# Patient Record
Sex: Female | Born: 1975 | Race: Black or African American | Hispanic: No | Marital: Married | Smoking: Never smoker
Health system: Southern US, Community
[De-identification: ages and names within clinical notes are randomized; demographics above are authoritative.]

## PROBLEM LIST (undated history)

## (undated) ENCOUNTER — Inpatient Hospital Stay (HOSPITAL_COMMUNITY): Payer: Self-pay

## (undated) DIAGNOSIS — E119 Type 2 diabetes mellitus without complications: Secondary | ICD-10-CM

## (undated) DIAGNOSIS — Z8709 Personal history of other diseases of the respiratory system: Secondary | ICD-10-CM

## (undated) DIAGNOSIS — E049 Nontoxic goiter, unspecified: Secondary | ICD-10-CM

## (undated) DIAGNOSIS — D649 Anemia, unspecified: Secondary | ICD-10-CM

## (undated) DIAGNOSIS — A6000 Herpesviral infection of urogenital system, unspecified: Secondary | ICD-10-CM

## (undated) DIAGNOSIS — K3184 Gastroparesis: Secondary | ICD-10-CM

## (undated) DIAGNOSIS — F32A Depression, unspecified: Secondary | ICD-10-CM

## (undated) DIAGNOSIS — K219 Gastro-esophageal reflux disease without esophagitis: Secondary | ICD-10-CM

## (undated) DIAGNOSIS — I10 Essential (primary) hypertension: Secondary | ICD-10-CM

## (undated) DIAGNOSIS — F319 Bipolar disorder, unspecified: Secondary | ICD-10-CM

## (undated) DIAGNOSIS — F329 Major depressive disorder, single episode, unspecified: Secondary | ICD-10-CM

## (undated) DIAGNOSIS — Z973 Presence of spectacles and contact lenses: Secondary | ICD-10-CM

## (undated) HISTORY — DX: Type 2 diabetes mellitus without complications: E11.9

## (undated) HISTORY — PX: BREAST SURGERY: SHX581

## (undated) HISTORY — DX: Bipolar disorder, unspecified: F31.9

## (undated) HISTORY — DX: Essential (primary) hypertension: I10

## (undated) HISTORY — DX: Gastro-esophageal reflux disease without esophagitis: K21.9

## (undated) HISTORY — PX: REDUCTION MAMMAPLASTY: SUR839

---
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
## 1998-01-29 ENCOUNTER — Other Ambulatory Visit: Admission: RE | Admit: 1998-01-29 | Discharge: 1998-01-29 | Payer: Self-pay | Admitting: Obstetrics and Gynecology

## 1998-08-03 HISTORY — PX: OTHER SURGICAL HISTORY: SHX169

## 1998-11-13 ENCOUNTER — Inpatient Hospital Stay (HOSPITAL_COMMUNITY): Admission: AD | Admit: 1998-11-13 | Discharge: 1998-11-13 | Payer: Self-pay | Admitting: Obstetrics and Gynecology

## 1998-11-18 ENCOUNTER — Inpatient Hospital Stay (HOSPITAL_COMMUNITY): Admission: RE | Admit: 1998-11-18 | Discharge: 1998-11-20 | Payer: Self-pay | Admitting: Obstetrics & Gynecology

## 2003-10-30 ENCOUNTER — Ambulatory Visit (HOSPITAL_COMMUNITY): Admission: RE | Admit: 2003-10-30 | Discharge: 2003-10-30 | Payer: Self-pay | Admitting: Family Medicine

## 2004-05-05 ENCOUNTER — Emergency Department (HOSPITAL_COMMUNITY): Admission: EM | Admit: 2004-05-05 | Discharge: 2004-05-05 | Payer: Self-pay | Admitting: Emergency Medicine

## 2004-08-01 ENCOUNTER — Ambulatory Visit: Payer: Self-pay | Admitting: Family Medicine

## 2004-08-23 ENCOUNTER — Emergency Department (HOSPITAL_COMMUNITY): Admission: EM | Admit: 2004-08-23 | Discharge: 2004-08-23 | Payer: Self-pay | Admitting: Emergency Medicine

## 2004-09-01 ENCOUNTER — Ambulatory Visit (HOSPITAL_COMMUNITY): Payer: Self-pay | Admitting: Psychiatry

## 2004-09-11 ENCOUNTER — Ambulatory Visit: Payer: Self-pay | Admitting: Family Medicine

## 2004-09-23 ENCOUNTER — Ambulatory Visit: Payer: Self-pay | Admitting: Family Medicine

## 2004-11-03 ENCOUNTER — Ambulatory Visit: Payer: Self-pay | Admitting: Family Medicine

## 2004-11-24 ENCOUNTER — Ambulatory Visit: Payer: Self-pay | Admitting: Family Medicine

## 2005-01-09 ENCOUNTER — Ambulatory Visit (HOSPITAL_COMMUNITY): Payer: Self-pay | Admitting: Psychiatry

## 2005-01-09 ENCOUNTER — Ambulatory Visit: Payer: Self-pay | Admitting: Psychiatry

## 2005-02-02 ENCOUNTER — Ambulatory Visit: Payer: Self-pay | Admitting: Family Medicine

## 2005-03-02 ENCOUNTER — Ambulatory Visit (HOSPITAL_COMMUNITY): Payer: Self-pay | Admitting: Psychiatry

## 2005-05-05 ENCOUNTER — Ambulatory Visit: Payer: Self-pay | Admitting: Family Medicine

## 2005-05-18 ENCOUNTER — Ambulatory Visit: Payer: Self-pay | Admitting: Family Medicine

## 2005-05-20 ENCOUNTER — Ambulatory Visit (HOSPITAL_COMMUNITY): Payer: Self-pay | Admitting: Psychiatry

## 2005-06-16 ENCOUNTER — Ambulatory Visit: Payer: Self-pay | Admitting: Family Medicine

## 2005-08-10 ENCOUNTER — Ambulatory Visit (HOSPITAL_COMMUNITY): Payer: Self-pay | Admitting: Psychiatry

## 2005-08-25 ENCOUNTER — Ambulatory Visit: Payer: Self-pay | Admitting: Family Medicine

## 2005-09-04 ENCOUNTER — Ambulatory Visit: Payer: Self-pay | Admitting: Family Medicine

## 2005-09-07 ENCOUNTER — Ambulatory Visit: Payer: Self-pay | Admitting: Family Medicine

## 2005-11-18 ENCOUNTER — Ambulatory Visit (HOSPITAL_COMMUNITY): Payer: Self-pay | Admitting: Psychiatry

## 2005-12-01 ENCOUNTER — Ambulatory Visit: Payer: Self-pay | Admitting: Family Medicine

## 2006-02-24 ENCOUNTER — Ambulatory Visit (HOSPITAL_COMMUNITY): Payer: Self-pay | Admitting: Psychiatry

## 2006-06-25 ENCOUNTER — Inpatient Hospital Stay (HOSPITAL_COMMUNITY): Admission: AD | Admit: 2006-06-25 | Discharge: 2006-06-25 | Payer: Self-pay | Admitting: Obstetrics

## 2006-07-02 ENCOUNTER — Inpatient Hospital Stay (HOSPITAL_COMMUNITY): Admission: AD | Admit: 2006-07-02 | Discharge: 2006-07-11 | Payer: Self-pay | Admitting: Obstetrics & Gynecology

## 2006-07-07 ENCOUNTER — Encounter (INDEPENDENT_AMBULATORY_CARE_PROVIDER_SITE_OTHER): Payer: Self-pay | Admitting: Specialist

## 2006-07-19 ENCOUNTER — Ambulatory Visit: Payer: Self-pay | Admitting: Family Medicine

## 2006-08-05 ENCOUNTER — Ambulatory Visit: Payer: Self-pay | Admitting: Family Medicine

## 2006-08-11 ENCOUNTER — Ambulatory Visit (HOSPITAL_COMMUNITY): Payer: Self-pay | Admitting: Psychiatry

## 2006-09-06 ENCOUNTER — Ambulatory Visit: Payer: Self-pay | Admitting: Family Medicine

## 2006-09-09 ENCOUNTER — Ambulatory Visit (HOSPITAL_COMMUNITY): Admission: RE | Admit: 2006-09-09 | Discharge: 2006-09-09 | Payer: Self-pay | Admitting: Family Medicine

## 2006-09-09 ENCOUNTER — Ambulatory Visit: Payer: Self-pay | Admitting: Family Medicine

## 2006-09-15 ENCOUNTER — Ambulatory Visit (HOSPITAL_COMMUNITY): Payer: Self-pay | Admitting: Psychiatry

## 2006-10-06 ENCOUNTER — Ambulatory Visit (HOSPITAL_COMMUNITY): Payer: Self-pay | Admitting: Psychiatry

## 2006-10-18 ENCOUNTER — Ambulatory Visit: Payer: Self-pay | Admitting: Family Medicine

## 2006-10-18 LAB — CONVERTED CEMR LAB
BUN: 12 mg/dL (ref 6–23)
Basophils Absolute: 0 10*3/uL (ref 0.0–0.1)
Basophils Relative: 0 % (ref 0–1)
CO2: 25 meq/L (ref 19–32)
Calcium: 10 mg/dL (ref 8.4–10.5)
Chloride: 99 meq/L (ref 96–112)
Creatinine, Ser: 0.81 mg/dL (ref 0.40–1.20)
Eosinophils Absolute: 0.1 10*3/uL (ref 0.0–0.7)
Eosinophils Relative: 1 % (ref 0–5)
Ferritin: 8 ng/mL — ABNORMAL LOW (ref 10–291)
Folate: >20 ng/mL
Glucose, Bld: 134 mg/dL — ABNORMAL HIGH (ref 70–99)
HCT: 37.9 % (ref 36.0–46.0)
Hemoglobin: 12.6 g/dL (ref 12.0–15.0)
Hgb A1c MFr Bld: 7.7 % — ABNORMAL HIGH (ref 4.6–6.1)
Iron: 207 ug/dL — ABNORMAL HIGH (ref 42–145)
Lymphocytes Relative: 48 % — ABNORMAL HIGH (ref 12–46)
Lymphs Abs: 2.5 10*3/uL (ref 0.7–3.3)
MCHC: 33.2 g/dL (ref 30.0–36.0)
MCV: 89.6 fL (ref 78.0–100.0)
Monocytes Absolute: 0.3 10*3/uL (ref 0.2–0.7)
Monocytes Relative: 6 % (ref 3–11)
Neutro Abs: 2.4 10*3/uL (ref 1.7–7.7)
Neutrophils Relative %: 45 % (ref 43–77)
Pap Smear: NORMAL
Platelets: 443 10*3/uL — ABNORMAL HIGH (ref 150–400)
Potassium: 4.1 meq/L (ref 3.5–5.3)
RBC: 4.23 M/uL (ref 3.87–5.11)
RDW: 15.3 % — ABNORMAL HIGH (ref 11.5–14.0)
Retic Count, Absolute: 42.3 (ref 19.0–186.0)
Retic Ct Pct: 1 % (ref 0.4–3.1)
Saturation Ratios: 34 % (ref 20–55)
Sodium: 138 meq/L (ref 135–145)
TIBC: 616 ug/dL — ABNORMAL HIGH (ref 250–470)
UIBC: 409 ug/dL
Vitamin B-12: 365 pg/mL (ref 211–911)
WBC: 5.3 10*3/uL (ref 4.0–10.5)

## 2006-11-17 ENCOUNTER — Ambulatory Visit (HOSPITAL_COMMUNITY): Payer: Self-pay | Admitting: Psychiatry

## 2006-11-24 ENCOUNTER — Ambulatory Visit: Payer: Self-pay | Admitting: Family Medicine

## 2006-11-25 ENCOUNTER — Encounter: Payer: Self-pay | Admitting: Family Medicine

## 2006-12-13 ENCOUNTER — Ambulatory Visit: Payer: Self-pay | Admitting: Family Medicine

## 2006-12-20 ENCOUNTER — Ambulatory Visit: Payer: Self-pay | Admitting: Family Medicine

## 2006-12-28 ENCOUNTER — Ambulatory Visit: Payer: Self-pay | Admitting: Family Medicine

## 2007-01-03 ENCOUNTER — Ambulatory Visit: Payer: Self-pay | Admitting: Family Medicine

## 2007-01-17 ENCOUNTER — Ambulatory Visit: Payer: Self-pay | Admitting: Family Medicine

## 2007-01-18 ENCOUNTER — Encounter: Payer: Self-pay | Admitting: Family Medicine

## 2007-01-18 LAB — CONVERTED CEMR LAB
BUN: 11 mg/dL (ref 6–23)
Basophils Absolute: 0 10*3/uL (ref 0.0–0.1)
Basophils Relative: 0 % (ref 0–1)
CO2: 25 meq/L (ref 19–32)
Calcium: 9.9 mg/dL (ref 8.4–10.5)
Chloride: 100 meq/L (ref 96–112)
Creatinine, Ser: 0.92 mg/dL (ref 0.40–1.20)
Eosinophils Absolute: 0.1 10*3/uL (ref 0.0–0.7)
Eosinophils Relative: 1 % (ref 0–5)
Ferritin: 5 ng/mL — ABNORMAL LOW (ref 10–291)
Folate: >20 ng/mL
Glucose, Bld: 91 mg/dL (ref 70–99)
HCT: 39.3 % (ref 36.0–46.0)
Hemoglobin: 12.4 g/dL (ref 12.0–15.0)
Hgb A1c MFr Bld: 6.3 % — ABNORMAL HIGH (ref 4.6–6.1)
Iron: 106 ug/dL (ref 42–145)
Lymphocytes Relative: 46 % (ref 12–46)
Lymphs Abs: 2.3 10*3/uL (ref 0.7–3.3)
MCHC: 31.6 g/dL (ref 30.0–36.0)
MCV: 96.3 fL (ref 78.0–100.0)
Monocytes Absolute: 0.3 10*3/uL (ref 0.2–0.7)
Monocytes Relative: 5 % (ref 3–11)
Neutro Abs: 2.4 10*3/uL (ref 1.7–7.7)
Neutrophils Relative %: 47 % (ref 43–77)
Platelets: 362 10*3/uL (ref 150–400)
Potassium: 4 meq/L (ref 3.5–5.3)
RBC: 4.08 M/uL (ref 3.87–5.11)
RDW: 14.3 % — ABNORMAL HIGH (ref 11.5–14.0)
Retic Count, Absolute: 44.9 (ref 19.0–186.0)
Retic Ct Pct: 1.1 % (ref 0.4–3.1)
Saturation Ratios: 20 % (ref 20–55)
Sodium: 138 meq/L (ref 135–145)
TIBC: 525 ug/dL — ABNORMAL HIGH (ref 250–470)
UIBC: 419 ug/dL
Vitamin B-12: 289 pg/mL (ref 211–911)
WBC: 5 10*3/uL (ref 4.0–10.5)

## 2007-01-19 ENCOUNTER — Ambulatory Visit (HOSPITAL_COMMUNITY): Payer: Self-pay | Admitting: Psychiatry

## 2007-02-03 ENCOUNTER — Ambulatory Visit: Payer: Self-pay | Admitting: Family Medicine

## 2007-02-14 ENCOUNTER — Ambulatory Visit: Payer: Self-pay | Admitting: Family Medicine

## 2007-02-21 ENCOUNTER — Ambulatory Visit: Payer: Self-pay | Admitting: Family Medicine

## 2007-04-13 ENCOUNTER — Ambulatory Visit (HOSPITAL_COMMUNITY): Payer: Self-pay | Admitting: Psychiatry

## 2007-04-29 ENCOUNTER — Ambulatory Visit: Payer: Self-pay | Admitting: Family Medicine

## 2007-05-11 ENCOUNTER — Ambulatory Visit: Payer: Self-pay | Admitting: Family Medicine

## 2007-07-12 ENCOUNTER — Ambulatory Visit: Payer: Self-pay | Admitting: Family Medicine

## 2007-08-22 ENCOUNTER — Ambulatory Visit: Payer: Self-pay | Admitting: Family Medicine

## 2007-09-16 ENCOUNTER — Ambulatory Visit (HOSPITAL_COMMUNITY): Payer: Self-pay | Admitting: Psychiatry

## 2007-10-06 ENCOUNTER — Ambulatory Visit: Payer: Self-pay | Admitting: Family Medicine

## 2007-10-08 ENCOUNTER — Encounter: Payer: Self-pay | Admitting: Family Medicine

## 2007-10-08 LAB — CONVERTED CEMR LAB
BUN: 9 mg/dL (ref 6–23)
CO2: 23 meq/L (ref 19–32)
Calcium: 9.1 mg/dL (ref 8.4–10.5)
Chloride: 105 meq/L (ref 96–112)
Cholesterol: 110 mg/dL (ref 0–200)
Creatinine, Ser: 1.14 mg/dL (ref 0.40–1.20)
Glucose, Bld: 96 mg/dL (ref 70–99)
HDL: 51 mg/dL (ref >39)
LDL Cholesterol: 40 mg/dL (ref 0–99)
Potassium: 4.2 meq/L (ref 3.5–5.3)
Sodium: 140 meq/L (ref 135–145)
Total CHOL/HDL Ratio: 2.2
Triglycerides: 95 mg/dL (ref <150)
VLDL: 19 mg/dL (ref 0–40)

## 2007-12-12 ENCOUNTER — Ambulatory Visit (HOSPITAL_COMMUNITY): Payer: Self-pay | Admitting: Psychiatry

## 2008-01-05 ENCOUNTER — Encounter: Payer: Self-pay | Admitting: Family Medicine

## 2008-01-05 DIAGNOSIS — F319 Bipolar disorder, unspecified: Secondary | ICD-10-CM | POA: Insufficient documentation

## 2008-01-05 DIAGNOSIS — I10 Essential (primary) hypertension: Secondary | ICD-10-CM | POA: Insufficient documentation

## 2008-01-05 DIAGNOSIS — O99210 Obesity complicating pregnancy, unspecified trimester: Secondary | ICD-10-CM | POA: Insufficient documentation

## 2008-01-09 ENCOUNTER — Ambulatory Visit: Payer: Self-pay | Admitting: Family Medicine

## 2008-01-10 ENCOUNTER — Encounter: Payer: Self-pay | Admitting: Family Medicine

## 2008-01-10 LAB — CONVERTED CEMR LAB: Microalb, Ur: 17.7 mg/dL — ABNORMAL HIGH (ref 0.00–1.89)

## 2008-01-19 ENCOUNTER — Ambulatory Visit (HOSPITAL_COMMUNITY): Admission: RE | Admit: 2008-01-19 | Discharge: 2008-01-19 | Payer: Self-pay | Admitting: Family Medicine

## 2008-04-12 ENCOUNTER — Ambulatory Visit: Payer: Self-pay | Admitting: Family Medicine

## 2008-04-23 ENCOUNTER — Ambulatory Visit (HOSPITAL_COMMUNITY): Payer: Self-pay | Admitting: Psychiatry

## 2008-06-18 ENCOUNTER — Ambulatory Visit (HOSPITAL_COMMUNITY): Payer: Self-pay | Admitting: Psychiatry

## 2008-07-04 ENCOUNTER — Ambulatory Visit: Payer: Self-pay | Admitting: Family Medicine

## 2008-07-04 LAB — CONVERTED CEMR LAB
Glucose, Bld: 102 mg/dL
Hgb A1c MFr Bld: 6.2 %

## 2008-08-20 ENCOUNTER — Encounter: Payer: Self-pay | Admitting: Family Medicine

## 2008-08-27 ENCOUNTER — Ambulatory Visit (HOSPITAL_COMMUNITY): Payer: Self-pay | Admitting: Psychiatry

## 2008-08-30 ENCOUNTER — Encounter: Payer: Self-pay | Admitting: Family Medicine

## 2008-08-30 ENCOUNTER — Telehealth: Payer: Self-pay | Admitting: Family Medicine

## 2008-09-13 ENCOUNTER — Ambulatory Visit: Payer: Self-pay | Admitting: Family Medicine

## 2008-09-13 LAB — CONVERTED CEMR LAB: Glucose, Bld: 126 mg/dL

## 2008-09-18 ENCOUNTER — Telehealth: Payer: Self-pay | Admitting: Family Medicine

## 2008-09-19 ENCOUNTER — Ambulatory Visit: Payer: Self-pay | Admitting: Family Medicine

## 2008-09-21 ENCOUNTER — Ambulatory Visit (HOSPITAL_COMMUNITY): Admission: RE | Admit: 2008-09-21 | Discharge: 2008-09-21 | Payer: Self-pay | Admitting: Family Medicine

## 2008-09-27 ENCOUNTER — Encounter (HOSPITAL_COMMUNITY): Admission: RE | Admit: 2008-09-27 | Discharge: 2008-10-27 | Payer: Self-pay | Admitting: Family Medicine

## 2008-10-13 ENCOUNTER — Encounter: Payer: Self-pay | Admitting: Family Medicine

## 2008-10-13 LAB — CONVERTED CEMR LAB
ALT: 11 units/L (ref 0–35)
AST: 11 units/L (ref 0–37)
Albumin: 4.6 g/dL (ref 3.5–5.2)
Alkaline Phosphatase: 51 units/L (ref 39–117)
BUN: 11 mg/dL (ref 6–23)
Band Neutrophils: 0 % (ref 0–10)
Basophils Absolute: 0 10*3/uL (ref 0.0–0.1)
Basophils Relative: 0 % (ref 0–1)
Bilirubin, Direct: 0.1 mg/dL (ref 0.0–0.3)
CO2: 25 meq/L (ref 19–32)
Calcium: 9.4 mg/dL (ref 8.4–10.5)
Chloride: 103 meq/L (ref 96–112)
Cholesterol: 124 mg/dL (ref 0–200)
Creatinine, Ser: 1.05 mg/dL (ref 0.40–1.20)
Eosinophils Absolute: 0.1 10*3/uL (ref 0.0–0.7)
Eosinophils Relative: 3 % (ref 0–5)
Glucose, Bld: 121 mg/dL — ABNORMAL HIGH (ref 70–99)
HCT: 35.6 % — ABNORMAL LOW (ref 36.0–46.0)
HDL: 56 mg/dL (ref >39)
Hemoglobin: 12.1 g/dL (ref 12.0–15.0)
Indirect Bilirubin: 0.3 mg/dL (ref 0.0–0.9)
LDL Cholesterol: 45 mg/dL (ref 0–99)
Lymphocytes Relative: 43 % (ref 12–46)
Lymphs Abs: 1.9 10*3/uL (ref 0.7–4.0)
MCHC: 34 g/dL (ref 30.0–36.0)
MCV: 93.9 fL (ref 78.0–100.0)
Monocytes Absolute: 0.3 10*3/uL (ref 0.1–1.0)
Monocytes Relative: 8 % (ref 3–12)
Neutro Abs: 2 10*3/uL (ref 1.7–7.7)
Neutrophils Relative %: 47 % (ref 43–77)
Platelets: 360 10*3/uL (ref 150–400)
Potassium: 3.9 meq/L (ref 3.5–5.3)
RBC: 3.79 M/uL — ABNORMAL LOW (ref 3.87–5.11)
RDW: 13.4 % (ref 11.5–15.5)
Sodium: 138 meq/L (ref 135–145)
TSH: 1.44 microintl units/mL (ref 0.350–4.500)
Total Bilirubin: 0.4 mg/dL (ref 0.3–1.2)
Total CHOL/HDL Ratio: 2.2
Total Protein: 7.2 g/dL (ref 6.0–8.3)
Triglycerides: 116 mg/dL (ref <150)
VLDL: 23 mg/dL (ref 0–40)
WBC: 4.3 10*3/uL (ref 4.0–10.5)

## 2008-10-18 ENCOUNTER — Ambulatory Visit: Payer: Self-pay | Admitting: Family Medicine

## 2008-10-18 LAB — CONVERTED CEMR LAB
Glucose, Bld: 148 mg/dL
Hgb A1c MFr Bld: 7.1 %

## 2008-11-19 ENCOUNTER — Ambulatory Visit (HOSPITAL_COMMUNITY): Payer: Self-pay | Admitting: Psychiatry

## 2008-11-29 ENCOUNTER — Ambulatory Visit: Payer: Self-pay | Admitting: Family Medicine

## 2008-11-29 LAB — CONVERTED CEMR LAB: Glucose, Bld: 156 mg/dL

## 2008-12-04 LAB — CONVERTED CEMR LAB: Helicobacter Pylori Antibody-IgG: <0.4

## 2009-01-23 ENCOUNTER — Ambulatory Visit: Payer: Self-pay | Admitting: Family Medicine

## 2009-01-23 LAB — CONVERTED CEMR LAB
Glucose, Bld: 101 mg/dL
Hgb A1c MFr Bld: 6.4 %

## 2009-01-25 ENCOUNTER — Encounter: Payer: Self-pay | Admitting: Family Medicine

## 2009-01-25 LAB — CONVERTED CEMR LAB
Creatinine, Urine: 233.8 mg/dL
Microalb Creat Ratio: 18.9 mg/g (ref 0.0–30.0)
Microalb, Ur: 4.42 mg/dL — ABNORMAL HIGH (ref 0.00–1.89)

## 2009-01-27 DIAGNOSIS — K219 Gastro-esophageal reflux disease without esophagitis: Secondary | ICD-10-CM | POA: Insufficient documentation

## 2009-02-06 ENCOUNTER — Ambulatory Visit: Payer: Self-pay | Admitting: Family Medicine

## 2009-02-25 ENCOUNTER — Ambulatory Visit: Payer: Self-pay | Admitting: Family Medicine

## 2009-02-25 LAB — CONVERTED CEMR LAB
Basophils Absolute: 0 10*3/uL (ref 0.0–0.1)
Basophils Relative: 0 % (ref 0–1)
Eosinophils Absolute: 0.1 10*3/uL (ref 0.0–0.7)
Eosinophils Relative: 1 % (ref 0–5)
Glucose, Bld: 134 mg/dL
HCT: 38.3 % (ref 36.0–46.0)
Hemoglobin: 13.2 g/dL (ref 12.0–15.0)
Lymphocytes Relative: 17 % (ref 12–46)
Lymphs Abs: 1.5 10*3/uL (ref 0.7–4.0)
MCHC: 34.6 g/dL (ref 30.0–36.0)
MCV: 95.5 fL (ref 78.0–100.0)
Monocytes Absolute: 0.7 10*3/uL (ref 0.1–1.0)
Monocytes Relative: 8 % (ref 3–12)
Neutro Abs: 6.4 10*3/uL (ref 1.7–7.7)
Neutrophils Relative %: 74 % (ref 43–77)
Platelets: 370 10*3/uL (ref 150–400)
RBC: 4.01 M/uL (ref 3.87–5.11)
RDW: 13.6 % (ref 11.5–15.5)
WBC: 8.7 10*3/uL (ref 4.0–10.5)

## 2009-02-26 ENCOUNTER — Telehealth: Payer: Self-pay | Admitting: Family Medicine

## 2009-02-28 ENCOUNTER — Telehealth: Payer: Self-pay | Admitting: Family Medicine

## 2009-03-01 ENCOUNTER — Ambulatory Visit (HOSPITAL_COMMUNITY): Admission: RE | Admit: 2009-03-01 | Discharge: 2009-03-01 | Payer: Self-pay | Admitting: Family Medicine

## 2009-04-19 ENCOUNTER — Ambulatory Visit (HOSPITAL_COMMUNITY): Payer: Self-pay | Admitting: Psychiatry

## 2009-04-30 ENCOUNTER — Ambulatory Visit: Payer: Self-pay | Admitting: Family Medicine

## 2009-04-30 LAB — CONVERTED CEMR LAB
Glucose, Bld: 128 mg/dL
Hgb A1c MFr Bld: 6.6 %

## 2009-05-08 ENCOUNTER — Encounter (INDEPENDENT_AMBULATORY_CARE_PROVIDER_SITE_OTHER): Payer: Self-pay | Admitting: *Deleted

## 2009-05-20 ENCOUNTER — Encounter: Payer: Self-pay | Admitting: Family Medicine

## 2009-06-03 ENCOUNTER — Ambulatory Visit: Payer: Self-pay | Admitting: Gastroenterology

## 2009-06-03 DIAGNOSIS — R1319 Other dysphagia: Secondary | ICD-10-CM | POA: Insufficient documentation

## 2009-06-03 DIAGNOSIS — N912 Amenorrhea, unspecified: Secondary | ICD-10-CM | POA: Insufficient documentation

## 2009-06-10 ENCOUNTER — Ambulatory Visit: Payer: Self-pay | Admitting: Internal Medicine

## 2009-07-01 ENCOUNTER — Ambulatory Visit: Payer: Self-pay | Admitting: Gastroenterology

## 2009-07-01 ENCOUNTER — Ambulatory Visit (HOSPITAL_COMMUNITY): Admission: RE | Admit: 2009-07-01 | Discharge: 2009-07-01 | Payer: Self-pay | Admitting: Gastroenterology

## 2009-07-01 HISTORY — PX: ESOPHAGOGASTRODUODENOSCOPY: SHX1529

## 2009-07-10 ENCOUNTER — Encounter: Payer: Self-pay | Admitting: Gastroenterology

## 2009-07-10 ENCOUNTER — Encounter: Payer: Self-pay | Admitting: Internal Medicine

## 2009-07-16 ENCOUNTER — Encounter (HOSPITAL_COMMUNITY): Admission: RE | Admit: 2009-07-16 | Discharge: 2009-07-31 | Payer: Self-pay | Admitting: Gastroenterology

## 2009-07-24 ENCOUNTER — Telehealth: Payer: Self-pay | Admitting: Gastroenterology

## 2009-08-09 ENCOUNTER — Telehealth (INDEPENDENT_AMBULATORY_CARE_PROVIDER_SITE_OTHER): Payer: Self-pay

## 2009-08-15 ENCOUNTER — Encounter: Payer: Self-pay | Admitting: Internal Medicine

## 2009-09-02 ENCOUNTER — Ambulatory Visit: Payer: Self-pay | Admitting: Family Medicine

## 2009-09-02 ENCOUNTER — Ambulatory Visit: Payer: Self-pay | Admitting: Gastroenterology

## 2009-09-02 DIAGNOSIS — K3184 Gastroparesis: Secondary | ICD-10-CM | POA: Insufficient documentation

## 2009-09-02 LAB — CONVERTED CEMR LAB
Glucose, Bld: 100 mg/dL
Hgb A1c MFr Bld: 6.4 %

## 2009-09-03 ENCOUNTER — Telehealth: Payer: Self-pay | Admitting: Family Medicine

## 2009-09-04 ENCOUNTER — Telehealth: Payer: Self-pay | Admitting: Family Medicine

## 2009-09-11 ENCOUNTER — Encounter: Payer: Self-pay | Admitting: Family Medicine

## 2009-09-13 ENCOUNTER — Telehealth: Payer: Self-pay | Admitting: Family Medicine

## 2009-09-13 ENCOUNTER — Emergency Department (HOSPITAL_COMMUNITY): Admission: EM | Admit: 2009-09-13 | Discharge: 2009-09-13 | Payer: Self-pay | Admitting: Emergency Medicine

## 2009-09-16 ENCOUNTER — Ambulatory Visit: Payer: Self-pay | Admitting: Family Medicine

## 2009-09-16 DIAGNOSIS — M5380 Other specified dorsopathies, site unspecified: Secondary | ICD-10-CM | POA: Insufficient documentation

## 2009-09-16 DIAGNOSIS — S139XXA Sprain of joints and ligaments of unspecified parts of neck, initial encounter: Secondary | ICD-10-CM | POA: Insufficient documentation

## 2009-09-17 ENCOUNTER — Telehealth: Payer: Self-pay | Admitting: Family Medicine

## 2009-09-19 ENCOUNTER — Encounter: Payer: Self-pay | Admitting: Family Medicine

## 2009-09-24 ENCOUNTER — Telehealth: Payer: Self-pay | Admitting: Physician Assistant

## 2009-09-24 ENCOUNTER — Encounter: Payer: Self-pay | Admitting: Family Medicine

## 2009-09-25 ENCOUNTER — Encounter (HOSPITAL_COMMUNITY): Admission: RE | Admit: 2009-09-25 | Discharge: 2009-10-25 | Payer: Self-pay | Admitting: Sports Medicine

## 2009-09-27 ENCOUNTER — Encounter: Payer: Self-pay | Admitting: Family Medicine

## 2009-10-01 ENCOUNTER — Ambulatory Visit: Payer: Self-pay | Admitting: Family Medicine

## 2009-10-01 DIAGNOSIS — T7840XA Allergy, unspecified, initial encounter: Secondary | ICD-10-CM | POA: Insufficient documentation

## 2009-10-01 DIAGNOSIS — J329 Chronic sinusitis, unspecified: Secondary | ICD-10-CM | POA: Insufficient documentation

## 2009-10-02 ENCOUNTER — Ambulatory Visit (HOSPITAL_COMMUNITY): Payer: Self-pay | Admitting: Psychiatry

## 2009-10-28 ENCOUNTER — Encounter (HOSPITAL_COMMUNITY): Admission: RE | Admit: 2009-10-28 | Discharge: 2009-11-27 | Payer: Self-pay | Admitting: Sports Medicine

## 2009-10-31 ENCOUNTER — Encounter: Payer: Self-pay | Admitting: Gastroenterology

## 2009-12-02 ENCOUNTER — Telehealth: Payer: Self-pay | Admitting: Family Medicine

## 2009-12-04 LAB — CONVERTED CEMR LAB
BUN: 10 mg/dL (ref 6–23)
CO2: 26 meq/L (ref 19–32)
Calcium: 9.5 mg/dL (ref 8.4–10.5)
Chloride: 103 meq/L (ref 96–112)
Cholesterol: 152 mg/dL (ref 0–200)
Creatinine, Ser: 1.01 mg/dL (ref 0.40–1.20)
Glucose, Bld: 118 mg/dL — ABNORMAL HIGH (ref 70–99)
HDL: 66 mg/dL (ref >39)
LDL Cholesterol: 74 mg/dL (ref 0–99)
Potassium: 4.1 meq/L (ref 3.5–5.3)
Sodium: 140 meq/L (ref 135–145)
TSH: 1.15 microintl units/mL (ref 0.350–4.500)
Total CHOL/HDL Ratio: 2.3
Triglycerides: 60 mg/dL (ref <150)
VLDL: 12 mg/dL (ref 0–40)

## 2009-12-23 ENCOUNTER — Ambulatory Visit: Payer: Self-pay | Admitting: Internal Medicine

## 2010-01-08 ENCOUNTER — Telehealth: Payer: Self-pay | Admitting: Family Medicine

## 2010-01-28 ENCOUNTER — Ambulatory Visit: Payer: Self-pay | Admitting: Family Medicine

## 2010-01-31 ENCOUNTER — Ambulatory Visit (HOSPITAL_COMMUNITY): Admission: RE | Admit: 2010-01-31 | Discharge: 2010-01-31 | Payer: Self-pay | Admitting: Family Medicine

## 2010-02-05 ENCOUNTER — Ambulatory Visit (HOSPITAL_COMMUNITY): Payer: Self-pay | Admitting: Psychiatry

## 2010-02-18 ENCOUNTER — Ambulatory Visit: Payer: Self-pay | Admitting: Family Medicine

## 2010-02-18 DIAGNOSIS — R5381 Other malaise: Secondary | ICD-10-CM | POA: Insufficient documentation

## 2010-02-18 DIAGNOSIS — R5383 Other fatigue: Secondary | ICD-10-CM

## 2010-02-19 LAB — CONVERTED CEMR LAB: Hgb A1c MFr Bld: 6.8 % — ABNORMAL HIGH (ref <5.7)

## 2010-03-03 ENCOUNTER — Encounter: Payer: Self-pay | Admitting: Family Medicine

## 2010-04-02 ENCOUNTER — Encounter: Payer: Self-pay | Admitting: Family Medicine

## 2010-05-09 ENCOUNTER — Ambulatory Visit (HOSPITAL_COMMUNITY): Payer: Self-pay | Admitting: Psychiatry

## 2010-05-22 ENCOUNTER — Encounter (INDEPENDENT_AMBULATORY_CARE_PROVIDER_SITE_OTHER): Payer: Self-pay | Admitting: *Deleted

## 2010-05-26 ENCOUNTER — Encounter: Payer: Self-pay | Admitting: Family Medicine

## 2010-05-29 ENCOUNTER — Encounter: Payer: Self-pay | Admitting: Family Medicine

## 2010-05-30 ENCOUNTER — Encounter: Payer: Self-pay | Admitting: Family Medicine

## 2010-06-17 ENCOUNTER — Ambulatory Visit: Payer: Self-pay | Admitting: Gastroenterology

## 2010-06-17 LAB — CONVERTED CEMR LAB
BUN: 11 mg/dL (ref 6–23)
Basophils Absolute: 0 10*3/uL (ref 0.0–0.1)
Basophils Relative: 1 % (ref 0–1)
CO2: 28 meq/L (ref 19–32)
Calcium: 10.4 mg/dL (ref 8.4–10.5)
Chloride: 101 meq/L (ref 96–112)
Creatinine, Ser: 1.02 mg/dL (ref 0.40–1.20)
Eosinophils Absolute: 0.1 10*3/uL (ref 0.0–0.7)
Eosinophils Relative: 1 % (ref 0–5)
Glucose, Bld: 105 mg/dL — ABNORMAL HIGH (ref 70–99)
HCT: 36 % (ref 36.0–46.0)
Hemoglobin: 11.6 g/dL — ABNORMAL LOW (ref 12.0–15.0)
Hgb A1c MFr Bld: 6.4 % — ABNORMAL HIGH (ref <5.7)
Lymphocytes Relative: 52 % — ABNORMAL HIGH (ref 12–46)
Lymphs Abs: 2.3 10*3/uL (ref 0.7–4.0)
MCHC: 32.2 g/dL (ref 30.0–36.0)
MCV: 94 fL (ref 78.0–100.0)
Monocytes Absolute: 0.3 10*3/uL (ref 0.1–1.0)
Monocytes Relative: 6 % (ref 3–12)
Neutro Abs: 1.7 10*3/uL (ref 1.7–7.7)
Neutrophils Relative %: 40 % — ABNORMAL LOW (ref 43–77)
Platelets: 389 10*3/uL (ref 150–400)
Potassium: 4.2 meq/L (ref 3.5–5.3)
RBC: 3.83 M/uL — ABNORMAL LOW (ref 3.87–5.11)
RDW: 14.5 % (ref 11.5–15.5)
Sodium: 139 meq/L (ref 135–145)
WBC: 4.4 10*3/uL (ref 4.0–10.5)

## 2010-06-20 ENCOUNTER — Ambulatory Visit: Payer: Self-pay | Admitting: Family Medicine

## 2010-06-20 DIAGNOSIS — E049 Nontoxic goiter, unspecified: Secondary | ICD-10-CM | POA: Insufficient documentation

## 2010-06-24 ENCOUNTER — Telehealth: Payer: Self-pay | Admitting: Family Medicine

## 2010-06-24 ENCOUNTER — Ambulatory Visit (HOSPITAL_COMMUNITY)
Admission: RE | Admit: 2010-06-24 | Discharge: 2010-06-24 | Payer: Self-pay | Source: Home / Self Care | Admitting: Family Medicine

## 2010-06-25 ENCOUNTER — Encounter: Payer: Self-pay | Admitting: Family Medicine

## 2010-07-07 ENCOUNTER — Ambulatory Visit (HOSPITAL_COMMUNITY): Payer: Self-pay | Admitting: Psychiatry

## 2010-07-10 ENCOUNTER — Telehealth: Payer: Self-pay | Admitting: Family Medicine

## 2010-09-03 ENCOUNTER — Ambulatory Visit (HOSPITAL_COMMUNITY): Admit: 2010-09-03 | Payer: Self-pay | Admitting: Psychiatry

## 2010-09-03 ENCOUNTER — Encounter (HOSPITAL_COMMUNITY): Payer: Self-pay | Admitting: Psychiatry

## 2010-09-04 NOTE — Assessment & Plan Note (Signed)
Summary: GASTROPARESIS, GERD   Visit Type:  Follow-up Visit Primary Care Provider:  Lodema Hong, M.D.  Chief Complaint:  follow up- still vomiting.  History of Present Illness: Vomiting more, but not following her diet. No heartburn or indigestion. OMP takes care of her Sx. Taking Reglan 1 or 2 times a day. Lost 3-4 lbs. Nausea: 5 out of 7 days, hours, can keep her awake.   Current Medications (verified): 1)  Metformin Hcl 1000 Mg  Tabs (Metformin Hcl) .... One Tab By Mouth Two Times A Day 2)  Abilify 10 Mg Tabs (Aripiprazole) .... One Tab By Mouth Qhs 3)  Wellbutrin Xl 300 Mg Xr24h-Tab (Bupropion Hcl) .... One Tab By Mouth Every Morning 4)  Labetalol Hcl 300 Mg Tabs (Labetalol Hcl) .... Take 2 Tabs By Mouth Two Times A Day 5)  Glipizide 5 Mg Xr24h-Tab (Glipizide) .... One Tab By Mouth Qd 6)  Omeprazole 40 Mg Cpdr (Omeprazole) .... Take 1 Capsule By Mouth Once A Day 7)  Reglan 5 Mg Tabs (Metoclopramide Hcl) .Marland Kitchen.. 1 By Mouth 30 Minute Prior To Breakfast and Lunch 8)  Inh .... Once Daily 9)  Hydrochlorothiazide 25 Mg Tabs (Hydrochlorothiazide) .... Once Daily  Allergies (verified): 1)  ! Augmentin  Past History:  Past Medical History: Last updated: 02/18/2010 Current Problems:  BIPOLAR DISORDER UNSPECIFIED (ICD-296.80) OBESITY (ICD-278.00) HYPERTENSION (ICD-401.9) DIABETES MELLITUS, TYPE I (ICD-250.01) GERD POSITIVE PPD , will start treatment in July 2011  Past Surgical History: Last updated: 01/05/2008 Removal of dermoid tumor (2000) Breast reduction (1994)  Family History: Last updated: 06/03/2009 Mom fibromyalgie HTN, Dm Dad HTN,DM siblings 0 Second cousin, colon cancer, over age 53 No FH of liver dz, IBD, chronic GI illnesses.  Social History: got laid off Married 1 child, 3 yo Never Smoked Alcohol use-no Drug use-no  Vital Signs:  Patient profile:   35 year old female Menstrual status:  regular Height:      65.5 inches Weight:      219 pounds BMI:      36.02 Temp:     98.3 degrees F oral Pulse rate:   76 / minute BP sitting:   118 / 84  (left arm) Cuff size:   regular  Vitals Entered By: Hendricks Limes LPN (June 17, 2010 8:42 AM)  Physical Exam  General:  Well developed, well nourished, no acute distress. Head:  Normocephalic and atraumatic. Lungs:  Clear throughout to auscultation. Heart:  Regular rate and rhythm; no murmurs. Abdomen:  Soft, nontender and nondistended.  Normal bowel sounds. obese.    Impression & Recommendations:  Problem # 1:  GASTROPARESIS (ICD-536.3) Assessment Improved Eat right. Lose weight. TAke your meds. Reglan refilled x1 year.  Problem # 2:  GERD (ICD-530.81) Assessment: Improved Continue OMP. Eat right. Lose weight. Take meds. OMP, rfx12. OPV in 6 mos.  CC: PCP Prescriptions: REGLAN 5 MG TABS (METOCLOPRAMIDE HCL) 1 by mouth 30 minute prior to breakfast and lunch  #60 x 11   Entered and Authorized by:   West Bali MD   Signed by:   West Bali MD on 06/17/2010   Method used:   Electronically to        [facility] [address] [phone]* (retail)       [address] [place], [place]  [address]       Ph: [phone]       Fax: [phone]   RxID:   [id number] OMEPRAZOLE 40 MG CPDR (OMEPRAZOLE) Take  1 capsule by mouth once a day  #30 x 11   Entered and Authorized by:   West Bali MD   Signed by:   West Bali MD on 06/17/2010   Method used:   Electronically to        [facility] [phone]* (retail)       [address] [place], [place]  [address]       Ph: [phone]       Fax: [phone]   RxID:   [id number]   Appended Document: GASTROPARESIS, GERD 6 MONTH REMINDER IS IN THE COMPUTER  Appended Document: Orders Update    Clinical Lists Changes  Orders: Added new Service order of Est. Patient Level III (44010) - Signed

## 2010-09-04 NOTE — Assessment & Plan Note (Signed)
Summary: nausea,change in bowel habits/ams   Visit Type:  Consult Primary Care Provider:  Syliva Overman, MD  Chief Complaint:  nausea and change in bm- diarrhea.  History of Present Illness: [name] is a pleasant 35 year old lady, referred by Dr. Syliva Overman, for further evaluation of chronic nausea and vomiting and change in bowel movements.  In February of this year she started having nausea and vomiting. At first she thought she had a stomach virus. The symptoms continued however. Every day she vomits at least 2-3 times. She denies any medication changes around that time. Denies any abdominal pain, just states it is tight. She has chronic heartburn ever since she was a child.  She was started on omeprazole when the vomiting began. The omeprazole helps the heartburn but not the vomiting. She states she's lost 15 pounds over the course of last 8 months. She states food just keeps coming back up. Her bowel movements have changed as well. She used to have one stool every 2-3 days, now she has 2-3 loose stools daily. She denies any blood in the stools or black stools. She has difficulty swallowing certain pills, labetalol makes her gag. She has some vague difficulty swallowing certain foods at certain times.  Abdominal ultrasound and HIDA scan were unremarkable. LFTs in March were normal. CBC in July was normal. H. pylori serologies were negative.    Current Medications (verified): 1)  Metformin Hcl 1000 Mg  Tabs (Metformin Hcl) .... One Tab By Mouth Two Times A Day 2)  Abilify 10 Mg Tabs (Aripiprazole) .... One Tab By Mouth Qhs 3)  Wellbutrin Xl 300 Mg Xr24h-Tab (Bupropion Hcl) .... One Tab By Mouth Every Morning 4)  Labetalol Hcl 300 Mg Tabs (Labetalol Hcl) .... Take 2 Tabs By Mouth Two Times A Day 5)  Glipizide 5 Mg Xr24h-Tab (Glipizide) .... One Tab By Mouth Qd 6)  Portia-28 0.15-0.03 Mg Tabs (Levonorgestrel-Ethinyl Estrad) .... Take 1 Tablet By Mouth Once A Day 7)   Hydrochlorothiazide 25 Mg Tabs (Hydrochlorothiazide) .... One Tab By Mouth Qd 8)  Omeprazole 40 Mg Cpdr (Omeprazole) .... Take 1 Capsule By Mouth Once A Day 9)  Cardura 1 Mg Tabs (Doxazosin Mesylate) .... 2 At Bedtime  Allergies (verified): 1)  ! Augmentin  Past History:  Past Medical History: Current Problems:  BIPOLAR DISORDER UNSPECIFIED (ICD-296.80) OBESITY (ICD-278.00) HYPERTENSION (ICD-401.9) DIABETES MELLITUS, TYPE I (ICD-250.01) GERD  Family History: Mom fibromyalgie HTN, Dm Dad HTN,DM siblings 0 Second cousin, colon cancer, over age 77 No FH of liver dz, IBD, chronic GI illnesses.  Social History: employed, Quarry manager for [employer] Married 1 child, 3 yo Never Smoked Alcohol use-no Drug use-no  Review of Systems General:  Complains of weight loss; denies fever, chills, sweats, anorexia, fatigue, and weakness. Eyes:  Denies vision loss. ENT:  Complains of difficulty swallowing; denies nasal congestion, sore throat, and hoarseness. CV:  Denies chest pains, angina, palpitations, dyspnea on exertion, and peripheral edema. Resp:  Denies dyspnea at rest, dyspnea with exercise, and cough. GI:  See HPI. GU:  Denies urinary burning, blood in urine, and amenorrhea; no menstrual bleeding since on the pill. MS:  Denies joint pain / LOM. Derm:  Denies rash and itching. Neuro:  Denies weakness, paralysis, frequent headaches, memory loss, and confusion. Psych:  Denies depression, anxiety, and hallucinations. Endo:  Complains of unusual weight change; denies cold intolerance and heat intolerance. Heme:  Denies bruising and bleeding. Allergy:  Denies hives and rash.  Vital Signs:  Patient profile:   35 year old female Menstrual status:  regular Height:      65.5 inches Weight:      219 pounds BMI:     36.02 Temp:     98.3 degrees F oral Pulse rate:   76 / minute BP sitting:   118 / 80  (left arm) Cuff size:   large  Vitals Entered By: Hendricks Limes LPN  (June 03, 2009 8:52 AM)  Physical Exam  General:  Well developed, well nourished, no acute distress. Head:  Normocephalic and atraumatic. Eyes:  Conjunctivae pink, no scleral icterus.  Mouth:  Oropharyngeal mucosa moist, pink.  No lesions, erythema or exudate.    Neck:  Supple; no masses or thyromegaly. Lungs:  Clear throughout to auscultation. Heart:  Regular rate and rhythm; no murmurs, rubs,  or bruits. Abdomen:  Obese.  Soft.  ND, No HSM or masses.  No abd bruit or hernia.  Mild tenderness below the epigastrium to deep palpation.  No rebound or guarding. Extremities:  No clubbing, cyanosis, edema or deformities noted. Neurologic:  Alert and  oriented x4;  grossly normal neurologically. Skin:  Intact without significant lesions or rashes. Cervical Nodes:  No significant cervical adenopathy. Psych:  Alert and cooperative. Normal mood and affect.  Impression & Recommendations:  Problem # 1:  VOMITING (ICD-787.03)  chronic daily nausea and vomiting without significant abdominal pain. She was diagnosed with diabetes about 5 years ago. Abdominal ultrasound and HIDA scan were unremarkable. She may have diabetic gastroparesis but other etiologies need to be excluded. Recommend upper endoscopy for further evaluation of symptoms. Notably her typical reflux symptoms are now controlled on omeprazole. EGD to be performed in near future.  Risks, alternatives, benefits including but not limited to risk of reaction to medications, bleeding, infection, and perforation addressed.  Patient voiced understanding and verbal consent obtained.   Problem # 2:  CHANGE IN BOWELS (WJX-914.78)  She has had a change in bowel movements during this period of time as well. She went from going several days without a bowel movement to having 2-3 loose stools daily. We'll check  iFOBT. Upper endoscopy as planned. She may need to have a colonoscopy for further evaluation and change in stools, but we need to get her  vomiting under control first.  Patient in agreement of plan.  Problem # 3:  OTHER DYSPHAGIA (ICD-787.29)  Vague dysphagia primarily to certain pills but occasionally with food. Will evaluate at time of upper endoscopy.  Problem # 4:  AMENORRHEA (ICD-626.0) Likely secondary to birth control pill.  She denies menstrual bleeding in three months.  Advised to take pregnancy test prior to EGD.  Patient agreed to take home pregnancy test as opposed to serum HCG.  She will let us know results.  Other Orders: Consultation Level III [phone])

## 2010-09-04 NOTE — Assessment & Plan Note (Signed)
Summary: office viist  Nurse Visit   Allergies: 1)  ! Augmentin  Immunizations Administered:  PPD Skin Test:    Vaccine Type: PPD    Site: left forearm    Mfr: Sanofi Pasteur    Dose: 0.1 ml    Route: ID    Given by: Adella Hare LPN    Exp. Date: 05/16/2011    Lot #: [id number]  Orders Added: 1)  TB Skin Test [86580] 2)  Admin 1st Vaccine [90471]  Appended Document: office viist skin test is positive, pt needs a cXR and to be referred to the health dept, pls [place], and also we will contact her in the morning with info as to where and when to meet the publichealth nurse billie at the hd, contact info is [phone]  Appended Document: office viist   Appended Document: office viist   PPD Results    Date of reading: 01/30/2010    Results: > 15mm    Interpretation: positive

## 2010-09-04 NOTE — Progress Notes (Signed)
Summary: PAPER  Phone Note Call from Patient   Summary of Call: LOST HER NOTE FOR BEING OUT OF WORK FOR THE 14 & 15  WANTS TO KNOW CAN YOU PRINT IT OFF WILL COME GET IT TOMORROW Initial call taken by: Lind Guest,  September 24, 2009 3:24 PM  Follow-up for Phone Call        ok to do Follow-up by: Esperanza Sheets PA,  September 24, 2009 4:54 PM

## 2010-09-04 NOTE — Progress Notes (Signed)
Summary: referral question  Phone Note Call from Patient Call back at Work Phone [phone]   Summary of Call: pt called- would like a referral to the [facility] nutritionist for her gastroporesis. please advise.  Initial call taken by: Hendricks Limes LPN,  August 09, 2009 10:53 AM     Appended Document: referral question Not sure if aph nutritionist teaches gastroparesis diet.  Check with them first.  Appended Document: referral question LM for Burnard Hawthorne, nutritional care for [facility].Marland KitchenMarland KitchenMarland KitchenFaxed referral.

## 2010-09-04 NOTE — Progress Notes (Signed)
Summary: [facility]  [facility]   Imported By: Lind Guest 08/28/2008 08:33:19  _____________________________________________________________________  External Attachment:    Type:   Image     Comment:   External Document

## 2010-09-04 NOTE — Letter (Signed)
Summary: MEDICAL RELEASE  MEDICAL RELEASE   Imported By: Lind Guest 03/11/2010 14:14:57  _____________________________________________________________________  External Attachment:    Type:   Image     Comment:   External Document

## 2010-09-04 NOTE — Letter (Signed)
Summary: [facility]ES ORDER  GES ORDER   Imported By: Ave Filter 07/10/2009 12:48:10  _____________________________________________________________________  External Attachment:    Type:   Image     Comment:   External Document
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
## 2010-09-04 NOTE — Progress Notes (Signed)
  Phone Note From Pharmacy   Caller: [facility]ist Summary of Call: [facility] pharmacist called and states patient last filled glipizide 2.5mg  once daily from [place] in sept. but been filling glyburide 5mg  two once daily from another [facility] and filling at [facility]. what should patient be taking? [phone] ZDG#[id number] Initial call taken by: Worthy Keeler LPN,  August 30, 2008 10:54 AM  Follow-up for Phone Call        i spoke with the pt and this is clarified Follow-up by: Syliva Overman MD,  August 30, 2008 12:17 PM

## 2010-09-04 NOTE — Assessment & Plan Note (Signed)
Summary: office visit   Vital Signs:  Patient Profile:   35 Years Old Female Height:     64.5 inches Weight:      212 pounds BMI:     35.96 Pulse rate:   84 / minute Pulse rhythm:   regular Resp:     16 per minute BP sitting:   120 / 84  (left arm)  Pt. in pain?   no  Vitals Entered By: Worthy Keeler LPN (April 12, 2008 9:23 AM)                  Chief Complaint:  follow up chronic problems.  History of Present Illness: Pt.  has no concerns. the nephrologist told the pt. she did not need to be seen, because of her proteinuria and she is reassured by this.I explained the concern was the marked rise in proteinuria which was likely secondary to her being severely pre-eclamptic however the hope is that with good control in dm and HTN the no. will improve. She has not been exercising regularly but plans to change this.    Current Allergies: ! AUGMENTIN     Review of Systems  ENT      Denies earache, hoarseness, nasal congestion, sinus pressure, and sore throat.  CV      Denies chest pain or discomfort, palpitations, shortness of breath with exertion, and swelling of feet.  Resp      Denies cough, shortness of breath, sputum productive, and wheezing.  GI      Denies bloody stools, constipation, diarrhea, nausea, and vomiting.  GU      Denies dysuria and urinary frequency.  MS      Denies joint pain, joint swelling, muscle weakness, and stiffness.  Endo      Denies excessive thirst and polyuria.  Allergy      Denies itching eyes, seasonal allergies, and sneezing.   Physical Exam  General:     overweight-appearing.   Head:     Normocephalic and atraumatic without obvious abnormalities. No apparent alopecia or balding. Eyes:     vision grossly intact.   Ears:     External ear exam shows no significant lesions or deformities.  Otoscopic examination reveals clear canals, tympanic membranes are intact bilaterally without bulging, retraction, inflammation  or discharge. Hearing is grossly normal bilaterally. Nose:     no external erythema and no nasal discharge.   Mouth:     Oral mucosa and oropharynx without lesions or exudates.  Teeth in good repair. Neck:     No deformities, masses, or tenderness noted. Lungs:     Normal respiratory effort, chest expands symmetrically. Lungs are clear to auscultation, no crackles or wheezes. Heart:     Normal rate and regular rhythm. S1 and S2 normal without gallop, murmur, click, rub or other extra sounds. Abdomen:     soft and non-tender.   Extremities:     No clubbing, cyanosis, edema, or deformity noted with normal full range of motion of all joints.   Skin:     Intact without suspicious lesions or rashes Cervical Nodes:     No lymphadenopathy noted Psych:     Cognition and judgment appear intact. Alert and cooperative with normal attention span and concentration. No apparent delusions, illusions, hallucinations  Diabetes Management Exam:    Foot Exam (with socks and/or shoes not present):       Sensory-Monofilament:          Left foot: normal  Right foot: normal       Inspection:          Left foot: normal          Right foot: normal       Nails:          Left foot: normal          Right foot: normal    Impression & Recommendations:  Problem # 1:  BIPOLAR DISORDER UNSPECIFIED (ICD-296.80) Assessment: Unchanged  Problem # 2:  OBESITY (ICD-278.00) Assessment: Unchanged lifestyle change encouraged and discussed  Problem # 3:  HYPERTENSION (ICD-401.9) Assessment: Unchanged  Her updated medication list for this problem includes:    Methyldopa 500 Mg Tabs (Methyldopa) ..... One tab by mouth two times a day    Hydrochlorothiazide 25 Mg Tabs (Hydrochlorothiazide) ..... One tab by mouth once daily   Problem # 4:  DIABETES MELLITUS, TYPE I (ICD-250.01) Assessment: Unchanged  Her updated medication list for this problem includes:    Metformin Hcl 1000 Mg Tabs (Metformin  hcl) ..... One tab by mouth two times a day    Glipizide 2.5 Mg Xr24h-tab (Glipizide) ..... One tab by mouth once daily HBA1C at the visit is within norm and her blood glucose is 210, imediately post prandial  Her updated medication list for this problem includes:    Metformin Hcl 1000 Mg Tabs (Metformin hcl) ..... One tab by mouth two times a day    Glipizide 2.5 Mg Xr24h-tab (Glipizide) ..... One tab by mouth qd   Complete Medication List: 1)  Metformin Hcl 1000 Mg Tabs (Metformin hcl) .... One tab by mouth two times a day 2)  Methyldopa 500 Mg Tabs (Methyldopa) .... One tab by mouth two times a day 3)  Hydrochlorothiazide 25 Mg Tabs (Hydrochlorothiazide) .... One tab by mouth once daily 4)  Klor-con M20 20 Meq Tbcr (Potassium chloride crys cr) .... One tab by mouth once daily 5)  Glipizide 2.5 Mg Xr24h-tab (Glipizide) .... One tab by mouth qd 6)  Abilify 10 Mg Tabs (Aripiprazole) .... One tab by mouth qhs 7)  Wellbutrin Xl 300 Mg Xr24h-tab (Bupropion hcl) .... One tab by mouth every morning   Patient Instructions: 1)  No med changes at this time. 2)  Check your blood sugars regularly. If your readings are usually above250: or below 70 you should contact our office. 3)  Check your Blood Pressure regularly. If it is above150/95  you should make an appointment. 4)  It is important that you exercise regularly at least 20 minutes 5 times a week. If you develop chest pain, have severe difficulty breathing, or feel very tired , stop exercising immediately and seek medical attention. 5)  You need to lose weight. Consider a lower calorie diet and regular exercise.    Prescriptions: GLIPIZIDE 2.5 MG XR24H-TAB (GLIPIZIDE) one tab by mouth qd  #90 x 3   Entered by:   Worthy Keeler LPN   Authorized by:   Syliva Overman MD   Signed by:   Worthy Keeler LPN on 04/54/0981   Method used:   Handwritten   RxID:   [id number]  ] Laboratory Results   Blood Tests   Date/Time Received:  04/12/08 9:24am Date/Time Reported: 04/12/08 9:24am       Appended Document: office visit    Clinical Lists Changes  Orders: Added new Service order of Glucose, (CBG) (57846) - Signed Added new Service order of Hemoglobin A1C (96295) - Signed

## 2010-09-04 NOTE — Miscellaneous (Signed)
Summary: Immunization Entry   Immunization History:  Influenza Immunization History:    Influenza:  historical (04/01/2010)

## 2010-09-04 NOTE — Assessment & Plan Note (Signed)
Summary: office visit   Vital Signs:  Patient profile:   35 year old female Menstrual status:  regular Height:      65.5 inches Weight:      224 pounds BMI:     36.84 O2 Sat:      99 % on Room air Pulse rate:   92 / minute Pulse rhythm:   regular Resp:     16 per minute BP sitting:   110 / 70  (left arm)  Vitals Entered By: Adella Hare LPN (February 18, 2010 3:43 PM)  Nutrition Counseling: Patient's BMI is greater than 25 and therefore counseled on weight management options.  O2 Flow:  Room air CC: follow-up visit Is Patient Diabetic? Yes Did you bring your meter with you today? No Pain Assessment Patient in pain? no      Comments did not bring meds to ov   Primary Care Provider:  Syliva Overman  CC:  follow-up visit.  History of Present Illness: Reports  that she has not been doing very well. She was recently told that she will be laid off, and ius obviously upset about this. 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 She is to satrt a 6 month course of treatment through the health dept tomorrow. Denies recent fever or chills. Denies sinus pressure, nasal congestion , ear pain or sore throat. Denies chest congestion, or cough productive of sputum. Denies chest pain, palpitations, PND, orthopnea or leg swelling. Denies abdominal pain, nausea, vomitting, diarrhea or constipation. Denies change in bowel movements or bloody stool. Denies dysuria , frequency, incontinence or hesitancy. Denies  joint pain, swelling, or reduced mobility. Denies headaches, vertigo, seizures. She does have increase deprsion and anxiety, now she will soon be jobless, she has anger about the situation, currently off of wellbutrin , I advuised that she resume. She denies suivcidal , or homicidal ieation, and has no hallucinations. She was evaluated recently by her psyh, bu at that time did not have the stress of  facing joblessness Denies  rash, lesions, or itch.     Allergies (verified): 1)  ! Augmentin  Past History:  Past medical, surgical, family and social histories (including risk factors) reviewed, and no changes noted (except as noted below).  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 Removal of dermoid tumor (2000) Breast reduction (1994)  Family History: Reviewed history from 06/03/2009 and no changes required. Mom fibromyalgie HTN, Dm Dad HTN,DM siblings 0 Second cousin, colon cancer, over age 5 No FH of liver dz, IBD, chronic GI illnesses.  Social History: Reviewed history from 06/03/2009 and no changes required. employed, Quarry manager for [employer] Married 1 child, 3 yo Never Smoked Alcohol use-no Drug use-no  Review of Systems      See HPI General:  Complains of fatigue; denies chills, fever, sweats, and weakness. Eyes:  Denies blurring and discharge. Resp:  Denies cough, coughing up blood, shortness of breath, and sputum productive. Psych:  Complains of anxiety, depression, and mental problems; denies suicidal thoughts/plans, thoughts of violence, and unusual visions or sounds. Endo:  fasting blood sugars 100 to 120, but out of range for 1 month when she was in a shelter. Heme:  Denies abnormal bruising and bleeding. Allergy:  Denies hives or rash and itching eyes.  Physical Exam  General:  Well-developed,obese,in no acute distress; alert,appropriate and cooperative throughout examination HEENT: No facial asymmetry,  EOMI, No sinus tenderness, TM's Clear, oropharynx  pink and moist.   Chest: Clear to auscultation bilaterally.  CVS: S1, S2, No murmurs, No S3.   Abd: Soft, Nontender.    MS: Adequate ROM spine, hips, shoulders and knees.  Ext: No edema.   CNS: CN 2-12 intact, power tone and sensation normal throughout.   Skin: Intact, no visible lesions or rashes.  Psych: Good eye contact, normal affect.  Memory intact, slightly anxious and depressed appearing.   Diabetes Management Exam:    Foot Exam (with socks and/or shoes not present):       Sensory-Monofilament:          Left foot: normal          Right foot: normal       Inspection:          Left foot: normal          Right foot: normal       Nails:          Left foot: normal          Right foot: normal   Impression & Recommendations:  Problem # 1:  FATIGUE (ICD-780.79) Assessment Deteriorated  Orders: T-CBC w/Diff ([id number])  Problem # 2:  HYPERTENSION (ICD-401.9) Assessment: Unchanged  Her updated medication list for this problem includes:    Labetalol Hcl 300 Mg Tabs (Labetalol hcl) .Marland Kitchen... Take 2 tabs by mouth two times a day  Orders: T-Basic Metabolic Panel [id number])  BP today: 110/70 Prior BP: 100/80 (12/23/2009)  Labs Reviewed: K+: 4.1 (12/03/2009) Creat: : 1.01 (12/03/2009)   Chol: 152 (12/03/2009)   HDL: 66 (12/03/2009)   LDL: 74 (12/03/2009)   TG: 60 (12/03/2009)  Problem # 3:  BIPOLAR DISORDER UNSPECIFIED (ICD-296.80) Assessment: Comment Only increased risk of depression with change in employment status upcoming as well as positive PPd  Problem # 4:  OBESITY (ICD-278.00) Assessment: Deteriorated  Ht: 65.5 (02/18/2010)   Wt: 224 (02/18/2010)   BMI: 36.84 (02/18/2010)  Problem # 5:  DIABETES MELLITUS, TYPE II (ICD-250.00) Assessment: Comment Only  Her updated medication list for this problem includes:    Metformin Hcl 1000 Mg Tabs (Metformin hcl) ..... One tab by mouth two times a day    Glipizide 5 Mg Xr24h-tab (Glipizide) ..... One tab by mouth qd  Orders: T- Hemoglobin A1C (78469-62952) T- Hemoglobin A1C (84132-44010)  Labs Reviewed: Creat: 1.01 (12/03/2009)     Reviewed HgBA1c results: 6.4 (09/02/2009)  6.6 (04/30/2009)  Complete Medication List: 1)  Metformin Hcl 1000 Mg Tabs (Metformin hcl) .... One tab by mouth two times a day 2)  Abilify 10 Mg Tabs (Aripiprazole) .... One tab by mouth qhs 3)  Wellbutrin Xl 300 Mg Xr24h-tab (Bupropion hcl) .... One tab by mouth every morning 4)  Labetalol Hcl 300 Mg Tabs (Labetalol hcl) .... Take 2 tabs by mouth two times a day 5)  Glipizide 5 Mg Xr24h-tab (Glipizide) .... One tab by mouth qd 6)  Omeprazole 40 Mg Cpdr (Omeprazole) .... Take 1 capsule by mouth once a day 7)  Reglan 5 Mg Tabs (Metoclopramide hcl) .Marland Kitchen.. 1 by mouth 30 minute prior to breakfast and lunch 8)  Ibuprofen 800 Mg Tabs (Ibuprofen) .Marland Kitchen.. 1 three times a day as needed  Patient Instructions: 1)  Please schedule a  follow-up appointment in 3 months. 2)  It is important that you exercise regularly at least 20 minutes 5 times a week. If you develop chest pain, have severe difficulty breathing, or feel very tired , stop exercising immediately and seek medical attention. 3)  You need to lose weight. Consider a lower calorie diet and regular exercise.  4)  HbgA1C prior to visit, ICD-9:  today 5)  fasting chem 7,HBA1C and CBC in 4 months 6)  Pls refill the wellbutrin, and all the best in a job search

## 2010-09-04 NOTE — Assessment & Plan Note (Signed)
Summary: office visit   Vital Signs:  Patient Profile:   35 Years Old Female Height:     64.5 inches Weight:      228 pounds BMI:     38.67 O2 Sat:      94 % Pulse rate:   80 / minute Resp:     14 per minute BP sitting:   140 / 100  Vitals Entered By: Everitt Amber (July 04, 2008 1:33 PM)                 Chief Complaint:  Follow up and has sinus infection that started yesterday morning.  History of Present Illness: 2 day h/o sinus pressure , green drainage, head pressur, sore throat. Cough today, non productive.Pt is experiencing chills, body aches and has had no documented fever. she denies symptoms of uncontrolled blood sugar.  She is currently seeing high risk OB and has ahd a change in her BP meds recently.    Current Allergies: ! AUGMENTIN     Review of Systems  General      Complains of chills, fatigue, and malaise.  ENT      Complains of hoarseness, postnasal drainage, sinus pressure, and sore throat.  CV      Denies chest pain or discomfort, shortness of breath with exertion, and swelling of feet.  Resp      Complains of cough.      Denies shortness of breath, sputum productive, and wheezing.  GI      Denies abdominal pain, constipation, diarrhea, nausea, and vomiting.  GU      Denies dysuria and urinary frequency.  MS      Denies joint pain and stiffness.  Psych      Complains of anxiety.      Denies suicidal thoughts/plans, thoughts of violence, unusual visions or sounds, and thoughts /plans of harming others.  Endo      Denies cold intolerance, excessive hunger, excessive thirst, excessive urination, heat intolerance, polyuria, and weight change.   Physical Exam  General:     overweight-appearing.   Head:     positive frontal tenderness Eyes:     vision grossly intact.   Ears:     External ear exam shows no significant lesions or deformities.  Otoscopic examination reveals clear canals, tympanic membranes are intact  bilaterally without bulging, retraction, inflammation or discharge. Hearing is grossly normal bilaterally. Nose:     no external deformity and no nasal discharge.   Mouth:     Oral mucosa and oropharynx without lesions or exudates.  Teeth in good repair. Neck:     No deformities, masses, or tenderness noted. Lungs:     adequate air entry few scattered cracklesd Heart:     Normal rate and regular rhythm. S1 and S2 normal without gallop, murmur, click, rub or other extra sounds. Abdomen:     soft and non-tender.   Extremities:     No clubbing, cyanosis, edema, or deformity noted with normal full range of motion of all joints.   Skin:     Intact without suspicious lesions or rashes Cervical Nodes:     No lymphadenopathy noted Psych:     Cognition and judgment appear intact. Alert and cooperative with normal attention span and concentration. No apparent delusions, illusions, hallucinations    Impression & Recommendations:  Problem # 1:  OTHER ACUTE SINUSITIS (ICD-461.8) Assessment: Comment Only  Her updated medication list for this problem includes:    Lawyer  100 Mg Caps (Benzonatate) .Marland Kitchen... Take 1 capsule by mouth three times a day    Ery-tab 500 Mg Tbec (Erythromycin base) .Marland Kitchen... Take 1 tablet by mouth three times a day   Problem # 2:  BIPOLAR DISORDER UNSPECIFIED (ICD-296.80) Assessment: Improved pt to continue to follow with psych  Problem # 3:  HYPERTENSION (ICD-401.9) Assessment: Deteriorated  Her updated medication list for this problem includes:    Methyldopa 500 Mg Tabs (Methyldopa) ..... One tab by mouth two times a day    Hydrochlorothiazide 25 Mg Tabs (Hydrochlorothiazide) ..... One tab by mouth once daily  BP today: 140/100 Prior BP: 120/84 (04/12/2008)  se previous comment  Labs Reviewed: Creat: 1.14 (10/08/2007) Chol: 110 (10/08/2007)   HDL: 51 (10/08/2007)   LDL: 40 (10/08/2007)   TG: 95 (10/08/2007)   Problem # 4:  DIABETES MELLITUS, TYPE I  (ICD-250.01) Assessment: Improved  Her updated medication list for this problem includes:    Metformin Hcl 1000 Mg Tabs (Metformin hcl) ..... One tab by mouth two times a day    Glipizide 2.5 Mg Xr24h-tab (Glipizide) ..... One tab by mouth qd  Orders: Glucose, (CBG) (82962) Hemoglobin A1C (83036)  Labs Reviewed: HgBA1c: 6.2 (07/04/2008)   Creat: 1.14 (10/08/2007)   Microalbumin: 17.70 (01/10/2008)   Complete Medication List: 1)  Metformin Hcl 1000 Mg Tabs (Metformin hcl) .... One tab by mouth two times a day 2)  Methyldopa 500 Mg Tabs (Methyldopa) .... One tab by mouth two times a day 3)  Hydrochlorothiazide 25 Mg Tabs (Hydrochlorothiazide) .... One tab by mouth once daily 4)  Klor-con M20 20 Meq Tbcr (Potassium chloride crys cr) .... One tab by mouth once daily 5)  Glipizide 2.5 Mg Xr24h-tab (Glipizide) .... One tab by mouth qd 6)  Abilify 10 Mg Tabs (Aripiprazole) .... One tab by mouth qhs 7)  Wellbutrin Xl 300 Mg Xr24h-tab (Bupropion hcl) .... One tab by mouth every morning 8)  Tessalon Perles 100 Mg Caps (Benzonatate) .... Take 1 capsule by mouth three times a day 9)  Ery-tab 500 Mg Tbec (Erythromycin base) .... Take 1 tablet by mouth three times a day 10)  Fluconazole 150 Mg Tabs (Fluconazole) .... Take 1 tablet by mouth once a day   Patient Instructions: 1)  You are being treated for sinus infection and bronchitis, pls ensur you take all the meds prescribed. You will also be given a work excuse. 2)  Your blood sugar is doing well, no med change  3)  Your bP is elevated, continue to work with your obstetrician.   Prescriptions: FLUCONAZOLE 150 MG TABS (FLUCONAZOLE) Take 1 tablet by mouth once a day  #1 x 0   Entered and Authorized by:   Syliva Overman MD   Signed by:   Syliva Overman MD on 07/04/2008   Method used:   Electronically to        [facility]* (retail)       [address]       [place], [place]  [address]       Ph:  [phone]       Fax: [phone]   RxID:   [id number] ERY-TAB 500 MG TBEC (ERYTHROMYCIN BASE) Take 1 tablet by mouth three times a day  #30 x 0   Entered and Authorized by:   Syliva Overman MD   Signed by:   Syliva Overman MD on 07/04/2008   Method used:   Electronically to  [facility]* (retail)       [address]       [place], [place]  [address]       Ph: [phone]       Fax: [phone]   RxID:   [id number] TESSALON PERLES 100 MG CAPS (BENZONATATE) Take 1 capsule by mouth three times a day  #30 x 0   Entered and Authorized by:   Syliva Overman MD   Signed by:   Syliva Overman MD on 07/04/2008   Method used:   Electronically to        [facility]* (retail)       [address] [place], [place]  [address]       Ph: [phone]       Fax: [phone]   RxID:   [id number]  ]  Orders Added: 1)  Glucose, (CBG) [82962] 2)  Hemoglobin A1C [83036] 3)  Est. Patient Level IV [01093]   Laboratory Results   Blood Tests   Date/Time Received: July 04, 2008 2:00 Date/Time Reported: July 04, 2008 2:00  Glucose (random): 102 mg/dL   (Normal Range: 23-557) HGBA1C: 6.2%   (Normal Range: Non-Diabetic - 3-6%   Control Diabetic - 6-8%)

## 2010-09-04 NOTE — Progress Notes (Signed)
  Phone Note Call from Patient   Summary of Call: hoarse, coughing up some dark yellow and red phlegm, and she has muscle aches, chills, but no fever. Started Tuesday [facility] Initial call taken by: Everitt Amber,  September 13, 2009 10:34 AM  Follow-up for Phone Call        advise and erx z pack x 1 and tessalon perles 100mg  one three tiomes daily #30 only, let her know I am sending a note to dr. Ivar Bury re the bP med and should get a response next week.  Let her know I plan to add an ACE unless she plans to conceive inthe next 12to 18 months, if she does i need to know Follow-up by: Syliva Overman MD,  September 13, 2009 1:37 PM  Additional Follow-up for Phone Call Additional follow up Details #1::        Meds sent, Patient aware SHE DOES PLAN ON CONCIEVING IN THE NEXT 1-2 YEARS Additional Follow-up by: Everitt Amber,  September 13, 2009 2:28 PM    New/Updated Medications: ZITHROMAX 250 MG TABS (AZITHROMYCIN) UAD TESSALON PERLES 100 MG CAPS (BENZONATATE) Take 1 tablet by mouth three times a day Prescriptions: TESSALON PERLES 100 MG CAPS (BENZONATATE) Take 1 tablet by mouth three times a day  #30 x 0   Entered by:   Everitt Amber   Authorized by:   Syliva Overman MD   Signed by:   Everitt Amber on 09/13/2009   Method used:   Electronically to        [facility] [address] [phone]* (retail)       [address] [place], [place]  [address]       Ph: [phone]       Fax: [phone]   RxID:   [id number] ZITHROMAX 250 MG TABS (AZITHROMYCIN) UAD  #1 x 0   Entered by:   Everitt Amber   Authorized by:   Syliva Overman MD   Signed by:   Everitt Amber on 09/13/2009   Method used:   Electronically to        [facility] [address] [phone]* (retail)       [address] [place]  [address]       Ph: [phone]       Fax: [phone]   RxID:   [id number]

## 2010-09-04 NOTE — Letter (Signed)
Summary: medical release  medical release   Imported By: Lind Guest 06/20/2010 14:39:38  _____________________________________________________________________  External Attachment:    Type:   Image     Comment:   External Document

## 2010-09-04 NOTE — Progress Notes (Signed)
Summary: call  Phone Note Call from Patient   Summary of Call: does she need to keep taking the tamiflu call back at [phone] Initial call taken by: Lind Guest,  February 28, 2009 3:29 PM  Follow-up for Phone Call        patient advised to keep taking the tamiflu per dr. Lodema Hong and aware of her appointment tomorrow for CT at 9:30am Follow-up by: Everitt Amber,  February 28, 2009 4:17 PM

## 2010-09-04 NOTE — Progress Notes (Signed)
Summary: INJECTION  Phone Note Call from Patient   Summary of Call: WAS IN LAST WEEK GOT A SHOT FOR NAUS. AND WANTS ANOTHER ONE IF U WANT HER TO BRING HOME THAT TEST (STOOL) SHE WILL DO IT CALL BACK [phone] Initial call taken by: Lind Guest,  September 18, 2008 11:18 AM  Follow-up for Phone Call        ptstill has nausea, no diarreah, reports bloating and belching, needs gall bladder uc.   pLS sched Gall bladder US eval abd pain and bloating, also if she calls tomorrow for an injection if we have 2 nurses, pls work her in Follow-up by: Syliva Overman MD,  September 18, 2008 12:36 PM  Additional Follow-up for Phone Call Additional follow up Details #1::        pt has appt at [facility] for 09/21/2008 8:00. pt aware Additional Follow-up by: Rudene Anda,  September 19, 2008 9:39 AM

## 2010-09-04 NOTE — Letter (Signed)
Summary: [facility]   Imported By: Lind Guest 06/10/2010 15:10:39  _____________________________________________________________________  External Attachment:    Type:   Image     Comment:   External Document  Appended Document: GASTROPARESIS, GERD 6 MONTH REMINDER IS IN THE COMPUTER  Appended Document: Orders Update    Clinical Lists Changes  Orders: Added new Service order of Est. Patient Level III ([address]109) - Signed

## 2010-09-04 NOTE — Progress Notes (Signed)
Summary: [facility]  [facility]   Imported By: Lind Guest 09/06/2009 08:20:43  _____________________________________________________________________  External Attachment:    Type:   Image     Comment:   External Document

## 2010-09-04 NOTE — Medication Information (Signed)
Summary: [employer]   Imported By: Lind Guest 08/31/2008 07:59:40  _____________________________________________________________________  External Attachment:    Type:   Image     Comment:   External Document

## 2010-09-04 NOTE — Progress Notes (Signed)
Summary: fever  Phone Note Call from Patient   Summary of Call: had a fever since monday and calls wanting to know can she be worked in today or tomorrow we have no where to put her and asked jaime she said urgent care or ed then asked Yamilka how high was her fever and said 99.5 and 99.7 Initial call taken by: Lind Guest,  July 10, 2010 1:08 PM  Follow-up for Phone Call        patient calls for appt, luann states no available openings or work in slots, patient was advised urgent care Follow-up by: Adella Hare LPN,  July 10, 2010 1:16 PM

## 2010-09-04 NOTE — Assessment & Plan Note (Signed)
Summary: stool card- cdg  pt returned ifobt and it was negative  Allergies: 1)  ! Augmentin  Other Orders: Immuno-chemical Fecal Occult (98119)  Appended Document: stool card- cdg egd as planned  Appended Document: stool card- cdg tried to call pt-LMOM  Appended Document: stool card- cdg tried to call pt-NA  Appended Document: stool card- cdg [facility] for pt to call. Letter mailed to call. ( referral letter inadvertently printed also)  Appended Document: stool card- cdg Pt informed!

## 2010-09-04 NOTE — Assessment & Plan Note (Signed)
Summary: OFFICE VISIT   Vital Signs:  Patient profile:   35 year old female Menstrual status:  regular Height:      65.5 inches Weight:      217 pounds BMI:     35.69 O2 Sat:      100 % Pulse rate:   74 / minute Pulse rhythm:   regular Resp:     16 per minute BP sitting:   132 / 90 Cuff size:   large  Vitals Entered By: Everitt Amber (September 02, 2009 4:35 PM)  Nutrition Counseling: Patient's BMI is greater than 25 and therefore counseled on weight management options.  CC: Follow up chronic problems Is Patient Diabetic? Yes   Primary Care Provider:  Lodema Hong  CC:  Follow up chronic problems.  History of Present Illness: pt has concerns about bP , sghe has  been on cardura as well as labetalol and HCTZ since october.nIn thepast 2 weeks she became lightheadeed,  systolics  wereas low asin the 90's, she called nephrologist was s told to stop the cardura, and in the past she stopped hctz`on her own, now only on labetalol and has been getting nl no's 114/87. Reports  that she has been optherwise been doing well. She has not been exercising as intended however, and her weight is unchanged, she intends to revers this. Denies recent fever or chills. Denies sinus pressure, nasal congestion , ear pain or sore throat. Denies chest congestion, or cough productive of sputum. Denies chest pain, palpitations, PND, orthopnea or leg swelling. Denies abdominal pain, nausea, vomitting, diarrhea or constipation. Denies change in bowel movements or bloody stool. Denies dysuria , frequency, incontinence or hesitancy. Denies  joint pain, swelling, or reduced mobility. Denies headaches, vertigo, seizures. Denies depression, anxiety or insomnia. Denies  rash, lesions, or itch.     Allergies: 1)  ! Augmentin  Review of Systems      See HPI Eyes:  Denies blurring and discharge. Endo:  Denies cold intolerance, excessive hunger, excessive thirst, excessive urination, heat intolerance, polyuria,  and weight change; tests regulrly, and fasting sugars are seldom over 110 . Heme:  Denies abnormal bruising and bleeding. Allergy:  Denies hives or rash.  Physical Exam  General:  Well-developedobese,in no acute distress; alert,appropriate and cooperative throughout examination HEENT: No facial asymmetry,  EOMI, No sinus tenderness, TM's Clear, oropharynx  pink and moist.   Chest: Clear to auscultation bilaterally.  CVS: S1, S2, No murmurs, No S3.   Abd: Soft, Nontender.  MS: Adequate ROM spine, hips, shoulders and knees.  Ext: No edema.   CNS: CN 2-12 intact, power tone and sensation normal throughout.   Skin: Intact, no visible lesions or rashes.  Psych: Good eye contact, normal affect.  Memory intact, not anxious or depressed appearing.   Diabetes Management Exam:    Foot Exam (with socks and/or shoes not present):       Sensory-Monofilament:          Left foot: normal          Right foot: normal       Inspection:          Left foot: normal          Right foot: normal       Nails:          Left foot: normal          Right foot: normal   Impression & Recommendations:  Problem # 1:  HYPERTENSION (ICD-401.9) Assessment Deteriorated  The following medications were removed from the medication list:    Methyldopa 250 Mg Tabs (Methyldopa) .Marland Kitchen... Take 1 tab by mouth at bedtime Her updated medication list for this problem includes:    Labetalol Hcl 300 Mg Tabs (Labetalol hcl) .Marland Kitchen... Take 2 tabs by mouth two times a day, will check on ace option with nephrology. I believe in the recent past , pt has expressed a desire to have another child which would be the reason to stay off ace, I will prescribe lotensin 10mg  if this is not so when i spek to the pt and if ok with card Orders: T-Basic Metabolic Panel [id number])  BP today: 132/90 Prior BP: 118/80 (06/03/2009)  Labs Reviewed: K+: 3.9 (10/13/2008) Creat: : 1.05 (10/13/2008)   Chol: 124 (10/13/2008)   HDL: 56 (10/13/2008)    LDL: 45 (10/13/2008)   TG: 116 (10/13/2008)  Problem # 2:  DIABETES MELLITUS, TYPE II (ICD-250.00) Assessment: Improved  Her updated medication list for this problem includes:    Metformin Hcl 1000 Mg Tabs (Metformin hcl) ..... One tab by mouth two times a day    Glipizide 5 Mg Xr24h-tab (Glipizide) ..... One tab by mouth qd  Orders: Glucose, (CBG) (82962) Hemoglobin A1C (83036)  Labs Reviewed: Creat: 1.05 (10/13/2008)    Reviewed HgBA1c results: 6.4 (09/02/2009)  6.6 (04/30/2009)  Problem # 3:  GERD (ICD-530.81) Assessment: Improved  Her updated medication list for this problem includes:    Omeprazole 40 Mg Cpdr (Omeprazole) .Marland Kitchen... Take 1 capsule by mouth once a day  Problem # 4:  GASTROPARESIS (ICD-536.3) Assessment: Improved pt on reglan and notes improvement  Problem # 5:  OBESITY (ICD-278.00) Assessment: Unchanged  Orders: T-Lipid Profile ([id number])  Ht: 65.5 (09/02/2009)   Wt: 217 (09/02/2009)   BMI: 35.69 (09/02/2009)  Problem # 6:  BIPOLAR DISORDER UNSPECIFIED (ICD-296.80) Assessment: Improved treated by psychiatry  Complete Medication List: 1)  Metformin Hcl 1000 Mg Tabs (Metformin hcl) .... One tab by mouth two times a day 2)  Abilify 10 Mg Tabs (Aripiprazole) .... One tab by mouth qhs 3)  Wellbutrin Xl 300 Mg Xr24h-tab (Bupropion hcl) .... One tab by mouth every morning 4)  Labetalol Hcl 300 Mg Tabs (Labetalol hcl) .... Take 2 tabs by mouth two times a day 5)  Glipizide 5 Mg Xr24h-tab (Glipizide) .... One tab by mouth qd 6)  Portia-28 0.15-0.03 Mg Tabs (Levonorgestrel-ethinyl estrad) .... Take 1 tablet by mouth once a day 7)  Omeprazole 40 Mg Cpdr (Omeprazole) .... Take 1 capsule by mouth once a day 8)  Reglan 5 Mg Tabs (Metoclopramide hcl) .Marland Kitchen.. 1 by mouth 30 minute prior to breakfast and lunch  Other Orders: T-TSH ([id number])  Patient Instructions: 1)  Please schedule a follow-up appointment in 3.41months. 2)  It is important that you exercise  regularly at least 20 minutes 5 times a week. If you develop chest pain, have severe difficulty breathing, or feel very tired , stop exercising immediately and seek medical attention. 3)  You need to lose weight. Consider a lower calorie diet and regular exercise.  4)  i will send a msg to your nephrologist and get back to you about the bP meds 5)  BMP prior to visit, ICD-9: 6)  Lipid Panel prior to visit, ICD-9:  fasting in 3 months 7)  TSH prior to visit, ICD-9: Prescriptions: METHYLDOPA 250 MG TABS (METHYLDOPA) Take 1 tab by mouth at bedtime  #30 x 3   Entered and Authorized by:   Claris Che  Lodema Hong MD   Signed by:   Syliva Overman MD on 09/13/2009   Method used:   Electronically to        [facility]* (retail)       [address]       [place], [place]  [address]       Ph: [phone]       Fax: [phone]   RxID:   [id number]   Laboratory Results   Blood Tests   Date/Time Received: September 02, 2009  Date/Time Reported: September 02, 2009   Glucose (random): 100 mg/dL   (Normal Range: 84-132) HGBA1C: 6.4%   (Normal Range: Non-Diabetic - 3-6%   Control Diabetic - 6-8%)

## 2010-09-04 NOTE — Letter (Signed)
Summary: MED LIST  MED LIST   Imported By: Lind Guest 06/02/2010 10:06:07  _____________________________________________________________________  External Attachment:    Type:   Image     Comment:   External Document

## 2010-09-04 NOTE — Letter (Signed)
Summary: OPTHALMOLOGIST  OPTHALMOLOGIST   Imported By: Lind Guest 07/04/2010 08:37:04  _____________________________________________________________________  External Attachment:    Type:   Image     Comment:   External Document

## 2010-09-04 NOTE — Letter (Signed)
Summary: Scheduled Appointment  [facility]  [address]   [place], [place] [address]   Phone: [phone]  Fax: [phone]    May 08, 2009   Dear: Christina [name]            DOB: 06/07/1976    I have been instructed to schedule you an appointment in our office.  Your appointment is as follows:   Date: __11/1/2010_________________   Time: __8:30_________________     Please be here 15 minutes early.   Provider: Tana Coast, PA_________________________    Please contact the office if you need to reschedule this appointment for a more convenient time.   Thank you,    Elinor Parkinson       [facility] Ph: [phone]   Fax: [phone]

## 2010-09-04 NOTE — Letter (Signed)
Summary: EGD ORDER  EGD ORDER   Imported By: Ave Filter 06/03/2009 15:04:35  _____________________________________________________________________  External Attachment:    Type:   Image     Comment:   External Document

## 2010-09-04 NOTE — Assessment & Plan Note (Signed)
Summary: F UP   Vital Signs:  Patient profile:   35 year old female Menstrual status:  regular Height:      65.5 inches Weight:      216.50 pounds O2 Sat:      98 % on Room air Pulse rate:   87 / minute Resp:     16 per minute BP sitting:   120 / 70  (left arm)  Vitals Entered By: Adella Hare LPN (June 20, 2010 8:40 AM)  O2 Flow:  Room air  Primary Care Provider:  Lodema Hong, M.D.   History of Present Illness: Reports  that she has been doing well , except that she is frustrated being unemployd, but is actively seeking. she has seen all specialists this year, eyes, kidnes, is in therapy, gynae and psych. Recently std a gastroparesis diet and is losing weight. Denies recent fever or chills. Denies sinus pressure, nasal congestion , ear pain or sore throat. Denies chest congestion, or cough productive of sputum. Denies chest pain, palpitations, PND, orthopnea or leg swelling. Denies abdominal pain, nausea, vomitting, diarrhea or constipation. Denies change in bowel movements or bloody stool. Denies dysuria , frequency, incontinence or hesitancy. Denies  joint pain, swelling, or reduced mobility. Denies headaches, vertigo, seizures. Denies depression, anxiety or insomnia. Denies  rash, lesions, or itch.     Current Medications (verified): 1)  Metformin Hcl 1000 Mg  Tabs (Metformin Hcl) .... One Tab By Mouth Two Times A Day 2)  Abilify 10 Mg Tabs (Aripiprazole) .... One Tab By Mouth Qhs 3)  Wellbutrin Xl 300 Mg Xr24h-Tab (Bupropion Hcl) .... One Tab By Mouth Every Morning 4)  Labetalol Hcl 300 Mg Tabs (Labetalol Hcl) .... Take 2 Tabs By Mouth Two Times A Day 5)  Glipizide 5 Mg Xr24h-Tab (Glipizide) .... One Tab By Mouth Qd 6)  Omeprazole 40 Mg Cpdr (Omeprazole) .... Take 1 Capsule By Mouth Once A Day 7)  Reglan 5 Mg Tabs (Metoclopramide Hcl) .Marland Kitchen.. 1 By Mouth 30 Minute Prior To Breakfast and Lunch 8)  Inh .... Once Daily 9)  Hydrochlorothiazide 25 Mg Tabs  (Hydrochlorothiazide) .... Once Daily 10)  Pyridoxine Hcl 25 Mg Tabs (Pyridoxine Hcl) .... One Tab By Mouth Once Daily 11)  Isoniazid 300 Mg Tabs (Isoniazid) .... One Tab By Mouth Once Daily 12)  Trazodone Hcl 50 Mg Tabs (Trazodone Hcl) .... One Tab By Mouth At Bedtime As Needed 13)  Loratadine 10 Mg Tabs (Loratadine) .... One Tab By Mouth Once Daily  Allergies (verified): 1)  ! Augmentin  Past History:  Past Medical History: Current Problems:  BIPOLAR DISORDER UNSPECIFIED (ICD-296.80) OBESITY (ICD-278.00) HYPERTENSION (ICD-401.9) DIABETES MELLITUS, TYPE I (ICD-250.01) GERD POSITIVE PPD , treatment std August 2011  Social History: got laid off  Sept 2011 from the [employer] cross Married 1 child, 4 yo  in 2011 Never Smoked Alcohol use-no Drug use-no  Review of Systems      See HPI General:  Complains of fatigue. Eyes:  Denies blurring, discharge, eye irritation, eye pain, and red eye. Psych:  Complains of mental problems; controlled on meds andwith therapy . Endo:  Denies cold intolerance, excessive hunger, excessive thirst, and excessive urination. Heme:  Denies abnormal bruising and bleeding. Allergy:  Denies hives or rash and itching eyes.  Physical Exam  General:  Well-developed,obese,in no acute distress; alert,appropriate and cooperative throughout examination HEENT: No facial asymmetry,  EOMI, No sinus tenderness, TM's Clear, oropharynx  pink and moist. Goiter  Chest: Clear to auscultation bilaterally.  CVS: S1, S2, No murmurs, No S3.   Abd: Soft, Nontender.  MS: Adequate ROM spine, hips, shoulders and knees.  Ext: No edema.   CNS: CN 2-12 intact, power tone and sensation normal throughout.   Skin: Intact, no visible lesions or rashes.  Psych: Good eye contact, normal affect.  Memory intact, not anxious or depressed appearing.   Diabetes Management Exam:    Foot Exam (with socks and/or shoes not present):       Sensory-Monofilament:          Left foot: normal           Right foot: normal       Inspection:          Left foot: normal          Right foot: normal       Nails:          Left foot: normal          Right foot: normal   Impression & Recommendations:  Problem # 1:  GOITER (ICD-240.9) Assessment Comment Only  Orders: Radiology Referral (Radiology)  Problem # 2:  GASTROPARESIS (ICD-536.3) Assessment: Comment Only being treated by gI on new diet which is working well  Problem # 3:  DIABETES MELLITUS, TYPE II (ICD-250.00) Assessment: Improved  Her updated medication list for this problem includes:    Metformin Hcl 1000 Mg Tabs (Metformin hcl) ..... One tab by mouth two times a day    Glipizide 5 Mg Xr24h-tab (Glipizide) ..... One tab by mouth qd  Orders: T- Hemoglobin A1C ([id number])  Labs Reviewed: Creat: 1.02 (06/17/2010)    Reviewed HgBA1c results: 6.4 (06/17/2010)  6.8 (02/18/2010)  Problem # 4:  HYPERTENSION (ICD-401.9) Assessment: Unchanged  Her updated medication list for this problem includes:    Labetalol Hcl 300 Mg Tabs (Labetalol hcl) .Marland Kitchen... Take 2 tabs by mouth two times a day    Hydrochlorothiazide 25 Mg Tabs (Hydrochlorothiazide) ..... Once daily  Orders: T-Basic Metabolic Panel ([id number])  BP today: 120/70 Prior BP: 118/84 (06/17/2010)  Labs Reviewed: K+: 4.2 (06/17/2010) Creat: : 1.02 (06/17/2010)   Chol: 152 (12/03/2009)   HDL: 66 (12/03/2009)   LDL: 74 (12/03/2009)   TG: 60 (12/03/2009)  Problem # 5:  BIPOLAR DISORDER UNSPECIFIED (ICD-296.80) Assessment: Improved  Problem # 6:  OBESITY (ICD-278.00) Assessment: Unchanged  Ht: 65.5 (06/20/2010)   Wt: 216.50 (06/20/2010)   BMI: 36.02 (06/17/2010) therapeutic lifestyle change discussed and encouraged  Complete Medication List: 1)  Metformin Hcl 1000 Mg Tabs (Metformin hcl) .... One tab by mouth two times a day 2)  Abilify 10 Mg Tabs (Aripiprazole) .... One tab by mouth qhs 3)  Wellbutrin Xl 300 Mg Xr24h-tab (Bupropion hcl) .... One tab by  mouth every morning 4)  Labetalol Hcl 300 Mg Tabs (Labetalol hcl) .... Take 2 tabs by mouth two times a day 5)  Glipizide 5 Mg Xr24h-tab (Glipizide) .... One tab by mouth qd 6)  Omeprazole 40 Mg Cpdr (Omeprazole) .... Take 1 capsule by mouth once a day 7)  Reglan 5 Mg Tabs (Metoclopramide hcl) .Marland Kitchen.. 1 by mouth 30 minute prior to breakfast and lunch 8)  Inh  .... Once daily 9)  Hydrochlorothiazide 25 Mg Tabs (Hydrochlorothiazide) .... Once daily 10)  Pyridoxine Hcl 25 Mg Tabs (Pyridoxine hcl) .... One tab by mouth once daily 11)  Isoniazid 300 Mg Tabs (Isoniazid) .... One tab by mouth once daily 12)  Trazodone Hcl 50 Mg Tabs (Trazodone hcl) .... One tab  by mouth at bedtime as needed 13)  Loratadine 10 Mg Tabs (Loratadine) .... One tab by mouth once daily  Other Orders: T-Anemia Panel 3  (2904)  Patient Instructions: 1)  Please schedule a follow-up appointment in 4 months. 2)  It is important that you exercise regularly at least 20 minutes 5 times a week. If you develop chest pain, have severe difficulty breathing, or feel very tired , stop exercising immediately and seek medical attention. 3)  You need to lose weight. Consider a lower calorie diet and regular exercise.  4)  CBC w/ Diff prior to visit, ICD-9:   in 4 months 5)  HbgA1C prior to visit, ICD-9: and anemia panel 6)  You wil be referred  for an [place] of your thyroid  Prescriptions: HYDROCHLOROTHIAZIDE 25 MG TABS (HYDROCHLOROTHIAZIDE) once daily  #90 x 1   Entered by:   Adella Hare LPN   Authorized by:   Syliva Overman MD   Signed by:   Adella Hare LPN on 16/05/9603   Method used:   Electronically to        [facility] [address] [phone]* (retail)       [address] [place], [place]  [address]       Ph: [phone]       Fax: [phone]   RxID:   [id number] GLIPIZIDE 5 MG XR24H-TAB (GLIPIZIDE) one tab by mouth qd  #90 x 1   Entered by:   Adella Hare LPN   Authorized by:   Syliva Overman MD   Signed by:   Adella Hare  LPN on 53/66/4403   Method used:   Electronically to        [facility] [phone]* (retail)       [address] [place], [place]  [address]       Ph: [phone]       Fax: [phone]   RxID:   [id number] METFORMIN HCL 1000 MG  TABS (METFORMIN HCL) one tab by mouth two times a day  #180 x 1   Entered by:   Adella Hare LPN   Authorized by:   Syliva Overman MD   Signed by:   Adella Hare LPN on 25/42/7062   Method used:   Electronically to        [facility] [address] [phone]* (retail)       [address] [place], [place]  [address]       Ph: [phone]       Fax: [phone]   RxID:   [id number]    Orders Added: 1)  Est. Patient Level IV [[address]] 2)  Radiology Referral [Radiology] 3)  T-Basic Metabolic Panel [[id number]] 4)  T- Hemoglobin A1C [[id number]] 5)  T-Anemia Panel 3  [2904]

## 2010-09-04 NOTE — Progress Notes (Signed)
Summary: begin Reglan  Spoek with pt RE: GES. explained she has delayed emptying. talked about management. Pt will pick up GP diet HO today. Will call me with fax/ph # for [facility]. Med side effects explained to  include tardive dyskinesia and the management. OPV after Aug 22, 2009. West Bali MD  July 24, 2009 10:59 AM  Please fax Rx to pharmacy. Pt called. Her pharmacy fax #: [phone]. West Bali MD  July 25, 2009 10:03 AM        New/Updated Medications: REGLAN 5 MG TABS (METOCLOPRAMIDE HCL) 1 by mouth 30 minute prior to breakfast and lunch Prescriptions: REGLAN 5 MG TABS (METOCLOPRAMIDE HCL) 1 by mouth 30 minute prior to breakfast and lunch  #180 x 1   Entered and Authorized by:   West Bali MD   Signed by:   West Bali MD on 07/25/2009   Method used:   Print then Give to Patient   RxID:   [id number]   Appended Document: begin Reglan Rx printed and faxed to [facility]

## 2010-09-04 NOTE — Letter (Signed)
Summary: Recall Office Visit  [facility]  [address]   [place], [place] [address]   Phone: [phone]  Fax: [phone]      May 22, 2010   [facility] [address] [place], [place]  [address] August 28, 1975   Dear [name],   According to our records, it is time for you to schedule a follow-up office visit with [place].   At your convenience, please call [phone] to schedule an office visit. If you have any questions, concerns, or feel that this letter is in error, we would appreciate your call.   Sincerely,    Rosine Beat  [facility] Ph: [phone]   Fax: [phone]

## 2010-09-04 NOTE — Letter (Signed)
Summary: MEDICAID PHARMACY PROGRAM  MEDICAID PHARMACY PROGRAM   Imported By: Lind Guest 05/30/2010 09:50:31  _____________________________________________________________________  External Attachment:    Type:   Image     Comment:   External Document
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
## 2010-09-04 NOTE — Progress Notes (Signed)
  Phone Note Outgoing Call   Call placed by: dr simpson Summary of Call: pt notified that WBC was nl. states she feels horrible, genralized aches. pain with breathing. I will er tamiflu to [facility], she is aware Initial call taken by: Syliva Overman MD,  February 26, 2009 8:22 AM    New/Updated Medications: TAMIFLU 75 MG CAPS (OSELTAMIVIR PHOSPHATE) Take 1 tablet by mouth two times a day TAMIFLU 75 MG CAPS (OSELTAMIVIR PHOSPHATE) Take 1 capsule by mouth two times a day Prescriptions: TAMIFLU 75 MG CAPS (OSELTAMIVIR PHOSPHATE) Take 1 capsule by mouth two times a day  #10 x 0   Entered and Authorized by:   Syliva Overman MD   Signed by:   Syliva Overman MD on 02/26/2009   Method used:   Electronically to        [facility] [address] [phone]* (retail)       [address] [place], [place]  [address]       Ph: [phone]       Fax: [phone]   RxID:   [id number] TAMIFLU 75 MG CAPS (OSELTAMIVIR PHOSPHATE) Take 1 tablet by mouth two times a day  #10 x 0   Entered and Authorized by:   Syliva Overman MD   Signed by:   Syliva Overman MD on 02/26/2009   Method used:   Electronically to        [facility]* (retail)       [address]       [place], [place]  [address]       Ph: [phone]       Fax: [phone]   RxID:   [id number]

## 2010-09-04 NOTE — Letter (Signed)
Summary: Out of Work  [facility]  [address]   [place], [place] [address]   Phone: [phone]  Fax: [phone]    September 24, 2009   Employee:  [employer]    To Whom It May Concern:   For Medical reasons, please excuse the above named employee from work for the following dates:  Start:   09/16/09  End:   09/18/09 to return with no restrictions  If you need additional information, please feel free to contact our office.         Sincerely,    Esperanza Sheets, [place]

## 2010-09-04 NOTE — Progress Notes (Signed)
Summary: [facility]   Imported By: Lind Guest 09/11/2009 13:22:42  _____________________________________________________________________  External Attachment:    Type:   Image     Comment:   External Document

## 2010-09-04 NOTE — Assessment & Plan Note (Signed)
Summary: F UP   Vital Signs:  Patient profile:   35 year old female Menstrual status:  regular Height:      65.5 inches (166.37 cm) Weight:      224.56 pounds (102.07 kg) BMI:     36.93 O2 Sat:      97 % Pulse rate:   87 / minute Pulse rhythm:   regular Resp:     16 per minute BP sitting:   120 / 82  Vitals Entered By: Everitt Amber (January 23, 2009 4:22 PM)  Nutrition Counseling: Patient's BMI is greater than 25 and therefore counseled on weight management options. CC: Follow up chronic problems Is Patient Diabetic? Yes   CC:  Follow up chronic problems.  History of Present Illness: Testing fastings on avg 4 times per week, they are under 130 most of the time. She denies polyuria, polydypsia, blurred vision or hypoglycemic episodes.SAhe has modified her diet and started regular exercise, up to recently when she hurt her foot. She has suceeded in weight loss, and plans to continue to lose weight . She denies any recent fevr or chills. She reports complete resolution of her respiratory symptoms, and good control of her bipolar disease.   Current Medications (verified): 1)  Metformin Hcl 1000 Mg  Tabs (Metformin Hcl) .... One Tab By Mouth Two Times A Day 2)  Hydrochlorothiazide 25 Mg  Tabs (Hydrochlorothiazide) .... One Tab By Mouth Once Daily 3)  Abilify 10 Mg Tabs (Aripiprazole) .... One Tab By Mouth Qhs 4)  Wellbutrin Xl 300 Mg Xr24h-Tab (Bupropion Hcl) .... One Tab By Mouth Every Morning 5)  Labetalol Hcl 300 Mg Tabs (Labetalol Hcl) .... Take 2 Tabs By Mouth Two Times A Day 6)  Glipizide 5 Mg Xr24h-Tab (Glipizide) .... One Tab By Mouth Qd 7)  Portia-28 0.15-0.03 Mg Tabs (Levonorgestrel-Ethinyl Estrad) .... Take 1 Tablet By Mouth Once A Day  Allergies (verified): 1)  ! Augmentin  Review of Systems      See HPI General:  Denies chills, fatigue, and fever. Eyes:  Denies blurring and discharge. ENT:  Denies hoarseness, nasal congestion, sinus pressure, and sore throat. CV:   Denies chest pain or discomfort, palpitations, and swelling of feet. Resp:  Denies cough and sputum productive. GI:  Complains of indigestion and nausea; pt was on reflux med as achild. GU:  Denies abnormal vaginal bleeding, dysuria, and urinary frequency. MS:  Denies joint pain and stiffness. Derm:  Denies itching and rash. Psych:  See HPI. Endo:  Denies cold intolerance, excessive hunger, excessive thirst, excessive urination, heat intolerance, polyuria, and weight change. Allergy:  Complains of seasonal allergies; controlled and mild.  Physical Exam  General:  alert, well-hydrated, and overweight-appearing.  HEENT: No facial asymmetry,  EOMI, No sinus tenderness, TM's Clear, oropharynx  pink and moist.   Chest: Clear to auscultation bilaterally.  CVS: S1, S2, No murmurs, No S3.   Abd: Soft, Nontender.  MS: Adequate ROM spine, hips, shoulders and knees.  Ext: No edema.   CNS: CN 2-12 intact, power tone and sensation normal throughout.   Skin: Intact, no visible lesions or rashes.  Psych: Good eye contact, normal affect.  Memory intact, not anxious or depressed appearing.    Diabetes Management Exam:    Foot Exam (with socks and/or shoes not present):       Sensory-Monofilament:          Left foot: normal          Right foot: normal  Inspection:          Left foot: normal          Right foot: normal       Nails:          Left foot: normal          Right foot: normal   Impression & Recommendations:  Problem # 1:  DIABETES MELLITUS, TYPE II (ICD-250.00) Assessment Improved  Her updated medication list for this problem includes:    Metformin Hcl 1000 Mg Tabs (Metformin hcl) ..... One tab by mouth two times a day    Glipizide 5 Mg Xr24h-tab (Glipizide) ..... One tab by mouth qd  Orders: T-Urine Microalbumin w/creat. ratio [id number] / [id number]) Glucose, (CBG) (82962) Hemoglobin A1C (83036)  Labs Reviewed: Creat: 1.05 (10/13/2008)    Reviewed HgBA1c results: 6.4  (01/23/2009)  7.1 (10/18/2008)  Problem # 2:  BIPOLAR DISORDER UNSPECIFIED (ICD-296.80) Assessment: Unchanged pt to continue on meds from mental health E[facility]oli, sh is controlled and doing well  Problem # 3:  HYPERTENSION (ICD-401.9) Assessment: Unchanged  Her updated medication list for this problem includes:    Hydrochlorothiazide 25 Mg Tabs (Hydrochlorothiazide) ..... One tab by mouth once daily    Labetalol Hcl 300 Mg Tabs (Labetalol hcl) .Marland Kitchen... Take 2 tabs by mouth two times a day    Hydrochlorothiazide 25 Mg Tabs (Hydrochlorothiazide) ..... One tab by mouth qd  BP today: 120/82 Prior BP: 110/80 (11/29/2008)  Labs Reviewed: K+: 3.9 (10/13/2008) Creat: : 1.05 (10/13/2008)   Chol: 124 (10/13/2008)   HDL: 56 (10/13/2008)   LDL: 45 (10/13/2008)   TG: 116 (10/13/2008)  Problem # 4:  OBESITY (ICD-278.00) Assessment: Improved  Ht: 65.5 (01/23/2009)   Wt: 224.56 (01/23/2009)   BMI: 36.93 (01/23/2009)  Problem # 5:  GERD (ICD-530.81) Assessment: Deteriorated  Her updated medication list for this problem includes:    Omeprazole 20 Mg Cpdr (Omeprazole) ..... One cap by mouth qd  Complete Medication List: 1)  Metformin Hcl 1000 Mg Tabs (Metformin hcl) .... One tab by mouth two times a day 2)  Hydrochlorothiazide 25 Mg Tabs (Hydrochlorothiazide) .... One tab by mouth once daily 3)  Abilify 10 Mg Tabs (Aripiprazole) .... One tab by mouth qhs 4)  Wellbutrin Xl 300 Mg Xr24h-tab (Bupropion hcl) .... One tab by mouth every morning 5)  Labetalol Hcl 300 Mg Tabs (Labetalol hcl) .... Take 2 tabs by mouth two times a day 6)  Glipizide 5 Mg Xr24h-tab (Glipizide) .... One tab by mouth qd 7)  Portia-28 0.15-0.03 Mg Tabs (Levonorgestrel-ethinyl estrad) .... Take 1 tablet by mouth once a day 8)  Omeprazole 20 Mg Cpdr (Omeprazole) .... One cap by mouth qd 9)  Hydrochlorothiazide 25 Mg Tabs (Hydrochlorothiazide) .... One tab by mouth qd  Patient Instructions: 1)  Please schedule a follow-up  appointment in 3 months. 2)  It is important that you exercise regularly at least 60 minutes 5 times a week. If you develop chest pain, have severe difficulty breathing, or feel very tired , stop exercising immediately and seek medical attention. 3)  You need to lose weight. Consider a lower calorie diet and regular exercise. Congrats on weight loss , pls keep it up. 4)  Your bP ios good, no med change. 5)  Check your feet each night for sore areas, calluses or signs of infection. 6)  See your eye doctor yearly to check for diabetic eye damage.Past due pls schedule. Prescriptions: HYDROCHLOROTHIAZIDE 25 MG TABS (HYDROCHLOROTHIAZIDE) one tab by mouth  qd  #90 x 3   Entered by:   Worthy Keeler LPN   Authorized by:   Syliva Overman MD   Signed by:   Syliva Overman MD on 01/27/2009   Method used:   Handwritten   RxID:   [id number] OMEPRAZOLE 20 MG CPDR (OMEPRAZOLE) one cap by mouth qd  #90 x 3   Entered by:   Worthy Keeler LPN   Authorized by:   Syliva Overman MD   Signed by:   Syliva Overman MD on 01/27/2009   Method used:   Handwritten   RxID:   [id number]   Laboratory Results   Blood Tests   Date/Time Received: 01/23/09 5p Date/Time Reported: 01/23/09 5p  Glucose (random): 101 mg/dL   (Normal Range: 84-696) HGBA1C: 6.4%   (Normal Range: Non-Diabetic - 3-6%   Control Diabetic - 6-8%)

## 2010-09-04 NOTE — Assessment & Plan Note (Signed)
Summary: OV   Vital Signs:  Patient profile:   35 year old female Menstrual status:  regular Height:      65.5 inches Weight:      221 pounds BMI:     36.35 O2 Sat:      97 % on Room air Temp:     98.9 degrees F oral Pulse rate:   96 / minute Pulse rhythm:   regular Resp:     16 per minute BP sitting:   120 / 80  (left arm) Cuff size:   large  Vitals Entered By: Everitt Amber (February 25, 2009 1:18 PM)  Nutrition Counseling: Patient's BMI is greater than 25 and therefore counseled on weight management options.  O2 Flow:  Room air CC: started yesterday morning and she had a fever of 102.7, sore throat and muscle aches, chills, no cough but nasal congestion dark yellow tinged with blood   CC:  started yesterday morning and she had a fever of 102.7, sore throat and muscle aches, chills, and no cough but nasal congestion dark yellow tinged with blood.  History of Present Illness: Pt reports a 2 day h/o generalized body aches aches , chills fatigue. Pt never truly had a fever at her illness, she was blowing yellow drainageup to last week, and still feels and sounds like her head is in a barrel, she also has bilateral ear pain. Temp was up to 102.7 degrees yeasterday. She denies productive cough., but does have chest pain with deep inspiration.  Current Medications (verified): 1)  Metformin Hcl 1000 Mg  Tabs (Metformin Hcl) .... One Tab By Mouth Two Times A Day 2)  Hydrochlorothiazide 25 Mg  Tabs (Hydrochlorothiazide) .... One Tab By Mouth Once Daily 3)  Abilify 10 Mg Tabs (Aripiprazole) .... One Tab By Mouth Qhs 4)  Wellbutrin Xl 300 Mg Xr24h-Tab (Bupropion Hcl) .... One Tab By Mouth Every Morning 5)  Labetalol Hcl 300 Mg Tabs (Labetalol Hcl) .... Take 2 Tabs By Mouth Two Times A Day 6)  Glipizide 5 Mg Xr24h-Tab (Glipizide) .... One Tab By Mouth Qd 7)  Portia-28 0.15-0.03 Mg Tabs (Levonorgestrel-Ethinyl Estrad) .... Take 1 Tablet By Mouth Once A Day 8)  Omeprazole 20 Mg Cpdr  (Omeprazole) .... One Cap By Mouth Qd 9)  Hydrochlorothiazide 25 Mg Tabs (Hydrochlorothiazide) .... One Tab By Mouth Qd 10)  Augmentin 875-125 Mg Tabs (Amoxicillin-Pot Clavulanate) .... Take 1 Tablet By Mouth Two Times A Day 11)  Fluconazole 150 Mg Tabs (Fluconazole) .... Take 1 Tablet By Mouth Once A Day As Needed  Allergies (verified): 1)  ! Augmentin  Review of Systems      See HPI General:  See HPI; Complains of chills, fatigue, and fever. ENT:  See HPI; Complains of nasal congestion, postnasal drainage, and sinus pressure; denies ringing in ears and sore throat. CV:  Denies chest pain or discomfort, palpitations, and swelling of feet. Resp:  Complains of cough; denies sputum productive and wheezing. GI:  Denies abdominal pain, constipation, diarrhea, nausea, and vomiting. GU:  Denies dysuria and urinary frequency. MS:  Denies joint pain and stiffness. Derm:  Denies itching and rash. Psych:  Complains of anxiety and depression; denies suicidal thoughts/plans, thoughts of violence, and unusual visions or sounds. Endo:  Denies cold intolerance, excessive hunger, excessive thirst, excessive urination, heat intolerance, polyuria, and weight change.  Physical Exam  General:  alert, well-hydrated, and overweight-appearing.  HEENT: No facial asymmetry,  EOMI,maxillary sinus tenderness, TM's Clear, oropharynx  pink and moist.  bilateral tender cervical adenitis  Chest: Clear to auscultation bilaterally.  CVS: S1, S2, No murmurs, No S3.   Abd: Soft, Nontender.  MS: Adequate ROM spine, hips, shoulders and knees.  Ext: No edema.   CNS: CN 2-12 intact, power tone and sensation normal throughout.   Skin: Intact, no visible lesions or rashes.  Psych: Good eye contact, normal affect.  Memory intact, not anxious or depressed appearing.     Impression & Recommendations:  Problem # 1:  ACUTE MAXILLARY SINUSITIS (ICD-461.0)  Her updated medication list for this problem includes:     Augmentin 875-125 Mg Tabs (Amoxicillin-pot clavulanate) .Marland Kitchen... Take 1 tablet by mouth two times a day  Orders: Radiology Referral (Radiology) T-Culture, Sputum & Gram Stain ([id number])  Problem # 2:  DIABETES MELLITUS, TYPE II (ICD-250.00) Assessment: Unchanged  Her updated medication list for this problem includes:    Metformin Hcl 1000 Mg Tabs (Metformin hcl) ..... One tab by mouth two times a day    Glipizide 5 Mg Xr24h-tab (Glipizide) ..... One tab by mouth qd  Orders: Glucose, (CBG) [id number])  Labs Reviewed: Creat: 1.05 (10/13/2008)    Reviewed HgBA1c results: 6.4 (01/23/2009)  7.1 (10/18/2008)  Problem # 3:  HYPERTENSION (ICD-401.9) Assessment: Unchanged  Her updated medication list for this problem includes:    Hydrochlorothiazide 25 Mg Tabs (Hydrochlorothiazide) ..... One tab by mouth once daily    Labetalol Hcl 300 Mg Tabs (Labetalol hcl) .Marland Kitchen... Take 2 tabs by mouth two times a day    Hydrochlorothiazide 25 Mg Tabs (Hydrochlorothiazide) ..... One tab by mouth qd  BP today: 120/80 Prior BP: 120/80 (02/06/2009)  Labs Reviewed: K+: 3.9 (10/13/2008) Creat: : 1.05 (10/13/2008)   Chol: 124 (10/13/2008)   HDL: 56 (10/13/2008)   LDL: 45 (10/13/2008)   TG: 116 (10/13/2008)  Problem # 4:  OBESITY (ICD-278.00) Assessment: Improved  Ht: 65.5 (02/25/2009)   Wt: 221 (02/25/2009)   BMI: 36.35 (02/25/2009)  Complete Medication List: 1)  Metformin Hcl 1000 Mg Tabs (Metformin hcl) .... One tab by mouth two times a day 2)  Hydrochlorothiazide 25 Mg Tabs (Hydrochlorothiazide) .... One tab by mouth once daily 3)  Abilify 10 Mg Tabs (Aripiprazole) .... One tab by mouth qhs 4)  Wellbutrin Xl 300 Mg Xr24h-tab (Bupropion hcl) .... One tab by mouth every morning 5)  Labetalol Hcl 300 Mg Tabs (Labetalol hcl) .... Take 2 tabs by mouth two times a day 6)  Glipizide 5 Mg Xr24h-tab (Glipizide) .... One tab by mouth qd 7)  Portia-28 0.15-0.03 Mg Tabs (Levonorgestrel-ethinyl estrad) ....  Take 1 tablet by mouth once a day 8)  Omeprazole 20 Mg Cpdr (Omeprazole) .... One cap by mouth qd 9)  Hydrochlorothiazide 25 Mg Tabs (Hydrochlorothiazide) .... One tab by mouth qd 10)  Augmentin 875-125 Mg Tabs (Amoxicillin-pot clavulanate) .... Take 1 tablet by mouth two times a day 11)  Fluconazole 150 Mg Tabs (Fluconazole) .... Take 1 tablet by mouth once a day as needed  Other Orders: T-CBC w/Diff ([id number])  Patient Instructions: 1)  F/U as before. 2)  CBC w/ Diff prior to visit, ICD-9: 3)  Sputum for c/s today 4)  Get plenty of rest, drink lots of clear liquids, and use Tylenol or Ibuprofen for fever and comfort. Return in 7-10 days if you're not better:sooner if you're feeling worse. 5)  Take 650-1000mg  of Tylenol every 4-6 hours as needed for relief of pain or comfort of fever AVOID taking more than 4000mg   in a 24 hour period (  can cause liver damage in higher doses). 6)  Wk excuse to return on 02/27/2009. 7)  You need a scan of your sinuses.  Laboratory Results   Blood Tests   Date/Time Received: February 25, 2009  Date/Time Reported: February 25, 2009   Glucose (random): 134 mg/dL   (Normal Range: 16-109)

## 2010-09-04 NOTE — Progress Notes (Signed)
Summary: PINWORMS  Phone Note Call from Patient   Summary of Call: HER SON WAS DIAG. WITH PINWORM AND THE PED. SAID FOR THE FAMILY TO TAKE PREVENTIVE CARE AND CALL PCP AND GET HER TO RX MEBENBAZOLE 100 MG NEEDS SENT TO [facility] IN [place] CALL BACK TO LET HER KNOW AT [phone] Initial call taken by: Lind Guest,  June 24, 2010 8:56 AM  Follow-up for Phone Call        script printed, pls let her know and fax to the pharmacy of her choice Follow-up by: Syliva Overman MD,  June 24, 2010 12:22 PM  Additional Follow-up for Phone Call Additional follow up Details #1::        Rx Called In, left message Additional Follow-up by: Adella Hare LPN,  June 24, 2010 6:13 PM    New/Updated Medications: MEBENDAZOLE 100 MG CHEW (MEBENDAZOLE) one tablet today, then repeat in 2 weeks Prescriptions: MEBENDAZOLE 100 MG CHEW (MEBENDAZOLE) one tablet today, then repeat in 2 weeks  #2 x 0   Entered and Authorized by:   Syliva Overman MD   Signed by:   Syliva Overman MD on 06/24/2010   Method used:   Printed then faxed to ...       [facility] [address] [phone]* (retail)       [address] [place]  [address]       Ph: [phone]       Fax: [phone]   RxID:   [id number]
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
## 2010-09-04 NOTE — Progress Notes (Signed)
Summary: lab order  Phone Note Call from Patient   Summary of Call: gave her a lab order form and one diag. was obesity and ins. want cover that. needs to be htn instead. [phone] Initial call taken by: Rudene Anda,  September 03, 2009 9:54 AM  Follow-up for Phone Call        returned call, left message Follow-up by: Worthy Keeler LPN,  September 03, 2009 1:59 PM  Additional Follow-up for Phone Call Additional follow up Details #1::        returned call, left message Additional Follow-up by: Worthy Keeler LPN,  September 04, 2009 9:10 AM

## 2010-09-04 NOTE — Letter (Signed)
Summary: ABD U/S ORDER  ABD U/S ORDER   Imported By: Ave Filter 07/10/2009 12:48:48  _____________________________________________________________________  External Attachment:    Type:   Image     Comment:   External Document

## 2010-09-04 NOTE — Assessment & Plan Note (Signed)
Summary: INJ  Nurse Visit   Vital Signs:  Patient Profile:   35 Years Old Female CC:      Nurse visit for injection of zofran Height:     64.5 inches O2 Sat:      98 % Pulse rate:   90 / minute Resp:     16 per minute  Vitals Entered By: Everitt Amber (September 19, 2008 11:09 AM)             Comments Patient recieved Zofran 4 mg for nausea     Prior Medications: METFORMIN HCL 1000 MG  TABS (METFORMIN HCL) one tab by mouth two times a day METHYLDOPA 500 MG  TABS (METHYLDOPA) one tab by mouth two times a day HYDROCHLOROTHIAZIDE 25 MG  TABS (HYDROCHLOROTHIAZIDE) one tab by mouth once daily KLOR-CON M20 20 MEQ  TBCR (POTASSIUM CHLORIDE CRYS CR) one tab by mouth once daily GLIPIZIDE 2.5 MG XR24H-TAB (GLIPIZIDE) one tab by mouth qd ABILIFY 10 MG TABS (ARIPIPRAZOLE) one tab by mouth qhs WELLBUTRIN XL 300 MG XR24H-TAB (BUPROPION HCL) one tab by mouth every morning TESSALON PERLES 100 MG CAPS (BENZONATATE) Take 1 capsule by mouth three times a day ERY-TAB 500 MG TBEC (ERYTHROMYCIN BASE) Take 1 tablet by mouth three times a day FLUCONAZOLE 150 MG TABS (FLUCONAZOLE) Take 1 tablet by mouth once a day CIPROFLOXACIN HCL 500 MG TABS (CIPROFLOXACIN HCL) one tab by mouth bid DIFLUCAN 150 MG TABS (FLUCONAZOLE) one tab by mouth qd Current Allergies: ! AUGMENTIN    Medication Administration  Injection # 1:    Medication: Zofran 1mg . injection    Diagnosis: NAUSEA (ICD-787.02)    Route: IM    Site: RUOQ gluteus    Exp Date: 06/2010    Lot #: [id number]    Mfr: Baxter    Comments: 4 mg given    Patient tolerated injection without complications    Given by: Everitt Amber (September 19, 2008 11:10 AM)  Orders Added: 1)  Zofran 1mg . injection [J2405] 2)  Admin of Therapeutic Inj  intramuscular or subcutaneous Lepidus.Putnam    ]

## 2010-09-04 NOTE — Progress Notes (Signed)
Summary: medication  Phone Note Call from Patient   Summary of Call: pt needs to speak with nurse about medication [phone] call in rxs to [facility] Initial call taken by: Rudene Anda,  January 08, 2010 3:34 PM  Follow-up for Phone Call        SENT RX AS REQUESTED BY PATIENT  Follow-up by: Adella Hare LPN,  January 08, 6294 4:05 PM    Prescriptions: GLIPIZIDE 5 MG XR24H-TAB (GLIPIZIDE) one tab by mouth qd  #30 x 0   Entered by:   Adella Hare LPN   Authorized by:   Syliva Overman MD   Signed by:   Adella Hare LPN on 28/41/3244   Method used:   Electronically to        [facility] [address] [phone]* (retail)       [address] [place], [place]  [address]       Ph: [phone]       Fax: [phone]   RxID:   [id number] METFORMIN HCL 1000 MG  TABS (METFORMIN HCL) one tab by mouth two times a day  #60 x 0   Entered by:   Adella Hare LPN   Authorized by:   Syliva Overman MD   Signed by:   Adella Hare LPN on 08/11/3233   Method used:   Electronically to        [facility] [address] [phone]* (retail)       [address], [place]  [address]       Ph: [phone]       Fax: [phone]   RxID:   [id number]

## 2010-09-04 NOTE — Assessment & Plan Note (Signed)
Summary: SICK- room 3   Vital Signs:  Patient profile:   35 year old female Menstrual status:  regular Height:      65.5 inches Weight:      219.50 pounds BMI:     36.10 O2 Sat:      98 % on Room air Pulse rate:   99 / minute Resp:     16 per minute BP sitting:   118 / 70  (left arm)  Vitals Entered By: Adella Hare LPN (October 01, 1476 3:37 PM) CC: sinus pressure, fever, chills, body aches, yellow drainage- x 3 days Is Patient Diabetic? Yes Did you bring your meter with you today? No Pain Assessment Patient in pain? no      Comments states ortho put her on prednisone today but doesnt know dose   Primary Provider:  Lodema Hong  CC:  sinus pressure, fever, chills, body aches, and yellow drainage- x 3 days.  History of Present Illness: Pt is here today with c/o sinus infection.  She has a hx of sinus infections & states that she hasn't gotten allergy injections in 3 wks.  When she doesn't get her allergy injections she gets sinus infections.  c/o sinus pressure, congestion & drainage. Mucus is thick is discolored. some cough, but nonproductive.  Has tried otc cold meds but seem to make her more plugged up.  Allergies (verified): 1)  ! Augmentin  Past History:  Past medical history reviewed for relevance to current acute and chronic problems.  Past Medical History: Reviewed history from 06/03/2009 and no changes required. Current Problems:  BIPOLAR DISORDER UNSPECIFIED (ICD-296.80) OBESITY (ICD-278.00) HYPERTENSION (ICD-401.9) DIABETES MELLITUS, TYPE I (ICD-250.01) GERD  Review of Systems General:  Denies chills and fever. ENT:  Complains of nasal congestion, postnasal drainage, and sinus pressure; denies earache and sore throat. CV:  Denies chest pain or discomfort. Resp:  Complains of cough; denies shortness of breath, sputum productive, and wheezing. Heme:  Denies enlarge lymph nodes.  Physical Exam  General:  Well-developed,well-nourished,in no acute distress;  alert,appropriate and cooperative throughout examination Head:  Normocephalic and atraumatic without obvious abnormalities. No apparent alopecia or balding. Ears:  External ear exam shows no significant lesions or deformities.  Otoscopic examination reveals clear canals, tympanic membranes are intact bilaterally without bulging, retraction, inflammation or discharge. Hearing is grossly normal bilaterally. Nose:  no external deformity, external erythema, mucosal erythema, mucosal edema, L frontal sinus tenderness, L maxillary sinus tenderness, R frontal sinus tenderness, and R maxillary sinus tenderness.   Mouth:  Oral mucosa and oropharynx without lesions or exudates.  Teeth in good repair. Neck:  No deformities, masses, or tenderness noted. Lungs:  Normal respiratory effort, chest expands symmetrically. Lungs are clear to auscultation, no crackles or wheezes. Heart:  Normal rate and regular rhythm. S1 and S2 normal without gallop, murmur, click, rub or other extra sounds. Cervical Nodes:  No lymphadenopathy noted Psych:  Cognition and judgment appear intact. Alert and cooperative with normal attention span and concentration. No apparent delusions, illusions, hallucinations   Impression & Recommendations:  Problem # 1:  SINUSITIS (ICD-473.9) Assessment New  Her updated medication list for this problem includes:    Tessalon Perles 100 Mg Caps (Benzonatate) .Marland Kitchen... Take 1 tablet by mouth three times a day    Cefdinir 300 Mg Caps (Cefdinir) .Marland Kitchen... 2 caps once daily x 10 days  Problem # 2:  HYPERTENSION (ICD-401.9) Assessment: Unchanged  Her updated medication list for this problem includes:    Labetalol Hcl  300 Mg Tabs (Labetalol hcl) .Marland Kitchen... Take 2 tabs by mouth two times a day  BP today: 118/70 Prior BP: 118/80 (09/19/2009)  Labs Reviewed: K+: 3.9 (10/13/2008) Creat: : 1.05 (10/13/2008)   Chol: 124 (10/13/2008)   HDL: 56 (10/13/2008)   LDL: 45 (10/13/2008)   TG: 116 (10/13/2008)  Problem  # 3:  ALLERGY (ICD-995.3) Resume allergy injections when infection improves.   Complete Medication List: 1)  Metformin Hcl 1000 Mg Tabs (Metformin hcl) .... One tab by mouth two times a day 2)  Abilify 10 Mg Tabs (Aripiprazole) .... One tab by mouth qhs 3)  Wellbutrin Xl 300 Mg Xr24h-tab (Bupropion hcl) .... One tab by mouth every morning 4)  Labetalol Hcl 300 Mg Tabs (Labetalol hcl) .... Take 2 tabs by mouth two times a day 5)  Glipizide 5 Mg Xr24h-tab (Glipizide) .... One tab by mouth qd 6)  Portia-28 0.15-0.03 Mg Tabs (Levonorgestrel-ethinyl estrad) .... Take 1 tablet by mouth once a day 7)  Omeprazole 40 Mg Cpdr (Omeprazole) .... Take 1 capsule by mouth once a day 8)  Reglan 5 Mg Tabs (Metoclopramide hcl) .Marland Kitchen.. 1 by mouth 30 minute prior to breakfast and lunch 9)  Tessalon Perles 100 Mg Caps (Benzonatate) .... Take 1 tablet by mouth three times a day 10)  Robaxin 500 Mg Tabs (Methocarbamol) .... 2 tabs q 6 hrs as needed muscle spasm 11)  Ibuprofen 800 Mg Tabs (Ibuprofen) .Marland Kitchen.. 1 three times a day as needed 12)  Cefdinir 300 Mg Caps (Cefdinir) .... 2 caps once daily x 10 days 13)  Diflucan 150 Mg Tabs (Fluconazole) .Marland Kitchen.. 1 by mouth as needed yeast infection  Patient Instructions: 1)  Please schedule a follow-up appointment as needed. 2)  Get plenty of rest, drink lots of clear liquids, and use Tylenol or Ibuprofen for fever and comfort. Return in 7-10 days if you're not better:sooner if you're feeling worse. 3)  Warm compresses to the sinuses as needed to help with pressure & congestion. 4)  I prescribed an antibiotic for you to the pharmacy. Prescriptions: DIFLUCAN 150 MG TABS (FLUCONAZOLE) 1 by mouth as needed yeast infection  #1 x 0   Entered and Authorized by:   Esperanza Sheets PA   Signed by:   Esperanza Sheets PA on 10/01/2009   Method used:   Electronically to        [facility] [address] [phone]* (retail)       [address] [place]  [address]       Ph: [phone]        Fax: [phone]   RxID:   [id number] CEFDINIR 300 MG CAPS (CEFDINIR) 2 caps once daily x 10 days  #20 x 0   Entered and Authorized by:   Esperanza Sheets PA   Signed by:   Esperanza Sheets PA on 10/01/2009   Method used:   Electronically to        [facility] [address] [phone]* (retail)       [address] [place]  [address]       Ph: [phone]       Fax: [phone]   RxID:   [id number]

## 2010-09-04 NOTE — Assessment & Plan Note (Signed)
Summary: Vitals from OV 09/02/2009  Nurse Visit   Vital Signs:  Patient profile:   35 year old female Menstrual status:  regular Height:      65.5 inches Weight:      218 pounds BMI:     35.85 Temp:     97.6 degrees F oral Pulse rate:   72 / minute BP sitting:   118 / 82  (left arm) Cuff size:   large  Vitals Entered By: Cloria Spring LPN (September 03, 2009 2:55 PM) Vital Allergies: 1)  ! Augmentin

## 2010-09-04 NOTE — Letter (Signed)
Summary: Letter  Letter   Imported By: Rudene Anda 09/27/2009 14:22:07  _____________________________________________________________________  External Attachment:    Type:   Image     Comment:   External Document

## 2010-09-04 NOTE — Letter (Signed)
Summary: OUT PT NUTRITION COUNSELING  OUT PT NUTRITION COUNSELING   Imported By: Diana Eves 10/31/2009 10:56:48  _____________________________________________________________________  External Attachment:    Type:   Image     Comment:   External Document

## 2010-09-04 NOTE — Progress Notes (Signed)
Summary: lab  Phone Note Call from Patient   Summary of Call: needs lab order faxed back over to lab. please don't use obesity diag. [phone] Initial call taken by: Rudene Anda,  Dec 02, 2009 3:47 PM  Follow-up for Phone Call        Phone Call Completed, lab ordered Follow-up by: Adella Hare LPN,  Dec 02, 1189 3:58 PM

## 2010-09-04 NOTE — Assessment & Plan Note (Signed)
Summary: FU 0V2 MOS,NAUSEA/VOMITING,DIARRHEA/GU   Visit Type:  Follow-up Visit Primary Care Provider:  Lodema Hong  Chief Complaint:  F/U nausea/vomiting/diarrhea.  History of Present Illness: 35 y/o black female w/ gastroparesis.  Started reglan 5mg  before breakfast & lunch daily.  Feels much better.  Saw dietician and feels pleased.   Denies any side effects from reglan.  The patient denies abdominal pain, abdominal cramps, constipation, diarrhea, bloating, nausea, vomiting, heartburn, reflux, and early satiety.  Wt stable.     Current Problems (verified): 1)  Gastroparesis  (ICD-536.3) 2)  Amenorrhea  (ICD-626.0) 3)  Other Dysphagia  (ICD-787.29) 4)  Change in Bowels  (ICD-787.99) 5)  Vomiting  (ICD-787.03) 6)  Acute Maxillary Sinusitis  (ICD-461.0) 7)  Gerd  (ICD-530.81) 8)  Diabetes Mellitus, Type II  (ICD-250.00) 9)  Bipolar Disorder Unspecified  (ICD-296.80) 10)  Obesity  (ICD-278.00) 11)  Hypertension  (ICD-401.9)  Current Medications (verified): 1)  Metformin Hcl 1000 Mg  Tabs (Metformin Hcl) .... One Tab By Mouth Two Times A Day 2)  Abilify 10 Mg Tabs (Aripiprazole) .... One Tab By Mouth Qhs 3)  Wellbutrin Xl 300 Mg Xr24h-Tab (Bupropion Hcl) .... One Tab By Mouth Every Morning 4)  Labetalol Hcl 300 Mg Tabs (Labetalol Hcl) .... Take 2 Tabs By Mouth Two Times A Day 5)  Glipizide 5 Mg Xr24h-Tab (Glipizide) .... One Tab By Mouth Qd 6)  Portia-28 0.15-0.03 Mg Tabs (Levonorgestrel-Ethinyl Estrad) .... Take 1 Tablet By Mouth Once A Day 7)  Omeprazole 40 Mg Cpdr (Omeprazole) .... Take 1 Capsule By Mouth Once A Day 8)  Reglan 5 Mg Tabs (Metoclopramide Hcl) .Marland Kitchen.. 1 By Mouth 30 Minute Prior To Breakfast and Lunch  Allergies (verified): 1)  ! Augmentin  Review of Systems      See HPI General:  Denies fever, chills, sweats, anorexia, fatigue, weakness, malaise, weight loss, and sleep disorder. CV:  Denies chest pains, angina, palpitations, syncope, dyspnea on exertion, orthopnea,  PND, peripheral edema, and claudication. Resp:  Denies dyspnea at rest, dyspnea with exercise, cough, sputum, wheezing, coughing up blood, and pleurisy. GI:  Denies difficulty swallowing, pain on swallowing, nausea, indigestion/heartburn, vomiting, vomiting blood, abdominal pain, jaundice, gas/bloating, diarrhea, constipation, change in bowel habits, bloody BM's, black BMs, and fecal incontinence. Derm:  Denies rash, itching, dry skin, hives, moles, warts, and unhealing ulcers.  Physical Exam  General:  Well developed, well nourished, no acute distress. Head:  Normocephalic and atraumatic. Eyes:  Sclera clear, no icterus. Ears:  Normal auditory acuity. Nose:  No deformity, discharge,  or lesions. Mouth:  No deformity or lesions, dentition normal. Neck:  Supple; no masses or thyromegaly. Heart:  Regular rate and rhythm; no murmurs, rubs,  or bruits. Abdomen:  Soft, nontender and nondistended. No masses, hepatosplenomegaly or hernias noted. Normal bowel sounds.without guarding and without rebound.   Msk:  Symmetrical with no gross deformities. Normal posture. Pulses:  Normal pulses noted. Extremities:  No clubbing, cyanosis, edema or deformities noted. Neurologic:  Alert and  oriented x4;  grossly normal neurologically. Skin:  Intact without significant lesions or rashes. Cervical Nodes:  No significant cervical adenopathy. Psych:  Alert and cooperative. Normal mood and affect.  Impression & Recommendations:  Problem # 1:  GASTROPARESIS (ICD-536.3) 35 y/o black female w gastroparesis.  Doing well on low-dose reglan without side effects. Orders: Est. Patient Level III ([address])  Problem # 2:  GERD (ICD-530.81) Well controlled on omeprazole  Patient Instructions: 1)  Continue low dose reglan 5mg  two times a day  2)  Please schedule a follow-up appointment in 4 months w/ Dr Darrick Penna.  3)  Call if any side effects or other problems 4)  The medication list was reviewed and reconciled.  All  changed / newly prescribed medications were explained.  A complete medication list was provided to the patient / caregiver.  Appended Document: FU 0V2 MOS,NAUSEA/VOMITING,DIARRHEA/GU SEP 2010: 222 LBS

## 2010-09-04 NOTE — Letter (Signed)
Summary: NUTRITIONAL REFERRAL  NUTRITIONAL REFERRAL   Imported By: Ave Filter 08/15/2009 17:08:38  _____________________________________________________________________  External Attachment:    Type:   Image     Comment:   External Document

## 2010-09-04 NOTE — Assessment & Plan Note (Signed)
Summary: OV   Vital Signs:  Patient Profile:   35 Years Old Female Height:     64.5 inches Weight:      240 pounds BMI:     40.71 O2 Sat:      97 % Pulse rate:   89 / minute Resp:     16 per minute BP sitting:   122 / 80  (left arm)  Vitals Entered By: Everitt Amber (September 13, 2008 3:53 PM)                 Chief Complaint:  2 weeks off and on, nausea, vomiting, diarrhea, and indigestion. Marland Kitchen  History of Present Illness: gI symptoms for 2 weeks, which never really cleared. yesterday stool was still watery, none today. Pt is still nauseated, she last vomitted yesterday.she has been using phenergan on 3 occasions and has used immodium only twice. She denies any fever but has had chills. SDhe denies head or chest congestion. Sh e denies mood instability. she denies uncontrolled blood sugars and tests fairly regularly.    Current Allergies: ! AUGMENTIN    Risk Factors:  Tobacco use:  never Drug use:  no Alcohol use:  no Seatbelt use:  100 %  PAP Smear History:    Date of Last PAP Smear:  10/18/2006   Review of Systems  ENT      Denies earache, nasal congestion, postnasal drainage, sinus pressure, and sore throat.  CV      Denies chest pain or discomfort, palpitations, and swelling of feet.  Resp      See HPI      Denies cough, shortness of breath, sputum productive, and wheezing.  GI      See HPI  MS      Denies joint pain and stiffness.  Derm      Denies itching, lesion(s), and rash.  Neuro      Denies headaches and sensation of room spinning.  Psych      Complains of anxiety and depression.      Denies suicidal thoughts/plans, thoughts of violence, and unusual visions or sounds.  Endo      Denies excessive thirst and polyuria.   Physical Exam  General:     HEENT: No facial asymmetry,  EOMI, No sinus tenderness, TM's Clear, oropharynx  pink and moist.   Chest: Clear to auscultation bilaterally.  CVS: S1, S2, No murmurs, No S3.   Abd:  Soft,Diffuse superficial tenderness. Hyperactive bowel sounds MS: Adequate ROM spine, hips, shoulders and knees.  Ext: No edema.   CNS: CN 2-12 intact, power tone and sensation normal throughout.   Skin: Intact, no visible lesions or rashes.  Psych: Good eye contact, normal effect.  Memory intact, not anxious or depressed appearing.     Impression & Recommendations:  Problem # 1:  NAUSEA (ICD-787.02) Assessment: Comment Only  Orders: Zofran 1mg . injection (Z6109) Admin of Therapeutic Inj  intramuscular or subcutaneous (60454)   Problem # 2:  BIPOLAR DISORDER UNSPECIFIED (ICD-296.80) Assessment: Unchanged  Problem # 3:  OBESITY (ICD-278.00) Assessment: Deteriorated  Orders: T-Hepatic Function (09811-91478) T-Lipid Profile (29562-13086)   Problem # 4:  HYPERTENSION (ICD-401.9) Assessment: Improved  Her updated medication list for this problem includes:    Methyldopa 500 Mg Tabs (Methyldopa) ..... One tab by mouth two times a day    Hydrochlorothiazide 25 Mg Tabs (Hydrochlorothiazide) ..... One tab by mouth once daily  Orders: T-Basic Metabolic Panel (57846-96295)  BP today: 122/80 Prior BP: 140/100 (07/04/2008)  Labs Reviewed: Creat: 1.14 (10/08/2007) Chol: 110 (10/08/2007)   HDL: 51 (10/08/2007)   LDL: 40 (10/08/2007)   TG: 95 (10/08/2007)   Problem # 5:  DIABETES MELLITUS, TYPE II (ICD-250.00) Assessment: Unchanged  Her updated medication list for this problem includes:    Metformin Hcl 1000 Mg Tabs (Metformin hcl) ..... One tab by mouth two times a day    Glipizide 2.5 Mg Xr24h-tab (Glipizide) ..... One tab by mouth qd  Labs Reviewed: HgBA1c: 6.2 (07/04/2008)   Creat: 1.14 (10/08/2007)   Microalbumin: 17.70 (01/10/2008)   Problem # 6:  GASTROENTERITIS, ACUTE (ICD-558.9) Assessment: Comment Only  Complete Medication List: 1)  Metformin Hcl 1000 Mg Tabs (Metformin hcl) .... One tab by mouth two times a day 2)  Methyldopa 500 Mg Tabs (Methyldopa) ....  One tab by mouth two times a day 3)  Hydrochlorothiazide 25 Mg Tabs (Hydrochlorothiazide) .... One tab by mouth once daily 4)  Klor-con M20 20 Meq Tbcr (Potassium chloride crys cr) .... One tab by mouth once daily 5)  Glipizide 2.5 Mg Xr24h-tab (Glipizide) .... One tab by mouth qd 6)  Abilify 10 Mg Tabs (Aripiprazole) .... One tab by mouth qhs 7)  Wellbutrin Xl 300 Mg Xr24h-tab (Bupropion hcl) .... One tab by mouth every morning 8)  Tessalon Perles 100 Mg Caps (Benzonatate) .... Take 1 capsule by mouth three times a day 9)  Ery-tab 500 Mg Tbec (Erythromycin base) .... Take 1 tablet by mouth three times a day 10)  Fluconazole 150 Mg Tabs (Fluconazole) .... Take 1 tablet by mouth once a day 11)  Ciprofloxacin Hcl 500 Mg Tabs (Ciprofloxacin hcl) .... One tab by mouth bid 12)  Diflucan 150 Mg Tabs (Fluconazole) .... One tab by mouth qd  Other Orders: T-CBC w/Diff ([id number]) T-TSH [id number])   Patient Instructions: 1)  f/U as before. 2)  you are being treated for acute gastroenteritis.pls continue to practice hand washing after each bM. 3)  pls ensure you get adequate fluid intake. 4)  BMP prior to visit, ICD-9: 5)  Hepatic Panel prior to visit, ICD-9: 6)  Lipid Panel prior to visit, ICD-9:       fasting in march 7)  TSH prior to visit, ICD-9: 8)  CBC w/ Diff prior to visit, ICD-9:   Prescriptions: DIFLUCAN 150 MG TABS (FLUCONAZOLE) one tab by mouth qd  #1 x 0   Entered by:   Worthy Keeler LPN   Authorized by:   Syliva Overman MD   Signed by:   Syliva Overman MD on 09/16/2008   Method used:   Handwritten   RxID:   [id number] CIPROFLOXACIN HCL 500 MG TABS (CIPROFLOXACIN HCL) one tab by mouth bid  #10 x 0   Entered by:   Worthy Keeler LPN   Authorized by:   Syliva Overman MD   Signed by:   Syliva Overman MD on 09/16/2008   Method used:   Handwritten   RxID:   [id number]   Laboratory Results   Blood Tests   Date/Time Received: September 13, 2008    Date/Time Reported: September 13, 2008   Glucose (random): 126 mg/dL   (Normal Range: 72-536)      Medication Administration  Injection # 1:    Medication: Zofran 1mg . injection    Diagnosis: NAUSEA (ICD-787.02)    Route: IM    Site: RUOQ gluteus    Exp Date: 11/11    Lot #: [id number]    Mfr: [email or web address]    Patient tolerated injection without  complications    Given by: Worthy Keeler LPN (September 13, 2008 5:18 PM)  Orders Added: 1)  Glucose, (CBG) [82962] 2)  Est. Patient Level IV [04540] 3)  T-Basic Metabolic Panel [80048-22910] 4)  T-Hepatic Function [80076-22960] 5)  T-Lipid Profile [80061-22930] 6)  T-CBC w/Diff [98119-14782] 7)  T-TSH [95621-30865] 8)  Zofran 1mg . injection [J2405] 9)  Admin of Therapeutic Inj  intramuscular or subcutaneous [78469]

## 2010-09-04 NOTE — Progress Notes (Signed)
Summary: returning phone call  Phone Note Call from Patient   Summary of Call: returning phone call [phone] Initial call taken by: Rudene Anda,  September 04, 2009 11:25 AM  Follow-up for Phone Call        patient had obsesiy listed as a diag code and insurance won't cover that. Re ordered labs with correct code and sent to lab for her. Patient aware Follow-up by: Everitt Amber,  September 04, 2009 1:22 PM  New Problems: SCREENING FOR LIPOID DISORDERS (ICD-V77.91) FATIGUE (ICD-780.79)   New Problems: SCREENING FOR LIPOID DISORDERS (ICD-V77.91) FATIGUE (ICD-780.79)

## 2010-09-04 NOTE — Progress Notes (Signed)
Summary: rx  Phone Note Call from Patient   Summary of Call: pts states that [facility] don't have rx. [phone]  cell [phone] Initial call taken by: Rudene Anda,  September 17, 2009 9:01 AM  Follow-up for Phone Call        left detailed message for patient Follow-up by: Lilyan Gilford LPN,  September 17, 2009 9:10 AM

## 2010-09-04 NOTE — Assessment & Plan Note (Signed)
Summary: office visit   Vital Signs:  Patient profile:   35 year old female Menstrual status:  regular Height:      65.5 inches Weight:      222.06 pounds BMI:     36.52 Pulse rate:   91 / minute Pulse rhythm:   regular Resp:     16 per minute BP sitting:   130 / 80  (left arm)  Vitals Entered By: Worthy Keeler LPN (April 30, 2009 4:29 PM)  Nutrition Counseling: Patient's BMI is greater than 25 and therefore counseled on weight management options. CC: follow-up visit Is Patient Diabetic? Yes  Pain Assessment Patient in pain? no        CC:  follow-up visit.  History of Present Illness: Pt reports uncontrolled vomittingfo approx 6 months. No diarreah, sometines craming abdominal pain as if her stomach is in spasms, wants to and needs to see Gi. Shehas had no recent fever or chills. She denies head or chest congestion. Shedenies dysuria or frequency. Shedenies joint pain or stiffness. She denies symptoms of uncontrolled blood sugars and has not been testing regularly.sAhe enjoys her job, and denes depression,anxiety orhllucintion.   Current Medications (verified): 1)  Metformin Hcl 1000 Mg  Tabs (Metformin Hcl) .... One Tab By Mouth Two Times A Day 2)  Abilify 10 Mg Tabs (Aripiprazole) .... One Tab By Mouth Qhs 3)  Wellbutrin Xl 300 Mg Xr24h-Tab (Bupropion Hcl) .... One Tab By Mouth Every Morning 4)  Labetalol Hcl 300 Mg Tabs (Labetalol Hcl) .... Take 2 Tabs By Mouth Two Times A Day 5)  Glipizide 5 Mg Xr24h-Tab (Glipizide) .... One Tab By Mouth Qd 6)  Portia-28 0.15-0.03 Mg Tabs (Levonorgestrel-Ethinyl Estrad) .... Take 1 Tablet By Mouth Once A Day 7)  Omeprazole 20 Mg Cpdr (Omeprazole) .... One Cap By Mouth Qd 8)  Hydrochlorothiazide 25 Mg Tabs (Hydrochlorothiazide) .... One Tab By Mouth Qd  Allergies (verified): 1)  ! Augmentin  Review of Systems      See HPI General:  See HPI; Complains of fatigue and loss of appetite. ENT:  Denies hoarseness, nasal congestion,  sinus pressure, and sore throat. CV:  Denies chest pain or discomfort, palpitations, and swelling of feet. Resp:  Denies cough and sputum productive. GI:  See HPI; Complains of change in bowel habits, nausea, and vomiting; 7 month. GU:  Denies dysuria and urinary frequency. MS:  Denies joint pain and stiffness. Psych:  See HPI. Endo:  See HPI. Allergy:  Complains of seasonal allergies.  Physical Exam  General:  alert, well-hydrated, and overweight-appearing.  HEENT: No facial asymmetry,  EOMI, No sinus tenderness, TM's Clear, oropharynx  pink and moist.   Chest: Clear to auscultation bilaterally.  CVS: S1, S2, No murmurs, No S3.   Abd: Soft, Nontender.  MS: Adequate ROM spine, hips, shoulders and knees.  Ext: No edema.   CNS: CN 2-12 intact, power tone and sensation normal throughout.   Skin: Intact, no visible lesions or rashes.  Psych: Good eye contact, normal affect.  Memory intact, not anxious or depressed appearing.     Impression & Recommendations:  Problem # 1:  VOMITING (ICD-787.03) Assessment Unchanged  Orders: Gastroenterology Referral (GI)  Problem # 2:  DIABETES MELLITUS, TYPE II (ICD-250.00) Assessment: Deteriorated  Her updated medication list for this problem includes:    Metformin Hcl 1000 Mg Tabs (Metformin hcl) ..... One tab by mouth two times a day    Glipizide 5 Mg Xr24h-tab (Glipizide) ..... One tab by mouth  qd  Labs Reviewed: Creat: 1.05 (10/13/2008)    Reviewed HgBA1c results: 6.6 (04/30/2009)  6.4 (01/23/2009)  Orders: Glucose, (CBG) (82962) Hemoglobin A1C (83036)  Problem # 3:  BIPOLAR DISORDER UNSPECIFIED (ICD-296.80) Assessment: Improved pt to continue with psych  Problem # 4:  OBESITY (ICD-278.00) Assessment: Unchanged  Ht: 65.5 (04/30/2009)   Wt: 222.06 (04/30/2009)   BMI: 36.52 (04/30/2009)  Problem # 5:  HYPERTENSION (ICD-401.9) Assessment: Unchanged  The following medications were removed from the medication list:     Hydrochlorothiazide 25 Mg Tabs (Hydrochlorothiazide) ..... One tab by mouth once daily Her updated medication list for this problem includes:    Labetalol Hcl 300 Mg Tabs (Labetalol hcl) .Marland Kitchen... Take 2 tabs by mouth two times a day    Hydrochlorothiazide 25 Mg Tabs (Hydrochlorothiazide) ..... One tab by mouth qd  BP today: 130/80 Prior BP: 120/80 (02/25/2009)  Labs Reviewed: K+: 3.9 (10/13/2008) Creat: : 1.05 (10/13/2008)   Chol: 124 (10/13/2008)   HDL: 56 (10/13/2008)   LDL: 45 (10/13/2008)   TG: 116 (10/13/2008)  Problem # 6:  GERD (ICD-530.81) Assessment: Unchanged  The following medications were removed from the medication list:    Omeprazole 20 Mg Cpdr (Omeprazole) ..... One cap by mouth qd Her updated medication list for this problem includes:    Omeprazole 40 Mg Cpdr (Omeprazole) .Marland Kitchen... Take 1 capsule by mouth once a day  Complete Medication List: 1)  Metformin Hcl 1000 Mg Tabs (Metformin hcl) .... One tab by mouth two times a day 2)  Abilify 10 Mg Tabs (Aripiprazole) .... One tab by mouth qhs 3)  Wellbutrin Xl 300 Mg Xr24h-tab (Bupropion hcl) .... One tab by mouth every morning 4)  Labetalol Hcl 300 Mg Tabs (Labetalol hcl) .... Take 2 tabs by mouth two times a day 5)  Glipizide 5 Mg Xr24h-tab (Glipizide) .... One tab by mouth qd 6)  Portia-28 0.15-0.03 Mg Tabs (Levonorgestrel-ethinyl estrad) .... Take 1 tablet by mouth once a day 7)  Hydrochlorothiazide 25 Mg Tabs (Hydrochlorothiazide) .... One tab by mouth qd 8)  Omeprazole 40 Mg Cpdr (Omeprazole) .... Take 1 capsule by mouth once a day  Other Orders: Influenza Vaccine NON MCR (60454)  Patient Instructions: 1)  Please schedule a follow-up appointment in 4 months. 2)  It is important that you exercise regularly at least 20 minutes 5 times a week. If you develop chest pain, have severe difficulty breathing, or feel very tired , stop exercising immediately and seek medical attention. 3)  You need to lose weight. Consider a  lower calorie diet and regular exercise. You are being referred to Gi about chronic vomitting. 4)  Your omeprazole is inc to 40mg  dily, take two 20mg  tabs daily till you get these. 5)  Your kidney test is MUCH better, we will fax the report Prescriptions: OMEPRAZOLE 40 MG CPDR (OMEPRAZOLE) Take 1 capsule by mouth once a day  #90 x 3   Entered and Authorized by:   Syliva Overman MD   Signed by:   Syliva Overman MD on 04/30/2009   Method used:   Printed then faxed to ...       [facility]* (retail)       [address]       [place], [place]  [address]       Ph: [phone]       Fax: [phone]   RxID:   [id number]   Laboratory Results   Blood  Tests   Date/Time Received: 04/30/09 4:32p Date/Time Reported: 04/30/09 4:32p  Glucose (random): 128 mg/dL   (Normal Range: 16-109) HGBA1C: 6.6%   (Normal Range: Non-Diabetic - 3-6%   Control Diabetic - 6-8%)      Influenza Vaccine    Vaccine Type: Fluvax Non-MCR    Site: left deltoid    Mfr: [email or web address]    Dose: 0.5 ml    Route: IM    Given by: Worthy Keeler LPN    Exp. Date: 04/11    Lot #: [id number]    VIS given: 02/24/07 version given April 30, 2009.

## 2010-09-04 NOTE — Assessment & Plan Note (Signed)
Summary: OV   Vital Signs:  Patient profile:   35 year old female Menstrual status:  regular Height:      65.5 inches (166.37 cm) Weight:      233.56 pounds (106.16 kg) BMI:     38.41 Pulse rate:   94 / minute Pulse rhythm:   regular Resp:     16 per minute BP sitting:   110 / 80  (left arm)  Vitals Entered By: Worthy Keeler LPN (November 29, 2008 2:08 PM)  Nutrition Counseling: Patient's BMI is greater than 25 and therefore counseled on weight management options. CC: headache, sore throat, nasal drainage dark yellow and bloody, some fever, some chills, for 1 week Is Patient Diabetic? Yes  Pain Assessment Patient in pain? no        CC:  headache, sore throat, nasal drainage dark yellow and bloody, some fever, some chills, and for 1 week.  History of Present Illness: 5 day h/o progressive head and chest congestion, with fevr , chills, body aches and sore throat. Pt was treated at Urgent Care, however her symptoms persist. She denies polyuria, polydypsia or blurred vision or hypoglycemic episodes. fasting sugars are seldom over 120. She denies depression, anxiety or emotional instability and continues to be followed by psych. shehas modified her diet with successful weight loss, she will strt an exercise program in the near future.  Allergies: 1)  ! Augmentin  Review of Systems General:  See HPI; Complains of chills, fatigue, and fever. Eyes:  Denies vision loss-both eyes. ENT:  See HPI; Complains of nasal congestion, postnasal drainage, sinus pressure, and sore throat. CV:  Denies difficulty breathing while lying down, palpitations, and swelling of feet. Resp:  See HPI; Complains of cough, shortness of breath, and sputum productive; denies wheezing. GI:  Complains of abdominal pain, gas, and indigestion; denies constipation, diarrhea, nausea, and vomiting; c/o ydspepsia and bloating, her gall bladder studies are negative, will check H pylori. GU:  Denies dysuria and urinary  frequency. MS:  Denies joint pain and stiffness. Derm:  Denies itching, lesion(s), and rash. Psych:  See HPI. Endo:  Denies cold intolerance, excessive hunger, excessive thirst, excessive urination, heat intolerance, polyuria, and weight change.  Physical Exam  General:  alert, well-hydrated, and overweight-appearing.  HEENT: No facial asymmetry,  EOMI,frontal and maxillary sinus tenderness, TM's Clear, oropharynx  pink and moist.   Chest: decreased air entry, bilateral crackles CVS: S1, S2, No murmurs, No S3.   Abd: Soft, Nontender.  MS: Adequate ROM spine, hips, shoulders and knees.  Ext: No edema.   CNS: CN 2-12 intact, power tone and sensation normal throughout.   Skin: Intact, no visible lesions or rashes.  Psych: Good eye contact, normal affect.  Memory intact, not anxious or depressed appearing.     Impression & Recommendations:  Problem # 1:  DIABETES MELLITUS, TYPE II (ICD-250.00) Assessment Comment Only  Her updated medication list for this problem includes:    Metformin Hcl 1000 Mg Tabs (Metformin hcl) ..... One tab by mouth two times a day    Glipizide 5 Mg Xr24h-tab (Glipizide) ..... One tab by mouth qd  Orders: Glucose, (CBG) [phone])  Labs Reviewed: Creat: 1.05 (10/13/2008)    Reviewed HgBA1c results: 7.1 (10/18/2008)  6.2 (07/04/2008)  Problem # 2:  SINUSITIS, ACUTE (ICD-461.9) Assessment: Comment Only  Orders: Rocephin  250mg  (X9147) Admin of Therapeutic Inj  intramuscular or subcutaneous (82956), levaquin prescribed also  Problem # 3:  BIPOLAR DISORDER UNSPECIFIED (ICD-296.80) Assessment: Improved continue  meds per psych  Problem # 4:  OBESITY (ICD-278.00) Assessment: Improved  Ht: 65.5 (11/29/2008)   Wt: 233.56 (11/29/2008)   BMI: 38.41 (11/29/2008)  Problem # 5:  HYPERTENSION (ICD-401.9) Assessment: Unchanged  Her updated medication list for this problem includes:    Hydrochlorothiazide 25 Mg Tabs (Hydrochlorothiazide) ..... One tab by  mouth once daily    Labetalol Hcl 300 Mg Tabs (Labetalol hcl) .Marland Kitchen... Take 2 tabs by mouth two times a day  BP today: 110/80 Prior BP: 110/82 (10/18/2008)  Labs Reviewed: K+: 3.9 (10/13/2008) Creat: : 1.05 (10/13/2008)   Chol: 124 (10/13/2008)   HDL: 56 (10/13/2008)   LDL: 45 (10/13/2008)   TG: 116 (10/13/2008)  Problem # 6:  ACUTE BRONCHITIS (ICD-466.0) Assessment: Comment Only levaquin prescribed and Rocephin administered  Complete Medication List: 1)  Metformin Hcl 1000 Mg Tabs (Metformin hcl) .... One tab by mouth two times a day 2)  Hydrochlorothiazide 25 Mg Tabs (Hydrochlorothiazide) .... One tab by mouth once daily 3)  Abilify 10 Mg Tabs (Aripiprazole) .... One tab by mouth qhs 4)  Wellbutrin Xl 300 Mg Xr24h-tab (Bupropion hcl) .... One tab by mouth every morning 5)  Labetalol Hcl 300 Mg Tabs (Labetalol hcl) .... Take 2 tabs by mouth two times a day 6)  Glipizide 5 Mg Xr24h-tab (Glipizide) .... One tab by mouth qd  Other Orders: TLB-H. Pylori Abs(Helicobacter Pylori) (86677-HELICO) T-Culture, Sputum & Gram Stain (87070/[id number])  Patient Instructions: 1)  F/U as before. 2)  you are being treated for sinusitis,and bronchitis plls submit a sputum for culture, and you will receive  Rocepin 500mg  IM. 3)  Congrats on weight loss pls keep it up. Prescriptions: LEVAQUIN 750 MG TABS (LEVOFLOXACIN) Take 1 tablet by mouth once a day  #5 x 0   Entered and Authorized by:   Syliva Overman MD   Signed by:   Syliva Overman MD on 11/29/2008   Method used:   Electronically to        [facility] (retail)       [address] [place], [place]  [address]       Ph: [phone]       Fax: [phone]   RxID:   [id number] FLUCONAZOLE 150 MG TABS (FLUCONAZOLE) Take 1 tablet by mouth once a day as needed  #2 x 0   Entered and Authorized by:   Syliva Overman MD   Signed by:   Syliva Overman MD on 11/29/2008   Method used:    Electronically to        [facility] (retail)       [address]       [place], [place]  [address]       Ph: [phone]       Fax: [phone]   RxID:   [id number]   Laboratory Results   Blood Tests   Date/Time Received: 11/29/08 2:15p Date/Time Reported: 11/29/08 2:15p  Glucose (random): 156 mg/dL   (Normal Range: 88-416)       Medication Administration  Injection # 1:    Medication: Rocephin  250mg     Diagnosis: SINUSITIS, ACUTE (ICD-461.9)    Route: IM    Site: LUOQ gluteus    Exp Date: 7/12    Lot #: [id number]    Mfr: [email or web address]    Comments: rocephin 500mg  given    Patient tolerated  injection without complications    Given by: Worthy Keeler LPN (November 29, 2008 3:09 PM)  Orders Added: 1)  Glucose, (CBG) [82962] 2)  Est. Patient Level IV [[address]] 3)  TLB-H. Pylori Abs(Helicobacter Pylori) [86677-HELICO] 4)  T-Culture, Sputum & Gram Stain [87070/[id number]] 5)  Rocephin  250mg  [J0696] 6)  Admin of Therapeutic Inj  intramuscular or subcutaneous [60454]

## 2010-09-04 NOTE — Letter (Signed)
Summary: Out of Work  All     ,     Phone:   Fax:     November 29, 2008   Employee:  DAIELLE MELCHER [employer]    To Whom It May Concern:   For Medical reasons, please excuse the above named employee from work for the following dates:  Start:   11/29/08    End:   12/03/08  If you need additional information, please feel free to contact our office.         Sincerely,    Worthy Keeler LPN

## 2010-09-05 ENCOUNTER — Ambulatory Visit (INDEPENDENT_AMBULATORY_CARE_PROVIDER_SITE_OTHER): Payer: Self-pay | Admitting: Family Medicine

## 2010-09-05 ENCOUNTER — Encounter: Payer: Self-pay | Admitting: Family Medicine

## 2010-09-05 DIAGNOSIS — J42 Unspecified chronic bronchitis: Secondary | ICD-10-CM

## 2010-09-05 DIAGNOSIS — J329 Chronic sinusitis, unspecified: Secondary | ICD-10-CM

## 2010-09-05 DIAGNOSIS — E119 Type 2 diabetes mellitus without complications: Secondary | ICD-10-CM

## 2010-09-06 DIAGNOSIS — J209 Acute bronchitis, unspecified: Secondary | ICD-10-CM | POA: Insufficient documentation

## 2010-09-10 NOTE — Assessment & Plan Note (Signed)
Summary: off   Vital Signs:  Patient profile:   35 year old female Menstrual status:  regular Height:      65.5 inches Weight:      225 pounds BMI:     37.01 O2 Sat:      98 % Temp:     99.3 degrees F oral Pulse rate:   93 / minute Pulse rhythm:   regular Resp:     16 per minute BP sitting:   120 / 84  (left arm)  Vitals Entered By: Everitt Amber LPN (September 05, 2010 8:31 AM)  Nutrition Counseling: Patient's BMI is greater than 25 and therefore counseled on weight management options. CC: Has had a fever since saturday, congestion with drainage down throat making it sore, headache   Primary Care Provider:  Lodema Hong, M.D.  CC:  Has had a fever since saturday, congestion with drainage down throat making it sore, and headache.  History of Present Illness: 1 week h/o fever to 99.9, generalised body aches, sinus pressure chest congestion, tender swollen glands, sputum is red. Prior to this she had been well.   Current Medications (verified): 1)  Metformin Hcl 1000 Mg  Tabs (Metformin Hcl) .... One Tab By Mouth Two Times A Day 2)  Wellbutrin Xl 300 Mg Xr24h-Tab (Bupropion Hcl) .... One Tab By Mouth Every Morning 3)  Labetalol Hcl 300 Mg Tabs (Labetalol Hcl) .... Take 2 Tabs By Mouth Two Times A Day 4)  Glipizide 5 Mg Xr24h-Tab (Glipizide) .... One Tab By Mouth Qd 5)  Omeprazole 40 Mg Cpdr (Omeprazole) .... Take 1 Capsule By Mouth Once A Day 6)  Reglan 5 Mg Tabs (Metoclopramide Hcl) .Marland Kitchen.. 1 By Mouth 30 Minute Prior To Breakfast and Lunch 7)  Inh .... Once Daily 8)  Hydrochlorothiazide 25 Mg Tabs (Hydrochlorothiazide) .... Once Daily 9)  Pyridoxine Hcl 25 Mg Tabs (Pyridoxine Hcl) .... One Tab By Mouth Once Daily 10)  Isoniazid 300 Mg Tabs (Isoniazid) .... One Tab By Mouth Once Daily 11)  Trazodone Hcl 50 Mg Tabs (Trazodone Hcl) .... One Tab By Mouth At Bedtime As Needed 12)  Loratadine 10 Mg Tabs (Loratadine) .... One Tab By Mouth Once Daily  Allergies (verified): 1)  !  Augmentin  Review of Systems      See HPI General:  Complains of chills, fatigue, fever, and malaise. Eyes:  Denies discharge and red eye. CV:  Denies chest pain or discomfort and palpitations. GI:  Denies abdominal pain, constipation, and diarrhea. GU:  Denies dysuria and urinary frequency. Endo:  Denies excessive hunger, excessive thirst, and excessive urination.  Physical Exam  General:  Well-developed,well-nourished,in no acute distress; alert,appropriate and cooperative throughout examination. ILll appearing HEENT: No facial asymmetry,  EOMI, maxillary sinus tenderness, TM's Clear, oropharynx  pink and moist.   Chest: dcreased air entry,bilateral crackles CVS: S1, S2, No murmurs, No S3.   Abd: Soft, Nontender.  MS: Adequate ROM spine, hips, shoulders and knees.  Ext: No edema.   CNS: CN 2-12 intact, power tone and sensation normal throughout.   Skin: Intact, no visible lesions or rashes.  Psych: Good eye contact, normal affect.  Memory intact, not anxious or depressed appearing.    Impression & Recommendations:  Problem # 1:  SINUSITIS (ICD-473.9) Assessment Comment Only  Her updated medication list for this problem includes:    Isoniazid 300 Mg Tabs (Isoniazid) ..... One tab by mouth once daily    Septra Ds 800-160 Mg Tabs (Sulfamethoxazole-trimethoprim) .Marland Kitchen... Take 1 tablet  by mouth two times a day    Tessalon Perles 100 Mg Caps (Benzonatate) .Marland Kitchen... Take 1 capsule by mouth three times a day  Problem # 2:  ACUTE BRONCHITIS (ICD-466.0) Assessment: Comment Only  Her updated medication list for this problem includes:    Isoniazid 300 Mg Tabs (Isoniazid) ..... One tab by mouth once daily    Septra Ds 800-160 Mg Tabs (Sulfamethoxazole-trimethoprim) .Marland Kitchen... Take 1 tablet by mouth two times a day    Tessalon Perles 100 Mg Caps (Benzonatate) .Marland Kitchen... Take 1 capsule by mouth three times a day  Problem # 3:  BIPOLAR DISORDER UNSPECIFIED (ICD-296.80) Assessment: Unchanged followed  by psychiatry  Problem # 4:  HYPERTENSION (ICD-401.9) Assessment: Unchanged  Her updated medication list for this problem includes:    Labetalol Hcl 300 Mg Tabs (Labetalol hcl) .Marland Kitchen... Take 2 tabs by mouth two times a day    Hydrochlorothiazide 25 Mg Tabs (Hydrochlorothiazide) ..... Once daily  BP today: 120/84 Prior BP: 120/70 (06/20/2010)  Labs Reviewed: K+: 4.2 (06/17/2010) Creat: : 1.02 (06/17/2010)   Chol: 152 (12/03/2009)   HDL: 66 (12/03/2009)   LDL: 74 (12/03/2009)   TG: 60 (12/03/2009)  Complete Medication List: 1)  Metformin Hcl 1000 Mg Tabs (Metformin hcl) .... One tab by mouth two times a day 2)  Wellbutrin Xl 300 Mg Xr24h-tab (Bupropion hcl) .... One tab by mouth every morning 3)  Labetalol Hcl 300 Mg Tabs (Labetalol hcl) .... Take 2 tabs by mouth two times a day 4)  Glipizide 5 Mg Xr24h-tab (Glipizide) .... One tab by mouth qd 5)  Omeprazole 40 Mg Cpdr (Omeprazole) .... Take 1 capsule by mouth once a day 6)  Reglan 5 Mg Tabs (Metoclopramide hcl) .Marland Kitchen.. 1 by mouth 30 minute prior to breakfast and lunch 7)  Inh  .... Once daily 8)  Hydrochlorothiazide 25 Mg Tabs (Hydrochlorothiazide) .... Once daily 9)  Pyridoxine Hcl 25 Mg Tabs (Pyridoxine hcl) .... One tab by mouth once daily 10)  Isoniazid 300 Mg Tabs (Isoniazid) .... One tab by mouth once daily 11)  Trazodone Hcl 50 Mg Tabs (Trazodone hcl) .... One tab by mouth at bedtime as needed 12)  Loratadine 10 Mg Tabs (Loratadine) .... One tab by mouth once daily 13)  Septra Ds 800-160 Mg Tabs (Sulfamethoxazole-trimethoprim) .... Take 1 tablet by mouth two times a day 14)  Tessalon Perles 100 Mg Caps (Benzonatate) .... Take 1 capsule by mouth three times a day 15)  Fluconazole 150 Mg Tabs (Fluconazole) .... Take 1 tablet by mouth once a day as needed for vaginal itch  Patient Instructions: 1)  f/u as before. 2)  You are being treated  for sinusitis and bronchitis. 3)  Take 650-1000mg  of Tylenol every 4-6 hours as needed for  relief of pain or comfort of fever AVOID taking more than 4000mg   in a 24 hour period (can cause liver damage in higher doses). 4)  Take 400-600mg  of Ibuprofen (Advil, Motrin) with food every 4-6 hours as needed for relief of pain or comfort of fever.Pls ensure you drink alot of fluids. Prescriptions: GLIPIZIDE 5 MG XR24H-TAB (GLIPIZIDE) one tab by mouth qd  #90 x 2   Entered by:   Everitt Amber LPN   Authorized by:   Syliva Overman MD   Signed by:   Everitt Amber LPN on 16/05/9603   Method used:   Electronically to        [facility] [address] [phone]* (retail)       [phone] [place]  [address] [place], [place]  [address]       Ph: [phone]       Fax: [phone]   RxID:   [id number] METFORMIN HCL 1000 MG  TABS (METFORMIN HCL) one tab by mouth two times a day  #180 x 2   Entered by:   Everitt Amber LPN   Authorized by:   Syliva Overman MD   Signed by:   Everitt Amber LPN on 51/88/4166   Method used:   Electronically to        [facility] [address] [phone]* (retail)       [address] [place], [place]  [address]       Ph: [phone]       Fax: [phone]   RxID:   [id number] FLUCONAZOLE 150 MG TABS (FLUCONAZOLE) Take 1 tablet by mouth once a day as needed for vaginal itch  #1 x 0   Entered by:   Everitt Amber LPN   Authorized by:   Syliva Overman MD   Signed by:   Everitt Amber LPN on 01/27/9484   Method used:   Electronically to        [facility] [address] [phone]* (retail)       [address] [place], [place]  [address]       Ph: [phone]       Fax: [phone]   RxID:   [id number] TESSALON PERLES 100 MG CAPS (BENZONATATE) Take 1 capsule by mouth three times a day  #30 x 0   Entered by:   Everitt Amber LPN   Authorized by:   Syliva Overman MD   Signed by:   Everitt Amber LPN on 35/36/1443   Method used:   Electronically to        [facility] [address] [phone]* (retail)       [address] [place], [place]  [address]       Ph: [phone]       Fax: [phone]   RxID:    [id number] SEPTRA DS 800-160 MG TABS (SULFAMETHOXAZOLE-TRIMETHOPRIM) Take 1 tablet by mouth two times a day  #20 x 0   Entered by:   Everitt Amber LPN   Authorized by:   Syliva Overman MD   Signed by:   Everitt Amber LPN on 02/40/9735   Method used:   Electronically to        [facility] [address] [phone]* (retail)       [address] [place], [place]  [address]       Ph: [phone]       Fax: [phone]   RxID:   [id number] FLUCONAZOLE 150 MG TABS (FLUCONAZOLE) Take 1 tablet by mouth once a day as needed for vaginal itch  #1 x 0   Entered and Authorized by:   Syliva Overman MD   Signed by:   Syliva Overman MD on 09/05/2010   Method used:   Electronically to        [facility] [address] [phone]* (retail)       [address] [place], [place]  [address]       Ph: [phone]       Fax: [phone]   RxID:   [id number] TESSALON PERLES 100 MG CAPS (BENZONATATE) Take 1 capsule by  mouth three times a day  #30 x 0   Entered and Authorized by:   Syliva Overman MD   Signed by:   Syliva Overman MD on 09/05/2010   Method used:   Electronically to        [facility] [address] [phone]* (retail)       [address] [place], [place]  [address]       Ph: [phone]       Fax: [phone]   RxID:   [id number] SEPTRA DS 800-160 MG TABS (SULFAMETHOXAZOLE-TRIMETHOPRIM) Take 1 tablet by mouth two times a day  #20 x 0   Entered and Authorized by:   Syliva Overman MD   Signed by:   Syliva Overman MD on 09/05/2010   Method used:   Electronically to        [facility] [address] [phone]* (retail)       [address] [place], [place]  [address]       Ph: [phone]       Fax: [phone]   RxID:   [id number]    Orders Added: 1)  Est. Patient Level III [[address]]

## 2010-09-29 ENCOUNTER — Encounter (HOSPITAL_COMMUNITY): Payer: Self-pay | Admitting: Psychiatry

## 2010-10-10 ENCOUNTER — Ambulatory Visit (INDEPENDENT_AMBULATORY_CARE_PROVIDER_SITE_OTHER): Payer: Self-pay | Admitting: Family Medicine

## 2010-10-10 ENCOUNTER — Encounter: Payer: Self-pay | Admitting: Family Medicine

## 2010-10-10 DIAGNOSIS — J329 Chronic sinusitis, unspecified: Secondary | ICD-10-CM

## 2010-10-10 DIAGNOSIS — F319 Bipolar disorder, unspecified: Secondary | ICD-10-CM

## 2010-10-10 DIAGNOSIS — E119 Type 2 diabetes mellitus without complications: Secondary | ICD-10-CM

## 2010-10-20 ENCOUNTER — Ambulatory Visit: Payer: Self-pay | Admitting: Family Medicine

## 2010-10-21 NOTE — Assessment & Plan Note (Signed)
Summary: office visit   Vital Signs:  Patient profile:   35 year old female Menstrual status:  regular Height:      65.5 inches Weight:      217 pounds BMI:     35.69 O2 Sat:      98 % Temp:     98.9 degrees F oral Pulse rate:   91 / minute Pulse rhythm:   regular Resp:     16 per minute BP sitting:   120 / 82  (left arm) Cuff size:   large  Vitals Entered By: Everitt Amber LPN (October 09, 1608 8:50 AM)  Nutrition Counseling: Patient's BMI is greater than 25 and therefore counseled on weight management options. CC: c/o nasal congestion, some in chest, sore throat, headache, denies chills or body aches   Primary Care Provider:  Lodema Hong, M.D.  CC:  c/o nasal congestion, some in chest, sore throat, headache, and denies chills or body aches.  History of Present Illness: 3 day h/o sinus congestion yellow nasal drainage and cough productive green sputum.she has also been experiencing intermittent chills and fever. she has cut back on the nmetformin to "save money", notes herfasting sugars are in the 140's and has lost weightt, most likely due to uncontrolled blood sugars. Her mental health is stable   Current Medications (verified): 1)  Metformin Hcl 1000 Mg  Tabs (Metformin Hcl) .... Take 1 Tablet By Mouth Once A Day 2)  Wellbutrin Xl 300 Mg Xr24h-Tab (Bupropion Hcl) .... One Tab By Mouth Every Morning 3)  Labetalol Hcl 300 Mg Tabs (Labetalol Hcl) .... Take 2 Tabs By Mouth Two Times A Day 4)  Glipizide 5 Mg Xr24h-Tab (Glipizide) .... One Tab By Mouth Qd 5)  Prilosec Otc 20 Mg Tbec (Omeprazole Magnesium) .... Take 1 Tablet By Mouth Once A Day 6)  Reglan 5 Mg Tabs (Metoclopramide Hcl) .Marland Kitchen.. 1 By Mouth 30 Minute Prior To Breakfast and Lunch 7)  Hydrochlorothiazide 25 Mg Tabs (Hydrochlorothiazide) .... Once Daily 8)  Pyridoxine Hcl 25 Mg Tabs (Pyridoxine Hcl) .... One Tab By Mouth Once Daily 9)  Isoniazid 300 Mg Tabs (Isoniazid) .... One Tab By Mouth Once Daily 10)  Trazodone Hcl 50 Mg  Tabs (Trazodone Hcl) .... One Tab By Mouth At Bedtime As Needed 11)  Loratadine 10 Mg Tabs (Loratadine) .... One Tab By Mouth Once Daily  Allergies (verified): 1)  ! Augmentin  Review of Systems      See HPI General:  Complains of chills, fatigue, fever, loss of appetite, and malaise. CV:  Denies chest pain or discomfort, difficulty breathing while lying down, palpitations, and swelling of hands. GI:  Denies abdominal pain, constipation, diarrhea, nausea, and vomiting. GU:  Denies dysuria and urinary frequency. Endo:  Denies excessive thirst and excessive urination; fasting sugars in the 140's. Heme:  Denies abnormal bruising and bleeding. Allergy:  Complains of seasonal allergies; denies hives or rash and itching eyes.  Physical Exam  General:  Well-developed,well-nourished,in no acute distress; alert,appropriate and cooperative throughout examination.  HEENT: No facial asymmetry,  EOMI, maxillary sinus tenderness, TM's Clear, oropharynx  pink and moist.   Chest: dcreased air entry,bilateral crackles CVS: S1, S2, No murmurs, No S3.   Abd: Soft, Nontender.  MS: Adequate ROM spine, hips, shoulders and knees.  Ext: No edema.   CNS: CN 2-12 intact, power tone and sensation normal throughout.   Skin: Intact, no visible lesions or rashes.  Psych: Good eye contact, normal affect.  Memory intact, not anxious  or depressed appearing.    Impression & Recommendations:  Problem # 1:  ACUTE BRONCHITIS (ICD-466.0) Assessment Comment Only  The following medications were removed from the medication list:    Septra Ds 800-160 Mg Tabs (Sulfamethoxazole-trimethoprim) .Marland Kitchen... Take 1 tablet by mouth two times a day    Tessalon Perles 100 Mg Caps (Benzonatate) .Marland Kitchen... Take 1 capsule by mouth three times a day Her updated medication list for this problem includes:    Isoniazid 300 Mg Tabs (Isoniazid) ..... One tab by mouth once daily    Septra Ds 800-160 Mg Tabs (Sulfamethoxazole-trimethoprim) .Marland Kitchen...  Take 1 tablet by mouth two times a day    Tessalon Perles 100 Mg Caps (Benzonatate) .Marland Kitchen... Take 1 capsule by mouth three times a day  Problem # 2:  SINUSITIS (ICD-473.9) Assessment: Comment Only  The following medications were removed from the medication list:    Septra Ds 800-160 Mg Tabs (Sulfamethoxazole-trimethoprim) .Marland Kitchen... Take 1 tablet by mouth two times a day    Tessalon Perles 100 Mg Caps (Benzonatate) .Marland Kitchen... Take 1 capsule by mouth three times a day Her updated medication list for this problem includes:    Isoniazid 300 Mg Tabs (Isoniazid) ..... One tab by mouth once daily    Septra Ds 800-160 Mg Tabs (Sulfamethoxazole-trimethoprim) .Marland Kitchen... Take 1 tablet by mouth two times a day    Tessalon Perles 100 Mg Caps (Benzonatate) .Marland Kitchen... Take 1 capsule by mouth three times a day  Problem # 3:  DIABETES MELLITUS, TYPE II (ICD-250.00) Assessment: Deteriorated  Her updated medication list for this problem includes:    Metformin Hcl 1000 Mg Tabs (Metformin hcl) .Marland Kitchen... Take 1 tablet by mouth once a day    Glipizide 5 Mg Xr24h-tab (Glipizide) ..... One tab by mouth qd  Problem # 4:  BIPOLAR DISORDER UNSPECIFIED (ICD-296.80) Assessment: Unchanged  Complete Medication List: 1)  Metformin Hcl 1000 Mg Tabs (Metformin hcl) .... Take 1 tablet by mouth once a day 2)  Wellbutrin Xl 300 Mg Xr24h-tab (Bupropion hcl) .... One tab by mouth every morning 3)  Labetalol Hcl 300 Mg Tabs (Labetalol hcl) .... Take 2 tabs by mouth two times a day 4)  Glipizide 5 Mg Xr24h-tab (Glipizide) .... One tab by mouth qd 5)  Prilosec Otc 20 Mg Tbec (Omeprazole magnesium) .... Take 1 tablet by mouth once a day 6)  Reglan 5 Mg Tabs (Metoclopramide hcl) .Marland Kitchen.. 1 by mouth 30 minute prior to breakfast and lunch 7)  Hydrochlorothiazide 25 Mg Tabs (Hydrochlorothiazide) .... Once daily 8)  Pyridoxine Hcl 25 Mg Tabs (Pyridoxine hcl) .... One tab by mouth once daily 9)  Isoniazid 300 Mg Tabs (Isoniazid) .... One tab by mouth once  daily 10)  Trazodone Hcl 50 Mg Tabs (Trazodone hcl) .... One tab by mouth at bedtime as needed 11)  Loratadine 10 Mg Tabs (Loratadine) .... One tab by mouth once daily 12)  Septra Ds 800-160 Mg Tabs (Sulfamethoxazole-trimethoprim) .... Take 1 tablet by mouth two times a day 13)  Tessalon Perles 100 Mg Caps (Benzonatate) .... Take 1 capsule by mouth three times a day  Patient Instructions: 1)  Cancel March 19 appt pls. 2)  Resched for 4 months. 3)  You are being treated for sinusitis and bronchitis, meds are sent to your pharmacy 4)  Pls take the metformin twice daily as prescribed Prescriptions: TESSALON PERLES 100 MG CAPS (BENZONATATE) Take 1 capsule by mouth three times a day  #30 x 0   Entered and Authorized by:  Syliva Overman MD   Signed by:   Syliva Overman MD on 10/10/2010   Method used:   Electronically to        [facility] [address] [phone]* (retail)       [address] [place], [place]  [address]       Ph: [phone]       Fax: [phone]   RxID:   [id number] SEPTRA DS 800-160 MG TABS (SULFAMETHOXAZOLE-TRIMETHOPRIM) Take 1 tablet by mouth two times a day  #20 x 0   Entered and Authorized by:   Syliva Overman MD   Signed by:   Syliva Overman MD on 10/10/2010   Method used:   Electronically to        [facility] [address] [phone]* (retail)       [address] [place], [place]  [address]       Ph: [phone]       Fax: [phone]   RxID:   [id number]    Orders Added: 1)  Est. Patient Level III [[address]]

## 2010-11-04 ENCOUNTER — Telehealth: Payer: Self-pay | Admitting: Family Medicine

## 2010-11-04 DIAGNOSIS — J329 Chronic sinusitis, unspecified: Secondary | ICD-10-CM

## 2010-11-04 MED ORDER — AZITHROMYCIN 250 MG PO TABS: ORAL_TABLET | ORAL | Status: DC

## 2010-11-04 NOTE — Telephone Encounter (Signed)
pls let her know she needs eval , since she has been recently been on so many abiotics I will not be prescribing any without this, she will need to go to urgent care, no appts available here

## 2010-11-05 LAB — GLUCOSE, CAPILLARY: Glucose-Capillary: 129 mg/dL — ABNORMAL HIGH (ref 70–99)

## 2010-11-17 NOTE — Progress Notes (Signed)
  Subjective:    Patient ID: Christina [name], female    DOB: 1976-07-28, 35 y.o.   MRN: [id number]  HPI    Review of Systems     Objective:   Physical Exam        Assessment & Plan:  No visit on this date in this system

## 2010-11-25 ENCOUNTER — Encounter: Payer: Self-pay | Admitting: Gastroenterology

## 2010-12-17 ENCOUNTER — Ambulatory Visit: Payer: Self-pay | Admitting: Gastroenterology

## 2010-12-19 NOTE — Discharge Summary (Signed)
NAMEMARITSSA, Christina [name] NO.:  [id number]   MEDICAL RECORD NO.:  [id number]          PATIENT TYPE:  INP   LOCATION:  9303                          FACILITY:  [facility]   PHYSICIAN:  Roseanna Rainbow, M.D.DATE OF BIRTH:  22-Jul-1976   DATE OF ADMISSION:  07/02/2006  DATE OF DISCHARGE:  07/11/2006                               DISCHARGE SUMMARY   CHIEF COMPLAINT:  The patient is a 35 year old gravida 1 with an  estimated date of confinement of August 13, 2006, who presented to the  office today and was found to have elevated blood pressures and  proteinuria on a urine DIP.   HISTORY OF PRESENT ILLNESS:  The patient has a history of chronic  hypertension and type 2 adult-onset diabetes mellitus.  She had been  managed on labetalol for the hypertension and metformin for glycemic  control.  Her blood pressures were well controlled until recently.  Her  blood pressures were significantly elevated on presentation to the  office today and there was proteinuria noted.   PAST SURGICAL HISTORY:  1. Breast reduction.  2. 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   PAST MEDICAL HISTORY:  Please see the above.   MEDICATIONS:  See the medication reconciliation list.   ALLERGIES:  PENICILLIN.   SOCIAL HISTORY:  She is married.  She denies any tobacco, ethanol or  drug use.   FAMILY HISTORY:  Diabetes, hypertension and asthma.   PHYSICAL EXAMINATION:  VITAL SIGNS:  Afebrile, blood pressure 160s over  70s to 90s.  LUNGS:  Clear to auscultation bilaterally.  HEART:  Regular rate and rhythm.  ABDOMEN:  Gravid.  PELVIC:  Cervical exam deferred.   LABORATORY:  Hemoglobin 10, platelets 269,000, SGOT and PT normal.  Creatinine 1.2.  Uric acid 4.9.  Urinalysis with greater than 3+ on  urine dip.   ASSESSMENT:  1. A 34-week intrauterine pregnancy with a history of chronic      hypertension now with superimposed pre-eclampsia  2. Type 2 diabetes mellitus.   PLAN:   Admission, monitor blood pressures closely, magnesium sulfate  seizure prophylaxis, betamethasone.   HOSPITAL COURSE:  The patient was admitted.  She received a course of  betamethasone.  Magnesium sulfate seizure prophylaxis was started.  She  was continued on the oral labetalol for blood pressure control.  On  December 1, the 24-hour urine results demonstrated 11 grams of protein.  Fetal testing was reassuring.  Her CBG became elevated secondary to the  steroid challenge.  Her insulin was adjusted.  On December 3rd, her  cervical exam was 1-cm dilated and thick.  The vertex was at a minus  three station and ripening was started with a prostaglandin vaginally.  Her blood pressures continued to remain labile and her antihypertensive  regimen was titrated.  Serial PIH labs were stable.  The membranes were  artificially ruptured early in labor and she was given with further  Pitocin.  During labor there was decreased long term variability  secondary to the labetalol.  However, there was a response to fetal  scalp stimulation.  On December 5 at 4:45, she was delivered of viable  female.  The Apgar's were 3 and 9 at one and five minutes, respectively.  Weight was 4 pounds 11 ounces.  She had a secondary perineal laceration  that was repaired.  She was then transferred to the ICU.  Again, her  blood pressures were labile and her antihypertensive regimen was again  adjusted.  Clonidine orally was started.  Her metformin was resumed.  The magnesium sulfate was discontinued on postpartum day #3, and she was  discharged to home on postpartum day #4.   DISCHARGE DIAGNOSIS:  Intrauterine pregnancy at 34+ weeks with a history  of chronic hypertension superimposed severe pre-eclampsia, adult-onset  diabetes mellitus.   PROCEDURE:  Induction of labor and vaginal delivery.   CONDITION:  Stable.   DIET:  A 2,000 kcal ADA diet, low-salt, low-fat.   MEDICATIONS:  Included Percocet, ibuprofen,  Micronor, Valtrex,  clonidine, labetalol, metformin, prenatal vitamins, and ferrous sulfate.   DISPOSITION:  The patient was to follow up in the office in 2 days.      Roseanna Rainbow, M.D.  Electronically Signed     LAJ/MEDQ  D:  07/31/2006  T:  07/31/2006  Job:  [id number]

## 2011-01-23 ENCOUNTER — Encounter (HOSPITAL_COMMUNITY): Payer: Self-pay | Admitting: Psychiatry

## 2011-01-23 DIAGNOSIS — F39 Unspecified mood [affective] disorder: Secondary | ICD-10-CM

## 2011-02-05 ENCOUNTER — Telehealth: Payer: Self-pay | Admitting: *Deleted

## 2011-02-05 DIAGNOSIS — E119 Type 2 diabetes mellitus without complications: Secondary | ICD-10-CM

## 2011-02-05 DIAGNOSIS — Z13220 Encounter for screening for lipoid disorders: Secondary | ICD-10-CM

## 2011-02-05 DIAGNOSIS — D649 Anemia, unspecified: Secondary | ICD-10-CM

## 2011-02-05 DIAGNOSIS — I10 Essential (primary) hypertension: Secondary | ICD-10-CM

## 2011-02-05 NOTE — Telephone Encounter (Signed)
ORDERS ONLY

## 2011-02-05 NOTE — Telephone Encounter (Signed)
Orders only

## 2011-02-06 LAB — HEMOGLOBIN A1C
Hgb A1c MFr Bld: 6.9 % — ABNORMAL HIGH (ref <5.7)
Mean Plasma Glucose: 151 mg/dL — ABNORMAL HIGH (ref <117)

## 2011-02-06 LAB — COMPLETE METABOLIC PANEL WITH GFR
ALT: 12 U/L (ref 0–35)
AST: 15 U/L (ref 0–37)
Albumin: 4.3 g/dL (ref 3.5–5.2)
Alkaline Phosphatase: 57 U/L (ref 39–117)
BUN: 9 mg/dL (ref 6–23)
CO2: 28 mEq/L (ref 19–32)
Calcium: 9.4 mg/dL (ref 8.4–10.5)
Chloride: 103 mEq/L (ref 96–112)
Creat: 0.81 mg/dL (ref 0.50–1.10)
GFR, Est African American: >60 mL/min (ref >60)
GFR, Est Non African American: >60 mL/min (ref >60)
Glucose, Bld: 98 mg/dL (ref 70–99)
Potassium: 4 mEq/L (ref 3.5–5.3)
Sodium: 139 mEq/L (ref 135–145)
Total Bilirubin: 0.2 mg/dL — ABNORMAL LOW (ref 0.3–1.2)
Total Protein: 7 g/dL (ref 6.0–8.3)

## 2011-02-06 LAB — CBC WITH DIFFERENTIAL/PLATELET
Basophils Absolute: 0 10*3/uL (ref 0.0–0.1)
Basophils Relative: 1 % (ref 0–1)
Eosinophils Absolute: 0.1 10*3/uL (ref 0.0–0.7)
Eosinophils Relative: 2 % (ref 0–5)
HCT: 35.7 % — ABNORMAL LOW (ref 36.0–46.0)
Hemoglobin: 11.6 g/dL — ABNORMAL LOW (ref 12.0–15.0)
Lymphocytes Relative: 53 % — ABNORMAL HIGH (ref 12–46)
Lymphs Abs: 2.3 10*3/uL (ref 0.7–4.0)
MCH: 30.2 pg (ref 26.0–34.0)
MCHC: 32.5 g/dL (ref 30.0–36.0)
MCV: 93 fL (ref 78.0–100.0)
Monocytes Absolute: 0.3 10*3/uL (ref 0.1–1.0)
Monocytes Relative: 8 % (ref 3–12)
Neutro Abs: 1.7 10*3/uL (ref 1.7–7.7)
Neutrophils Relative %: 37 % — ABNORMAL LOW (ref 43–77)
Platelets: 347 10*3/uL (ref 150–400)
RBC: 3.84 MIL/uL — ABNORMAL LOW (ref 3.87–5.11)
RDW: 13.5 % (ref 11.5–15.5)
WBC: 4.4 10*3/uL (ref 4.0–10.5)

## 2011-02-06 LAB — LIPID PANEL
Cholesterol: 157 mg/dL (ref 0–200)
HDL: 59 mg/dL (ref >39)
LDL Cholesterol: 80 mg/dL (ref 0–99)
Total CHOL/HDL Ratio: 2.7 Ratio
Triglycerides: 90 mg/dL (ref <150)
VLDL: 18 mg/dL (ref 0–40)

## 2011-02-16 ENCOUNTER — Encounter: Payer: Self-pay | Admitting: Family Medicine

## 2011-02-23 ENCOUNTER — Ambulatory Visit (INDEPENDENT_AMBULATORY_CARE_PROVIDER_SITE_OTHER): Payer: PRIVATE HEALTH INSURANCE | Admitting: Family Medicine

## 2011-02-23 ENCOUNTER — Encounter: Payer: Self-pay | Admitting: Family Medicine

## 2011-02-23 VITALS — BP 118/82 | HR 85 | Resp 16 | Ht 65.5 in | Wt 219.4 lb

## 2011-02-23 DIAGNOSIS — E669 Obesity, unspecified: Secondary | ICD-10-CM

## 2011-02-23 DIAGNOSIS — I10 Essential (primary) hypertension: Secondary | ICD-10-CM

## 2011-02-23 DIAGNOSIS — F319 Bipolar disorder, unspecified: Secondary | ICD-10-CM

## 2011-02-23 DIAGNOSIS — Z23 Encounter for immunization: Secondary | ICD-10-CM

## 2011-02-23 DIAGNOSIS — K219 Gastro-esophageal reflux disease without esophagitis: Secondary | ICD-10-CM

## 2011-02-23 DIAGNOSIS — E119 Type 2 diabetes mellitus without complications: Secondary | ICD-10-CM

## 2011-02-23 NOTE — Patient Instructions (Addendum)
F/U in 3 months,  TdaP today and pneumovac  Pls call and register for grp sessions on diabetes   HBA1C and chem 7 in 3 months before visit  A healthy diet is rich in fruit, vegetables and whole grains. Poultry fish, nuts and beans are a healthy choice for protein rather then red meat. A low sodium diet and drinking 64 ounces of water daily is generally recommended. Oils and sweet should be limited. Carbohydrates especially for those who are diabetic or overweight, should be limited to 34-45 gram per meal. It is important to eat on a regular schedule, at least 3 times daily. Snacks should be primarily fruits, vegetables or nuts.  It is important that you exercise regularly at least 30 minutes 5 times a week. If you develop chest pain, have severe difficulty breathing, or feel very tired, stop exercising immediately and seek medical attention  pls take med for diabetes as prescribed  pls sched eye exam this year  Wt loss goal of 10 pounds

## 2011-02-23 NOTE — Assessment & Plan Note (Signed)
Controlled and stable at this time. Pt to continue with mental health

## 2011-02-23 NOTE — Assessment & Plan Note (Addendum)
Less well controlled, lifestyle modification advised,  No medication change at this time

## 2011-02-25 ENCOUNTER — Encounter: Payer: Self-pay | Admitting: Gastroenterology

## 2011-02-25 ENCOUNTER — Ambulatory Visit (INDEPENDENT_AMBULATORY_CARE_PROVIDER_SITE_OTHER): Payer: PRIVATE HEALTH INSURANCE | Admitting: Gastroenterology

## 2011-02-25 DIAGNOSIS — K219 Gastro-esophageal reflux disease without esophagitis: Secondary | ICD-10-CM

## 2011-02-25 DIAGNOSIS — K3184 Gastroparesis: Secondary | ICD-10-CM

## 2011-02-25 MED ORDER — OMEPRAZOLE MAGNESIUM 20 MG PO TBEC: DELAYED_RELEASE_TABLET | ORAL | Status: DC

## 2011-02-25 MED ORDER — METOCLOPRAMIDE HCL 5 MG PO TABS: ORAL_TABLET | ORAL | Status: DC

## 2011-02-25 NOTE — Patient Instructions (Signed)
INCREASE REGLAN TO THREE TIMES A DAY. CONTINUE PRILOSEC 30 MINUTES PRIOR TO BREAKFAST, FOLLOW UP IN 3 MOS. FOLLOW GERD REC'S BELOW. LOSE 5 LBS BEFORE NEXT VISIT.   [email or web address]  RISK FACTORS FOR GERD   GASTROPARESIS   Obesity: your body mass index is 35 consistent with obesity.   CARBONATED BEVERAGES   CAFFEINE   HIATAL HERNIA    Lifestyle and home remedies You may eliminate or reduce the frequency of heartburn by making the following lifestyle changes:    Control your weight. Being overweight is a major risk factor for heartburn and GERD. Excess pounds put pressure on your abdomen, pushing up your stomach and causing acid to back up into your esophagus.    Eat 4-6 small meals daily. This reduces pressure on the lower esophageal sphincter, helping to prevent the valve from opening and acid from washing back into your esophagus.    Loosen your belt. Clothes that fit tightly around your waist put pressure on your abdomen and the lower esophageal sphincter.     Eliminate heartburn triggers. Everyone has specific triggers. Common triggers such as fatty or fried foods, spicy food, tomato sauce, carbonated beverages, alcohol, chocolate, mint, garlic, onion, caffeine and nicotine may make heartburn worse.    Avoid stooping or bending. Tying your shoes is OK. Bending over for longer periods to weed your garden isn't, especially soon after eating.    Don't lie down after a meal. Wait at least three to four hours after eating before going to bed, and don't lie down right after eating.    Raise the head of your bed. An elevation of about six to nine inches puts gravity to work for you. You can do this by placing wooden or cement blocks under the feet of your bed at the head end. If it's not possible to elevate your bed, you can insert a wedge between your mattress and box spring to elevate your body from the waist up. Wedges are available at drugstores and medical supply stores. Raising your  head only by using pillows is not a good alternative.   Alternative medicine   Several home remedies exist for treating GERD, but they provide only temporary relief. They include drinking baking soda (sodium bicarbonate) added to water or drinking other fluids such as baking soda mixed with cream of tartar and water.   Although these liquids create temporary relief by neutralizing, washing away or buffering acids, eventually they aggravate the situation by adding gas and fluid to your stomach, increasing pressure and causing more acid reflux. Further, adding more sodium to your diet may increase your blood pressure and add stress to your heart, and excessive bicarbonate ingestion can alter the acid-base balance in your body.

## 2011-02-25 NOTE — Progress Notes (Signed)
Reminder in epic to follow up in 3 months °

## 2011-02-25 NOTE — Progress Notes (Signed)
  Subjective:    Patient ID: Christina [name], female    DOB: [birth year], 35 y.o.   MRN: [id number]  PCP: [facility]  HPI Vomiting: rare, last time 1 week ago and a little today. Went to the dietitian yesterday. Nausea: DAILY. Taking Reglan once daily.  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 Bms: 1 every 0-1 day, & loose.   Past Medical History  Diagnosis Date  . Hypertension   . Diabetes mellitus   . GERD (gastroesophageal reflux disease)   . Bipolar disorder     Allergies  Allergen Reactions  . WUJ:[id number]+AOZHYQMVH+QIONGEXBMW Acid+Aspartame     REACTION: stomach cramps   Current Outpatient Prescriptions  Medication Sig Dispense Refill  . ARIPiprazole (ABILIFY) 10 MG tablet Take 10 mg by mouth daily.        Marland Kitchen buPROPion (WELLBUTRIN XL) 300 MG 24 hr tablet Take 300 mg by mouth every morning.        Marland Kitchen glipiZIDE (GLUCOTROL XL) 5 MG 24 hr tablet Take 5 mg by mouth daily.        . hydrochlorothiazide 25 MG tablet Take 25 mg by mouth daily.        Marland Kitchen labetalol (NORMODYNE) 300 MG tablet Take 600 mg by mouth 2 (two) times daily.        Marland Kitchen loratadine (CLARITIN) 10 MG tablet Take 10 mg by mouth daily.        . metFORMIN (GLUCOPHAGE) 1000 MG tablet Take 1,000 mg by mouth daily with breakfast.        . metoCLOPramide (REGLAN) 5 MG tablet One tab by mouth 30 mins prior to breakfast, AND lunch    . omeprazole (PRILOSEC OTC) 20 MG tablet 1 po 30 minutes prior to breakfast         Review of Systems     Objective:   Physical Exam  Constitutional: She is oriented to person, place, and time. She appears well-developed and well-nourished. No distress.  HENT:  Head: Normocephalic and atraumatic.  Neck: Normal range of motion. Neck supple.  Cardiovascular: Normal rate, regular rhythm and normal heart sounds.   Pulmonary/Chest: Effort normal and breath sounds normal.  Abdominal: Soft. Bowel sounds are normal. She exhibits no distension. There is no tenderness.  Musculoskeletal: She exhibits no  edema.  Neurological: She is alert and oriented to person, place, and time.  Psychiatric: Thought content normal.          Assessment & Plan:

## 2011-02-26 ENCOUNTER — Telehealth: Payer: Self-pay | Admitting: Family Medicine

## 2011-02-26 NOTE — Assessment & Plan Note (Signed)
Sx fairly well controlled.  CONTINUE PRILOSEC 30 MINUTES PRIOR BREAKFAST, FOLLOW UP IN 3 MOS. FOLLOW GERD REC'S. Ho given. LOSE 5 LBS BEFORE NEXT VISIT.

## 2011-02-26 NOTE — Assessment & Plan Note (Addendum)
Sx no ideally CONTROLLED. Pt has daily nausea. Pt likely not adhering to the gastroparesis diet. Her BMI remains > 35.  INCREASE REGLAN TO THREE TIMES A DAY. FOLLOW GASTROPARESIS DIET.

## 2011-02-26 NOTE — Telephone Encounter (Signed)
Called patient, left message.

## 2011-02-26 NOTE — Progress Notes (Signed)
Cc to PCP 

## 2011-02-27 NOTE — Telephone Encounter (Signed)
Patient aware.

## 2011-02-27 NOTE — Telephone Encounter (Signed)
Patient aware meters available here no charge

## 2011-03-01 NOTE — Progress Notes (Signed)
  Subjective:    Patient ID: Christina [name], female    DOB: 1975/12/04, 35 y.o.   MRN: [id number]  HPI The PT is here for follow up and re-evaluation of chronic medical conditions, medication management and review of any available recent lab and radiology data.  Preventive health is updated, specifically  Cancer screening and Immunization.   Questions or concerns regarding consultations or procedures which the PT has had in the interim are  addressed. The PT denies any adverse reactions to current medications since the last visit.  There are no new concerns. She is aware of little attention to healthy lifestyle, has been stressed, job hunting, she has gained weight. There are no specific complaints       Review of Systems Denies recent fever or chills. Denies sinus pressure, nasal congestion, ear pain or sore throat. Denies chest congestion, productive cough or wheezing. Denies chest pains, palpitations and leg swelling Denies abdominal pain, nausea, vomiting,diarrhea or constipation.   Denies dysuria, frequency, hesitancy or incontinence. Denies joint pain, swelling and limitation in mobility. Denies headaches, seizures, numbness, or tingling. Denies depression, anxiety or insomnia. Denies skin break down or rash.        Objective:   Physical Exam Patient alert and oriented and in no cardiopulmonary distress.  HEENT: No facial asymmetry, EOMI, no sinus tenderness,  oropharynx pink and moist.  Neck supple no adenopathy.  Chest: Clear to auscultation bilaterally.  CVS: S1, S2 no murmurs, no S3.  ABD: Soft non tender. Bowel sounds normal.  Ext: No edema  MS: Adequate ROM spine, shoulders, hips and knees.  Skin: Intact, no ulcerations or rash noted.  Psych: Good eye contact, normal affect. Memory intact not anxious or depressed appearing.  CNS: CN 2-12 intact, power, tone and sensation normal throughout. Diabetic Foot Check:  Appearance - no lesions, ulcers or  calluses Skin - no unusual pallor or redness Sensation - grossly intact to light touch Monofilament testing -  Right - Great toe, medial, central, lateral ball and posterior foot intact Left - Great toe, medial, central, lateral ball and posterior foot intact Pulses Left - Dorsalis Pedis and Posterior Tibia normal Right - Dorsalis Pedis and Posterior Tibia normal        Assessment & Plan:   BIPOLAR DISORDER UNSPECIFIED Controlled and stable at this time. Pt to continue with mental health  DIABETES MELLITUS, TYPE II Less well controlled, lifestyle modification advised  OBESITY Deteriorated. Patient re-educated about  the importance of commitment to a  minimum of 150 minutes of exercise per week. The importance of healthy food choices with portion control discussed. Encouraged to start a food diary, count calories and to consider  joining a support group. Sample diet sheets offered. Goals set by the patient for the next several months.     HYPERTENSION Controlled, no change in medication   GERD Improved, has seen GI also

## 2011-03-01 NOTE — Assessment & Plan Note (Signed)
Controlled, no change in medication  

## 2011-03-01 NOTE — Assessment & Plan Note (Signed)
Improved, has seen GI also

## 2011-03-01 NOTE — Assessment & Plan Note (Signed)
Deteriorated. Patient re-educated about  the importance of commitment to a  minimum of 150 minutes of exercise per week. The importance of healthy food choices with portion control discussed. Encouraged to start a food diary, count calories and to consider  joining a support group. Sample diet sheets offered. Goals set by the patient for the next several months.    

## 2011-03-02 ENCOUNTER — Other Ambulatory Visit: Payer: Self-pay | Admitting: *Deleted

## 2011-03-02 DIAGNOSIS — E119 Type 2 diabetes mellitus without complications: Secondary | ICD-10-CM

## 2011-03-02 MED ORDER — GLUCOSE BLOOD VI STRP: ORAL_STRIP | Status: AC

## 2011-03-02 MED ORDER — FREESTYLE LANCETS MISC: Status: AC

## 2011-03-20 ENCOUNTER — Encounter (HOSPITAL_COMMUNITY): Payer: Self-pay | Admitting: Psychiatry

## 2011-04-28 ENCOUNTER — Encounter: Payer: Self-pay | Admitting: Gastroenterology

## 2011-05-13 ENCOUNTER — Telehealth: Payer: Self-pay | Admitting: Family Medicine

## 2011-05-13 NOTE — Telephone Encounter (Signed)
Great news.

## 2011-05-26 ENCOUNTER — Ambulatory Visit: Payer: PRIVATE HEALTH INSURANCE | Admitting: Family Medicine

## 2011-07-06 ENCOUNTER — Ambulatory Visit (INDEPENDENT_AMBULATORY_CARE_PROVIDER_SITE_OTHER): Payer: PRIVATE HEALTH INSURANCE | Admitting: Psychiatry

## 2011-07-06 ENCOUNTER — Ambulatory Visit (HOSPITAL_COMMUNITY): Payer: PRIVATE HEALTH INSURANCE | Admitting: Psychiatry

## 2011-07-06 ENCOUNTER — Encounter (HOSPITAL_COMMUNITY): Payer: Self-pay | Admitting: Psychiatry

## 2011-07-06 VITALS — Wt 209.0 lb

## 2011-07-06 DIAGNOSIS — F3189 Other bipolar disorder: Secondary | ICD-10-CM

## 2011-07-06 MED ORDER — BUPROPION HCL ER (XL) 300 MG PO TB24: 300.0000 mg | ORAL_TABLET | ORAL | Status: DC

## 2011-07-06 NOTE — Progress Notes (Signed)
Patient came for her followup appointment. She has been moved to [place] due to her husband job. She is disappointed due to loss of her job which she had for 3 weeks. She admitted work was stressful and she could not able to provide a better job. 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 She is now actively looking for another job. She has a good Thanksgiving she came to [place] to visit her family. She is compliant with her medication and reported no side effects. She is sleeping better. She denies any crying spells agitation anger or mood swings. Her hemoglobin A1c is 6.1. She has lost 10 pounds from her last visit. Overall she has been stable on her current medication and does not want to change dosage. She is getting Abilify from [facility].  Mental status examination Patient is scheduled dressed and fairly groomed. Her speech is soft clear and coherent. She denies any active or passive suicidal thinking or homicidal thinking. There are no psychotic symptoms present. She denies any paranoid thinking. She described her mood is anxious and her affect is constricted. Her thought process is logical linear and goal-directed. She's alert and oriented x3. She denies any auditory or visual hallucination. Her insight judgment and impulse control is okay.  Assessment Bipolar disorder NOS  Plan I will continue Wellbutrin 300 mg daily and Abilify 10 mg daily. I have explained risks and benefits of medication in detail. I will see her again in 2 months. She liked to see this Clinical research associate and does not want to see psychiatrist in [place]. I will see her again in 2 months

## 2011-08-10 ENCOUNTER — Ambulatory Visit: Payer: PRIVATE HEALTH INSURANCE | Admitting: Family Medicine

## 2011-08-12 ENCOUNTER — Ambulatory Visit (HOSPITAL_COMMUNITY): Payer: PRIVATE HEALTH INSURANCE | Admitting: Psychiatry

## 2011-09-07 ENCOUNTER — Ambulatory Visit (HOSPITAL_COMMUNITY): Payer: PRIVATE HEALTH INSURANCE | Admitting: Psychiatry

## 2011-09-23 ENCOUNTER — Encounter (HOSPITAL_COMMUNITY): Payer: Self-pay | Admitting: Psychiatry

## 2011-09-23 ENCOUNTER — Ambulatory Visit (INDEPENDENT_AMBULATORY_CARE_PROVIDER_SITE_OTHER): Payer: PRIVATE HEALTH INSURANCE | Admitting: Psychiatry

## 2011-09-23 VITALS — BP 107/85 | HR 115 | Wt 209.5 lb

## 2011-09-23 DIAGNOSIS — F319 Bipolar disorder, unspecified: Secondary | ICD-10-CM

## 2011-09-23 MED ORDER — BUPROPION HCL ER (XL) 300 MG PO TB24: 300.0000 mg | ORAL_TABLET | ORAL | Status: DC

## 2011-09-23 MED ORDER — ARIPIPRAZOLE 10 MG PO TABS: 10.0000 mg | ORAL_TABLET | Freq: Every day | ORAL | Status: DC

## 2011-09-23 NOTE — Progress Notes (Signed)
Chief complaint I need my medication  History of present illness Patient is 36 year old [place] American female who came for her followup appointment. She has been moved to [place] due to her husband job. She is trying to get appointment a psychiatrist there however so far her appointment is not scheduled. She continues to struggle for a job however she has not give up and applying on a regular basis. She likes her current medication and reported no side effects. She denies any agitation, crying spells anger or mood swings. She admitted sometimes she has difficulty sleeping and takes trazodone which was given longtime ago. She denies any paranoid thinking or any hallucination. She denies any tremors or shakes with medication. Her weight remains unchanged from the past.   Mental status examination Patient is casually dressed and fairly groomed. Her speech is clear and coherent. She is calm cooperative and pleasant. She denies any active or passive suicidal thinking and homicidal thinking. There were no psychotic symptoms present. She denies any auditory or visual hallucination. She described her mood is anxious and her affect is mood congruent. She's alert and oriented x3. Her insight judgment and impulse control is okay.  Assessment Axis I Bipolar disorder NOS Axis II deferred Axis III hypertension, diabetes mellitus, GERD Axis IV mild to moderate  Plan I will continue Wellbutrin 300 mg daily and Abilify 10 mg daily. I have explained risks and benefits of medication in detail. I encourage her to do regular exercise and healthy diet. At this time patient has no concern or side effects of medication. She will continue to use trazodone only as needed for insomnia. I will see her again in 3 months.

## 2011-11-16 ENCOUNTER — Other Ambulatory Visit (HOSPITAL_COMMUNITY): Payer: Self-pay | Admitting: Psychiatry

## 2011-11-16 DIAGNOSIS — F319 Bipolar disorder, unspecified: Secondary | ICD-10-CM

## 2011-11-16 MED ORDER — ARIPIPRAZOLE 10 MG PO TABS: 10.0000 mg | ORAL_TABLET | Freq: Every day | ORAL | Status: DC

## 2011-11-16 NOTE — Progress Notes (Signed)
Patient called and requested  30 days of Abilify as she cannot afford ninety-day supply.  Pharmacy called 30 days prescription of Abilify with one more additional refill.

## 2011-12-21 ENCOUNTER — Ambulatory Visit (HOSPITAL_COMMUNITY): Payer: PRIVATE HEALTH INSURANCE | Admitting: Psychiatry

## 2012-01-15 ENCOUNTER — Other Ambulatory Visit (HOSPITAL_COMMUNITY): Payer: Self-pay | Admitting: Psychology

## 2012-01-15 DIAGNOSIS — F319 Bipolar disorder, unspecified: Secondary | ICD-10-CM

## 2012-01-15 MED ORDER — BUPROPION HCL ER (XL) 300 MG PO TB24: 300.0000 mg | ORAL_TABLET | ORAL | Status: DC

## 2012-01-25 ENCOUNTER — Ambulatory Visit (INDEPENDENT_AMBULATORY_CARE_PROVIDER_SITE_OTHER): Payer: PRIVATE HEALTH INSURANCE | Admitting: Psychiatry

## 2012-01-25 ENCOUNTER — Encounter (HOSPITAL_COMMUNITY): Payer: Self-pay | Admitting: Psychiatry

## 2012-01-25 VITALS — BP 113/90 | HR 109 | Wt 212.8 lb

## 2012-01-25 DIAGNOSIS — F319 Bipolar disorder, unspecified: Secondary | ICD-10-CM

## 2012-01-25 MED ORDER — ARIPIPRAZOLE 15 MG PO TABS: 15.0000 mg | ORAL_TABLET | Freq: Every day | ORAL | Status: DC

## 2012-01-25 NOTE — Progress Notes (Signed)
Chief complaint Medication followup .    History of present illness Patient is 36 year old [place] American female who came for her followup appointment.  She still unable to find a psychiatrist and he cream [place].  She admitted increased stress in her life due to insurance and not able to find a job.  She has 3 interview however she was not selected and feel disappointed.  She noticed increased depression and social isolation but denies any hallucination or paranoid thinking.  She is even thinking about applying for disability .  She admitted feeling of sadness and anhedonia.  However she denies any agitation anger or mood swing.  She also denies any active or passive suicidal thoughts.  Current psychiatric medication Abilify 10 mg daily Wellbutrin XL 300 mg daily   Past psychiatric history She has been seeing in this office in 2006.  She was referred from her primary care physician Dr. Lodema Hong.  She has along history of depression and started in 1997 at that time patient has suicidal gesture  And e to hit her leg with a piece of fruit however at that time she did not receive any treatment.  In the past she has treated with Prozac however her paranoia and hallucination did not improve.  Family history Patient endorse her father has some history of psychiatric illness however she did not provide much detail.  Psychosocial history Patient was born and raised in [place].  Recently she moved to [facility].  She has supportive husband.  She has no children.  Medical history Patient has history of diabetes , hypertension, gastroparesis, goiter and GERD.  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  Mental status examination Patient is casually dressed and fairly groomed. she appeared tense and anxious however she denies any active or passive suicidal thoughts or homicidal thoughts.  She endorse her mood  is depressed and sad her affect is constricted.  There were no tremors or shakes present .  Her attention and concentration is fair .  She's alert and oriented x3.  There were no delusion or psychotic symptoms present at this time.  Her insight judgment and pulse control is okay.  Assessment Axis I Bipolar disorder NOS Axis II deferred Axis III hypertension, diabetes mellitus, GERD Axis IV mild to moderate  Plan I  review her vitals, psychosocial stress, current psychiatric medication response to the medication.  I recommend to try Abilify 15 mg daily.  I will continue her Wellbutrin XL 300 mg daily.  I recommend to call us if she is any question or concern about the medication or if she feel worsening of the symptoms.  Time spent 30 minutes.  I will see her again in 2 months.  Portion of this note is generated with voice recognition software and may contain typographical error.

## 2012-03-30 ENCOUNTER — Ambulatory Visit (HOSPITAL_COMMUNITY): Payer: PRIVATE HEALTH INSURANCE | Admitting: Psychiatry

## 2012-04-18 ENCOUNTER — Other Ambulatory Visit (HOSPITAL_COMMUNITY): Payer: Self-pay | Admitting: *Deleted

## 2012-04-18 DIAGNOSIS — F319 Bipolar disorder, unspecified: Secondary | ICD-10-CM

## 2012-04-18 MED ORDER — BUPROPION HCL ER (XL) 300 MG PO TB24: 300.0000 mg | ORAL_TABLET | ORAL | Status: DC

## 2012-05-19 ENCOUNTER — Other Ambulatory Visit (HOSPITAL_COMMUNITY): Payer: Self-pay | Admitting: *Deleted

## 2012-05-19 DIAGNOSIS — F319 Bipolar disorder, unspecified: Secondary | ICD-10-CM

## 2012-05-19 NOTE — Telephone Encounter (Signed)
Received faxed refill request for Bupropion XL 300 mg daily Sent to MD for authorization

## 2012-08-03 NOTE — L&D Delivery Note (Signed)
Delivery Note At 10:47 AM a viable female was delivered via Vaginal, Spontaneous Delivery (Presentation: OA ).  APGAR: 8, 9 .   Placenta status: Intact, Spontaneous.  Cord: 3 vessels with the following complications: None.   Anesthesia: Epidural  Episiotomy: None Lacerations: None Suture Repair: n/a Est. Blood Loss (mL): 100 ml  Mom to postpartum.  Baby to Couplet care / Skin to Skin.  JACKSON-MOORE,Kerria Sapien A 06/15/2013, 11:25 AM

## 2012-10-25 LAB — OB RESULTS CONSOLE RUBELLA ANTIBODY, IGM: Rubella: NON-IMMUNE/NOT IMMUNE

## 2012-10-25 LAB — OB RESULTS CONSOLE GC/CHLAMYDIA
Chlamydia: NEGATIVE
Gonorrhea: NEGATIVE

## 2012-10-25 LAB — OB RESULTS CONSOLE ABO/RH: RH Type: POSITIVE

## 2012-10-25 LAB — OB RESULTS CONSOLE HEPATITIS B SURFACE ANTIGEN: Hepatitis B Surface Ag: NEGATIVE

## 2012-10-25 LAB — OB RESULTS CONSOLE ANTIBODY SCREEN: Antibody Screen: NEGATIVE

## 2012-10-25 LAB — OB RESULTS CONSOLE HGB/HCT, BLOOD
HCT: 34 %
Hemoglobin: 11.2 g/dL

## 2012-10-25 LAB — OB RESULTS CONSOLE RPR: RPR: NONREACTIVE

## 2012-10-25 LAB — OB RESULTS CONSOLE HIV ANTIBODY (ROUTINE TESTING): HIV: NONREACTIVE

## 2013-02-08 ENCOUNTER — Other Ambulatory Visit: Payer: Self-pay | Admitting: *Deleted

## 2013-02-08 ENCOUNTER — Encounter: Payer: Self-pay | Admitting: Obstetrics & Gynecology

## 2013-02-08 ENCOUNTER — Ambulatory Visit (INDEPENDENT_AMBULATORY_CARE_PROVIDER_SITE_OTHER): Payer: BC Managed Care – PPO | Admitting: Obstetrics & Gynecology

## 2013-02-08 VITALS — BP 138/83 | Temp 98.4°F | Wt 232.0 lb

## 2013-02-08 DIAGNOSIS — IMO0002 Reserved for concepts with insufficient information to code with codable children: Secondary | ICD-10-CM | POA: Insufficient documentation

## 2013-02-08 DIAGNOSIS — Z3201 Encounter for pregnancy test, result positive: Secondary | ICD-10-CM

## 2013-02-08 DIAGNOSIS — O09529 Supervision of elderly multigravida, unspecified trimester: Secondary | ICD-10-CM | POA: Insufficient documentation

## 2013-02-08 DIAGNOSIS — O0990 Supervision of high risk pregnancy, unspecified, unspecified trimester: Secondary | ICD-10-CM | POA: Insufficient documentation

## 2013-02-08 DIAGNOSIS — K219 Gastro-esophageal reflux disease without esophagitis: Secondary | ICD-10-CM

## 2013-02-08 LAB — POCT URINALYSIS DIPSTICK
Blood, UA: NEGATIVE
Glucose, UA: NEGATIVE
Ketones, UA: 1
Leukocytes, UA: NEGATIVE
Nitrite, UA: NEGATIVE
Spec Grav, UA: 1.02
pH, UA: 6

## 2013-02-08 MED ORDER — MONTELUKAST SODIUM 10 MG PO TABS: 10.0000 mg | ORAL_TABLET | Freq: Every day | ORAL | Status: DC

## 2013-02-08 MED ORDER — OMEPRAZOLE MAGNESIUM 20 MG PO TBEC: DELAYED_RELEASE_TABLET | ORAL | Status: DC

## 2013-02-08 NOTE — Progress Notes (Signed)
P 112 Subjective:    Christina [name] is being seen today for her first obstetrical visit.  This is not a planned pregnancy. She is at [redacted]w[redacted]d gestation. Her obstetrical history is significant for advanced maternal age, obesity and diabetes. Relationship with FOB: spouse, living together. Patient unsure intend to breast feed. Pregnancy history fully reviewed.  Menstrual History: OB History   Grav Para Term Preterm Abortions TAB SAB Ect Mult Living   2 1  1      1       Menarche age: 73 Patient's last menstrual period was 09/17/2012.    The following portions of the patient's history were reviewed and updated as appropriate: allergies, current medications, past family history, past medical history, past social history, past surgical history and problem list.  Review of Systems Pertinent items are noted in HPI.    Objective:   No exam today  Assessment:    Pregnancy at [redacted]w[redacted]d weeks  Pre-gestational DM--glycemic control adequate   Plan:    Review previous records Continue prenatal vitamins.  Counseling provided regarding continued use of seat belts, cessation of alcohol consumption, smoking or use of illicit drugs; infection precautions i.e., influenza/TDAP immunizations, toxoplasmosis,CMV, parvovirus, listeria and varicella; workplace safety, exercise during pregnancy; routine dental care, safe medications, sexual activity, hot tubs, saunas, pools, travel, caffeine use, fish and methlymercury, potential toxins, hair treatments, varicose veins Weight gain recommendations reviewed: underweight/BMI< 18.5--> gain 28 - 40 lbs; normal weight/BMI 18.5 - 24.9--> gain 25 - 25 lbs; overweight/BMI 25 - 29.9--> gain 15 - 25 lbs; obese/BMI >30->gain  11 - 20 lbs Problem list reviewed and updated. Has glucagon pen Referral-->MFM Follow up in 2 weeks. 50% of 15 min visit spent on counseling and coordination of care.

## 2013-02-08 NOTE — Patient Instructions (Signed)
Pregnancy - Second Trimester The second trimester is the period between 13 to 27 weeks of your pregnancy. It is important to follow your doctor's instructions. HOME CARE   Do not smoke.  Do not drink alcohol or use drugs.  Only take medicine as told by your doctor.  Take prenatal vitamins as told. The vitamin should contain 1 milligram of folic acid.  Exercise.  Eat healthy foods. Eat regular, well-balanced meals.  You can have sex (intercourse) if there are no other problems with the pregnancy.  Do not use hot tubs, steam rooms, or saunas.  Wear a seat belt while driving.  Avoid raw meat, uncooked cheese, and litter boxes and soil used by cats.  Visit your dentist. Cleanings are okay. GET HELP RIGHT AWAY IF:   You have a temperature by mouth above 102 F (38.9 C), not controlled by medicine.  Fluid is coming from your vagina.  Blood is coming from your vagina. Light spotting is common, especially after sex (intercourse).  You have a bad smelling fluid (discharge) coming from the vagina. The fluid changes from clear to white.  You still feel sick to your stomach (nauseous).  You throw up (vomit) blood.  You lose or gain more than 2 pounds (0.9 kilograms) of weight in a week, or as suggested by your doctor.  Your face, hands, feet, or legs get puffy (swell).  You get exposed to German measles and have never had them.  You get exposed to fifth disease or chickenpox.  You have belly (abdominal) pain.  You have a bad headache that will not go away.  You have watery poop (diarrhea), pain when you pee (urinate), or have shortness of breath.  You start to have problems seeing (blurry or double vision).  You fall, are in a car accident, or have any kind of trauma.  There is mental or physical violence at home.  You have any concerns or worries during your pregnancy. MAKE SURE YOU:   Understand these instructions.  Will watch your condition.  Will get help  right away if you are not doing well or get worse. Document Released: 10/14/2009 Document Revised: 10/12/2011 Document Reviewed: 10/14/2009 Exit[employer] Patient Information 2014 [employer].  

## 2013-02-22 ENCOUNTER — Ambulatory Visit (INDEPENDENT_AMBULATORY_CARE_PROVIDER_SITE_OTHER): Payer: BC Managed Care – PPO | Admitting: Obstetrics & Gynecology

## 2013-02-22 ENCOUNTER — Encounter: Payer: Self-pay | Admitting: Obstetrics & Gynecology

## 2013-02-22 VITALS — BP 106/74 | Temp 98.5°F | Wt 234.0 lb

## 2013-02-22 DIAGNOSIS — Z3480 Encounter for supervision of other normal pregnancy, unspecified trimester: Secondary | ICD-10-CM

## 2013-02-22 DIAGNOSIS — Z3482 Encounter for supervision of other normal pregnancy, second trimester: Secondary | ICD-10-CM

## 2013-02-22 DIAGNOSIS — K219 Gastro-esophageal reflux disease without esophagitis: Secondary | ICD-10-CM

## 2013-02-22 DIAGNOSIS — A6000 Herpesviral infection of urogenital system, unspecified: Secondary | ICD-10-CM

## 2013-02-22 LAB — POCT URINALYSIS DIPSTICK
Bilirubin, UA: NEGATIVE
Blood, UA: NEGATIVE
Glucose, UA: NEGATIVE
Ketones, UA: NEGATIVE
Leukocytes, UA: NEGATIVE
Nitrite, UA: NEGATIVE
Spec Grav, UA: 1.02
Urobilinogen, UA: NEGATIVE
pH, UA: 5

## 2013-02-22 MED ORDER — OMEPRAZOLE MAGNESIUM 20 MG PO TBEC: DELAYED_RELEASE_TABLET | ORAL | Status: DC

## 2013-02-22 MED ORDER — PRENATAL PLUS 27-1 MG PO TABS: 1.0000 | ORAL_TABLET | Freq: Every day | ORAL | Status: DC

## 2013-02-22 NOTE — Patient Instructions (Signed)
Pregnancy - Second Trimester The second trimester is the period between 13 to 27 weeks of your pregnancy. It is important to follow your doctor's instructions. HOME CARE   Do not smoke.  Do not drink alcohol or use drugs.  Only take medicine as told by your doctor.  Take prenatal vitamins as told. The vitamin should contain 1 milligram of folic acid.  Exercise.  Eat healthy foods. Eat regular, well-balanced meals.  You can have sex (intercourse) if there are no other problems with the pregnancy.  Do not use hot tubs, steam rooms, or saunas.  Wear a seat belt while driving.  Avoid raw meat, uncooked cheese, and litter boxes and soil used by cats.  Visit your dentist. Cleanings are okay. GET HELP RIGHT AWAY IF:   You have a temperature by mouth above 102 F (38.9 C), not controlled by medicine.  Fluid is coming from your vagina.  Blood is coming from your vagina. Light spotting is common, especially after sex (intercourse).  You have a bad smelling fluid (discharge) coming from the vagina. The fluid changes from clear to white.  You still feel sick to your stomach (nauseous).  You throw up (vomit) blood.  You lose or gain more than 2 pounds (0.9 kilograms) of weight in a week, or as suggested by your doctor.  Your face, hands, feet, or legs get puffy (swell).  You get exposed to German measles and have never had them.  You get exposed to fifth disease or chickenpox.  You have belly (abdominal) pain.  You have a bad headache that will not go away.  You have watery poop (diarrhea), pain when you pee (urinate), or have shortness of breath.  You start to have problems seeing (blurry or double vision).  You fall, are in a car accident, or have any kind of trauma.  There is mental or physical violence at home.  You have any concerns or worries during your pregnancy. MAKE SURE YOU:   Understand these instructions.  Will watch your condition.  Will get help  right away if you are not doing well or get worse. Document Released: 10/14/2009 Document Revised: 10/12/2011 Document Reviewed: 10/14/2009 Exit[employer] Patient Information 2014 [employer].  

## 2013-02-22 NOTE — Progress Notes (Signed)
CBGs in range.  Depression screening OK.

## 2013-02-22 NOTE — Progress Notes (Signed)
P- 106 Pt she is in need of Omeprazole Rx and Vitafol Rx. Pt states she has also having a foot fungus she is concerened about it. Pt states it has gotten worse in the past week. Pt states she noticed it May

## 2013-02-28 ENCOUNTER — Ambulatory Visit (INDEPENDENT_AMBULATORY_CARE_PROVIDER_SITE_OTHER): Payer: BC Managed Care – PPO | Admitting: Obstetrics & Gynecology

## 2013-02-28 ENCOUNTER — Other Ambulatory Visit: Payer: Self-pay | Admitting: *Deleted

## 2013-02-28 ENCOUNTER — Encounter: Payer: Self-pay | Admitting: Obstetrics & Gynecology

## 2013-02-28 VITALS — BP 118/79 | HR 114 | Temp 97.3°F | Ht 65.0 in | Wt 234.0 lb

## 2013-02-28 DIAGNOSIS — E119 Type 2 diabetes mellitus without complications: Secondary | ICD-10-CM

## 2013-02-28 DIAGNOSIS — Z3482 Encounter for supervision of other normal pregnancy, second trimester: Secondary | ICD-10-CM

## 2013-02-28 DIAGNOSIS — Z832 Family history of diseases of the blood and blood-forming organs and certain disorders involving the immune mechanism: Secondary | ICD-10-CM | POA: Insufficient documentation

## 2013-02-28 DIAGNOSIS — Z9289 Personal history of other medical treatment: Secondary | ICD-10-CM | POA: Insufficient documentation

## 2013-02-28 DIAGNOSIS — R7611 Nonspecific reaction to tuberculin skin test without active tuberculosis: Secondary | ICD-10-CM | POA: Insufficient documentation

## 2013-02-28 DIAGNOSIS — J302 Other seasonal allergic rhinitis: Secondary | ICD-10-CM | POA: Insufficient documentation

## 2013-03-02 ENCOUNTER — Encounter: Payer: Self-pay | Admitting: *Deleted

## 2013-03-02 NOTE — Progress Notes (Signed)
Per Arline Asp:  Reviewed diet with patient.  Patient to return in one week with blood sugar log. Will adjust Insulin at the next appointment based on blood sugars. Patient to start a baby aspirin. Needs a baseline EKG. Needs to have an eye exam.  Needs HgbA1C Need to get records from [facility] regardingher targeted ultrasound and the Fetal Echo Cardiogram she has had.

## 2013-03-03 ENCOUNTER — Encounter: Payer: Self-pay | Admitting: Obstetrics

## 2013-03-06 ENCOUNTER — Encounter: Payer: Self-pay | Admitting: Obstetrics & Gynecology

## 2013-03-06 DIAGNOSIS — B009 Herpesviral infection, unspecified: Secondary | ICD-10-CM | POA: Insufficient documentation

## 2013-03-07 ENCOUNTER — Encounter: Payer: Self-pay | Admitting: Obstetrics & Gynecology

## 2013-03-08 ENCOUNTER — Ambulatory Visit (INDEPENDENT_AMBULATORY_CARE_PROVIDER_SITE_OTHER): Payer: BC Managed Care – PPO | Admitting: Obstetrics & Gynecology

## 2013-03-08 ENCOUNTER — Ambulatory Visit (INDEPENDENT_AMBULATORY_CARE_PROVIDER_SITE_OTHER): Payer: BC Managed Care – PPO

## 2013-03-08 ENCOUNTER — Encounter: Payer: Self-pay | Admitting: Obstetrics & Gynecology

## 2013-03-08 DIAGNOSIS — O24912 Unspecified diabetes mellitus in pregnancy, second trimester: Secondary | ICD-10-CM

## 2013-03-08 DIAGNOSIS — K219 Gastro-esophageal reflux disease without esophagitis: Secondary | ICD-10-CM

## 2013-03-08 DIAGNOSIS — Z3480 Encounter for supervision of other normal pregnancy, unspecified trimester: Secondary | ICD-10-CM

## 2013-03-08 DIAGNOSIS — O24919 Unspecified diabetes mellitus in pregnancy, unspecified trimester: Secondary | ICD-10-CM

## 2013-03-08 DIAGNOSIS — O0990 Supervision of high risk pregnancy, unspecified, unspecified trimester: Secondary | ICD-10-CM

## 2013-03-08 DIAGNOSIS — Z3482 Encounter for supervision of other normal pregnancy, second trimester: Secondary | ICD-10-CM

## 2013-03-08 DIAGNOSIS — O09529 Supervision of elderly multigravida, unspecified trimester: Secondary | ICD-10-CM

## 2013-03-08 LAB — POCT URINALYSIS DIPSTICK
Bilirubin, UA: NEGATIVE
Blood, UA: NEGATIVE
Glucose, UA: NEGATIVE
Ketones, UA: NEGATIVE
Leukocytes, UA: NEGATIVE
Nitrite, UA: NEGATIVE
Protein, UA: NEGATIVE
Spec Grav, UA: 1.02
Urobilinogen, UA: NEGATIVE
pH, UA: 5

## 2013-03-08 LAB — US OB DETAIL + 14 WK

## 2013-03-08 MED ORDER — OMEPRAZOLE 20 MG PO CPDR: 20.0000 mg | DELAYED_RELEASE_CAPSULE | Freq: Every day | ORAL | Status: DC

## 2013-03-08 MED ORDER — PRENATAL PLUS 27-1 MG PO TABS: 1.0000 | ORAL_TABLET | Freq: Every day | ORAL | Status: DC

## 2013-03-08 NOTE — Progress Notes (Signed)
Pulse-102 Pt reports MFM is requesting to have labs done today.

## 2013-03-09 ENCOUNTER — Encounter: Payer: Self-pay | Admitting: Obstetrics & Gynecology

## 2013-03-09 LAB — HEMOGLOBIN A1C
Hgb A1c MFr Bld: 6.4 % — ABNORMAL HIGH (ref <5.7)
Mean Plasma Glucose: 137 mg/dL — ABNORMAL HIGH (ref <117)

## 2013-03-10 ENCOUNTER — Institutional Professional Consult (permissible substitution): Payer: BC Managed Care – PPO

## 2013-03-10 ENCOUNTER — Encounter: Payer: Self-pay | Admitting: Obstetrics

## 2013-03-15 ENCOUNTER — Encounter: Payer: Self-pay | Admitting: Obstetrics & Gynecology

## 2013-03-15 ENCOUNTER — Ambulatory Visit (INDEPENDENT_AMBULATORY_CARE_PROVIDER_SITE_OTHER): Payer: BC Managed Care – PPO | Admitting: Obstetrics & Gynecology

## 2013-03-15 VITALS — BP 118/77 | Temp 98.2°F | Wt 238.2 lb

## 2013-03-15 DIAGNOSIS — Z3482 Encounter for supervision of other normal pregnancy, second trimester: Secondary | ICD-10-CM

## 2013-03-15 DIAGNOSIS — Z23 Encounter for immunization: Secondary | ICD-10-CM

## 2013-03-15 DIAGNOSIS — Z3480 Encounter for supervision of other normal pregnancy, unspecified trimester: Secondary | ICD-10-CM

## 2013-03-15 LAB — POCT URINALYSIS DIPSTICK
Bilirubin, UA: NEGATIVE
Blood, UA: NEGATIVE
Glucose, UA: NEGATIVE
Leukocytes, UA: NEGATIVE
Nitrite, UA: NEGATIVE
Spec Grav, UA: 1.02
Urobilinogen, UA: NEGATIVE
pH, UA: 5

## 2013-03-15 MED ORDER — ASPIRIN EC 81 MG PO TBEC: 81.0000 mg | DELAYED_RELEASE_TABLET | Freq: Every day | ORAL | Status: DC

## 2013-03-15 NOTE — Progress Notes (Signed)
Pulse: 106

## 2013-03-16 ENCOUNTER — Encounter: Payer: Self-pay | Admitting: Obstetrics & Gynecology

## 2013-03-16 NOTE — Progress Notes (Signed)
CBGs borderline.  EKG OK.

## 2013-03-17 ENCOUNTER — Encounter: Payer: Self-pay | Admitting: Obstetrics & Gynecology

## 2013-03-18 ENCOUNTER — Encounter (HOSPITAL_COMMUNITY): Payer: Self-pay | Admitting: *Deleted

## 2013-03-18 ENCOUNTER — Inpatient Hospital Stay (HOSPITAL_COMMUNITY)
Admission: AD | Admit: 2013-03-18 | Discharge: 2013-03-18 | Disposition: A | Discharge status: Home / Self Care | Payer: BC Managed Care – PPO | Source: Ambulatory Visit | Attending: Obstetrics & Gynecology | Admitting: Obstetrics & Gynecology

## 2013-03-18 DIAGNOSIS — O24919 Unspecified diabetes mellitus in pregnancy, unspecified trimester: Secondary | ICD-10-CM | POA: Insufficient documentation

## 2013-03-18 DIAGNOSIS — O99891 Other specified diseases and conditions complicating pregnancy: Secondary | ICD-10-CM | POA: Insufficient documentation

## 2013-03-18 DIAGNOSIS — R109 Unspecified abdominal pain: Secondary | ICD-10-CM | POA: Insufficient documentation

## 2013-03-18 DIAGNOSIS — O368390 Maternal care for abnormalities of the fetal heart rate or rhythm, unspecified trimester, not applicable or unspecified: Secondary | ICD-10-CM | POA: Insufficient documentation

## 2013-03-18 DIAGNOSIS — E119 Type 2 diabetes mellitus without complications: Secondary | ICD-10-CM | POA: Insufficient documentation

## 2013-03-18 DIAGNOSIS — O26859 Spotting complicating pregnancy, unspecified trimester: Secondary | ICD-10-CM | POA: Insufficient documentation

## 2013-03-18 DIAGNOSIS — E860 Dehydration: Secondary | ICD-10-CM

## 2013-03-18 DIAGNOSIS — N949 Unspecified condition associated with female genital organs and menstrual cycle: Secondary | ICD-10-CM | POA: Insufficient documentation

## 2013-03-18 LAB — URINALYSIS, ROUTINE W REFLEX MICROSCOPIC
Bilirubin Urine: NEGATIVE
Glucose, UA: NEGATIVE mg/dL
Hgb urine dipstick: NEGATIVE
Ketones, ur: 15 mg/dL — AB
Leukocytes, UA: NEGATIVE
Nitrite: NEGATIVE
Protein, ur: NEGATIVE mg/dL
Specific Gravity, Urine: >1.03 — ABNORMAL HIGH (ref 1.005–1.030)
Urobilinogen, UA: 1 mg/dL (ref 0.0–1.0)
pH: 6 (ref 5.0–8.0)

## 2013-03-18 LAB — WET PREP, GENITAL
Clue Cells Wet Prep HPF POC: NONE SEEN
Trich, Wet Prep: NONE SEEN
Yeast Wet Prep HPF POC: NONE SEEN

## 2013-03-18 NOTE — MAU Provider Note (Signed)
History     CSN: [id number]  Arrival date and time: 03/18/13 1653   First Provider Initiated Contact with Patient 03/18/13 1738      Chief Complaint  Patient presents with  . Vaginal Discharge   HPI Comments: Christina [name] is a patient of Dr Tamela Oddi who is G2P1 and [redacted] weeks pregnant. She is Type ll diabetic. She started pain pink spotting this morning and low pelvic discomforts around noon.    Vaginal Discharge The patient's primary symptoms include a vaginal discharge.      Past Medical History  Diagnosis Date  . Hypertension   . Diabetes mellitus   . GERD (gastroesophageal reflux disease)   . Bipolar disorder     Past Surgical History  Procedure Laterality Date  . Breast reduction surgery  1994  . Dermoid tumor  2000    Family History  Problem Relation Age of Onset  . Diabetes Mother   . Hypertension Mother   . Diabetes Father   . Hypertension Father   . Asthma Father     History  Substance Use Topics  . Smoking status: Never Smoker   . Smokeless tobacco: Never Used  . Alcohol Use: No    Allergies:  Allergies  Allergen Reactions  . Peanut-Containing Drug Products Anaphylaxis    Prescriptions prior to admission  Medication Sig Dispense Refill  . acetaminophen (TYLENOL) 500 MG tablet Take 500 mg by mouth every 6 (six) hours as needed (headache).      Marland Kitchen aspirin EC 81 MG tablet Take 1 tablet (81 mg total) by mouth daily.  30 tablet  6  . folic acid (FOLVITE) 1 MG tablet Take 1 mg by mouth 3 (three) times daily.      . insulin lispro (HUMALOG) 100 UNIT/ML injection Inject 6 Units into the skin 3 (three) times daily before meals.       . insulin NPH (HUMULIN N,NOVOLIN N) 100 UNIT/ML injection 2 (two) times daily at 8 am and 10 pm. Pt to inject 14 units in AM and 10 units at bedtime.      Marland Kitchen loratadine (CLARITIN) 10 MG tablet Take 10 mg by mouth daily.       . metformin (FORTAMET) 1000 MG (OSM) 24 hr tablet Take 1,000 mg by mouth 2 (two) times daily  with a meal.      . methyldopa (ALDOMET) 500 MG tablet Take 500 mg by mouth 2 (two) times daily.      . montelukast (SINGULAIR) 10 MG tablet Take 1 tablet (10 mg total) by mouth at bedtime.  30 tablet  2  . omeprazole (PRILOSEC OTC) 20 MG tablet Take 20 mg by mouth daily. 1 po 30 minutes prior to breakfast      . Prenatal Vit-Fe Fumarate-FA (PRENATAL MULTIVITAMIN) TABS tablet Take 1 tablet by mouth daily at 12 noon.      . [DISCONTINUED] omeprazole (PRILOSEC OTC) 20 MG tablet 1 po 30 minutes prior to breakfast  30 tablet  2  . metoCLOPramide (REGLAN) 5 MG tablet Take 5 mg by mouth daily as needed (diabetic gastropearesis). One tab by mouth 30 mins prior to breakfast, lunch, and dinner      . valACYclovir (VALTREX) 500 MG tablet Take 500 mg by mouth 2 (two) times daily.      . [DISCONTINUED] metoCLOPramide (REGLAN) 5 MG tablet One tab by mouth 30 mins prior to breakfast, lunch, and dinner  90 tablet  5    Review of Systems  Genitourinary: Positive for vaginal discharge.  See HPI Physical Exam   Blood pressure 126/64, pulse 103, temperature 98.1 F (36.7 C), temperature source Oral, resp. rate 18, height 5\' 5"  (1.651 m), weight 108.047 kg (238 lb 3.2 oz), last menstrual period 09/17/2012.  Physical Exam  Constitutional: She is oriented to person, place, and time. She appears well-developed and well-nourished.  HENT:  Head: Normocephalic and atraumatic.  Eyes: Pupils are equal, round, and reactive to light.  GI: Soft. Bowel sounds are normal. She exhibits no distension and no mass. There is no tenderness. There is no rebound and no guarding.  Genitourinary:  Pelvic exam: External negative. Vaginal vault no blood. Cervix no bleeding, closed, long.  Musculoskeletal: Normal range of motion.  Neurological: She is alert and oriented to person, place, and time.  Skin: Skin is warm and dry.  Psychiatric: She has a normal mood and affect. Her behavior is normal. Judgment and thought content  normal.   Results for orders placed during the hospital encounter of 03/18/13 (from the past 24 hour(s))  URINALYSIS, ROUTINE W REFLEX MICROSCOPIC     Status: Abnormal   Collection Time    03/18/13  5:15 PM      Result Value Range   Color, Urine YELLOW  YELLOW   APPearance CLEAR  CLEAR   Specific Gravity, Urine >1.030 (*) 1.005 - 1.030   pH 6.0  5.0 - 8.0   Glucose, UA NEGATIVE  NEGATIVE mg/dL   Hgb urine dipstick NEGATIVE  NEGATIVE   Bilirubin Urine NEGATIVE  NEGATIVE   Ketones, ur 15 (*) NEGATIVE mg/dL   Protein, ur NEGATIVE  NEGATIVE mg/dL   Urobilinogen, UA 1.0  0.0 - 1.0 mg/dL   Nitrite NEGATIVE  NEGATIVE   Leukocytes, UA NEGATIVE  NEGATIVE  WET PREP, GENITAL     Status: Abnormal   Collection Time    03/18/13  5:50 PM      Result Value Range   Yeast Wet Prep HPF POC NONE SEEN  NONE SEEN   Trich, Wet Prep NONE SEEN  NONE SEEN   Clue Cells Wet Prep HPF POC NONE SEEN  NONE SEEN   WBC, Wet Prep HPF POC MANY (*) NONE SEEN   Fetal Monitoring Baseline 145 Variability + Acceleration 10 x 10 Decelerations variables  TOCO: No contractions, no irritability   MAU Course  Procedures  MDM: Call Dr Gaynell Face  Assessment and Plan   A:Round Ligament Pain P: Hydration Abd support Tylenol prn F/U with Dr [name], [name] 03/18/2013, 5:58 PM

## 2013-03-18 NOTE — MAU Note (Signed)
Pt reports she had a pink thick mucusy discharge that came out several times today. Started having some abd tightening on and off this afternoon. Pt is type 2 diabetic and had PIH with last pregnancy.

## 2013-03-20 ENCOUNTER — Encounter: Payer: Self-pay | Admitting: Obstetrics & Gynecology

## 2013-03-21 ENCOUNTER — Encounter: Payer: Self-pay | Admitting: Obstetrics & Gynecology

## 2013-03-21 ENCOUNTER — Institutional Professional Consult (permissible substitution): Payer: BC Managed Care – PPO

## 2013-03-23 ENCOUNTER — Encounter: Payer: Self-pay | Admitting: *Deleted

## 2013-03-24 ENCOUNTER — Encounter: Payer: Self-pay | Admitting: Obstetrics & Gynecology

## 2013-03-28 ENCOUNTER — Encounter: Payer: Self-pay | Admitting: Obstetrics & Gynecology

## 2013-03-29 ENCOUNTER — Ambulatory Visit (INDEPENDENT_AMBULATORY_CARE_PROVIDER_SITE_OTHER): Payer: BC Managed Care – PPO | Admitting: Obstetrics & Gynecology

## 2013-03-29 VITALS — BP 122/82 | Temp 98.2°F | Wt 240.0 lb

## 2013-03-29 DIAGNOSIS — Z3483 Encounter for supervision of other normal pregnancy, third trimester: Secondary | ICD-10-CM

## 2013-03-29 DIAGNOSIS — Z3480 Encounter for supervision of other normal pregnancy, unspecified trimester: Secondary | ICD-10-CM

## 2013-03-29 DIAGNOSIS — Z3482 Encounter for supervision of other normal pregnancy, second trimester: Secondary | ICD-10-CM

## 2013-03-29 LAB — POCT URINALYSIS DIPSTICK
Bilirubin, UA: NEGATIVE
Blood, UA: NEGATIVE
Glucose, UA: NEGATIVE
Ketones, UA: NEGATIVE
Leukocytes, UA: NEGATIVE
Nitrite, UA: NEGATIVE
Protein, UA: NEGATIVE
Spec Grav, UA: 1.01
Urobilinogen, UA: NEGATIVE
pH, UA: 6

## 2013-03-29 NOTE — Progress Notes (Signed)
Pulse: 108

## 2013-03-30 ENCOUNTER — Encounter: Payer: Self-pay | Admitting: Obstetrics & Gynecology

## 2013-04-02 ENCOUNTER — Encounter (HOSPITAL_COMMUNITY): Payer: Self-pay

## 2013-04-02 ENCOUNTER — Inpatient Hospital Stay (HOSPITAL_COMMUNITY)
Admission: AD | Admit: 2013-04-02 | Discharge: 2013-04-03 | Disposition: A | Discharge status: Home / Self Care | Payer: BC Managed Care – PPO | Source: Ambulatory Visit | Attending: Obstetrics & Gynecology | Admitting: Obstetrics & Gynecology

## 2013-04-02 DIAGNOSIS — O2390 Unspecified genitourinary tract infection in pregnancy, unspecified trimester: Secondary | ICD-10-CM | POA: Insufficient documentation

## 2013-04-02 DIAGNOSIS — N390 Urinary tract infection, site not specified: Secondary | ICD-10-CM | POA: Insufficient documentation

## 2013-04-02 DIAGNOSIS — N949 Unspecified condition associated with female genital organs and menstrual cycle: Secondary | ICD-10-CM | POA: Insufficient documentation

## 2013-04-02 HISTORY — DX: Herpesviral infection of urogenital system, unspecified: A60.00

## 2013-04-02 HISTORY — DX: Gastroparesis: K31.84

## 2013-04-02 LAB — URINALYSIS, ROUTINE W REFLEX MICROSCOPIC
Bilirubin Urine: NEGATIVE
Glucose, UA: NEGATIVE mg/dL
Hgb urine dipstick: NEGATIVE
Ketones, ur: NEGATIVE mg/dL
Leukocytes, UA: NEGATIVE
Nitrite: NEGATIVE
Protein, ur: NEGATIVE mg/dL
Specific Gravity, Urine: 1.01 (ref 1.005–1.030)
Urobilinogen, UA: 0.2 mg/dL (ref 0.0–1.0)
pH: 6 (ref 5.0–8.0)

## 2013-04-02 LAB — WET PREP, GENITAL
Clue Cells Wet Prep HPF POC: NONE SEEN
Trich, Wet Prep: NONE SEEN
Yeast Wet Prep HPF POC: NONE SEEN

## 2013-04-02 NOTE — MAU Provider Note (Signed)
History     CSN: [id number]  Arrival date and time: 04/02/13 2222   None     No chief complaint on file.  HPI Comments: Christina [name] 37 y.o. G2P0101 who is [redacted]w[redacted]d pregnant and presents to MAU with complaints of ongoing UTI. She called Dr Lodema Pilot office yesterday and was given a prescription for Macrobid. She c/o blood in her urine and a pressure in low pelvic area. She has taken 3 doses of Macrobid. Feels baby moving well.         Past Medical History  Diagnosis Date  . Hypertension   . Diabetes mellitus   . GERD (gastroesophageal reflux disease)   . Bipolar disorder   . Gastroparesis   . Genital HSV     Past Surgical History  Procedure Laterality Date  . Breast reduction surgery  1994  . Dermoid tumor  2000    Family History  Problem Relation Age of Onset  . Diabetes Mother   . Hypertension Mother   . Diabetes Father   . Hypertension Father   . Asthma Father     History  Substance Use Topics  . Smoking status: Never Smoker   . Smokeless tobacco: Never Used  . Alcohol Use: No    Allergies:  Allergies  Allergen Reactions  . Peanut-Containing Drug Products Anaphylaxis    Prescriptions prior to admission  Medication Sig Dispense Refill  . acetaminophen (TYLENOL) 500 MG tablet Take 500 mg by mouth every 6 (six) hours as needed (headache).      Marland Kitchen aspirin EC 81 MG tablet Take 1 tablet (81 mg total) by mouth daily.  30 tablet  6  . folic acid (FOLVITE) 1 MG tablet Take 1 mg by mouth 3 (three) times daily.      . insulin lispro (HUMALOG) 100 UNIT/ML injection Inject 6 Units into the skin 3 (three) times daily before meals.       . insulin NPH (HUMULIN N,NOVOLIN N) 100 UNIT/ML injection 2 (two) times daily at 8 am and 10 pm. Pt to inject 14 units in AM and 10 units at bedtime.      Marland Kitchen loratadine (CLARITIN) 10 MG tablet Take 10 mg by mouth daily.       . metformin (FORTAMET) 1000 MG (OSM) 24 hr tablet Take 1,000 mg by mouth 2 (two) times daily with a  meal.      . methyldopa (ALDOMET) 500 MG tablet Take 500 mg by mouth 2 (two) times daily.      . montelukast (SINGULAIR) 10 MG tablet Take 1 tablet (10 mg total) by mouth at bedtime.  30 tablet  2  . nitrofurantoin, macrocrystal-monohydrate, (MACROBID) 100 MG capsule Take 100 mg by mouth 2 (two) times daily.      Marland Kitchen omeprazole (PRILOSEC OTC) 20 MG tablet Take 20 mg by mouth daily. 1 po 30 minutes prior to breakfast      . phenazopyridine (PYRIDIUM) 95 MG tablet Take 95 mg by mouth 3 (three) times daily as needed for pain.      . Prenatal Vit-Fe Fumarate-FA (PRENATAL MULTIVITAMIN) TABS tablet Take 1 tablet by mouth daily at 12 noon.      . metoCLOPramide (REGLAN) 5 MG tablet Take 5 mg by mouth daily as needed (diabetic gastropearesis). One tab by mouth 30 mins prior to breakfast, lunch, and dinner      . valACYclovir (VALTREX) 500 MG tablet Take 500 mg by mouth 2 (two) times daily.  Review of Systems  Constitutional: Negative.   HENT: Negative.   Eyes: Negative.   Respiratory: Negative.   Cardiovascular: Negative.   Gastrointestinal: Negative.   Genitourinary: Positive for hematuria.       Feels like pressure  Skin: Negative.   Neurological: Negative.   Psychiatric/Behavioral: Negative.    Physical Exam   Blood pressure 141/86, pulse 108, temperature 99.1 F (37.3 C), temperature source Oral, resp. rate 18, last menstrual period 09/17/2012, SpO2 98.00%.  Physical Exam  Constitutional: She is oriented to person, place, and time. She appears well-developed and well-nourished. No distress.  HENT:  Head: Normocephalic and atraumatic.  Eyes: Pupils are equal, round, and reactive to light.  GI: Soft. She exhibits no distension and no mass. There is no tenderness. There is no rebound and no guarding.  Genitourinary:  Thick yellow discharge, cervix is friable and very small amount bleeding noted, cervix is closed and long  Musculoskeletal: Normal range of motion.  Neurological: She  is alert and oriented to person, place, and time.  Skin: Skin is warm and dry. She is not diaphoretic.  Psychiatric: She has a normal mood and affect. Her behavior is normal. Judgment normal.   Results for orders placed during the hospital encounter of 04/02/13 (from the past 24 hour(s))  URINALYSIS, ROUTINE W REFLEX MICROSCOPIC     Status: None   Collection Time    04/02/13 10:25 PM      Result Value Range   Color, Urine YELLOW  YELLOW   APPearance CLEAR  CLEAR   Specific Gravity, Urine 1.010  1.005 - 1.030   pH 6.0  5.0 - 8.0   Glucose, UA NEGATIVE  NEGATIVE mg/dL   Hgb urine dipstick NEGATIVE  NEGATIVE   Bilirubin Urine NEGATIVE  NEGATIVE   Ketones, ur NEGATIVE  NEGATIVE mg/dL   Protein, ur NEGATIVE  NEGATIVE mg/dL   Urobilinogen, UA 0.2  0.0 - 1.0 mg/dL   Nitrite NEGATIVE  NEGATIVE   Leukocytes, UA NEGATIVE  NEGATIVE  WET PREP, GENITAL     Status: Abnormal   Collection Time    04/02/13 11:25 PM      Result Value Range   Yeast Wet Prep HPF POC NONE SEEN  NONE SEEN   Trich, Wet Prep NONE SEEN  NONE SEEN   Clue Cells Wet Prep HPF POC NONE SEEN  NONE SEEN   WBC, Wet Prep HPF POC TOO NUMEROUS TO COUNT (*) NONE SEEN     MAU Course  Procedures  MDM Wet prep, UA  Assessment and Plan  A: UTI in pregnancy P: Treated with Macrobid by Dr [name], [name] 04/02/2013, 11:12 PM

## 2013-04-02 NOTE — MAU Note (Signed)
Pt c/o pain with urination, lower flank pain. States she feels a little disoriented. Denies fever, chills. States some nausea but no vomiting. Denies vaginal bleeding, LOF. States some vaginal discharge that is abnormal for her. States good FM.

## 2013-04-05 ENCOUNTER — Encounter: Payer: Self-pay | Admitting: Obstetrics & Gynecology

## 2013-04-05 ENCOUNTER — Ambulatory Visit (INDEPENDENT_AMBULATORY_CARE_PROVIDER_SITE_OTHER): Payer: BC Managed Care – PPO | Admitting: Obstetrics & Gynecology

## 2013-04-05 VITALS — BP 118/80 | Temp 99.2°F | Wt 240.0 lb

## 2013-04-05 DIAGNOSIS — R319 Hematuria, unspecified: Secondary | ICD-10-CM

## 2013-04-05 DIAGNOSIS — Z113 Encounter for screening for infections with a predominantly sexual mode of transmission: Secondary | ICD-10-CM

## 2013-04-05 DIAGNOSIS — Z3480 Encounter for supervision of other normal pregnancy, unspecified trimester: Secondary | ICD-10-CM

## 2013-04-05 DIAGNOSIS — Z3482 Encounter for supervision of other normal pregnancy, second trimester: Secondary | ICD-10-CM

## 2013-04-05 LAB — POCT URINALYSIS DIPSTICK
Bilirubin, UA: NEGATIVE
Blood, UA: NEGATIVE
Glucose, UA: NEGATIVE
Nitrite, UA: NEGATIVE
Protein, UA: NEGATIVE
Spec Grav, UA: 1.015
Urobilinogen, UA: NEGATIVE
pH, UA: 6

## 2013-04-05 NOTE — Progress Notes (Signed)
P 103 Patient was seen at the hospital- bloody show- exam normal

## 2013-04-06 LAB — CULTURE, OB URINE
Colony Count: NO GROWTH
Organism ID, Bacteria: NO GROWTH

## 2013-04-06 LAB — GC/CHLAMYDIA PROBE AMP
CT Probe RNA: NEGATIVE
GC Probe RNA: NEGATIVE

## 2013-04-07 ENCOUNTER — Encounter: Payer: Self-pay | Admitting: Obstetrics & Gynecology

## 2013-04-07 NOTE — Patient Instructions (Signed)
Urinary Tract Infection in Pregnancy  A urinary tract infection (UTI) is a bacterial infection of the urinary tract. Infection of the urinary tract can include the ureters, kidneys (pyelonephritis), bladder (cystitis), and urethra (urethritis). All pregnant women should be screened for bacteria in the urinary tract. Identifying and treating a UTI will decrease the risk of preterm labor and developing more serious infections in both the mother and baby.  CAUSES  Bacteria germs cause almost all UTIs. There are many factors that can increase your chances of getting a UTI during pregnancy. These include:   Having a short urethra.   Poor toilet and hygiene habits.   Sexual intercourse.   Blockage of urine along the urinary tract.   Problems with the pelvic muscles or nerves.   Diabetes.   Obesity.   Bladder problems after having several children.   Previous history of UTI.  SYMPTOMS    Pain, burning, or a stinging feeling when urinating.   Suddenly feeling the need to urinate right away (urgency).   Loss of bladder control (urinary incontinence).   Frequent urination, more than is common with pregnancy.   Lower abdominal or back discomfort.   Cloudy urine.   Blood in the urine (hematuria).   Fever.  When the kidneys are infected, the symptoms may be:   Back pain.   Flank pain on the right side more so than the left.   Fever.   Chills.   Nausea.   Vomiting.  DIAGNOSIS    Urine tests.   Additional tests and procedures may include:   Ultrasound of the kidneys, ureters, bladder, and urethra.   Looking in the bladder with a lighted tube (cystoscopy).   Certain X-ray studies only when absolutely necessary.  Finding out the results of your test  Ask when your test results will be ready. Make sure you get your test results.  TREATMENT   Antibiotic medicine by mouth.   Antibiotics given through the vein (intravenously), if needed.  HOME CARE INSTRUCTIONS    Take your antibiotics as directed. Finish  them even if you start to feel better. Only take medicine as directed by your caregiver.   Drink enough fluids to keep your urine clear or pale yellow.   Do not have sexual intercourse until the infection is gone and your caregiver says it is okay.   Make sure you are tested for UTIs throughout your pregnancy if you get one. These infections often come back.  Preventing a UTI in the future:   Practice good toilet habits. Always wipe from front to back. Use the tissue only once.   Do not hold your urine. Empty your bladder as soon as possible when the urge comes.   Do not douche or use deodorant sprays.   Wash with soap and warm water around the genital area and the anus.   Empty your bladder before and after sexual intercourse.   Wear underwear with a cotton crotch.   Avoid caffeine and carbonated drinks. They can irritate the bladder.   Drink cranberry juice or take cranberry pills. This may decrease the risk of getting a UTI.   Do not drink alcohol.   Keep all your appointments and tests as scheduled.  SEEK MEDICAL CARE IF:    Your symptoms get worse.   You are still having fevers 2 or more days after treatment begins.   You develop a rash.   You feel that you are having problems with medicines prescribed.   You   develop abnormal vaginal discharge.  SEEK IMMEDIATE MEDICAL CARE IF:    You develop back or flank pain.   You develop chills.   You have blood in your urine.   You develop nausea and vomiting.   You develop contractions of your uterus.   You have a gush of fluid from the vagina.  MAKE SURE YOU:    Understand these instructions.   Will watch your condition.   Will get help right away if you are not doing well or get worse.  Document Released: 11/14/2010 Document Revised: 07/06/2012 Document Reviewed: 11/14/2010  [employer] Patient Information 2014 [employer].

## 2013-04-07 NOTE — Progress Notes (Signed)
CBGs OK.  SPEC: no heme.

## 2013-04-10 ENCOUNTER — Other Ambulatory Visit: Payer: Self-pay | Admitting: *Deleted

## 2013-04-10 DIAGNOSIS — O0990 Supervision of high risk pregnancy, unspecified, unspecified trimester: Secondary | ICD-10-CM

## 2013-04-11 ENCOUNTER — Ambulatory Visit (INDEPENDENT_AMBULATORY_CARE_PROVIDER_SITE_OTHER): Payer: BC Managed Care – PPO | Admitting: Obstetrics & Gynecology

## 2013-04-11 VITALS — BP 125/87 | HR 111 | Temp 98.7°F | Wt 239.0 lb

## 2013-04-11 DIAGNOSIS — O0990 Supervision of high risk pregnancy, unspecified, unspecified trimester: Secondary | ICD-10-CM

## 2013-04-11 NOTE — Progress Notes (Signed)
Per Arline Asp with [facility] patient advised to continue Metformin,follow up in 2 weeks (9/23/014), increase NPH q am to 26 u, and NPH 16 QHS, increase Humalog at breakfast and dinner to 14u. Arline Asp inquired about Rx for glucagon for patient to pharmacy. She was advised no rx in file for gluacgon. Patient states she is scheduled for 04/12/2013 with Dr Tamela Oddi, and she will discuss the glucagon at that appointment. Patient informed Arline Asp of abscess in tooth that needed to be address right away. Arline Asp stated that she would check with the Dental clinic at [facility], however if there was a facility locally the patient could be seen at that would be preferred. Patient to see Scarlette Calico to schedule dental appointment. Arline Asp is also requesting the results of patients 24 hour urine testing. Patient states testing was done in March with another provider, however those records should be on file with our office.

## 2013-04-12 ENCOUNTER — Encounter: Payer: Self-pay | Admitting: Obstetrics & Gynecology

## 2013-04-12 ENCOUNTER — Ambulatory Visit (INDEPENDENT_AMBULATORY_CARE_PROVIDER_SITE_OTHER): Payer: BC Managed Care – PPO

## 2013-04-12 ENCOUNTER — Ambulatory Visit (INDEPENDENT_AMBULATORY_CARE_PROVIDER_SITE_OTHER): Payer: BC Managed Care – PPO | Admitting: Obstetrics & Gynecology

## 2013-04-12 VITALS — BP 123/83 | Temp 98.2°F | Wt 241.0 lb

## 2013-04-12 DIAGNOSIS — O10019 Pre-existing essential hypertension complicating pregnancy, unspecified trimester: Secondary | ICD-10-CM

## 2013-04-12 DIAGNOSIS — O24919 Unspecified diabetes mellitus in pregnancy, unspecified trimester: Secondary | ICD-10-CM

## 2013-04-12 DIAGNOSIS — O10913 Unspecified pre-existing hypertension complicating pregnancy, third trimester: Secondary | ICD-10-CM

## 2013-04-12 DIAGNOSIS — O09523 Supervision of elderly multigravida, third trimester: Secondary | ICD-10-CM

## 2013-04-12 DIAGNOSIS — Z3480 Encounter for supervision of other normal pregnancy, unspecified trimester: Secondary | ICD-10-CM

## 2013-04-12 DIAGNOSIS — O09529 Supervision of elderly multigravida, unspecified trimester: Secondary | ICD-10-CM

## 2013-04-12 DIAGNOSIS — O0990 Supervision of high risk pregnancy, unspecified, unspecified trimester: Secondary | ICD-10-CM

## 2013-04-12 DIAGNOSIS — E119 Type 2 diabetes mellitus without complications: Secondary | ICD-10-CM

## 2013-04-12 LAB — US OB DETAIL + 14 WK

## 2013-04-12 LAB — POCT URINALYSIS DIPSTICK
Bilirubin, UA: NEGATIVE
Blood, UA: NEGATIVE
Glucose, UA: NEGATIVE
Nitrite, UA: NEGATIVE
Spec Grav, UA: 1.025
Urobilinogen, UA: NEGATIVE
pH, UA: 5

## 2013-04-12 NOTE — Progress Notes (Signed)
P- 112 Pt states she has noticed some blood after urinating. Pt states she only sees it when she wipes. Pt states it a dark red.

## 2013-04-13 ENCOUNTER — Encounter: Payer: Self-pay | Admitting: Obstetrics & Gynecology

## 2013-04-13 LAB — US OB DETAIL + 14 WK

## 2013-04-13 NOTE — Progress Notes (Signed)
Elevated CBGs--undergoing treatment for an oral abscess.  ?PVB.  May need to d/c BASA.  Check fibrinogen next visit.  U/S OK.  Start fetal testing next visit.

## 2013-04-17 ENCOUNTER — Encounter: Payer: Self-pay | Admitting: Obstetrics & Gynecology

## 2013-04-18 ENCOUNTER — Ambulatory Visit (HOSPITAL_COMMUNITY): Payer: PRIVATE HEALTH INSURANCE | Admitting: Psychiatry

## 2013-04-19 ENCOUNTER — Encounter: Payer: Self-pay | Admitting: Obstetrics & Gynecology

## 2013-04-19 ENCOUNTER — Ambulatory Visit (INDEPENDENT_AMBULATORY_CARE_PROVIDER_SITE_OTHER): Payer: BC Managed Care – PPO | Admitting: Obstetrics & Gynecology

## 2013-04-19 VITALS — BP 125/78 | Temp 98.7°F | Wt 243.4 lb

## 2013-04-19 DIAGNOSIS — Z3480 Encounter for supervision of other normal pregnancy, unspecified trimester: Secondary | ICD-10-CM

## 2013-04-19 DIAGNOSIS — Z3483 Encounter for supervision of other normal pregnancy, third trimester: Secondary | ICD-10-CM

## 2013-04-19 LAB — POCT URINALYSIS DIPSTICK
Bilirubin, UA: NEGATIVE
Blood, UA: NEGATIVE
Leukocytes, UA: NEGATIVE
Nitrite, UA: NEGATIVE
Spec Grav, UA: 1.02
Urobilinogen, UA: NEGATIVE
pH, UA: 5

## 2013-04-19 NOTE — Patient Instructions (Signed)
Fetal Movement Counts Patient Name: __________________________________________________ Patient Due Date: ____________________ Performing a fetal movement count is highly recommended in high-risk pregnancies, but it is good for every pregnant woman to do. Your caregiver may ask you to start counting fetal movements at 28 weeks of the pregnancy. Fetal movements often increase:  After eating a full meal.  After physical activity.  After eating or drinking something sweet or cold.  At rest. Pay attention to when you feel the baby is most active. This will help you notice a pattern of your baby's sleep and wake cycles and what factors contribute to an increase in fetal movement. It is important to perform a fetal movement count at the same time each day when your baby is normally most active.  HOW TO COUNT FETAL MOVEMENTS 1. Find a quiet and comfortable area to sit or lie down on your left side. Lying on your left side provides the best blood and oxygen circulation to your baby. 2. Write down the day and time on a sheet of paper or in a journal. 3. Start counting kicks, flutters, swishes, rolls, or jabs in a 2 hour period. You should feel at least 10 movements within 2 hours. 4. If you do not feel 10 movements in 2 hours, wait 2 3 hours and count again. Look for a change in the pattern or not enough counts in 2 hours. SEEK MEDICAL CARE IF:  You feel less than 10 counts in 2 hours, tried twice.  There is no movement in over an hour.  The pattern is changing or taking longer each day to reach 10 counts in 2 hours.  You feel the baby is not moving as he or she usually does. Date: ____________ Movements: ____________ Start time: ____________ Finish time: ____________  Date: ____________ Movements: ____________ Start time: ____________ Finish time: ____________ Date: ____________ Movements: ____________ Start time: ____________ Finish time: ____________ Date: ____________ Movements: ____________  Start time: ____________ Finish time: ____________ Date: ____________ Movements: ____________ Start time: ____________ Finish time: ____________ Date: ____________ Movements: ____________ Start time: ____________ Finish time: ____________ Date: ____________ Movements: ____________ Start time: ____________ Finish time: ____________ Date: ____________ Movements: ____________ Start time: ____________ Finish time: ____________  Date: ____________ Movements: ____________ Start time: ____________ Finish time: ____________ Date: ____________ Movements: ____________ Start time: ____________ Finish time: ____________ Date: ____________ Movements: ____________ Start time: ____________ Finish time: ____________ Date: ____________ Movements: ____________ Start time: ____________ Finish time: ____________ Date: ____________ Movements: ____________ Start time: ____________ Finish time: ____________ Date: ____________ Movements: ____________ Start time: ____________ Finish time: ____________ Date: ____________ Movements: ____________ Start time: ____________ Finish time: ____________  Date: ____________ Movements: ____________ Start time: ____________ Finish time: ____________ Date: ____________ Movements: ____________ Start time: ____________ Finish time: ____________ Date: ____________ Movements: ____________ Start time: ____________ Finish time: ____________ Date: ____________ Movements: ____________ Start time: ____________ Finish time: ____________ Date: ____________ Movements: ____________ Start time: ____________ Finish time: ____________ Date: ____________ Movements: ____________ Start time: ____________ Finish time: ____________ Date: ____________ Movements: ____________ Start time: ____________ Finish time: ____________  Date: ____________ Movements: ____________ Start time: ____________ Finish time: ____________ Date: ____________ Movements: ____________ Start time: ____________ Finish time:  ____________ Date: ____________ Movements: ____________ Start time: ____________ Finish time: ____________ Date: ____________ Movements: ____________ Start time: ____________ Finish time: ____________ Date: ____________ Movements: ____________ Start time: ____________ Finish time: ____________ Date: ____________ Movements: ____________ Start time: ____________ Finish time: ____________ Date: ____________ Movements: ____________ Start time: ____________ Finish time: ____________  Date: ____________ Movements: ____________ Start time: ____________ Finish   time: ____________ Date: ____________ Movements: ____________ Start time: ____________ Finish time: ____________ Date: ____________ Movements: ____________ Start time: ____________ Finish time: ____________ Date: ____________ Movements: ____________ Start time: ____________ Finish time: ____________ Date: ____________ Movements: ____________ Start time: ____________ Finish time: ____________ Date: ____________ Movements: ____________ Start time: ____________ Finish time: ____________ Date: ____________ Movements: ____________ Start time: ____________ Finish time: ____________  Date: ____________ Movements: ____________ Start time: ____________ Finish time: ____________ Date: ____________ Movements: ____________ Start time: ____________ Finish time: ____________ Date: ____________ Movements: ____________ Start time: ____________ Finish time: ____________ Date: ____________ Movements: ____________ Start time: ____________ Finish time: ____________ Date: ____________ Movements: ____________ Start time: ____________ Finish time: ____________ Date: ____________ Movements: ____________ Start time: ____________ Finish time: ____________ Date: ____________ Movements: ____________ Start time: ____________ Finish time: ____________  Date: ____________ Movements: ____________ Start time: ____________ Finish time: ____________ Date: ____________ Movements:  ____________ Start time: ____________ Finish time: ____________ Date: ____________ Movements: ____________ Start time: ____________ Finish time: ____________ Date: ____________ Movements: ____________ Start time: ____________ Finish time: ____________ Date: ____________ Movements: ____________ Start time: ____________ Finish time: ____________ Date: ____________ Movements: ____________ Start time: ____________ Finish time: ____________ Date: ____________ Movements: ____________ Start time: ____________ Finish time: ____________  Date: ____________ Movements: ____________ Start time: ____________ Finish time: ____________ Date: ____________ Movements: ____________ Start time: ____________ Finish time: ____________ Date: ____________ Movements: ____________ Start time: ____________ Finish time: ____________ Date: ____________ Movements: ____________ Start time: ____________ Finish time: ____________ Date: ____________ Movements: ____________ Start time: ____________ Finish time: ____________ Date: ____________ Movements: ____________ Start time: ____________ Finish time: ____________ Document Released: 08/19/2006 Document Revised: 07/06/2012 Document Reviewed: 05/16/2012 [employer] Patient Information 2014 [employer].  

## 2013-04-19 NOTE — Progress Notes (Signed)
Pulse: 114 

## 2013-04-19 NOTE — Progress Notes (Signed)
Still has not had an appointment with oral surgeon.  Referral->provider at [facility]/telemedicine w/[facility] NP  CBGs in range.  No further hematuria.

## 2013-04-20 ENCOUNTER — Encounter: Payer: Self-pay | Admitting: Obstetrics & Gynecology

## 2013-04-21 ENCOUNTER — Encounter: Payer: Self-pay | Admitting: Obstetrics & Gynecology

## 2013-04-25 ENCOUNTER — Institutional Professional Consult (permissible substitution): Payer: BC Managed Care – PPO

## 2013-04-26 ENCOUNTER — Encounter: Payer: Self-pay | Admitting: Obstetrics & Gynecology

## 2013-04-26 ENCOUNTER — Ambulatory Visit (INDEPENDENT_AMBULATORY_CARE_PROVIDER_SITE_OTHER): Payer: BC Managed Care – PPO | Admitting: Obstetrics & Gynecology

## 2013-04-26 VITALS — BP 113/78 | Temp 98.9°F | Wt 239.0 lb

## 2013-04-26 DIAGNOSIS — Z3480 Encounter for supervision of other normal pregnancy, unspecified trimester: Secondary | ICD-10-CM

## 2013-04-26 DIAGNOSIS — Z3483 Encounter for supervision of other normal pregnancy, third trimester: Secondary | ICD-10-CM

## 2013-04-26 DIAGNOSIS — O24919 Unspecified diabetes mellitus in pregnancy, unspecified trimester: Secondary | ICD-10-CM

## 2013-04-26 DIAGNOSIS — E119 Type 2 diabetes mellitus without complications: Secondary | ICD-10-CM

## 2013-04-26 LAB — POCT URINALYSIS DIPSTICK
Bilirubin, UA: NEGATIVE
Glucose, UA: NEGATIVE
Leukocytes, UA: NEGATIVE
Nitrite, UA: NEGATIVE
Spec Grav, UA: 1.015
Urobilinogen, UA: NEGATIVE
pH, UA: 5

## 2013-04-26 LAB — GLUCOSE, POCT (MANUAL RESULT ENTRY): POC Glucose: 100 mg/dl — AB (ref 70–99)

## 2013-04-26 MED ORDER — MONTELUKAST SODIUM 10 MG PO TABS: 10.0000 mg | ORAL_TABLET | Freq: Every day | ORAL | Status: DC

## 2013-04-26 NOTE — Progress Notes (Signed)
Viral gastroenteritis.  CBGs OK.

## 2013-04-26 NOTE — Patient Instructions (Signed)
Viral Gastroenteritis Viral gastroenteritis is also known as stomach flu. This condition affects the stomach and intestinal tract. It can cause sudden diarrhea and vomiting. The illness typically lasts 3 to 8 days. Most people develop an immune response that eventually gets rid of the virus. While this natural response develops, the virus can make you quite ill. CAUSES  Many different viruses can cause gastroenteritis, such as rotavirus or noroviruses. You can catch one of these viruses by consuming contaminated food or water. You may also catch a virus by sharing utensils or other personal items with an infected person or by touching a contaminated surface. SYMPTOMS  The most common symptoms are diarrhea and vomiting. These problems can cause a severe loss of body fluids (dehydration) and a body salt (electrolyte) imbalance. Other symptoms may include:  Fever.  Headache.  Fatigue.  Abdominal pain. DIAGNOSIS  Your caregiver can usually diagnose viral gastroenteritis based on your symptoms and a physical exam. A stool sample may also be taken to test for the presence of viruses or other infections. TREATMENT  This illness typically goes away on its own. Treatments are aimed at rehydration. The most serious cases of viral gastroenteritis involve vomiting so severely that you are not able to keep fluids down. In these cases, fluids must be given through an intravenous line (IV). HOME CARE INSTRUCTIONS   Drink enough fluids to keep your urine clear or pale yellow. Drink small amounts of fluids frequently and increase the amounts as tolerated.  Ask your caregiver for specific rehydration instructions.  Avoid:  Foods high in sugar.  Alcohol.  Carbonated drinks.  Tobacco.  Juice.  Caffeine drinks.  Extremely hot or cold fluids.  Fatty, greasy foods.  Too much intake of anything at one time.  Dairy products until 24 to 48 hours after diarrhea stops.  You may consume probiotics.  Probiotics are active cultures of beneficial bacteria. They may lessen the amount and number of diarrheal stools in adults. Probiotics can be found in yogurt with active cultures and in supplements.  Wash your hands well to avoid spreading the virus.  Only take over-the-counter or prescription medicines for pain, discomfort, or fever as directed by your caregiver. Do not give aspirin to children. Antidiarrheal medicines are not recommended.  Ask your caregiver if you should continue to take your regular prescribed and over-the-counter medicines.  Keep all follow-up appointments as directed by your caregiver. SEEK IMMEDIATE MEDICAL CARE IF:   You are unable to keep fluids down.  You do not urinate at least once every 6 to 8 hours.  You develop shortness of breath.  You notice blood in your stool or vomit. This may look like coffee grounds.  You have abdominal pain that increases or is concentrated in one small area (localized).  You have persistent vomiting or diarrhea.  You have a fever.  The patient is a child younger than 3 months, and he or she has a fever.  The patient is a child older than 3 months, and he or she has a fever and persistent symptoms.  The patient is a child older than 3 months, and he or she has a fever and symptoms suddenly get worse.  The patient is a baby, and he or she has no tears when crying. MAKE SURE YOU:   Understand these instructions.  Will watch your condition.  Will get help right away if you are not doing well or get worse. Document Released: 07/20/2005 Document Revised: 10/12/2011 Document Reviewed: 05/06/2011   [employer] Patient Information 2014 [employer]  

## 2013-04-26 NOTE — Progress Notes (Signed)
Pulse 114, patient states she has no concerns orthostatic BP'S laying  108/75- 94 109/74 -93 112/77-97

## 2013-05-02 ENCOUNTER — Encounter: Payer: Self-pay | Admitting: Obstetrics & Gynecology

## 2013-05-04 ENCOUNTER — Other Ambulatory Visit: Payer: PRIVATE HEALTH INSURANCE

## 2013-05-04 ENCOUNTER — Other Ambulatory Visit: Payer: Self-pay | Admitting: *Deleted

## 2013-05-04 ENCOUNTER — Ambulatory Visit (INDEPENDENT_AMBULATORY_CARE_PROVIDER_SITE_OTHER): Payer: Medicaid Other | Admitting: Obstetrics & Gynecology

## 2013-05-04 VITALS — BP 128/75 | Temp 98.2°F | Wt 247.0 lb

## 2013-05-04 DIAGNOSIS — E119 Type 2 diabetes mellitus without complications: Secondary | ICD-10-CM

## 2013-05-04 DIAGNOSIS — O24919 Unspecified diabetes mellitus in pregnancy, unspecified trimester: Secondary | ICD-10-CM

## 2013-05-04 DIAGNOSIS — O09529 Supervision of elderly multigravida, unspecified trimester: Secondary | ICD-10-CM

## 2013-05-04 DIAGNOSIS — Z3480 Encounter for supervision of other normal pregnancy, unspecified trimester: Secondary | ICD-10-CM

## 2013-05-04 LAB — POCT URINALYSIS DIPSTICK
Bilirubin, UA: NEGATIVE
Blood, UA: NEGATIVE
Glucose, UA: NEGATIVE
Ketones, UA: NEGATIVE
Nitrite, UA: NEGATIVE
Spec Grav, UA: 1.02
Urobilinogen, UA: NEGATIVE
pH, UA: 5

## 2013-05-04 NOTE — Progress Notes (Signed)
Pulse- 121 Pt states she is feeling her heart racing and a small amount of SOB. She has URI symptoms.  CBGs labile.

## 2013-05-05 ENCOUNTER — Other Ambulatory Visit: Payer: Self-pay | Admitting: *Deleted

## 2013-05-05 ENCOUNTER — Encounter: Payer: Self-pay | Admitting: Obstetrics & Gynecology

## 2013-05-05 MED ORDER — METHYLDOPA 500 MG PO TABS: 500.0000 mg | ORAL_TABLET | Freq: Two times a day (BID) | ORAL | Status: DC

## 2013-05-05 NOTE — Progress Notes (Signed)
Patient called and requested a refill on her blood pressure medication.  Script sent to pharmacy per Dr. Tamela Oddi.

## 2013-05-08 ENCOUNTER — Other Ambulatory Visit: Payer: PRIVATE HEALTH INSURANCE

## 2013-05-08 ENCOUNTER — Encounter: Payer: Self-pay | Admitting: Obstetrics & Gynecology

## 2013-05-08 ENCOUNTER — Ambulatory Visit (INDEPENDENT_AMBULATORY_CARE_PROVIDER_SITE_OTHER): Payer: Medicaid Other | Admitting: Obstetrics & Gynecology

## 2013-05-08 VITALS — BP 128/83 | Temp 97.7°F | Wt 247.6 lb

## 2013-05-08 DIAGNOSIS — O24919 Unspecified diabetes mellitus in pregnancy, unspecified trimester: Secondary | ICD-10-CM

## 2013-05-08 DIAGNOSIS — O09529 Supervision of elderly multigravida, unspecified trimester: Secondary | ICD-10-CM

## 2013-05-08 DIAGNOSIS — O0990 Supervision of high risk pregnancy, unspecified, unspecified trimester: Secondary | ICD-10-CM

## 2013-05-08 DIAGNOSIS — O0993 Supervision of high risk pregnancy, unspecified, third trimester: Secondary | ICD-10-CM

## 2013-05-08 DIAGNOSIS — J069 Acute upper respiratory infection, unspecified: Secondary | ICD-10-CM

## 2013-05-08 LAB — POCT URINALYSIS DIPSTICK
Spec Grav, UA: 1.01
pH, UA: 6

## 2013-05-08 MED ORDER — OSELTAMIVIR PHOSPHATE 75 MG PO CAPS: 75.0000 mg | ORAL_CAPSULE | Freq: Two times a day (BID) | ORAL | Status: DC

## 2013-05-08 NOTE — Progress Notes (Signed)
Pulse- 120 CBGs elevated.  URI symptoms--?flu-like symptoms.  CBGs elevated.

## 2013-05-08 NOTE — Patient Instructions (Signed)
Influenza Facts Flu (influenza) is a contagious respiratory illness caused by the influenza viruses. It can cause mild to severe illness. While most healthy people recover from the flu without specific treatment and without complications, older people, young children, and people with certain health conditions are at higher risk for serious complications from the flu, including death. CAUSES   The flu virus is spread from person to person by respiratory droplets from coughing and sneezing.  A person can also become infected by touching an object or surface with a virus on it and then touching their mouth, eye or nose.  Adults may be able to infect others from 1 day before symptoms occur and up to 7 days after getting sick. So it is possible to give someone the flu even before you know you are sick and continue to infect others while you are sick. SYMPTOMS   Fever (usually high).  Headache.  Tiredness (can be extreme).  Cough.  Sore throat.  Runny or stuffy nose.  Body aches.  Diarrhea and vomiting may also occur, particularly in children.  These symptoms are referred to as "flu-like symptoms". A lot of different illnesses, including the common cold, can have similar symptoms. DIAGNOSIS   There are tests that can determine if you have the flu as long you are tested within the first 2 or 3 days of illness.  A doctor's exam and additional tests may be needed to identify if you have a disease that is a complicating the flu. RISKS AND COMPLICATIONS  Some of the complications caused by the flu include:  Bacterial pneumonia or progressive pneumonia caused by the flu virus.  Loss of body fluids (dehydration).  Worsening of chronic medical conditions, such as heart failure, asthma, or diabetes.  Sinus problems and ear infections. HOME CARE INSTRUCTIONS   Seek medical care early on.  If you are at high risk from complications of the flu, consult your health-care provider as soon  as you develop flu-like symptoms. Those at high risk for complications include:  People 65 years or older.  People with chronic medical conditions, including diabetes.  Pregnant women.  Young children.  Your caregiver may recommend use of an antiviral medication to help treat the flu.  If you get the flu, get plenty of rest, drink a lot of liquids, and avoid using alcohol and tobacco.  You can take over-the-counter medications to relieve the symptoms of the flu if your caregiver approves. (Never give aspirin to children or teenagers who have flu-like symptoms, particularly fever). PREVENTION  The single best way to prevent the flu is to get a flu vaccine each fall. Other measures that can help protect against the flu are:  Antiviral Medications  A number of antiviral drugs are approved for use in preventing the flu. These are prescription medications, and a doctor should be consulted before they are used.  Habits for Good Health  Cover your nose and mouth with a tissue when you cough or sneeze, throw the tissue away after you use it.  Wash your hands often with soap and water, especially after you cough or sneeze. If you are not near water, use an alcohol-based hand cleaner.  Avoid people who are sick.  If you get the flu, stay home from work or school. Avoid contact with other people so that you do not make them sick, too.  Try not to touch your eyes, nose, or mouth as germs ore often spread this way. IN CHILDREN, EMERGENCY WARNING SIGNS  THAT NEED URGENT MEDICAL ATTENTION:  Fast breathing or trouble breathing.  Bluish skin color.  Not drinking enough fluids.  Not waking up or not interacting.  Being so irritable that the child does not want to be held.  Flu-like symptoms improve but then return with fever and worse cough.  Fever with a rash. IN ADULTS, EMERGENCY WARNING SIGNS THAT NEED URGENT MEDICAL ATTENTION:  Difficulty breathing or shortness of breath.  Pain  or pressure in the chest or abdomen.  Sudden dizziness.  Confusion.  Severe or persistent vomiting. SEEK IMMEDIATE MEDICAL CARE IF:  You or someone you know is experiencing any of the symptoms above. When you arrive at the emergency center,report that you think you have the flu. You may be asked to wear a mask and/or sit in a secluded area to protect others from getting sick. MAKE SURE YOU:   Understand these instructions.  Monitor your condition.  Seek medical care if you are getting worse, or not improving. Document Released: 07/23/2003 Document Revised: 10/12/2011 Document Reviewed: 04/18/2009 [facility] Patient Information 2014 [place], [place]. Fetal Movement Counts Patient Name: __________________________________________________ Patient Due Date: ____________________ Performing a fetal movement count is highly recommended in high-risk pregnancies, but it is good for every pregnant woman to do. Your caregiver may ask you to start counting fetal movements at 28 weeks of the pregnancy. Fetal movements often increase:  After eating a full meal.  After physical activity.  After eating or drinking something sweet or cold.  At rest. Pay attention to when you feel the baby is most active. This will help you notice a pattern of your baby's sleep and wake cycles and what factors contribute to an increase in fetal movement. It is important to perform a fetal movement count at the same time each day when your baby is normally most active.  HOW TO COUNT FETAL MOVEMENTS 1. Find a quiet and comfortable area to sit or lie down on your left side. Lying on your left side provides the best blood and oxygen circulation to your baby. 2. Write down the day and time on a sheet of paper or in a journal. 3. Start counting kicks, flutters, swishes, rolls, or jabs in a 2 hour period. You should feel at least 10 movements within 2 hours. 4. If you do not feel 10 movements in 2 hours, wait 2 3 hours and  count again. Look for a change in the pattern or not enough counts in 2 hours. SEEK MEDICAL CARE IF:  You feel less than 10 counts in 2 hours, tried twice.  There is no movement in over an hour.  The pattern is changing or taking longer each day to reach 10 counts in 2 hours.  You feel the baby is not moving as he or she usually does. Date: ____________ Movements: ____________ Start time: ____________ Doreatha Martin time: ____________  Date: ____________ Movements: ____________ Start time: ____________ Doreatha Martin time: ____________ Date: ____________ Movements: ____________ Start time: ____________ Doreatha Martin time: ____________ Date: ____________ Movements: ____________ Start time: ____________ Doreatha Martin time: ____________ Date: ____________ Movements: ____________ Start time: ____________ Doreatha Martin time: ____________ Date: ____________ Movements: ____________ Start time: ____________ Doreatha Martin time: ____________ Date: ____________ Movements: ____________ Start time: ____________ Doreatha Martin time: ____________ Date: ____________ Movements: ____________ Start time: ____________ Doreatha Martin time: ____________  Date: ____________ Movements: ____________ Start time: ____________ Doreatha Martin time: ____________ Date: ____________ Movements: ____________ Start time: ____________ Doreatha Martin time: ____________ Date: ____________ Movements: ____________ Start time: ____________ Doreatha Martin time: ____________ Date: ____________ Movements: ____________ Start time: ____________  Finish time: ____________ Date: ____________ Movements: ____________ Start time: ____________ Doreatha Martin time: ____________ Date: ____________ Movements: ____________ Start time: ____________ Doreatha Martin time: ____________ Date: ____________ Movements: ____________ Start time: ____________ Doreatha Martin time: ____________  Date: ____________ Movements: ____________ Start time: ____________ Doreatha Martin time: ____________ Date: ____________ Movements: ____________ Start time: ____________ Doreatha Martin  time: ____________ Date: ____________ Movements: ____________ Start time: ____________ Doreatha Martin time: ____________ Date: ____________ Movements: ____________ Start time: ____________ Doreatha Martin time: ____________ Date: ____________ Movements: ____________ Start time: ____________ Doreatha Martin time: ____________ Date: ____________ Movements: ____________ Start time: ____________ Doreatha Martin time: ____________ Date: ____________ Movements: ____________ Start time: ____________ Doreatha Martin time: ____________  Date: ____________ Movements: ____________ Start time: ____________ Doreatha Martin time: ____________ Date: ____________ Movements: ____________ Start time: ____________ Doreatha Martin time: ____________ Date: ____________ Movements: ____________ Start time: ____________ Doreatha Martin time: ____________ Date: ____________ Movements: ____________ Start time: ____________ Doreatha Martin time: ____________ Date: ____________ Movements: ____________ Start time: ____________ Doreatha Martin time: ____________ Date: ____________ Movements: ____________ Start time: ____________ Doreatha Martin time: ____________ Date: ____________ Movements: ____________ Start time: ____________ Doreatha Martin time: ____________  Date: ____________ Movements: ____________ Start time: ____________ Doreatha Martin time: ____________ Date: ____________ Movements: ____________ Start time: ____________ Doreatha Martin time: ____________ Date: ____________ Movements: ____________ Start time: ____________ Doreatha Martin time: ____________ Date: ____________ Movements: ____________ Start time: ____________ Doreatha Martin time: ____________ Date: ____________ Movements: ____________ Start time: ____________ Doreatha Martin time: ____________ Date: ____________ Movements: ____________ Start time: ____________ Doreatha Martin time: ____________ Date: ____________ Movements: ____________ Start time: ____________ Doreatha Martin time: ____________  Date: ____________ Movements: ____________ Start time: ____________ Doreatha Martin time: ____________ Date: ____________  Movements: ____________ Start time: ____________ Doreatha Martin time: ____________ Date: ____________ Movements: ____________ Start time: ____________ Doreatha Martin time: ____________ Date: ____________ Movements: ____________ Start time: ____________ Doreatha Martin time: ____________ Date: ____________ Movements: ____________ Start time: ____________ Doreatha Martin time: ____________ Date: ____________ Movements: ____________ Start time: ____________ Doreatha Martin time: ____________ Date: ____________ Movements: ____________ Start time: ____________ Doreatha Martin time: ____________  Date: ____________ Movements: ____________ Start time: ____________ Doreatha Martin time: ____________ Date: ____________ Movements: ____________ Start time: ____________ Doreatha Martin time: ____________ Date: ____________ Movements: ____________ Start time: ____________ Doreatha Martin time: ____________ Date: ____________ Movements: ____________ Start time: ____________ Doreatha Martin time: ____________ Date: ____________ Movements: ____________ Start time: ____________ Doreatha Martin time: ____________ Date: ____________ Movements: ____________ Start time: ____________ Doreatha Martin time: ____________ Date: ____________ Movements: ____________ Start time: ____________ Doreatha Martin time: ____________  Date: ____________ Movements: ____________ Start time: ____________ Doreatha Martin time: ____________ Date: ____________ Movements: ____________ Start time: ____________ Doreatha Martin time: ____________ Date: ____________ Movements: ____________ Start time: ____________ Doreatha Martin time: ____________ Date: ____________ Movements: ____________ Start time: ____________ Doreatha Martin time: ____________ Date: ____________ Movements: ____________ Start time: ____________ Doreatha Martin time: ____________ Date: ____________ Movements: ____________ Start time: ____________ Doreatha Martin time: ____________ Document Released: 08/19/2006 Document Revised: 07/06/2012 Document Reviewed: 05/16/2012 [employer] Patient Information 2014 [place] [employer].

## 2013-05-09 ENCOUNTER — Institutional Professional Consult (permissible substitution): Payer: PRIVATE HEALTH INSURANCE

## 2013-05-10 ENCOUNTER — Encounter: Payer: Self-pay | Admitting: Obstetrics & Gynecology

## 2013-05-10 ENCOUNTER — Other Ambulatory Visit: Payer: Self-pay | Admitting: Obstetrics & Gynecology

## 2013-05-10 ENCOUNTER — Ambulatory Visit (HOSPITAL_COMMUNITY)
Admission: RE | Admit: 2013-05-10 | Discharge: 2013-05-10 | Disposition: A | Discharge status: Home / Self Care | Payer: Medicaid Other | Source: Ambulatory Visit | Attending: Obstetrics & Gynecology | Admitting: Obstetrics & Gynecology

## 2013-05-10 DIAGNOSIS — Z363 Encounter for antenatal screening for malformations: Secondary | ICD-10-CM | POA: Insufficient documentation

## 2013-05-10 DIAGNOSIS — O10019 Pre-existing essential hypertension complicating pregnancy, unspecified trimester: Secondary | ICD-10-CM | POA: Insufficient documentation

## 2013-05-10 DIAGNOSIS — Z1389 Encounter for screening for other disorder: Secondary | ICD-10-CM | POA: Insufficient documentation

## 2013-05-10 DIAGNOSIS — E669 Obesity, unspecified: Secondary | ICD-10-CM | POA: Insufficient documentation

## 2013-05-10 DIAGNOSIS — A6000 Herpesviral infection of urogenital system, unspecified: Secondary | ICD-10-CM | POA: Insufficient documentation

## 2013-05-10 DIAGNOSIS — E119 Type 2 diabetes mellitus without complications: Secondary | ICD-10-CM

## 2013-05-10 DIAGNOSIS — O09529 Supervision of elderly multigravida, unspecified trimester: Secondary | ICD-10-CM | POA: Insufficient documentation

## 2013-05-10 DIAGNOSIS — O24919 Unspecified diabetes mellitus in pregnancy, unspecified trimester: Secondary | ICD-10-CM | POA: Insufficient documentation

## 2013-05-10 DIAGNOSIS — O98519 Other viral diseases complicating pregnancy, unspecified trimester: Secondary | ICD-10-CM | POA: Insufficient documentation

## 2013-05-10 DIAGNOSIS — O358XX0 Maternal care for other (suspected) fetal abnormality and damage, not applicable or unspecified: Secondary | ICD-10-CM | POA: Insufficient documentation

## 2013-05-11 ENCOUNTER — Encounter: Payer: Self-pay | Admitting: Obstetrics & Gynecology

## 2013-05-11 ENCOUNTER — Other Ambulatory Visit: Payer: PRIVATE HEALTH INSURANCE

## 2013-05-11 ENCOUNTER — Ambulatory Visit (HOSPITAL_COMMUNITY)
Admission: RE | Admit: 2013-05-11 | Discharge: 2013-05-11 | Disposition: A | Discharge status: Home / Self Care | Payer: Medicaid Other | Source: Ambulatory Visit | Attending: Obstetrics & Gynecology | Admitting: Obstetrics & Gynecology

## 2013-05-11 ENCOUNTER — Ambulatory Visit (INDEPENDENT_AMBULATORY_CARE_PROVIDER_SITE_OTHER): Payer: Medicaid Other | Admitting: Obstetrics & Gynecology

## 2013-05-11 VITALS — BP 123/80 | Temp 97.6°F | Wt 246.6 lb

## 2013-05-11 DIAGNOSIS — R8290 Unspecified abnormal findings in urine: Secondary | ICD-10-CM

## 2013-05-11 DIAGNOSIS — O0993 Supervision of high risk pregnancy, unspecified, third trimester: Secondary | ICD-10-CM

## 2013-05-11 DIAGNOSIS — J069 Acute upper respiratory infection, unspecified: Secondary | ICD-10-CM | POA: Insufficient documentation

## 2013-05-11 DIAGNOSIS — R0602 Shortness of breath: Secondary | ICD-10-CM | POA: Diagnosis not present

## 2013-05-11 DIAGNOSIS — Z3480 Encounter for supervision of other normal pregnancy, unspecified trimester: Secondary | ICD-10-CM

## 2013-05-11 DIAGNOSIS — R000 Tachycardia, unspecified: Secondary | ICD-10-CM

## 2013-05-11 DIAGNOSIS — Z3483 Encounter for supervision of other normal pregnancy, third trimester: Secondary | ICD-10-CM

## 2013-05-11 DIAGNOSIS — R82998 Other abnormal findings in urine: Secondary | ICD-10-CM

## 2013-05-11 DIAGNOSIS — O0990 Supervision of high risk pregnancy, unspecified, unspecified trimester: Secondary | ICD-10-CM

## 2013-05-11 LAB — POCT URINALYSIS DIPSTICK
Bilirubin, UA: NEGATIVE
Blood, UA: NEGATIVE
Glucose, UA: NEGATIVE
Nitrite, UA: NEGATIVE
Spec Grav, UA: 1.015
Urobilinogen, UA: NEGATIVE
pH, UA: 6

## 2013-05-11 LAB — URINALYSIS, ROUTINE W REFLEX MICROSCOPIC
Bilirubin Urine: NEGATIVE
Glucose, UA: NEGATIVE mg/dL
Hgb urine dipstick: NEGATIVE
Ketones, ur: 15 mg/dL — AB
Leukocytes, UA: NEGATIVE
Nitrite: NEGATIVE
Protein, ur: NEGATIVE mg/dL
Specific Gravity, Urine: 1.02 (ref 1.005–1.030)
Urobilinogen, UA: 0.2 mg/dL (ref 0.0–1.0)
pH: 6.5 (ref 5.0–8.0)

## 2013-05-11 LAB — CBC
HCT: 25.3 % — ABNORMAL LOW (ref 36.0–46.0)
Hemoglobin: 8.5 g/dL — ABNORMAL LOW (ref 12.0–15.0)
MCH: 26.4 pg (ref 26.0–34.0)
MCHC: 33.6 g/dL (ref 30.0–36.0)
MCV: 78.6 fL (ref 78.0–100.0)
Platelets: 285 10*3/uL (ref 150–400)
RBC: 3.22 MIL/uL — ABNORMAL LOW (ref 3.87–5.11)
RDW: 15.6 % — ABNORMAL HIGH (ref 11.5–15.5)
WBC: 8.5 10*3/uL (ref 4.0–10.5)

## 2013-05-11 NOTE — Progress Notes (Signed)
Pulse- 121 Persistent URI symptoms. C/O C/P.  Elevated CBGs.  Lungs CTA B/L.  EKG: sinus tachycardia w/a ventricular rate of 110 bpm.  CXR today.

## 2013-05-12 ENCOUNTER — Encounter: Payer: Self-pay | Admitting: Obstetrics & Gynecology

## 2013-05-14 LAB — CULTURE, OB URINE: Colony Count: 4000

## 2013-05-15 ENCOUNTER — Ambulatory Visit (INDEPENDENT_AMBULATORY_CARE_PROVIDER_SITE_OTHER): Payer: Medicaid Other | Admitting: Obstetrics & Gynecology

## 2013-05-15 ENCOUNTER — Encounter: Payer: Self-pay | Admitting: Obstetrics & Gynecology

## 2013-05-15 ENCOUNTER — Ambulatory Visit: Payer: PRIVATE HEALTH INSURANCE | Admitting: *Deleted

## 2013-05-15 VITALS — BP 121/82 | Temp 99.4°F | Wt 247.0 lb

## 2013-05-15 DIAGNOSIS — D509 Iron deficiency anemia, unspecified: Secondary | ICD-10-CM | POA: Insufficient documentation

## 2013-05-15 DIAGNOSIS — O9989 Other specified diseases and conditions complicating pregnancy, childbirth and the puerperium: Secondary | ICD-10-CM

## 2013-05-15 DIAGNOSIS — R8271 Bacteriuria: Secondary | ICD-10-CM | POA: Insufficient documentation

## 2013-05-15 DIAGNOSIS — Z3483 Encounter for supervision of other normal pregnancy, third trimester: Secondary | ICD-10-CM

## 2013-05-15 DIAGNOSIS — Z3480 Encounter for supervision of other normal pregnancy, unspecified trimester: Secondary | ICD-10-CM

## 2013-05-15 DIAGNOSIS — Z22330 Carrier of Group B streptococcus: Secondary | ICD-10-CM | POA: Insufficient documentation

## 2013-05-15 LAB — POCT URINALYSIS DIPSTICK
Bilirubin, UA: NEGATIVE
Blood, UA: NEGATIVE
Glucose, UA: NEGATIVE
Nitrite, UA: NEGATIVE
Spec Grav, UA: 1.01
Urobilinogen, UA: NEGATIVE
pH, UA: 6.5

## 2013-05-15 NOTE — Progress Notes (Signed)
P 120 Patient reports some pressure. Patient states she has some swelling in her ankles occasionally.

## 2013-05-16 ENCOUNTER — Encounter: Payer: Self-pay | Admitting: Obstetrics & Gynecology

## 2013-05-17 ENCOUNTER — Ambulatory Visit (INDEPENDENT_AMBULATORY_CARE_PROVIDER_SITE_OTHER): Payer: Medicaid Other | Admitting: Psychiatry

## 2013-05-17 ENCOUNTER — Encounter (HOSPITAL_COMMUNITY): Payer: Self-pay | Admitting: Psychiatry

## 2013-05-17 VITALS — BP 124/89 | HR 115 | Ht 65.0 in | Wt 249.2 lb

## 2013-05-17 DIAGNOSIS — F319 Bipolar disorder, unspecified: Secondary | ICD-10-CM

## 2013-05-17 NOTE — Progress Notes (Addendum)
[facility] [id number] Progress Note  Christina [name] [id number] 37 y.o.  05/17/2013 4:05 PM  Chief Complaint:  I want to establish my care.   History of Present Illness:  Patient is a 37 year old African American female who is currently 8 months pregnant, known to this provider from the past wants to restablished her care in this office.  The patient was seen last time in June 2013.  Patient currently not taking any of her psychotropic medication because of pregnancy.  She recently received her Medicaid.  She is not seeing psychiatrist at this time.  However she liked to start psychotropic medications soon after the delivery.  She has expected date of delivery in November.  Patient recently moved from [place] 2 months ago.  Patient moved because of her husbands job and business.  Currently she is living with her in-laws and having a lot of difficulty adjusting with them.  Patient admitted some time irritability, anger, poor sleep, crying spells however she denies any active or passive suicidal thoughts or homicidal thoughts.  She is going to prenatal care regularly.  She denies any hallucinations or any paranoia however she feels sometimes very overwhelmed and stressed.  This is her second pregnancy.  She has a good support from her husband.  Patient is interested in seeing a therapist at this time since she is aware that she cannot start any psychotropic medication until she delivered the baby.  We discussed postpartum depression the patient is aware and like to start psychotropic medications soon after the delivery.  Patient is not drinking or using any illegal substance.  Suicidal Ideation: No Plan Formed: No Patient has means to carry out plan: No  Homicidal Ideation: No Plan Formed: No Patient has means to carry out plan: No  Medical History; Patient has history of diabetes , hypertension, gastroparesis, goiter and GERD  Family History; Patient endorse her father has some history  of psychiatric illness however she did not provide much detail.   Education and Work History; Patient has a college education and currently unemployed.  Psychosocial History; Patient was born and raised in [place].  She moved to [facility] few years ago and recently moved back and stay with her in-laws.  Legal History; Denies  History Of Abuse; Denies  Substance Abuse History; Denies  Past Psychiatric History/Hospitalization(s) Patient has been seeing in this office in 2006. She was referred from her primary care physician Dr. Lodema Hong. She has history of depression and mania started with psychosis. In 1997 she was admitted at [facility] because of suicidal gesture.  She hit her leg with a piece of wood.  She was given Prozac however she stopped taking it when she get better.  She is on Wellbutrin and Abilify for a long time which always worked very well for her.  Patient denies any history of sexual, verbal emotional abuse.    Anxiety: Yes Bipolar Disorder: Yes Depression: Yes Mania: Yes Psychosis: Yes Schizophrenia: No Personality Disorder: No Hospitalization for psychiatric illness: Yes History of Electroconvulsive Shock Therapy: No Prior Suicide Attempts: No   Review of Systems: Psychiatric: Agitation: No Hallucination: No Depressed Mood: Yes Insomnia: Yes Hypersomnia: No Altered Concentration: No Feels Worthless: Yes Grandiose Ideas: No Belief In Special Powers: No New/Increased Substance Abuse: No Compulsions: No  Neurologic: Headache: No Seizure: No Paresthesias: No    Outpatient Encounter Prescriptions as of 05/17/2013  Medication Sig Dispense Refill  . acetaminophen (TYLENOL) 500 MG tablet Take 500 mg by mouth every 6 (six)  hours as needed (headache).      Marland Kitchen aspirin EC 81 MG tablet Take 1 tablet (81 mg total) by mouth daily.  30 tablet  6  . folic acid (FOLVITE) 1 MG tablet Take 1 mg by mouth 3 (three) times daily.      . Insulin Aspart (NOVOLOG FLEXPEN  [place]) Inject 100 Units into the skin 3 (three) times daily. 24breakfast, 10 lunch, 22 dinner      . insulin lispro (HUMALOG) 100 UNIT/ML injection Inject into the skin. Inject under the skin Three (3) times a day as instructed Take 14u sq before breakfast and dinner, and  8u sq before lunch      . insulin NPH (HUMULIN N,NOVOLIN N) 100 UNIT/ML injection Inject into the skin. Inject under the skin daily as instructed. Take 40u sq qam, 26u sq qhs      . loratadine (CLARITIN) 10 MG tablet Take 10 mg by mouth daily.       . metformin (FORTAMET) 1000 MG (OSM) 24 hr tablet Take 1,000 mg by mouth 2 (two) times daily with a meal.      . methyldopa (ALDOMET) 500 MG tablet Take 1 tablet (500 mg total) by mouth 2 (two) times daily.  60 tablet  6  . metoCLOPramide (REGLAN) 5 MG tablet Take 5 mg by mouth daily as needed (diabetic gastropearesis). One tab by mouth 30 mins prior to breakfast, lunch, and dinner      . montelukast (SINGULAIR) 10 MG tablet Take 1 tablet (10 mg total) by mouth at bedtime.  30 tablet  2  . omeprazole (PRILOSEC OTC) 20 MG tablet Take 20 mg by mouth daily. 1 po 30 minutes prior to breakfast      . Prenatal Vit-Fe Fumarate-FA (PRENATAL MULTIVITAMIN) TABS tablet Take 1 tablet by mouth daily at 12 noon.      . [DISCONTINUED] valACYclovir (VALTREX) 500 MG tablet Take 500 mg by mouth 2 (two) times daily.      Marland Kitchen oseltamivir (TAMIFLU) 75 MG capsule Take 1 capsule (75 mg total) by mouth 2 (two) times daily.  10 capsule  0   No facility-administered encounter medications on file as of 05/17/2013.        Physical Exam: Constitutional:  BP 124/89  Pulse 115  Ht 5\' 5"  (1.651 m)  Wt 249 lb 3.2 oz (113.036 kg)  BMI 41.47 kg/m2  LMP 09/17/2012  Recent Results (from the past 2160 hour(s))  POCT URINALYSIS DIPSTICK     Status: None   Collection Time    02/22/13 10:29 AM      Result Value Range   Color, UA YELLOW     Clarity, UA CLEAR     Glucose, UA NEGATIVE     Bilirubin, UA NEGATIVE      Ketones, UA NEGATIVE     Spec Grav, UA 1.020     Blood, UA NEGATIVE     pH, UA 5.0     Protein, UA TRACE     Urobilinogen, UA negative     Nitrite, UA NEGATIVE     Leukocytes, UA Negative    POCT URINALYSIS DIPSTICK     Status: None   Collection Time    03/08/13 10:12 AM      Result Value Range   Color, UA YELLOW     Clarity, UA CLEAR     Glucose, UA NEGATIVE     Bilirubin, UA NEGATIVE     Ketones, UA NEGATIVE     Spec  Grav, UA 1.020     Blood, UA NEGATIVE     pH, UA 5.0     Protein, UA NEGATIVE     Urobilinogen, UA negative     Nitrite, UA NEGATIVE     Leukocytes, UA Negative    HEMOGLOBIN A1C     Status: Abnormal   Collection Time    03/08/13 11:15 AM      Result Value Range   Hemoglobin A1C 6.4 (*) <5.7 %   Comment:                                                                            According to the ADA Clinical Practice Recommendations for 2011, when     HbA1c is used as a screening test:             >=6.5%   Diagnostic of Diabetes Mellitus                (if abnormal result is confirmed)           5.7-6.4%   Increased risk of developing Diabetes Mellitus           References:Diagnosis and Classification of Diabetes Mellitus,Diabetes     Care,2011,34(Suppl 1):S62-S69 and Standards of Medical Care in             Diabetes - 2011,Diabetes Care,2011,34 (Suppl 1):S11-S61.         Mean Plasma Glucose 137 (*) <117 mg/dL  US OB DETAIL + 14 WK     Status: None   Collection Time    03/08/13  4:17 PM      Result Value Range   Biparietal Diameter       Abdominal Circumference       Femoral Diameter       Head Circumference       HC/AC       Estimated Fetal Weight      POCT URINALYSIS DIPSTICK     Status: None   Collection Time    03/15/13 10:16 AM      Result Value Range   Color, UA YELLOW     Clarity, UA CLEAR     Glucose, UA NEGATIVE     Bilirubin, UA NEGATIVE     Ketones, UA 2+     Spec Grav, UA 1.020     Blood, UA NEGATIVE     pH, UA 5.0      Protein, UA TRACE     Urobilinogen, UA negative     Nitrite, UA NEGATIVE     Leukocytes, UA Negative    URINALYSIS, ROUTINE W REFLEX MICROSCOPIC     Status: Abnormal   Collection Time    03/18/13  5:15 PM      Result Value Range   Color, Urine YELLOW  YELLOW   APPearance CLEAR  CLEAR   Specific Gravity, Urine >1.030 (*) 1.005 - 1.030   pH 6.0  5.0 - 8.0   Glucose, UA NEGATIVE  NEGATIVE mg/dL   Hgb urine dipstick NEGATIVE  NEGATIVE   Bilirubin Urine NEGATIVE  NEGATIVE   Ketones, ur 15 (*) NEGATIVE mg/dL   Protein, ur NEGATIVE  NEGATIVE mg/dL   Urobilinogen, UA 1.0  0.0 - 1.0 mg/dL   Nitrite NEGATIVE  NEGATIVE   Leukocytes, UA NEGATIVE  NEGATIVE   Comment: MICROSCOPIC NOT DONE ON URINES WITH NEGATIVE PROTEIN, BLOOD, LEUKOCYTES, NITRITE, OR GLUCOSE <1000 mg/dL.  WET PREP, GENITAL     Status: Abnormal   Collection Time    03/18/13  5:50 PM      Result Value Range   Yeast Wet Prep HPF POC NONE SEEN  NONE SEEN   Trich, Wet Prep NONE SEEN  NONE SEEN   Clue Cells Wet Prep HPF POC NONE SEEN  NONE SEEN   WBC, Wet Prep HPF POC MANY (*) NONE SEEN   Comment: MANY BACTERIA SEEN  POCT URINALYSIS DIPSTICK     Status: None   Collection Time    03/29/13  5:25 PM      Result Value Range   Color, UA       Clarity, UA       Glucose, UA neg     Bilirubin, UA neg     Ketones, UA neg     Spec Grav, UA 1.010     Blood, UA neg     pH, UA 6.0     Protein, UA neg     Urobilinogen, UA negative     Nitrite, UA neg     Leukocytes, UA Negative    URINALYSIS, ROUTINE W REFLEX MICROSCOPIC     Status: None   Collection Time    04/02/13 10:25 PM      Result Value Range   Color, Urine YELLOW  YELLOW   APPearance CLEAR  CLEAR   Specific Gravity, Urine 1.010  1.005 - 1.030   pH 6.0  5.0 - 8.0   Glucose, UA NEGATIVE  NEGATIVE mg/dL   Hgb urine dipstick NEGATIVE  NEGATIVE   Bilirubin Urine NEGATIVE  NEGATIVE   Ketones, ur NEGATIVE  NEGATIVE mg/dL   Protein, ur NEGATIVE  NEGATIVE mg/dL    Urobilinogen, UA 0.2  0.0 - 1.0 mg/dL   Nitrite NEGATIVE  NEGATIVE   Leukocytes, UA NEGATIVE  NEGATIVE   Comment: MICROSCOPIC NOT DONE ON URINES WITH NEGATIVE PROTEIN, BLOOD, LEUKOCYTES, NITRITE, OR GLUCOSE <1000 mg/dL.  WET PREP, GENITAL     Status: Abnormal   Collection Time    04/02/13 11:25 PM      Result Value Range   Yeast Wet Prep HPF POC NONE SEEN  NONE SEEN   Trich, Wet Prep NONE SEEN  NONE SEEN   Clue Cells Wet Prep HPF POC NONE SEEN  NONE SEEN   WBC, Wet Prep HPF POC TOO NUMEROUS TO COUNT (*) NONE SEEN   Comment: MANY BACTERIA SEEN  POCT URINALYSIS DIPSTICK     Status: None   Collection Time    04/05/13  9:46 AM      Result Value Range   Color, UA YELLOW     Clarity, UA CLEAR     Glucose, UA NEGATIVE     Bilirubin, UA NEGATIVE     Ketones, UA 2+     Spec Grav, UA 1.015     Blood, UA NEGATIVE     pH, UA 6.0     Protein, UA NEGATIVE     Urobilinogen, UA negative     Nitrite, UA NEGATIVE     Leukocytes, UA moderate (2+)    GC/CHLAMYDIA PROBE AMP     Status: None   Collection Time    04/05/13  1:22 PM      Result Value  Range   CT Probe RNA NEGATIVE     GC Probe RNA NEGATIVE     Comment:                                                                                            Normal Reference Range: Negative                 Assay performed using the Gen-Probe APTIMA COMBO2 (R) Assay.           Acceptable specimen types for this assay include APTIMA Swabs (Unisex,     endocervical, urethral, or vaginal), first void urine, and ThinPrep     liquid based cytology samples.  CULTURE, OB URINE     Status: None   Collection Time    04/05/13  1:22 PM      Result Value Range   Colony Count NO GROWTH     Organism ID, Bacteria NO GROWTH    POCT URINALYSIS DIPSTICK     Status: None   Collection Time    04/12/13 11:01 AM      Result Value Range   Color, UA YELLOW     Clarity, UA CLEAR     Glucose, UA NEGATIVE     Bilirubin, UA NEGATIVE     Ketones, UA 3+     Spec  Grav, UA 1.025     Blood, UA NEGATIVE     pH, UA 5.0     Protein, UA TRACE     Urobilinogen, UA negative     Nitrite, UA NEGATIVE     Leukocytes, UA Trace    US OB DETAIL + 14 WK     Status: None   Collection Time    04/12/13 12:02 PM      Result Value Range   Biparietal Diameter       Abdominal Circumference       Femoral Diameter       Head Circumference       HC/AC       Estimated Fetal Weight      US OB DETAIL + 14 WK     Status: None   Collection Time    04/13/13  3:57 PM      Result Value Range   Biparietal Diameter       Abdominal Circumference       Femoral Diameter       Head Circumference       HC/AC       Estimated Fetal Weight      POCT URINALYSIS DIPSTICK     Status: None   Collection Time    04/19/13 10:43 AM      Result Value Range   Color, UA YELLOW     Clarity, UA CLEAR     Glucose, UA TRACE     Bilirubin, UA NEGATIVE     Ketones, UA 3+     Spec Grav, UA 1.020     Blood, UA NEGATIVE     pH, UA 5.0     Protein, UA TRACE     Urobilinogen, UA negative  Nitrite, UA NEGATIVE     Leukocytes, UA Negative    POCT URINALYSIS DIPSTICK     Status: None   Collection Time    04/26/13 12:57 PM      Result Value Range   Color, UA YELLOW     Clarity, UA HAZY     Glucose, UA NEGATIVE     Bilirubin, UA NEGATIVE     Ketones, UA 1+     Spec Grav, UA 1.015     Blood, UA       pH, UA 5.0     Protein, UA 1+     Urobilinogen, UA negative     Nitrite, UA NEGATIVE     Leukocytes, UA Negative    GLUCOSE, POCT (MANUAL RESULT ENTRY)     Status: Abnormal   Collection Time    04/26/13 12:59 PM      Result Value Range   POC Glucose 100 (*) 70 - 99 mg/dl  POCT URINALYSIS DIPSTICK     Status: None   Collection Time    05/04/13  2:10 PM      Result Value Range   Color, UA YELLOW     Clarity, UA CLEAR     Glucose, UA NEGATIVE     Bilirubin, UA NEGATIVE     Ketones, UA NEGATIVE     Spec Grav, UA 1.020     Blood, UA NEGATIVE     pH, UA 5.0     Protein, UA  TRACE     Urobilinogen, UA negative     Nitrite, UA NEGATIVE     Leukocytes, UA Trace    POCT URINALYSIS DIPSTICK     Status: None   Collection Time    05/08/13 10:19 AM      Result Value Range   Color, UA       Clarity, UA       Glucose, UA       Bilirubin, UA       Ketones, UA trace     Spec Grav, UA 1.010     Blood, UA       pH, UA 6.0     Protein, UA       Urobilinogen, UA       Nitrite, UA       Leukocytes, UA Trace    POCT URINALYSIS DIPSTICK     Status: None   Collection Time    05/11/13  9:45 AM      Result Value Range   Color, UA YELLOW     Clarity, UA CLEAR     Glucose, UA NEGATIVE     Bilirubin, UA NEGATIVE     Ketones, UA 1+     Spec Grav, UA 1.015     Blood, UA NEGATIVE     pH, UA 6.0     Protein, UA TRACE     Urobilinogen, UA negative     Nitrite, UA NEGATIVE     Leukocytes, UA Trace    CULTURE, OB URINE     Status: None   Collection Time    05/11/13 11:18 AM      Result Value Range   Colony Count 4,000 COLONIES/ML     Organism ID, Bacteria GROUP B STREP (S.AGALACTIAE) ISOLATED     Comment: Testing against S. agalactiae not routinely     performed due to predictability of     AMP/PEN/VAN susceptibility.  URINALYSIS, ROUTINE W REFLEX MICROSCOPIC     Status: Abnormal  Collection Time    05/11/13 11:18 AM      Result Value Range   Color, Urine YELLOW  YELLOW   APPearance CLEAR  CLEAR   Specific Gravity, Urine 1.020  1.005 - 1.030   pH 6.5  5.0 - 8.0   Glucose, UA NEG  NEG mg/dL   Bilirubin Urine NEG  NEG   Ketones, ur 15 (*) NEG mg/dL   Hgb urine dipstick NEG  NEG   Protein, ur NEG  NEG mg/dL   Urobilinogen, UA 0.2  0.0 - 1.0 mg/dL   Nitrite NEG  NEG   Leukocytes, UA NEG  NEG  CBC     Status: Abnormal   Collection Time    05/11/13 11:47 AM      Result Value Range   WBC 8.5  4.0 - 10.5 K/uL   RBC 3.22 (*) 3.87 - 5.11 MIL/uL   Hemoglobin 8.5 (*) 12.0 - 15.0 g/dL   HCT 16.1 (*) 09.6 - 04.5 %   MCV 78.6  78.0 - 100.0 fL   MCH 26.4  26.0 -  34.0 pg   MCHC 33.6  30.0 - 36.0 g/dL   RDW 40.9 (*) 81.1 - 91.4 %   Platelets 285  150 - 400 K/uL  POCT URINALYSIS DIPSTICK     Status: None   Collection Time    05/15/13 10:01 AM      Result Value Range   Color, UA YELLOW     Clarity, UA CLEAR     Glucose, UA NEGATIVE     Bilirubin, UA NEGATIVE     Ketones, UA 2+     Spec Grav, UA 1.010     Blood, UA NEGATIVE     pH, UA 6.5     Protein, UA TRACE     Urobilinogen, UA negative     Nitrite, UA NEGATIVE     Leukocytes, UA moderate (2+)    RETICULOCYTES     Status: Abnormal   Collection Time    05/18/13 10:53 AM      Result Value Range   Retic Ct Pct 2.1  0.4 - 2.3 %   RBC. 3.42 (*) 3.87 - 5.11 MIL/uL   ABS Retic 71.8  19.0 - 186.0 K/uL    Musculoskeletal: Strength & Muscle Tone: within normal limits Gait & Station: normal Patient leans: Front  Mental Status Examination;  Patient is a middle-aged female who is casually dressed and fairly groomed.  She is obese and ovary due to pregnancy.  Her speech is fast but clear and coherent.  Her thought processes logical and goal directed.  She described her mood as sad depressed and anxious and her affect is constricted.  She denies any active or passive suicidal thoughts or homicidal thoughts.  There were no paranoia or delusions present at this time.  Her fund of knowledge is adequate.  She is alert and oriented x3.  Attention and concentration is fair.  Her insight judgment and impulse control is okay.   Medical Decision Making (Choose Three): Established Problem, Stable/Improving (1), Review of Psycho-Social Stressors (1), Review or order clinical lab tests (1), Review and summation of old records (2) and Review of Last Therapy Session (1)  Assessment: Axis I: Bipolar disorder NOS  Axis II: Deferred  Axis III:  Past Medical History  Diagnosis Date  . Hypertension   . Diabetes mellitus   . GERD (gastroesophageal reflux disease)   . Bipolar disorder   . Gastroparesis   .  Genital HSV     Axis IV: Mild   Plan:  I review her symptoms, history, current medication and past records.  At this time patient is not taking any psychotropic medication however she like to restart her medication was she delivered the baby.  She is going to her OB/GYN regularly for her checkups.  She is expecting to deliver baby in November.  I recommend to see therapist for now for coping and social skills.  We will start psychotropic medication once she delivered the baby.  Discussed in detail the risks and benefits of medication and postpartum depression this patient is at risk.  Discussed safety plan and recommend to call us back if she has any question consent after crossing the placenta.  Time spent 45 minutes.  Followup in 4 weeks  Montravious Weigelt T., MD 05/17/2013

## 2013-05-18 ENCOUNTER — Ambulatory Visit (INDEPENDENT_AMBULATORY_CARE_PROVIDER_SITE_OTHER): Payer: Medicaid Other | Admitting: Obstetrics & Gynecology

## 2013-05-18 ENCOUNTER — Encounter: Payer: Self-pay | Admitting: Obstetrics & Gynecology

## 2013-05-18 ENCOUNTER — Ambulatory Visit: Payer: PRIVATE HEALTH INSURANCE

## 2013-05-18 VITALS — BP 111/78 | Temp 98.8°F | Wt 249.4 lb

## 2013-05-18 DIAGNOSIS — F319 Bipolar disorder, unspecified: Secondary | ICD-10-CM

## 2013-05-18 DIAGNOSIS — R82998 Other abnormal findings in urine: Secondary | ICD-10-CM

## 2013-05-18 DIAGNOSIS — O0990 Supervision of high risk pregnancy, unspecified, unspecified trimester: Secondary | ICD-10-CM

## 2013-05-18 DIAGNOSIS — Z3480 Encounter for supervision of other normal pregnancy, unspecified trimester: Secondary | ICD-10-CM

## 2013-05-18 DIAGNOSIS — Z3483 Encounter for supervision of other normal pregnancy, third trimester: Secondary | ICD-10-CM

## 2013-05-18 DIAGNOSIS — R8290 Unspecified abnormal findings in urine: Secondary | ICD-10-CM

## 2013-05-18 LAB — RETICULOCYTES
ABS Retic: 71.8 10*3/uL (ref 19.0–186.0)
RBC.: 3.42 MIL/uL — ABNORMAL LOW (ref 3.87–5.11)
Retic Ct Pct: 2.1 % (ref 0.4–2.3)

## 2013-05-18 MED ORDER — FERROUS SULFATE 325 (65 FE) MG PO TABS: 325.0000 mg | ORAL_TABLET | Freq: Every day | ORAL | Status: DC

## 2013-05-18 MED ORDER — NITROFURANTOIN MONOHYD MACRO 100 MG PO CAPS: 100.0000 mg | ORAL_CAPSULE | Freq: Two times a day (BID) | ORAL | Status: DC

## 2013-05-18 NOTE — Progress Notes (Unsigned)
Pulse- 116.  Little pressure.

## 2013-05-19 LAB — CULTURE, OB URINE
Colony Count: NO GROWTH
Organism ID, Bacteria: NO GROWTH

## 2013-05-23 ENCOUNTER — Other Ambulatory Visit: Payer: Self-pay | Admitting: *Deleted

## 2013-05-23 ENCOUNTER — Ambulatory Visit (INDEPENDENT_AMBULATORY_CARE_PROVIDER_SITE_OTHER): Payer: Medicaid Other | Admitting: Obstetrics & Gynecology

## 2013-05-23 ENCOUNTER — Encounter: Payer: Self-pay | Admitting: *Deleted

## 2013-05-23 VITALS — BP 115/80 | HR 109 | Temp 98.4°F | Wt 254.4 lb

## 2013-05-23 DIAGNOSIS — E119 Type 2 diabetes mellitus without complications: Secondary | ICD-10-CM

## 2013-05-23 DIAGNOSIS — O0990 Supervision of high risk pregnancy, unspecified, unspecified trimester: Secondary | ICD-10-CM

## 2013-05-23 NOTE — Progress Notes (Signed)
Patient seen for MFM consult with [facility] via we chat. She was advised to increase NPH to 46 units in the am and 28 units at bedtime. In addition she is to increase novolg to 28 units at breakfast and  Dinner and 12 units at lunch. Patient was advised to email blood sugar results in 2 days toCindy and follow up with her in 2 weeks. Patient has verbalized understanding and agrees as instructed.

## 2013-05-24 ENCOUNTER — Ambulatory Visit (INDEPENDENT_AMBULATORY_CARE_PROVIDER_SITE_OTHER): Payer: Medicaid Other

## 2013-05-24 ENCOUNTER — Encounter: Payer: Self-pay | Admitting: Obstetrics & Gynecology

## 2013-05-24 DIAGNOSIS — O0990 Supervision of high risk pregnancy, unspecified, unspecified trimester: Secondary | ICD-10-CM

## 2013-05-24 LAB — US OB DETAIL + 14 WK

## 2013-05-25 ENCOUNTER — Other Ambulatory Visit: Payer: Medicaid Other

## 2013-05-25 ENCOUNTER — Ambulatory Visit (INDEPENDENT_AMBULATORY_CARE_PROVIDER_SITE_OTHER): Payer: Medicaid Other | Admitting: Obstetrics & Gynecology

## 2013-05-25 VITALS — BP 137/88 | Temp 97.5°F | Wt 255.0 lb

## 2013-05-25 DIAGNOSIS — Z3483 Encounter for supervision of other normal pregnancy, third trimester: Secondary | ICD-10-CM

## 2013-05-25 DIAGNOSIS — D649 Anemia, unspecified: Secondary | ICD-10-CM

## 2013-05-25 DIAGNOSIS — O4700 False labor before 37 completed weeks of gestation, unspecified trimester: Secondary | ICD-10-CM

## 2013-05-25 DIAGNOSIS — E119 Type 2 diabetes mellitus without complications: Secondary | ICD-10-CM

## 2013-05-25 DIAGNOSIS — Z3480 Encounter for supervision of other normal pregnancy, unspecified trimester: Secondary | ICD-10-CM

## 2013-05-25 LAB — FOLATE: Folate: >20 ng/mL

## 2013-05-25 LAB — POCT URINALYSIS DIPSTICK
Bilirubin, UA: NEGATIVE
Blood, UA: NEGATIVE
Glucose, UA: NEGATIVE
Leukocytes, UA: NEGATIVE
Nitrite, UA: NEGATIVE
Protein, UA: NEGATIVE
Spec Grav, UA: 1.025
Urobilinogen, UA: NEGATIVE
pH, UA: 6

## 2013-05-25 LAB — VITAMIN B12: Vitamin B-12: 316 pg/mL (ref 211–911)

## 2013-05-25 LAB — FERRITIN: Ferritin: 6 ng/mL — ABNORMAL LOW (ref 10–291)

## 2013-05-25 MED ORDER — VALACYCLOVIR HCL 500 MG PO TABS: 500.0000 mg | ORAL_TABLET | Freq: Every day | ORAL | Status: DC

## 2013-05-25 NOTE — Progress Notes (Signed)
Pulse-103 Pt states she is having some irregular contractions. Pt states she is having left lower abdomen pressure. Delivery plan per [facility] MFM: IOL at 39 week.  Glycemic control improved.

## 2013-05-26 ENCOUNTER — Encounter: Payer: Self-pay | Admitting: Obstetrics & Gynecology

## 2013-05-26 LAB — US OB DETAIL + 14 WK

## 2013-05-26 NOTE — Patient Instructions (Signed)
Herpes During and After Pregnancy Genital herpes is a sexually transmitted infection (STI). It is caused by a virus and can be very serious during pregnancy. The greatest concern is passing the virus to the fetus and newborn. The virus is more likely to be passed to the newborn during delivery if the mother becomes infected for the first time late in her pregnancy (primary infection). It is less common for the virus to pass to the newborn if the mother had herpes before becoming pregnant. This is because antibodies against the virus develop over a period of time. These antibodies help protect the baby. Lastly, the infection can pass to the fetus through the placenta. This can happen if the mother gets herpes for the first time in the first 3 months of her pregnancy (first trimester). This can possibly cause a miscarriage or birth defects in the baby. TREATMENT DURING PREGNANCY Medicines may be prescribed that are safe for the mother and the fetus. The medicine can lessen symptoms or prevent a recurrence of the infection. If the infection happened before becoming pregnant, medicine may be prescribed in the last 4 weeks of the pregnancy. This can prevent a recurrent infection at the time of delivery.  RECOMMENDED DELIVERY METHOD If an active, recurrent infection is present at the time delivery, the baby should be delivered by cesarean delivery. This is because the virus can pass to the baby through an infected birth canal. This can cause severe problems for the baby. If the infection happens for the first time late in the pregnancy, the caregiver may also recommend a cesarean delivery. With a new infection, the body has not had the time to build up enough antibodies against the virus to protect the baby from getting the infection. Even if the birth canal does not have visible sores (lesions) from the herpes virus, there is still that chance that the virus can spread to the baby. A cesarean delivery should be  done if there is any signs or feeling of an infection being present in the genital area. Cesarean delivery is not recommended for women with a history of herpes infection but no evidence of active genital lesions at the time of delivery. Lesions that have crusted fully are considered healed and not active. BREASTFEEDING  Women infected with genital herpes can breastfeed their baby. The virus will not be present in the breast milk. If lesions are present on the breast, the baby should not breastfeed from the affected breast(s). HOW TO PREVENT PASSING HERPES TO YOUR BABY AFTER DELIVERY  Wash your hands with soap and water often and before touching your baby.  If you have an outbreak, keep the area clean and covered.  Try to avoid physical and stressful situations that may bring on an outbreak. SEEK IMMEDIATE MEDICAL CARE IF:   You have an outbreak during pregnancy and cannot urinate.  You have an outbreak anytime during your pregnancy and especially in the last 3 months of the pregnancy.  You think you are having an allergic reaction or side effects from the medicine you are taking. Document Released: 10/26/2000 Document Revised: 10/12/2011 Document Reviewed: 06/30/2011 [employer] Patient Information 2014 [employer].  

## 2013-05-29 ENCOUNTER — Ambulatory Visit (INDEPENDENT_AMBULATORY_CARE_PROVIDER_SITE_OTHER): Payer: Medicaid Other | Admitting: Obstetrics

## 2013-05-29 ENCOUNTER — Other Ambulatory Visit: Payer: PRIVATE HEALTH INSURANCE

## 2013-05-29 ENCOUNTER — Encounter: Payer: PRIVATE HEALTH INSURANCE | Admitting: Obstetrics & Gynecology

## 2013-05-29 DIAGNOSIS — E119 Type 2 diabetes mellitus without complications: Secondary | ICD-10-CM

## 2013-05-29 DIAGNOSIS — O0990 Supervision of high risk pregnancy, unspecified, unspecified trimester: Secondary | ICD-10-CM

## 2013-05-29 LAB — POCT URINALYSIS DIPSTICK
Bilirubin, UA: NEGATIVE
Blood, UA: NEGATIVE
Glucose, UA: NEGATIVE
Nitrite, UA: NEGATIVE
Spec Grav, UA: 1.02
Urobilinogen, UA: NEGATIVE
pH, UA: 5

## 2013-05-29 NOTE — Progress Notes (Signed)
Pulse

## 2013-05-30 ENCOUNTER — Ambulatory Visit (HOSPITAL_COMMUNITY): Payer: Self-pay | Admitting: Psychiatry

## 2013-05-31 ENCOUNTER — Encounter: Payer: Self-pay | Admitting: Obstetrics & Gynecology

## 2013-06-01 ENCOUNTER — Ambulatory Visit (INDEPENDENT_AMBULATORY_CARE_PROVIDER_SITE_OTHER): Payer: Medicaid Other | Admitting: Obstetrics & Gynecology

## 2013-06-01 ENCOUNTER — Ambulatory Visit: Payer: PRIVATE HEALTH INSURANCE

## 2013-06-01 ENCOUNTER — Encounter: Payer: Self-pay | Admitting: Obstetrics & Gynecology

## 2013-06-01 VITALS — BP 119/82 | Temp 99.2°F | Wt 256.0 lb

## 2013-06-01 DIAGNOSIS — O4703 False labor before 37 completed weeks of gestation, third trimester: Secondary | ICD-10-CM

## 2013-06-01 DIAGNOSIS — Z3483 Encounter for supervision of other normal pregnancy, third trimester: Secondary | ICD-10-CM

## 2013-06-01 DIAGNOSIS — O479 False labor, unspecified: Secondary | ICD-10-CM

## 2013-06-01 DIAGNOSIS — O0990 Supervision of high risk pregnancy, unspecified, unspecified trimester: Secondary | ICD-10-CM

## 2013-06-01 DIAGNOSIS — E119 Type 2 diabetes mellitus without complications: Secondary | ICD-10-CM

## 2013-06-01 LAB — POCT URINALYSIS DIPSTICK
Spec Grav, UA: 1.02
pH, UA: 6

## 2013-06-01 NOTE — Progress Notes (Unsigned)
P 112 Patient reports she has been up since 2:00am with contractions and feeling very uncomfortable.

## 2013-06-01 NOTE — Progress Notes (Signed)
Some 2 am lows.  Fastings 90s -100s.  Postprandials OK.  Wean aldomet.

## 2013-06-01 NOTE — Patient Instructions (Signed)
Natural Childbirth Natural childbirth is going through labor and delivery without any drugs to relieve pain. You also do not use fetal monitors, have a cesarean delivery, or get a sugical cut to enlarge the vaginal opening (episiotomy). With the help of a birthing professional (midwife), you will direct your own labor and delivery as you choose. Many women chose natural childbirth because they feel more in control and in touch with their labor and delivery. They are also concerned about the medications affecting themselves and the baby. Pregnant women with a high risk pregnancy should not attempt natural childbirth. It is better to deliver the infant in a hospital if an emergency situation arises. Sometimes, the caregiver has to intervene for the health and safety of the mother and infant. TWO TECHNIQUES FOR NATURAL CHILDBIRTH:   The Lamaze method. This method teaches women that having a baby is normal, healthy, and natural. It also teaches the mother to take a neutral position regarding pain medication and anesthesia and to make an informed decision if and when it is right for them.  The [name] method (also called husband coached birth). This method teaches the father to be the birth coach and stresses a natural approach. It also encourages exercise and a balanced diet with good nutrition. The exercises teach relaxation and deep breathing techniques. However, there are also classes to prepare the parents for an emergency situation that may occur. METHODS OF DEALING WITH LABOR PAIN AND DELIVERY:  Meditation.  Yoga.  Hypnosis.  Acupuncture.  Massage.  Changing positions (walking, rocking, showering, leaning on birth balls).  Lying in warm water or a jacuzzi.  Find an activity that keeps your mind off of the labor pain.  Listen to soft music.  Visual imagery (focus on a particular object). BEFORE GOING INTO LABOR  Be sure you and your spouse/partner are in agreement to have natural  childbirth.  Decide if your caregiver or a midwife will deliver your baby.  Decide if you will have your baby in the hospital, birthing center, or at home.  If you have children, make plans to have someone to take care of them when you go to the hospital.  Know the distance and the time it takes to go to the delivery center. Make a dry run to be sure.  Have a bag packed with a night gown, bathrobe, and toiletries ready to take when you go into labor.  Keep phone numbers of your family and friends handy if you need to call someone when you go into labor.  Your spouse or partner should go to all the teaching classes.  Talk with your caregiver about the possibility of a medical emergency and what will happen if that occurs. ADVANTAGES OF NATURAL CHILDBIRTH  You are in control of your labor and delivery.  It is safe.  There are no medications or anesthetics that may affect you and the fetus.  There are no invasive procedures such as an episiotomy.  You and your partner will work together, which can increase your bond.  Meditation, yoga, massage, and breathing exercises can be learned while pregnant and help you when you are in labor and at delivery.  In most delivery centers, the family and friends can be involved in the labor and delivery process. DISADVANTAGES OF NATURAL CHILDBIRTH  You will experience pain during your labor and delivery.  The methods of helping relieve your labor pains may not work for you.  You may feel embarrassed, disappointed, and like a failure   if you decide to change your mind during labor and not have natural childbirth. AFTER THE DELIVERY  You will be very tired.  You will be uncomfortable because of your uterus contracting. You will feel soreness around the vagina.  You may feel cold and shaky.This is a natural reaction.  You will be excited, overwhelmed, accomplished, and proud to be a mother. HOME CARE INSTRUCTIONS   Follow the advice and  instructions of your caregiver.  Follow the instructions of your natural childbirth instructor (Lamaze or [name] Method). Document Released: 07/02/2008 Document Revised: 10/12/2011 Document Reviewed: 07/02/2008 Exit[employer] Patient Information 2014 [employer].  

## 2013-06-05 ENCOUNTER — Ambulatory Visit (INDEPENDENT_AMBULATORY_CARE_PROVIDER_SITE_OTHER): Payer: Medicaid Other | Admitting: Obstetrics & Gynecology

## 2013-06-05 ENCOUNTER — Other Ambulatory Visit: Payer: Medicaid Other

## 2013-06-05 ENCOUNTER — Encounter: Payer: Self-pay | Admitting: Obstetrics & Gynecology

## 2013-06-05 VITALS — BP 132/89 | Temp 99.0°F | Wt 257.0 lb

## 2013-06-05 DIAGNOSIS — Z3480 Encounter for supervision of other normal pregnancy, unspecified trimester: Secondary | ICD-10-CM

## 2013-06-05 DIAGNOSIS — E119 Type 2 diabetes mellitus without complications: Secondary | ICD-10-CM

## 2013-06-05 DIAGNOSIS — O24919 Unspecified diabetes mellitus in pregnancy, unspecified trimester: Secondary | ICD-10-CM

## 2013-06-05 DIAGNOSIS — Z3483 Encounter for supervision of other normal pregnancy, third trimester: Secondary | ICD-10-CM

## 2013-06-05 LAB — POCT URINALYSIS DIPSTICK
Bilirubin, UA: NEGATIVE
Blood, UA: NEGATIVE
Glucose, UA: NEGATIVE
Nitrite, UA: NEGATIVE
Spec Grav, UA: 1.015
Urobilinogen, UA: NEGATIVE
pH, UA: 6

## 2013-06-05 LAB — HEMOGLOBIN A1C
Hgb A1c MFr Bld: 6.9 % — ABNORMAL HIGH (ref <5.7)
Mean Plasma Glucose: 151 mg/dL — ABNORMAL HIGH (ref <117)

## 2013-06-05 NOTE — Patient Instructions (Signed)

## 2013-06-05 NOTE — Progress Notes (Signed)
Manual repeat blood pressure 130/84 left arm-lg cuff.  Continue aldomet BID.  CBGs OK.

## 2013-06-05 NOTE — Progress Notes (Signed)
Pulse- 112 Pt states lower abdomen pain and pressure. Pt states she is also having Pelvic pain.

## 2013-06-06 ENCOUNTER — Ambulatory Visit (INDEPENDENT_AMBULATORY_CARE_PROVIDER_SITE_OTHER): Payer: PRIVATE HEALTH INSURANCE | Admitting: Obstetrics & Gynecology

## 2013-06-06 ENCOUNTER — Encounter: Payer: Self-pay | Admitting: Obstetrics & Gynecology

## 2013-06-06 VITALS — BP 107/75 | HR 111 | Temp 98.5°F | Wt 261.0 lb

## 2013-06-06 DIAGNOSIS — O0990 Supervision of high risk pregnancy, unspecified, unspecified trimester: Secondary | ICD-10-CM

## 2013-06-06 LAB — COMPREHENSIVE METABOLIC PANEL
ALT: 15 U/L (ref 0–35)
AST: 17 U/L (ref 0–37)
Albumin: 3.4 g/dL — ABNORMAL LOW (ref 3.5–5.2)
Alkaline Phosphatase: 155 U/L — ABNORMAL HIGH (ref 39–117)
BUN: 10 mg/dL (ref 6–23)
CO2: 20 mEq/L (ref 19–32)
Calcium: 9.2 mg/dL (ref 8.4–10.5)
Chloride: 103 mEq/L (ref 96–112)
Creat: 0.7 mg/dL (ref 0.50–1.10)
Glucose, Bld: 93 mg/dL (ref 70–99)
Potassium: 4.2 mEq/L (ref 3.5–5.3)
Sodium: 137 mEq/L (ref 135–145)
Total Bilirubin: 0.3 mg/dL (ref 0.3–1.2)
Total Protein: 6.4 g/dL (ref 6.0–8.3)

## 2013-06-07 ENCOUNTER — Other Ambulatory Visit: Payer: Self-pay | Admitting: *Deleted

## 2013-06-07 ENCOUNTER — Encounter: Payer: Self-pay | Admitting: Obstetrics & Gynecology

## 2013-06-07 DIAGNOSIS — J329 Chronic sinusitis, unspecified: Secondary | ICD-10-CM

## 2013-06-07 MED ORDER — AZITHROMYCIN 250 MG PO TABS: 250.0000 mg | ORAL_TABLET | Freq: Every day | ORAL | Status: DC

## 2013-06-08 ENCOUNTER — Ambulatory Visit (INDEPENDENT_AMBULATORY_CARE_PROVIDER_SITE_OTHER): Payer: Medicaid Other | Admitting: Obstetrics & Gynecology

## 2013-06-08 ENCOUNTER — Other Ambulatory Visit: Payer: Self-pay | Admitting: *Deleted

## 2013-06-08 ENCOUNTER — Other Ambulatory Visit: Payer: Medicaid Other

## 2013-06-08 VITALS — BP 138/87 | Temp 98.3°F | Wt 262.9 lb

## 2013-06-08 DIAGNOSIS — R8271 Bacteriuria: Secondary | ICD-10-CM

## 2013-06-08 DIAGNOSIS — O24919 Unspecified diabetes mellitus in pregnancy, unspecified trimester: Secondary | ICD-10-CM

## 2013-06-08 DIAGNOSIS — O0990 Supervision of high risk pregnancy, unspecified, unspecified trimester: Secondary | ICD-10-CM

## 2013-06-08 NOTE — Progress Notes (Signed)
Pulse: 116 

## 2013-06-09 ENCOUNTER — Other Ambulatory Visit: Payer: Medicaid Other

## 2013-06-09 ENCOUNTER — Encounter: Payer: Self-pay | Admitting: Obstetrics & Gynecology

## 2013-06-10 NOTE — Progress Notes (Signed)
See OB visit note

## 2013-06-12 ENCOUNTER — Other Ambulatory Visit: Payer: PRIVATE HEALTH INSURANCE

## 2013-06-12 ENCOUNTER — Inpatient Hospital Stay (HOSPITAL_COMMUNITY)
Admission: AD | Admit: 2013-06-12 | Discharge: 2013-06-13 | Disposition: A | Discharge status: Home / Self Care | Payer: Medicaid Other | Source: Ambulatory Visit | Attending: Obstetrics & Gynecology | Admitting: Obstetrics & Gynecology

## 2013-06-12 ENCOUNTER — Ambulatory Visit (INDEPENDENT_AMBULATORY_CARE_PROVIDER_SITE_OTHER): Payer: Medicaid Other | Admitting: Obstetrics & Gynecology

## 2013-06-12 ENCOUNTER — Encounter (HOSPITAL_COMMUNITY): Payer: Self-pay

## 2013-06-12 DIAGNOSIS — O99891 Other specified diseases and conditions complicating pregnancy: Secondary | ICD-10-CM | POA: Insufficient documentation

## 2013-06-12 DIAGNOSIS — F319 Bipolar disorder, unspecified: Secondary | ICD-10-CM

## 2013-06-12 DIAGNOSIS — A6000 Herpesviral infection of urogenital system, unspecified: Secondary | ICD-10-CM

## 2013-06-12 DIAGNOSIS — O0990 Supervision of high risk pregnancy, unspecified, unspecified trimester: Secondary | ICD-10-CM

## 2013-06-12 DIAGNOSIS — Z22330 Carrier of Group B streptococcus: Secondary | ICD-10-CM

## 2013-06-12 DIAGNOSIS — D509 Iron deficiency anemia, unspecified: Secondary | ICD-10-CM

## 2013-06-12 DIAGNOSIS — Z3483 Encounter for supervision of other normal pregnancy, third trimester: Secondary | ICD-10-CM

## 2013-06-12 DIAGNOSIS — Z3480 Encounter for supervision of other normal pregnancy, unspecified trimester: Secondary | ICD-10-CM

## 2013-06-12 DIAGNOSIS — I10 Essential (primary) hypertension: Secondary | ICD-10-CM

## 2013-06-12 DIAGNOSIS — O24919 Unspecified diabetes mellitus in pregnancy, unspecified trimester: Secondary | ICD-10-CM

## 2013-06-12 DIAGNOSIS — E669 Obesity, unspecified: Secondary | ICD-10-CM

## 2013-06-12 DIAGNOSIS — R8271 Bacteriuria: Secondary | ICD-10-CM

## 2013-06-12 DIAGNOSIS — E119 Type 2 diabetes mellitus without complications: Secondary | ICD-10-CM

## 2013-06-12 LAB — POCT URINALYSIS DIPSTICK
Bilirubin, UA: NEGATIVE
Blood, UA: NEGATIVE
Glucose, UA: NEGATIVE
Ketones, UA: NEGATIVE
Leukocytes, UA: NEGATIVE
Nitrite, UA: NEGATIVE
Protein, UA: NEGATIVE
Spec Grav, UA: 1.02
Urobilinogen, UA: NEGATIVE
pH, UA: 6

## 2013-06-12 LAB — OB RESULTS CONSOLE GBS: GBS: POSITIVE

## 2013-06-12 NOTE — MAU Note (Addendum)
PT SAYS  SROM AT 9PM-   SHE WAS SITTING - FELT WARM GUSH.     THIS AM  IN OFFICE -  NO VE.  IS SCH FOR AN INDUCTION ON 11-16. LAST  OUTBREAK  WITH HSV- MARCH-  IS TAKING  VALTREX-  DENIES ANY S/S OF OUTBREAK NOW.  DENIES MRSA.  IS INSULIN DEP DIABETIC -   DURING PREG

## 2013-06-13 ENCOUNTER — Telehealth (HOSPITAL_COMMUNITY): Payer: Self-pay | Admitting: *Deleted

## 2013-06-13 ENCOUNTER — Encounter (HOSPITAL_COMMUNITY): Payer: Self-pay | Admitting: *Deleted

## 2013-06-13 ENCOUNTER — Encounter: Payer: Self-pay | Admitting: Obstetrics & Gynecology

## 2013-06-13 LAB — AMNISURE RUPTURE OF MEMBRANE (ROM) NOT AT ARMC: Amnisure ROM: NEGATIVE

## 2013-06-13 LAB — POCT FERN TEST: POCT Fern Test: NEGATIVE

## 2013-06-13 NOTE — Telephone Encounter (Signed)
Preadmission screen  

## 2013-06-14 ENCOUNTER — Inpatient Hospital Stay (HOSPITAL_COMMUNITY)
Admission: AD | Admit: 2013-06-14 | Discharge: 2013-06-17 | DRG: 774 | Disposition: A | Discharge status: Home / Self Care | Payer: Medicaid Other | Source: Ambulatory Visit | Attending: Obstetrics & Gynecology | Admitting: Obstetrics & Gynecology

## 2013-06-14 DIAGNOSIS — D649 Anemia, unspecified: Secondary | ICD-10-CM | POA: Diagnosis not present

## 2013-06-14 DIAGNOSIS — F319 Bipolar disorder, unspecified: Secondary | ICD-10-CM

## 2013-06-14 DIAGNOSIS — A6000 Herpesviral infection of urogenital system, unspecified: Secondary | ICD-10-CM

## 2013-06-14 DIAGNOSIS — R8271 Bacteriuria: Secondary | ICD-10-CM

## 2013-06-14 DIAGNOSIS — O139 Gestational [pregnancy-induced] hypertension without significant proteinuria, unspecified trimester: Principal | ICD-10-CM | POA: Diagnosis present

## 2013-06-14 DIAGNOSIS — D509 Iron deficiency anemia, unspecified: Secondary | ICD-10-CM

## 2013-06-14 DIAGNOSIS — O9903 Anemia complicating the puerperium: Secondary | ICD-10-CM | POA: Diagnosis not present

## 2013-06-14 DIAGNOSIS — O0990 Supervision of high risk pregnancy, unspecified, unspecified trimester: Secondary | ICD-10-CM

## 2013-06-14 DIAGNOSIS — O2432 Unspecified pre-existing diabetes mellitus in childbirth: Secondary | ICD-10-CM | POA: Diagnosis present

## 2013-06-14 DIAGNOSIS — O99892 Other specified diseases and conditions complicating childbirth: Secondary | ICD-10-CM | POA: Diagnosis present

## 2013-06-14 DIAGNOSIS — E669 Obesity, unspecified: Secondary | ICD-10-CM

## 2013-06-14 DIAGNOSIS — I10 Essential (primary) hypertension: Secondary | ICD-10-CM

## 2013-06-14 DIAGNOSIS — Z22330 Carrier of Group B streptococcus: Secondary | ICD-10-CM

## 2013-06-14 DIAGNOSIS — E119 Type 2 diabetes mellitus without complications: Secondary | ICD-10-CM

## 2013-06-14 NOTE — MAU Note (Signed)
Pt having contractions every .  Type 2 diabetic, taking metformin and insulin.

## 2013-06-15 ENCOUNTER — Encounter (HOSPITAL_COMMUNITY): Payer: Self-pay | Admitting: *Deleted

## 2013-06-15 ENCOUNTER — Inpatient Hospital Stay (HOSPITAL_COMMUNITY): Payer: Medicaid Other | Admitting: Anesthesiology

## 2013-06-15 ENCOUNTER — Encounter: Payer: PRIVATE HEALTH INSURANCE | Admitting: Obstetrics & Gynecology

## 2013-06-15 ENCOUNTER — Other Ambulatory Visit: Payer: PRIVATE HEALTH INSURANCE

## 2013-06-15 LAB — GLUCOSE, CAPILLARY
Glucose-Capillary: 103 mg/dL — ABNORMAL HIGH (ref 70–99)
Glucose-Capillary: 112 mg/dL — ABNORMAL HIGH (ref 70–99)
Glucose-Capillary: 146 mg/dL — ABNORMAL HIGH (ref 70–99)
Glucose-Capillary: 233 mg/dL — ABNORMAL HIGH (ref 70–99)
Glucose-Capillary: 80 mg/dL (ref 70–99)

## 2013-06-15 LAB — TYPE AND SCREEN
ABO/RH(D): A POS
Antibody Screen: NEGATIVE

## 2013-06-15 LAB — CBC
HCT: 28.4 % — ABNORMAL LOW (ref 36.0–46.0)
Hemoglobin: 9 g/dL — ABNORMAL LOW (ref 12.0–15.0)
MCH: 25.4 pg — ABNORMAL LOW (ref 26.0–34.0)
MCHC: 31.7 g/dL (ref 30.0–36.0)
MCV: 80.2 fL (ref 78.0–100.0)
Platelets: 280 10*3/uL (ref 150–400)
RBC: 3.54 MIL/uL — ABNORMAL LOW (ref 3.87–5.11)
RDW: 18.5 % — ABNORMAL HIGH (ref 11.5–15.5)
WBC: 7.8 10*3/uL (ref 4.0–10.5)

## 2013-06-15 LAB — ABO/RH: ABO/RH(D): A POS

## 2013-06-15 LAB — RPR: RPR Ser Ql: NONREACTIVE

## 2013-06-15 LAB — POCT FERN TEST: POCT Fern Test: POSITIVE

## 2013-06-15 MED ORDER — TERBUTALINE SULFATE 1 MG/ML IJ SOLN: 0.2500 mg | Freq: Once | INTRAMUSCULAR | Status: DC | PRN

## 2013-06-15 MED ORDER — ONDANSETRON HCL 4 MG/2ML IJ SOLN: 4.0000 mg | INTRAMUSCULAR | Status: DC | PRN

## 2013-06-15 MED ORDER — CITRIC ACID-SODIUM CITRATE 334-500 MG/5ML PO SOLN: 30.0000 mL | ORAL | Status: DC | PRN

## 2013-06-15 MED ORDER — FENTANYL 2.5 MCG/ML BUPIVACAINE 1/10 % EPIDURAL INFUSION (WH - ANES): 14.0000 mL/h | INTRAMUSCULAR | Status: DC | PRN

## 2013-06-15 MED ORDER — OXYTOCIN 40 UNITS IN LACTATED RINGERS INFUSION - SIMPLE MED
1.0000 m[IU]/min | INTRAVENOUS | Status: DC
  Administered 2013-06-15: 2 m[IU]/min via INTRAVENOUS
  Filled 2013-06-15: qty 1000

## 2013-06-15 MED ORDER — PRENATAL MULTIVITAMIN CH
1.0000 | ORAL_TABLET | Freq: Every day | ORAL | Status: DC
  Administered 2013-06-16: 1 via ORAL
  Filled 2013-06-15: qty 1

## 2013-06-15 MED ORDER — ONDANSETRON HCL 4 MG PO TABS
4.0000 mg | ORAL_TABLET | ORAL | Status: DC | PRN
Start: 2013-06-15 — End: 2013-06-17

## 2013-06-15 MED ORDER — IBUPROFEN 600 MG PO TABS
600.0000 mg | ORAL_TABLET | Freq: Four times a day (QID) | ORAL | Status: DC
  Administered 2013-06-15 – 2013-06-17 (×7): 600 mg via ORAL
  Filled 2013-06-15 (×7): qty 1

## 2013-06-15 MED ORDER — OXYTOCIN BOLUS FROM INFUSION: 500.0000 mL | INTRAVENOUS | Status: DC

## 2013-06-15 MED ORDER — EPHEDRINE 5 MG/ML INJ
10.0000 mg | INTRAVENOUS | Status: DC | PRN
  Filled 2013-06-15: qty 4

## 2013-06-15 MED ORDER — CLINDAMYCIN PHOSPHATE 900 MG/50ML IV SOLN
900.0000 mg | Freq: Three times a day (TID) | INTRAVENOUS | Status: DC
  Filled 2013-06-15: qty 50

## 2013-06-15 MED ORDER — ONDANSETRON HCL 4 MG/2ML IJ SOLN: 4.0000 mg | Freq: Four times a day (QID) | INTRAMUSCULAR | Status: DC | PRN

## 2013-06-15 MED ORDER — IBUPROFEN 600 MG PO TABS: 600.0000 mg | ORAL_TABLET | Freq: Four times a day (QID) | ORAL | Status: DC | PRN

## 2013-06-15 MED ORDER — BENZOCAINE-MENTHOL 20-0.5 % EX AERO
1.0000 "application " | INHALATION_SPRAY | CUTANEOUS | Status: DC | PRN
  Administered 2013-06-15: 1 via TOPICAL
  Filled 2013-06-15 (×2): qty 56

## 2013-06-15 MED ORDER — SENNOSIDES-DOCUSATE SODIUM 8.6-50 MG PO TABS
2.0000 | ORAL_TABLET | ORAL | Status: DC
  Administered 2013-06-15 – 2013-06-17 (×2): 2 via ORAL
  Filled 2013-06-15 (×2): qty 2

## 2013-06-15 MED ORDER — WITCH HAZEL-GLYCERIN EX PADS: 1.0000 "application " | MEDICATED_PAD | CUTANEOUS | Status: DC | PRN

## 2013-06-15 MED ORDER — MAGNESIUM HYDROXIDE 400 MG/5ML PO SUSP: 30.0000 mL | ORAL | Status: DC | PRN

## 2013-06-15 MED ORDER — OXYTOCIN 40 UNITS IN LACTATED RINGERS INFUSION - SIMPLE MED: 62.5000 mL/h | INTRAVENOUS | Status: DC

## 2013-06-15 MED ORDER — CEFAZOLIN SODIUM 1-5 GM-% IV SOLN
1.0000 g | Freq: Three times a day (TID) | INTRAVENOUS | Status: DC
  Administered 2013-06-15: 1 g via INTRAVENOUS
  Filled 2013-06-15 (×2): qty 50

## 2013-06-15 MED ORDER — HYDROXYZINE HCL 50 MG PO TABS
50.0000 mg | ORAL_TABLET | Freq: Once | ORAL | Status: AC
  Administered 2013-06-15: 50 mg via ORAL
  Filled 2013-06-15: qty 1

## 2013-06-15 MED ORDER — LANOLIN HYDROUS EX OINT: TOPICAL_OINTMENT | CUTANEOUS | Status: DC | PRN

## 2013-06-15 MED ORDER — ACETAMINOPHEN 325 MG PO TABS: 650.0000 mg | ORAL_TABLET | ORAL | Status: DC | PRN

## 2013-06-15 MED ORDER — MISOPROSTOL 25 MCG QUARTER TABLET: 25.0000 ug | ORAL_TABLET | ORAL | Status: DC | PRN

## 2013-06-15 MED ORDER — FENTANYL 2.5 MCG/ML BUPIVACAINE 1/10 % EPIDURAL INFUSION (WH - ANES)
INTRAMUSCULAR | Status: DC | PRN
  Administered 2013-06-15: 14 mL/h via EPIDURAL

## 2013-06-15 MED ORDER — OXYTOCIN 40 UNITS IN LACTATED RINGERS INFUSION - SIMPLE MED: 1.0000 m[IU]/min | INTRAVENOUS | Status: DC

## 2013-06-15 MED ORDER — FERROUS SULFATE 325 (65 FE) MG PO TABS
325.0000 mg | ORAL_TABLET | Freq: Two times a day (BID) | ORAL | Status: DC
  Administered 2013-06-15 – 2013-06-17 (×4): 325 mg via ORAL
  Filled 2013-06-15 (×4): qty 1

## 2013-06-15 MED ORDER — EPHEDRINE 5 MG/ML INJ: 10.0000 mg | INTRAVENOUS | Status: DC | PRN

## 2013-06-15 MED ORDER — TETANUS-DIPHTH-ACELL PERTUSSIS 5-2.5-18.5 LF-MCG/0.5 IM SUSP: 0.5000 mL | Freq: Once | INTRAMUSCULAR | Status: DC

## 2013-06-15 MED ORDER — MEDROXYPROGESTERONE ACETATE 150 MG/ML IM SUSP: 150.0000 mg | INTRAMUSCULAR | Status: DC | PRN

## 2013-06-15 MED ORDER — LACTATED RINGERS IV SOLN: INTRAVENOUS | Status: DC

## 2013-06-15 MED ORDER — DIBUCAINE 1 % RE OINT
1.0000 "application " | TOPICAL_OINTMENT | RECTAL | Status: DC | PRN
  Filled 2013-06-15: qty 28

## 2013-06-15 MED ORDER — DIPHENHYDRAMINE HCL 25 MG PO CAPS: 25.0000 mg | ORAL_CAPSULE | Freq: Four times a day (QID) | ORAL | Status: DC | PRN

## 2013-06-15 MED ORDER — LACTATED RINGERS IV SOLN
INTRAVENOUS | Status: DC
  Administered 2013-06-15 (×2): via INTRAVENOUS

## 2013-06-15 MED ORDER — CEFAZOLIN SODIUM-DEXTROSE 2-3 GM-% IV SOLR
2.0000 g | Freq: Once | INTRAVENOUS | Status: AC
  Administered 2013-06-15: 2 g via INTRAVENOUS
  Filled 2013-06-15: qty 50

## 2013-06-15 MED ORDER — ZOLPIDEM TARTRATE 5 MG PO TABS: 5.0000 mg | ORAL_TABLET | Freq: Every evening | ORAL | Status: DC | PRN

## 2013-06-15 MED ORDER — LIDOCAINE HCL (PF) 1 % IJ SOLN: 30.0000 mL | INTRAMUSCULAR | Status: DC | PRN

## 2013-06-15 MED ORDER — LACTATED RINGERS IV SOLN: 500.0000 mL | Freq: Once | INTRAVENOUS | Status: DC

## 2013-06-15 MED ORDER — BUTORPHANOL TARTRATE 1 MG/ML IJ SOLN: 1.0000 mg | INTRAMUSCULAR | Status: DC | PRN

## 2013-06-15 MED ORDER — MONTELUKAST SODIUM 10 MG PO TABS
10.0000 mg | ORAL_TABLET | Freq: Every day | ORAL | Status: DC
Start: 2013-06-15 — End: 2013-06-17
  Administered 2013-06-15: 10 mg via ORAL
  Filled 2013-06-15 (×2): qty 1

## 2013-06-15 MED ORDER — FENTANYL 2.5 MCG/ML BUPIVACAINE 1/10 % EPIDURAL INFUSION (WH - ANES)
14.0000 mL/h | INTRAMUSCULAR | Status: DC | PRN
  Filled 2013-06-15: qty 125

## 2013-06-15 MED ORDER — OXYCODONE-ACETAMINOPHEN 5-325 MG PO TABS
1.0000 | ORAL_TABLET | ORAL | Status: DC | PRN
  Administered 2013-06-15: 2 via ORAL
  Administered 2013-06-16 – 2013-06-17 (×3): 1 via ORAL
  Filled 2013-06-15: qty 1
  Filled 2013-06-15: qty 2
  Filled 2013-06-15 (×2): qty 1

## 2013-06-15 MED ORDER — HYDROXYZINE HCL 50 MG PO TABS: 50.0000 mg | ORAL_TABLET | Freq: Four times a day (QID) | ORAL | Status: DC | PRN

## 2013-06-15 MED ORDER — FLEET ENEMA 7-19 GM/118ML RE ENEM: 1.0000 | ENEMA | RECTAL | Status: DC | PRN

## 2013-06-15 MED ORDER — METHYLDOPA 500 MG PO TABS
500.0000 mg | ORAL_TABLET | Freq: Two times a day (BID) | ORAL | Status: DC
  Administered 2013-06-15 – 2013-06-17 (×5): 500 mg via ORAL
  Filled 2013-06-15 (×5): qty 1

## 2013-06-15 MED ORDER — LACTATED RINGERS IV SOLN: 500.0000 mL | INTRAVENOUS | Status: DC | PRN

## 2013-06-15 MED ORDER — LORATADINE 10 MG PO TABS
10.0000 mg | ORAL_TABLET | Freq: Every day | ORAL | Status: DC
  Administered 2013-06-15 – 2013-06-17 (×3): 10 mg via ORAL
  Filled 2013-06-15 (×3): qty 1

## 2013-06-15 MED ORDER — OXYCODONE-ACETAMINOPHEN 5-325 MG PO TABS: 1.0000 | ORAL_TABLET | ORAL | Status: DC | PRN

## 2013-06-15 MED ORDER — INSULIN ASPART 100 UNIT/ML ~~LOC~~ SOLN: 0.0000 [IU] | SUBCUTANEOUS | Status: DC

## 2013-06-15 MED ORDER — PHENYLEPHRINE 40 MCG/ML (10ML) SYRINGE FOR IV PUSH (FOR BLOOD PRESSURE SUPPORT)
80.0000 ug | PREFILLED_SYRINGE | INTRAVENOUS | Status: DC | PRN
  Filled 2013-06-15: qty 10

## 2013-06-15 MED ORDER — DIPHENHYDRAMINE HCL 50 MG/ML IJ SOLN: 12.5000 mg | INTRAMUSCULAR | Status: DC | PRN

## 2013-06-15 MED ORDER — MEASLES, MUMPS & RUBELLA VAC ~~LOC~~ INJ
0.5000 mL | INJECTION | Freq: Once | SUBCUTANEOUS | Status: AC
  Administered 2013-06-17: 0.5 mL via SUBCUTANEOUS
  Filled 2013-06-15 (×2): qty 0.5

## 2013-06-15 MED ORDER — PHENYLEPHRINE 40 MCG/ML (10ML) SYRINGE FOR IV PUSH (FOR BLOOD PRESSURE SUPPORT): 80.0000 ug | PREFILLED_SYRINGE | INTRAVENOUS | Status: DC | PRN

## 2013-06-15 MED ORDER — LIDOCAINE HCL (PF) 1 % IJ SOLN
INTRAMUSCULAR | Status: DC | PRN
  Administered 2013-06-15 (×2): 5 mL

## 2013-06-15 MED ORDER — DEXTROSE IN LACTATED RINGERS 5 % IV SOLN
INTRAVENOUS | Status: DC
  Administered 2013-06-15: 08:00:00 via INTRAVENOUS

## 2013-06-15 NOTE — Anesthesia Postprocedure Evaluation (Signed)
  Anesthesia Post-op Note  Patient: Christina [name]  Procedure(s) Performed: * No procedures listed *  Patient Location: Mother/Baby  Anesthesia Type:Epidural  Level of Consciousness: awake, alert  and oriented  Airway and Oxygen Therapy: Patient Spontanous Breathing  Post-op Pain: none  Post-op Assessment: Post-op Vital signs reviewed, Patient's Cardiovascular Status Stable, No headache, No backache, No residual numbness and No residual motor weakness  Post-op Vital Signs: Reviewed and stable  Complications: No apparent anesthesia complications

## 2013-06-15 NOTE — Progress Notes (Signed)
Notified Dr. Tamela Oddi inquiring diet and any diabetic medication for pt. After delivery. Stated No, not today and see how she does and then will adjust as needed.

## 2013-06-15 NOTE — Anesthesia Procedure Notes (Signed)
Epidural Patient location during procedure: OB Start time: 06/15/2013 3:35 AM End time: 06/15/2013 3:55 AM  Staffing Anesthesiologist: Lewie Loron R Performed by: anesthesiologist   Preanesthetic Checklist Completed: patient identified, pre-op evaluation, timeout performed, IV checked, risks and benefits discussed and monitors and equipment checked  Epidural Patient position: sitting Prep: site prepped and draped and DuraPrep Patient monitoring: blood pressure, continuous pulse ox and heart rate Approach: midline Injection technique: LOR air and LOR saline  Needle:  Needle type: Tuohy  Needle gauge: 17 G Needle length: 9 cm Needle insertion depth: 8 cm Catheter type: closed end flexible Catheter size: 19 Gauge Catheter at skin depth: 14 cm Test dose: negative  Assessment Sensory level: T7 Events: blood not aspirated, injection not painful, no injection resistance, negative IV test and no paresthesia  Additional Notes First attempt, LOR without issue, blood into catheter. Reacquire LOR, catheter threaded easily, no blood aspiration.Reason for block:procedure for pain

## 2013-06-15 NOTE — H&P (Addendum)
Christina [name] is a 37 y.o. female presenting for contractions. Maternal Medical History:  Reason for admission: Rupture of membranes and contractions.   Contractions: Onset was 2 days ago.   Frequency: irregular.    Fetal activity: Perceived fetal activity is normal.    Prenatal complications: PIH.   Prenatal Complications - Diabetes: type 2. Diabetes is managed by oral agent (dual therapy).      OB History   Grav Para Term Preterm Abortions TAB SAB Ect Mult Living   2 1  1      1      Past Medical History  Diagnosis Date  . GERD (gastroesophageal reflux disease)   . Bipolar disorder   . Gastroparesis   . 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 Hypertension     chronic on meds  . Diabetes mellitus     type 2 DM, insulin only needed during pregnancy   Past Surgical History  Procedure Laterality Date  . Breast reduction surgery  1994  . Dermoid tumor  2000   Family History: family history includes Asthma in her father; Diabetes in her father and mother; Fibromyalgia in her mother; Hypertension in her father and mother. Social History:  reports that she has never smoked. She has never used smokeless tobacco. She reports that she does not drink alcohol or use illicit drugs.     Review of Systems  Constitutional: Negative for fever.  Eyes: Negative for blurred vision.  Respiratory: Negative for shortness of breath.   Gastrointestinal: Negative for vomiting.  Skin: Negative for rash.  Neurological: Negative for headaches.    Dilation: 3 Effacement (%): 50 Station: -2 Blood pressure 128/79, pulse 105, temperature 98.4 F (36.9 C), temperature source Oral, resp. rate 22, height 5\' 5"  (1.651 m), weight 123.832 kg (273 lb), last menstrual period 09/17/2012. Maternal Exam:  Abdomen: Patient reports no abdominal tenderness. Fetal presentation: vertex  Introitus: not evaluated.   Pelvis: adequate for delivery.   Cervix: Cervix evaluated by digital exam.     Fetal  Exam Fetal Monitor Review: Variability: moderate (6-25 bpm).   Pattern: accelerations present and no decelerations.    Fetal State Assessment: Category I - tracings are normal.     Physical Exam  Constitutional: She appears well-developed.  HENT:  Head: Normocephalic.  Neck: Neck supple. No thyromegaly present.  Cardiovascular: Normal rate and regular rhythm.   Respiratory: Breath sounds normal.  GI: Soft. Bowel sounds are normal.  Skin: No rash noted.    Prenatal labs: ABO, Rh: A/Positive/-- (03/25 0000) Antibody: Negative (03/25 0000) Rubella: Nonimmune (03/25 0000) RPR: Nonreactive (03/25 0000)  HBsAg: Negative (03/25 0000)  HIV: Non-reactive (03/25 0000)  GBS: Positive (11/10 0000)   Assessment/Plan: IUP@ [redacted]w[redacted]d.  SROM.  Prodromal labor.  Category I FHT. Patient Active Problem List   Diagnosis Date Noted  . Iron deficiency anemia 05/15/2013  . 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 Asymptomatic bacteriuria in pregnancy in third trimester 05/15/2013  . Family history of blood disorder 02/28/2013  . 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 Genital herpes 02/22/2013  . Advanced maternal age (AMA) in pregnancy 02/08/2013  . Unspecified high-risk pregnancy 02/08/2013  . GOITER 06/20/2010  . FATIGUE 02/18/2010  . ALLERGY 10/01/2009  . MUSCLE SPASM, LUMBAR REGION 09/16/2009  . CERVICAL MUSCLE STRAIN 09/16/2009  . GASTROPARESIS 09/02/2009  . OTHER DYSPHAGIA 06/03/2009  . GERD 01/27/2009  . DIABETES MELLITUS, TYPE II 09/16/2008  . OBESITY 01/05/2008  .  BIPOLAR DISORDER UNSPECIFIED 01/05/2008  . HYPERTENSION 01/05/2008    Admit Monitor CBGs/insulin coverage Augment labor with low dose Pitocin per protocol Anticipate an SVD  JACKSON-MOORE,Justinian Miano A 06/15/2013, 1:28 AM

## 2013-06-15 NOTE — Progress Notes (Signed)
Up to BR w/stedy d/t Lt. Leg knumb and tingling. Assisted w/stand by assist well to stedy. Became SOB and c/o tightness to chest. V/s WDL calmed down after resting in bed. Resp. 28 while up and 22 while resting.

## 2013-06-15 NOTE — Progress Notes (Signed)
CBGs in range.

## 2013-06-15 NOTE — Progress Notes (Signed)
Called to see pt who c/o shortness of breath with activity. Pt resting rate 24-28, O2 sats 97% on RA. Lungs clear. Assisted to side of bed and up to BR and back. States slightly weak. Able to bear weight and anbulate with 2 person assist. HR 97 supine, 99 seated, 101 after walking. Pt states SOB "not realy worse or better" with activity. Advised to call before getting OOB.

## 2013-06-15 NOTE — Patient Instructions (Signed)
Labor Induction  Labor induction is when steps are taken to cause a pregnant woman to begin the labor process. Most women go into labor on their own between 37 weeks and 42 weeks of the pregnancy. When this does not happen or when there is a medical need, methods may be used to induce labor. Labor induction causes a pregnant woman's uterus to contract. It also causes the cervix to soften (ripen), open (dilate), and thin out (efface). Usually, labor is not induced before 39 weeks of the pregnancy unless there is a problem with the baby or mother.  Before inducing labor, your health care provider will consider a number of factors, including the following:  The medical condition of you and the baby.   How many weeks along you are.   The status of the baby's lung maturity.   The condition of the cervix.   The position of the baby.  WHAT ARE THE REASONS FOR LABOR INDUCTION? Labor may be induced for the following reasons:  The health of the baby or mother is at risk.   The pregnancy is overdue by 1 week or more.   The water breaks but labor does not start on its own.   The mother has a health condition or serious illness, such as high blood pressure, infection, placental abruption, or diabetes.  The amniotic fluid amounts are low around the baby.   The baby is distressed.  Convenience or wanting the baby to be born on a certain date is not a reason for inducing labor. WHAT METHODS ARE USED FOR LABOR INDUCTION? Several methods of labor induction may be used, such as:   Prostaglandin medicine. This medicine causes the cervix to dilate and ripen. The medicine will also start contractions. It can be taken by mouth or by inserting a suppository into the vagina.   Inserting a thin tube (catheter) with a balloon on the end into the vagina to dilate the cervix. Once inserted, the balloon is expanded with water, which causes the cervix to open.   Stripping the membranes. Your health  care provider separates amniotic sac tissue from the cervix, causing the cervix to be stretched and causing the release of a hormone called progesterone. This may cause the uterus to contract. It is often done during an office visit. You will be sent home to wait for the contractions to begin. You will then come in for an induction.   Breaking the water. Your health care provider makes a hole in the amniotic sac using a small instrument. Once the amniotic sac breaks, contractions should begin. This may still take hours to see an effect.   Medicine to trigger or strengthen contractions. This medicine is given through an IV access tube inserted into a vein in your arm.  All of the methods of induction, besides stripping the membranes, will be done in the hospital. Induction is done in the hospital so that you and the baby can be carefully monitored.  HOW LONG DOES IT TAKE FOR LABOR TO BE INDUCED? Some inductions can take up to 2 3 days. Depending on the cervix, it usually takes less time. It takes longer when you are induced early in the pregnancy or if this is your first pregnancy. If a mother is still pregnant and the induction has been going on for 2 3 days, either the mother will be sent home or a cesarean delivery will be needed. WHAT ARE THE RISKS ASSOCIATED WITH LABOR INDUCTION? Some of the risks   of induction include:   Changes in fetal heart rate, such as too high, too low, or erratic.   Fetal distress.   Chance of infection for the mother and baby.   Increased chance of having a cesarean delivery.   Breaking off (abruption) of the placenta from the uterus (rare).   Uterine rupture (very rare).  When induction is needed for medical reasons, the benefits of induction may outweigh the risks. WHAT ARE SOME REASONS FOR NOT INDUCING LABOR? Labor induction should not be done if:   It is shown that your baby does not tolerate labor.   You have had previous surgeries on your  uterus, such as a myomectomy or the removal of fibroids.   Your placenta lies very low in the uterus and blocks the opening of the cervix (placenta previa).   Your baby is not in a head-down position.   The umbilical cord drops down into the birth canal in front of the baby. This could cut off the baby's blood and oxygen supply.   You have had a previous cesarean delivery.   There are unusual circumstances, such as the baby being extremely premature.  Document Released: 12/09/2006 Document Revised: 03/22/2013 Document Reviewed: 02/16/2013 [employer] Patient Information 2014 [employer].  

## 2013-06-15 NOTE — Progress Notes (Signed)
UR completed 

## 2013-06-15 NOTE — Anesthesia Preprocedure Evaluation (Signed)
Anesthesia Evaluation  Patient identified by MRN, date of birth, ID band Patient awake    Reviewed: Allergy & Precautions, H&P , NPO status , Patient's Chart, lab work & pertinent test results  Airway Mallampati: III TM Distance: >3 FB Neck ROM: Full    Dental  (+) Teeth Intact and Dental Advisory Given   Pulmonary neg pulmonary ROS,          Cardiovascular hypertension, Pt. on medications negative cardio ROS  Rhythm:Regular     Neuro/Psych PSYCHIATRIC DISORDERS Bipolar Disorder negative neurological ROS     GI/Hepatic Neg liver ROS, GERD-  Medicated,  Endo/Other  diabetes, Type 2, Insulin Dependent and Oral Hypoglycemic AgentsMorbid obesity  Renal/GU negative Renal ROS     Musculoskeletal negative musculoskeletal ROS (+)   Abdominal   Peds  Hematology negative hematology ROS (+) anemia ,   Anesthesia Other Findings   Reproductive/Obstetrics (+) Pregnancy                           Anesthesia Physical Anesthesia Plan  ASA: III  Anesthesia Plan: Epidural   Post-op Pain Management:    Induction:   Airway Management Planned:   Additional Equipment:   Intra-op Plan:   Post-operative Plan:   Informed Consent: I have reviewed the patients History and Physical, chart, labs and discussed the procedure including the risks, benefits and alternatives for the proposed anesthesia with the patient or authorized representative who has indicated his/her understanding and acceptance.     Plan Discussed with:   Anesthesia Plan Comments:         Anesthesia Quick Evaluation

## 2013-06-15 NOTE — Progress Notes (Signed)
Christina [name] is [name] 37 y.o. G2P0101 at [redacted]w[redacted]d by LMP admitted for rupture of membranes  Subjective: Comfortable  Objective: BP 133/72  Pulse 97  Temp(Src) 98.2 F (36.8 C) (Oral)  Resp 20  Ht 5\' 5"  (1.651 m)  Wt 123.832 kg (273 lb)  BMI 45.43 kg/m2  SpO2 97%  LMP 09/17/2012 I/O last 3 completed shifts: In: -  Out: 100 [Urine:100]    CBG (last 3)   Recent Labs  06/15/13 0403 06/15/13 0618 06/15/13 0759  GLUCAP 80 112* 103*     FHT:  FHR: 150 bpm, variability: moderate,  accelerations:  Present,  decelerations:  Present early, prolonged variable UC:   regular, every 2 minutes SVE:   Dilation: Lip/rim Effacement (%): 90 Station: +1 Exam by:: [email or web address], rnc  Labs: Lab Results  Component Value Date   WBC 7.8 06/15/2013   HGB 9.0* 06/15/2013   HCT 28.4* 06/15/2013   MCV 80.2 06/15/2013   PLT 280 06/15/2013    Assessment / Plan: Augmentation of labor, progressing well ?Malpositioned i.e., OP  Labor: Progressing normally Preeclampsia:  n/[name] Fetal Wellbeing:  Category II ; accelerations c/w fetal well-being Pain Control:  Epidural I/D:  n/[name] Anticipated MOD:  NSVD  JACKSON-MOORE,Christina [name] 06/15/2013, 8:43 AM

## 2013-06-15 NOTE — Lactation Note (Addendum)
This note was copied from the chart of Christina Penelopi Mikrut. Lactation Consultation Note: Initial visit with mom in BS- called to assist with feeding due to low blood sugar. Mom had breast reduction in 1994 and is Type II diabetic on insulin during pregnancy. Last baby was in the NICU and she did not plan to breast feed but did notice her breasts got fuller. Baby did take a few sucks on and off and mom reports that she feels tugging. Unable to hand express any Colostrum at this time. Mom wants to bottle feed formula at this time due to low blood sugar. Nursery RN to assist. No questions at present, Encouraged to watch for feeding cues and feed whenever she sees them. To call for assist prn  Patient Name: Christina [name] [id number] Date: 06/15/2013 Reason for consult: Initial assessment   Maternal Data Formula Feeding for Exclusion: Yes Reason for exclusion: Mother's choice to formula and breast feed on admission;Previous breast surgery (mastectomy, reduction, or augmentation where mother is unable to produce breast milk) Infant to breast within first hour of birth: Yes Has patient been taught Hand Expression?: Yes Does the patient have breastfeeding experience prior to this delivery?: Yes  Feeding Feeding Type: Breast Fed Length of feed: 5 min  LATCH Score/Interventions Latch: Repeated attempts needed to sustain latch, nipple held in mouth throughout feeding, stimulation needed to elicit sucking reflex.  Audible Swallowing: None  Type of Nipple: Flat  Comfort (Breast/Nipple): Soft / non-tender     Hold (Positioning): Assistance needed to correctly position infant at breast and maintain latch. Intervention(s): Breastfeeding basics reviewed;Support Pillows  LATCH Score: 5  Lactation Tools Discussed/Used     Consult Status Consult Status: Follow-up Date: 06/16/13 Follow-up type: In-patient    Pamelia Hoit 06/15/2013, 12:19 PM

## 2013-06-15 NOTE — Progress Notes (Signed)
Pt c/o feeling like blood sugar is low.  Pt informed glucometer to be done to determine CBG.  Pt verbalized understanding & agreeable

## 2013-06-15 NOTE — Progress Notes (Signed)
Notified Dr. Clearance Coots of SOB when up to BR w/stedy. C/o tightness mid sternum thru her shoulder blades and Lt. Shoulder. Not radiating down the arm. Just ordered to give percocet and monitor. Call if continues to be uncomfortable. BP WDL.

## 2013-06-16 ENCOUNTER — Inpatient Hospital Stay (HOSPITAL_COMMUNITY): Payer: Medicaid Other

## 2013-06-16 LAB — GLUCOSE, CAPILLARY: Glucose-Capillary: 112 mg/dL — ABNORMAL HIGH (ref 70–99)

## 2013-06-16 MED ORDER — INFLUENZA VAC SPLIT QUAD 0.5 ML IM SUSP
0.5000 mL | INTRAMUSCULAR | Status: AC
  Administered 2013-06-16: 0.5 mL via INTRAMUSCULAR

## 2013-06-16 MED ORDER — FUROSEMIDE 20 MG PO TABS
10.0000 mg | ORAL_TABLET | Freq: Once | ORAL | Status: DC
  Filled 2013-06-16: qty 0.5

## 2013-06-16 MED ORDER — FUROSEMIDE 20 MG PO TABS
20.0000 mg | ORAL_TABLET | Freq: Once | ORAL | Status: AC
  Administered 2013-06-16: 20 mg via ORAL
  Filled 2013-06-16: qty 1

## 2013-06-16 MED ORDER — METFORMIN HCL 500 MG PO TABS
1000.0000 mg | ORAL_TABLET | Freq: Two times a day (BID) | ORAL | Status: DC
  Administered 2013-06-16: 500 mg via ORAL
  Filled 2013-06-16: qty 2

## 2013-06-16 MED ORDER — METFORMIN HCL 500 MG PO TABS
500.0000 mg | ORAL_TABLET | Freq: Two times a day (BID) | ORAL | Status: DC
  Administered 2013-06-16 – 2013-06-17 (×2): 500 mg via ORAL
  Filled 2013-06-16 (×2): qty 1

## 2013-06-16 NOTE — Progress Notes (Signed)
Patient reports she has sob, 3+ edema in her extremeties, md ordered lasix and chest x-ray.

## 2013-06-16 NOTE — Progress Notes (Signed)
Post Partum Day 1 Subjective: no complaints, up ad lib, voiding, tolerating PO, + flatus and Patient desires to start back on metformin this morning due to elevated FBG  Objective: Blood pressure 120/80, pulse 96, temperature 98.2 F (36.8 C), temperature source Oral, resp. rate 19, height 5\' 5"  (1.651 m), weight 273 lb (123.832 kg), last menstrual period 09/17/2012, SpO2 98.00%, unknown if currently breastfeeding.  Physical Exam:  General: alert and cooperative Lochia: appropriate Uterine Fundus: firm Incision: healing well DVT Evaluation: No evidence of DVT seen on physical exam.   Recent Labs  06/15/13 0045  HGB 9.0*  HCT 28.4*    Assessment/Plan: Plan for discharge tomorrow, Breastfeeding and Contraception undecided Patient to RTC PRN for birth control management. Started metformin today 500 mg BID, may need to increase back to original dose (1000mg  BID) prior to pregnancy. Cont to monitor. Metformin is safe while breastfeeding Patient need Epi Pen at discharge.  PP education reviewed today. Anemia, patient to be on iron PP    LOS: 2 days   [facility], Kairie Vangieson 06/16/2013, 10:13 AM

## 2013-06-16 NOTE — Lactation Note (Signed)
This note was copied from the chart of Christina Makaylah Oddo. Lactation Consultation Note Mom has h/o breast reduction. Baby has been getting formula via bottle.  Mom states she would like to breast feed if possible.  Referred mom to [email or web address]. Discussed strategies to increase milk production. Inst mom to pump at least 8 times per day, at least once at night. Discussed SNS with mom, mom states she would be interested in trying to supplement baby at the breast. Offered to initiate SNS now, mom state baby just fed and is sound asleep. Informed evening LC; inst mom to call when ready to have LC set up SNS.   Patient Name: Christina [name] [id number] Date: 06/16/2013     Maternal Data    Feeding    LATCH Score/Interventions                      Lactation Tools Discussed/Used     Consult Status      Lenard Forth 06/16/2013, 3:21 PM

## 2013-06-17 MED ORDER — IBUPROFEN 600 MG PO TABS: 600.0000 mg | ORAL_TABLET | Freq: Four times a day (QID) | ORAL | Status: DC

## 2013-06-17 MED ORDER — FERROUS SULFATE 325 (65 FE) MG PO TABS: 325.0000 mg | ORAL_TABLET | Freq: Two times a day (BID) | ORAL | Status: DC

## 2013-06-17 NOTE — Discharge Summary (Signed)
Obstetric Discharge Summary Reason for Admission: onset of labor Prenatal Procedures: none Intrapartum Procedures: spontaneous vaginal delivery Postpartum Procedures: none Complications-Operative and Postpartum: none Hemoglobin  Date Value Range Status  06/15/2013 9.0* 12.0 - 15.0 g/dL Final  1/61/0960 45.4   Final     HCT  Date Value Range Status  06/15/2013 28.4* 36.0 - 46.0 % Final  10/25/2012 34   Final    Physical Exam:  General: alert Lochia: appropriate Uterine Fundus: firm Incision: healing well DVT Evaluation: No evidence of DVT seen on physical exam.  Discharge Diagnoses: Term Pregnancy-delivered  Discharge Information: Date: 06/17/2013 Activity: pelvic rest Diet: routine Medications: Percocet Condition: stable Instructions: refer to practice specific booklet Discharge to: home Follow-up Information   Follow up with HARPER,CHARLES A, MD.   Specialty:  Obstetrics and Gynecology   Contact information:   [address] [place] [address] [phone]       Newborn Data: Live born female  Birth Weight: 9 lb 15.6 oz (4525 g) APGAR: 8, 9  Home with mother.  MARSHALL,BERNARD A 06/17/2013, 7:18 AM

## 2013-06-17 NOTE — Discharge Summary (Signed)
Obstetric Discharge Summary Reason for Admission: onset of labor Prenatal Procedures: none Intrapartum Procedures: spontaneous vaginal delivery Postpartum Procedures: none Complications-Operative and Postpartum: none Hemoglobin  Date Value Range Status  06/15/2013 9.0* 12.0 - 15.0 g/dL Final  7/82/9562 13.0   Final     HCT  Date Value Range Status  06/15/2013 28.4* 36.0 - 46.0 % Final  10/25/2012 34   Final    Physical Exam:  General: alert Lochia: appropriate Uterine Fundus: firm Incision: healing well DVT Evaluation: No evidence of DVT seen on physical exam.  Discharge Diagnoses: Term Pregnancy-delivered  Discharge Information: Date: 06/17/2013 Activity: pelvic rest Diet: routine Medications: Percocet Condition: stable Instructions: refer to practice specific booklet Discharge to: home Follow-up Information   Follow up with HARPER,CHARLES A, MD.   Specialty:  Obstetrics and Gynecology   Contact information:   [address] [place] [address] [phone]       Newborn Data: Live born female  Birth Weight: 9 lb 15.6 oz (4525 g) APGAR: 8, 9  Home with mother.  MARSHALL,BERNARD A 06/17/2013, 7:17 AM

## 2013-06-18 ENCOUNTER — Inpatient Hospital Stay (HOSPITAL_COMMUNITY): Admission: RE | Admit: 2013-06-18 | Payer: PRIVATE HEALTH INSURANCE | Source: Ambulatory Visit

## 2013-06-26 ENCOUNTER — Telehealth: Payer: Self-pay | Admitting: Family Medicine

## 2013-06-26 NOTE — Telephone Encounter (Signed)
error 

## 2013-07-24 ENCOUNTER — Telehealth: Payer: Self-pay | Admitting: Family Medicine

## 2013-07-30 NOTE — Telephone Encounter (Signed)
Pls give appt for re establishment in January and let her know. (preferable by mid Jan), will be a "new visit appt" She will need to bring all medication to the visit

## 2013-08-05 ENCOUNTER — Telehealth: Payer: Self-pay | Admitting: Family Medicine

## 2013-08-05 DIAGNOSIS — I10 Essential (primary) hypertension: Secondary | ICD-10-CM

## 2013-08-05 DIAGNOSIS — D509 Iron deficiency anemia, unspecified: Secondary | ICD-10-CM

## 2013-08-05 DIAGNOSIS — R5381 Other malaise: Secondary | ICD-10-CM

## 2013-08-05 DIAGNOSIS — E119 Type 2 diabetes mellitus without complications: Secondary | ICD-10-CM

## 2013-08-05 DIAGNOSIS — R5383 Other fatigue: Secondary | ICD-10-CM

## 2013-08-05 DIAGNOSIS — Z139 Encounter for screening, unspecified: Secondary | ICD-10-CM

## 2013-08-05 NOTE — Telephone Encounter (Signed)
Pls order CbC , iron and ferritin, also fasting lippid , cmp and EGFr and TS H and microalb.  I spoke with pt , she knows to go directly to lab , so please fax request. Has appt 1/14 and should get before visit (HBA1C is up to date)

## 2013-08-07 ENCOUNTER — Ambulatory Visit: Payer: PRIVATE HEALTH INSURANCE | Admitting: Obstetrics & Gynecology

## 2013-08-07 NOTE — Telephone Encounter (Signed)
Labs ordered and faxed to lab

## 2013-08-07 NOTE — Addendum Note (Signed)
Addended by: Eual Fines on: 08/07/2013 08:01 AM   Modules accepted: Orders

## 2013-08-09 ENCOUNTER — Encounter: Payer: Self-pay | Admitting: Obstetrics & Gynecology

## 2013-08-09 ENCOUNTER — Ambulatory Visit (INDEPENDENT_AMBULATORY_CARE_PROVIDER_SITE_OTHER): Payer: Medicaid Other | Admitting: Obstetrics & Gynecology

## 2013-08-09 DIAGNOSIS — B3731 Acute candidiasis of vulva and vagina: Secondary | ICD-10-CM

## 2013-08-09 DIAGNOSIS — Z3009 Encounter for other general counseling and advice on contraception: Secondary | ICD-10-CM

## 2013-08-09 DIAGNOSIS — I10 Essential (primary) hypertension: Secondary | ICD-10-CM

## 2013-08-09 DIAGNOSIS — A6000 Herpesviral infection of urogenital system, unspecified: Secondary | ICD-10-CM

## 2013-08-09 DIAGNOSIS — B373 Candidiasis of vulva and vagina: Secondary | ICD-10-CM

## 2013-08-09 DIAGNOSIS — D649 Anemia, unspecified: Secondary | ICD-10-CM

## 2013-08-09 MED ORDER — FUSION PLUS PO CAPS: 1.0000 | ORAL_CAPSULE | Freq: Every day | ORAL | Status: DC

## 2013-08-09 MED ORDER — TRIAMTERENE-HCTZ 50-25 MG PO CAPS: 1.0000 | ORAL_CAPSULE | ORAL | Status: DC

## 2013-08-09 MED ORDER — VALACYCLOVIR HCL 500 MG PO TABS: ORAL_TABLET | ORAL | Status: DC

## 2013-08-09 MED ORDER — FLUCONAZOLE 150 MG PO TABS: 150.0000 mg | ORAL_TABLET | Freq: Once | ORAL | Status: DC

## 2013-08-09 NOTE — Progress Notes (Signed)
  Subjective:     Christina [name] is a 38 y.o. female who presents for a postpartum visit. She is 7 weeks postpartum following a spontaneous vaginal delivery. I have fully reviewed the prenatal and intrapartum course. The delivery was at 40 gestational weeks. Outcome: spontaneous vaginal delivery. Anesthesia: epidural. Postpartum course has been WNL. Baby's course has been WNL. Baby is feeding by bottle Dory Horn- Soothe. Bleeding no bleeding. Bowel function is normal. Bladder function is normal. Patient is not sexually active. Contraception method is abstinence. Postpartum depression screening: negative.  The following portions of the patient's history were reviewed and updated as appropriate: allergies, current medications, past family history, past medical history, past social history, past surgical history and problem list.  Review of Systems Pertinent items are noted in HPI.   Objective:    Temp(Src) 98.5 F (36.9 C)  Ht 5\' 5"  (1.651 m)  Wt 237 lb (107.502 kg)  BMI 39.44 kg/m2  LMP 07/22/2013  Breastfeeding? No  General:  alert and no distress Breasts:  Negative Uterus:  NSSC, NT Adnexa:  Negative      Assessment:     Normal postpartum exam. Pap smear not done at today's visit.    Considering IUD.   HTN  Plan:    1. Contraception: IUD 2. Dyazide Rx 3. Follow up in: several weeks or as needed.

## 2013-08-10 LAB — MICROALBUMIN / CREATININE URINE RATIO
Creatinine, Urine: 234.4 mg/dL
Microalb Creat Ratio: 77.9 mg/g — ABNORMAL HIGH (ref 0.0–30.0)
Microalb, Ur: 18.27 mg/dL — ABNORMAL HIGH (ref 0.00–1.89)

## 2013-08-10 LAB — CBC WITH DIFFERENTIAL/PLATELET
Basophils Absolute: 0 10*3/uL (ref 0.0–0.1)
Basophils Relative: 0 % (ref 0–1)
Eosinophils Absolute: 0.1 10*3/uL (ref 0.0–0.7)
Eosinophils Relative: 1 % (ref 0–5)
HCT: 36.6 % (ref 36.0–46.0)
Hemoglobin: 11.9 g/dL — ABNORMAL LOW (ref 12.0–15.0)
Lymphocytes Relative: 48 % — ABNORMAL HIGH (ref 12–46)
Lymphs Abs: 2.4 10*3/uL (ref 0.7–4.0)
MCH: 26.7 pg (ref 26.0–34.0)
MCHC: 32.5 g/dL (ref 30.0–36.0)
MCV: 82.2 fL (ref 78.0–100.0)
Monocytes Absolute: 0.3 10*3/uL (ref 0.1–1.0)
Monocytes Relative: 6 % (ref 3–12)
Neutro Abs: 2.2 10*3/uL (ref 1.7–7.7)
Neutrophils Relative %: 45 % (ref 43–77)
Platelets: 356 10*3/uL (ref 150–400)
RBC: 4.45 MIL/uL (ref 3.87–5.11)
RDW: 19.7 % — ABNORMAL HIGH (ref 11.5–15.5)
WBC: 4.9 10*3/uL (ref 4.0–10.5)

## 2013-08-10 LAB — COMPLETE METABOLIC PANEL WITH GFR
ALT: 32 U/L (ref 0–35)
AST: 26 U/L (ref 0–37)
Albumin: 4.5 g/dL (ref 3.5–5.2)
Alkaline Phosphatase: 73 U/L (ref 39–117)
BUN: 9 mg/dL (ref 6–23)
CO2: 28 mEq/L (ref 19–32)
Calcium: 9.4 mg/dL (ref 8.4–10.5)
Chloride: 101 mEq/L (ref 96–112)
Creat: 0.82 mg/dL (ref 0.50–1.10)
GFR, Est African American: >89 mL/min
GFR, Est Non African American: >89 mL/min
Glucose, Bld: 163 mg/dL — ABNORMAL HIGH (ref 70–99)
Potassium: 3.9 mEq/L (ref 3.5–5.3)
Sodium: 136 mEq/L (ref 135–145)
Total Bilirubin: 0.5 mg/dL (ref 0.3–1.2)
Total Protein: 7.4 g/dL (ref 6.0–8.3)

## 2013-08-10 LAB — LIPID PANEL
Cholesterol: 176 mg/dL (ref 0–200)
HDL: 68 mg/dL (ref >39)
LDL Cholesterol: 76 mg/dL (ref 0–99)
Total CHOL/HDL Ratio: 2.6 Ratio
Triglycerides: 158 mg/dL — ABNORMAL HIGH (ref <150)
VLDL: 32 mg/dL (ref 0–40)

## 2013-08-10 LAB — IRON: Iron: 68 ug/dL (ref 42–145)

## 2013-08-10 LAB — FERRITIN: Ferritin: 12 ng/mL (ref 10–291)

## 2013-08-10 LAB — TSH: TSH: 0.826 u[IU]/mL (ref 0.350–4.500)

## 2013-08-16 ENCOUNTER — Encounter: Payer: Self-pay | Admitting: Family Medicine

## 2013-08-16 ENCOUNTER — Ambulatory Visit (INDEPENDENT_AMBULATORY_CARE_PROVIDER_SITE_OTHER): Payer: Medicaid Other | Admitting: Family Medicine

## 2013-08-16 ENCOUNTER — Encounter (INDEPENDENT_AMBULATORY_CARE_PROVIDER_SITE_OTHER): Payer: Self-pay

## 2013-08-16 VITALS — BP 136/82 | HR 97 | Resp 18 | Ht 65.5 in | Wt 235.1 lb

## 2013-08-16 DIAGNOSIS — K219 Gastro-esophageal reflux disease without esophagitis: Secondary | ICD-10-CM

## 2013-08-16 DIAGNOSIS — I10 Essential (primary) hypertension: Secondary | ICD-10-CM

## 2013-08-16 DIAGNOSIS — F319 Bipolar disorder, unspecified: Secondary | ICD-10-CM

## 2013-08-16 DIAGNOSIS — D649 Anemia, unspecified: Secondary | ICD-10-CM

## 2013-08-16 DIAGNOSIS — E669 Obesity, unspecified: Secondary | ICD-10-CM

## 2013-08-16 DIAGNOSIS — H60509 Unspecified acute noninfective otitis externa, unspecified ear: Secondary | ICD-10-CM | POA: Insufficient documentation

## 2013-08-16 DIAGNOSIS — B3731 Acute candidiasis of vulva and vagina: Secondary | ICD-10-CM

## 2013-08-16 DIAGNOSIS — K3184 Gastroparesis: Secondary | ICD-10-CM

## 2013-08-16 DIAGNOSIS — B373 Candidiasis of vulva and vagina: Secondary | ICD-10-CM

## 2013-08-16 DIAGNOSIS — E119 Type 2 diabetes mellitus without complications: Secondary | ICD-10-CM

## 2013-08-16 DIAGNOSIS — D509 Iron deficiency anemia, unspecified: Secondary | ICD-10-CM

## 2013-08-16 MED ORDER — METFORMIN HCL ER (MOD) 1000 MG PO TB24: ORAL_TABLET | ORAL | Status: DC

## 2013-08-16 MED ORDER — DAPAGLIFLOZIN PROPANEDIOL 10 MG PO TABS: 1.0000 | ORAL_TABLET | Freq: Every day | ORAL | Status: DC

## 2013-08-16 MED ORDER — BENAZEPRIL HCL 10 MG PO TABS: 10.0000 mg | ORAL_TABLET | Freq: Every day | ORAL | Status: DC

## 2013-08-16 MED ORDER — EPINEPHRINE 0.3 MG/0.3ML IJ SOAJ: INTRAMUSCULAR | Status: DC

## 2013-08-16 MED ORDER — FLUCONAZOLE 150 MG PO TABS: ORAL_TABLET | ORAL | Status: DC

## 2013-08-16 NOTE — Assessment & Plan Note (Signed)
No sign of acute infection, topical steroid prescribed, TM clear

## 2013-08-16 NOTE — Assessment & Plan Note (Signed)
Controlled, no change in medication  

## 2013-08-16 NOTE — Assessment & Plan Note (Signed)
Fluconazole prescribed for as needed use

## 2013-08-16 NOTE — Assessment & Plan Note (Signed)
Improving over time, pt to continue iron supplement daily

## 2013-08-16 NOTE — Assessment & Plan Note (Addendum)
  Pt needs to re establish with psychiatry to resume medication

## 2013-08-16 NOTE — Assessment & Plan Note (Addendum)
Pt reports elevated blood sugars in past several weeks , with fasting blood sugars averaging 150's , will add farxiga Patient advised to reduce carb and sweets, commit to regular physical activity, take meds as prescribed, test blood as directed, and attempt to lose weight, to improve blood sugar control.

## 2013-08-16 NOTE — Assessment & Plan Note (Signed)
Pt has not used reglan for a while, she is advised against using it due to increased depression risk

## 2013-08-16 NOTE — Progress Notes (Signed)
   Subjective:    Patient ID: Christina [name], female    DOB: 1976/04/09, 38 y.o.   MRN: [id number]  HPI The PT is here for follow to re establish care, and she has had  a 2nd child which is a girl , approximately 2 months ago,and re-evaluation of chronic medical conditions, medication management and review of any available recent lab  data.  Preventive health is updated, specifically  Cancer screening and Immunization.  Has an eye exam coming up next month States that she will schedule an appointment with her psychiatrist, as she needs to resume medication. C/o increased and uncontrolled blood sugars in the past 3 weeks, her fasting sugars are averaging over 150's  C/o ear irritation with scratching in the ears for the  past several weeks, denies drainage from the ears and ear pain.    Review of Systems See HPI Denies recent fever or chills.  Denies sinus pressure, nasal congestion,  or sore throat. Denies chest congestion, productive cough or wheezing. Denies chest pains, palpitations and leg swelling Denies abdominal pain, nausea, vomiting,diarrhea or constipation.   Denies dysuria, frequency, hesitancy or incontinence. Denies joint pain, swelling and limitation in mobility. Denies headaches, seizures, numbness, or tingling. Denies depression, anxiety or insomnia. Denies skin break down or rash.        Objective:   Physical Exam  Patient alert and oriented and in no cardiopulmonary distress.  HEENT: No facial asymmetry, EOMI, no sinus tenderness,  oropharynx pink and moist.  Neck supple no adenopathy.External ear canals are erythematous no ulceration, no erythema or edema of nasal mucosa  Chest: Clear to auscultation bilaterally.  CVS: S1, S2 no murmurs, no S3.  ABD: Soft non tender. Bowel sounds normal.  Ext: No edema  MS: Adequate ROM spine, shoulders, hips and knees.  Skin: Intact, no ulcerations or rash noted.  Psych: Good eye contact, normal affect. Memory intact  not anxious or depressed appearing.  CNS: CN 2-12 intact, power, tone and sensation normal throughout.       Assessment & Plan:

## 2013-08-16 NOTE — Patient Instructions (Signed)
F/U mid May, call if you need me before please   HBA1C, chem 7 and EGFr on Feb 3 non fasting  Pls concentrate on healthy eating and regular exercise to improve blood sugar  New for blood sugar is farxiga , you will get 1 bottle of 58m tabs take one daily till done, then increase to the 147mtablet one daily till done, script is sent in for the 1017mablet.  New for kidney protection is benazepril one daily  Reduce fried and fatty foods please, also since you are diabetic statin drug recommended toreduce drisk of heart disease and is sent in  Epi pen sent in  Med ear drops for itchy ear sent in,  HBA1C , cmp  And EGFr, non fasting about 3 days before May visit

## 2013-08-16 NOTE — Assessment & Plan Note (Signed)
Improved, pt needs to continue daily iron supplement

## 2013-08-16 NOTE — Assessment & Plan Note (Signed)
Unchnaged. Patient re-educated about  the importance of commitment to a  minimum of 150 minutes of exercise per week. The importance of healthy food choices with portion control discussed. Encouraged to start a food diary, count calories and to consider  joining a support group. Sample diet sheets offered. Goals set by the patient for the next several months.    

## 2013-08-17 MED ORDER — ACETIC ACID 2 % OT SOLN: 4.0000 [drp] | Freq: Three times a day (TID) | OTIC | Status: AC

## 2013-08-17 NOTE — Addendum Note (Signed)
Addended by: [name] on: 08/17/2013 05:29 PM   Modules accepted: Orders

## 2013-08-22 ENCOUNTER — Other Ambulatory Visit: Payer: Medicaid Other

## 2013-08-22 VITALS — BP 149/101 | HR 88 | Temp 98.8°F | Ht 65.0 in | Wt 238.0 lb

## 2013-08-22 DIAGNOSIS — Z3202 Encounter for pregnancy test, result negative: Secondary | ICD-10-CM

## 2013-08-22 NOTE — Progress Notes (Unsigned)
Patient in office today for a pregnancy test. Patient states she took two home pregnancy test this weekend which both came back positive.  Urine Pregnancy Test in office was negative.

## 2013-08-24 ENCOUNTER — Other Ambulatory Visit: Payer: Self-pay | Admitting: *Deleted

## 2013-08-24 DIAGNOSIS — Z7251 High risk heterosexual behavior: Secondary | ICD-10-CM

## 2013-08-28 ENCOUNTER — Ambulatory Visit (INDEPENDENT_AMBULATORY_CARE_PROVIDER_SITE_OTHER): Payer: Self-pay | Admitting: Psychiatry

## 2013-08-28 ENCOUNTER — Other Ambulatory Visit: Payer: Medicaid Other

## 2013-08-28 ENCOUNTER — Encounter (HOSPITAL_COMMUNITY): Payer: Self-pay | Admitting: Psychiatry

## 2013-08-28 VITALS — BP 144/93 | HR 88 | Ht 65.0 in | Wt 238.4 lb

## 2013-08-28 DIAGNOSIS — Z7251 High risk heterosexual behavior: Secondary | ICD-10-CM

## 2013-08-28 DIAGNOSIS — F319 Bipolar disorder, unspecified: Secondary | ICD-10-CM

## 2013-08-28 MED ORDER — ARIPIPRAZOLE 5 MG PO TABS: 5.0000 mg | ORAL_TABLET | Freq: Every day | ORAL | Status: DC

## 2013-08-28 NOTE — Progress Notes (Signed)
[facility] [id number] Progress Note  Christina [name] [id number] 38 y.o.  08/28/2013 1:34 PM  Chief Complaint:  The primary care physician suggested that I should start medication.   History of Present Illness:  Christina [name] came for her appointment.  She was last seen in October .  She delivered the baby in November .  She supposed to come for a followup appointment in November but she missed appointment .  She admitted busy schedule with the newborn .  She is experiencing fatigue , lack of sleep, tired and irritability.  She continues to have issue with her mother-in-law .  She and her husband living with them .  Patient wants to have her own place but at this time cannot afford.  Her newborn is 8 weeks old .  Patient experience some postpartum depression but overall she denies any suicidal thoughts or homicidal thoughts.  She recently visited her primary care physician who suggested to restart her bipolar medication.  Patient has support from her husband .  Patient denies any agitation, anger or any severe mood swings but admitted some time irritability, crying spells and poor sleep.  She has another 7-year-old child.  Patient is not drinking or using any illegal substances.  She also endorsed that she should restart bipolar medication because she does not want to her symptoms get worse.  Suicidal Ideation: No Plan Formed: No Patient has means to carry out plan: No  Homicidal Ideation: No Plan Formed: No Patient has means to carry out plan: No  Medical History; Patient has history of diabetes , hypertension, gastroparesis, goiter and GERD  Psychosocial History; Patient was born and raised in [place].  She moved to [place] few years ago and now moved back and stay with her in-laws.  Past Psychiatric History/Hospitalization(s) Patient has been seeing in this office in 2006. She was referred from her primary care physician Dr. Simpson. She has history of depression and mania started  with psychosis. In 1997 she was admitted at [facility] because of suicidal gesture.  She hit her leg with a piece of wood.  She was given Prozac however she stopped taking it when she get better.  She is on Wellbutrin and Abilify for a long time which always worked very well for her.  Patient denies any history of sexual, verbal emotional abuse.   Anxiety: Yes Bipolar Disorder: Yes Depression: Yes Mania: Yes Psychosis: Yes Schizophrenia: No Personality Disorder: No Hospitalization for psychiatric illness: Yes History of Electroconvulsive Shock Therapy: No Prior Suicide Attempts: No   Review of Systems: Psychiatric: Agitation: No Hallucination: No Depressed Mood: Yes Insomnia: Yes Hypersomnia: No Altered Concentration: No Feels Worthless: No Grandiose Ideas: No Belief In Special Powers: No New/Increased Substance Abuse: No Compulsions: No  Neurologic: Headache: No Seizure: No Paresthesias: No    Outpatient Encounter Prescriptions as of 08/28/2013  Medication Sig  . Iron-FA-B Cmp-C-Biot-Probiotic (FUSION PLUS) CAPS Take 1 capsule by mouth daily before breakfast.  . metFORMIN (GLUMETZA) 1000 MG (MOD) 24 hr tablet Two tablets once daily at breakfast  . omeprazole (PRILOSEC OTC) 20 MG tablet Take 20 mg by mouth daily. 1 po 30 minutes prior to breakfast  . triamterene-hydrochlorothiazide (DYAZIDE) 50-25 MG per capsule Take 1 capsule by mouth every morning.  . valACYclovir (VALTREX) 500 MG tablet Take 1 tablet po bid x 3 days prn.  . ARIPiprazole (ABILIFY) 5 MG tablet Take 1 tablet (5 mg total) by mouth daily.  . benazepril (LOTENSIN) 10 MG tablet Take 1   tablet (10 mg total) by mouth daily.  . Dapagliflozin Propanediol 10 MG TABS Take 1 tablet by mouth daily.  . EPINEPHrine (EPI-PEN) 0.3 mg/0.3 mL SOAJ injection Use as directed  . fluconazole (DIFLUCAN) 150 MG tablet One tablet once daily as needed, for vaginal yeast infection    Physical Exam: Constitutional:  BP 144/93  Pulse 88   Ht 5' 5" (1.651 m)  Wt 238 lb 6.4 oz (108.138 kg)  BMI 39.67 kg/m2  LMP 07/22/2013  Recent Results (from the past 2160 hour(s))  POCT URINALYSIS DIPSTICK     Status: None   Collection Time    06/01/13 10:44 AM      Result Value Range   Color, UA       Clarity, UA       Glucose, UA       Bilirubin, UA       Ketones, UA 1+     Spec Grav, UA 1.020     Blood, UA       pH, UA 6.0     Protein, UA       Urobilinogen, UA       Nitrite, UA       Leukocytes, UA Trace    POCT URINALYSIS DIPSTICK     Status: None   Collection Time    06/05/13 10:25 AM      Result Value Range   Color, UA YELLOW     Clarity, UA CLEAR     Glucose, UA NEGATIVE     Bilirubin, UA NEGATIVE     Ketones, UA 1+     Spec Grav, UA 1.015     Blood, UA NEGATIVE     pH, UA 6.0     Protein, UA TRACE     Urobilinogen, UA negative     Nitrite, UA NEGATIVE     Leukocytes, UA moderate (2+)    HEMOGLOBIN A1C     Status: Abnormal   Collection Time    06/05/13  5:09 PM      Result Value Range   Hemoglobin A1C 6.9 (*) <5.7 %   Comment:                                                                            According to the ADA Clinical Practice Recommendations for 2011, when     HbA1c is used as a screening test:             >=6.5%   Diagnostic of Diabetes Mellitus                (if abnormal result is confirmed)           5.7-6.4%   Increased risk of developing Diabetes Mellitus           References:Diagnosis and Classification of Diabetes Mellitus,Diabetes     Care,2011,34(Suppl 1):S62-S69 and Standards of Medical Care in             Diabetes - 2011,Diabetes Care,2011,34 (Suppl 1):S11-S61.         Mean Plasma Glucose 151 (*) <117 mg/dL  COMPREHENSIVE METABOLIC PANEL     Status: Abnormal   Collection Time    06/05/13  5:09   PM      Result Value Range   Sodium 137  135 - 145 mEq/L   Potassium 4.2  3.5 - 5.3 mEq/L   Chloride 103  96 - 112 mEq/L   CO2 20  19 - 32 mEq/L   Glucose, Bld 93  70 - 99 mg/dL    BUN 10  6 - 23 mg/dL   Creat 0.70  0.50 - 1.10 mg/dL   Total Bilirubin 0.3  0.3 - 1.2 mg/dL   Alkaline Phosphatase 155 (*) 39 - 117 U/L   AST 17  0 - 37 U/L   ALT 15  0 - 35 U/L   Total Protein 6.4  6.0 - 8.3 g/dL   Albumin 3.4 (*) 3.5 - 5.2 g/dL   Calcium 9.2  8.4 - 10.5 mg/dL  OB RESULTS CONSOLE GBS     Status: None   Collection Time    06/12/13 12:00 AM      Result Value Range   GBS Positive    POCT URINALYSIS DIPSTICK     Status: None   Collection Time    06/12/13 10:38 AM      Result Value Range   Color, UA YELLOW     Clarity, UA CLEAR     Glucose, UA NEGATIVE     Bilirubin, UA NEGATIVE     Ketones, UA NEGATIVE     Spec Grav, UA 1.020     Blood, UA NEGATIVE     pH, UA 6.0     Protein, UA NEGATIVE     Urobilinogen, UA negative     Nitrite, UA NEGATIVE     Leukocytes, UA Negative    AMNISURE RUPTURE OF MEMBRANE (ROM)     Status: None   Collection Time    06/12/13 11:47 PM      Result Value Range   Amnisure ROM NEGATIVE    POCT FERN TEST     Status: None   Collection Time    06/13/13 12:25 AM      Result Value Range   POCT Fern Test Negative = intact amniotic membranes    POCT FERN TEST     Status: None   Collection Time    06/15/13 12:24 AM      Result Value Range   POCT Fern Test Positive = ruptured amniotic membanes    GLUCOSE, CAPILLARY     Status: Abnormal   Collection Time    06/15/13 12:25 AM      Result Value Range   Glucose-Capillary 146 (*) 70 - 99 mg/dL  CBC     Status: Abnormal   Collection Time    06/15/13 12:45 AM      Result Value Range   WBC 7.8  4.0 - 10.5 K/uL   Comment: REPEATED TO VERIFY     ADJUSTED FOR NUCLEATED RBC'S   RBC 3.54 (*) 3.87 - 5.11 MIL/uL   Hemoglobin 9.0 (*) 12.0 - 15.0 g/dL   HCT 28.4 (*) 36.0 - 46.0 %   MCV 80.2  78.0 - 100.0 fL   MCH 25.4 (*) 26.0 - 34.0 pg   MCHC 31.7  30.0 - 36.0 g/dL   RDW 18.5 (*) 11.5 - 15.5 %   Platelets 280  150 - 400 K/uL  RPR     Status: None   Collection Time    06/15/13 12:45 AM       Result Value Range   RPR NON REACTIVE  NON REACTIVE   Comment:  Performed at [facility]     Status: None   Collection Time    06/15/13 12:45 AM      Result Value Range   ABO/RH(D) A POS     Antibody Screen NEG     Sample Expiration 06/18/2013    ABO/RH     Status: None   Collection Time    06/15/13 12:45 AM      Result Value Range   ABO/RH(D) A POS    GLUCOSE, CAPILLARY     Status: None   Collection Time    06/15/13  4:03 AM      Result Value Range   Glucose-Capillary 80  70 - 99 mg/dL  GLUCOSE, CAPILLARY     Status: Abnormal   Collection Time    06/15/13  6:18 AM      Result Value Range   Glucose-Capillary 112 (*) 70 - 99 mg/dL  GLUCOSE, CAPILLARY     Status: Abnormal   Collection Time    06/15/13  7:59 AM      Result Value Range   Glucose-Capillary 103 (*) 70 - 99 mg/dL  GLUCOSE, CAPILLARY     Status: Abnormal   Collection Time    06/15/13  4:05 PM      Result Value Range   Glucose-Capillary 233 (*) 70 - 99 mg/dL  GLUCOSE, CAPILLARY     Status: Abnormal   Collection Time    06/16/13  7:28 AM      Result Value Range   Glucose-Capillary 112 (*) 70 - 99 mg/dL  CBC WITH DIFFERENTIAL     Status: Abnormal   Collection Time    08/09/13  9:38 AM      Result Value Range   WBC 4.9  4.0 - 10.5 K/uL   RBC 4.45  3.87 - 5.11 MIL/uL   Hemoglobin 11.9 (*) 12.0 - 15.0 g/dL   HCT 36.6  36.0 - 46.0 %   MCV 82.2  78.0 - 100.0 fL   MCH 26.7  26.0 - 34.0 pg   MCHC 32.5  30.0 - 36.0 g/dL   RDW 19.7 (*) 11.5 - 15.5 %   Platelets 356  150 - 400 K/uL   Neutrophils Relative % 45  43 - 77 %   Neutro Abs 2.2  1.7 - 7.7 K/uL   Lymphocytes Relative 48 (*) 12 - 46 %   Lymphs Abs 2.4  0.7 - 4.0 K/uL   Monocytes Relative 6  3 - 12 %   Monocytes Absolute 0.3  0.1 - 1.0 K/uL   Eosinophils Relative 1  0 - 5 %   Eosinophils Absolute 0.1  0.0 - 0.7 K/uL   Basophils Relative 0  0 - 1 %   Basophils Absolute 0.0  0.0 - 0.1 K/uL   Smear Review Criteria for review  not met    FERRITIN     Status: None   Collection Time    08/09/13  9:38 AM      Result Value Range   Ferritin 12  10 - 291 ng/mL  IRON     Status: None   Collection Time    08/09/13  9:38 AM      Result Value Range   Iron 68  42 - 145 ug/dL  LIPID PANEL     Status: Abnormal   Collection Time    08/09/13  9:38 AM      Result Value Range   Cholesterol 176  0 - 200 mg/dL   Comment: ATP III Classification:           < 200        mg/dL        Desirable          200 - 239     mg/dL        Borderline High          >= 240        mg/dL        High         Triglycerides 158 (*) <150 mg/dL   HDL 68  >39 mg/dL   Total CHOL/HDL Ratio 2.6     VLDL 32  0 - 40 mg/dL   LDL Cholesterol 76  0 - 99 mg/dL   Comment:       Total Cholesterol/HDL Ratio:CHD Risk                            Coronary Heart Disease Risk Table                                            Men       Women              1/2 Average Risk              3.4        3.3                  Average Risk              5.0        4.4               2X Average Risk              9.6        7.1               3X Average Risk             23.4       11.0     Use the calculated Patient Ratio above and the CHD Risk table      to determine the patient's CHD Risk.     ATP III Classification (LDL):           < 100        mg/dL         Optimal          100 - 129     mg/dL         Near or Above Optimal          130 - 159     mg/dL         Borderline High          160 - 189     mg/dL         High           > 190        mg/dL         Very High        COMPLETE METABOLIC PANEL WITH GFR     Status: Abnormal   Collection Time    08/09/13  9:38 AM      Result Value  Range   Sodium 136  135 - 145 mEq/L   Potassium 3.9  3.5 - 5.3 mEq/L   Chloride 101  96 - 112 mEq/L   CO2 28  19 - 32 mEq/L   Glucose, Bld 163 (*) 70 - 99 mg/dL   BUN 9  6 - 23 mg/dL   Creat 0.82  0.50 - 1.10 mg/dL   Total Bilirubin 0.5  0.3 - 1.2 mg/dL   Alkaline Phosphatase 73  39 -  117 U/L   AST 26  0 - 37 U/L   ALT 32  0 - 35 U/L   Total Protein 7.4  6.0 - 8.3 g/dL   Albumin 4.5  3.5 - 5.2 g/dL   Calcium 9.4  8.4 - 10.5 mg/dL   GFR, Est African American >89     GFR, Est Non African American >89     Comment:       The estimated GFR is a calculation valid for adults (>=18 years old)     that uses the CKD-EPI algorithm to adjust for age and sex. It is       not to be used for children, pregnant women, hospitalized patients,        patients on dialysis, or with rapidly changing kidney function.     According to the NKDEP, eGFR >89 is normal, 60-89 shows mild     impairment, 30-59 shows moderate impairment, 15-29 shows severe     impairment and <15 is ESRD.        TSH     Status: None   Collection Time    08/09/13  9:38 AM      Result Value Range   TSH 0.826  0.350 - 4.500 uIU/mL  MICROALBUMIN / CREATININE URINE RATIO     Status: Abnormal   Collection Time    08/09/13  9:38 AM      Result Value Range   Microalb, Ur 18.27 (*) 0.00 - 1.89 mg/dL   Creatinine, Urine 234.4     Microalb Creat Ratio 77.9 (*) 0.0 - 30.0 mg/g    Musculoskeletal: Strength & Muscle Tone: within normal limits Gait & Station: normal Patient leans: Front  Mental Status Examination;  Patient is a middle-aged female who is casually dressed and fairly groomed.  Her speech is fast but clear and coherent.  Her thought processes logical and goal directed.  She described her mood tired and exhausted.  Her affect is constricted.  She denies any active or passive suicidal thoughts or homicidal thoughts.  There were no paranoia or delusions present at this time.  Her fund of knowledge is adequate.  She is alert and oriented x3.  Her attention and concentration is fair.  Her insight judgment and impulse control is okay.   Medical Decision Making (Choose Three): Established Problem, Stable/Improving (1), Review of Psycho-Social Stressors (1), Review or order clinical lab tests (1), Review and  summation of old records (2), Review of Last Therapy Session (1), Review of Medication Regimen & Side Effects (2) and Review of New Medication or Change in Dosage (2)  Assessment: Axis I: Bipolar disorder NOS  Axis II: Deferred  Axis III:  Past Medical History  Diagnosis Date  . GERD (gastroesophageal reflux disease)   . Bipolar disorder   . Gastroparesis   . 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 Hypertension     chronic on meds  . Diabetes mellitus     type 2 DM,   insulin only needed during pregnancy  . Chronic kidney disease     proteinuria    Axis IV: Mild   Plan:  I review her symptoms, history, current medication and past records.  I will start Abilify 5 mg daily.  In the past she used to take Abilify 15 mg daily, Wellbutrin 300 mg daily.  However we will defer Wellbutrin at this time.  I will see her again in 4 weeks , we will consider adding Wellbutrin on her next visit.  Patient endorsed a good support system from her husband .  Patient does not want counseling at this time.  Discussed in detail medication side effects and benefits.  At this time patient is not breast feeding .  Discussed safety plan and recommend to call us back if she has any question consent after crossing the placenta.  Time spent 25 minutes.  Followup in 4 weeks  Jaylin Benzel T., MD 08/28/2013

## 2013-08-29 LAB — HCG, QUANTITATIVE, PREGNANCY: hCG, Beta Chain, Quant, S: <2 m[IU]/mL

## 2013-09-03 ENCOUNTER — Encounter: Payer: Self-pay | Admitting: Family Medicine

## 2013-09-05 ENCOUNTER — Other Ambulatory Visit: Payer: Self-pay

## 2013-09-05 DIAGNOSIS — E119 Type 2 diabetes mellitus without complications: Secondary | ICD-10-CM

## 2013-09-06 ENCOUNTER — Telehealth: Payer: Self-pay

## 2013-09-06 ENCOUNTER — Other Ambulatory Visit: Payer: Self-pay

## 2013-09-06 MED ORDER — DIPHENOXYLATE-ATROPINE 2.5-0.025 MG PO TABS: 1.0000 | ORAL_TABLET | Freq: Four times a day (QID) | ORAL | Status: DC | PRN

## 2013-09-06 MED ORDER — ONDANSETRON HCL 4 MG PO TABS: 4.0000 mg | ORAL_TABLET | Freq: Every day | ORAL | Status: DC

## 2013-09-06 MED ORDER — CANAGLIFLOZIN 300 MG PO TABS: 1.0000 | ORAL_TABLET | Freq: Every day | ORAL | Status: DC

## 2013-09-06 MED ORDER — METFORMIN HCL ER 500 MG PO TB24: 2000.0000 mg | ORAL_TABLET | Freq: Every day | ORAL | Status: DC

## 2013-09-06 NOTE — Progress Notes (Signed)
Christina [name] and Glumetza not on formulary.  Meds changed to Invokana 300mg  daily and Metformin XR 500mg  4 tab daily.   Patient aware and meds sent to [facility] in [place].   Patient will have labs next week due to a delay in getting meds.

## 2013-09-06 NOTE — Telephone Encounter (Signed)
Vomiting yes   Patient reports vomiting Sunday with diarrhea and now she is just having nausea and diarrhea several times a day. Is doing BRAT diet and drinking fluids. Will call in meds and will call back after taking if no better.   Recommended treatment Hydration is important Fluids small frequent amounts as tolerated Good hygiene reduces transmission among family members Review Brat diet  Zofran 4 mg 1 tablet daily as needed for nausea and vomiting no more than 6 tablets   Diarrheayes  Recommended treatment  Imodium OTC  Can also offer Lomotil 1 tablet 4 times daily as needed no more than 10 tablets Good hygiene reduces transmission among family members Review Brat Diet  If patient starts to feel light headed or not passing much urine or becoming dehydrated will need to go to emergency room for IV hydration  Please call office if symptoms worsen or do not improve after 2-3 days

## 2013-09-14 ENCOUNTER — Encounter: Payer: Self-pay | Admitting: Family Medicine

## 2013-09-16 ENCOUNTER — Emergency Department (HOSPITAL_COMMUNITY)
Admission: EM | Admit: 2013-09-16 | Discharge: 2013-09-16 | Disposition: A | Discharge status: Home / Self Care | Payer: Medicaid Other | Attending: Emergency Medicine | Admitting: Emergency Medicine

## 2013-09-16 ENCOUNTER — Encounter (HOSPITAL_COMMUNITY): Payer: Self-pay | Admitting: Emergency Medicine

## 2013-09-16 ENCOUNTER — Emergency Department (HOSPITAL_COMMUNITY): Payer: Medicaid Other

## 2013-09-16 DIAGNOSIS — R509 Fever, unspecified: Secondary | ICD-10-CM | POA: Insufficient documentation

## 2013-09-16 DIAGNOSIS — Z79899 Other long term (current) drug therapy: Secondary | ICD-10-CM | POA: Insufficient documentation

## 2013-09-16 DIAGNOSIS — R197 Diarrhea, unspecified: Secondary | ICD-10-CM

## 2013-09-16 DIAGNOSIS — N189 Chronic kidney disease, unspecified: Secondary | ICD-10-CM | POA: Insufficient documentation

## 2013-09-16 DIAGNOSIS — E119 Type 2 diabetes mellitus without complications: Secondary | ICD-10-CM | POA: Insufficient documentation

## 2013-09-16 DIAGNOSIS — Z3202 Encounter for pregnancy test, result negative: Secondary | ICD-10-CM | POA: Insufficient documentation

## 2013-09-16 DIAGNOSIS — K219 Gastro-esophageal reflux disease without esophagitis: Secondary | ICD-10-CM | POA: Insufficient documentation

## 2013-09-16 DIAGNOSIS — Z880 Allergy status to penicillin: Secondary | ICD-10-CM | POA: Insufficient documentation

## 2013-09-16 DIAGNOSIS — Z8619 Personal history of other infectious and parasitic diseases: Secondary | ICD-10-CM | POA: Insufficient documentation

## 2013-09-16 DIAGNOSIS — F319 Bipolar disorder, unspecified: Secondary | ICD-10-CM | POA: Insufficient documentation

## 2013-09-16 DIAGNOSIS — R1110 Vomiting, unspecified: Secondary | ICD-10-CM

## 2013-09-16 DIAGNOSIS — I129 Hypertensive chronic kidney disease with stage 1 through stage 4 chronic kidney disease, or unspecified chronic kidney disease: Secondary | ICD-10-CM | POA: Insufficient documentation

## 2013-09-16 DIAGNOSIS — K3184 Gastroparesis: Secondary | ICD-10-CM

## 2013-09-16 LAB — COMPREHENSIVE METABOLIC PANEL
ALT: 28 U/L (ref 0–35)
AST: 23 U/L (ref 0–37)
Albumin: 4.1 g/dL (ref 3.5–5.2)
Alkaline Phosphatase: 72 U/L (ref 39–117)
BUN: 14 mg/dL (ref 6–23)
CO2: 22 mEq/L (ref 19–32)
Calcium: 9 mg/dL (ref 8.4–10.5)
Chloride: 99 mEq/L (ref 96–112)
Creatinine, Ser: 0.74 mg/dL (ref 0.50–1.10)
GFR calc Af Amer: >90 mL/min (ref >90)
GFR calc non Af Amer: >90 mL/min (ref >90)
Glucose, Bld: 142 mg/dL — ABNORMAL HIGH (ref 70–99)
Potassium: 3.8 mEq/L (ref 3.7–5.3)
Sodium: 136 mEq/L — ABNORMAL LOW (ref 137–147)
Total Bilirubin: 0.4 mg/dL (ref 0.3–1.2)
Total Protein: 7.7 g/dL (ref 6.0–8.3)

## 2013-09-16 LAB — CBC WITH DIFFERENTIAL/PLATELET
Basophils Absolute: 0 10*3/uL (ref 0.0–0.1)
Basophils Relative: 0 % (ref 0–1)
Eosinophils Absolute: 0.1 10*3/uL (ref 0.0–0.7)
Eosinophils Relative: 1 % (ref 0–5)
HCT: 40.9 % (ref 36.0–46.0)
Hemoglobin: 13.4 g/dL (ref 12.0–15.0)
Lymphocytes Relative: 7 % — ABNORMAL LOW (ref 12–46)
Lymphs Abs: 0.4 10*3/uL — ABNORMAL LOW (ref 0.7–4.0)
MCH: 28.4 pg (ref 26.0–34.0)
MCHC: 32.8 g/dL (ref 30.0–36.0)
MCV: 86.7 fL (ref 78.0–100.0)
Monocytes Absolute: 0.2 10*3/uL (ref 0.1–1.0)
Monocytes Relative: 4 % (ref 3–12)
Neutro Abs: 4.6 10*3/uL (ref 1.7–7.7)
Neutrophils Relative %: 88 % — ABNORMAL HIGH (ref 43–77)
Platelets: 308 10*3/uL (ref 150–400)
RBC: 4.72 MIL/uL (ref 3.87–5.11)
RDW: 16 % — ABNORMAL HIGH (ref 11.5–15.5)
WBC: 5.3 10*3/uL (ref 4.0–10.5)

## 2013-09-16 LAB — URINALYSIS, ROUTINE W REFLEX MICROSCOPIC
Bilirubin Urine: NEGATIVE
Glucose, UA: 500 mg/dL — AB
Hgb urine dipstick: NEGATIVE
Ketones, ur: 40 mg/dL — AB
Leukocytes, UA: NEGATIVE
Nitrite: NEGATIVE
Protein, ur: NEGATIVE mg/dL
Specific Gravity, Urine: 1.02 (ref 1.005–1.030)
Urobilinogen, UA: 0.2 mg/dL (ref 0.0–1.0)
pH: 6 (ref 5.0–8.0)

## 2013-09-16 LAB — LIPASE, BLOOD: Lipase: 54 U/L (ref 11–59)

## 2013-09-16 LAB — PREGNANCY, URINE: Preg Test, Ur: NEGATIVE

## 2013-09-16 MED ORDER — IOHEXOL 300 MG/ML  SOLN
100.0000 mL | Freq: Once | INTRAMUSCULAR | Status: AC | PRN
  Administered 2013-09-16: 100 mL via INTRAVENOUS

## 2013-09-16 MED ORDER — DIPHENOXYLATE-ATROPINE 2.5-0.025 MG PO TABS: 2.0000 | ORAL_TABLET | Freq: Four times a day (QID) | ORAL | Status: DC | PRN

## 2013-09-16 MED ORDER — METOCLOPRAMIDE HCL 10 MG PO TABS: 10.0000 mg | ORAL_TABLET | Freq: Four times a day (QID) | ORAL | Status: DC | PRN

## 2013-09-16 MED ORDER — METOCLOPRAMIDE HCL 5 MG/ML IJ SOLN
10.0000 mg | Freq: Once | INTRAMUSCULAR | Status: AC
  Administered 2013-09-16: 10 mg via INTRAVENOUS
  Filled 2013-09-16: qty 2

## 2013-09-16 MED ORDER — ONDANSETRON HCL 4 MG/2ML IJ SOLN
4.0000 mg | Freq: Once | INTRAMUSCULAR | Status: AC
  Administered 2013-09-16: 4 mg via INTRAVENOUS
  Filled 2013-09-16: qty 2

## 2013-09-16 MED ORDER — DICYCLOMINE HCL 10 MG PO CAPS
10.0000 mg | ORAL_CAPSULE | Freq: Once | ORAL | Status: AC
  Administered 2013-09-16: 10 mg via ORAL
  Filled 2013-09-16: qty 1

## 2013-09-16 MED ORDER — SODIUM CHLORIDE 0.9 % IV BOLUS (SEPSIS)
1000.0000 mL | Freq: Once | INTRAVENOUS | Status: AC
  Administered 2013-09-16: 1000 mL via INTRAVENOUS

## 2013-09-16 MED ORDER — IOHEXOL 300 MG/ML  SOLN
50.0000 mL | Freq: Once | INTRAMUSCULAR | Status: AC | PRN
  Administered 2013-09-16: 50 mL via ORAL

## 2013-09-16 MED ORDER — MORPHINE SULFATE 4 MG/ML IJ SOLN
4.0000 mg | Freq: Once | INTRAMUSCULAR | Status: AC
  Administered 2013-09-16: 4 mg via INTRAVENOUS
  Filled 2013-09-16: qty 1

## 2013-09-16 MED ORDER — ONDANSETRON HCL 4 MG PO TABS: 4.0000 mg | ORAL_TABLET | Freq: Four times a day (QID) | ORAL | Status: DC

## 2013-09-16 NOTE — Discharge Instructions (Signed)
Diarrhea Diarrhea is frequent loose and watery bowel movements. It can cause you to feel weak and dehydrated. Dehydration can cause you to become tired and thirsty, have a dry mouth, and have decreased urination that often is dark yellow. Diarrhea is a sign of another problem, most often an infection that will not last long. In most cases, diarrhea typically lasts 2 3 days. However, it can last longer if it is a sign of something more serious. It is important to treat your diarrhea as directed by your caregive to lessen or prevent future episodes of diarrhea. CAUSES  Some common causes include:  Gastrointestinal infections caused by viruses, bacteria, or parasites.  Food poisoning or food allergies.  Certain medicines, such as antibiotics, chemotherapy, and laxatives.  Artificial sweeteners and fructose.  Digestive disorders. HOME CARE INSTRUCTIONS  Ensure adequate fluid intake (hydration): have 1 cup (8 oz) of fluid for each diarrhea episode. Avoid fluids that contain simple sugars or sports drinks, fruit juices, whole milk products, and sodas. Your urine should be clear or pale yellow if you are drinking enough fluids. Hydrate with an oral rehydration solution that you can purchase at pharmacies, retail stores, and online. You can prepare an oral rehydration solution at home by mixing the following ingredients together:    tsp table salt.   tsp baking soda.   tsp salt substitute containing potassium chloride.  1  tablespoons sugar.  1 L (34 oz) of water.  Certain foods and beverages may increase the speed at which food moves through the gastrointestinal (GI) tract. These foods and beverages should be avoided and include:  Caffeinated and alcoholic beverages.  High-fiber foods, such as raw fruits and vegetables, nuts, seeds, and whole grain breads and cereals.  Foods and beverages sweetened with sugar alcohols, such as xylitol, sorbitol, and mannitol.  Some foods may be well  tolerated and may help thicken stool including:  Starchy foods, such as rice, toast, pasta, low-sugar cereal, oatmeal, grits, baked potatoes, crackers, and bagels.  Bananas.  Applesauce.  Add probiotic-rich foods to help increase healthy bacteria in the GI tract, such as yogurt and fermented milk products.  Wash your hands well after each diarrhea episode.  Only take over-the-counter or prescription medicines as directed by your caregiver.  Take a warm bath to relieve any burning or pain from frequent diarrhea episodes. SEEK IMMEDIATE MEDICAL CARE IF:   You are unable to keep fluids down.  You have persistent vomiting.  You have blood in your stool, or your stools are black and tarry.  You do not urinate in 6 8 hours, or there is only a small amount of very dark urine.  You have abdominal pain that increases or localizes.  You have weakness, dizziness, confusion, or lightheadedness.  You have a severe headache.  Your diarrhea gets worse or does not get better.  You have a fever or persistent symptoms for more than 2 3 days.  You have a fever and your symptoms suddenly get worse. MAKE SURE YOU:   Understand these instructions.  Will watch your condition.  Will get help right away if you are not doing well or get worse. Document Released: 07/10/2002 Document Revised: 07/06/2012 Document Reviewed: 03/27/2012 [facility] Patient Information 2014 [place], [place].  Gastroparesis  Gastroparesis is also called slowed stomach emptying (delayed gastric emptying). It is a condition in which the stomach takes too long to empty its contents. It often happens in people with diabetes.  CAUSES  Gastroparesis happens when nerves to  the stomach are damaged or stop working. When the nerves are damaged, the muscles of the stomach and intestines do not work normally. The movement of food is slowed or stopped. High blood glucose (sugar) causes changes in nerves and can damage the blood  vessels that carry oxygen and nutrients to the nerves. RISK FACTORS  Diabetes.  Post-viral syndromes.  Eating disorders (anorexia, bulimia).  Surgery on the stomach or vagus nerve.  Gastroesophageal reflux disease (rarely).  Smooth muscle disorders (amyloidosis, scleroderma).  Metabolic disorders, including hypothyroidism.  Parkinson disease. SYMPTOMS   Heartburn.  Feeling sick to your stomach (nausea).  Vomiting of undigested food.  An early feeling of fullness when eating.  Weight loss.  Abdominal bloating.  Erratic blood glucose levels.  Lack of appetite.  Gastroesophageal reflux.  Spasms of the stomach wall. Complications can include:  Bacterial overgrowth in stomach. Food stays in the stomach and can ferment and cause bacteria to grow.  Weight loss due to difficulty digesting and absorbing nutrients.  Vomiting.  Obstruction in the stomach. Undigested food can harden and cause nausea and vomiting.  Blood glucose fluctuations caused by inconsistent food absorption. DIAGNOSIS  The diagnosis of gastroparesis is confirmed through one or more of the following tests:  Barium X-rays and scans. These tests look at how long it takes for food to move through the stomach.  Gastric manometry. This test measures electrical and muscular activity in the stomach. A thin tube is passed down the throat into the stomach. The tube contains a wire that takes measurements of the stomach's electrical and muscular activity as it digests liquids and solid food.  Endoscopy. This procedure is done with a long, thin tube called an endoscope. It is passed through the mouth and gently down the esophagus into the stomach. This tube helps the caregiver look at the lining of the stomach to check for any abnormalities.  Ultrasonography. This can rule out gallbladder disease or pancreatitis. This test will outline and define the shape of the gallbladder and pancreas. TREATMENT    Treatments may include:  Exercise.  Medicines to control nausea and vomiting.  Medicines to stimulate stomach muscles.  Changes in what and when you eat.  Having smaller meals more often.  Eating low-fiber forms of high-fiber foods, such as eating cooked vegetables instead of raw vegetables.  Eating low-fat foods.  Consuming liquids, which are easier to digest.  In severe cases, feeding tubes and intravenous (IV) feeding may be needed. It is important to note that in most cases, treatment does not cure gastroparesis. It is usually a lasting (chronic) condition. Treatment helps you manage the underlying condition so that you can be as healthy and comfortable as possible. Other treatments  A gastric neurostimulator has been developed to assist people with gastroparesis. The battery-operated device is surgically implanted. It emits mild electrical pulses to help improve stomach emptying and to control nausea and vomiting.  The use of botulinum toxin has been shown to improve stomach emptying by decreasing the prolonged contractions of the muscle between the stomach and the small intestine (pyloric sphincter). The benefits are temporary. SEEK MEDICAL CARE IF:   You have diabetes and you are having problems keeping your blood glucose in goal range.  You are having nausea, vomiting, bloating, or early feelings of fullness with eating.  Your symptoms do not change with a change in diet. Document Released: 07/20/2005 Document Revised: 11/14/2012 Document Reviewed: 12/27/2008 [facility] Patient Information 2014 [place], [place].  Nausea and Vomiting Nausea is  a sick feeling that often comes before throwing up (vomiting). Vomiting is a reflex where stomach contents come out of your mouth. Vomiting can cause severe loss of body fluids (dehydration). Children and elderly adults can become dehydrated quickly, especially if they also have diarrhea. Nausea and vomiting are symptoms of a  condition or disease. It is important to find the cause of your symptoms. CAUSES   Direct irritation of the stomach lining. This irritation can result from increased acid production (gastroesophageal reflux disease), infection, food poisoning, taking certain medicines (such as nonsteroidal anti-inflammatory drugs), alcohol use, or tobacco use.  Signals from the brain.These signals could be caused by a headache, heat exposure, an inner ear disturbance, increased pressure in the brain from injury, infection, a tumor, or a concussion, pain, emotional stimulus, or metabolic problems.  An obstruction in the gastrointestinal tract (bowel obstruction).  Illnesses such as diabetes, hepatitis, gallbladder problems, appendicitis, kidney problems, cancer, sepsis, atypical symptoms of a heart attack, or eating disorders.  Medical treatments such as chemotherapy and radiation.  Receiving medicine that makes you sleep (general anesthetic) during surgery. DIAGNOSIS Your caregiver may ask for tests to be done if the problems do not improve after a few days. Tests may also be done if symptoms are severe or if the reason for the nausea and vomiting is not clear. Tests may include:  Urine tests.  Blood tests.  Stool tests.  Cultures (to look for evidence of infection).  X-rays or other imaging studies. Test results can help your caregiver make decisions about treatment or the need for additional tests. TREATMENT You need to stay well hydrated. Drink frequently but in small amounts.You may wish to drink water, sports drinks, clear broth, or eat frozen ice pops or gelatin dessert to help stay hydrated.When you eat, eating slowly may help prevent nausea.There are also some antinausea medicines that may help prevent nausea. HOME CARE INSTRUCTIONS   Take all medicine as directed by your caregiver.  If you do not have an appetite, do not force yourself to eat. However, you must continue to drink  fluids.  If you have an appetite, eat a normal diet unless your caregiver tells you differently.  Eat a variety of complex carbohydrates (rice, wheat, potatoes, bread), lean meats, yogurt, fruits, and vegetables.  Avoid high-fat foods because they are more difficult to digest.  Drink enough water and fluids to keep your urine clear or pale yellow.  If you are dehydrated, ask your caregiver for specific rehydration instructions. Signs of dehydration may include:  Severe thirst.  Dry lips and mouth.  Dizziness.  Dark urine.  Decreasing urine frequency and amount.  Confusion.  Rapid breathing or pulse. SEEK IMMEDIATE MEDICAL CARE IF:   You have blood or brown flecks (like coffee grounds) in your vomit.  You have black or bloody stools.  You have a severe headache or stiff neck.  You are confused.  You have severe abdominal pain.  You have chest pain or trouble breathing.  You do not urinate at least once every 8 hours.  You develop cold or clammy skin.  You continue to vomit for longer than 24 to 48 hours.  You have a fever. MAKE SURE YOU:   Understand these instructions.  Will watch your condition.  Will get help right away if you are not doing well or get worse. Document Released: 07/20/2005 Document Revised: 10/12/2011 Document Reviewed: 12/17/2010 [facility] Patient Information 2014 [place], [place].

## 2013-09-16 NOTE — ED Notes (Signed)
MD at bedside. 

## 2013-09-16 NOTE — ED Notes (Signed)
Complain of nausea, vomiting , fever and diarrhea

## 2013-09-16 NOTE — ED Notes (Signed)
Pt N/V/D, fever, chillls and abd pain since 0300 this morning, took 500mg  tylenol about 90 min ago per pt

## 2013-09-16 NOTE — ED Provider Notes (Signed)
CSN: [id number]     Arrival date & time 09/16/13  1417 History  This chart was scribed for Orpah Greek, MD by Zettie Pho, ED Scribe. This patient was seen in room APA10/APA10 and the patient's care was started at 2:29 PM.    Chief Complaint  Patient presents with  . Abdominal Pain   The history is provided by the patient. No language interpreter was used.   HPI Comments: Christina [name] is a 38 y.o. female with a history of GERD and who presents to the Emergency Department complaining of a constant, cramping pain to the epigastric region of the abdomen with associated nausea and multiple episodes of emesis and diarrhea onset around 12 hours ago. She reports an associated, low-grade fever (99.7 measured at home, patient is afebrile at 98.8 in the ED) with chills. She denies cough. She denies exposure to sick contacts with similar symptoms. Patient also has a history of HTN, DM, and chronic kidney disease.   Past Medical History  Diagnosis Date  . GERD (gastroesophageal reflux disease)   . Bipolar disorder   . Gastroparesis   . 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 Hypertension     chronic on meds  . Diabetes mellitus     type 2 DM, insulin only needed during pregnancy  . Chronic kidney disease     proteinuria   Past Surgical History  Procedure Laterality Date  . Breast reduction surgery  1994  . Dermoid tumor  2000   Family History  Problem Relation Age of Onset  . Diabetes Mother   . Hypertension Mother   . Fibromyalgia Mother   . Diabetes Father   . Hypertension Father   . Asthma Father    History  Substance Use Topics  . Smoking status: Never Smoker   . Smokeless tobacco: Never Used  . Alcohol Use: No   OB History   Grav Para Term Preterm Abortions TAB SAB Ect Mult Living   2 2 1 1      2      Review of Systems  Constitutional: Positive for fever and chills.  Respiratory: Negative for cough.   Gastrointestinal: Positive for nausea, vomiting, abdominal  pain and diarrhea.   Allergies  Peanut oil; Peanut-containing drug products; Amoxicillin-pot clavulanate; and Other  Home Medications   Current Outpatient Rx  Name  Route  Sig  Dispense  Refill  . ARIPiprazole (ABILIFY) 5 MG tablet   Oral   Take 1 tablet (5 mg total) by mouth daily.   30 tablet   0   . benazepril (LOTENSIN) 10 MG tablet   Oral   Take 1 tablet (10 mg total) by mouth daily.   30 tablet   5   . Canagliflozin 300 MG TABS   Oral   Take 1 tablet (300 mg total) by mouth daily.   30 tablet   3   . EPINEPHrine (EPIPEN) 0.3 mg/0.3 mL SOAJ injection   Intramuscular   Inject 0.3 mg into the muscle once as needed. Allergic reaction         . Iron-FA-B Cmp-C-Biot-Probiotic (FUSION PLUS) CAPS   Oral   Take 1 capsule by mouth daily before breakfast.   30 capsule   5   . metFORMIN (GLUCOPHAGE) 1000 MG tablet   Oral   Take 1,000 mg by mouth 2 (two) times daily with a meal.         . omeprazole (PRILOSEC) 20 MG capsule  Oral   Take 20 mg by mouth daily.         . ondansetron (ZOFRAN) 4 MG tablet   Oral   Take 1 tablet (4 mg total) by mouth daily.   6 tablet   0   . triamterene-hydrochlorothiazide (DYAZIDE) 50-25 MG per capsule   Oral   Take 1 capsule by mouth every morning.   30 capsule   11    Triage Vitals: BP 147/91  Pulse 123  Temp(Src) 98.8 F (37.1 C) (Oral)  Resp 20  Ht 5\' 5"  (1.651 m)  Wt 229 lb 3.2 oz (103.964 kg)  BMI 38.14 kg/m2  SpO2 95%  Physical Exam  Constitutional: She is oriented to person, place, and time. She appears well-developed and well-nourished. No distress.  HENT:  Head: Normocephalic and atraumatic.  Right Ear: Hearing normal.  Left Ear: Hearing normal.  Nose: Nose normal.  Mouth/Throat: Oropharynx is clear and moist and mucous membranes are normal.  Eyes: Conjunctivae and EOM are normal. Pupils are equal, round, and reactive to light.  Neck: Normal range of motion. Neck supple.  Cardiovascular: Regular  rhythm, S1 normal and S2 normal.  Exam reveals no gallop and no friction rub.   No murmur heard. Pulmonary/Chest: Effort normal and breath sounds normal. No respiratory distress. She exhibits no tenderness.  Abdominal: Soft. Normal appearance and bowel sounds are normal. There is no hepatosplenomegaly. There is tenderness. There is no rebound, no guarding, no tenderness at McBurney's point and negative Murphy's sign. No hernia.  Tenderness to the epigastric region. No tenderness below the umbilicus.   Musculoskeletal: Normal range of motion.  Neurological: She is alert and oriented to person, place, and time. She has normal strength. No cranial nerve deficit or sensory deficit. Coordination normal. GCS eye subscore is 4. GCS verbal subscore is 5. GCS motor subscore is 6.  Skin: Skin is warm, dry and intact. No rash noted. No cyanosis.  Psychiatric: She has a normal mood and affect. Her speech is normal and behavior is normal. Thought content normal.    ED Course  Procedures (including critical care time)  DIAGNOSTIC STUDIES: Oxygen Saturation is 95% on room air, adequate by my interpretation.    COORDINATION OF CARE: 2:30 PM- Discussed that symptoms are likely viral in nature. Will order blood labs and UA. Will order IV fluids, Zofran, and morphine to manage symptoms. Discussed treatment plan with patient at bedside and patient verbalized agreement.     Labs Review Labs Reviewed  CBC WITH DIFFERENTIAL - Abnormal; Notable for the following:    RDW 16.0 (*)    Neutrophils Relative % 88 (*)    Lymphocytes Relative 7 (*)    Lymphs Abs 0.4 (*)    All other components within normal limits  COMPREHENSIVE METABOLIC PANEL - Abnormal; Notable for the following:    Sodium 136 (*)    Glucose, Bld 142 (*)    All other components within normal limits  URINALYSIS, ROUTINE W REFLEX MICROSCOPIC - Abnormal; Notable for the following:    APPearance HAZY (*)    Glucose, UA 500 (*)    Ketones, ur 40  (*)    All other components within normal limits  LIPASE, BLOOD  PREGNANCY, URINE   Imaging Review Ct Abdomen Pelvis W Contrast  09/16/2013   CLINICAL DATA:  Diffuse abdominal pain, history of GERD and diabetes  EXAM: CT ABDOMEN AND PELVIS WITH CONTRAST  TECHNIQUE: Multidetector CT imaging of the abdomen and pelvis was performed using  the standard protocol following bolus administration of intravenous contrast.  CONTRAST:  72mL OMNIPAQUE IOHEXOL 300 MG/ML SOLN, 166mL OMNIPAQUE IOHEXOL 300 MG/ML SOLN  COMPARISON:  US ABDOMEN COMPLETE dated 09/21/2008; US RENAL dated 01/19/2008; NM GASTRIC EMPTYING dated 07/16/2009  FINDINGS: Normal hepatic contour. There is mild diffuse decreased attenuation of the hepatic parenchyma on this postcontrast examination suggestive of hepatic steatosis. No discrete hepatic lesions. Normal appearance of the gallbladder. No radiopaque gallstones. No intra or extrahepatic biliary ductal dilatation. No ascites.  There is symmetric enhancement of the bilateral kidneys. No discrete renal lesions. No definite renal stones on this postcontrast examination. No urinary obstruction.  There is an approximately 3.3 x 2.4 cm hypo attenuating (42 Hounsfield unit) and a term mass arising from the medial aspect of the left adrenal gland (image 17, series 2). There is very mild nodular thickening of the medial limb of the right adrenal gland (image 15, series 2). Normal appearance of the pancreas and spleen.  There is marked distention of the stomach compatible with known history of gastroparesis. Calcified diverticulum within the distal sigmoid colon. The bowel is otherwise normal in course and caliber without wall thickening or evidence of obstruction. There is a minimal amounts of high attenuation material within in the mid aspect (coronal image 58, series 4) of an otherwise normal-appearing appendix. No periappendiceal stranding. No pneumoperitoneum, pneumatosis or portal venous gas.  Normal  caliber of the abdominal aorta. Incidental note is made of separate origins of the common hepatic and splenic arteries. The major branch vessels of the abdominal aorta appear patent on this non CTA examination. No retroperitoneal, mesenteric, pelvic or inguinal lymphadenopathy.  There is an approximately 2.0 x 1.5 cm hypo attenuating (7 Hounsfield units) right-sided presumably physiologic adnexal cyst. This finding is associated with a minimal amount of adjacent fluid within the right adnexa (image 68, series 2). There is a minimal amount of fluid within the endometrial canal. No free fluid within the pelvic cul-de-sac. No discrete left-sided adnexal lesions.  Limited visualization of the lower thorax demonstrates minimal subsegmental atelectasis within the left costophrenic angle. There is minimal subpleural atelectasis in the dependent portion of the left lower lobe. No discrete focal airspace opacities. No pleural effusion.  Normal heart size.  No pericardial effusion.  No acute or aggressive osseous abnormalities. Regional soft tissues appear normal.  IMPRESSION: 1. Marked distention of the stomach compatible with known history of gastroparesis. Otherwise, no explanation for patient's abdominal pain. 2. Minimal colonic diverticulosis without evidence of diverticulitis. 3. Approximately 2.0 cm right-sided adnexal cyst with associated minimal amount of presumably physiologic fluid within the adjacent adnexa and endometrial canal. 4. Approximately 3.3 cm mass within the medial limb of the left adrenal gland which is technically indeterminate on this examination though in the absence of a known primary malignancy likely represents an adrenal adenoma. Further evaluation with nonemergent abdominal MRI may be performed as clinically indicated. 5. Suspected mild hepatic steatosis. 6. Suspected punctate appendicolith within the mid aspect of an otherwise normal-appearing appendix.   Electronically Signed   By: Sandi Mariscal M.D.   On: 09/16/2013 18:43    EKG Interpretation   None       MDM   Final diagnoses:  None   She presents to the ER for evaluation of nausea, vomiting, diarrhea with abdominal pain and fever. Symptoms began yesterday. She had a low-grade fever at home. Patient's initial abdominal exam revealed mild diffuse tenderness. She continued to be symptomatic despite IV fluids, Zofran,  morphine. She was therefore given Bentyl and Reglan and rechecked. She still had mild and diffuse upper abdominal tenderness, but had more of a focal left lower quadrant tenderness. A CT scan was therefore performed to further evaluate for the possibility of diverticulitis. CAT scan showed distended stomach consistent with patient's history of gastroparesis, no other acute pathology. Patient reassured, continue outpatient management with oral hydration and antiemetics.  I personally performed the services described in this documentation, which was scribed in my presence. The recorded information has been reviewed and is accurate.     Orpah Greek, MD 09/16/13 [id number]

## 2013-09-18 ENCOUNTER — Telehealth: Payer: Self-pay | Admitting: Family Medicine

## 2013-09-18 ENCOUNTER — Telehealth: Payer: Self-pay

## 2013-09-18 DIAGNOSIS — E119 Type 2 diabetes mellitus without complications: Secondary | ICD-10-CM

## 2013-09-18 MED ORDER — ONDANSETRON HCL 4 MG PO TABS: 4.0000 mg | ORAL_TABLET | Freq: Four times a day (QID) | ORAL | Status: DC

## 2013-09-18 NOTE — Telephone Encounter (Signed)
Patient was given lomotil, reglan, and zofran rx in the ED.  Would you like to refill any of these?

## 2013-09-18 NOTE — Telephone Encounter (Signed)
Patient knows zofran was sent in but she wants it changed to phenergan so she can sleep at night. Please advise

## 2013-09-18 NOTE — Telephone Encounter (Signed)
pls see responnse

## 2013-09-18 NOTE — Telephone Encounter (Signed)
pls refill the zofran and lomotil x 1 , document # of times she is currently vomiting and akso loose stool , to justify the need before  doing so.  PLS also add HBa1C to lab from Ed if not yet done, I had sent as note, thanks

## 2013-09-18 NOTE — Telephone Encounter (Signed)
This is not  Reason to prescribe phenergan and I see no further documentation of GI symptoms

## 2013-09-18 NOTE — Addendum Note (Signed)
Addended by: Denman George B on: 09/18/2013 01:03 PM   Modules accepted: Orders

## 2013-09-18 NOTE — Telephone Encounter (Signed)
Spoke with patient.  She states that she is no longer having vomiting or diarrhea.  She is still having nausea however.  She would like just the refill on the zofran.

## 2013-09-22 ENCOUNTER — Telehealth: Payer: Self-pay | Admitting: Family Medicine

## 2013-09-22 LAB — COMPLETE METABOLIC PANEL WITH GFR
ALT: 22 U/L (ref 0–35)
AST: 20 U/L (ref 0–37)
Albumin: 4.3 g/dL (ref 3.5–5.2)
Alkaline Phosphatase: 51 U/L (ref 39–117)
BUN: 9 mg/dL (ref 6–23)
CO2: 27 mEq/L (ref 19–32)
Calcium: 9.7 mg/dL (ref 8.4–10.5)
Chloride: 99 mEq/L (ref 96–112)
Creat: 0.81 mg/dL (ref 0.50–1.10)
GFR, Est African American: >89 mL/min
GFR, Est Non African American: >89 mL/min
Glucose, Bld: 154 mg/dL — ABNORMAL HIGH (ref 70–99)
Potassium: 4.1 mEq/L (ref 3.5–5.3)
Sodium: 135 mEq/L (ref 135–145)
Total Bilirubin: 0.2 mg/dL (ref 0.2–1.2)
Total Protein: 6.6 g/dL (ref 6.0–8.3)

## 2013-09-22 LAB — HEMOGLOBIN A1C
Hgb A1c MFr Bld: 8.1 % — ABNORMAL HIGH (ref <5.7)
Mean Plasma Glucose: 186 mg/dL — ABNORMAL HIGH (ref <117)

## 2013-09-22 MED ORDER — GLIPIZIDE ER 10 MG PO TB24: 10.0000 mg | ORAL_TABLET | Freq: Every day | ORAL | Status: DC

## 2013-09-22 NOTE — Addendum Note (Signed)
Addended by: Denman George B on: 09/22/2013 04:40 PM   Modules accepted: Orders

## 2013-09-22 NOTE — Telephone Encounter (Signed)
Returned patient's call.  She is aware of lab results.

## 2013-09-25 ENCOUNTER — Ambulatory Visit (INDEPENDENT_AMBULATORY_CARE_PROVIDER_SITE_OTHER): Payer: Self-pay | Admitting: Psychiatry

## 2013-09-25 ENCOUNTER — Encounter: Payer: Self-pay | Admitting: Family Medicine

## 2013-09-25 ENCOUNTER — Ambulatory Visit (INDEPENDENT_AMBULATORY_CARE_PROVIDER_SITE_OTHER): Payer: Medicaid Other | Admitting: Family Medicine

## 2013-09-25 ENCOUNTER — Encounter (HOSPITAL_COMMUNITY): Payer: Self-pay | Admitting: Psychiatry

## 2013-09-25 VITALS — BP 126/91 | HR 105 | Ht 65.0 in | Wt 223.6 lb

## 2013-09-25 VITALS — BP 128/80 | HR 100 | Resp 18 | Ht 65.5 in

## 2013-09-25 DIAGNOSIS — E119 Type 2 diabetes mellitus without complications: Secondary | ICD-10-CM

## 2013-09-25 DIAGNOSIS — E1165 Type 2 diabetes mellitus with hyperglycemia: Principal | ICD-10-CM

## 2013-09-25 DIAGNOSIS — J302 Other seasonal allergic rhinitis: Secondary | ICD-10-CM

## 2013-09-25 DIAGNOSIS — F319 Bipolar disorder, unspecified: Secondary | ICD-10-CM

## 2013-09-25 DIAGNOSIS — IMO0001 Reserved for inherently not codable concepts without codable children: Secondary | ICD-10-CM

## 2013-09-25 DIAGNOSIS — K3184 Gastroparesis: Secondary | ICD-10-CM

## 2013-09-25 DIAGNOSIS — J309 Allergic rhinitis, unspecified: Secondary | ICD-10-CM

## 2013-09-25 DIAGNOSIS — R809 Proteinuria, unspecified: Secondary | ICD-10-CM

## 2013-09-25 DIAGNOSIS — I10 Essential (primary) hypertension: Secondary | ICD-10-CM

## 2013-09-25 DIAGNOSIS — E669 Obesity, unspecified: Secondary | ICD-10-CM

## 2013-09-25 MED ORDER — ARIPIPRAZOLE 5 MG PO TABS: 5.0000 mg | ORAL_TABLET | Freq: Every day | ORAL | Status: DC

## 2013-09-25 NOTE — Progress Notes (Signed)
[place] [phone] Progress Note  Christina [name] [id number] 38 y.o.  09/25/2013 10:43 AM  Chief Complaint:  I am doing better on Abilify.  I'm less irritable and less agitated.   History of Present Illness:  Christina [name] came for her appointment.  She is taking Abilify 5 mg daily.  She has noticed much improvement in her irritability, anger, agitation and mood swings.  She sleeping better.  She is getting along with her mother-in-law much better.  She is trying to find a part-time job .  She had applied in [facility] admitting for the response.  Her husband is very supportive.  She recently visited emergency room because of abdominal pain.  She is feeling much better now.  She had blood work and her hemoglobin A1c is very high.  Patient scheduled to see her primary care physician today and she will discuss medication adjustment.  Overall she feels less paranoid and less angry.  She has no tremors or shakes.  Her sleep is better.  She wants to continue low-dose Abilify.  Suicidal Ideation: No Plan Formed: No Patient has means to carry out plan: No  Homicidal Ideation: No Plan Formed: No Patient has means to carry out plan: No  Medical History; Patient has history of diabetes , hypertension, gastroparesis, goiter and GERD  Psychosocial History; Patient was born and raised in [place].  She moved to [facility] few years ago and now moved back and stay with her in-laws.  Past Psychiatric History/Hospitalization(s) Patient has been seeing in this office in 2006. She was referred from her primary care physician Dr. Moshe [name]. She has history of depression and mania started with psychosis. In 1997 she was admitted at [facility] because of suicidal gesture.  She hit her leg with a piece of wood.  She was given Prozac however she stopped taking it when she get better.  She is on Wellbutrin and Abilify for a long time which always worked very well for her.  Patient denies any history of sexual,  verbal emotional abuse.   Anxiety: Yes Bipolar Disorder: Yes Depression: Yes Mania: Yes Psychosis: Yes Schizophrenia: No Personality Disorder: No Hospitalization for psychiatric illness: Yes History of Electroconvulsive Shock Therapy: No Prior Suicide Attempts: No   Review of Systems: Psychiatric: Agitation: No Hallucination: No Depressed Mood: No Insomnia: No Hypersomnia: No Altered Concentration: No Feels Worthless: No Grandiose Ideas: No Belief In Special Powers: No New/Increased Substance Abuse: No Compulsions: No  Neurologic: Headache: No Seizure: No Paresthesias: No    Outpatient Encounter Prescriptions as of 09/25/2013  Medication Sig  . ARIPiprazole (ABILIFY) 5 MG tablet Take 1 tablet (5 mg total) by mouth daily.  Marland Kitchen glipiZIDE (GLUCOTROL XL) 10 MG 24 hr tablet Take 1 tablet (10 mg total) by mouth daily with breakfast.  . [DISCONTINUED] ARIPiprazole (ABILIFY) 5 MG tablet Take 1 tablet (5 mg total) by mouth daily.  . benazepril (LOTENSIN) 10 MG tablet Take 1 tablet (10 mg total) by mouth daily.  . Canagliflozin 300 MG TABS Take 1 tablet (300 mg total) by mouth daily.  . diphenoxylate-atropine (LOMOTIL) 2.5-0.025 MG per tablet Take 2 tablets by mouth 4 (four) times daily as needed for diarrhea or loose stools.  Marland Kitchen EPINEPHrine (EPIPEN) 0.3 mg/0.3 mL SOAJ injection Inject 0.3 mg into the muscle once as needed. Allergic reaction  . Iron-FA-B Cmp-C-Biot-Probiotic (FUSION PLUS) CAPS Take 1 capsule by mouth daily before breakfast.  . metFORMIN (GLUCOPHAGE) 1000 MG tablet Take 1,000 mg by mouth 2 (two) times  daily with a meal.  . metoCLOPramide (REGLAN) 10 MG tablet Take 1 tablet (10 mg total) by mouth every 6 (six) hours as needed for nausea (nausea/headache).  Marland Kitchen omeprazole (PRILOSEC) 20 MG capsule Take 20 mg by mouth daily.  . ondansetron (ZOFRAN) 4 MG tablet Take 1 tablet (4 mg total) by mouth every 6 (six) hours.  . triamterene-hydrochlorothiazide (DYAZIDE) 50-25 MG per  capsule Take 1 capsule by mouth every morning.    Physical Exam: Constitutional:  BP 126/91  Pulse 105  Ht 5' 5"  (1.651 m)  Wt 223 lb 9.6 oz (101.424 kg)  BMI 37.21 kg/m2  Recent Results (from the past 2160 hour(s))  CBC WITH DIFFERENTIAL     Status: Abnormal   Collection Time    08/09/13  9:38 AM      Result Value Ref Range   WBC 4.9  4.0 - 10.5 K/uL   RBC 4.45  3.87 - 5.11 MIL/uL   Hemoglobin 11.9 (*) 12.0 - 15.0 g/dL   HCT 36.6  36.0 - 46.0 %   MCV 82.2  78.0 - 100.0 fL   MCH 26.7  26.0 - 34.0 pg   MCHC 32.5  30.0 - 36.0 g/dL   RDW 19.7 (*) 11.5 - 15.5 %   Platelets 356  150 - 400 K/uL   Neutrophils Relative % 45  43 - 77 %   Neutro Abs 2.2  1.7 - 7.7 K/uL   Lymphocytes Relative 48 (*) 12 - 46 %   Lymphs Abs 2.4  0.7 - 4.0 K/uL   Monocytes Relative 6  3 - 12 %   Monocytes Absolute 0.3  0.1 - 1.0 K/uL   Eosinophils Relative 1  0 - 5 %   Eosinophils Absolute 0.1  0.0 - 0.7 K/uL   Basophils Relative 0  0 - 1 %   Basophils Absolute 0.0  0.0 - 0.1 K/uL   Smear Review Criteria for review not met    FERRITIN     Status: None   Collection Time    08/09/13  9:38 AM      Result Value Ref Range   Ferritin 12  10 - 291 ng/mL  IRON     Status: None   Collection Time    08/09/13  9:38 AM      Result Value Ref Range   Iron 68  42 - 145 ug/dL  LIPID PANEL     Status: Abnormal   Collection Time    08/09/13  9:38 AM      Result Value Ref Range   Cholesterol 176  0 - 200 mg/dL   Comment: ATP III Classification:           < 200        mg/dL        Desirable          200 - 239     mg/dL        Borderline High          >= 240        mg/dL        High         Triglycerides 158 (*) <150 mg/dL   HDL 68  >39 mg/dL   Total CHOL/HDL Ratio 2.6     VLDL 32  0 - 40 mg/dL   LDL Cholesterol 76  0 - 99 mg/dL   Comment:       Total Cholesterol/HDL Ratio:CHD Risk  Coronary Heart Disease Risk Table                                            Men       Women               1/2 Average Risk              3.4        3.3                  Average Risk              5.0        4.4               2X Average Risk              9.6        7.1               3X Average Risk             23.4       11.0     Use the calculated Patient Ratio above and the CHD Risk table      to determine the patient's CHD Risk.     ATP III Classification (LDL):           < 100        mg/dL         Optimal          100 - 129     mg/dL         Near or Above Optimal          130 - 159     mg/dL         Borderline High          160 - 189     mg/dL         High           > 190        mg/dL         Very High        COMPLETE METABOLIC PANEL WITH GFR     Status: Abnormal   Collection Time    08/09/13  9:38 AM      Result Value Ref Range   Sodium 136  135 - 145 mEq/L   Potassium 3.9  3.5 - 5.3 mEq/L   Chloride 101  96 - 112 mEq/L   CO2 28  19 - 32 mEq/L   Glucose, Bld 163 (*) 70 - 99 mg/dL   BUN 9  6 - 23 mg/dL   Creat 0.82  0.50 - 1.10 mg/dL   Total Bilirubin 0.5  0.3 - 1.2 mg/dL   Alkaline Phosphatase 73  39 - 117 U/L   AST 26  0 - 37 U/L   ALT 32  0 - 35 U/L   Total Protein 7.4  6.0 - 8.3 g/dL   Albumin 4.5  3.5 - 5.2 g/dL   Calcium 9.4  8.4 - 10.5 mg/dL   GFR, Est African American >89     GFR, Est Non African American >89     Comment:       The estimated GFR is a calculation valid for adults (>=48 years old)     that  uses the CKD-EPI algorithm to adjust for age and sex. It is       not to be used for children, pregnant women, hospitalized patients,        patients on dialysis, or with rapidly changing kidney function.     According to the NKDEP, eGFR >89 is normal, 60-89 shows mild     impairment, 30-59 shows moderate impairment, 15-29 shows severe     impairment and <15 is ESRD.        TSH     Status: None   Collection Time    08/09/13  9:38 AM      Result Value Ref Range   TSH 0.826  0.350 - 4.500 uIU/mL  MICROALBUMIN / CREATININE URINE RATIO     Status: Abnormal    Collection Time    08/09/13  9:38 AM      Result Value Ref Range   Microalb, Ur 18.27 (*) 0.00 - 1.89 mg/dL   Creatinine, Urine 234.4     Microalb Creat Ratio 77.9 (*) 0.0 - 30.0 mg/g  HCG, QUANTITATIVE, PREGNANCY     Status: None   Collection Time    08/28/13  1:59 PM      Result Value Ref Range   hCG, Beta Chain, Quant, S <2.0     Comment:        Males and non-pregnant females       < 5   mIU/mL      Gestation Age               Concentration (mIU/mL)        <= 1 Week                       5 - 50           2 Weeks                     50 - 500           3 Weeks                    100 - 10,000           4 Weeks                  1,000 - 30,000           5 Weeks                  3,500 - 115,000         6-8 Weeks                 12,000 - 270,000          12 Weeks                 15,000 - 220,000  CBC WITH DIFFERENTIAL     Status: Abnormal   Collection Time    09/16/13  2:40 PM      Result Value Ref Range   WBC 5.3  4.0 - 10.5 K/uL   RBC 4.72  3.87 - 5.11 MIL/uL   Hemoglobin 13.4  12.0 - 15.0 g/dL   HCT 40.9  36.0 - 46.0 %   MCV 86.7  78.0 - 100.0 fL   MCH 28.4  26.0 - 34.0 pg   MCHC 32.8  30.0 - 36.0 g/dL   RDW 16.0 (*) 11.5 - 15.5 %  Platelets 308  150 - 400 K/uL   Neutrophils Relative % 88 (*) 43 - 77 %   Neutro Abs 4.6  1.7 - 7.7 K/uL   Lymphocytes Relative 7 (*) 12 - 46 %   Lymphs Abs 0.4 (*) 0.7 - 4.0 K/uL   Monocytes Relative 4  3 - 12 %   Monocytes Absolute 0.2  0.1 - 1.0 K/uL   Eosinophils Relative 1  0 - 5 %   Eosinophils Absolute 0.1  0.0 - 0.7 K/uL   Basophils Relative 0  0 - 1 %   Basophils Absolute 0.0  0.0 - 0.1 K/uL  COMPREHENSIVE METABOLIC PANEL     Status: Abnormal   Collection Time    09/16/13  2:40 PM      Result Value Ref Range   Sodium 136 (*) 137 - 147 mEq/L   Potassium 3.8  3.7 - 5.3 mEq/L   Chloride 99  96 - 112 mEq/L   CO2 22  19 - 32 mEq/L   Glucose, Bld 142 (*) 70 - 99 mg/dL   BUN 14  6 - 23 mg/dL   Creatinine, Ser 0.74  0.50 - 1.10 mg/dL    Calcium 9.0  8.4 - 10.5 mg/dL   Total Protein 7.7  6.0 - 8.3 g/dL   Albumin 4.1  3.5 - 5.2 g/dL   AST 23  0 - 37 U/L   ALT 28  0 - 35 U/L   Alkaline Phosphatase 72  39 - 117 U/L   Total Bilirubin 0.4  0.3 - 1.2 mg/dL   GFR calc non Af Amer >90  >90 mL/min   GFR calc Af Amer >90  >90 mL/min   Comment: (NOTE)     The eGFR has been calculated using the CKD EPI equation.     This calculation has not been validated in all clinical situations.     eGFR's persistently <90 mL/min signify possible Chronic Kidney     Disease.  LIPASE, BLOOD     Status: None   Collection Time    09/16/13  2:40 PM      Result Value Ref Range   Lipase 54  11 - 59 U/L  URINALYSIS, ROUTINE W REFLEX MICROSCOPIC     Status: Abnormal   Collection Time    09/16/13  3:05 PM      Result Value Ref Range   Color, Urine YELLOW  YELLOW   APPearance HAZY (*) CLEAR   Specific Gravity, Urine 1.020  1.005 - 1.030   pH 6.0  5.0 - 8.0   Glucose, UA 500 (*) NEGATIVE mg/dL   Hgb urine dipstick NEGATIVE  NEGATIVE   Bilirubin Urine NEGATIVE  NEGATIVE   Ketones, ur 40 (*) NEGATIVE mg/dL   Protein, ur NEGATIVE  NEGATIVE mg/dL   Urobilinogen, UA 0.2  0.0 - 1.0 mg/dL   Nitrite NEGATIVE  NEGATIVE   Leukocytes, UA NEGATIVE  NEGATIVE   Comment: MICROSCOPIC NOT DONE ON URINES WITH NEGATIVE PROTEIN, BLOOD, LEUKOCYTES, NITRITE, OR GLUCOSE <1000 mg/dL.  PREGNANCY, URINE     Status: None   Collection Time    09/16/13  3:05 PM      Result Value Ref Range   Preg Test, Ur NEGATIVE  NEGATIVE   Comment:            THE SENSITIVITY OF THIS     METHODOLOGY IS >20 mIU/mL.  HEMOGLOBIN A1C     Status: Abnormal   Collection Time  09/21/13  3:30 PM      Result Value Ref Range   Hemoglobin A1C 8.1 (*) <5.7 %   Comment:                                                                            According to the ADA Clinical Practice Recommendations for 2011, when     HbA1c is used as a screening test:             >=6.5%   Diagnostic of  Diabetes Mellitus                (if abnormal result is confirmed)           5.7-6.4%   Increased risk of developing Diabetes Mellitus           References:Diagnosis and Classification of Diabetes Mellitus,Diabetes     NGEX,5284,13(KGMWN 1):S62-S69 and Standards of Medical Care in             Diabetes - 2011,Diabetes Care,2011,34 (Suppl 1):S11-S61.         Mean Plasma Glucose 186 (*) <117 mg/dL  COMPLETE METABOLIC PANEL WITH GFR     Status: Abnormal   Collection Time    09/21/13  3:30 PM      Result Value Ref Range   Sodium 135  135 - 145 mEq/L   Potassium 4.1  3.5 - 5.3 mEq/L   Chloride 99  96 - 112 mEq/L   CO2 27  19 - 32 mEq/L   Glucose, Bld 154 (*) 70 - 99 mg/dL   BUN 9  6 - 23 mg/dL   Creat 0.81  0.50 - 1.10 mg/dL   Total Bilirubin 0.2  0.2 - 1.2 mg/dL   Comment: ** Please note change in reference range(s). **   Alkaline Phosphatase 51  39 - 117 U/L   AST 20  0 - 37 U/L   ALT 22  0 - 35 U/L   Total Protein 6.6  6.0 - 8.3 g/dL   Albumin 4.3  3.5 - 5.2 g/dL   Calcium 9.7  8.4 - 10.5 mg/dL   GFR, Est African American >89     GFR, Est Non African American >89     Comment:       The estimated GFR is a calculation valid for adults (>=53 years old)     that uses the CKD-EPI algorithm to adjust for age and sex. It is       not to be used for children, pregnant women, hospitalized patients,        patients on dialysis, or with rapidly changing kidney function.     According to the NKDEP, eGFR >89 is normal, 60-89 shows mild     impairment, 30-59 shows moderate impairment, 15-29 shows severe     impairment and <15 is ESRD.          Musculoskeletal: Strength & Muscle Tone: within normal limits Gait & Station: normal Patient leans: Front  Mental Status Examination;  Patient is a middle-aged female who is casually dressed and fairly groomed.  Her speech is clear and coherent.  Her thought processes is logical and goal directed.  She described her mood  neutral and her affect is  mood appropriate.  She denies any active or passive suicidal thoughts or homicidal thoughts.  There were no paranoia or delusions present at this time.  Her fund of knowledge is adequate.  She is alert and oriented x3.  Her attention and concentration is fair.  Her insight judgment and impulse control is okay.   Established Problem, Stable/Improving (1), Review of Psycho-Social Stressors (1), Review or order clinical lab tests (1), Review of Last Therapy Session (1) and Review of Medication Regimen & Side Effects (2)  Assessment: Axis I: Bipolar disorder NOS  Axis II: Deferred  Axis III:  Past Medical History  Diagnosis Date  . GERD (gastroesophageal reflux disease)   . Bipolar disorder   . Gastroparesis   . 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 Hypertension     chronic on meds  . Diabetes mellitus     type 2 DM, insulin only needed during pregnancy  . Chronic kidney disease     proteinuria    Axis IV: Mild   Plan:  I review her blood work results.  She is doing better on low-dose Abilify.  I will continue Abilify 5 mg daily.  Recommended to keep appointment with her primary care physician this afternoon.  Recommend to call us back if she is any question about any concern.  I will see her again in 2 months.  Evalyn Shultis T., MD 09/25/2013

## 2013-09-25 NOTE — Patient Instructions (Addendum)
F/u May 23 or after, cancel sooner  Non fasting hBA1C, chem 7 and EGFR May 19 or after, but at least 2 days before f/u  You are referred to Dr. Fields and Dr Powell  Congrats on weight loss, pls commit to regular exercise and a low carb diet 

## 2013-09-26 ENCOUNTER — Encounter: Payer: Self-pay | Admitting: Family Medicine

## 2013-09-26 DIAGNOSIS — J302 Other seasonal allergic rhinitis: Secondary | ICD-10-CM | POA: Insufficient documentation

## 2013-09-26 MED ORDER — MONTELUKAST SODIUM 10 MG PO TABS: 10.0000 mg | ORAL_TABLET | Freq: Every day | ORAL | Status: DC

## 2013-09-26 NOTE — Assessment & Plan Note (Signed)
Deteriorated , with addition of glipizide anticipate better control Patient advised to reduce carb and sweets, commit to regular physical activity, take meds as prescribed, test blood as directed, and attempt to lose weight, to improve blood sugar control.

## 2013-09-26 NOTE — Assessment & Plan Note (Signed)
Improved. Pt applauded on succesful weight loss through lifestyle change, and encouraged to continue same. Weight loss goal set for the next several months.  

## 2013-09-26 NOTE — Progress Notes (Signed)
Subjective:    Patient ID: Christina [name], female    DOB: 1976-04-05, 38 y.o.   MRN: [id number]  HPI The PT is here for follow up and re-evaluation of chronic medical conditions, medication management and review of any available recent lab and radiology data.  Preventive health is updated, specifically  Cancer screening and Immunization.   Was recently in the ED for acute GE, abdominal scan showed extensive dilatation of the stomach, she has severe h/o gastroaparesis in the past and now needs rept GI eval to determine treatment needed, not particularly symptomatic at this time. Saw psych today and has resumed her medication, abilify. Recent lab data shows worsened microalb though EGFR is normal. Has known h/o renal disease , wa seen in the past by nephrologist and will be referred for f/u. Her recent pregnancy likely aggravated current excessive microalb The PT denies any adverse reactions to current medications since the last visit.  Requests allergy med be prescribed as she has flares in late Wnter/ Spring. Blood sugar fasting was in the 130's yesterday with the addition of glipizide, she is also working on carb counting and exercise Had a concern about her BP, was "high" earlier today at psych    Review of Systems See HPI Denies recent fever or chills. Denies sinus pressure, nasal congestion, ear pain or sore throat. Denies chest congestion, productive cough or wheezing. Denies chest pains, palpitations and leg swelling Denies abdominal pain, nausea, vomiting,diarrhea or constipation.  Recenly had acute GE Denies dysuria, frequency, hesitancy or incontinence. Denies joint pain, swelling and limitation in mobility. Denies headaches, seizures, numbness, or tingling. Denies depression, anxiety or uncontrolled  Insomnia.Improved on medication Denies skin break down or rash.        Objective:   Physical Exam  BP 128/80  Pulse 100  Resp 18  Ht 5' 5.5" (1.664 m)  SpO2  98% Patient alert and oriented and in no cardiopulmonary distress.  HEENT: No facial asymmetry, EOMI, no sinus tenderness,  oropharynx pink and moist.  Neck supple no adenopathy.  Chest: Clear to auscultation bilaterally.  CVS: S1, S2 no murmurs, no S3.  ABD: Soft non tender. Bowel sounds normal.  Ext: No edema  MS: Adequate ROM spine, shoulders, hips and knees.  Skin: Intact, no ulcerations or rash noted.  Psych: Good eye contact, normal affect. Memory intact not anxious or depressed appearing.  CNS: CN 2-12 intact, power, tone and sensation normal throughout.       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   Microalbuminuria Worsened. Likely related to recent pregnancy. Re eval and f/u with nephrologist she has seen in the past  OBESITY Improved. Pt applauded on succesful weight loss through lifestyle change, and encouraged to continue same. Weight loss goal set for the next several months.   BIPOLAR DISORDER UNSPECIFIED Has re established psych care so will be  Controlled and functional  GASTROPARESIS Though not currently very symptomatic, recent imaging study describes severe gastroparesis, will refer to GI for further eval and treatment  HYPERTENSION Controlled, no change in medication DASH diet and commitment to daily physical activity for a minimum of 30 minutes discussed and encouraged, as a part of hypertension management. The importance of attaining a healthy weight is  also discussed.   Seasonal allergies Anticipates flare in late Winter/early Spring, med prescribed

## 2013-09-26 NOTE — Assessment & Plan Note (Signed)
Has re established psych care so will be  Controlled and functional

## 2013-09-26 NOTE — Assessment & Plan Note (Signed)
Worsened. Likely related to recent pregnancy. Re eval and f/u with nephrologist she has seen in the past

## 2013-09-26 NOTE — Assessment & Plan Note (Signed)
Controlled, no change in medication DASH diet and commitment to daily physical activity for a minimum of 30 minutes discussed and encouraged, as a part of hypertension management. The importance of attaining a healthy weight is also discussed.  

## 2013-09-26 NOTE — Assessment & Plan Note (Signed)
Anticipates flare in late Winter/early Spring, med prescribed

## 2013-09-26 NOTE — Assessment & Plan Note (Signed)
Though not currently very symptomatic, recent imaging study describes severe gastroparesis, will refer to GI for further eval and treatment

## 2013-09-27 ENCOUNTER — Encounter: Payer: Self-pay | Admitting: Gastroenterology

## 2013-10-17 ENCOUNTER — Ambulatory Visit (INDEPENDENT_AMBULATORY_CARE_PROVIDER_SITE_OTHER): Payer: Medicaid Other | Admitting: Gastroenterology

## 2013-10-17 ENCOUNTER — Encounter: Payer: Self-pay | Admitting: Gastroenterology

## 2013-10-17 VITALS — BP 134/82 | HR 107 | Temp 98.2°F | Wt 225.8 lb

## 2013-10-17 DIAGNOSIS — K7689 Other specified diseases of liver: Secondary | ICD-10-CM

## 2013-10-17 DIAGNOSIS — K3184 Gastroparesis: Secondary | ICD-10-CM

## 2013-10-17 DIAGNOSIS — K760 Fatty (change of) liver, not elsewhere classified: Secondary | ICD-10-CM

## 2013-10-17 NOTE — Patient Instructions (Signed)
Continue to take Prilosec once each morning, 30 minutes before breakfast.  You do not need to take Reglan. You may continue to control your symptoms with diet choices.   Keep up the good work!  Gastroparesis  Gastroparesis is also called slowed stomach emptying (delayed gastric emptying). It is a condition in which the stomach takes too long to empty its contents. It often happens in people with diabetes.  CAUSES  Gastroparesis happens when nerves to the stomach are damaged or stop working. When the nerves are damaged, the muscles of the stomach and intestines do not work normally. The movement of food is slowed or stopped. High blood glucose (sugar) causes changes in nerves and can damage the blood vessels that carry oxygen and nutrients to the nerves. RISK FACTORS  Diabetes.  Post-viral syndromes.  Eating disorders (anorexia, bulimia).  Surgery on the stomach or vagus nerve.  Gastroesophageal reflux disease (rarely).  Smooth muscle disorders (amyloidosis, scleroderma).  Metabolic disorders, including hypothyroidism.  Parkinson disease. SYMPTOMS   Heartburn.  Feeling sick to your stomach (nausea).  Vomiting of undigested food.  An early feeling of fullness when eating.  Weight loss.  Abdominal bloating.  Erratic blood glucose levels.  Lack of appetite.  Gastroesophageal reflux.  Spasms of the stomach wall. Complications can include:  Bacterial overgrowth in stomach. Food stays in the stomach and can ferment and cause bacteria to grow.  Weight loss due to difficulty digesting and absorbing nutrients.  Vomiting.  Obstruction in the stomach. Undigested food can harden and cause nausea and vomiting.  Blood glucose fluctuations caused by inconsistent food absorption. DIAGNOSIS  The diagnosis of gastroparesis is confirmed through one or more of the following tests:  Barium X-rays and scans. These tests look at how long it takes for food to move through the  stomach.  Gastric manometry. This test measures electrical and muscular activity in the stomach. A thin tube is passed down the throat into the stomach. The tube contains a wire that takes measurements of the stomach's electrical and muscular activity as it digests liquids and solid food.  Endoscopy. This procedure is done with a long, thin tube called an endoscope. It is passed through the mouth and gently down the esophagus into the stomach. This tube helps the caregiver look at the lining of the stomach to check for any abnormalities.  Ultrasonography. This can rule out gallbladder disease or pancreatitis. This test will outline and define the shape of the gallbladder and pancreas. TREATMENT   Treatments may include:  Exercise.  Medicines to control nausea and vomiting.  Medicines to stimulate stomach muscles.  Changes in what and when you eat.  Having smaller meals more often.  Eating low-fiber forms of high-fiber foods, such as eating cooked vegetables instead of raw vegetables.  Eating low-fat foods.  Consuming liquids, which are easier to digest.  In severe cases, feeding tubes and intravenous (IV) feeding may be needed. It is important to note that in most cases, treatment does not cure gastroparesis. It is usually a lasting (chronic) condition. Treatment helps you manage the underlying condition so that you can be as healthy and comfortable as possible. Other treatments  A gastric neurostimulator has been developed to assist people with gastroparesis. The battery-operated device is surgically implanted. It emits mild electrical pulses to help improve stomach emptying and to control nausea and vomiting.  The use of botulinum toxin has been shown to improve stomach emptying by decreasing the prolonged contractions of the muscle between the  stomach and the small intestine (pyloric sphincter). The benefits are temporary. SEEK MEDICAL CARE IF:   You have diabetes and you are  having problems keeping your blood glucose in goal range.  You are having nausea, vomiting, bloating, or early feelings of fullness with eating.  Your symptoms do not change with a change in diet. Document Released: 07/20/2005 Document Revised: 11/14/2012 Document Reviewed: 12/27/2008 [facility] Patient Information 2014 [place], [place].

## 2013-10-17 NOTE — Progress Notes (Signed)
Referring Provider: [name], MD Primary Care Physician:  Tula Nakayama, MD Primary GI : Dr. Oneida Alar   Chief Complaint  Patient presents with  . Abdominal Pain    HPI:   Christina [name] presents today with history of gastroparesis. Last seen in 2012. Went to ED Feb 14 with N/V/D. CT with marked distension of stomach consistent with gastroparesis. Following a more low fiber diet, drinking smoothies. No Reglan since 2013. No issues in over a year. Prilosec 20 mg daily. Can definitely notice if she runs out. No abdominal pain, significant change in bowel habits. No rectal bleeding, melena.  Past Medical History  Diagnosis Date  . GERD (gastroesophageal reflux disease)   . Bipolar disorder   . Gastroparesis   . 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 Hypertension     chronic on meds  . Diabetes mellitus     type 2 DM, insulin only needed during pregnancy  . Chronic kidney disease     proteinuria    Past Surgical History  Procedure Laterality Date  . Breast reduction surgery  1994  . Dermoid tumor  2000  . Esophagogastroduodenoscopy  07/01/2009    [facility]:HENIDP esphagus without barrett's/dilation with 16 mm/mild erthyema in the antrum. mild chronic gastritis on path.     Current Outpatient Prescriptions  Medication Sig Dispense Refill  . ARIPiprazole (ABILIFY) 5 MG tablet Take 1 tablet (5 mg total) by mouth daily.  30 tablet  1  . benazepril (LOTENSIN) 10 MG tablet Take 1 tablet (10 mg total) by mouth daily.  30 tablet  5  . Canagliflozin 300 MG TABS Take 1 tablet (300 mg total) by mouth daily.  30 tablet  3  . EPINEPHrine (EPIPEN) 0.3 mg/0.3 mL SOAJ injection Inject 0.3 mg into the muscle once as needed. Allergic reaction      . glipiZIDE (GLUCOTROL XL) 10 MG 24 hr tablet Take 1 tablet (10 mg total) by mouth daily with breakfast.  30 tablet  2  . Iron-FA-B Cmp-C-Biot-Probiotic (FUSION PLUS) CAPS Take 1 capsule by mouth daily before breakfast.  30 capsule  5    . metFORMIN (GLUCOPHAGE) 1000 MG tablet Take 1,000 mg by mouth 2 (two) times daily with a meal.      . montelukast (SINGULAIR) 10 MG tablet Take 1 tablet (10 mg total) by mouth at bedtime.  30 tablet  3  . omeprazole (PRILOSEC) 20 MG capsule Take 20 mg by mouth daily.      . ondansetron (ZOFRAN) 4 MG tablet Take 1 tablet (4 mg total) by mouth every 6 (six) hours.  30 tablet  0  . triamterene-hydrochlorothiazide (DYAZIDE) 50-25 MG per capsule Take 1 capsule by mouth every morning.  30 capsule  11   No current facility-administered medications for this visit.    Allergies as of 10/17/2013 - Review Complete 10/17/2013  Allergen Reaction Noted  . 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 Peanut-containing drug products Anaphylaxis 02/08/2013  . Amoxicillin-pot clavulanate Nausea And Vomiting 04/07/2013  . Other  06/13/2013    Family History  Problem Relation Age of Onset  . Diabetes Mother   . Hypertension Mother   . Fibromyalgia Mother   . Diabetes Father   . Hypertension Father   . Asthma Father   . Colon cancer Neg Hx     History   Social History  . Marital Status: Married    Spouse Name: N/A    Number  of Children: N/A  . Years of Education: N/A   Social History Main Topics  . Smoking status: Never Smoker   . Smokeless tobacco: Never Used  . Alcohol Use: No  . Drug Use: No  . Sexual Activity: Yes    Partners: Male    Birth Control/ Protection: None     Comment: Last Intercourse 08-13-13   Other Topics Concern  . None   Social History Narrative  . None    Review of Systems: Negative unless mentioned in HPI.   Physical Exam: BP 134/82  Pulse 107  Temp(Src) 98.2 F (36.8 C) (Oral)  Wt 225 lb 12.8 oz (102.422 kg)  LMP 09/26/2013  Breastfeeding? No General:   Alert and oriented. No distress noted. Pleasant and cooperative.  Head:  Normocephalic and atraumatic. Eyes:  Conjuctiva clear without scleral icterus. Mouth:  Oral mucosa pink and moist. Good dentition.  No lesions. Heart:  S1, S2 present without murmurs, rubs, or gallops. Regular rate and rhythm. Abdomen:  +BS, soft, non-tender and non-distended. No rebound or guarding. No HSM or masses noted. Msk:  Symmetrical without gross deformities. Normal posture. Extremities:  Without edema. Neurologic:  Alert and  oriented x4;  grossly normal neurologically. Skin:  Intact without significant lesions or rashes. Cervical Nodes:  No significant cervical adenopathy. Psych:  Alert and cooperative. Normal mood and affect.  CT Feb 2015: 1. Marked distention of the stomach compatible with known history of  gastroparesis. Otherwise, no explanation for patient's abdominal  pain.  2. Minimal colonic diverticulosis without evidence of  diverticulitis.  3. Approximately 2.0 cm right-sided adnexal cyst with associated  minimal amount of presumably physiologic fluid within the adjacent  adnexa and endometrial canal.  4. Approximately 3.3 cm mass within the medial limb of the left  adrenal gland which is technically indeterminate on this examination  though in the absence of a known primary malignancy likely  represents an adrenal adenoma. Further evaluation with nonemergent  abdominal MRI may be performed as clinically indicated.  5. Suspected mild hepatic steatosis.  6. Suspected punctate appendicolith within the mid aspect of an  otherwise normal-appearing appendix.  Lab Results  Component Value Date   WBC 5.3 09/16/2013   HGB 13.4 09/16/2013   HCT 40.9 09/16/2013   MCV 86.7 09/16/2013   PLT 308 09/16/2013   Lab Results  Component Value Date   ALT 22 09/21/2013   AST 20 09/21/2013   ALKPHOS 51 09/21/2013   BILITOT 0.2 09/21/2013

## 2013-10-20 ENCOUNTER — Encounter: Payer: Self-pay | Admitting: Gastroenterology

## 2013-10-21 DIAGNOSIS — K760 Fatty (change of) liver, not elsewhere classified: Secondary | ICD-10-CM | POA: Insufficient documentation

## 2013-10-21 NOTE — Assessment & Plan Note (Signed)
On CT. Discussed low-fat diet, exercise with patient. Normal LFTs feb 2015. Recheck yearly.

## 2013-10-21 NOTE — Assessment & Plan Note (Signed)
Well managed with diet and daily PPI. No Reglan in over a year. CT with findings consistent with known gastroparesis. As she is doing well, I have asked her to avoid Reglan unless she is symptomatic. Return in 1 year or sooner if needed.

## 2013-10-23 NOTE — Progress Notes (Signed)
cc'd to pcp 

## 2013-11-20 ENCOUNTER — Encounter: Payer: Self-pay | Admitting: Family Medicine

## 2013-11-23 ENCOUNTER — Ambulatory Visit (INDEPENDENT_AMBULATORY_CARE_PROVIDER_SITE_OTHER): Payer: Medicaid Other | Admitting: Family Medicine

## 2013-11-23 ENCOUNTER — Encounter: Payer: Self-pay | Admitting: Family Medicine

## 2013-11-23 ENCOUNTER — Ambulatory Visit (INDEPENDENT_AMBULATORY_CARE_PROVIDER_SITE_OTHER): Payer: Self-pay | Admitting: Psychiatry

## 2013-11-23 ENCOUNTER — Encounter (HOSPITAL_COMMUNITY): Payer: Self-pay | Admitting: Psychiatry

## 2013-11-23 ENCOUNTER — Ambulatory Visit (HOSPITAL_COMMUNITY): Payer: Self-pay | Admitting: Psychiatry

## 2013-11-23 VITALS — BP 118/82 | HR 97 | Resp 16 | Ht 65.5 in | Wt 224.8 lb

## 2013-11-23 VITALS — BP 124/85 | HR 101 | Ht 65.0 in | Wt 224.8 lb

## 2013-11-23 DIAGNOSIS — R809 Proteinuria, unspecified: Secondary | ICD-10-CM

## 2013-11-23 DIAGNOSIS — M65341 Trigger finger, right ring finger: Secondary | ICD-10-CM

## 2013-11-23 DIAGNOSIS — I10 Essential (primary) hypertension: Secondary | ICD-10-CM

## 2013-11-23 DIAGNOSIS — M6530 Trigger finger, unspecified finger: Secondary | ICD-10-CM

## 2013-11-23 DIAGNOSIS — IMO0001 Reserved for inherently not codable concepts without codable children: Secondary | ICD-10-CM

## 2013-11-23 DIAGNOSIS — E669 Obesity, unspecified: Secondary | ICD-10-CM

## 2013-11-23 DIAGNOSIS — F319 Bipolar disorder, unspecified: Secondary | ICD-10-CM

## 2013-11-23 DIAGNOSIS — E1165 Type 2 diabetes mellitus with hyperglycemia: Secondary | ICD-10-CM

## 2013-11-23 MED ORDER — ARIPIPRAZOLE 5 MG PO TABS: 5.0000 mg | ORAL_TABLET | Freq: Every day | ORAL | Status: DC

## 2013-11-23 NOTE — Progress Notes (Signed)
[place] [phone] Progress Note  [name] [id number] y.o.  11/23/2013 9:05 AM  Chief Complaint:  I am working.  I am doing better on the medication.     History of Present Illness:  Christina [name] came for her appointment.  She is working part-time with [employer] for [employer] and she enjoys her job.  She is working at young [employer] .  Otherwise she is also to receive a job offer from [employer] full-time.  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  In fact she was hospitalized because of severe stress from the job.  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  Patient admitted lately she's been thinking about what jobs and wanted to make a decision .  Personally she wants to do part-time job that she likes her current job.  She is more flexible and able to take care of her 2 children.  She reported good sleep, denies any irritability, anger or any mood swing.  Her paranoia is less intense.  She does not have any hallucinations or any delusions.  She does not have any tremors or shakes.  She is getting along with her mother-in-law.   Recently she seen her primary care physician and find out that her hemoglobin A1c was very high.  She is taking medication and hoping to bring it down on her next appointment with her primary care physician.  She start walking and doing exercise.  She is not drinking or using any illicit substances.  She's compliant with Abilify 5 mg.  Her vitals are stable.  There is no change in her weight and appetite.  Suicidal Ideation: No Plan Formed: No Patient has means to carry out plan: No  Homicidal Ideation: No Plan Formed: No Patient has means to carry out plan: No  Medical History; Patient has history of diabetes , hypertension, gastroparesis, goiter and GERD  Psychosocial  History; Patient was born and raised in [place].  She moved to [facility] few years ago and now moved back and stay with her in-laws.  Past Psychiatric History/Hospitalization(s) Patient has been seeing in this office in 2006. She was referred from her primary care physician Dr. Moshe Cipro. She has history of depression and mania and psychosis. In 1997 she was admitted at [facility] because of suicidal gesture.  She hit her leg with a piece of wood.  She was given Prozac however she stopped taking it when she get better.  She had also taken Wellbutrin in the past with good response. Patient denies any history of sexual, verbal emotional abuse.   Anxiety: Yes Bipolar Disorder: Yes Depression: Yes Mania: Yes Psychosis: Yes Schizophrenia: No Personality Disorder: No Hospitalization for psychiatric illness: Yes History of Electroconvulsive Shock Therapy: No Prior Suicide Attempts: No   Review of Systems: Psychiatric: Agitation: No Hallucination: No Depressed Mood: No Insomnia: No Hypersomnia: No Altered Concentration: No Feels Worthless: No Grandiose Ideas: No Belief In Special Powers: No New/Increased Substance Abuse: No Compulsions: No  Neurologic: Headache: No Seizure: No Paresthesias: No    Outpatient Encounter Prescriptions as of 11/23/2013  Medication Sig  . ARIPiprazole (ABILIFY) 5 MG tablet Take 1 tablet (5 mg total) by mouth daily.  . benazepril (LOTENSIN) 10 MG tablet Take 1 tablet (10 mg total) by mouth daily.  . Canagliflozin 300 MG  TABS Take 1 tablet (300 mg total) by mouth daily.  Marland Kitchen EPINEPHrine (EPIPEN) 0.3 mg/0.3 mL SOAJ injection Inject 0.3 mg into the muscle once as needed. Allergic reaction  . glipiZIDE (GLUCOTROL XL) 10 MG 24 hr tablet Take 1 tablet (10 mg total) by mouth daily with breakfast.  . Iron-FA-B Cmp-C-Biot-Probiotic (FUSION PLUS) CAPS Take 1 capsule by mouth daily before breakfast.  . metFORMIN (GLUCOPHAGE) 1000 MG tablet Take 1,000 mg by mouth 2 (two)  times daily with a meal.  . montelukast (SINGULAIR) 10 MG tablet Take 1 tablet (10 mg total) by mouth at bedtime.  Marland Kitchen omeprazole (PRILOSEC) 20 MG capsule Take 20 mg by mouth daily.  . ondansetron (ZOFRAN) 4 MG tablet Take 1 tablet (4 mg total) by mouth every 6 (six) hours.  . triamterene-hydrochlorothiazide (DYAZIDE) 50-25 MG per capsule Take 1 capsule by mouth every morning.  . [DISCONTINUED] ARIPiprazole (ABILIFY) 5 MG tablet Take 1 tablet (5 mg total) by mouth daily.    Physical Exam: Constitutional:  BP 124/85  Pulse 101  Ht _0  (1.651 m)  Wt 224 lb 12.8 oz (101.969 kg)  BMI 37.41 kg/m2  Recent Results (from the past 2160 hour(s))  HCG, QUANTITATIVE, PREGNANCY     Status: None   Collection Time    08/28/13  1:59 PM      Result Value Ref Range   hCG, Beta Chain, Quant, S <2.0     Comment:        Males and non-pregnant females       < 5   mIU/mL      Gestation Age               Concentration (mIU/mL)        <= 1 Week                       5 - 50           2 Weeks                     50 - 500           3 Weeks                    100 - 10,000           4 Weeks                  1,000 - 30,000           5 Weeks                  3,500 - 115,000         6-8 Weeks                 12,000 - 270,000          12 Weeks                 15,000 - 220,000  CBC WITH DIFFERENTIAL     Status: Abnormal   Collection Time    09/16/13  2:40 PM      Result Value Ref Range   WBC 5.3  4.0 - 10.5 K/uL   RBC 4.72  3.87 - 5.11 MIL/uL   Hemoglobin 13.4  12.0 - 15.0 g/dL   HCT 40.9  36.0 - 46.0 %   MCV 86.7  78.0 - 100.0 fL   MCH 28.4  26.0 - 34.0 pg   MCHC 32.8  30.0 - 36.0 g/dL   RDW 16.0 (*) 11.5 - 15.5 %   Platelets 308  150 - 400 K/uL   Neutrophils Relative % 88 (*) 43 - 77 %   Neutro Abs 4.6  1.7 - 7.7 K/uL   Lymphocytes Relative 7 (*) 12 - 46 %   Lymphs Abs 0.4 (*) 0.7 - 4.0 K/uL   Monocytes Relative 4  3 - 12 %   Monocytes Absolute 0.2  0.1 - 1.0 K/uL   Eosinophils Relative 1  0 - 5 %    Eosinophils Absolute 0.1  0.0 - 0.7 K/uL   Basophils Relative 0  0 - 1 %   Basophils Absolute 0.0  0.0 - 0.1 K/uL  COMPREHENSIVE METABOLIC PANEL     Status: Abnormal   Collection Time    09/16/13  2:40 PM      Result Value Ref Range   Sodium 136 (*) 137 - 147 mEq/L   Potassium 3.8  3.7 - 5.3 mEq/L   Chloride 99  96 - 112 mEq/L   CO2 22  19 - 32 mEq/L   Glucose, Bld 142 (*) 70 - 99 mg/dL   BUN 14  6 - 23 mg/dL   Creatinine, Ser 0.74  0.50 - 1.10 mg/dL   Calcium 9.0  8.4 - 10.5 mg/dL   Total Protein 7.7  6.0 - 8.3 g/dL   Albumin 4.1  3.5 - 5.2 g/dL   AST 23  0 - 37 U/L   ALT 28  0 - 35 U/L   Alkaline Phosphatase 72  39 - 117 U/L   Total Bilirubin 0.4  0.3 - 1.2 mg/dL   GFR calc non Af Amer >90  >90 mL/min   GFR calc Af Amer >90  >90 mL/min   Comment: (NOTE)     The eGFR has been calculated using the CKD EPI equation.     This calculation has not been validated in all clinical situations.     eGFR's persistently <90 mL/min signify possible Chronic Kidney     Disease.  LIPASE, BLOOD     Status: None   Collection Time    09/16/13  2:40 PM      Result Value Ref Range   Lipase 54  11 - 59 U/L  URINALYSIS, ROUTINE W REFLEX MICROSCOPIC     Status: Abnormal   Collection Time    09/16/13  3:05 PM      Result Value Ref Range   Color, Urine YELLOW  YELLOW   APPearance HAZY (*) CLEAR   Specific Gravity, Urine 1.020  1.005 - 1.030   pH 6.0  5.0 - 8.0   Glucose, UA 500 (*) NEGATIVE mg/dL   Hgb urine dipstick NEGATIVE  NEGATIVE   Bilirubin Urine NEGATIVE  NEGATIVE   Ketones, ur 40 (*) NEGATIVE mg/dL   Protein, ur NEGATIVE  NEGATIVE mg/dL   Urobilinogen, UA 0.2  0.0 - 1.0 mg/dL   Nitrite NEGATIVE  NEGATIVE   Leukocytes, UA NEGATIVE  NEGATIVE   Comment: MICROSCOPIC NOT DONE ON URINES WITH NEGATIVE PROTEIN, BLOOD, LEUKOCYTES, NITRITE, OR GLUCOSE <1000 mg/dL.  PREGNANCY, URINE     Status: None   Collection Time    09/16/13  3:05 PM      Result Value Ref Range   Preg Test, Ur  NEGATIVE  NEGATIVE   Comment:            THE SENSITIVITY OF  THIS     METHODOLOGY IS >20 mIU/mL.  HEMOGLOBIN A1C     Status: Abnormal   Collection Time    09/21/13  3:30 PM      Result Value Ref Range   Hemoglobin A1C 8.1 (*) <5.7 %   Comment:                                                                            According to the ADA Clinical Practice Recommendations for 2011, when     HbA1c is used as a screening test:             >=6.5%   Diagnostic of Diabetes Mellitus                (if abnormal result is confirmed)           5.7-6.4%   Increased risk of developing Diabetes Mellitus           References:Diagnosis and Classification of Diabetes Mellitus,Diabetes     XAJO,8786,76(HMCNO 1):S62-S69 and Standards of Medical Care in             Diabetes - 2011,Diabetes Care,2011,34 (Suppl 1):S11-S61.         Mean Plasma Glucose 186 (*) <117 mg/dL  COMPLETE METABOLIC PANEL WITH GFR     Status: Abnormal   Collection Time    09/21/13  3:30 PM      Result Value Ref Range   Sodium 135  135 - 145 mEq/L   Potassium 4.1  3.5 - 5.3 mEq/L   Chloride 99  96 - 112 mEq/L   CO2 27  19 - 32 mEq/L   Glucose, Bld 154 (*) 70 - 99 mg/dL   BUN 9  6 - 23 mg/dL   Creat 0.81  0.50 - 1.10 mg/dL   Total Bilirubin 0.2  0.2 - 1.2 mg/dL   Comment: ** Please note change in reference range(s). **   Alkaline Phosphatase 51  39 - 117 U/L   AST 20  0 - 37 U/L   ALT 22  0 - 35 U/L   Total Protein 6.6  6.0 - 8.3 g/dL   Albumin 4.3  3.5 - 5.2 g/dL   Calcium 9.7  8.4 - 10.5 mg/dL   GFR, Est African American >89     GFR, Est Non African American >89     Comment:       The estimated GFR is a calculation valid for adults (>=74 years old)     that uses the CKD-EPI algorithm to adjust for age and sex. It is       not to be used for children, pregnant women, hospitalized patients,        patients on dialysis, or with rapidly changing kidney function.     According to the NKDEP, eGFR >89 is normal, 60-89 shows  mild     impairment, 30-59 shows moderate impairment, 15-29 shows severe     impairment and <15 is ESRD.          Musculoskeletal: Strength & Muscle Tone: within normal limits Gait & Station: normal Patient leans: Front  Mental Status Examination;  Patient is a middle-aged female who is casually  dressed and fairly groomed.  Her speech is clear and coherent.  Her thought processes is logical and goal directed.  She described her mood anxious  and her affect is mood appropriate.  She denies any active or passive suicidal thoughts or homicidal thoughts.  There were no paranoia or delusions present at this time.  Her fund of knowledge is adequate.  She is alert and oriented x3.  Her attention and concentration is fair.  Her insight judgment and impulse control is okay.   Established Problem, Stable/Improving (1), Review of Psycho-Social Stressors (1), Review or order clinical lab tests (1), Review of Last Therapy Session (1) and Review of Medication Regimen & Side Effects (2)  Assessment: Axis I: Bipolar disorder NOS  Axis II: Deferred  Axis III:  Past Medical History  Diagnosis Date  . GERD (gastroesophageal reflux disease)   . Bipolar disorder   . Gastroparesis   . 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 Hypertension     chronic on meds  . Diabetes mellitus     type 2 DM, insulin only needed during pregnancy  . Chronic kidney disease     proteinuria    Axis IV: Mild   Plan:  Reassurance given to make a decision .  Patient is reluctant to take full-time job because she is taking care of HER-2 children to and she has done very well with the part-time.  In the past she has taken full-time job at [employer] which causes significant paranoia and distrust.  Recommended to discuss with her father and other family members about her personal feelings .  I review her blood work results, her hemoglobin A1c is high.  She started exercise, walking and taking oral hypoglycemic  medication.  She is doing better on low-dose Abilify.  She does not report any side effects including any tremors or shakes.  I will continue Abilify 5 mg daily.  Recommended to keep appointment with her primary care physician this afternoon.  Recommend to call us back if she is any question about any concern.  I will see her again in 3 months.  Jaquana Geiger T., MD 11/23/2013

## 2013-11-23 NOTE — Patient Instructions (Signed)
F/u as before  Labs before next visit as before  No sign of infection in areas where you had foot trauma  You are referred to orthopedics re trigger finger  Stop caffeine, this makes your heart race, call back if you feel the heart racing is significant for referral to cardiology  It is important that you exercise regularly at least 30 minutes 5 times a week. If you develop chest pain, have severe difficulty breathing, or feel very tired, stop exercising immediately and seek medical attention   A healthy diet is rich in fruit, vegetables and whole grains. Poultry fish, nuts and beans are a healthy choice for protein rather then red meat. A low sodium diet and drinking 64 ounces of water daily is generally recommended. Oils and sweet should be limited. Carbohydrates especially for those who are diabetic or overweight, should be limited to 60-45 gram per meal. It is important to eat on a regular schedule, at least 3 times daily. Snacks should be primarily fruits, vegetables or nuts.

## 2013-11-23 NOTE — Progress Notes (Signed)
   Subjective:    Patient ID: Christina [name], female    DOB: Oct 13, 1975, 38 y.o.   MRN: [id number]  HPI  The PT denies any adverse reactions to current medications since the last visit.  Trauma to right heel last Saturday has used neosporin with success, and had a blister, on her foot, which had resolved but wants areas looked at  C/o worsening trigger finger, wants help Recently started a part time job which she is happy about Has seen nephrologist since last vist, no new meds, saw psych earlier tofday and reports stability     Review of Systems See HPI Denies recent fever or chills. Denies sinus pressure, nasal congestion, ear pain or sore throat. Denies chest congestion, productive cough or wheezing. Denies chest pains, palpitations and leg swelling Denies abdominal pain, nausea, vomiting,diarrhea or constipation.   Denies dysuria, frequency, hesitancy or incontinence. . Denies headaches, seizures, numbness, or tingling. Denies uncontrolled  depression, anxiety or insomnia. .        Objective:   Physical Exam  BP 118/82  Pulse 97  Resp 16  Ht 5' 5.5" (1.664 m)  Wt 224 lb 12.8 oz (101.969 kg)  BMI 36.83 kg/m2  SpO2 97% Patient alert and oriented and in no cardiopulmonary distress.  HEENT: No facial asymmetry, EOMI, no sinus tenderness,  oropharynx pink and moist.  Neck supple no adenopathy.  Chest: Clear to auscultation bilaterally.  CVS: S1, S2 no murmurs, no S3.  ABD: Soft non tender. Bowel sounds normal.  Ext: No edema  MS: Adequate ROM spine, shoulders, hips and knees.right ring trigger finger  Skin: Intact, no ulcerations or rash noted.  Psych: Good eye contact, normal affect. Memory intact not anxious or depressed appearing.  CNS: CN 2-12 intact, power, tone and sensation normal throughout.       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 Updated lab needed at/ before next visit.   OBESITY Unchanged Patient re-educated about  the importance of commitment to a  minimum of 150 minutes of exercise per week. The importance of healthy food choices with portion control discussed. Encouraged to start a food diary, count calories and to consider  joining a support group. Sample diet sheets offered. Goals set by the patient for the next several months.     Essential hypertension, benign Controlled, no change in medication DASH diet and commitment to daily physical activity for a minimum of 30 minutes discussed and encouraged, as a part of hypertension management. The importance of attaining a healthy weight is also discussed.

## 2013-11-26 NOTE — Assessment & Plan Note (Signed)
States worsened symptoms , "wants relief" referred to ortho for eval and management

## 2013-11-26 NOTE — Assessment & Plan Note (Signed)
Controlled, no change in medication DASH diet and commitment to daily physical activity for a minimum of 30 minutes discussed and encouraged, as a part of hypertension management. The importance of attaining a healthy weight is also discussed.  

## 2013-11-26 NOTE — Assessment & Plan Note (Signed)
Unchanged. Patient re-educated about  the importance of commitment to a  minimum of 150 minutes of exercise per week. The importance of healthy food choices with portion control discussed. Encouraged to start a food diary, count calories and to consider  joining a support group. Sample diet sheets offered. Goals set by the patient for the next several months.    

## 2013-11-26 NOTE — Assessment & Plan Note (Signed)
Patient advised to reduce carb and sweets, commit to regular physical activity, take meds as prescribed, test blood as directed, and attempt to lose weight, to improve blood sugar control. Updated lab needed at/ before next visit.  

## 2013-12-14 ENCOUNTER — Ambulatory Visit: Payer: Self-pay | Admitting: Family Medicine

## 2013-12-22 LAB — COMPLETE METABOLIC PANEL WITH GFR
ALT: 14 U/L (ref 0–35)
AST: 17 U/L (ref 0–37)
Albumin: 4.5 g/dL (ref 3.5–5.2)
Alkaline Phosphatase: 62 U/L (ref 39–117)
BUN: 12 mg/dL (ref 6–23)
CO2: 24 mEq/L (ref 19–32)
Calcium: 9.8 mg/dL (ref 8.4–10.5)
Chloride: 99 mEq/L (ref 96–112)
Creat: 0.86 mg/dL (ref 0.50–1.10)
GFR, Est African American: >89 mL/min
GFR, Est Non African American: 86 mL/min
Glucose, Bld: 86 mg/dL (ref 70–99)
Potassium: 3.8 mEq/L (ref 3.5–5.3)
Sodium: 137 mEq/L (ref 135–145)
Total Bilirubin: 0.3 mg/dL (ref 0.2–1.2)
Total Protein: 7.7 g/dL (ref 6.0–8.3)

## 2013-12-22 LAB — HEMOGLOBIN A1C
Hgb A1c MFr Bld: 6.7 % — ABNORMAL HIGH (ref <5.7)
Mean Plasma Glucose: 146 mg/dL — ABNORMAL HIGH (ref <117)

## 2013-12-26 ENCOUNTER — Ambulatory Visit (INDEPENDENT_AMBULATORY_CARE_PROVIDER_SITE_OTHER): Payer: Medicaid Other | Admitting: Family Medicine

## 2013-12-26 ENCOUNTER — Encounter: Payer: Self-pay | Admitting: Family Medicine

## 2013-12-26 VITALS — BP 126/82 | HR 100 | Resp 18 | Ht 65.5 in | Wt 226.0 lb

## 2013-12-26 DIAGNOSIS — M542 Cervicalgia: Secondary | ICD-10-CM | POA: Insufficient documentation

## 2013-12-26 DIAGNOSIS — M65341 Trigger finger, right ring finger: Secondary | ICD-10-CM

## 2013-12-26 DIAGNOSIS — R809 Proteinuria, unspecified: Secondary | ICD-10-CM

## 2013-12-26 DIAGNOSIS — I10 Essential (primary) hypertension: Secondary | ICD-10-CM

## 2013-12-26 DIAGNOSIS — J309 Allergic rhinitis, unspecified: Secondary | ICD-10-CM

## 2013-12-26 DIAGNOSIS — E669 Obesity, unspecified: Secondary | ICD-10-CM

## 2013-12-26 DIAGNOSIS — IMO0001 Reserved for inherently not codable concepts without codable children: Secondary | ICD-10-CM

## 2013-12-26 DIAGNOSIS — K219 Gastro-esophageal reflux disease without esophagitis: Secondary | ICD-10-CM

## 2013-12-26 DIAGNOSIS — E1165 Type 2 diabetes mellitus with hyperglycemia: Secondary | ICD-10-CM

## 2013-12-26 DIAGNOSIS — M6530 Trigger finger, unspecified finger: Secondary | ICD-10-CM

## 2013-12-26 DIAGNOSIS — F319 Bipolar disorder, unspecified: Secondary | ICD-10-CM

## 2013-12-26 DIAGNOSIS — J302 Other seasonal allergic rhinitis: Secondary | ICD-10-CM

## 2013-12-26 MED ORDER — METHYLPREDNISOLONE ACETATE 80 MG/ML IJ SUSP
80.0000 mg | Freq: Once | INTRAMUSCULAR | Status: AC
  Administered 2013-12-26: 80 mg via INTRAMUSCULAR

## 2013-12-26 MED ORDER — KETOROLAC TROMETHAMINE 60 MG/2ML IM SOLN
60.0000 mg | Freq: Once | INTRAMUSCULAR | Status: AC
  Administered 2013-12-26: 60 mg via INTRAMUSCULAR

## 2013-12-26 MED ORDER — IBUPROFEN 800 MG PO TABS: 800.0000 mg | ORAL_TABLET | Freq: Three times a day (TID) | ORAL | Status: DC

## 2013-12-26 MED ORDER — PREDNISONE 5 MG PO TABS: 5.0000 mg | ORAL_TABLET | Freq: Two times a day (BID) | ORAL | Status: DC
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
## 2013-12-26 NOTE — Patient Instructions (Addendum)
F/u in 3.5 month, call if you need me before  Your  Blood sugar is excellent and at goal, congrats, no changes in medication   You are referred for an mRI of your neck due to symptoms  You will receive toradol 47m and depo medrol 80 mg iM in the office followed by a 5 day course of ibuprofen and prednisone.  HBA1C , chem 7 and EGFR in 3.5 month, non fasting

## 2013-12-30 ENCOUNTER — Other Ambulatory Visit: Payer: Self-pay | Admitting: Family Medicine

## 2013-12-31 NOTE — Assessment & Plan Note (Signed)
Controlled, no change in medication  

## 2013-12-31 NOTE — Assessment & Plan Note (Signed)
Improved. Pt applauded on succesful weight loss through lifestyle change, and encouraged to continue same. Weight loss goal set for the next several months.  

## 2013-12-31 NOTE — Assessment & Plan Note (Addendum)
Controlled and stable treated by psych

## 2013-12-31 NOTE — Assessment & Plan Note (Signed)
Improved Patient educated about the importance of limiting  Carbohydrate intake , the need to commit to daily physical activity for a minimum of 30 minutes , and to commit weight loss. The fact that changes in all these areas will reduce or eliminate all together the development of diabetes is stressed.

## 2013-12-31 NOTE — Assessment & Plan Note (Addendum)
Uncontrolled pain radiating to left upper arm, anti inflammatories in the office and also oral anti inflammatories also needs MRI of c spine to further evaluate

## 2013-12-31 NOTE — Assessment & Plan Note (Signed)
Needs to reschedule her appt with ortho

## 2013-12-31 NOTE — Assessment & Plan Note (Signed)
Controlled, no change in medication DASH diet and commitment to daily physical activity for a minimum of 30 minutes discussed and encouraged, as a part of hypertension management. The importance of attaining a healthy weight is also discussed.  

## 2014-01-02 ENCOUNTER — Ambulatory Visit (HOSPITAL_COMMUNITY): Payer: Medicaid Other

## 2014-01-05 ENCOUNTER — Ambulatory Visit (HOSPITAL_COMMUNITY)
Admission: RE | Admit: 2014-01-05 | Discharge: 2014-01-05 | Disposition: A | Discharge status: Home / Self Care | Payer: Medicaid Other | Source: Ambulatory Visit | Attending: Family Medicine | Admitting: Family Medicine

## 2014-01-05 ENCOUNTER — Other Ambulatory Visit: Payer: Self-pay

## 2014-01-05 ENCOUNTER — Encounter: Payer: Self-pay | Admitting: Family Medicine

## 2014-01-05 DIAGNOSIS — R29898 Other symptoms and signs involving the musculoskeletal system: Secondary | ICD-10-CM

## 2014-01-05 DIAGNOSIS — M5030 Other cervical disc degeneration, unspecified cervical region: Secondary | ICD-10-CM | POA: Insufficient documentation

## 2014-01-05 DIAGNOSIS — M4802 Spinal stenosis, cervical region: Secondary | ICD-10-CM | POA: Insufficient documentation

## 2014-01-05 DIAGNOSIS — IMO0002 Reserved for concepts with insufficient information to code with codable children: Secondary | ICD-10-CM

## 2014-01-05 DIAGNOSIS — R209 Unspecified disturbances of skin sensation: Secondary | ICD-10-CM | POA: Insufficient documentation

## 2014-01-05 DIAGNOSIS — M5020 Other cervical disc displacement, unspecified cervical region: Secondary | ICD-10-CM | POA: Insufficient documentation

## 2014-01-05 DIAGNOSIS — M47812 Spondylosis without myelopathy or radiculopathy, cervical region: Secondary | ICD-10-CM | POA: Insufficient documentation

## 2014-01-05 DIAGNOSIS — M542 Cervicalgia: Secondary | ICD-10-CM | POA: Insufficient documentation

## 2014-01-16 ENCOUNTER — Other Ambulatory Visit: Payer: Self-pay | Admitting: Family Medicine

## 2014-02-06 ENCOUNTER — Emergency Department (HOSPITAL_COMMUNITY)
Admission: EM | Admit: 2014-02-06 | Discharge: 2014-02-07 | Disposition: A | Discharge status: Home / Self Care | Payer: Medicaid Other | Attending: Emergency Medicine | Admitting: Emergency Medicine

## 2014-02-06 ENCOUNTER — Encounter (HOSPITAL_COMMUNITY): Payer: Self-pay | Admitting: Emergency Medicine

## 2014-02-06 DIAGNOSIS — E119 Type 2 diabetes mellitus without complications: Secondary | ICD-10-CM | POA: Diagnosis not present

## 2014-02-06 DIAGNOSIS — Z791 Long term (current) use of non-steroidal anti-inflammatories (NSAID): Secondary | ICD-10-CM | POA: Diagnosis not present

## 2014-02-06 DIAGNOSIS — I129 Hypertensive chronic kidney disease with stage 1 through stage 4 chronic kidney disease, or unspecified chronic kidney disease: Secondary | ICD-10-CM | POA: Diagnosis not present

## 2014-02-06 DIAGNOSIS — R221 Localized swelling, mass and lump, neck: Secondary | ICD-10-CM | POA: Diagnosis not present

## 2014-02-06 DIAGNOSIS — Z79899 Other long term (current) drug therapy: Secondary | ICD-10-CM | POA: Diagnosis not present

## 2014-02-06 DIAGNOSIS — N189 Chronic kidney disease, unspecified: Secondary | ICD-10-CM | POA: Insufficient documentation

## 2014-02-06 DIAGNOSIS — F319 Bipolar disorder, unspecified: Secondary | ICD-10-CM | POA: Insufficient documentation

## 2014-02-06 DIAGNOSIS — IMO0002 Reserved for concepts with insufficient information to code with codable children: Secondary | ICD-10-CM | POA: Diagnosis not present

## 2014-02-06 DIAGNOSIS — Z8619 Personal history of other infectious and parasitic diseases: Secondary | ICD-10-CM | POA: Diagnosis not present

## 2014-02-06 DIAGNOSIS — K3184 Gastroparesis: Secondary | ICD-10-CM | POA: Insufficient documentation

## 2014-02-06 DIAGNOSIS — K219 Gastro-esophageal reflux disease without esophagitis: Secondary | ICD-10-CM | POA: Diagnosis not present

## 2014-02-06 DIAGNOSIS — L259 Unspecified contact dermatitis, unspecified cause: Secondary | ICD-10-CM | POA: Insufficient documentation

## 2014-02-06 DIAGNOSIS — R21 Rash and other nonspecific skin eruption: Secondary | ICD-10-CM | POA: Diagnosis present

## 2014-02-06 DIAGNOSIS — R220 Localized swelling, mass and lump, head: Secondary | ICD-10-CM | POA: Diagnosis not present

## 2014-02-06 DIAGNOSIS — Z880 Allergy status to penicillin: Secondary | ICD-10-CM | POA: Insufficient documentation

## 2014-02-06 NOTE — ED Notes (Signed)
Pt states she is allergic to peanuts, and has not ingested any, but she handled a bag a granola that she thinks had peanuts in it.  Pt reports swelling of eyelids and lips, denies resp diff, also has rash on arms

## 2014-02-07 MED ORDER — PREDNISONE 50 MG PO TABS: 50.0000 mg | ORAL_TABLET | Freq: Every day | ORAL | Status: DC

## 2014-02-07 MED ORDER — PREDNISONE 50 MG PO TABS
60.0000 mg | ORAL_TABLET | Freq: Once | ORAL | Status: AC
  Administered 2014-02-07: 60 mg via ORAL
  Filled 2014-02-07 (×2): qty 1

## 2014-02-07 NOTE — Discharge Instructions (Signed)

## 2014-02-07 NOTE — ED Provider Notes (Signed)
CSN: [id number]     Arrival date & time 02/06/14  2310 History   This chart was scribed for Christina Rice, MD by Lowella Petties, ED Scribe. The patient was seen in room APA10/APA10. Patient's care was started at 12:20 AM.   No chief complaint on file.   HPI HPI Comments: Christina [name] is a 38 y.o. female who presents to the Emergency Department complaining of a itching rash onset earlier tonight. She reports that she has a skin rash consisting of raised red bumps running up her BUE, BLE, and on her back. She reports that her eyes and mouth were swollen, but that she took Benadryl with relief. She reports that she was vacuuming earlier, and states that she thought this might be connected with allergies. She states that this may be poison ivy, as she was touching a box that had been in a yard with poison ivy. She reports a history of food allergies. She denies any intraoral swelling or any difficulty breathing.  Past Medical History  Diagnosis Date  . GERD (gastroesophageal reflux disease)   . Bipolar disorder   . Gastroparesis   . 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 Hypertension     chronic on meds  . Diabetes mellitus     type 2 DM, insulin only needed during pregnancy  . Chronic kidney disease     proteinuria   Past Surgical History  Procedure Laterality Date  . Breast reduction surgery  1994  . Dermoid tumor  2000  . Esophagogastroduodenoscopy  07/01/2009    KA[facility]:GOTLXB esphagus without barrett's/dilation with 16 mm/mild erthyema in the antrum. mild chronic gastritis on path.    Family History  Problem Relation Age of Onset  . Diabetes Mother   . Hypertension Mother   . Fibromyalgia Mother   . Diabetes Father   . Hypertension Father   . Asthma Father   . Colon cancer Neg Hx    History  Substance Use Topics  . Smoking status: Never Smoker   . Smokeless tobacco: Never Used  . Alcohol Use: No   OB History   Grav Para Term Preterm Abortions TAB SAB Ect Mult Living    2 2 1 1      2      Review of Systems  Constitutional: Negative for fever and chills.  HENT: Positive for facial swelling. Negative for trouble swallowing and voice change.   Respiratory: Negative for chest tightness, shortness of breath, wheezing and stridor.   Cardiovascular: Negative for chest pain.  Gastrointestinal: Negative for nausea, vomiting and abdominal pain.  Genitourinary: Negative for dysuria.  Musculoskeletal: Negative for back pain, neck pain and neck stiffness.  Skin: Positive for rash. Negative for wound.  Neurological: Negative for dizziness, tremors, light-headedness, numbness and headaches.  All other systems reviewed and are negative.     Allergies  Peanut oil; Peanut-containing drug products; Amoxicillin-pot clavulanate; and Other  Home Medications   Prior to Admission medications   Medication Sig Start Date End Date Taking? Authorizing Provider  ARIPiprazole (ABILIFY) 5 MG tablet Take 1 tablet (5 mg total) by mouth daily. 11/23/13 11/23/14 Yes Kathlee Nations, MD  Canagliflozin 300 MG TABS Take 1 tablet (300 mg total) by mouth daily. 09/06/13  Yes [name], MD  EPINEPHrine (EPIPEN) 0.3 mg/0.3 mL SOAJ injection Inject 0.3 mg into the muscle once as needed. Allergic reaction   Yes Historical Provider, MD  glipiZIDE (GLUCOTROL XL) 10 MG 24 hr  tablet TAKE ONE TABLET BY MOUTH ONCE DAILY WITH BREAKFAST   Yes [name], MD  ibuprofen (ADVIL,MOTRIN) 800 MG tablet Take 1 tablet (800 mg total) by mouth 3 (three) times daily. 12/26/13  Yes [name], MD  Iron-FA-B Cmp-C-Biot-Probiotic (FUSION PLUS) CAPS Take 1 capsule by mouth daily before breakfast. 08/09/13  Yes Shelly Bombard, MD  LORATADINE PO Take by mouth. otc   Yes Historical Provider, MD  metFORMIN (GLUCOPHAGE) 1000 MG tablet Take 1,000 mg by mouth 2 (two) times daily with a meal.   Yes Historical Provider, MD  montelukast (SINGULAIR) 10 MG tablet Take 1 tablet (10 mg total) by mouth at  bedtime. 09/26/13  Yes [name], MD  omeprazole (PRILOSEC) 20 MG capsule Take 20 mg by mouth daily.   Yes Historical Provider, MD  Triamcinolone Acetonide (NASACORT ALLERGY 24HR NA) Place into the nose.   Yes Historical Provider, MD  triamterene-hydrochlorothiazide (DYAZIDE) 50-25 MG per capsule Take 1 capsule by mouth every morning. 08/09/13  Yes Shelly Bombard, MD  metFORMIN (GLUCOPHAGE-XR) 500 MG 24 hr tablet TAKE FOUR TABLETS BY MOUTH ONCE DAILY WITH  BREAKFAST    [name], MD  predniSONE (DELTASONE) 5 MG tablet Take 1 tablet (5 mg total) by mouth 2 (two) times daily with a meal. 12/26/13   [name], MD   Triage Vitals: BP 159/107  Pulse 79  Temp(Src) 98.1 F (36.7 C) (Oral)  Resp 16  Ht 5\' 5"  (1.651 m)  Wt 222 lb (100.699 kg)  BMI 36.94 kg/m2  SpO2 100%  LMP 01/22/2014 Physical Exam  Nursing note and vitals reviewed. Constitutional: She is oriented to person, place, and time. She appears well-developed and well-nourished. No distress.  HENT:  Head: Normocephalic and atraumatic.  Mouth/Throat: Oropharynx is clear and moist.  No intraoral swelling  Eyes: EOM are normal. Pupils are equal, round, and reactive to light.  Neck: Normal range of motion. Neck supple.  Cardiovascular: Normal rate and regular rhythm.   Pulmonary/Chest: Effort normal and breath sounds normal. No stridor. No respiratory distress. She has no wheezes. She has no rales. She exhibits no tenderness.  Abdominal: Soft. Bowel sounds are normal.  Musculoskeletal: Normal range of motion. She exhibits no edema and no tenderness.  Neurological: She is alert and oriented to person, place, and time.  Skin: Skin is warm and dry. Rash (Papulovesicular rash to upper extremities and upper back. Mildly erythematous. No evidence of infection) noted. No erythema.  Psychiatric: She has a normal mood and affect. Her behavior is normal.    ED Course  Procedures (including critical care  time) DIAGNOSTIC STUDIES: Oxygen Saturation is 100% on room air, normal by my interpretation.    COORDINATION OF CARE: 12:24 AM-Discussed treatment plan with pt at bedside and pt agreed to plan.   Labs Review Labs Reviewed - No data to display  Imaging Review No results found.   EKG Interpretation None      MDM   Final diagnoses:  None    I personally performed the services described in this documentation, which was scribed in my presence. The recorded information has been reviewed and is accurate.  Exam consistent with dermatitis likely contact in origin. Given the diffuse nature we'll start a short course of steroids. Return precautions have been given and the patient's voice understanding.   Christina Rice, MD 02/07/14 [phone]

## 2014-02-22 ENCOUNTER — Telehealth (HOSPITAL_COMMUNITY): Payer: Self-pay

## 2014-02-22 ENCOUNTER — Ambulatory Visit (HOSPITAL_COMMUNITY): Payer: Self-pay | Admitting: Psychiatry

## 2014-02-26 ENCOUNTER — Telehealth: Payer: Self-pay

## 2014-02-26 MED ORDER — ONDANSETRON HCL 4 MG PO TABS: 4.0000 mg | ORAL_TABLET | Freq: Every day | ORAL | Status: DC | PRN

## 2014-02-26 MED ORDER — DIPHENOXYLATE-ATROPINE 2.5-0.025 MG PO TABS: 1.0000 | ORAL_TABLET | Freq: Four times a day (QID) | ORAL | Status: DC | PRN

## 2014-02-26 NOTE — Telephone Encounter (Signed)
Vomiting No.    Recommended treatment Hydration is important Fluids small frequent amounts as tolerated Good hygiene reduces transmission among family members Review Brat diet  Zofran 4 mg 1 tablet daily as needed for nausea and vomiting no more than 6 tablets   DiarrheaYes.    Recommended treatment  Imodium OTC  Can also offer Lomotil 1 tablet 4 times daily as needed no more than 10 tablets Good hygiene reduces transmission among family members  Patient aware of advice.  States that she has had loose stools up to 5x daily x 4 days.  She also has nausea without vomiting.  Is aware of precautions.  Will call back to the office if she has any further problems.  Review Brat Diet  If patient starts to feel light headed or not passing much urine or becoming dehydrated will need to go to emergency room for IV hydration  Please call office if symptoms worsen or do not improve after 2-3 days

## 2014-03-02 ENCOUNTER — Encounter (HOSPITAL_COMMUNITY): Payer: Self-pay | Admitting: Psychiatry

## 2014-03-02 ENCOUNTER — Ambulatory Visit (INDEPENDENT_AMBULATORY_CARE_PROVIDER_SITE_OTHER): Payer: 59 | Admitting: Psychiatry

## 2014-03-02 ENCOUNTER — Ambulatory Visit (HOSPITAL_COMMUNITY): Payer: Self-pay | Admitting: Psychiatry

## 2014-03-02 VITALS — BP 145/90 | HR 90 | Wt 227.0 lb

## 2014-03-02 DIAGNOSIS — F319 Bipolar disorder, unspecified: Secondary | ICD-10-CM

## 2014-03-02 MED ORDER — ARIPIPRAZOLE 5 MG PO TABS: 5.0000 mg | ORAL_TABLET | Freq: Every day | ORAL | Status: DC
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
## 2014-03-05 ENCOUNTER — Encounter: Payer: Self-pay | Admitting: Family Medicine

## 2014-03-05 ENCOUNTER — Ambulatory Visit (INDEPENDENT_AMBULATORY_CARE_PROVIDER_SITE_OTHER): Payer: Medicaid Other | Admitting: Family Medicine

## 2014-03-05 VITALS — BP 114/82 | HR 90 | Resp 16 | Ht 65.0 in | Wt 231.8 lb

## 2014-03-05 DIAGNOSIS — R197 Diarrhea, unspecified: Secondary | ICD-10-CM

## 2014-03-05 DIAGNOSIS — I10 Essential (primary) hypertension: Secondary | ICD-10-CM

## 2014-03-05 LAB — COMPREHENSIVE METABOLIC PANEL
ALT: 16 U/L (ref 0–35)
AST: 14 U/L (ref 0–37)
Albumin: 4 g/dL (ref 3.5–5.2)
Alkaline Phosphatase: 54 U/L (ref 39–117)
BUN: 4 mg/dL — ABNORMAL LOW (ref 6–23)
CO2: 27 mEq/L (ref 19–32)
Calcium: 8.8 mg/dL (ref 8.4–10.5)
Chloride: 104 mEq/L (ref 96–112)
Creat: 0.75 mg/dL (ref 0.50–1.10)
Glucose, Bld: 119 mg/dL — ABNORMAL HIGH (ref 70–99)
Potassium: 3.9 mEq/L (ref 3.5–5.3)
Sodium: 139 mEq/L (ref 135–145)
Total Bilirubin: 0.3 mg/dL (ref 0.2–1.2)
Total Protein: 6.4 g/dL (ref 6.0–8.3)

## 2014-03-05 LAB — CBC WITH DIFFERENTIAL/PLATELET
Basophils Absolute: 0 10*3/uL (ref 0.0–0.1)
Basophils Relative: 0 % (ref 0–1)
Eosinophils Absolute: 0.2 10*3/uL (ref 0.0–0.7)
Eosinophils Relative: 4 % (ref 0–5)
HCT: 35.1 % — ABNORMAL LOW (ref 36.0–46.0)
Hemoglobin: 11.9 g/dL — ABNORMAL LOW (ref 12.0–15.0)
Lymphocytes Relative: 50 % — ABNORMAL HIGH (ref 12–46)
Lymphs Abs: 1.9 10*3/uL (ref 0.7–4.0)
MCH: 29 pg (ref 26.0–34.0)
MCHC: 33.9 g/dL (ref 30.0–36.0)
MCV: 85.4 fL (ref 78.0–100.0)
Monocytes Absolute: 0.3 10*3/uL (ref 0.1–1.0)
Monocytes Relative: 7 % (ref 3–12)
Neutro Abs: 1.5 10*3/uL — ABNORMAL LOW (ref 1.7–7.7)
Neutrophils Relative %: 39 % — ABNORMAL LOW (ref 43–77)
Platelets: 306 10*3/uL (ref 150–400)
RBC: 4.11 MIL/uL (ref 3.87–5.11)
RDW: 15 % (ref 11.5–15.5)
WBC: 3.8 10*3/uL — ABNORMAL LOW (ref 4.0–10.5)

## 2014-03-05 MED ORDER — DIPHENOXYLATE-ATROPINE 2.5-0.025 MG PO TABS: 1.0000 | ORAL_TABLET | Freq: Four times a day (QID) | ORAL | Status: DC | PRN

## 2014-03-05 NOTE — Progress Notes (Signed)
   Subjective:    Patient ID: Christina [name], female    DOB: [birth year], 38 y.o.   MRN: [id number]  HPI 10 day h/o loose watery stool, as much as 10 per day, no nausea, stomach is cramping, no fever, chills, body aches, no other family member affected. Mom has IBS Mucus in stool , no blood, no known exposure to contaminant, no changes in meds C/o numbness in toes over the last several days, no other new neurologic complaints    Review of Systems See HPI Denies recent fever or chills. Denies sinus pressure, nasal congestion, ear pain or sore throat. Denies chest congestion, productive cough or wheezing. Denies chest pains, palpitations and leg swelling  Denies dysuria, frequency, hesitancy or incontinence.  Denies uncontrolled  depression, anxiety or insomnia. Denies skin break down or rash.        Objective:   Physical Exam BP 114/82  Pulse 90  Resp 16  Ht 5\' 5"  (1.651 m)  Wt 231 lb 12.8 oz (105.144 kg)  BMI 38.57 kg/m2  SpO2 98%  LMP 01/22/2014 Patient alert and oriented and in no cardiopulmonary distress.Adequatelyu hydrated  HEENT: No facial asymmetry, EOMI,   oropharynx pink and moist.  Neck supple no JVD, no mass.  Chest: Clear to auscultation bilaterally.  CVS: S1, S2 no murmurs, no S3.Regular rate.  ABD: Soft diffuse superficial tenderness with hyperactive BS, no guarding or rebound.   Ext: No edema  MS: Adequate ROM spine, shoulders, hips and knees.  Skin: Intact, no ulcerations or rash noted.  Psych: Good eye contact, normal affect. Memory intact not anxious or depressed appearing.  CNS: CN 2-12 intact, power,  normal throughout.no focal deficits noted.        Assessment & Plan:  Diarrhea 10 day history with no relief, does not sound infectious, may be IBS Labs and refill lomotil. Work excuse to return in am Hand hygiene discussed and encouraged  HYPERTENSION Controlled, no change in medication DASH diet and commitment to daily physical  activity for a minimum of 30 minutes discussed and encouraged, as a part of hypertension management. The importance of attaining a healthy weight is also discussed.

## 2014-03-05 NOTE — Assessment & Plan Note (Signed)
Controlled, no change in medication DASH diet and commitment to daily physical activity for a minimum of 30 minutes discussed and encouraged, as a part of hypertension management. The importance of attaining a healthy weight is also discussed.  

## 2014-03-05 NOTE — Assessment & Plan Note (Addendum)
10 day history with no relief, does not sound infectious, may be IBS Labs and refill lomotil. Work excuse to return in am Hand hygiene discussed and encouraged

## 2014-03-05 NOTE — Patient Instructions (Addendum)
F/u as before  You will have blood work and stool tests re your loose stool Work excuse today to return in am   Refill on lomotil  Keep GI appt  i think this MAY be IBS, the GI Doc will sort this out  If numb toeas continue to be a concern, nerve conductiontests may be ordered to establish nerve damage, call back if you want that, and ensure your footwear is good, and not pressing on your toes

## 2014-03-06 ENCOUNTER — Encounter: Payer: Self-pay | Admitting: Family Medicine

## 2014-03-06 ENCOUNTER — Other Ambulatory Visit: Payer: Self-pay | Admitting: Family Medicine

## 2014-03-07 LAB — OVA AND PARASITE EXAMINATION: OP: NONE SEEN

## 2014-03-07 LAB — C. DIFFICILE GDH AND TOXIN A/B
C. difficile GDH: NOT DETECTED
C. difficile Toxin A/B: NOT DETECTED

## 2014-03-10 ENCOUNTER — Encounter: Payer: Self-pay | Admitting: Family Medicine

## 2014-03-10 LAB — STOOL CULTURE

## 2014-03-30 ENCOUNTER — Encounter: Payer: Self-pay | Admitting: Gastroenterology

## 2014-03-30 ENCOUNTER — Ambulatory Visit (INDEPENDENT_AMBULATORY_CARE_PROVIDER_SITE_OTHER): Payer: Medicaid Other | Admitting: Gastroenterology

## 2014-03-30 VITALS — BP 129/86 | HR 84 | Temp 98.4°F | Ht 65.0 in | Wt 229.0 lb

## 2014-03-30 DIAGNOSIS — R110 Nausea: Secondary | ICD-10-CM

## 2014-03-30 DIAGNOSIS — R112 Nausea with vomiting, unspecified: Secondary | ICD-10-CM | POA: Insufficient documentation

## 2014-03-30 DIAGNOSIS — K625 Hemorrhage of anus and rectum: Secondary | ICD-10-CM | POA: Insufficient documentation

## 2014-03-30 MED ORDER — PEG-KCL-NACL-NASULF-NA ASC-C 100 G PO SOLR: 1.0000 | ORAL | Status: DC

## 2014-03-30 MED ORDER — ONDANSETRON HCL 4 MG PO TABS: 4.0000 mg | ORAL_TABLET | Freq: Three times a day (TID) | ORAL | Status: DC

## 2014-03-30 NOTE — Progress Notes (Signed)
Referring Provider: [name], MD Primary Care Physician:  Tula Nakayama, MD Primary GI: Dr. Oneida Alar  Chief Complaint  Patient presents with  . Nausea  . Diarrhea    HPI:   Christina [name] presents today at the request of Dr. Moshe Cipro secondary to recent episode of loose stools. Loose stools starting July 23 through Aug 11 Was having up to 8 loose stools per day. Stool studies negative. Believe it was well water issues. [place] and had water tested. Switched to bottled water. No issues now since changing drinking water. Back to baseline of 1 BM per day. Sometimes every other day. Noted moderate hematochezia. Had severe abdominal cramping periumbilically during diarrhea. Sometimes like a band around abdomen.   Underlying nausea, usually after eating. Had Zofran prescribed during diarrhea illness. Has 10 tablets left. History of gastroparesis. No vomiting.    Past Medical History  Diagnosis Date  . GERD (gastroesophageal reflux disease)   . Bipolar disorder   . Gastroparesis   . 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 Hypertension     chronic on meds  . Diabetes mellitus     type 2 DM, insulin only needed during pregnancy  . Chronic kidney disease     proteinuria    Past Surgical History  Procedure Laterality Date  . Breast reduction surgery  1994  . Dermoid tumor  2000  . Esophagogastroduodenoscopy  07/01/2009    [id number] esphagus without barrett's/dilation with 16 mm/mild erthyema in the antrum. mild chronic gastritis on path.     Current Outpatient Prescriptions  Medication Sig Dispense Refill  . ARIPiprazole (ABILIFY) 5 MG tablet Take 1 tablet (5 mg total) by mouth daily.  30 tablet  2  . Canagliflozin 300 MG TABS Take 1 tablet (300 mg total) by mouth daily.  30 tablet  3  . diphenoxylate-atropine (LOMOTIL) 2.5-0.025 MG per tablet Take 1 tablet by mouth 4 (four) times daily as needed for diarrhea or loose stools.  10 tablet  0  .  EPINEPHrine (EPIPEN) 0.3 mg/0.3 mL SOAJ injection Inject 0.3 mg into the muscle once as needed. Allergic reaction      . glipiZIDE (GLUCOTROL XL) 10 MG 24 hr tablet TAKE ONE TABLET BY MOUTH ONCE DAILY WITH BREAKFAST  30 tablet  3  . ibuprofen (ADVIL,MOTRIN) 800 MG tablet Take 1 tablet (800 mg total) by mouth 3 (three) times daily.  21 tablet  0  . Iron-FA-B Cmp-C-Biot-Probiotic (FUSION PLUS) CAPS Take 1 capsule by mouth daily before breakfast.  30 capsule  5  . LORATADINE PO Take by mouth. otc      . metFORMIN (GLUCOPHAGE) 1000 MG tablet Take 1,000 mg by mouth 2 (two) times daily with a meal.      . metFORMIN (GLUCOPHAGE-XR) 500 MG 24 hr tablet TAKE FOUR TABLETS BY MOUTH ONCE DAILY WITH  BREAKFAST  120 tablet  1  . montelukast (SINGULAIR) 10 MG tablet Take 1 tablet (10 mg total) by mouth at bedtime.  30 tablet  3  . omeprazole (PRILOSEC) 20 MG capsule Take 20 mg by mouth daily.      . Triamcinolone Acetonide (NASACORT ALLERGY 24HR NA) Place into the nose.      . triamterene-hydrochlorothiazide (DYAZIDE) 50-25 MG per capsule Take 1 capsule by mouth every morning.  30 capsule  11  . ondansetron (ZOFRAN) 4 MG tablet Take 1 tablet (4 mg total) by mouth daily as needed for nausea or vomiting.  6 tablet  0  . predniSONE (DELTASONE) 5 MG tablet Take 1 tablet (5 mg total) by mouth 2 (two) times daily with a meal.  10 tablet  0  . predniSONE (DELTASONE) 50 MG tablet Take 1 tablet (50 mg total) by mouth daily.  5 tablet  0   No current facility-administered medications for this visit.    Allergies as of 03/30/2014 - Review Complete 03/30/2014  Allergen Reaction Noted  . 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 Peanut-containing drug products Anaphylaxis 02/08/2013  . Amoxicillin-pot clavulanate Nausea And Vomiting 04/07/2013  . Other  06/13/2013    Family History  Problem Relation Age of Onset  . Diabetes Mother   . Hypertension Mother   . Fibromyalgia Mother   . Diabetes Father   . Hypertension  Father   . Asthma Father   . Colon cancer Neg Hx     History   Social History  . Marital Status: Married    Spouse Name: N/A    Number of Children: N/A  . Years of Education: N/A   Social History Main Topics  . Smoking status: Never Smoker   . Smokeless tobacco: Never Used  . Alcohol Use: No  . Drug Use: No  . Sexual Activity: Yes    Partners: Male    Birth Control/ Protection: None     Comment: Last Intercourse 08-13-13   Other Topics Concern  . None   Social History Narrative  . None    Review of Systems: As mentioned in HPI.   Physical Exam: BP 129/86  Pulse 84  Temp(Src) 98.4 F (36.9 C) (Oral)  Ht 5\' 5"  (1.651 m)  Wt 229 lb (103.874 kg)  BMI 38.11 kg/m2  LMP 03/15/2014 General:   Alert and oriented. No distress noted. Pleasant and cooperative.  Head:  Normocephalic and atraumatic. Eyes:  Conjuctiva clear without scleral icterus. Mouth:  Oral mucosa pink and moist. Good dentition. No lesions. Neck:  Supple, without mass or thyromegaly. Heart:  S1, S2 present without murmurs, rubs, or gallops. Regular rate and rhythm. Abdomen:  +BS, soft, non-tender and non-distended. No rebound or guarding. No HSM or masses noted. Msk:  Symmetrical without gross deformities. Normal posture. Extremities:  Without edema. Neurologic:  Alert and  oriented x4;  grossly normal neurologically. Skin:  Intact without significant lesions or rashes. Cervical Nodes:  No significant cervical adenopathy. Psych:  Alert and cooperative. Normal mood and affect.

## 2014-03-30 NOTE — Assessment & Plan Note (Signed)
38 year old female with history of rectal bleeding in the setting of recent diarrheal illness, with diarrhea resolving after changing from well water to bottled water. Stool studies on file and negative. Now back to baseline. Likely benign anorectal source but patient desires colonoscopy, which is certainly appropriate for further evaluation.  Proceed with colonoscopy with Dr. Oneida Alar in the near future. The risks, benefits, and alternatives have been discussed in detail with the patient. They state understanding and desire to proceed.

## 2014-03-30 NOTE — Assessment & Plan Note (Signed)
With history of gastroparesis. No vomiting or concerning signs. Start Zofran with meals. Would like to avoid Reglan unless absolutely needed.

## 2014-03-30 NOTE — Patient Instructions (Signed)
Take Zofran with meals and bedtime. You may taper this down as needed to lowest dose effective.  We have scheduled you for a colonoscopy with Dr. Oneida Alar in the near future.

## 2014-04-01 ENCOUNTER — Encounter: Payer: Self-pay | Admitting: Gastroenterology

## 2014-04-02 ENCOUNTER — Other Ambulatory Visit: Payer: Self-pay | Admitting: Gastroenterology

## 2014-04-02 MED ORDER — OMEPRAZOLE 20 MG PO CPDR: 20.0000 mg | DELAYED_RELEASE_CAPSULE | Freq: Every day | ORAL | Status: DC

## 2014-04-04 NOTE — Progress Notes (Signed)
Cc to pcp °

## 2014-04-11 ENCOUNTER — Ambulatory Visit (HOSPITAL_COMMUNITY): Admission: RE | Admit: 2014-04-11 | Payer: Medicaid Other | Source: Ambulatory Visit | Admitting: Gastroenterology

## 2014-04-11 ENCOUNTER — Telehealth: Payer: Self-pay | Admitting: Family Medicine

## 2014-04-11 ENCOUNTER — Encounter (HOSPITAL_COMMUNITY): Admission: RE | Payer: Self-pay | Source: Ambulatory Visit

## 2014-04-11 SURGERY — COLONOSCOPY
Anesthesia: Moderate Sedation

## 2014-04-11 NOTE — Telephone Encounter (Signed)
Lab order faxed. Tried to call patient back to let her know but no answer

## 2014-04-17 LAB — COMPLETE METABOLIC PANEL WITH GFR
ALT: 23 U/L (ref 0–35)
AST: 24 U/L (ref 0–37)
Albumin: 4.4 g/dL (ref 3.5–5.2)
Alkaline Phosphatase: 58 U/L (ref 39–117)
BUN: 12 mg/dL (ref 6–23)
CO2: 26 mEq/L (ref 19–32)
Calcium: 9.3 mg/dL (ref 8.4–10.5)
Chloride: 104 mEq/L (ref 96–112)
Creat: 0.93 mg/dL (ref 0.50–1.10)
GFR, Est African American: >89 mL/min
GFR, Est Non African American: 78 mL/min
Glucose, Bld: 160 mg/dL — ABNORMAL HIGH (ref 70–99)
Potassium: 4.2 mEq/L (ref 3.5–5.3)
Sodium: 137 mEq/L (ref 135–145)
Total Bilirubin: 0.6 mg/dL (ref 0.2–1.2)
Total Protein: 7.1 g/dL (ref 6.0–8.3)

## 2014-04-17 LAB — HEMOGLOBIN A1C
Hgb A1c MFr Bld: 7.7 % — ABNORMAL HIGH (ref <5.7)
Mean Plasma Glucose: 174 mg/dL — ABNORMAL HIGH (ref <117)

## 2014-04-18 ENCOUNTER — Ambulatory Visit (INDEPENDENT_AMBULATORY_CARE_PROVIDER_SITE_OTHER): Payer: Medicaid Other | Admitting: Family Medicine

## 2014-04-18 ENCOUNTER — Encounter: Payer: Self-pay | Admitting: Family Medicine

## 2014-04-18 VITALS — BP 122/64 | HR 100 | Resp 18 | Ht 65.0 in | Wt 232.0 lb

## 2014-04-18 DIAGNOSIS — F319 Bipolar disorder, unspecified: Secondary | ICD-10-CM

## 2014-04-18 DIAGNOSIS — E049 Nontoxic goiter, unspecified: Secondary | ICD-10-CM

## 2014-04-18 DIAGNOSIS — R809 Proteinuria, unspecified: Secondary | ICD-10-CM

## 2014-04-18 DIAGNOSIS — M25559 Pain in unspecified hip: Secondary | ICD-10-CM

## 2014-04-18 DIAGNOSIS — IMO0001 Reserved for inherently not codable concepts without codable children: Secondary | ICD-10-CM

## 2014-04-18 DIAGNOSIS — R102 Pelvic and perineal pain: Secondary | ICD-10-CM

## 2014-04-18 DIAGNOSIS — I10 Essential (primary) hypertension: Secondary | ICD-10-CM

## 2014-04-18 DIAGNOSIS — E669 Obesity, unspecified: Secondary | ICD-10-CM

## 2014-04-18 DIAGNOSIS — E1165 Type 2 diabetes mellitus with hyperglycemia: Secondary | ICD-10-CM

## 2014-04-18 DIAGNOSIS — N949 Unspecified condition associated with female genital organs and menstrual cycle: Secondary | ICD-10-CM

## 2014-04-18 MED ORDER — GLIPIZIDE ER 5 MG PO TB24: 5.0000 mg | ORAL_TABLET | Freq: Every day | ORAL | Status: DC

## 2014-04-18 MED ORDER — METFORMIN HCL 500 MG PO TABS: 2000.0000 mg | ORAL_TABLET | Freq: Every day | ORAL | Status: DC

## 2014-04-18 NOTE — Patient Instructions (Addendum)
F/u  In end December, call if you need me before  ALL the best as you work on improved health, you CAN do this, one day at a time  HBA1C, chem 7 and EGFR, end December non fast  Goal for fasting blood sugar ranges from 80 to 120 and 2 hours after any meal or at bedtime should be between 130 to 170.   It is important that you exercise regularly at least 30 minutes 5 times a week. If you develop chest pain, have severe difficulty breathing, or feel very tired, stop exercising immediately and seek medical attention   Increase in gliopizide dose to 15 mg (total ) once daily, you will take one 70m tablet and one 5 mg tablet, both long acting at the same time together  Metformin dose stay the same  Call with questions  Referrals for pelvic and thyroid [place]

## 2014-04-20 ENCOUNTER — Telehealth: Payer: Self-pay | Admitting: Family Medicine

## 2014-04-20 DIAGNOSIS — R102 Pelvic and perineal pain: Secondary | ICD-10-CM | POA: Insufficient documentation

## 2014-04-20 NOTE — Assessment & Plan Note (Signed)
Deteriorated. Patient re-educated about  the importance of commitment to a  minimum of 150 minutes of exercise per week. The importance of healthy food choices with portion control discussed. Encouraged to start a food diary, count calories and to consider  joining a support group. Sample diet sheets offered. Goals set by the patient for the next several months.    

## 2014-04-20 NOTE — Assessment & Plan Note (Signed)
2 month h/o pelvic pain and pressure, reports h/o fibroid, will order [place] to further evaluate

## 2014-04-20 NOTE — Assessment & Plan Note (Signed)
Needs [place] to fully evaluate structure, will order

## 2014-04-20 NOTE — Assessment & Plan Note (Signed)
Controlled, no change in medication DASH diet and commitment to daily physical activity for a minimum of 30 minutes discussed and encouraged, as a part of hypertension management. The importance of attaining a healthy weight is also discussed. Needs to start ACE for renal protection

## 2014-04-20 NOTE — Telephone Encounter (Signed)
Pls call pt the WEEK of 09/28, let her know that I recommend she start a low dose ACE per national guidelines , which recommends this for all diabetics for protection of kidney function. I have entered altace/ramipiril, send after you spk with her .Marland Kitchen PLS do NOT contact her /start med before 09/28, i do not want to start a new med when I am out of office

## 2014-04-20 NOTE — Progress Notes (Signed)
Subjective:    Patient ID: Christina [name], female    DOB: Mar 24, 1976, 38 y.o.   MRN: [id number]  HPI The PT is here for follow up and re-evaluation of chronic medical conditions, medication management and review of any available recent lab and radiology data.  Preventive health is updated, specifically  Cancer screening and Immunization.   Questions or concerns regarding consultations or procedures which the PT has had in the interim are  addressed. The PT denies any adverse reactions to current medications since the last visit.  2 to 4 week h/o increased pelvic pain and pressure and states she sometimes experiences difficulty with voiding.Reports that she was told that she had a small fibroid and she wants this checked C/o increased mental and physical stress now that both she and her spouse work full time, live independentlyu and the family is now 24. Does have great support from family, but ie feeling overwhelmed and as if things are spiralling out of control, will call ger psychiatrist re med management of her established bipolar disease    Review of Systems ,ch1 Denies recent fever or chills. Denies sinus pressure, nasal congestion, ear pain or sore throat. Denies chest congestion, productive cough or wheezing. Denies chest pains, palpitations and leg swelling Denies abdominal pain, nausea, vomiting,diarrhea or constipation.    Denies joint pain, swelling and limitation in mobility. Denies headaches, seizures, numbness, or tingling.  Denies skin break down or rash.        Objective:   Physical Exam  BP 122/64  Pulse 100  Resp 18  Ht 5\' 5"  (1.651 m)  Wt 232 lb (105.235 kg)  BMI 38.61 kg/m2  SpO2 97%  LMP 03/15/2014 Patient alert and oriented and in no cardiopulmonary distress.  HEENT: No facial asymmetry, EOMI,   oropharynx pink and moist.  Neck supple no JVD, thyromegaly, TM clear bilaterally.  Chest: Clear to auscultation bilaterally.  CVS: S1, S2 no murmurs, no  S3.Regular rate.  ABD: Soft lower abdominal tenderness, no guarding or rebound, normal BS. No renal angle tenderness  Ext: No edema  MS: Adequate ROM spine, shoulders, hips and knees.  Skin: Intact, no ulcerations or rash noted.  Psych: Good eye contact, normal affect. Memory intact not anxious or depressed appearing.  CNS: CN 2-12 intact, power,  normal throughout.no focal deficits noted.       Assessment & Plan:  Hypertension goal BP (blood pressure) < 130/80 Controlled, no change in medication DASH diet and commitment to daily physical activity for a minimum of 30 minutes discussed and encouraged, as a part of hypertension management. The importance of attaining a healthy weight is also discussed. 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 The fact that changes in all these areas will reduce or eliminate all together the development of diabetes is stressed.   Glipizide dose increased and she needs to resume dalyt testing Updated lab needed at/ before next visit.    BIPOLAR DISORDER UNSPECIFIED Needs increased  Therapy now that she is being challenged by the balancing act of full time employment, parenting and being  a wife. Is considering adding a 2nd antidepressant which she has been on  in the past. not suicidal or homicidal, but feels overwhelmed at times and is tearful \\Shewill  be in touch with her psychiatrist  OBESITY Deteriorated. Patient re-educated about  the importance of commitment to a  minimum of 150 minutes of exercise per  week. The importance of healthy food choices with portion control discussed. Encouraged to start a food diary, count calories and to consider  joining a support group. Sample diet sheets offered. Goals set by the patient for the next several months.     Pelvic pain in female 2 month h/o pelvic pain and pressure, reports h/o fibroid, will order [place] to further evaluate  Microalbuminuria Needs low dose ACE per std guidelines, will start ACE in 2 weeks, nurse to notify pt at that time, for renal protection

## 2014-04-20 NOTE — Assessment & Plan Note (Signed)
Needs low dose ACE per std guidelines, will start ACE in 2 weeks, nurse to notify pt at that time, for renal protection

## 2014-04-20 NOTE — Assessment & Plan Note (Signed)
Deteriorated, pt has d/c invokana afraid of possible adverse side  Effects No regular exercise ,a nd still working n diet control Patient educated about the importance of limiting  Carbohydrate intake , the need to commit to daily physical activity for a minimum of 30 minutes , and to commit weight loss. The fact that changes in all these areas will reduce or eliminate all together the development of diabetes is stressed.   Glipizide dose increased and she needs to resume dalyt testing Updated lab needed at/ before next visit.

## 2014-04-20 NOTE — Assessment & Plan Note (Signed)
Needs increased  Therapy now that she is being challenged by the balancing act of full time employment, parenting and being a wife. Is considering adding a 2nd antidepressant which she has been on  in the past. not suicidal or homicidal, but feels overwhelmed at times and is tearful \\Shewill  be in touch with her psychiatrist

## 2014-04-23 ENCOUNTER — Emergency Department (HOSPITAL_COMMUNITY)
Admission: EM | Admit: 2014-04-23 | Discharge: 2014-04-23 | Disposition: A | Discharge status: Home / Self Care | Payer: Medicaid Other | Source: Home / Self Care | Attending: Family Medicine | Admitting: Family Medicine

## 2014-04-23 ENCOUNTER — Encounter (HOSPITAL_COMMUNITY): Payer: Self-pay | Admitting: Emergency Medicine

## 2014-04-23 DIAGNOSIS — B349 Viral infection, unspecified: Secondary | ICD-10-CM

## 2014-04-23 DIAGNOSIS — R0981 Nasal congestion: Secondary | ICD-10-CM

## 2014-04-23 DIAGNOSIS — J3489 Other specified disorders of nose and nasal sinuses: Secondary | ICD-10-CM | POA: Diagnosis not present

## 2014-04-23 DIAGNOSIS — Z9109 Other allergy status, other than to drugs and biological substances: Secondary | ICD-10-CM

## 2014-04-23 DIAGNOSIS — B9789 Other viral agents as the cause of diseases classified elsewhere: Secondary | ICD-10-CM | POA: Diagnosis not present

## 2014-04-23 MED ORDER — METHYLPREDNISOLONE ACETATE 80 MG/ML IJ SUSP
80.0000 mg | Freq: Once | INTRAMUSCULAR | Status: AC
  Administered 2014-04-23: 80 mg via INTRAMUSCULAR

## 2014-04-23 MED ORDER — METHYLPREDNISOLONE ACETATE 80 MG/ML IJ SUSP
INTRAMUSCULAR | Status: AC
  Filled 2014-04-23: qty 1

## 2014-04-23 NOTE — Discharge Instructions (Signed)
Viral Infections A virus is a type of germ. Viruses can cause:  Minor sore throats.  Aches and pains.  Headaches.  Runny nose.  Rashes.  Watery eyes.  Tiredness.  Coughs.  Loss of appetite.  Feeling sick to your stomach (nausea).  Throwing up (vomiting).  Watery poop (diarrhea). HOME CARE   Only take medicines as told by your doctor.  Drink enough water and fluids to keep your pee (urine) clear or pale yellow. Sports drinks are a good choice.  Get plenty of rest and eat healthy. Soups and broths with crackers or rice are fine. GET HELP RIGHT AWAY IF:   You have a very bad headache.  You have shortness of breath.  You have chest pain or neck pain.  You have an unusual rash.  You cannot stop throwing up.  You have watery poop that does not stop.  You cannot keep fluids down.  You or your child has a temperature by mouth above 102 F (38.9 C), not controlled by medicine.  Your baby is older than 3 months with a rectal temperature of 102 F (38.9 C) or higher.  Your baby is 27 months old or younger with a rectal temperature of 100.4 F (38 C) or higher. MAKE SURE YOU:   Understand these instructions.  Will watch this condition.  Will get help right away if you are not doing well or get worse. Document Released: 07/02/2008 Document Revised: 10/12/2011 Document Reviewed: 11/25/2010 [facility] Patient Information 2015 [place], [place]. This information is not intended to replace advice given to you by your health care provider. Make sure you discuss any questions you have with your health care provider.    Mucinex Max strength, Claritin once a day, Motrin as needed. Lots of fluids and rest

## 2014-04-23 NOTE — ED Provider Notes (Signed)
CSN: [id number]     Arrival date & time 04/23/14  1452 History   First MD Initiated Contact with Patient 04/23/14 1648     Chief Complaint  Patient presents with  . Facial Pain   (Consider location/radiation/quality/duration/timing/severity/associated sxs/prior Treatment) HPI Comments: Patient presents with a 24 hour history of nasal congestion, ear pressure, and post nasal drip. No fevers or chills. No cough. Mild allergies.   The history is provided by the patient.    Past Medical History  Diagnosis Date  . GERD (gastroesophageal reflux disease)   . Bipolar disorder   . Gastroparesis   . 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 Hypertension     chronic on meds  . Diabetes mellitus     type 2 DM, insulin only needed during pregnancy  . Chronic kidney disease     proteinuria   Past Surgical History  Procedure Laterality Date  . Breast reduction surgery  1994  . Dermoid tumor  2000  . Esophagogastroduodenoscopy  07/01/2009    [id number] esphagus without barrett's/dilation with 16 mm/mild erthyema in the antrum. mild chronic gastritis on path.    Family History  Problem Relation Age of Onset  . Diabetes Mother   . Hypertension Mother   . Fibromyalgia Mother   . Diabetes Father   . Hypertension Father   . Asthma Father   . Colon cancer Neg Hx    History  Substance Use Topics  . Smoking status: Never Smoker   . Smokeless tobacco: Never Used  . Alcohol Use: No   OB History   Grav Para Term Preterm Abortions TAB SAB Ect Mult Living   2 2 1 1      2      Review of Systems  All other systems reviewed and are negative.   Allergies  Peanut oil; Peanut-containing drug products; Amoxicillin-pot clavulanate; Invokana; and Other  Home Medications   Prior to Admission medications   Medication Sig Start Date End Date Taking? Authorizing Provider  ARIPiprazole (ABILIFY) 5 MG tablet Take 5 mg by mouth daily.   Yes Historical Provider, MD  glipiZIDE (GLUCOTROL XL) 10 MG  24 hr tablet Take 10 mg by mouth daily with breakfast.   Yes Historical Provider, MD  glipiZIDE (GLUCOTROL XL) 5 MG 24 hr tablet Take 1 tablet (5 mg total) by mouth daily with breakfast. 04/18/14  Yes [name], MD  metFORMIN (GLUCOPHAGE) 500 MG tablet Take 4 tablets (2,000 mg total) by mouth daily with breakfast. 04/18/14  Yes [name], MD  omeprazole (PRILOSEC) 20 MG capsule Take 20 mg by mouth daily.   Yes Historical Provider, MD  ramipril (ALTACE) 5 MG capsule Take 1 capsule (5 mg total) by mouth daily. 04/20/14  Yes [name], MD  triamterene-hydrochlorothiazide (DYAZIDE) 50-25 MG per capsule Take 1 capsule by mouth every morning.   Yes Historical Provider, MD  montelukast (SINGULAIR) 10 MG tablet Take 10 mg by mouth at bedtime.    Historical Provider, MD  ondansetron (ZOFRAN) 4 MG tablet Take 4 mg by mouth 4 (four) times daily -  before meals and at bedtime.    Historical Provider, MD   BP 128/91  Pulse 95  Temp(Src) 98 F (36.7 C) (Oral)  Resp 16  SpO2 100%  LMP 04/19/2014  Breastfeeding? No Physical Exam  Nursing note and vitals reviewed. Constitutional: She is oriented to person, place, and time. She appears well-developed and well-nourished. No distress.  HENT:  Head: Normocephalic and atraumatic.  Right Ear: External ear normal.  Left Ear: External ear normal.  Mouth/Throat: Oropharynx is clear and moist. No oropharyngeal exudate.  Mild turbinate swelling and erythema.  clear drainage  Eyes: Pupils are equal, round, and reactive to light. Right eye exhibits no discharge. Left eye exhibits no discharge.  Neck: Normal range of motion.  Cardiovascular: Normal rate and regular rhythm.   Pulmonary/Chest: Effort normal and breath sounds normal. No respiratory distress. She has no wheezes.  Lymphadenopathy:    She has no cervical adenopathy.  Neurological: She is alert and oriented to person, place, and time.  Skin: Skin is warm and dry. She is not  diaphoretic.  Psychiatric: Her behavior is normal.    ED Course  Procedures (including critical care time) Labs Review Labs Reviewed - No data to display  Imaging Review No results found.   MDM   1. Environmental allergies   2. Nasal congestion   3. Viral illness    No indication for an antibiotic is needed today. She request "injection" as given before. Ok for steroid shot; but keep an eye on sugars. Expresses understanding. Symptomatic relief with OTC remedies.     Bjorn Pippin, PA-C 04/23/14 1704

## 2014-04-23 NOTE — ED Notes (Signed)
C/o  Sinus pressure and pain.   Low grade temp.  Sore throat.  Cough.  Nasal congestion.  And ear pain.   Symptoms present since 9/20.   No relief with otc meds.  Denies n/v/d

## 2014-04-24 ENCOUNTER — Other Ambulatory Visit: Payer: Self-pay | Admitting: Family Medicine

## 2014-04-24 DIAGNOSIS — M25559 Pain in unspecified hip: Secondary | ICD-10-CM

## 2014-04-24 NOTE — ED Provider Notes (Signed)
Medical screening examination/treatment/procedure(s) were performed by resident physician or non-physician practitioner and as supervising physician I was immediately available for consultation/collaboration.   Pauline Good MD.   Billy Fischer, MD 04/24/14 [phone]

## 2014-04-25 ENCOUNTER — Ambulatory Visit (HOSPITAL_COMMUNITY)
Admission: RE | Admit: 2014-04-25 | Discharge: 2014-04-25 | Disposition: A | Discharge status: Home / Self Care | Payer: Medicaid Other | Source: Ambulatory Visit | Attending: Family Medicine | Admitting: Family Medicine

## 2014-04-25 ENCOUNTER — Ambulatory Visit (HOSPITAL_COMMUNITY): Payer: Medicaid Other

## 2014-04-25 DIAGNOSIS — N72 Inflammatory disease of cervix uteri: Secondary | ICD-10-CM | POA: Diagnosis not present

## 2014-04-25 DIAGNOSIS — N838 Other noninflammatory disorders of ovary, fallopian tube and broad ligament: Secondary | ICD-10-CM | POA: Diagnosis not present

## 2014-04-25 DIAGNOSIS — E041 Nontoxic single thyroid nodule: Secondary | ICD-10-CM | POA: Insufficient documentation

## 2014-04-25 DIAGNOSIS — M25559 Pain in unspecified hip: Secondary | ICD-10-CM

## 2014-04-25 DIAGNOSIS — E049 Nontoxic goiter, unspecified: Secondary | ICD-10-CM

## 2014-04-30 ENCOUNTER — Encounter: Payer: Self-pay | Admitting: Family Medicine

## 2014-04-30 ENCOUNTER — Other Ambulatory Visit: Payer: Self-pay | Admitting: Family Medicine

## 2014-04-30 DIAGNOSIS — N838 Other noninflammatory disorders of ovary, fallopian tube and broad ligament: Secondary | ICD-10-CM

## 2014-04-30 DIAGNOSIS — R102 Pelvic and perineal pain: Secondary | ICD-10-CM

## 2014-05-01 ENCOUNTER — Other Ambulatory Visit: Payer: Self-pay

## 2014-05-01 MED ORDER — ACCU-CHEK AVIVA PLUS W/DEVICE KIT: PACK | Status: DC

## 2014-05-01 MED ORDER — MONTELUKAST SODIUM 10 MG PO TABS: 10.0000 mg | ORAL_TABLET | Freq: Every day | ORAL | Status: DC

## 2014-05-02 NOTE — Telephone Encounter (Signed)
I have not ever recommended birth control birth control for the pt, it was the ACE inhibitor which is for renal protection, pls contact Dr Florene Glen, her nephrologist to see if he believes she can take the ACE or not  I will write a note , you fax, spk with nurse to bring to his attention , then call back (have not had success with sending flags)

## 2014-05-02 NOTE — Telephone Encounter (Signed)
Patient states that she is not on birth control and she was told by nephrologist that she should stay away from these.

## 2014-05-03 ENCOUNTER — Ambulatory Visit: Payer: Medicaid Other | Admitting: Obstetrics & Gynecology

## 2014-05-05 ENCOUNTER — Encounter: Payer: Self-pay | Admitting: Family Medicine

## 2014-05-05 ENCOUNTER — Other Ambulatory Visit: Payer: Self-pay | Admitting: Family Medicine

## 2014-05-05 ENCOUNTER — Telehealth: Payer: Self-pay | Admitting: Family Medicine

## 2014-05-05 NOTE — Telephone Encounter (Signed)
Pls see e mail msg to pt , call and let her know directly also pls, I have been unable to reach her, I have also removed the med form her list, thanks

## 2014-05-07 NOTE — Telephone Encounter (Signed)
Patient is aware 

## 2014-05-21 ENCOUNTER — Encounter: Payer: Self-pay | Admitting: Obstetrics & Gynecology

## 2014-05-21 ENCOUNTER — Ambulatory Visit (INDEPENDENT_AMBULATORY_CARE_PROVIDER_SITE_OTHER): Payer: Medicaid Other | Admitting: Obstetrics & Gynecology

## 2014-05-21 ENCOUNTER — Encounter: Payer: Self-pay | Admitting: Gastroenterology

## 2014-05-21 ENCOUNTER — Other Ambulatory Visit: Payer: Self-pay | Admitting: Obstetrics & Gynecology

## 2014-05-21 VITALS — BP 133/94 | HR 105 | Temp 98.0°F | Ht 65.0 in | Wt 230.0 lb

## 2014-05-21 DIAGNOSIS — N760 Acute vaginitis: Secondary | ICD-10-CM

## 2014-05-21 DIAGNOSIS — R102 Pelvic and perineal pain: Secondary | ICD-10-CM

## 2014-05-21 MED ORDER — TERCONAZOLE 0.4 % VA CREA: 1.0000 | TOPICAL_CREAM | Freq: Every day | VAGINAL | Status: DC

## 2014-05-21 NOTE — Patient Instructions (Signed)
Dyspareunia Dyspareunia is pain during sexual intercourse. It is most common in women, but it also happens in men.  CAUSES  Female The pain from this condition is usually felt when anything is put into the vagina, but any part of the genitals may cause pain during sex. Even sitting or wearing pants can cause pain. Sometimes, a cause cannot be found. Some causes of pain during intercourse are:  Infections of the skin around the vagina.  Vaginal infections, such as a yeast, bacterial, or viral infection.  Vaginismus. This is the inability to have anything put in the vagina even when the woman wants it to happen. There is an automatic muscle contraction and pain. The pain of the muscle contraction can be so severe that intercourse is impossible.  Allergic reaction from spermicides, semen, condoms, scented tampons, soaps, douches, and vaginal sprays.  A fluid-filled sac (cyst) on the Bartholin or Skene glands, located at the opening of the vagina.  Scar tissue in the vagina from a surgically enlarged opening (episiotomy) or tearing after delivering a baby.  Vaginal dryness. This is more common in menopause. The normal secretions of the vagina are decreased. Changes in estrogen levels and increased difficulty becoming aroused can cause painful sex. Vaginal dryness can also happen when taking birth control pills.  Thinning of the tissue (atrophy) of the vulva and vagina. This makes the area thinner, smaller, unable to stretch to accommodate a penis, and prone to infection and tearing.  Vulvar vestibulitis or vestibulodynia.This is a condition that causes pain involving the area around the entrance to the vagina.The most common cause in young women is birth control pills.Women with low estrogen levels (postmenopausal women) may also experience this.Other causes include allergic reactions, too many nerve endings, skin conditions, and pelvic muscles that cannot relax.  Vulvar dermatoses. This  includes skin conditions such as lichen sclerosus and lichen planus.  Lack of foreplay to lubricate the vagina. This can cause vaginal dryness.  Noncancerous tumors (fibroids) in the uterus.  Uterus lining tissue growing outside the uterus (endometriosis).  Pregnancy that starts in the fallopian tube (tubal pregnancy).  Pregnancy or breastfeeding your baby. This can cause vaginal dryness.  A tilting or prolapse of the uterus. Prolapse is when weak and stretched muscles around the uterus allow it to fall into the vagina.  Problems with the ovaries, cysts, or scar tissue. This may be worse with certain sexual positions.  Previous surgeries causing adhesions or scar tissue in the vagina or pelvis.  Bladder and intestinal problems.  Psychological problems (such as depression or anxiety). This may make pain worse.  Negative attitudes about sex, experiencing rape, sexual assault, and misinformation about sex. These issues are often related to some types of pain.  Previous pelvic infection, causing scar tissue in the pelvis and on the female organs.  Cyst or tumor on the ovary.  Cancer of the female organs.  Certain medicines.  Medical problems such as diabetes, arthritis, or thyroid disease. Female In men, there are many physical causes of sexual discomfort. Some causes of pain during intercourse are:  Infections of the prostate, bladder, or seminal vesicles. This can cause pain after ejaculation.  An inflamed bladder (interstitial cystitis). This may cause pain from ejaculation.  Gonorrheal infections. This may cause pain during ejaculation.  An inflamed urethra (urethritis) or inflamed prostate (prostatitis). This can make genital stimulation painful or uncomfortable.  Deformities of the penis, such as Peyronie's disease.  A tight foreskin.  Cancer of the female organs.    Psychological problems. This may make pain worse. DIAGNOSIS   Your caregiver will take a history and  have you describe where the pain is located (outside the vagina, in the vagina, in the pelvis). You may be asked when you experience pain, such as with penetration or with thrusting.  Following this, your caregiver will do a physical exam. Let your caregiver know if the exam is too painful.  During the final part of the female exam, your caregiver will feel your uterus and ovaries with one hand on the abdomen and one finger in your vagina. This is a pelvic exam.  Blood tests, a Pap test, cultures for infection, an ultrasound test, and X-rays may be done. You may need to see a specialist for female problems (gynecologist).  Your caregiver may do a CT scan, MRI, or laparoscopy. Laparoscopy is a procedure to look into the pelvis with a lighted tube, through a cut (incision) in the abdomen. TREATMENT  Your caregiver can help you determine the best course of treatment. Sometimes, more testing is done. Continue with the suggested testing until your caregiver feels sure about your diagnosis and how to treat it. Sometimes, it is difficult to find the reason for the pain. The search for the cause and treatment can be frustrating. Treatment often takes several weeks to a few months before you notice any improvement. You may also need to avoid sexual activity until symptoms improve.Continuing to have sex when it hurts can delay healing and actually make the problem worse. The treatment depends on the cause of the pain. Treatment may include:  Medicines such as antibiotics, vaginal or skin creams, hormones, or antidepressants.  Minor or major surgery.  Psychological counseling or group therapy.  Kegel exercises and vaginal dilators to help certain cases of vaginismus (spasms). Do this only if recommended by your caregiver.Kegel exercises can make some problems worse.  Applying lubrication as recommended by your caregiver if you have dryness.  Sex therapy for you and your sex partner. It is common for  the pain to continue after the reason for the pain has been treated. Some reasons for this include a conditioned response. This means the person having the pain becomes so familiar with the pain that the pain continues as a response, even though the cause is removed. Sex therapy can help with this problem. HOME CARE INSTRUCTIONS   Follow your caregiver's instructions about taking medicines, tests, counseling, and follow-up treatment.  Do not use scented tampons, douches, vaginal sprays, or soaps.  Use water-based lubricants for dryness. Oil lubricants can cause irritation.  Do not use spermicides or condoms that irritate you.  Openly discuss with your partner your sexual experience, your desires, foreplay, and different sexual positions for a more comfortable and enjoyable sexual relationship.  Join group sessions for therapy, if needed.  Practice safe sex at all times.  Empty your bladder before having intercourse.  Try different positions during sexual intercourse.  Take over-the-counter pain medicine recommended by your caregiver before having sexual intercourse.  Do not wear pantyhose. Knee-high and thigh-high hose are okay.  Avoid scrubbing your vulva with a washcloth. Wash the area gently and pat dry with a towel. SEEK MEDICAL CARE IF:   You develop vaginal bleeding after sexual intercourse.  You develop a lump at the opening of your vagina, even if it is not painful.  You have abnormal vaginal discharge.  You have vaginal dryness.  You have itching or irritation of the vulva or vagina.  You   develop a rash or reaction to your medicine. °SEEK IMMEDIATE MEDICAL CARE IF:  °· You develop severe abdominal pain during or shortly after sexual intercourse. You could have a ruptured ovarian cyst or ruptured tubal pregnancy. °· You have a fever. °· You have painful or bloody urination. °· You have painful sexual intercourse, and you never had it before. °· You pass out after having  sexual intercourse. °Document Released: 08/09/2007 Document Revised: 10/12/2011 Document Reviewed: 10/20/2010 °ExitCare® Patient Information ©2015 [employer]. This information is not intended to replace advice given to you by your health care provider. Make sure you discuss any questions you have with your health care provider. ° °

## 2014-05-22 LAB — URINALYSIS, ROUTINE W REFLEX MICROSCOPIC
Bilirubin Urine: NEGATIVE
Glucose, UA: NEGATIVE mg/dL
Ketones, ur: NEGATIVE mg/dL
Leukocytes, UA: NEGATIVE
Nitrite: NEGATIVE
Protein, ur: 30 mg/dL — AB
Specific Gravity, Urine: 1.028 (ref 1.005–1.030)
Urobilinogen, UA: 0.2 mg/dL (ref 0.0–1.0)
pH: 6 (ref 5.0–8.0)

## 2014-05-22 LAB — URINALYSIS, MICROSCOPIC ONLY
Bacteria, UA: NONE SEEN
Casts: NONE SEEN

## 2014-05-23 LAB — URINE CULTURE: Colony Count: 15000

## 2014-05-23 NOTE — Progress Notes (Signed)
Patient ID: Christina [name], female   DOB: Sep 09, 1975, 38 y.o.   MRN: [id number]  Chief Complaint  Patient presents with  . Pelvic Pain    HPI Christina [name] is a 38 y.o. female.  A recent pelvic U/S showed ovaries w/subcentimenter follicles/cysts.  HPI  Past Medical History  Diagnosis Date  . GERD (gastroesophageal reflux disease)   . Bipolar disorder   . Gastroparesis   . 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 Hypertension     chronic on meds  . Diabetes mellitus     type 2 DM, insulin only needed during pregnancy  . Chronic kidney disease     proteinuria    Past Surgical History  Procedure Laterality Date  . Breast reduction surgery  1994  . Dermoid tumor  2000  . Esophagogastroduodenoscopy  07/01/2009    [id number] esphagus without barrett's/dilation with 16 mm/mild erthyema in the antrum. mild chronic gastritis on path.     Family History  Problem Relation Age of Onset  . Diabetes Mother   . Hypertension Mother   . Fibromyalgia Mother   . Diabetes Father   . Hypertension Father   . Asthma Father   . Colon cancer Neg Hx     Social History History  Substance Use Topics  . Smoking status: Never Smoker   . Smokeless tobacco: Never Used  . Alcohol Use: No    Allergies  Allergen Reactions  . Peanut Oil Anaphylaxis  . Peanut-Containing Drug Products Anaphylaxis  . Ace Inhibitors Cough    Pt on no contaception and is at reproductive age, discussed with nephrology also  . Amoxicillin-Pot Clavulanate Nausea And Vomiting    Stomach cramps  . Invokana [Canagliflozin] Other (See Comments)     Worried about ketoacidosis  . Other     Tree nuts    Current Outpatient Prescriptions  Medication Sig Dispense Refill  . ARIPiprazole (ABILIFY) 5 MG tablet Take 5 mg by mouth daily.      . Blood Glucose Monitoring Suppl (ACCU-CHEK AVIVA PLUS) W/DEVICE KIT 1 kit to test blood sugars daily dx 250.00  1 kit  0  . glipiZIDE (GLUCOTROL XL) 10 MG 24 hr tablet Take 10 mg by  mouth daily with breakfast.      . glipiZIDE (GLUCOTROL XL) 5 MG 24 hr tablet Take 1 tablet (5 mg total) by mouth daily with breakfast.  30 tablet  4  . metFORMIN (GLUCOPHAGE) 500 MG tablet Take 4 tablets (2,000 mg total) by mouth daily with breakfast.  120 tablet  3  . montelukast (SINGULAIR) 10 MG tablet Take 1 tablet (10 mg total) by mouth at bedtime.  30 tablet  3  . omeprazole (PRILOSEC) 20 MG capsule Take 20 mg by mouth daily.      Marland Kitchen triamterene-hydrochlorothiazide (DYAZIDE) 50-25 MG per capsule Take 1 capsule by mouth every morning.      Marland Kitchen terconazole (TERAZOL 7) 0.4 % vaginal cream Place 1 applicator vaginally at bedtime. For 2 weeks  90 g  0   No current facility-administered medications for this visit.    Review of Systems Review of Systems Constitutional: negative for fatigue and weight loss Respiratory: negative for cough and wheezing Cardiovascular: negative for chest pain, fatigue and palpitations Gastrointestinal: negative for abdominal pain and change in bowel habits Genitourinary:positive for vaginal itching Integument/breast: negative for nipple discharge Musculoskeletal:negative for myalgias Neurological: negative for gait problems and tremors Behavioral/Psych: negative for abusive relationship, depression Endocrine: negative  for temperature intolerance     Blood pressure 133/94, pulse 105, temperature 98 F (36.7 C), height 5\' 5"  (1.651 m), weight 104.327 kg (230 lb), last menstrual period 05/18/2014, not currently breastfeeding.  Physical Exam Physical Exam General:   alert  Skin:   no rash or abnormalities  Lungs:   clear to auscultation bilaterally  Heart:   regular rate and rhythm, S1, S2 normal, no murmur, click, rub or gallop  Breasts:   normal without suspicious masses, skin or nipple changes or axillary nodes  Abdomen:  normal findings: no organomegaly, soft, non-tender and no hernia  Pelvis:  External genitalia: normal general appearance Urinary  system: urethral meatus normal and bladder without fullness, nontender Vaginal: curd-like vaginal discharge Cervix: normal appearance Adnexa: normal bimanual exam Uterus: anteverted and non-tender, normal size      Data Reviewed Pelvic U/S  Assessment    ?Eitiology of complaints  Likely candida vulvovaginits--colonization vs reinfection in the setting of poorly controlled DM  Plan    Orders Placed This Encounter  Procedures  . GC/Chlamydia Probe Amp  . Urine culture  . Urinalysis, Routine w reflex microscopic  . Urine Microscopic   Meds ordered this encounter  Medications  . terconazole (TERAZOL 7) 0.4 % vaginal cream    Sig: Place 1 applicator vaginally at bedtime. For 2 weeks    Dispense:  90 g    Refill:  0    Follow up as needed.         JACKSON-MOORE,Cashe Gatt A 05/23/2014, 9:08 PM

## 2014-05-29 ENCOUNTER — Telehealth: Payer: Self-pay | Admitting: *Deleted

## 2014-05-29 NOTE — Telephone Encounter (Signed)
Pt called to office regarding lab results.  Pt made aware of results.  Pt made aware that STD testing has not yet resulted in system.  Pt made aware that lab has been notified in order to obtain results.  Pt has no further concerns.

## 2014-05-30 ENCOUNTER — Ambulatory Visit (INDEPENDENT_AMBULATORY_CARE_PROVIDER_SITE_OTHER): Payer: 59 | Admitting: Psychiatry

## 2014-05-30 ENCOUNTER — Encounter (HOSPITAL_COMMUNITY): Payer: Self-pay | Admitting: Psychiatry

## 2014-05-30 VITALS — BP 132/94 | HR 103 | Ht 65.0 in | Wt 233.8 lb

## 2014-05-30 DIAGNOSIS — F319 Bipolar disorder, unspecified: Secondary | ICD-10-CM

## 2014-05-30 MED ORDER — ARIPIPRAZOLE 5 MG PO TABS: 5.0000 mg | ORAL_TABLET | Freq: Every day | ORAL | Status: DC

## 2014-05-30 NOTE — Progress Notes (Signed)
[facility] Progress Note  KASHVI PREVETTE [id number] 38 y.o.  05/30/2014 2:03 PM  Chief Complaint:  Medication management and followup.     History of Present Illness:  Jake came for her appointment.  She is compliant with Abilify 5 mg daily.  Recently she's seen her primary care physician and she was disappointed because her hemoglobin A1c is above 7.  She admitted not doing exercise and not watching her calorie intake.  She denies any irritability or any anger.  She is excited because she is getting new full-time job and [employer] system.  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  She will start her job on November 2.  She denies any side effects of medication.  She continues to have issue with her mother-in-law but it is less intense since she moved out from her house.  Patient denies any crying spells, hallucination.  Her sleep is good.  She admitted increased weight gain because she is not watching her calorie intake.  She denies any drinking or using any illegal substances.  She denies any tremors or shakes.  She had a good support from her husband.    Suicidal Ideation: No Plan Formed: No Patient has means to carry out plan: No  Homicidal Ideation: No Plan Formed: No Patient has means to carry out plan: No  Medical History; Her primary care physician is Dr. Tula Nakayama.  Patient has history of diabetes , hypertension, gastroparesis, goiter and GERD   Past Psychiatric History/Hospitalization(s) Patient has been seeing in this office in 2006. She was referred from her primary care physician Dr. Moshe Cipro. She has history of depression and mania and psychosis. In 1997 she was admitted at [facility] because of suicidal gesture.  She hit her leg with a piece of wood.  She was given Prozac however she stopped taking it when she get better.  She had also taken Wellbutrin in the past with good response. Patient denies any history of sexual, verbal emotional abuse.    Anxiety: Yes Bipolar Disorder: Yes Depression: Yes Mania: Yes Psychosis: Yes Schizophrenia: No Personality Disorder: No Hospitalization for psychiatric illness: Yes History of Electroconvulsive Shock Therapy: No Prior Suicide Attempts: No   Review of Systems: Psychiatric: Agitation: No Hallucination: No Depressed Mood: No Insomnia: No Hypersomnia: No Altered Concentration: No Feels Worthless: No Grandiose Ideas: No Belief In Special Powers: No New/Increased Substance Abuse: No Compulsions: No  Neurologic: Headache: No Seizure: No Paresthesias: No    Outpatient Encounter Prescriptions as of 05/30/2014  Medication Sig  . ARIPiprazole (ABILIFY) 5 MG tablet Take 1 tablet (5 mg total) by mouth daily.  . Blood Glucose Monitoring Suppl (ACCU-CHEK AVIVA PLUS) W/DEVICE KIT 1 kit to test blood sugars daily dx 250.00  . glipiZIDE (GLUCOTROL XL) 10 MG 24 hr tablet Take 10 mg by mouth daily with breakfast.  . glipiZIDE (GLUCOTROL XL) 5 MG 24 hr tablet Take 1 tablet (5 mg total) by mouth daily with breakfast.  . metFORMIN (GLUCOPHAGE) 500 MG tablet Take 4 tablets (2,000 mg total) by mouth daily with breakfast.  . montelukast (SINGULAIR) 10 MG tablet Take 1 tablet (10 mg total) by mouth at bedtime.  Marland Kitchen omeprazole (PRILOSEC) 20 MG capsule Take 20 mg by mouth daily.  Marland Kitchen terconazole (TERAZOL 7) 0.4 % vaginal cream Place 1 applicator vaginally at bedtime. For 2 weeks  . triamterene-hydrochlorothiazide (DYAZIDE) 50-25 MG per capsule Take 1 capsule by mouth every morning.  . [DISCONTINUED] ARIPiprazole (ABILIFY) 5  MG tablet Take 5 mg by mouth daily.    Physical Exam: Constitutional:  BP 132/94  Pulse 103  Ht _0  (1.651 m)  Wt 233 lb 12.8 oz (106.051 kg)  BMI 38.91 kg/m2  LMP 05/18/2014  Recent Results (from the past 2160 hour(s))  CBC WITH DIFFERENTIAL     Status: Abnormal   Collection Time    03/05/14  9:57 AM      Result Value Ref Range   WBC 3.8 (*) 4.0 - 10.5 K/uL    RBC 4.11  3.87 - 5.11 MIL/uL   Hemoglobin 11.9 (*) 12.0 - 15.0 g/dL   HCT 35.1 (*) 36.0 - 46.0 %   MCV 85.4  78.0 - 100.0 fL   MCH 29.0  26.0 - 34.0 pg   MCHC 33.9  30.0 - 36.0 g/dL   RDW 15.0  11.5 - 15.5 %   Platelets 306  150 - 400 K/uL   Neutrophils Relative % 39 (*) 43 - 77 %   Neutro Abs 1.5 (*) 1.7 - 7.7 K/uL   Lymphocytes Relative 50 (*) 12 - 46 %   Lymphs Abs 1.9  0.7 - 4.0 K/uL   Monocytes Relative 7  3 - 12 %   Monocytes Absolute 0.3  0.1 - 1.0 K/uL   Eosinophils Relative 4  0 - 5 %   Eosinophils Absolute 0.2  0.0 - 0.7 K/uL   Basophils Relative 0  0 - 1 %   Basophils Absolute 0.0  0.0 - 0.1 K/uL   Smear Review Criteria for review not met    COMPREHENSIVE METABOLIC PANEL     Status: Abnormal   Collection Time    03/05/14  9:57 AM      Result Value Ref Range   Sodium 139  135 - 145 mEq/L   Potassium 3.9  3.5 - 5.3 mEq/L   Chloride 104  96 - 112 mEq/L   CO2 27  19 - 32 mEq/L   Glucose, Bld 119 (*) 70 - 99 mg/dL   BUN 4 (*) 6 - 23 mg/dL   Creat 0.75  0.50 - 1.10 mg/dL   Total Bilirubin 0.3  0.2 - 1.2 mg/dL   Alkaline Phosphatase 54  39 - 117 U/L   AST 14  0 - 37 U/L   ALT 16  0 - 35 U/L   Total Protein 6.4  6.0 - 8.3 g/dL   Albumin 4.0  3.5 - 5.2 g/dL   Calcium 8.8  8.4 - 10.5 mg/dL  STOOL CULTURE     Status: None   Collection Time    03/06/14 10:51 AM      Result Value Ref Range   Organism ID, Bacteria No Salmonella,Shigella,Campylobacter,Yersinia,or     Organism ID, Bacteria No E.coli [id number] isolated.    C. DIFFICILE GDH AND TOXIN A/B     Status: None   Collection Time    03/06/14 10:51 AM      Result Value Ref Range   C. difficile GDH Not Detected     C. difficile Toxin A/B Not Detected     Comment: No Toxigenic C. difficile Detected   Source STOOL    OVA AND PARASITE EXAMINATION     Status: None   Collection Time    03/06/14 10:51 AM      Result Value Ref Range   OP No Ova or Parasites Seen      OP Moderate     OP Yeast  HEMOGLOBIN A1C      Status: Abnormal   Collection Time    04/17/14  9:28 AM      Result Value Ref Range   Hemoglobin A1C 7.7 (*) <5.7 %   Comment:                                                                            According to the ADA Clinical Practice Recommendations for 2011, when     HbA1c is used as a screening test:             >=6.5%   Diagnostic of Diabetes Mellitus                (if abnormal result is confirmed)           5.7-6.4%   Increased risk of developing Diabetes Mellitus           References:Diagnosis and Classification of Diabetes Mellitus,Diabetes     QQVZ,5638,75(IEPPI 1):S62-S69 and Standards of Medical Care in             Diabetes - 2011,Diabetes Care,2011,34 (Suppl 1):S11-S61.         Mean Plasma Glucose 174 (*) <117 mg/dL  COMPLETE METABOLIC PANEL WITH GFR     Status: Abnormal   Collection Time    04/17/14  9:28 AM      Result Value Ref Range   Sodium 137  135 - 145 mEq/L   Potassium 4.2  3.5 - 5.3 mEq/L   Chloride 104  96 - 112 mEq/L   CO2 26  19 - 32 mEq/L   Glucose, Bld 160 (*) 70 - 99 mg/dL   BUN 12  6 - 23 mg/dL   Creat 0.93  0.50 - 1.10 mg/dL   Total Bilirubin 0.6  0.2 - 1.2 mg/dL   Alkaline Phosphatase 58  39 - 117 U/L   AST 24  0 - 37 U/L   ALT 23  0 - 35 U/L   Total Protein 7.1  6.0 - 8.3 g/dL   Albumin 4.4  3.5 - 5.2 g/dL   Calcium 9.3  8.4 - 10.5 mg/dL   GFR, Est African American >89     GFR, Est Non African American 78     Comment:       The estimated GFR is a calculation valid for adults (>=23 years old)     that uses the CKD-EPI algorithm to adjust for age and sex. It is       not to be used for children, pregnant women, hospitalized patients,        patients on dialysis, or with rapidly changing kidney function.     According to the NKDEP, eGFR >89 is normal, 60-89 shows mild     impairment, 30-59 shows moderate impairment, 15-29 shows severe     impairment and <15 is ESRD.        URINALYSIS, ROUTINE W REFLEX MICROSCOPIC     Status: Abnormal    Collection Time    05/21/14  2:01 PM      Result Value Ref Range   Color, Urine YELLOW  YELLOW   APPearance CLEAR  CLEAR   Specific Gravity, Urine 1.028  1.005 - 1.030   pH 6.0  5.0 - 8.0   Glucose, UA NEG  NEG mg/dL   Bilirubin Urine NEG  NEG   Ketones, ur NEG  NEG mg/dL   Hgb urine dipstick SMALL (*) NEG   Protein, ur 30 (*) NEG mg/dL   Urobilinogen, UA 0.2  0.0 - 1.0 mg/dL   Nitrite NEG  NEG   Leukocytes, UA NEG  NEG  URINALYSIS, MICROSCOPIC ONLY     Status: None   Collection Time    05/21/14  2:01 PM      Result Value Ref Range   Squamous Epithelial / LPF RARE  RARE   Crystals Calcium Oxalate crystals noted  NONE SEEN   Casts NONE SEEN  NONE SEEN   WBC, UA 0-2  <3 WBC/hpf   RBC / HPF 0-2  <3 RBC/hpf   Bacteria, UA NONE SEEN  RARE  URINE CULTURE     Status: None   Collection Time    05/21/14  2:01 PM      Result Value Ref Range   Colony Count 15,000 COLONIES/ML     Organism ID, Bacteria Multiple bacterial morphotypes present, none     Organism ID, Bacteria predominant. Suggest appropriate recollection if      Organism ID, Bacteria clinically indicated.      Musculoskeletal: Strength & Muscle Tone: within normal limits Gait & Station: normal Patient leans: Front  Mental Status Examination;  Patient is a middle-aged female who is casually dressed and fairly groomed.  She described her mood euthymic and her affect is mood appropriate.  Her speech is fast but clear and coherent.  Her thought processes is logical and goal directed.  She denies any active or passive suicidal thoughts or homicidal thoughts.  There were no paranoia or delusions present at this time.  Her fund of knowledge is adequate.  She is alert and oriented x3.  Her attention and concentration is fair.  Her insight judgment and impulse control is okay.   Established Problem, Stable/Improving (1), Review of Psycho-Social Stressors (1), Review or order clinical lab tests (1), Review of Last Therapy Session  (1) and Review of Medication Regimen & Side Effects (2)  Assessment: Axis I: Bipolar disorder NOS  Axis II: Deferred  Axis III:  Past Medical History  Diagnosis Date  . GERD (gastroesophageal reflux disease)   . Bipolar disorder   . Gastroparesis   . 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 Hypertension     chronic on meds  . Diabetes mellitus     type 2 DM, insulin only needed during pregnancy  . Chronic kidney disease     proteinuria    Axis IV: Mild   Plan:  Patient is doing better on Abilify 5 mg.  she does not have any side effects.  Discussed weight gain and increase hemoglobin A1c.  She promised that she will watch her calorie intake and regular exercise.  I discussed risks and benefits of medication.  Recommended to cause back if she has any question or any concern.  Followup in 3 months.  ARFEEN,SYED T., MD 05/30/2014

## 2014-06-04 ENCOUNTER — Encounter (HOSPITAL_COMMUNITY): Payer: Self-pay | Admitting: Psychiatry

## 2014-06-04 ENCOUNTER — Ambulatory Visit (HOSPITAL_COMMUNITY): Payer: Self-pay | Admitting: Psychiatry

## 2014-06-12 ENCOUNTER — Telehealth: Payer: Self-pay | Admitting: *Deleted

## 2014-06-12 NOTE — Telephone Encounter (Signed)
Patient interested in the Nexplanon for contraception. Patient states she is currently sexually active with the last encounter 10-23-5. Patient states her LMP is 05-23-14. Patient will be out of town next week. Patient scheduled for 06-14-14 @ 2:45p.

## 2014-06-14 ENCOUNTER — Ambulatory Visit (INDEPENDENT_AMBULATORY_CARE_PROVIDER_SITE_OTHER): Payer: Medicaid Other | Admitting: Obstetrics & Gynecology

## 2014-06-14 VITALS — BP 125/87 | HR 102 | Wt 233.0 lb

## 2014-06-14 DIAGNOSIS — Z3202 Encounter for pregnancy test, result negative: Secondary | ICD-10-CM

## 2014-06-14 DIAGNOSIS — Z3049 Encounter for surveillance of other contraceptives: Secondary | ICD-10-CM

## 2014-06-14 DIAGNOSIS — Z01818 Encounter for other preprocedural examination: Secondary | ICD-10-CM

## 2014-06-14 DIAGNOSIS — Z30019 Encounter for initial prescription of contraceptives, unspecified: Secondary | ICD-10-CM

## 2014-06-14 LAB — POCT URINE PREGNANCY: Preg Test, Ur: NEGATIVE

## 2014-06-14 MED ORDER — ETONOGESTREL 68 MG ~~LOC~~ IMPL
68.0000 mg | DRUG_IMPLANT | Freq: Once | SUBCUTANEOUS | Status: AC
  Administered 2014-06-14: 68 mg via SUBCUTANEOUS

## 2014-06-20 NOTE — Progress Notes (Signed)
NEXPLANON INSERTION NOTE   BP 125/87 mmHg  Pulse 102  Wt 105.688 kg (233 lb)  LMP 05/18/2014   Pregnancy test result:  Lab Results  Component Value Date   PREGTESTUR Negative 06/14/2014    Indications:  The patient desires contraception.  She understands risks, benefits, and alternatives to Implanon and would like to proceed.  Anesthesia:   Lidocaine 1% plain.  Procedure:  A time-out was performed confirming the procedure and the patient's allergy status.  The patient's non-dominant was identified as the left arm.  The protection cap was removed. While placing countertraction on the skin, the needle was inserted at a 30 degree angle.  The applicator was held horizontal to the skin; the skin was tented upward as the needle was introduced into the subdermal space.  While holding the applicator in place, the slider was unlocked. The Nexplanon was removed from the field.  The Nexplanon was palpated to ensure proper placement.  Complications: None  Instructions:  The patient was instructed to remove the dressing in 24 hours and that some bruising is to be expected.  She was advised to use over the counter analgesics as needed for any pain at the site.  She is to keep the area dry for 24 hours and to call if her hand or arm becomes cold, numb, or blue.  Return visit:  Return as needed

## 2014-06-25 ENCOUNTER — Encounter: Payer: Self-pay | Admitting: Family Medicine

## 2014-06-25 ENCOUNTER — Ambulatory Visit (INDEPENDENT_AMBULATORY_CARE_PROVIDER_SITE_OTHER): Payer: Medicaid Other | Admitting: Family Medicine

## 2014-06-25 VITALS — BP 120/84 | HR 77 | Temp 99.4°F | Resp 16 | Ht 65.0 in | Wt 232.0 lb

## 2014-06-25 DIAGNOSIS — I10 Essential (primary) hypertension: Secondary | ICD-10-CM

## 2014-06-25 DIAGNOSIS — J209 Acute bronchitis, unspecified: Secondary | ICD-10-CM

## 2014-06-25 DIAGNOSIS — J0100 Acute maxillary sinusitis, unspecified: Secondary | ICD-10-CM | POA: Insufficient documentation

## 2014-06-25 DIAGNOSIS — E1165 Type 2 diabetes mellitus with hyperglycemia: Secondary | ICD-10-CM

## 2014-06-25 DIAGNOSIS — R809 Proteinuria, unspecified: Secondary | ICD-10-CM

## 2014-06-25 DIAGNOSIS — IMO0001 Reserved for inherently not codable concepts without codable children: Secondary | ICD-10-CM

## 2014-06-25 DIAGNOSIS — B373 Candidiasis of vulva and vagina: Secondary | ICD-10-CM

## 2014-06-25 DIAGNOSIS — B3731 Acute candidiasis of vulva and vagina: Secondary | ICD-10-CM

## 2014-06-25 MED ORDER — FLUCONAZOLE 150 MG PO TABS: ORAL_TABLET | ORAL | Status: DC

## 2014-06-25 MED ORDER — BENZONATATE 100 MG PO CAPS
100.0000 mg | ORAL_CAPSULE | Freq: Two times a day (BID) | ORAL | Status: DC | PRN
Start: 2014-06-25 — End: 2014-08-07

## 2014-06-25 MED ORDER — LEVOFLOXACIN 750 MG PO TABS: 750.0000 mg | ORAL_TABLET | Freq: Every day | ORAL | Status: DC

## 2014-06-25 MED ORDER — DAPAGLIFLOZIN PROPANEDIOL 5 MG PO TABS: 5.0000 mg | ORAL_TABLET | Freq: Every day | ORAL | Status: DC

## 2014-06-25 NOTE — Assessment & Plan Note (Signed)
Uncontrolled and reports FBG of 140's  Add farxiga daily in December when available  Patient advised to reduce carb and sweets, commit to regular physical activity, take meds as prescribed, test blood as directed, and attempt to lose weight, to improve blood sugar control. Updated lab needed at/ before next visit.

## 2014-06-25 NOTE — Assessment & Plan Note (Signed)
Deteriorated. Patient re-educated about  the importance of commitment to a  minimum of 150 minutes of exercise per week. The importance of healthy food choices with portion control discussed. Encouraged to start a food diary, count calories and to consider  joining a support group. Sample diet sheets offered. Goals set by the patient for the next several months.    

## 2014-06-25 NOTE — Assessment & Plan Note (Signed)
levaquin and tessalon perles prescribed

## 2014-06-25 NOTE — Assessment & Plan Note (Signed)
levaquin x 5 days prescribed, fluconazole if needed for vaginal yeast

## 2014-06-25 NOTE — Patient Instructions (Addendum)
F/u in 4 month, call if you need me before  You are treated for acute left maxillary sinusitis and bronchitis. Take entire course of meds  YOu are referred to nutritionist at [facility] in addition to current meds to start in Dec for your diabetes is farxiga 5mg  one daily  Goal for fasting blood sugar ranges from 80 to 120 and 2 hours after any meal or at bedtime should be between 130 to 170.  It is important that you exercise regularly at least 30 minutes 5 times a week. If you develop chest pain, have severe difficulty breathing, or feel very tired, stop exercising immediately and seek medical attention   A healthy diet is rich in fruit, vegetables and whole grains. Poultry fish, nuts and beans are a healthy choice for protein rather then red meat. A low sodium diet and drinking 64 ounces of water daily is generally recommended. Oils and sweet should be limited. Carbohydrates especially for those who are diabetic or overweight, should be limited to 60 to -45 gram per meal. It is important to eat on a regular schedule, at least 3 times daily. Snacks should be primarily fruits, vegetables or nuts.  Hand washing and covering coughs, go a LONG way to preventing spread of communicable illness  Work excuse for today and tomorrow  Tylenol is what you use for pain or fever, does not bother your kidneys

## 2014-06-25 NOTE — Assessment & Plan Note (Signed)
Controlled, no change in medication DASH diet and commitment to daily physical activity for a minimum of 30 minutes discussed and encouraged, as a part of hypertension management. The importance of attaining a healthy weight is also discussed.  

## 2014-06-25 NOTE — Progress Notes (Signed)
   Subjective:    Patient ID: Christina [name], female    DOB: March 24, 1976, 38 y.o.   MRN: [id number]  HPI 3 day  h/o worsening head and chest congestion, associated with fever and chills intermittently. Nasal drainage has thickened , and is yellowish green, and at times bloody. Sputum is thick and yellow. C/o bilateral ear pressure,  and sore throat. Increasing fatigue , poor appetitie and sleep disturbed by cough. No improvement with OTC medication. Daughter has respiratory symptoms Had anaphylactic shock reaction to peanuts eaten at a conference last week Wednesday,was treated in the eD at [facility] See HPI . Denies chest pains, palpitations and leg swelling Denies abdominal pain, nausea, vomiting,diarrhea or constipation.   Denies dysuria, frequency, hesitancy or incontinence. Denies joint pain, swelling and limitation in mobility. Denies headaches, seizures, numbness, or tingling. Denies depression, anxiety or insomnia. Denies skin break down or rash.        Objective:   Physical Exam  BP 120/84 mmHg  Pulse 77  Temp(Src) 99.4 F (37.4 C) (Oral)  Resp 16  Ht 5\' 5"  (1.651 m)  Wt 232 lb (105.235 kg)  BMI 38.61 kg/m2  SpO2 98% Patient alert and oriented and in no cardiopulmonary distress.  HEENT: No facial asymmetry, EOMI,   oropharynx pink and moist.  Neck supple no JVD, no mass. Left maxillary sinus tender, TM clear with good light reflex, no erythema or exudate in orpophjarynx Chest: adequate though reduced air entry bilaterally, few scattered crackles no wheezes  CVS: S1, S2 no murmurs, no S3.Regular rate.  ABD: Soft non tender.   Ext: No edema  MS: Adequate ROM spine, shoulders, hips and knees.  Skin: Intact, no ulcerations or rash noted.  Psych: Good eye contact, normal affect. Memory intact not anxious or depressed appearing.  CNS: CN 2-12 intact, power,  normal throughout.no focal deficits noted.       Assessment & Plan:  Acute  bronchitis levaquin and tessalon perles prescribed  Maxillary sinusitis, acute levaquin x 5 days prescribed, fluconazole if needed for vaginal yeast  Hypertension goal BP (blood pressure) < 130/80 Controlled, no change in medication DASH diet and commitment to daily physical activity for a minimum of 30 minutes discussed and encouraged, as a part of hypertension management. The importance of attaining a healthy weight is also discussed.   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 Updated lab needed at/ before next visit.   Obesity, Class II, BMI 35-39.9, with comorbidity Deteriorated. Patient re-educated about  the importance of commitment to a  minimum of 150 minutes of exercise per week. The importance of healthy food choices with portion control discussed. Encouraged to start a food diary, count calories and to consider  joining a support group. Sample diet sheets offered. Goals set by the patient for the next several months.

## 2014-07-06 ENCOUNTER — Telehealth: Payer: Self-pay

## 2014-07-06 NOTE — Telephone Encounter (Signed)
Verbal order to increase Farxiga to 10mg .   Patient aware and will take 2 of 5mg  until she hears back from the office.   Followup call to be made to patient on week of 11/7

## 2014-07-07 ENCOUNTER — Other Ambulatory Visit: Payer: Self-pay | Admitting: Family Medicine

## 2014-07-11 ENCOUNTER — Other Ambulatory Visit: Payer: Self-pay

## 2014-07-11 MED ORDER — GLIPIZIDE ER 10 MG PO TB24: ORAL_TABLET | ORAL | Status: DC

## 2014-07-30 ENCOUNTER — Encounter: Payer: Self-pay | Admitting: *Deleted

## 2014-07-31 ENCOUNTER — Encounter: Payer: Self-pay | Admitting: Obstetrics & Gynecology

## 2014-08-07 ENCOUNTER — Ambulatory Visit (INDEPENDENT_AMBULATORY_CARE_PROVIDER_SITE_OTHER): Payer: BLUE CROSS/BLUE SHIELD | Admitting: Family Medicine

## 2014-08-07 ENCOUNTER — Encounter: Payer: Self-pay | Admitting: Family Medicine

## 2014-08-07 VITALS — BP 118/82 | HR 85 | Resp 16 | Ht 65.0 in | Wt 231.0 lb

## 2014-08-07 DIAGNOSIS — B3731 Acute candidiasis of vulva and vagina: Secondary | ICD-10-CM

## 2014-08-07 DIAGNOSIS — IMO0001 Reserved for inherently not codable concepts without codable children: Secondary | ICD-10-CM

## 2014-08-07 DIAGNOSIS — I10 Essential (primary) hypertension: Secondary | ICD-10-CM

## 2014-08-07 DIAGNOSIS — E1165 Type 2 diabetes mellitus with hyperglycemia: Secondary | ICD-10-CM

## 2014-08-07 DIAGNOSIS — B373 Candidiasis of vulva and vagina: Secondary | ICD-10-CM

## 2014-08-07 DIAGNOSIS — F3131 Bipolar disorder, current episode depressed, mild: Secondary | ICD-10-CM

## 2014-08-07 DIAGNOSIS — R809 Proteinuria, unspecified: Secondary | ICD-10-CM

## 2014-08-07 LAB — BASIC METABOLIC PANEL WITH GFR
BUN: 8 mg/dL (ref 6–23)
CO2: 27 mEq/L (ref 19–32)
Calcium: 9.9 mg/dL (ref 8.4–10.5)
Chloride: 102 mEq/L (ref 96–112)
Creat: 0.85 mg/dL (ref 0.50–1.10)
GFR, Est African American: >89 mL/min
GFR, Est Non African American: 87 mL/min
Glucose, Bld: 139 mg/dL — ABNORMAL HIGH (ref 70–99)
Potassium: 3.7 mEq/L (ref 3.5–5.3)
Sodium: 138 mEq/L (ref 135–145)

## 2014-08-07 LAB — HEMOGLOBIN A1C
Hgb A1c MFr Bld: 8.5 % — ABNORMAL HIGH (ref <5.7)
Mean Plasma Glucose: 197 mg/dL — ABNORMAL HIGH (ref <117)

## 2014-08-07 LAB — GLUCOSE, POCT (MANUAL RESULT ENTRY): POC Glucose: 237 mg/dl — AB (ref 70–99)

## 2014-08-07 MED ORDER — METFORMIN HCL 1000 MG PO TABS: 1000.0000 mg | ORAL_TABLET | Freq: Two times a day (BID) | ORAL | Status: DC

## 2014-08-07 MED ORDER — DAPAGLIFLOZIN PROPANEDIOL 10 MG PO TABS: 10.0000 mg | ORAL_TABLET | Freq: Every day | ORAL | Status: DC

## 2014-08-07 MED ORDER — FLUCONAZOLE 150 MG PO TABS: ORAL_TABLET | ORAL | Status: DC

## 2014-08-07 NOTE — Patient Instructions (Addendum)
F/.u in 3 month, call if you need me before  You are referred for diabetic education  Increase in [place] do[place] to 10 mg daily  PLEASE STOP COFFEE Sweetened, unsweetened or calorie free sweetener is fine  Test once daily, and record   Fasting sugar range  80 to 120  2 hr after any meal or bedtime range is 130 to 180  Write down blood sugar values and  send message to nurse every Friday  Start 20 mins to 30  mins daily of exercise  Call re bariatric surgery  HBA1C, chem 7 and eGFR in 3 monht   Fluconazole is refilled

## 2014-08-07 NOTE — Progress Notes (Signed)
Subjective:    Patient ID: Christina [name], female    DOB: [birth year], 39 y.o.   MRN: [id number]  HPI The PT is here for follow up and re-evaluation of chronic medical conditions, medication management and review of any available recent lab and radiology data.  Preventive health is updated, specifically  Cancer screening and Immunization.   Questions or concerns regarding consultations or procedures which the PT has had in the interim are  Addressed.Has contraceptive placed and should therefore be started on ACE will discuss at next visit The PT denies any adverse reactions to current medications since the last visit.  Overwhelmed with home responsibilities, overindulging in sweet coffee drinks, not testing and when she does blood sugar is seldom under 200 No regular exercise       Review of Systems See HPI Denies recent fever or chills. Denies sinus pressure, nasal congestion, ear pain or sore throat. Denies chest congestion, productive cough or wheezing. Denies chest pains, palpitations and leg swelling Denies abdominal pain, nausea, vomiting,diarrhea or constipation.   Denies dysuria, frequency, hesitancy or incontinence. Denies joint pain, swelling and limitation in mobility. Denies headaches, seizures, numbness, or tingling. Denies uncontrolled  depression, anxiety or insomnia. Denies skin break down or rash.        Objective:   Physical Exam  BP 118/82 mmHg  Pulse 85  Resp 16  Ht 5\' 5"  (1.651 m)  Wt 231 lb (104.781 kg)  BMI 38.44 kg/m2  SpO2 96%  Patient alert and oriented and in no cardiopulmonary distress.  HEENT: No facial asymmetry, EOMI,   oropharynx pink and moist.  Neck supple no JVD, no mass.  Chest: Clear to auscultation bilaterally.  CVS: S1, S2 no murmurs, no S3.Regular rate.  ABD: Soft non tender.   Ext: No edema  MS: Adequate ROM spine, shoulders, hips and knees.  Skin: Intact, no ulcerations or rash noted.  Psych: Good eye contact, normal  affect. Memory intact not anxious or depressed appearing.  CNS: CN 2-12 intact, power,  normal throughout.no focal deficits noted.       Assessment & Plan:  Hypertension goal BP (blood pressure) < 130/80 Controlled, no change in medication DASH diet and commitment to daily physical activity for a minimum of 30 minutes discussed and encouraged, as a part of hypertension management. The importance of attaining a healthy weight is also discussed.   Obesity, Class II, BMI 35-39.9, with comorbidity Deteriorated. Patient re-educated about  the importance of commitment to a  minimum of 150 minutes of exercise per week. The importance of healthy food choices with portion control discussed. Encouraged to start a food diary, count calories and to consider  joining a support group. Sample diet sheets offered. Goals set by the patient for the next several months. 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 The fact that changes in all these areas will reduce or eliminate all together the development of diabetes is stressed.   Need  To start  ACE will discuss at next visit  Bipolar disorder Stable and treated by psychiatry  Candidiasis of vulva and vagina Increased episodes due to high glucosuria , pt understands and needs to reduce intake of sugar Fluconazole script sent , advised to use no more frequent than once weekly

## 2014-08-10 ENCOUNTER — Encounter: Payer: Self-pay | Admitting: Family Medicine

## 2014-08-12 ENCOUNTER — Telehealth: Payer: Self-pay | Admitting: Family Medicine

## 2014-08-12 NOTE — Assessment & Plan Note (Signed)
Controlled, no change in medication DASH diet and commitment to daily physical activity for a minimum of 30 minutes discussed and encouraged, as a part of hypertension management. The importance of attaining a healthy weight is also discussed.  

## 2014-08-12 NOTE — Assessment & Plan Note (Signed)
Deteriorated due to non conpliance Re educated at visit by myself and nursing staff also referred to community diabetic educator , she is to [employer] and send in weekly blood sugar values Patient educated about the importance of limiting  Carbohydrate intake , the need to commit to daily physical activity for a minimum of 30 minutes , and to commit weight loss. The fact that changes in all these areas will reduce or eliminate all together the development of diabetes is stressed.   Need  To start ACE will discuss at next visit

## 2014-08-12 NOTE — Telephone Encounter (Signed)
I see where you saw pt's fasting sugars, her goal is 80 to 120, she is young, I suggest  Glipizide to 20 mg daily from 15 mg daily if her fasting numbers remain higher than they should be at the endo of this upcoming week, pls send her a msg to make her aware of this plan, and pls also ensure that she has referal d sent to community diabetic ed class, thanks Let her also know it is good she is following through on weekly reports until within normal, she NEEDS to do this Also next labs order need to have microalb added pls let her know and fax in, it will be due (already is)

## 2014-08-12 NOTE — Assessment & Plan Note (Signed)
Stable and treated by psychiatry 

## 2014-08-12 NOTE — Assessment & Plan Note (Signed)
Increased episodes due to high glucosuria , pt understands and needs to reduce intake of sugar Fluconazole script sent , advised to use no more frequent than once weekly

## 2014-08-12 NOTE — Assessment & Plan Note (Signed)
Deteriorated. Patient re-educated about  the importance of commitment to a  minimum of 150 minutes of exercise per week. The importance of healthy food choices with portion control discussed. Encouraged to start a food diary, count calories and to consider  joining a support group. Sample diet sheets offered. Goals set by the patient for the next several months. Pt encouraged to explore bariatric surgery , states she is interested but has not pursued due to lack of ins coverage for procedure

## 2014-08-13 ENCOUNTER — Encounter: Payer: Self-pay | Admitting: Family Medicine

## 2014-08-14 NOTE — Telephone Encounter (Signed)
Patient is aware of advise and will continue to report weekly CBGs via [facility]

## 2014-08-14 NOTE — Telephone Encounter (Signed)
Called and left message for patient to return call.  

## 2014-08-17 ENCOUNTER — Encounter: Payer: Self-pay | Admitting: Family Medicine

## 2014-08-17 ENCOUNTER — Other Ambulatory Visit: Payer: Self-pay

## 2014-08-17 MED ORDER — TRIAMTERENE-HCTZ 50-25 MG PO CAPS: 1.0000 | ORAL_CAPSULE | ORAL | Status: DC

## 2014-08-22 ENCOUNTER — Ambulatory Visit: Payer: Medicaid Other | Admitting: Obstetrics & Gynecology

## 2014-08-24 ENCOUNTER — Encounter: Payer: Self-pay | Admitting: Family Medicine

## 2014-08-30 ENCOUNTER — Ambulatory Visit (HOSPITAL_COMMUNITY): Payer: Self-pay | Admitting: Psychiatry

## 2014-09-03 ENCOUNTER — Encounter: Payer: Self-pay | Admitting: Family Medicine

## 2014-09-03 ENCOUNTER — Ambulatory Visit (INDEPENDENT_AMBULATORY_CARE_PROVIDER_SITE_OTHER): Payer: BLUE CROSS/BLUE SHIELD | Admitting: Family Medicine

## 2014-09-03 VITALS — BP 126/80 | HR 97 | Resp 18 | Ht 65.0 in | Wt 231.1 lb

## 2014-09-03 DIAGNOSIS — E1165 Type 2 diabetes mellitus with hyperglycemia: Secondary | ICD-10-CM

## 2014-09-03 DIAGNOSIS — J321 Chronic frontal sinusitis: Secondary | ICD-10-CM | POA: Insufficient documentation

## 2014-09-03 DIAGNOSIS — R809 Proteinuria, unspecified: Secondary | ICD-10-CM

## 2014-09-03 DIAGNOSIS — IMO0001 Reserved for inherently not codable concepts without codable children: Secondary | ICD-10-CM

## 2014-09-03 DIAGNOSIS — I10 Essential (primary) hypertension: Secondary | ICD-10-CM

## 2014-09-03 DIAGNOSIS — J0110 Acute frontal sinusitis, unspecified: Secondary | ICD-10-CM

## 2014-09-03 MED ORDER — LEVOFLOXACIN 500 MG PO TABS: 500.0000 mg | ORAL_TABLET | Freq: Every day | ORAL | Status: DC

## 2014-09-03 MED ORDER — FLUCONAZOLE 150 MG PO TABS: ORAL_TABLET | ORAL | Status: DC

## 2014-09-03 NOTE — Patient Instructions (Signed)
F/u as before   You are treated for acute frontal sinusitis , take  The entire 7 day course of antibiotic  Foot exam today is normal

## 2014-09-04 NOTE — Assessment & Plan Note (Signed)
Controlled, no change in medication DASH diet and commitment to daily physical activity for a minimum of 30 minutes discussed and encouraged, as a part of hypertension management. The importance of attaining a healthy weight is also discussed.  

## 2014-09-04 NOTE — Progress Notes (Signed)
   Subjective:    Patient ID: Christina [name], female    DOB: Dec 12, 1975, 39 y.o.   MRN: [id number]  HPI 5 day h/o worsening sinus pressure with yellow green nasal drainage, sick contact at home Denies sore throat , ear pain or productive cough Denies polyuria, polydipsia, blurred vision , or hypoglycemic episodes. No other complaints   Review of Systems See HPI Denies chest congestion, productive cough or wheezing. Denies chest pains, palpitations and leg swelling Denies abdominal pain, nausea, vomiting,diarrhea or constipation.   Denies dysuria, frequency, hesitancy or incontinence. Denies joint pain, swelling and limitation in mobility. Denies headaches, seizures, numbness, or tingling. Denies uncontrolled  depression, anxiety or insomnia. Denies skin break down or rash.        Objective:   Physical Exam BP 126/80 mmHg  Pulse 97  Resp 18  Ht 5\' 5"  (1.651 m)  Wt 231 lb 1.9 oz (104.835 kg)  BMI 38.46 kg/m2  SpO2 98% Patient alert and oriented and in no cardiopulmonary distress.  HEENT: No facial asymmetry, EOMI,   oropharynx pink and moist.  Neck supple no JVD, anterior cervical adenitis, frontal tenderness, TM clear bilaterally  Chest: Clear to auscultation bilaterally.  CVS: S1, S2 no murmurs, no S3.Regular rate.  ABD: Soft non tender.   Ext: No edema  Mh noted.  Psych: Good eye contact, normal affect. Memory intact not anxious or depressed appearing.  CNS: CN 2-12 intact, power,  normal throughout.no focal deficits noted.        Assessment & Plan:  Acute frontal sinusitis Antibiotic prescribed   Hypertension goal BP (blood pressure) < 130/80 Controlled, no change in medication DASH diet and commitment to daily physical activity for a minimum of 30 minutes discussed and encouraged, as a part of hypertension management. The importance of attaining a healthy weight is also discussed.

## 2014-09-04 NOTE — Assessment & Plan Note (Signed)
Antibiotic prescribed 

## 2014-09-10 ENCOUNTER — Ambulatory Visit: Payer: Medicaid Other | Admitting: Obstetrics

## 2014-09-11 ENCOUNTER — Ambulatory Visit (HOSPITAL_COMMUNITY): Payer: Self-pay | Admitting: Psychiatry

## 2014-09-17 ENCOUNTER — Encounter: Payer: BLUE CROSS/BLUE SHIELD | Admitting: Nutrition

## 2014-09-18 ENCOUNTER — Ambulatory Visit (HOSPITAL_COMMUNITY): Payer: Self-pay | Admitting: Psychiatry

## 2014-09-19 ENCOUNTER — Ambulatory Visit (INDEPENDENT_AMBULATORY_CARE_PROVIDER_SITE_OTHER): Payer: BLUE CROSS/BLUE SHIELD | Admitting: Family Medicine

## 2014-09-19 ENCOUNTER — Ambulatory Visit (HOSPITAL_COMMUNITY)
Admission: RE | Admit: 2014-09-19 | Discharge: 2014-09-19 | Disposition: A | Discharge status: Home / Self Care | Payer: BLUE CROSS/BLUE SHIELD | Source: Ambulatory Visit | Attending: Family Medicine | Admitting: Family Medicine

## 2014-09-19 ENCOUNTER — Encounter: Payer: Self-pay | Admitting: Family Medicine

## 2014-09-19 VITALS — BP 122/82 | HR 91 | Temp 98.7°F | Resp 16 | Ht 65.0 in | Wt 231.0 lb

## 2014-09-19 DIAGNOSIS — J0141 Acute recurrent pansinusitis: Secondary | ICD-10-CM | POA: Diagnosis present

## 2014-09-19 DIAGNOSIS — J324 Chronic pansinusitis: Secondary | ICD-10-CM | POA: Insufficient documentation

## 2014-09-19 DIAGNOSIS — R51 Headache: Secondary | ICD-10-CM | POA: Insufficient documentation

## 2014-09-19 DIAGNOSIS — E1165 Type 2 diabetes mellitus with hyperglycemia: Secondary | ICD-10-CM

## 2014-09-19 DIAGNOSIS — I10 Essential (primary) hypertension: Secondary | ICD-10-CM

## 2014-09-19 DIAGNOSIS — IMO0001 Reserved for inherently not codable concepts without codable children: Secondary | ICD-10-CM

## 2014-09-19 DIAGNOSIS — R809 Proteinuria, unspecified: Secondary | ICD-10-CM

## 2014-09-19 LAB — CBC WITH DIFFERENTIAL/PLATELET
Basophils Absolute: 0.1 10*3/uL (ref 0.0–0.1)
Basophils Relative: 1 % (ref 0–1)
Eosinophils Absolute: 0.1 10*3/uL (ref 0.0–0.7)
Eosinophils Relative: 2 % (ref 0–5)
HCT: 36.6 % (ref 36.0–46.0)
Hemoglobin: 11.8 g/dL — ABNORMAL LOW (ref 12.0–15.0)
Lymphocytes Relative: 33 % (ref 12–46)
Lymphs Abs: 2.1 10*3/uL (ref 0.7–4.0)
MCH: 27.9 pg (ref 26.0–34.0)
MCHC: 32.2 g/dL (ref 30.0–36.0)
MCV: 86.5 fL (ref 78.0–100.0)
MPV: 8.4 fL — ABNORMAL LOW (ref 8.6–12.4)
Monocytes Absolute: 0.5 10*3/uL (ref 0.1–1.0)
Monocytes Relative: 8 % (ref 3–12)
Neutro Abs: 3.6 10*3/uL (ref 1.7–7.7)
Neutrophils Relative %: 56 % (ref 43–77)
Platelets: 362 10*3/uL (ref 150–400)
RBC: 4.23 MIL/uL (ref 3.87–5.11)
RDW: 14.4 % (ref 11.5–15.5)
WBC: 6.4 10*3/uL (ref 4.0–10.5)

## 2014-09-19 NOTE — Progress Notes (Signed)
   Subjective:    Patient ID: Christina [name], female    DOB: [birth year], 39 y.o.   MRN: [id number]  HPI 5 day  h/o worsening head and chest congestion, associated with fever and chills intermittently. Nasal drainage has thickened , and is yellowish green, and at times bloody. Sputum is thick and yellow. C/o bilateral ear pressure, denies hearing loss and sore throat. Increasing fatigue , poor appetitie and sleep disturbed by cough. No improvement with OTC medication. Recently completed levaquin course , got bette but recurrent exposure to illness in the home    Review of Systems See HPI  Denies abdominal pain, nausea, vomiting,diarrhea or constipation.   Denies dysuria, frequency, hesitancy or incontinence. Denies joint pain, swelling and limitation in mobility. Denies headaches, seizures, numbness, or tingling. Denies depression, anxiety or insomnia. Denies skin break down or rash.        Objective:   Physical Exam BP 122/82 mmHg  Pulse 91  Temp(Src) 98.7 F (37.1 C) (Oral)  Resp 16  Ht 5\' 5"  (1.651 m)  Wt 231 lb (104.781 kg)  BMI 38.44 kg/m2  SpO2 97% Patient alert and oriented and in no cardiopulmonary distress.  HEENT: No facial asymmetry, EOMI,   oropharynx pink and moist.  Neck supple no JVD, no mass. No  Frontal or maxillary tenderness, TM clear, no cervical adenopathy Chest: Clear to auscultation bilaterally.  CVS: S1, S2 no murmurs, no S3.Regular rate.  ABD: Soft non tender.   Ext: No edema  MS: Adequate ROM spine, shoulders, hips and knees.  Skin: Intact, no ulcerations or rash noted.  Psych: Good eye contact, normal affect. Memory intact not anxious or depressed appearing.  CNS: CN 2-12 intact, power,  normal throughout.no focal deficits noted.        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

## 2014-09-19 NOTE — Patient Instructions (Addendum)
F/u as before  You need lab and sinus x rays today.  Fluids and rest, keep an eye on temp if spikes over 101 call  Work excuse from today to return on Friday

## 2014-09-20 MED ORDER — DOXYCYCLINE HYCLATE 100 MG PO TABS: 100.0000 mg | ORAL_TABLET | Freq: Two times a day (BID) | ORAL | Status: DC

## 2014-09-21 LAB — MICROALBUMIN / CREATININE URINE RATIO
Creatinine, Urine: 204 mg/dL
Microalb Creat Ratio: 40.7 mg/g — ABNORMAL HIGH (ref 0.0–30.0)
Microalb, Ur: 8.3 mg/dL — ABNORMAL HIGH (ref <2.0)

## 2014-09-22 LAB — RESPIRATORY CULTURE OR RESPIRATORY AND SPUTUM CULTURE: Organism ID, Bacteria: NORMAL

## 2014-09-23 ENCOUNTER — Encounter: Payer: Self-pay | Admitting: Family Medicine

## 2014-09-23 NOTE — Assessment & Plan Note (Signed)
Reports improved blood sugar values with recent med change , however due to infection still higher than goal

## 2014-09-23 NOTE — Assessment & Plan Note (Signed)
Controlled, no change in medication  

## 2014-09-23 NOTE — Assessment & Plan Note (Signed)
Recurrent symptoms following recent treatment, will await lab data prior to re introducing antibiotic, pt agreeable Fluids , rest and work excuse for 2 additional days

## 2014-09-26 ENCOUNTER — Other Ambulatory Visit: Payer: Self-pay | Admitting: Family Medicine

## 2014-09-26 ENCOUNTER — Ambulatory Visit (HOSPITAL_COMMUNITY): Payer: Self-pay | Admitting: Psychiatry

## 2014-09-26 ENCOUNTER — Ambulatory Visit: Payer: BLUE CROSS/BLUE SHIELD | Admitting: Obstetrics

## 2014-10-01 ENCOUNTER — Encounter: Payer: Self-pay | Admitting: Gastroenterology

## 2014-10-19 ENCOUNTER — Encounter: Payer: BLUE CROSS/BLUE SHIELD | Attending: "Endocrinology | Admitting: Nutrition

## 2014-10-19 ENCOUNTER — Encounter: Payer: Self-pay | Admitting: Nutrition

## 2014-10-19 VITALS — Ht 65.0 in | Wt 224.6 lb

## 2014-10-19 DIAGNOSIS — Z713 Dietary counseling and surveillance: Secondary | ICD-10-CM | POA: Insufficient documentation

## 2014-10-19 DIAGNOSIS — E118 Type 2 diabetes mellitus with unspecified complications: Secondary | ICD-10-CM | POA: Diagnosis present

## 2014-10-19 DIAGNOSIS — E669 Obesity, unspecified: Secondary | ICD-10-CM | POA: Insufficient documentation

## 2014-10-19 DIAGNOSIS — E1165 Type 2 diabetes mellitus with hyperglycemia: Secondary | ICD-10-CM

## 2014-10-19 DIAGNOSIS — Z6837 Body mass index (BMI) 37.0-37.9, adult: Secondary | ICD-10-CM | POA: Diagnosis not present

## 2014-10-19 DIAGNOSIS — IMO0002 Reserved for concepts with insufficient information to code with codable children: Secondary | ICD-10-CM

## 2014-10-19 NOTE — Patient Instructions (Signed)
Goals:  Follow Diabetes Meal Plan as instructed  Eat 3 balanced meals daily.  Avoid snacks between meals.  Increase fresh fruits and vegetables daily.  Limit carbohydrate intake to 30-45 grams carbohydrate/meal       Monitor glucose levels as instructed by your doctor  Aim for 60 mins of physical activity daily 3-4 times per week.  Keep a food journal  Check blood sugars in am and 2 hours after dinner or at bedtime to assess BS control at night a few times per week.  Bring food record and glucose log to your next nutrition visit  Lose 1 lb per week til next visit.

## 2014-10-19 NOTE — Progress Notes (Signed)
  Medical Nutrition Therapy:  Appt start time: 2458 end time:  1630. Assessment:  Primary concerns today: Diabetes Type 2 and weight loss. LIves with her husband and 2 children. She does the shopping and her husband does the cooking. Most foods are baked, broiled, fried.    Checking blood sugars. FBS 148 this. Usually 140-160's. BS in evenings are 200's.  She admits to knowing she needs to work on her diet to improve blood sugars. Most recent A1c was 8.5%. Eats a lot of snacks between meals. Sees Dr.Simpson. Allergic to Peanuts.  PMH: Gastroparesis. Had DM 10 yrs. Physical activity: once a week walked.   Diet is excessive in CHO at certain times of the day. Physical activity insuffient. Not checking blood sugars at night and will ask her to start checking before supper or HS blood sugars to assess BS in evenings.  Preferred Learning Style:   Auditory  Visual  Hands on  Learning Readiness:    Ready  Change in progress   MEDICATIONS: See list   DIETARY INTAKE:   24-hr recall:  B ( AM):  2 eggs with [place] ham OR 2 mini bagel sandwiches with water, coffee Snk ( AM): greek yogurt with fruit or apple slices or cheeese sticks.  L ( PM): Soy butter with with reduced sugar smuckers on white bread. Water Snk ( PM): hummus or apple and cheese D ( PM): HOney Comb cereal, 1/3 mac and cheese and few carrots, water Snk ( PM):  Beverages: water  Usual physical activity: ADL's. Walks some with baby strolled.  Estimated energy needs: 1500  calories 170 g carbohydrates 112 g protein 42 g fat  Progress Towards Goal(s):  In progress.   Nutritional Diagnosis:  NB-1.1 Food and nutrition-related knowledge deficit As related to Diabetes.  As evidenced by A1C >8%..    Intervention:  Nutrition and diabetes education provided on diet, meal planning,  Diabetes disease, complications, target ranges for BS, CHO Counting, monitoring and importance of exercise for BS and weight  loss.  Goals:  Follow Diabetes Meal Plan as instructed  Eat 3 balanced meals daily.  Avoid snacks between meals.  Increase fresh fruits and vegetables daily.  Limit carbohydrate intake to 30-45 grams carbohydrate/meal       Monitor glucose levels as instructed by your doctor  Aim for 60 mins of physical activity daily 3-4 times per week.  Keep a food journal  Check blood sugars in am and 2 hours after dinner or at bedtime to assess BS control at night a few times per week.  Bring food record and glucose log to your next nutrition visit  Lose 1 lb per week til next visit.   Teaching Method Utilized:  Visual Auditory Hands on  Handouts given during visit include: The Plate Method Carb Counting and Food Label handouts Meal Plan Card   Barriers to learning/adherence to lifestyle change: none  Demonstrated degree of understanding via:  Teach Back   Monitoring/Evaluation:  Dietary intake, exercise, meal planning, and body weight in 1 month(s).

## 2014-10-29 ENCOUNTER — Other Ambulatory Visit: Payer: Self-pay | Admitting: Family Medicine

## 2014-10-31 ENCOUNTER — Encounter (HOSPITAL_COMMUNITY): Payer: Self-pay | Admitting: Psychiatry

## 2014-10-31 ENCOUNTER — Ambulatory Visit (INDEPENDENT_AMBULATORY_CARE_PROVIDER_SITE_OTHER): Payer: BLUE CROSS/BLUE SHIELD | Admitting: Psychiatry

## 2014-10-31 VITALS — BP 136/90 | HR 86 | Ht 65.0 in | Wt 227.0 lb

## 2014-10-31 DIAGNOSIS — F319 Bipolar disorder, unspecified: Secondary | ICD-10-CM

## 2014-10-31 MED ORDER — ARIPIPRAZOLE 5 MG PO TABS: 5.0000 mg | ORAL_TABLET | Freq: Every day | ORAL | Status: DC

## 2014-10-31 NOTE — Progress Notes (Signed)
[facility] [phone] Progress Note  Christina [name] [id number] 39 y.o.  10/31/2014 11:40 AM  Chief Complaint:  Medication management and followup.     History of Present Illness:  Christina [name] came for her appointment.  She is taking her Abilify 5 mg which is helping her mood swing, anger, paranoia and hallucination.  She recently seen her primary care physician.  Time spent sinusitis , infection and management of her diabetes.  Her last blood work shows high her hemoglobin A1c and she admitted noncompliance with diet and exercise.  Recently she visited diabetic nurse and she is trying her best to follow recommendations from the dietitian.  She has cut down some weight from the past but sometimes she is not compliant with her diet.  We talk about other options of bipolar medications since Abilify may contribute hyperglycemia.  However patient is very reluctant to change her medication at this time.  She understands that she needs to change her lifestyle and dietary habits to deal with her diabetes.  She recently seen by ophthalmologist and she may require more workup but she does not have any issues with the vision.  She is concerned about long-term complications of diabetes.  Overall her mood has been stable.  She denies any agitation, anger, severe mood swing.  She sleeping good.  Her energy level is okay.  She started working full-time and sometimes she has some stress because she is not getting a lot of referrals but she is working on it.  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  Patient denies drinking or using any illegal substances.  Her vitals are stable.  Patient lives with her husband who is very supportive.  Suicidal Ideation: No Plan Formed: No Patient has means to carry out plan: No  Homicidal Ideation: No Plan Formed: No Patient has means to carry out plan: No  Medical History; Her primary care physician is Dr. Tula Nakayama.  Patient  has history of diabetes , hypertension, gastroparesis, goiter, proteinuria and GERD.  Past Psychiatric History/Hospitalization(s) Patient has been seeing in this office since 2006. She was referred from her primary care physician Dr. Moshe Cipro. She has history of depression and mania and psychosis. In 1997 she was admitted at [facility] because of suicidal gesture.  In the past she has taken Prozac and Wellbutrin.  She denies any history of sexual, verbal emotional abuse.   Anxiety: Yes Bipolar Disorder: Yes Depression: Yes Mania: Yes Psychosis: Yes Schizophrenia: No Personality Disorder: No Hospitalization for psychiatric illness: Yes History of Electroconvulsive Shock Therapy: No Prior Suicide Attempts: No   Review of Systems  Skin: Negative for itching and rash.  Neurological: Negative.   Psychiatric/Behavioral: Negative for depression, suicidal ideas, hallucinations and substance abuse. The patient is not nervous/anxious and does not have insomnia.    Psychiatric: Agitation: No Hallucination: No Depressed Mood: No Insomnia: No Hypersomnia: No Altered Concentration: No Feels Worthless: No Grandiose Ideas: No Belief In Special Powers: No New/Increased Substance Abuse: No Compulsions: No  Neurologic: Headache: No Seizure: No Paresthesias: No    Outpatient Encounter Prescriptions as of 10/31/2014  Medication Sig  . amLODipine (NORVASC) 10 MG tablet Take 10 mg by mouth daily.  . ARIPiprazole (ABILIFY) 5 MG tablet Take 1 tablet (5 mg total) by mouth daily.  Marland Kitchen aspirin 81 MG tablet Take 81 mg by mouth daily.  . Blood Glucose Monitoring Suppl (ACCU-CHEK AVIVA PLUS) W/DEVICE KIT 1 kit to test blood sugars daily dx  250.00  . dapagliflozin propanediol (FARXIGA) 10 MG TABS tablet Take 10 mg by mouth daily.  Marland Kitchen glipiZIDE (GLUCOTROL XL) 10 MG 24 hr tablet TAKE ONE TABLET BY MOUTH ONCE DAILY WITH BREAKFAST  . glipiZIDE (GLUCOTROL XL) 5 MG 24 hr tablet TAKE ONE TABLET BY MOUTH ONCE DAILY  WITH  BREAKFAST  . metFORMIN (GLUCOPHAGE) 1000 MG tablet Take 1 tablet (1,000 mg total) by mouth 2 (two) times daily with a meal.  . montelukast (SINGULAIR) 10 MG tablet Take 1 tablet (10 mg total) by mouth at bedtime.  Marland Kitchen omeprazole (PRILOSEC) 20 MG capsule Take 20 mg by mouth daily.  Marland Kitchen terconazole (TERAZOL 7) 0.4 % vaginal cream Place 1 applicator vaginally at bedtime. For 2 weeks  . triamterene-hydrochlorothiazide (DYAZIDE) 50-25 MG per capsule Take 1 capsule by mouth every morning.  . [DISCONTINUED] ARIPiprazole (ABILIFY) 5 MG tablet Take 1 tablet (5 mg total) by mouth daily.  . [DISCONTINUED] doxycycline (VIBRA-TABS) 100 MG tablet Take 1 tablet (100 mg total) by mouth 2 (two) times daily. (Patient not taking: Reported on 10/19/2014)  . [DISCONTINUED] fluconazole (DIFLUCAN) 150 MG tablet TAKE ONE TABLET BY MOUTH ONCE DAILY, AS NEEDED, FOR VAGINAL ITCH DUE TO ANTIBIOTIC (Patient not taking: Reported on 10/19/2014)  . [DISCONTINUED] levofloxacin (LEVAQUIN) 500 MG tablet Take 1 tablet (500 mg total) by mouth daily. (Patient not taking: Reported on 10/19/2014)    Physical Exam: Constitutional:  BP 136/90 mmHg  Pulse 86  Ht 5' 5"  (1.651 m)  Wt 227 lb (102.967 kg)  BMI 37.77 kg/m2  Recent Results (from the past 2160 hour(s))  Hemoglobin A1c     Status: Abnormal   Collection Time: 08/06/14  2:08 PM  Result Value Ref Range   Hgb A1c MFr Bld 8.5 (H) <5.7 %    Comment:                                                                        According to the ADA Clinical Practice Recommendations for 2011, when HbA1c is used as a screening test:     >=6.5%   Diagnostic of Diabetes Mellitus            (if abnormal result is confirmed)   5.7-6.4%   Increased risk of developing Diabetes Mellitus   References:Diagnosis and Classification of Diabetes Mellitus,Diabetes HFW[place],6378,58(IFOYD 1):S62-S69 and Standards of Medical Care in         Diabetes - 2011,Diabetes Care,2011,34 (Suppl 1):S11-S61.       Mean Plasma Glucose 197 (H) <117 mg/dL  BASIC METABOLIC PANEL WITH GFR     Status: Abnormal   Collection Time: 08/06/14  2:08 PM  Result Value Ref Range   Sodium 138 135 - 145 mEq/L   Potassium 3.7 3.5 - 5.3 mEq/L   Chloride 102 96 - 112 mEq/L   CO2 27 19 - 32 mEq/L   Glucose, Bld 139 (H) 70 - 99 mg/dL   BUN 8 6 - 23 mg/dL   Creat 0.85 0.50 - 1.10 mg/dL   Calcium 9.9 8.4 - 10.5 mg/dL   GFR, Est African American >89 mL/min   GFR, Est Non African American 87 mL/min    Comment:   The estimated GFR is a calculation valid for  adults (>=40 years old) that uses the CKD-EPI algorithm to adjust for age and sex. It is   not to be used for children, pregnant women, hospitalized patients,    patients on dialysis, or with rapidly changing kidney function. According to the NKDEP, eGFR >89 is normal, 60-89 shows mild impairment, 30-59 shows moderate impairment, 15-29 shows severe impairment and <15 is ESRD.     POCT glucose (manual entry)     Status: Abnormal   Collection Time: 08/07/14  8:43 AM  Result Value Ref Range   POC Glucose 237 (A) 70 - 99 mg/dl    Comment: random   Respiratory or Resp and Sputum Culture     Status: None   Collection Time: 09/19/14 10:49 AM  Result Value Ref Range   Gram Stain Moderate    Gram Stain WBC present-predominately PMN    Gram Stain Rare Squamous Epithelial Cells Present    Gram Stain Few Gram Variable Rods    Gram Stain Rare Gram Positive Cocci In Pairs    Organism ID, Bacteria Normal Oropharyngeal Flora   CBC with Differential/Platelet     Status: Abnormal   Collection Time: 09/19/14 11:12 AM  Result Value Ref Range   WBC 6.4 4.0 - 10.5 K/uL   RBC 4.23 3.87 - 5.11 MIL/uL   Hemoglobin 11.8 (L) 12.0 - 15.0 g/dL   HCT 36.6 36.0 - 46.0 %   MCV 86.5 78.0 - 100.0 fL   MCH 27.9 26.0 - 34.0 pg   MCHC 32.2 30.0 - 36.0 g/dL   RDW 14.4 11.5 - 15.5 %   Platelets 362 150 - 400 K/uL   MPV 8.4 (L) 8.6 - 12.4 fL   Neutrophils Relative % 56 43 - 77 %    Neutro Abs 3.6 1.7 - 7.7 K/uL   Lymphocytes Relative 33 12 - 46 %   Lymphs Abs 2.1 0.7 - 4.0 K/uL   Monocytes Relative 8 3 - 12 %   Monocytes Absolute 0.5 0.1 - 1.0 K/uL   Eosinophils Relative 2 0 - 5 %   Eosinophils Absolute 0.1 0.0 - 0.7 K/uL   Basophils Relative 1 0 - 1 %   Basophils Absolute 0.1 0.0 - 0.1 K/uL   Smear Review Criteria for review not met   Microalbumin / creatinine urine ratio     Status: Abnormal   Collection Time: 09/19/14  2:52 PM  Result Value Ref Range   Microalb, Ur 8.3 (H) <2.0 mg/dL    Comment: The ADA (Diabetes Care 2014;37(suppl 1):S14-S80) has defined abnormalities in albumin excretion as follows:            Category           Result                            (mg/g creatinine)                 Normal:    <30       Microalbuminuria:    30 - 299   Clinical albuminuria:    > or = 300    The ADA recommends that at least two of three specimens collected within a 3 - 6 month period be abnormal before considering a patient to be within a diagnostic category.    Creatinine, Urine 204.0 mg/dL    Comment: No reference range established.   Microalb Creat Ratio 40.7 (H) 0.0 - 30.0 mg/g  Musculoskeletal: Strength & Muscle Tone: within normal limits Gait & Station: normal Patient leans: Front  Mental Status Examination;  Patient is a middle-aged female who is casually dressed and fairly groomed.  She described her mood euthymic and her affect is mood appropriate.  Her speech is fast but clear and coherent.  Her thought processes is logical and goal directed.  She denies any active or passive suicidal thoughts or homicidal thoughts.  There were no paranoia or delusions present at this time.  Her fund of knowledge is adequate.  She is alert and oriented x3.  Her attention and concentration is fair.  Her insight judgment and impulse control is okay.   Established Problem, Stable/Improving (1), Review of Psycho-Social Stressors (1), Review or order clinical lab  tests (1), New Problem, with no additional work-up planned (3), Review of Last Therapy Session (1) and Review of Medication Regimen & Side Effects (2)  Assessment: Axis I: Bipolar disorder NOS  Axis II: Deferred  Axis III:  Past Medical History  Diagnosis Date  . GERD (gastroesophageal reflux disease)   . Bipolar disorder   . Gastroparesis   . 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 Hypertension     chronic on meds  . Diabetes mellitus     type 2 DM, insulin only needed during pregnancy  . Chronic kidney disease     proteinuria    Plan:  Patient is doing better on Abilify 5 mg. she has no tremors, EPS or any side effects.  I review blood work, collateral information from primary care physician and her current medication.  We talked about switching to a different medication since patient has struggling to lower her hemoglobin A1c and we talk about metabolic syndrome could be related to Abilify.  However patient does not want to try any other medication , she had a good response with Abilify.  She understand that her high hemoglobin A1c is due to noncompliance with her diet and she promised that she will change her lifestyle and dietary habits.  Discussed medication side effects and benefits.  Recommended to call us back if she has any question or any concern.  Continue Abilify 5 mg daily.  Follow-up in 3 months.  Time spent 25 minutes.  More than 50% of the time spent in psychoeducation, counseling and coordination of care.  Discuss safety plan that anytime having active suicidal thoughts or homicidal thoughts then patient need to call 911 or go to the local emergency room.    Noemi Bellissimo T., MD 10/31/2014

## 2014-11-01 DIAGNOSIS — A5901 Trichomonal vulvovaginitis: Secondary | ICD-10-CM | POA: Insufficient documentation

## 2014-11-13 LAB — COMPLETE METABOLIC PANEL WITH GFR
ALT: 18 U/L (ref 0–35)
AST: 15 U/L (ref 0–37)
Albumin: 4.4 g/dL (ref 3.5–5.2)
Alkaline Phosphatase: 64 U/L (ref 39–117)
BUN: 9 mg/dL (ref 6–23)
CO2: 24 mEq/L (ref 19–32)
Calcium: 9.3 mg/dL (ref 8.4–10.5)
Chloride: 105 mEq/L (ref 96–112)
Creat: 0.82 mg/dL (ref 0.50–1.10)
GFR, Est African American: >89 mL/min
GFR, Est Non African American: >89 mL/min
Glucose, Bld: 141 mg/dL — ABNORMAL HIGH (ref 70–99)
Potassium: 3.5 mEq/L (ref 3.5–5.3)
Sodium: 140 mEq/L (ref 135–145)
Total Bilirubin: 0.3 mg/dL (ref 0.2–1.2)
Total Protein: 7.3 g/dL (ref 6.0–8.3)

## 2014-11-13 LAB — HEMOGLOBIN A1C
Hgb A1c MFr Bld: 8 % — ABNORMAL HIGH (ref <5.7)
Mean Plasma Glucose: 183 mg/dL — ABNORMAL HIGH (ref <117)

## 2014-11-14 ENCOUNTER — Ambulatory Visit (INDEPENDENT_AMBULATORY_CARE_PROVIDER_SITE_OTHER): Payer: BLUE CROSS/BLUE SHIELD | Admitting: Family Medicine

## 2014-11-14 ENCOUNTER — Encounter: Payer: Self-pay | Admitting: Family Medicine

## 2014-11-14 ENCOUNTER — Other Ambulatory Visit: Payer: Self-pay

## 2014-11-14 VITALS — BP 124/84 | HR 99 | Resp 16 | Ht 65.0 in | Wt 224.0 lb

## 2014-11-14 DIAGNOSIS — J302 Other seasonal allergic rhinitis: Secondary | ICD-10-CM

## 2014-11-14 DIAGNOSIS — I10 Essential (primary) hypertension: Secondary | ICD-10-CM

## 2014-11-14 DIAGNOSIS — H6090 Unspecified otitis externa, unspecified ear: Secondary | ICD-10-CM | POA: Insufficient documentation

## 2014-11-14 DIAGNOSIS — H6093 Unspecified otitis externa, bilateral: Secondary | ICD-10-CM | POA: Diagnosis not present

## 2014-11-14 DIAGNOSIS — Z13220 Encounter for screening for lipoid disorders: Secondary | ICD-10-CM

## 2014-11-14 DIAGNOSIS — IMO0001 Reserved for inherently not codable concepts without codable children: Secondary | ICD-10-CM

## 2014-11-14 DIAGNOSIS — D509 Iron deficiency anemia, unspecified: Secondary | ICD-10-CM

## 2014-11-14 DIAGNOSIS — K219 Gastro-esophageal reflux disease without esophagitis: Secondary | ICD-10-CM

## 2014-11-14 DIAGNOSIS — F319 Bipolar disorder, unspecified: Secondary | ICD-10-CM

## 2014-11-14 DIAGNOSIS — R809 Proteinuria, unspecified: Secondary | ICD-10-CM

## 2014-11-14 DIAGNOSIS — E1165 Type 2 diabetes mellitus with hyperglycemia: Secondary | ICD-10-CM

## 2014-11-14 MED ORDER — LOSARTAN POTASSIUM 25 MG PO TABS: 25.0000 mg | ORAL_TABLET | Freq: Every day | ORAL | Status: DC

## 2014-11-14 MED ORDER — FLUCONAZOLE 150 MG PO TABS: 150.0000 mg | ORAL_TABLET | ORAL | Status: DC

## 2014-11-14 MED ORDER — HYDROCORTISONE-ACETIC ACID 1-2 % OT SOLN: 3.0000 [drp] | Freq: Two times a day (BID) | OTIC | Status: DC

## 2014-11-14 MED ORDER — GLIPIZIDE ER 10 MG PO TB24: ORAL_TABLET | ORAL | Status: DC

## 2014-11-14 NOTE — Patient Instructions (Signed)
F/u in 3 month, call if you need me befoore  Congrats on weight loss and improved blood sugar  Measure food and count steps, also srol with children every pM for 30 mions  Increase glucotrol to 53m daily, TAKE  tWO 10 mg tabs together with breakfast (OK to take two 511mtabs as one 10 mg tab till done)  New for k kidney protection and blood pressure is cozaar 25 mg one daily, if you develop tickle and dry cough stop, let usKoreanow this is an allergic reaction   Topical ear drop sent for inflamed outer ear canals  Fasting lipid, cmp and EGFr, hBA1C in 3 month

## 2014-11-19 DIAGNOSIS — F319 Bipolar disorder, unspecified: Secondary | ICD-10-CM | POA: Insufficient documentation

## 2014-11-19 NOTE — Assessment & Plan Note (Signed)
improved Patient re-educated about  the importance of commitment to a  minimum of 150 minutes of exercise per week.  The importance of healthy food choices with portion control discussed. Encouraged to start a food diary, count calories and to consider  joining a support group. Sample diet sheets offered. Goals set by the patient for the next several months.   Weight /BMI 11/14/2014 10/19/2014 09/19/2014  WEIGHT 224 lb 224 lb 9.6 oz 231 lb  HEIGHT 5\' 5"  5\' 5"  5\' 5"   BMI 37.28 kg/m2 37.38 kg/m2 38.44 kg/m2  Some encounter information is confidential and restricted. Go to Review Flowsheets activity to see all data.    Current exercise per week 90 minutes.

## 2014-11-19 NOTE — Assessment & Plan Note (Signed)
Controlled, but diastolic pressure still elevated above goal. Add cozaar DASH diet and commitment to daily physical activity for a minimum of 30 minutes discussed and encouraged, as a part of hypertension management. The importance of attaining a healthy weight is also discussed.  BP/Weight 11/14/2014 10/19/2014 09/19/2014 09/03/2014 08/07/2014 06/25/2014 64/84/7207  Systolic BP 218 - 288 337 445 146 047  Diastolic BP 84 - 82 80 82 84 87  Wt. (Lbs) 224 224.6 231 231.12 231 232 233  BMI 37.28 37.38 38.44 38.46 38.44 38.61 38.77  Some encounter information is confidential and restricted. Go to Review Flowsheets activity to see all data.

## 2014-11-19 NOTE — Assessment & Plan Note (Signed)
Controlled, no change in medication  

## 2014-11-19 NOTE — Progress Notes (Signed)
Subjective:    Patient ID: Christina [name], female    DOB: March 15, 1976, 39 y.o.   MRN: [id number]  HPI The PT is here for follow up and re-evaluation of chronic medical conditions, medication management and review of any available recent lab and radiology data.  Preventive health is updated, specifically  Cancer screening and Immunization.   Questions or concerns regarding consultations or procedures which the PT has had in the interim are  Addressed.Found nutrition class to be very beneficial, has invested in food scale to help with portion control, and  Has lost weight and improved blood sugar, all very good The PT denies any adverse reactions to current medications since the last visit.  Denies polyuria, polydipsia, blurred vision , or hypoglycemic episodes. Plans to commit to more regular exercise C/o bilateral outer ear pain and itch x 5 days, no drainage from ears , no fever    Review of Systems See HPI Denies recent fever or chills. Denies sinus pressure, nasal congestion, or sore throat. Denies chest congestion, productive cough or wheezing. Denies chest pains, palpitations and leg swelling Denies abdominal pain, nausea, vomiting,diarrhea or constipation.   Denies dysuria, frequency, hesitancy or incontinence. Denies joint pain, swelling and limitation in mobility. Denies headaches, seizures, numbness, or tingling. Denies uncontrolled  depression, anxiety or insomnia. Denies skin break down or rash.        Objective:   Physical Exam BP 124/84 mmHg  Pulse 99  Resp 16  Ht 5\' 5"  (1.651 m)  Wt 224 lb (101.606 kg)  BMI 37.28 kg/m2  SpO2 99% Patient alert and oriented and in no cardiopulmonary distress.  HEENT: No facial asymmetry, EOMI,   oropharynx pink and moist.  Neck supple no JVD, no mass. External ear canals mildly erythematous bilaterally, TM clear with good light reflex Chest: Clear to auscultation bilaterally.  CVS: S1, S2 no murmurs, no S3.Regular  rate.  ABD: Soft non tender.   Ext: No edema  MS: Adequate ROM spine, shoulders, hips and knees.  Skin: Intact, no ulcerations or rash noted.  Psych: Good eye contact, normal affect. Memory intact not anxious or depressed appearing.  CNS: CN 2-12 intact, power,  normal throughout.no focal deficits noted.        Assessment & Plan:  Essential hypertension, benign Controlled, but diastolic pressure still elevated above goal. Add cozaar DASH diet and commitment to daily physical activity for a minimum of 30 minutes discussed and encouraged, as a part of hypertension management. The importance of attaining a healthy weight is also discussed.  BP/Weight 11/14/2014 10/19/2014 09/19/2014 09/03/2014 08/07/2014 06/25/2014 20/94/7096  Systolic BP 283 - 662 947 654 650 354  Diastolic BP 84 - 82 80 82 84 87  Wt. (Lbs) 224 224.6 231 231.12 231 232 233  BMI 37.28 37.38 38.44 38.46 38.44 38.61 38.77  Some encounter information is confidential and restricted. Go to Review Flowsheets activity to see all data.         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 Improved, pt applauded on this, inc glipizide to 20 mg daily Add ARB for renal protection Patient educated about the importance of limiting  Carbohydrate intake , the need to commit to daily physical activity for a minimum of 30 minutes , and to commit weight loss.test reguraly and take meds as prescribed The fact that changes in all these areas will improve blood sugar control and limit complications is stressed  Diabetic Labs Latest Ref Rng 11/12/2014 09/19/2014 08/06/2014 04/17/2014 03/05/2014  HbA1c <5.7 % 8.0(H) - 8.5(H) 7.7(H) -  Microalbumin <2.0 mg/dL - 8.3(H) - - -  Micro/Creat Ratio 0.0 - 30.0 mg/g - 40.7(H) - - -  Chol 0 - 200 mg/dL - - - - -  HDL >39 mg/dL - - - - -  Calc LDL 0 - 99 mg/dL - - - - -  Triglycerides <150 mg/dL - - - - -  Creatinine 0.50 - 1.10 mg/dL 0.82 - 0.85 0.93 0.75   BP/Weight  11/14/2014 10/19/2014 09/19/2014 09/03/2014 08/07/2014 06/25/2014 62/83/1517  Systolic BP 616 - 073 710 626 948 546  Diastolic BP 84 - 82 80 82 84 87  Wt. (Lbs) 224 224.6 231 231.12 231 232 233  BMI 37.28 37.38 38.44 38.46 38.44 38.61 38.77  Some encounter information is confidential and restricted. Go to Review Flowsheets activity to see all data.   Foot/eye exam completion dates 09/03/2014 08/16/2013  Foot Form Completion Done Done        Otitis externa Twice daily topical med for 5 to 7 dys, not infected, just inflamed   Obesity, Class II, BMI 35-39.9, with comorbidity improved Patient re-educated about  the importance of commitment to a  minimum of 150 minutes of exercise per week.  The importance of healthy food choices with portion control discussed. Encouraged to start a food diary, count calories and to consider  joining a support group. Sample diet sheets offered. Goals set by the patient for the next several months.   Weight /BMI 11/14/2014 10/19/2014 09/19/2014  WEIGHT 224 lb 224 lb 9.6 oz 231 lb  HEIGHT 5\' 5"  5\' 5"  5\' 5"   BMI 37.28 kg/m2 37.38 kg/m2 38.44 kg/m2  Some encounter information is confidential and restricted. Go to Review Flowsheets activity to see all data.    Current exercise per week 90 minutes.    Bipolar 1 disorder Controled , treated by psychiatry   Seasonal allergies Controlled, no change in medication    GERD Controlled, no change in medication    Iron deficiency anemia Improved, reduce iron supplement to 3 times weekly

## 2014-11-19 NOTE — Assessment & Plan Note (Signed)
Twice daily topical med for 5 to 7 dys, not infected, just inflamed

## 2014-11-19 NOTE — Assessment & Plan Note (Signed)
Controled , treated by psychiatry

## 2014-11-19 NOTE — Assessment & Plan Note (Signed)
Improved, pt applauded on this, inc glipizide to 20 mg daily Add ARB for renal protection Patient educated about the importance of limiting  Carbohydrate intake , the need to commit to daily physical activity for a minimum of 30 minutes , and to commit weight loss.test reguraly and take meds as prescribed The fact that changes in all these areas will improve blood sugar control and limit complications is stressed  Diabetic Labs Latest Ref Rng 11/12/2014 09/19/2014 08/06/2014 04/17/2014 03/05/2014  HbA1c <5.7 % 8.0(H) - 8.5(H) 7.7(H) -  Microalbumin <2.0 mg/dL - 8.3(H) - - -  Micro/Creat Ratio 0.0 - 30.0 mg/g - 40.7(H) - - -  Chol 0 - 200 mg/dL - - - - -  HDL >39 mg/dL - - - - -  Calc LDL 0 - 99 mg/dL - - - - -  Triglycerides <150 mg/dL - - - - -  Creatinine 0.50 - 1.10 mg/dL 0.82 - 0.85 0.93 0.75   BP/Weight 11/14/2014 10/19/2014 09/19/2014 09/03/2014 08/07/2014 06/25/2014 90/24/0973  Systolic BP 532 - 992 426 834 196 222  Diastolic BP 84 - 82 80 82 84 87  Wt. (Lbs) 224 224.6 231 231.12 231 232 233  BMI 37.28 37.38 38.44 38.46 38.44 38.61 38.77  Some encounter information is confidential and restricted. Go to Review Flowsheets activity to see all data.   Foot/eye exam completion dates 09/03/2014 08/16/2013  Foot Form Completion Done Done

## 2014-11-20 NOTE — Assessment & Plan Note (Signed)
Improved, reduce iron supplement to 3 times weekly

## 2014-12-05 ENCOUNTER — Other Ambulatory Visit: Payer: Self-pay | Admitting: Family Medicine

## 2014-12-05 ENCOUNTER — Telehealth: Payer: Self-pay

## 2014-12-05 ENCOUNTER — Encounter: Payer: Self-pay | Admitting: Family Medicine

## 2014-12-05 MED ORDER — TRIAMTERENE-HCTZ 37.5-25 MG PO TABS: ORAL_TABLET | ORAL | Status: DC

## 2014-12-05 NOTE — Telephone Encounter (Signed)
I listed two options. Which one are you referring to?

## 2014-12-05 NOTE — Telephone Encounter (Signed)
It was originally prescribed by GYN Baltazar Najjar in Jan 2015.

## 2014-12-05 NOTE — Telephone Encounter (Signed)
Patient aware and med sent to pharmacy.  

## 2014-12-05 NOTE — Telephone Encounter (Signed)
Yes tha t will work, pls change the way prescribed and let her know

## 2014-12-05 NOTE — Telephone Encounter (Signed)
Pls notify pt asnd pharmacy change to maxzide 37.5 mg one and a half once daily, script is printed pls fax also, thanks

## 2014-12-05 NOTE — Telephone Encounter (Signed)
The copay for her BP med is $50 The tramterene 50/25 isn't on the $4 list. But the 37.5/25 and the 75/50 are. Will either of these work for her?

## 2014-12-06 ENCOUNTER — Ambulatory Visit: Payer: Self-pay | Admitting: Nutrition

## 2014-12-06 NOTE — Telephone Encounter (Signed)
Had a root canal yesterday and now her face is swollen and the [facility] said she looked streppy. Didn't know if the infection got into her root canal. The oral surgeon told her if she was still swollen they needed to see her tomorrow. I told her if the swelling gets worse today to call them back so they could possibly see her today. She is on clindamycin 150 qid and alternating tylenol with ibuprofen. Will call to follow up tomorrow with what she ends up doing.

## 2015-02-01 ENCOUNTER — Encounter (HOSPITAL_COMMUNITY): Payer: Self-pay | Admitting: Psychiatry

## 2015-02-01 ENCOUNTER — Ambulatory Visit (INDEPENDENT_AMBULATORY_CARE_PROVIDER_SITE_OTHER): Payer: BLUE CROSS/BLUE SHIELD | Admitting: Psychiatry

## 2015-02-01 VITALS — BP 128/84 | HR 104 | Ht 65.0 in | Wt 226.4 lb

## 2015-02-01 DIAGNOSIS — F319 Bipolar disorder, unspecified: Secondary | ICD-10-CM | POA: Diagnosis not present

## 2015-02-01 MED ORDER — ARIPIPRAZOLE 5 MG PO TABS: 5.0000 mg | ORAL_TABLET | Freq: Every day | ORAL | Status: DC

## 2015-02-01 NOTE — Progress Notes (Signed)
[facility] Progress Note  Christina [name] [id number] 39 y.o.  02/01/2015 10:29 AM  Chief Complaint:  Medication management and followup.     History of Present Illness:  Christina [name] came for her appointment.  She is taking Abilify 5 mg daily and feeling much better.  She denies any irritability, anger, paranoia or any severe mood swings.  Her sleep is good.  She is off from her work for 30 days and she is very happy about it.  She has plan to go each with the family.  Patient will get blood work in a few days.  She had lost hemoglobin A1c 2 months ago and she was pleased that her numbers were dropped.  She is working hard to control her blood sugar.  She denies any feeling of hopelessness or worthlessness.  She feel Abilify working very well.  Her appetite is okay.  She has high pulse but poor pressure is okay.  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  Patient lives with her husband who is very supportive.  Suicidal Ideation: No Plan Formed: No Patient has means to carry out plan: No  Homicidal Ideation: No Plan Formed: No Patient has means to carry out plan: No  Medical History; Her primary care physician is Dr. Tula Nakayama.  Patient has history of diabetes , hypertension, gastroparesis, goiter, proteinuria and GERD.  Past Psychiatric History/Hospitalization(s) Patient has been seeing in this office since 2006. She was referred from her primary care physician Dr. Moshe Cipro. She has history of depression and mania and psychosis. In 1997 she was admitted at [facility] because of suicidal gesture.  In the past she has taken Prozac and Wellbutrin.  She denies any history of sexual, verbal emotional abuse.   Anxiety: Yes Bipolar Disorder: Yes Depression: Yes Mania: Yes Psychosis: Yes Schizophrenia: No Personality Disorder: No Hospitalization for psychiatric illness: Yes History of Electroconvulsive  Shock Therapy: No Prior Suicide Attempts: No   Review of Systems  Skin: Negative for itching and rash.  Neurological: Negative.   Psychiatric/Behavioral: Negative for depression, suicidal ideas, hallucinations and substance abuse. The patient is not nervous/anxious and does not have insomnia.    Psychiatric: Agitation: No Hallucination: No Depressed Mood: No Insomnia: No Hypersomnia: No Altered Concentration: No Feels Worthless: No Grandiose Ideas: No Belief In Special Powers: No New/Increased Substance Abuse: No Compulsions: No  Neurologic: Headache: No Seizure: No Paresthesias: No    Outpatient Encounter Prescriptions as of 02/01/2015  Medication Sig  . acetic acid-hydrocortisone (VOSOL-HC) otic solution Place 3 drops into both ears 2 (two) times daily.  Marland Kitchen amLODipine (NORVASC) 10 MG tablet Take 10 mg by mouth daily.  . ARIPiprazole (ABILIFY) 5 MG tablet Take 1 tablet (5 mg total) by mouth daily.  . Blood Glucose Monitoring Suppl (ACCU-CHEK AVIVA PLUS) W/DEVICE KIT 1 kit to test blood sugars daily dx 250.00  . dapagliflozin propanediol (FARXIGA) 10 MG TABS tablet Take 10 mg by mouth daily.  . fluconazole (DIFLUCAN) 150 MG tablet Take 1 tablet (150 mg total) by mouth once a week.  Marland Kitchen glipiZIDE (GLUCOTROL XL) 10 MG 24 hr tablet Two tablets once daily  . losartan (COZAAR) 25 MG tablet Take 1 tablet (25 mg total) by mouth daily.  . metFORMIN (GLUCOPHAGE) 1000 MG tablet Take 1 tablet (1,000 mg total) by mouth 2 (two) times daily with a meal.  . montelukast (SINGULAIR) 10 MG tablet Take 1 tablet (10 mg  total) by mouth at bedtime.  Marland Kitchen omeprazole (PRILOSEC) 20 MG capsule Take 20 mg by mouth daily.  Marland Kitchen terconazole (TERAZOL 7) 0.4 % vaginal cream Place 1 applicator vaginally at bedtime. For 2 weeks  . triamterene-hydrochlorothiazide (MAXZIDE-25) 37.5-25 MG per tablet One and a half tablets once daily  . [DISCONTINUED] ARIPiprazole (ABILIFY) 5 MG tablet Take 1 tablet (5 mg total) by  mouth daily.   No facility-administered encounter medications on file as of 02/01/2015.    Physical Exam: Constitutional:  BP 128/84 mmHg  Pulse 104  Ht _0  (1.651 m)  Wt 226 lb 6.4 oz (102.694 kg)  BMI 37.67 kg/m2  Recent Results (from the past 2160 hour(s))  Hemoglobin A1c     Status: Abnormal   Collection Time: 11/12/14  4:07 PM  Result Value Ref Range   Hgb A1c MFr Bld 8.0 (H) <5.7 %    Comment:                                                                        According to the ADA Clinical Practice Recommendations for 2011, when HbA1c is used as a screening test:     >=6.5%   Diagnostic of Diabetes Mellitus            (if abnormal result is confirmed)   5.7-6.4%   Increased risk of developing Diabetes Mellitus   References:Diagnosis and Classification of Diabetes Mellitus,Diabetes GGYI,9485,46(EVOJJ 1):S62-S69 and Standards of Medical Care in         Diabetes - 2011,Diabetes Care,2011,34 (Suppl 1):S11-S61.      Mean Plasma Glucose 183 (H) <117 mg/dL  COMPLETE METABOLIC PANEL WITH GFR     Status: Abnormal   Collection Time: 11/12/14  4:07 PM  Result Value Ref Range   Sodium 140 135 - 145 mEq/L   Potassium 3.5 3.5 - 5.3 mEq/L   Chloride 105 96 - 112 mEq/L   CO2 24 19 - 32 mEq/L   Glucose, Bld 141 (H) 70 - 99 mg/dL   BUN 9 6 - 23 mg/dL   Creat 0.82 0.50 - 1.10 mg/dL   Total Bilirubin 0.3 0.2 - 1.2 mg/dL   Alkaline Phosphatase 64 39 - 117 U/L   AST 15 0 - 37 U/L   ALT 18 0 - 35 U/L   Total Protein 7.3 6.0 - 8.3 g/dL   Albumin 4.4 3.5 - 5.2 g/dL   Calcium 9.3 8.4 - 10.5 mg/dL   GFR, Est African American >89 mL/min   GFR, Est Non African American >89 mL/min    Comment:   The estimated GFR is a calculation valid for adults (>=26 years old) that uses the CKD-EPI algorithm to adjust for age and sex. It is   not to be used for children, pregnant women, hospitalized patients,    patients on dialysis, or with rapidly changing kidney function. According to the  NKDEP, eGFR >89 is normal, 60-89 shows mild impairment, 30-59 shows moderate impairment, 15-29 shows severe impairment and <15 is ESRD.       Musculoskeletal: Strength & Muscle Tone: within normal limits Gait & Station: normal Patient leans: Front  Mental Status Examination;  Patient is a middle-aged female who is casually dressed and fairly groomed.  She described her mood good and her affect is improved and appropriate.  Her speech is clear, coherent. Her thought processes is logical and goal directed.  She denies any active or passive suicidal thoughts or homicidal thoughts.  There were no paranoia or delusions present at this time.  Her fund of knowledge is adequate.  She is alert and oriented x3.  Her attention and concentration is fair.  Her insight judgment and impulse control is okay.   Established Problem, Stable/Improving (1), Review or order clinical lab tests (1), Review of Last Therapy Session (1) and Review of Medication Regimen & Side Effects (2)  Assessment: Axis I: Bipolar disorder NOS  Axis II: Deferred  Axis III:  Past Medical History  Diagnosis Date  . GERD (gastroesophageal reflux disease)   . Bipolar disorder   . Gastroparesis   . 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 Hypertension     chronic on meds  . Diabetes mellitus     type 2 DM, insulin only needed during pregnancy  . Chronic kidney disease     proteinuria    Plan:  Patient is doing better on Abilify 5 mg. she has no tremors, EPS or any side effects.  Discussed medication side effects and benefits.  Patient is going to have blood work in few days for her hemoglobin A1c.  Recommended to call us back if she has any question or any concern.  Follow-up in 3 months.  Macaulay Reicher T., MD 02/01/2015

## 2015-02-02 ENCOUNTER — Other Ambulatory Visit: Payer: Self-pay | Admitting: Family Medicine

## 2015-02-03 LAB — COMPLETE METABOLIC PANEL WITH GFR
ALT: 11 U/L (ref 0–35)
AST: 12 U/L (ref 0–37)
Albumin: 4.1 g/dL (ref 3.5–5.2)
Alkaline Phosphatase: 58 U/L (ref 39–117)
BUN: 9 mg/dL (ref 6–23)
CO2: 24 mEq/L (ref 19–32)
Calcium: 9 mg/dL (ref 8.4–10.5)
Chloride: 105 mEq/L (ref 96–112)
Creat: 0.82 mg/dL (ref 0.50–1.10)
GFR, Est African American: >89 mL/min
GFR, Est Non African American: >89 mL/min
Glucose, Bld: 126 mg/dL — ABNORMAL HIGH (ref 70–99)
Potassium: 3.9 mEq/L (ref 3.5–5.3)
Sodium: 140 mEq/L (ref 135–145)
Total Bilirubin: 0.5 mg/dL (ref 0.2–1.2)
Total Protein: 6.7 g/dL (ref 6.0–8.3)

## 2015-02-03 LAB — HEMOGLOBIN A1C
Hgb A1c MFr Bld: 7.6 % — ABNORMAL HIGH (ref <5.7)
Mean Plasma Glucose: 171 mg/dL — ABNORMAL HIGH (ref <117)

## 2015-02-06 ENCOUNTER — Encounter: Payer: Self-pay | Admitting: Nutrition

## 2015-02-06 ENCOUNTER — Encounter: Payer: BLUE CROSS/BLUE SHIELD | Attending: Family Medicine | Admitting: Nutrition

## 2015-02-06 VITALS — Ht 65.0 in | Wt 225.0 lb

## 2015-02-06 DIAGNOSIS — E118 Type 2 diabetes mellitus with unspecified complications: Secondary | ICD-10-CM | POA: Insufficient documentation

## 2015-02-06 DIAGNOSIS — E669 Obesity, unspecified: Secondary | ICD-10-CM | POA: Diagnosis not present

## 2015-02-06 DIAGNOSIS — Z713 Dietary counseling and surveillance: Secondary | ICD-10-CM | POA: Insufficient documentation

## 2015-02-06 DIAGNOSIS — IMO0002 Reserved for concepts with insufficient information to code with codable children: Secondary | ICD-10-CM

## 2015-02-06 DIAGNOSIS — E1165 Type 2 diabetes mellitus with hyperglycemia: Secondary | ICD-10-CM

## 2015-02-06 DIAGNOSIS — Z6838 Body mass index (BMI) 38.0-38.9, adult: Secondary | ICD-10-CM | POA: Diagnosis not present

## 2015-02-06 NOTE — Patient Instructions (Signed)
Goals:  Follow Diabetes Meal Plan as instructed  Work out 5 times per week for 150 mins a week.  Lose 10 lbs in three months.  Try to cook healthier meals.   Try to buy groceries at [employer] to save on grocery budge and afford more fresh fruits and vegetables.  Aim for A1C of 7.2%. In three months.  If having night sweats or symptoms of low blood sugars, test and if less than 70 treat low blood sugars. Notify Dr. Moshe Cipro if having low blood sugar symptoms--like night sweats etc. So she can consider stopping Glipizide

## 2015-02-06 NOTE — Progress Notes (Signed)
  Medical Nutrition Therapy:  Appt start time: 0830 end time:  0900. Assessment:  Primary concerns today: Diabetes Type 2 and weight loss. Had an abcess tooth in April.  Most a1C  7.6% down from 8%. Changes:  Following the DASH diet, fresh foods, less processed foods.  Whole grains  Multigrain cheerrios. Using more spices: trying to cook more. Her husband does most of the cooking. Working on increasing exercise. Has cut out snacks, sodas. Gastroparesis is bettter. Didn't bring blood sugar log or meter with her today. Will bring at next visit.  Preferred Learning Style:   Auditory  Visual  Hands on  Learning Readiness:    Ready  Change in progress   MEDICATIONS: See list   DIETARY INTAKE:  24-hr recall:  Eating three meals per day. Has cut out snacks. Drinking more water Beverages: water  Usual physical activity:Walks more and starting to play tennis. Estimated energy needs: 1500  calories 170 g carbohydrates 112 g protein 42 g fat  Progress Towards Goal(s):  In progress.   Nutritional Diagnosis:  NB-1.1 Food and nutrition-related knowledge deficit As related to Diabetes.  As evidenced by A1C >8%..    Intervention:  Nutrition and diabetes education provided on diet, meal planning,  Diabetes disease, complications, target ranges for BS, CHO Counting, monitoring and importance of exercise for BS and weight loss.  Goals:  Follow Diabetes Meal Plan as instructed  Work out 5 times per week for 150 mins a week.  Lose 10 lbs in three months.  Try to cook healthier meals.   Try to buy groceries at [employer] to save on grocery budge and afford more fresh fruits and vegetables.  Aim for A1C of 7.2%. In three months.  If having night sweats or symptoms of low blood sugars, test and if less than 70 treat low blood sugars. Notify Dr. Moshe Cipro if having low blood sugar symptoms--like night sweats etc. So she can consider stopping Glipizide  Teaching Method Utilized:   Visual Auditory Hands on  Handouts given during visit include: The Plate Method Carb Counting and Food Label handouts Meal Plan Card   Barriers to learning/adherence to lifestyle change: none  Demonstrated degree of understanding via:  Teach Back   Monitoring/Evaluation:  Dietary intake, exercise, meal planning, and body weight in 3 month(s).

## 2015-02-07 ENCOUNTER — Encounter: Payer: Self-pay | Admitting: Family Medicine

## 2015-02-07 ENCOUNTER — Ambulatory Visit (INDEPENDENT_AMBULATORY_CARE_PROVIDER_SITE_OTHER): Payer: BLUE CROSS/BLUE SHIELD | Admitting: Family Medicine

## 2015-02-07 VITALS — BP 104/72 | HR 88 | Resp 18 | Ht 65.0 in | Wt 227.0 lb

## 2015-02-07 DIAGNOSIS — F319 Bipolar disorder, unspecified: Secondary | ICD-10-CM

## 2015-02-07 DIAGNOSIS — J302 Other seasonal allergic rhinitis: Secondary | ICD-10-CM | POA: Diagnosis not present

## 2015-02-07 DIAGNOSIS — R809 Proteinuria, unspecified: Secondary | ICD-10-CM

## 2015-02-07 DIAGNOSIS — I10 Essential (primary) hypertension: Secondary | ICD-10-CM

## 2015-02-07 DIAGNOSIS — IMO0001 Reserved for inherently not codable concepts without codable children: Secondary | ICD-10-CM

## 2015-02-07 DIAGNOSIS — E1165 Type 2 diabetes mellitus with hyperglycemia: Secondary | ICD-10-CM

## 2015-02-07 NOTE — Patient Instructions (Signed)
F/u in 3 months, call if you need me before  CONGRATS on improved blood sugar   It is important that you exercise regularly at least 30 minutes7 times a week. If you develop chest pain, have severe difficulty breathing, or feel very tired, stop exercising immediately and seek medical attention    Stop losartan until contraception is back in place    HBA1C, chem 7 and EGFR

## 2015-02-08 ENCOUNTER — Encounter: Payer: Self-pay | Admitting: Family Medicine

## 2015-02-08 LAB — HEPATIC FUNCTION PANEL
ALT: 11 U/L (ref 0–35)
AST: 13 U/L (ref 0–37)
Albumin: 4.1 g/dL (ref 3.5–5.2)
Alkaline Phosphatase: 60 U/L (ref 39–117)
Bilirubin, Direct: 0.1 mg/dL (ref 0.0–0.3)
Indirect Bilirubin: 0.3 mg/dL (ref 0.2–1.2)
Total Bilirubin: 0.4 mg/dL (ref 0.2–1.2)
Total Protein: 6.9 g/dL (ref 6.0–8.3)

## 2015-02-08 LAB — LIPID PANEL
Cholesterol: 134 mg/dL (ref 0–200)
HDL: 55 mg/dL (ref >=46)
LDL Cholesterol: 60 mg/dL (ref 0–99)
Total CHOL/HDL Ratio: 2.4 Ratio
Triglycerides: 96 mg/dL (ref <150)
VLDL: 19 mg/dL (ref 0–40)

## 2015-02-08 LAB — TSH: TSH: 0.969 u[IU]/mL (ref 0.350–4.500)

## 2015-02-10 NOTE — Assessment & Plan Note (Signed)
Improved. No med change [name] is reminded of the importance of commitment to daily physical activity for 30 minutes or more, as able and the need to limit carbohydrate intake to 30 to 60 grams per meal to help with blood sugar control.   The need to take medication as prescribed, test blood sugar as directed, and to call between visits if there is a concern that blood sugar is uncontrolled is also discussed.   [name] is reminded of the importance of daily foot exam, annual eye examination, and good blood sugar, blood pressure and cholesterol control.  Diabetic Labs Latest Ref Rng 02/02/2015 11/12/2014 09/19/2014 08/06/2014 04/17/2014  HbA1c <5.7 % 7.6(H) 8.0(H) - 8.5(H) 7.7(H)  Microalbumin <2.0 mg/dL - - 8.3(H) - -  Micro/Creat Ratio 0.0 - 30.0 mg/g - - 40.7(H) - -  Chol 0 - 200 mg/dL 134 - - - -  HDL >=46 mg/dL 55 - - - -  Calc LDL 0 - 99 mg/dL 60 - - - -  Triglycerides <150 mg/dL 96 - - - -  Creatinine 0.50 - 1.10 mg/dL 0.82 0.82 - 0.85 0.93   BP/Weight 02/07/2015 02/06/2015 11/14/2014 10/19/2014 09/19/2014 04/05/2670 09/07/5807  Systolic BP 983 - 382 - 505 397 673  Diastolic BP 72 - 84 - 82 80 82  Wt. (Lbs) 227 225 224 224.6 231 231.12 231  BMI 37.77 37.44 37.28 37.38 38.44 38.46 38.44  Some encounter information is confidential and restricted. Go to Review Flowsheets activity to see all data.   Foot/eye exam completion dates 09/03/2014 08/16/2013  Foot Form Completion Done Done   Updated lab needed at/ before next visit.

## 2015-02-10 NOTE — Assessment & Plan Note (Signed)
Unchanged Patient re-educated about  the importance of commitment to a  minimum of 150 minutes of exercise per week.  The importance of healthy food choices with portion control discussed. Encouraged to start a food diary, count calories and to consider  joining a support group. Sample diet sheets offered. Goals set by the patient for the next several months.   Weight /BMI 02/07/2015 02/06/2015 11/14/2014  WEIGHT 227 lb 225 lb 224 lb  HEIGHT 5\' 5"  5\' 5"  5\' 5"   BMI 37.77 kg/m2 37.44 kg/m2 37.28 kg/m2  Some encounter information is confidential and restricted. Go to Review Flowsheets activity to see all data.    Current exercise per week 60 minutes.Plans to and needs to increase this

## 2015-02-10 NOTE — Assessment & Plan Note (Signed)
Controlled, no change in medication DASH diet and commitment to daily physical activity for a minimum of 30 minutes discussed and encouraged, as a part of hypertension management. The importance of attaining a healthy weight is also discussed.  BP/Weight 02/07/2015 02/06/2015 11/14/2014 10/19/2014 09/19/2014 8/0/9983 10/09/2503  Systolic BP 397 - 673 - 419 379 024  Diastolic BP 72 - 84 - 82 80 82  Wt. (Lbs) 227 225 224 224.6 231 231.12 231  BMI 37.77 37.44 37.28 37.38 38.44 38.46 38.44  Some encounter information is confidential and restricted. Go to Review Flowsheets activity to see all data.

## 2015-02-10 NOTE — Assessment & Plan Note (Signed)
Controlled and treated by psych

## 2015-02-10 NOTE — Progress Notes (Signed)
RASHEL OKEEFE     MRN: [id number]      DOB: [birth year]   HPI Ms. Pietsch is here for follow up and re-evaluation of chronic medical conditions, medication management and review of any available recent lab and radiology data.  Preventive health is updated, specifically  Cancer screening and Immunization.   Questions or concerns regarding consultations or procedures which the PT has had in the interim are  Addressed.Reports good benefit from diabetic educator The PT will remove her implant for contraception in am, due to excess bleeding , she is to stop losartan until contraception is again in place Denies polyuria, polydipsia, blurred vision , or hypoglycemic episodes. Needs to be more consistent with exercise There are no new concerns.  There are no specific complaints   ROS Denies recent fever or chills. Denies sinus pressure, nasal congestion, ear pain or sore throat. Denies chest congestion, productive cough or wheezing. Denies chest pains, palpitations and leg swelling Denies abdominal pain, nausea, vomiting,diarrhea or constipation.   Denies dysuria, frequency, hesitancy or incontinence. Denies joint pain, swelling and limitation in mobility. Denies headaches, seizures, numbness, or tingling. Denies depression, anxiety or insomnia. Denies skin break down or rash.   PE  BP 104/72 mmHg  Pulse 88  Resp 18  Ht 5\' 5"  (1.651 m)  Wt 227 lb (102.967 kg)  BMI 37.77 kg/m2  SpO2 99%  Patient alert and oriented and in no cardiopulmonary distress.  HEENT: No facial asymmetry, EOMI,   oropharynx pink and moist.  Neck supple no JVD, no mass.  Chest: Clear to auscultation bilaterally.  CVS: S1, S2 no murmurs, no S3.Regular rate.  ABD: Soft non tender.   Ext: No edema  MS: Adequate ROM spine, shoulders, hips and knees.  Skin: Intact, no ulcerations or rash noted.  Psych: Good eye contact, normal affect. Memory intact not anxious or depressed appearing.  CNS: CN 2-12 intact,  power,  normal throughout.no focal deficits noted.   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 No med change Ms. Bora is reminded of the importance of commitment to daily physical activity for 30 minutes or more, as able and the need to limit carbohydrate intake to 30 to 60 grams per meal to help with blood sugar control.   The need to take medication as prescribed, test blood sugar as directed, and to call between visits if there is a concern that blood sugar is uncontrolled is also discussed.   Ms. Hargan is reminded of the importance of daily foot exam, annual eye examination, and good blood sugar, blood pressure and cholesterol control.  Diabetic Labs Latest Ref Rng 02/02/2015 11/12/2014 09/19/2014 08/06/2014 04/17/2014  HbA1c <5.7 % 7.6(H) 8.0(H) - 8.5(H) 7.7(H)  Microalbumin <2.0 mg/dL - - 8.3(H) - -  Micro/Creat Ratio 0.0 - 30.0 mg/g - - 40.7(H) - -  Chol 0 - 200 mg/dL 134 - - - -  HDL >=46 mg/dL 55 - - - -  Calc LDL 0 - 99 mg/dL 60 - - - -  Triglycerides <150 mg/dL 96 - - - -  Creatinine 0.50 - 1.10 mg/dL 0.82 0.82 - 0.85 0.93   BP/Weight 02/07/2015 02/06/2015 11/14/2014 10/19/2014 09/19/2014 01/08/1244 8/0/9983  Systolic BP 382 - 505 - 397 673 419  Diastolic BP 72 - 84 - 82 80 82  Wt. (Lbs) 227 225 224 224.6 231 231.12 231  BMI 37.77 37.44 37.28 37.38 38.44 38.46 38.44  Some encounter information is confidential and  restricted. Go to Review Flowsheets activity to see all data.   Foot/eye exam completion dates 09/03/2014 08/16/2013  Foot Form Completion Done Done   Updated lab needed at/ before next visit.       Morbid obesity Unchanged Patient re-educated about  the importance of commitment to a  minimum of 150 minutes of exercise per week.  The importance of healthy food choices with portion control discussed. Encouraged to start a food diary, count calories and to consider  joining a support group. Sample diet sheets  offered. Goals set by the patient for the next several months.   Weight /BMI 02/07/2015 02/06/2015 11/14/2014  WEIGHT 227 lb 225 lb 224 lb  HEIGHT 5\' 5"  5\' 5"  5\' 5"   BMI 37.77 kg/m2 37.44 kg/m2 37.28 kg/m2  Some encounter information is confidential and restricted. Go to Review Flowsheets activity to see all data.    Current exercise per week 60 minutes.Plans to and needs to increase this   Essential hypertension, benign Controlled, no change in medication DASH diet and commitment to daily physical activity for a minimum of 30 minutes discussed and encouraged, as a part of hypertension management. The importance of attaining a healthy weight is also discussed.  BP/Weight 02/07/2015 02/06/2015 11/14/2014 10/19/2014 09/19/2014 09/11/6379 02/07/1164  Systolic BP 790 - 383 - 338 329 191  Diastolic BP 72 - 84 - 82 80 82  Wt. (Lbs) 227 225 224 224.6 231 231.12 231  BMI 37.77 37.44 37.28 37.38 38.44 38.46 38.44  Some encounter information is confidential and restricted. Go to Review Flowsheets activity to see all data.        Seasonal allergies Still uncontrolled, under the care of allergist for this, re testing needed  Bipolar 1 disorder Controlled and treated by psych

## 2015-02-10 NOTE — Assessment & Plan Note (Signed)
Still uncontrolled, under the care of allergist for this, re testing needed

## 2015-02-21 ENCOUNTER — Ambulatory Visit (INDEPENDENT_AMBULATORY_CARE_PROVIDER_SITE_OTHER): Payer: BLUE CROSS/BLUE SHIELD | Admitting: Gastroenterology

## 2015-02-21 ENCOUNTER — Encounter: Payer: Self-pay | Admitting: Gastroenterology

## 2015-02-21 VITALS — BP 117/76 | HR 84 | Temp 98.9°F | Ht 65.0 in | Wt 230.0 lb

## 2015-02-21 DIAGNOSIS — K219 Gastro-esophageal reflux disease without esophagitis: Secondary | ICD-10-CM | POA: Diagnosis not present

## 2015-02-21 MED ORDER — OMEPRAZOLE 20 MG PO CPDR: 20.0000 mg | DELAYED_RELEASE_CAPSULE | Freq: Every day | ORAL | Status: DC

## 2015-02-21 MED ORDER — ONDANSETRON HCL 4 MG PO TABS: 4.0000 mg | ORAL_TABLET | Freq: Three times a day (TID) | ORAL | Status: DC | PRN

## 2015-02-21 NOTE — Progress Notes (Signed)
Referring Provider: [name], MD Primary Care Physician:  Tula Nakayama, MD  Primary GI: Dr. Oneida Alar   Chief Complaint  Patient presents with  . Follow-up    doing well    HPI:   Christina [name] is a 39 y.o. female presenting today with a history of loose stools last year but stool studies negative. After changing water from well to bottled water, her symptoms resolved. Rectal bleeding noted during acute illness but resolved after diarrhea. Colonoscopy was recommended due to rectal bleeding: she did not complete this. Declining further evaluation stating she believes it was secondary to acute illness.  Denies any further rectal bleeding. No loose stools, denies constipation. History of gastroparesis but no nausea or vomiting for past 8 months. Prilosec once daily, controls symptoms. No abdominal pain. No dysphagia.   Past Medical History  Diagnosis Date  . GERD (gastroesophageal reflux disease)   . Bipolar disorder   . Gastroparesis   . 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 Hypertension     chronic on meds  . Diabetes mellitus     type 2 DM, insulin only needed during pregnancy  . Chronic kidney disease     proteinuria    Past Surgical History  Procedure Laterality Date  . Breast reduction surgery  1994  . Dermoid tumor  2000  . Esophagogastroduodenoscopy  07/01/2009    DYJ:WLKHVF esphagus without barrett's/dilation with 16 mm/mild erthyema in the antrum. mild chronic gastritis on path.     Current Outpatient Prescriptions  Medication Sig Dispense Refill  . amLODipine (NORVASC) 10 MG tablet Take 10 mg by mouth daily.    . ARIPiprazole (ABILIFY) 5 MG tablet Take 1 tablet (5 mg total) by mouth daily. 30 tablet 2  . dapagliflozin propanediol (FARXIGA) 10 MG TABS tablet Take 10 mg by mouth daily. 30 tablet 4  . glipiZIDE (GLUCOTROL XL) 10 MG 24 hr tablet Two tablets once daily 60 tablet 5  . metFORMIN (GLUCOPHAGE) 1000 MG tablet Take 1 tablet (1,000 mg  total) by mouth 2 (two) times daily with a meal. 60 tablet 5  . montelukast (SINGULAIR) 10 MG tablet Take 1 tablet (10 mg total) by mouth at bedtime. 30 tablet 3  . omeprazole (PRILOSEC) 20 MG capsule Take 20 mg by mouth daily.    . ondansetron (ZOFRAN) 4 MG tablet Take 4 mg by mouth every 8 (eight) hours as needed for nausea or vomiting.    . triamterene-hydrochlorothiazide (MAXZIDE-25) 37.5-25 MG per tablet One and a half tablets once daily 45 tablet 5  . Blood Glucose Monitoring Suppl (ACCU-CHEK AVIVA PLUS) W/DEVICE KIT 1 kit to test blood sugars daily dx 250.00 1 kit 0   No current facility-administered medications for this visit.    Allergies as of 02/21/2015 - Review Complete 02/21/2015  Allergen Reaction Noted  . 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 Peanut-containing drug products Anaphylaxis 02/08/2013  . Ace inhibitors Cough 10/10/2010  . Amoxicillin-pot clavulanate Nausea And Vomiting 04/07/2013  . Invokana [canagliflozin] Other (See Comments) 04/18/2014  . Other  06/13/2013    Family History  Problem Relation Age of Onset  . Diabetes Mother   . Hypertension Mother   . Fibromyalgia Mother   . Diabetes Father   . Hypertension Father   . Asthma Father   . Colon cancer Neg Hx     History   Social History  . Marital Status: Married    Spouse Name: N/A  .  Number of Children: N/A  . Years of Education: N/A   Social History Main Topics  . Smoking status: Never Smoker   . Smokeless tobacco: Never Used  . Alcohol Use: No  . Drug Use: No  . Sexual Activity:    Partners: Male    Birth Control/ Protection: Rhythm     Comment: Last Intercourse about 10 days ago   Other Topics Concern  . None   Social History Narrative    Review of Systems: As mentioned in HPI  Physical Exam: BP 117/76 mmHg  Pulse 84  Temp(Src) 98.9 F (37.2 C) (Oral)  Ht 5' 5"  (1.651 m)  Wt 230 lb (104.327 kg)  BMI 38.27 kg/m2  LMP 02/02/2015 General:   Alert and oriented. No distress  noted. Pleasant and cooperative.  Head:  Normocephalic and atraumatic. Eyes:  Conjuctiva clear without scleral icterus. Heart:  S1, S2 present without murmurs, rubs, or gallops. Regular rate and rhythm. Abdomen:  +BS, soft, non-tender and non-distended. No rebound or guarding. No HSM or masses noted. Query umbilical hernia, small.  Msk:  Symmetrical without gross deformities. Normal posture. Extremities:  Without edema. Neurologic:  Alert and  oriented x4;  grossly normal neurologically. Psych:  Alert and cooperative. Normal mood and affect.

## 2015-02-21 NOTE — Progress Notes (Signed)
CC'ED TO PCP 

## 2015-02-21 NOTE — Patient Instructions (Signed)
I have refilled your Prilosec and Zofran. Continue Prilosec once daily.   We will see you back in 1 year!

## 2015-02-21 NOTE — Assessment & Plan Note (Signed)
Controlled well with Prilosec once daily. History of gastroparesis but quiescent at this time. One episode of rectal bleeding last year during diarrheal illness but resolved. Colonoscopy offered but declined last year. No further issues. Proceed with lower GI evaluation if any further rectal bleeding. Return in 1 year.

## 2015-03-14 ENCOUNTER — Other Ambulatory Visit: Payer: Self-pay | Admitting: Family Medicine

## 2015-03-14 ENCOUNTER — Telehealth: Payer: Self-pay | Admitting: *Deleted

## 2015-03-14 NOTE — Telephone Encounter (Signed)
Patient contacted the office stating she believes she maybe pregnant. Patient states she had her Nexplanon removed on 02-08-15 by Dr. Delsa Sale in [place]. Patient states she and her husband have unprotected intercourse since then. Patient states she has not had a cycle. Patient states she is concerned about being pregnant due to her age and the fact that she is diabetic. Patient states she has taken two pregnancy test at home but hasn't gotten clear results. Patient has been scheduled for pregnancy in office on 03/15/15 @ 9 am.

## 2015-04-29 ENCOUNTER — Encounter: Payer: Self-pay | Admitting: Family Medicine

## 2015-04-29 ENCOUNTER — Ambulatory Visit (INDEPENDENT_AMBULATORY_CARE_PROVIDER_SITE_OTHER): Payer: BLUE CROSS/BLUE SHIELD | Admitting: Family Medicine

## 2015-04-29 VITALS — BP 118/82 | HR 97 | Temp 99.1°F | Resp 16 | Wt 235.0 lb

## 2015-04-29 DIAGNOSIS — E1165 Type 2 diabetes mellitus with hyperglycemia: Secondary | ICD-10-CM | POA: Diagnosis not present

## 2015-04-29 DIAGNOSIS — R809 Proteinuria, unspecified: Secondary | ICD-10-CM

## 2015-04-29 DIAGNOSIS — I10 Essential (primary) hypertension: Secondary | ICD-10-CM

## 2015-04-29 DIAGNOSIS — IMO0001 Reserved for inherently not codable concepts without codable children: Secondary | ICD-10-CM

## 2015-04-29 DIAGNOSIS — J0100 Acute maxillary sinusitis, unspecified: Secondary | ICD-10-CM

## 2015-04-29 DIAGNOSIS — J209 Acute bronchitis, unspecified: Secondary | ICD-10-CM

## 2015-04-29 MED ORDER — LEVOFLOXACIN 500 MG PO TABS: 500.0000 mg | ORAL_TABLET | Freq: Every day | ORAL | Status: DC

## 2015-04-29 MED ORDER — FLUCONAZOLE 150 MG PO TABS: 150.0000 mg | ORAL_TABLET | Freq: Once | ORAL | Status: DC

## 2015-04-29 MED ORDER — PROMETHAZINE-DM 6.25-15 MG/5ML PO SYRP: 5.0000 mL | ORAL_SOLUTION | Freq: Every evening | ORAL | Status: DC | PRN

## 2015-04-29 NOTE — Patient Instructions (Addendum)
F/u as before, cal, if you need me sooner  You are treated for acute sinusitis and bronchitis  Work excuse from today to return on 05/02/2015  Stop augmentin, continue tessalon perles and albuterol inhaler as needed  Start Levaquin 500 mg one daily for 10 days. Use sudafed one daily for 3 to 5 days to help reduce facial and ear pressure Saline nasal flushes and a lot of water New is cough suppressant syrup for bedtime use only also  Hope you feel better soon  Sinusitis Sinusitis is redness, soreness, and puffiness (inflammation) of the air pockets in the bones of your face (sinuses). The redness, soreness, and puffiness can cause air and mucus to get trapped in your sinuses. This can allow germs to grow and cause an infection.  HOME CARE   Drink enough fluids to keep your pee (urine) clear or pale yellow.  Use a humidifier in your home.  Run a hot shower to create steam in the bathroom. Sit in the bathroom with the door closed. Breathe in the steam 3-4 times a day.  Put a warm, moist washcloth on your face 3-4 times a day, or as told by your doctor.  Use salt water sprays (saline sprays) to wet the thick fluid in your nose. This can help the sinuses drain.  Only take medicine as told by your doctor. GET HELP RIGHT AWAY IF:   Your pain gets worse.  You have very bad headaches.  You are sick to your stomach (nauseous).  You throw up (vomit).  You are very sleepy (drowsy) all the time.  Your face is puffy (swollen).  Your vision changes.  You have a stiff neck.  You have trouble breathing. MAKE SURE YOU:   Understand these instructions.  Will watch your condition.  Will get help right away if you are not doing well or get worse. Document Released: 01/06/2008 Document Revised: 04/13/2012 Document Reviewed: 02/23/2012 [facility] Patient Information 2015 [place], [place]. This information is not intended to replace advice given to you by your health care provider. Make  sure you discuss any questions you have with your health care provider. Acute Bronchitis Bronchitis is when the airways that extend from the windpipe into the lungs get red, puffy, and painful (inflamed). Bronchitis often causes thick spit (mucus) to develop. This leads to a cough. A cough is the most common symptom of bronchitis. In acute bronchitis, the condition usually begins suddenly and goes away over time (usually in 2 weeks). Smoking, allergies, and asthma can make bronchitis worse. Repeated episodes of bronchitis may cause more lung problems. HOME CARE  Rest.  Drink enough fluids to keep your pee (urine) clear or pale yellow (unless you need to limit fluids as told by your doctor).  Only take over-the-counter or prescription medicines as told by your doctor.  Avoid smoking and secondhand smoke. These can make bronchitis worse. If you are a smoker, think about using nicotine gum or skin patches. Quitting smoking will help your lungs heal faster.  Reduce the chance of getting bronchitis again by:  Washing your hands often.  Avoiding people with cold symptoms.  Trying not to touch your hands to your mouth, nose, or eyes.  Follow up with your doctor as told. GET HELP IF: Your symptoms do not improve after 1 week of treatment. Symptoms include:  Cough.  Fever.  Coughing up thick spit.  Body aches.  Chest congestion.  Chills.  Shortness of breath.  Sore throat. GET HELP RIGHT AWAY  IF:   You have an increased fever.  You have chills.  You have severe shortness of breath.  You have bloody thick spit (sputum).  You throw up (vomit) often.  You lose too much body fluid (dehydration).  You have a severe headache.  You faint. MAKE SURE YOU:   Understand these instructions.  Will watch your condition.  Will get help right away if you are not doing well or get worse. Document Released: 01/06/2008 Document Revised: 03/22/2013 Document Reviewed:  01/10/2013 [facility] Patient Information 2015 [place], [place]. This information is not intended to replace advice given to you by your health care provider. Make sure you discuss any questions you have with your health care provider.

## 2015-04-29 NOTE — Progress Notes (Signed)
Subjective:    Patient ID: Christina [name], female    DOB: 03-07-1976, 39 y.o.   MRN: [id number]  HPI 3 day h/o worsening head and chest congestion, associated with fever and chills intermittently. Nasal drainage has thickened , and is yellowish green, and at times bloody. Sputum is thick and yellow. C/o bilateral ear pressure, denies hearing loss and sore throat. Increasing fatigue , poor appetitie and sleep disturbed by cough. No improvement with OTC medication. Denies polyuria, polydipsia, blurred vision , or hypoglycemic episodes. States blood sugars have not been as good as she would like , often overwhelmed with all of her responsibilities    Review of Systems See HPI Denies chest pains, palpitations and leg swelling Denies abdominal pain, nausea, vomiting,diarrhea or constipation.   Denies dysuria, frequency, hesitancy or incontinence. Denies joint pain, swelling and limitation in mobility. Denies headaches, seizures, numbness, or tingling. Denies uncontrolled  Depression or anxiety Denies skin break down or rash.         Objective:   Physical Exam BP 118/82 mmHg  Pulse 97  Temp(Src) 99.1 F (37.3 C) (Oral)  Resp 16  Wt 235 lb (106.595 kg)  SpO2 96% Patient alert and oriented and in no cardiopulmonary distress.Ill appearing  HEENT: No facial asymmetry, EOMI,   oropharynx pink and moist.  Neck supple no JVD, bilateral anterior tender cervical adenitis. Bilateral maxillary sinus tenderness  Chest: Decreased though adequate air entry, bilateral crackles in bases, few  wheezes  CVS: S1, S2 no murmurs, no S3.Regular rate.  ABD: Soft non tender.   Ext: No edema  MS: Adequate ROM spine, shoulders, hips and knees.  Skin: Intact, no ulcerations or rash noted.  Psych: Good eye contact, normal affect. Memory intact not anxious or depressed appearing.  CNS: CN 2-12 intact, power,  normal throughout.no focal deficits noted.        Assessment & Plan:  Acute  maxillary sinusitis Changed antibiotic to levaquin, due to PCN allergy  Acute bronchitis Decongestant, antibiotic and proventil inhaler as needed. 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 is reminded of the importance of commitment to daily physical activity for 30 minutes or more, as able and the need to limit carbohydrate intake to 30 to 60 grams per meal to help with blood sugar control.   The need to take medication as prescribed, test blood sugar as directed, and to call between visits if there is a concern that blood sugar is uncontrolled is also discussed.   [name] is reminded of the importance of daily foot exam, annual eye examination, and good blood sugar, blood pressure and cholesterol control. Updated lab needed at/ before next visit.   Diabetic Labs Latest Ref Rng 02/02/2015 11/12/2014 09/19/2014 08/06/2014 04/17/2014  HbA1c <5.7 % 7.6(H) 8.0(H) - 8.5(H) 7.7(H)  Microalbumin <2.0 mg/dL - - 8.3(H) - -  Micro/Creat Ratio 0.0 - 30.0 mg/g - - 40.7(H) - -  Chol 0 - 200 mg/dL 134 - - - -  HDL >=46 mg/dL 55 - - - -  Calc LDL 0 - 99 mg/dL 60 - - - -  Triglycerides <150 mg/dL 96 - - - -  Creatinine 0.50 - 1.10 mg/dL 0.82 0.82 - 0.85 0.93   BP/Weight 04/29/2015 02/21/2015 02/07/2015 02/06/2015 11/14/2014 10/19/2014 04/18/3845  Systolic BP 659 935 701 - 779 - 390  Diastolic BP 82 76 72 - 84 - 82  Wt. (Lbs) 235 230 227 225 224 224.6 231  BMI 39.11 38.27 37.77 37.44 37.28 37.38 38.44  Some encounter information is confidential and restricted. Go to Review Flowsheets activity to see all data.   Foot/eye exam completion dates 09/03/2014 08/16/2013  Foot Form Completion Done Done         Essential hypertension, benign Controlled, no change in medication

## 2015-05-03 DIAGNOSIS — J209 Acute bronchitis, unspecified: Secondary | ICD-10-CM | POA: Insufficient documentation

## 2015-05-03 DIAGNOSIS — J0100 Acute maxillary sinusitis, unspecified: Secondary | ICD-10-CM | POA: Insufficient documentation

## 2015-05-03 NOTE — Assessment & Plan Note (Signed)
Decongestant, antibiotic and proventil inhaler as needed. Work excuse for 2 days

## 2015-05-03 NOTE — Assessment & Plan Note (Signed)
[name] is reminded of the importance of commitment to daily physical activity for 30 minutes or more, as able and the need to limit carbohydrate intake to 30 to 60 grams per meal to help with blood sugar control.   The need to take medication as prescribed, test blood sugar as directed, and to call between visits if there is a concern that blood sugar is uncontrolled is also discussed.   [name] is reminded of the importance of daily foot exam, annual eye examination, and good blood sugar, blood pressure and cholesterol control. Updated lab needed at/ before next visit.   Diabetic Labs Latest Ref Rng 02/02/2015 11/12/2014 09/19/2014 08/06/2014 04/17/2014  HbA1c <5.7 % 7.6(H) 8.0(H) - 8.5(H) 7.7(H)  Microalbumin <2.0 mg/dL - - 8.3(H) - -  Micro/Creat Ratio 0.0 - 30.0 mg/g - - 40.7(H) - -  Chol 0 - 200 mg/dL 134 - - - -  HDL >=46 mg/dL 55 - - - -  Calc LDL 0 - 99 mg/dL 60 - - - -  Triglycerides <150 mg/dL 96 - - - -  Creatinine 0.50 - 1.10 mg/dL 0.82 0.82 - 0.85 0.93   BP/Weight 04/29/2015 02/21/2015 02/07/2015 02/06/2015 11/14/2014 10/19/2014 4/91/7915  Systolic BP 056 979 480 - 165 - 537  Diastolic BP 82 76 72 - 84 - 82  Wt. (Lbs) 235 230 227 225 224 224.6 231  BMI 39.11 38.27 37.77 37.44 37.28 37.38 38.44  Some encounter information is confidential and restricted. Go to Review Flowsheets activity to see all data.   Foot/eye exam completion dates 09/03/2014 08/16/2013  Foot Form Completion Done Done

## 2015-05-03 NOTE — Assessment & Plan Note (Signed)
Changed antibiotic to levaquin, due to PCN allergy

## 2015-05-03 NOTE — Assessment & Plan Note (Signed)
Controlled, no change in medication  

## 2015-05-06 ENCOUNTER — Ambulatory Visit (HOSPITAL_COMMUNITY): Payer: Self-pay | Admitting: Psychiatry

## 2015-05-08 ENCOUNTER — Other Ambulatory Visit (HOSPITAL_COMMUNITY): Payer: Self-pay | Admitting: Psychiatry

## 2015-05-08 DIAGNOSIS — F3162 Bipolar disorder, current episode mixed, moderate: Secondary | ICD-10-CM

## 2015-05-09 ENCOUNTER — Encounter: Payer: BLUE CROSS/BLUE SHIELD | Attending: Family Medicine | Admitting: Nutrition

## 2015-05-09 VITALS — Ht 65.0 in | Wt 232.8 lb

## 2015-05-09 DIAGNOSIS — E1165 Type 2 diabetes mellitus with hyperglycemia: Secondary | ICD-10-CM | POA: Diagnosis present

## 2015-05-09 DIAGNOSIS — IMO0002 Reserved for concepts with insufficient information to code with codable children: Secondary | ICD-10-CM

## 2015-05-09 DIAGNOSIS — Z6838 Body mass index (BMI) 38.0-38.9, adult: Secondary | ICD-10-CM | POA: Insufficient documentation

## 2015-05-09 DIAGNOSIS — Z713 Dietary counseling and surveillance: Secondary | ICD-10-CM | POA: Insufficient documentation

## 2015-05-09 DIAGNOSIS — E669 Obesity, unspecified: Secondary | ICD-10-CM | POA: Diagnosis not present

## 2015-05-09 DIAGNOSIS — E118 Type 2 diabetes mellitus with unspecified complications: Secondary | ICD-10-CM

## 2015-05-09 NOTE — Telephone Encounter (Signed)
Met with [name] who authorized a one time refill of patient's prescribed Abilify and new one time refill order e-scribed to patient's [facility] in [place] with instruction no further refills until evaluated.

## 2015-05-09 NOTE — Progress Notes (Signed)
  Medical Nutrition Therapy:  Appt start time: 0830 end time:  0900. Assessment:  Primary concerns today: Diabetes Type 2 and weight loss. Lost 3 lbs since last visit. Now working out at home 30 minutes to 60 mins. Changes: cooking own food now. Forgot meter.  She notes blood sugars are up and down. BS in am around 150's. She thinks the farxiga makes her blood sugar go up. Working on exercising more consistently now and cooking more of her meals instead of her husband who likes to cook with a lot of fat and cheese she notes. Taking Wilder Glade, Glipizipe and Metformin. Has been sick the few last days and hasn't been eating much. Was on an antibiotic. Diet needs more fresh fruits, whole grains and low carb vegetables.  Wt Readings from Last 3 Encounters:  05/09/15 232 lb 12.8 oz (105.597 kg)  04/29/15 235 lb (106.595 kg)  02/21/15 230 lb (104.327 kg)   Ht Readings from Last 3 Encounters:  05/09/15 5\' 5"  (1.651 m)  02/21/15 5\' 5"  (1.651 m)  02/07/15 5\' 5"  (1.651 m)   Body mass index is 38.74 kg/(m^2).  Lab Results  Component Value Date   HGBA1C 7.6* 02/02/2015   Lab Results  Component Value Date   CHOL 134 02/02/2015   HDL 55 02/02/2015   LDLCALC 60 02/02/2015   TRIG 96 02/02/2015   CHOLHDL 2.4 02/02/2015   Preferred Learning Style:   Auditory  Visual  Hands on  Learning Readiness:    Ready  Change in progress  MEDICATIONS: See list   DIETARY INTAKE:  24-hr recall:  B)  [place] roll and 1 pkt oatmeal , water  FBS 163 mg/dl  L) Chicken parmesean, pineapple, water. D) 2 slices of toast, wasn't feeling well  Beverages: water  Usual physical activity:Walks more and starting to play tennis. Estimated energy needs: 1500  calories 170 g carbohydrates 112 g protein 42 g fat  Progress Towards Goal(s):  In progress.   Nutritional Diagnosis:  NB-1.1 Food and nutrition-related knowledge deficit As related to Diabetes.  As evidenced by A1C >8%..    Intervention:   Nutrition and diabetes education provided on diet, meal planning,  Diabetes disease, complications, target ranges for BS, CHO Counting, monitoring and importance of exercise for BS and weight loss.  Goals:  Follow Diabetes Meal Plan as instructed  Work out 5 times per week for 150 mins a week.  Lose 5 lbs in 2 months.  Try to cook healthier meals.   Try to buy groceries at [employer] to save on grocery budge and afford more fresh fruits and vegetables.  Aim for A1C of 7.2%. In three months.  Carry meter with you at all times.  Add an egg and yogurt with oatmeal at breakfast.   Teaching Method Utilized:  Visual Auditory Hands on  Handouts given during visit include: The Plate Method Carb Counting and Food Label handouts Meal Plan Card   Barriers to learning/adherence to lifestyle change: none  Demonstrated degree of understanding via:  Teach Back   Monitoring/Evaluation:  Dietary intake, exercise, meal planning, and body weight in 3 month(s).

## 2015-05-09 NOTE — Patient Instructions (Signed)
Goals:  Follow Diabetes Meal Plan as instructed  Work out 5 times per week for 150 mins a week.  Lose 5 lbs in 2 months.  Try to cook healthier meals.   Try to buy groceries at [employer] to save on grocery budge and afford more fresh fruits and vegetables.  Aim for A1C of 7.2%. In three months.  Carry meter with you at all times.  Add an egg and yogurt with oatmeal at breakfast.

## 2015-05-21 ENCOUNTER — Encounter: Payer: Self-pay | Admitting: Family Medicine

## 2015-05-25 LAB — COMPLETE METABOLIC PANEL WITH GFR
ALT: 14 U/L (ref 6–29)
AST: 15 U/L (ref 10–30)
Albumin: 4.2 g/dL (ref 3.6–5.1)
Alkaline Phosphatase: 59 U/L (ref 33–115)
BUN: 9 mg/dL (ref 7–25)
CO2: 27 mmol/L (ref 20–31)
Calcium: 9.7 mg/dL (ref 8.6–10.2)
Chloride: 99 mmol/L (ref 98–110)
Creat: 0.83 mg/dL (ref 0.50–1.10)
GFR, Est African American: >89 mL/min (ref >=60)
GFR, Est Non African American: 89 mL/min (ref >=60)
Glucose, Bld: 128 mg/dL — ABNORMAL HIGH (ref 65–99)
Potassium: 3.6 mmol/L (ref 3.5–5.3)
Sodium: 137 mmol/L (ref 135–146)
Total Bilirubin: 0.5 mg/dL (ref 0.2–1.2)
Total Protein: 6.9 g/dL (ref 6.1–8.1)

## 2015-05-25 LAB — HEMOGLOBIN A1C
Hgb A1c MFr Bld: 7.8 % — ABNORMAL HIGH (ref <5.7)
Mean Plasma Glucose: 177 mg/dL — ABNORMAL HIGH (ref <117)

## 2015-05-28 ENCOUNTER — Encounter: Payer: Self-pay | Admitting: Family Medicine

## 2015-05-28 ENCOUNTER — Encounter (HOSPITAL_COMMUNITY): Payer: Self-pay | Admitting: Psychiatry

## 2015-05-28 ENCOUNTER — Ambulatory Visit (INDEPENDENT_AMBULATORY_CARE_PROVIDER_SITE_OTHER): Payer: BLUE CROSS/BLUE SHIELD | Admitting: Psychiatry

## 2015-05-28 ENCOUNTER — Ambulatory Visit (INDEPENDENT_AMBULATORY_CARE_PROVIDER_SITE_OTHER): Payer: BLUE CROSS/BLUE SHIELD | Admitting: Family Medicine

## 2015-05-28 VITALS — BP 122/82 | HR 101 | Resp 16 | Ht 65.0 in | Wt 233.0 lb

## 2015-05-28 VITALS — BP 130/87 | HR 101 | Ht 65.0 in | Wt 231.0 lb

## 2015-05-28 DIAGNOSIS — I10 Essential (primary) hypertension: Secondary | ICD-10-CM | POA: Diagnosis not present

## 2015-05-28 DIAGNOSIS — J302 Other seasonal allergic rhinitis: Secondary | ICD-10-CM | POA: Diagnosis not present

## 2015-05-28 DIAGNOSIS — F319 Bipolar disorder, unspecified: Secondary | ICD-10-CM

## 2015-05-28 DIAGNOSIS — E1165 Type 2 diabetes mellitus with hyperglycemia: Secondary | ICD-10-CM | POA: Diagnosis not present

## 2015-05-28 DIAGNOSIS — F3162 Bipolar disorder, current episode mixed, moderate: Secondary | ICD-10-CM

## 2015-05-28 DIAGNOSIS — IMO0001 Reserved for inherently not codable concepts without codable children: Secondary | ICD-10-CM

## 2015-05-28 DIAGNOSIS — Z23 Encounter for immunization: Secondary | ICD-10-CM | POA: Diagnosis not present

## 2015-05-28 DIAGNOSIS — R809 Proteinuria, unspecified: Secondary | ICD-10-CM

## 2015-05-28 DIAGNOSIS — K219 Gastro-esophageal reflux disease without esophagitis: Secondary | ICD-10-CM

## 2015-05-28 MED ORDER — ARIPIPRAZOLE 5 MG PO TABS: 5.0000 mg | ORAL_TABLET | Freq: Every day | ORAL | Status: DC

## 2015-05-28 MED ORDER — FLUCONAZOLE 150 MG PO TABS: ORAL_TABLET | ORAL | Status: DC

## 2015-05-28 MED ORDER — GLIPIZIDE ER 10 MG PO TB24: ORAL_TABLET | ORAL | Status: DC

## 2015-05-28 MED ORDER — METFORMIN HCL 1000 MG PO TABS: ORAL_TABLET | ORAL | Status: DC

## 2015-05-28 NOTE — Patient Instructions (Addendum)
F/u in 3.5 month, call if you need me before  Blood sugar needs to improve, pls continue to work on diet and exercise  HBA1C and chem 7 and EGFR  Goal of  7.4 or less in 3.5 month  Flu vaccine today  Please work on good  health habits so that your health will improve. 1. Commitment to daily physical activity for 30 to 60  minutes, if you are able to do this.  2. Commitment to wise food choices. Aim for half of your  food intake to be vegetable and fruit, one quarter starchy foods, and one quarter protein. Try to eat on a regular schedule  3 meals per day, snacking between meals should be limited to vegetables or fruits or small portions of nuts. 64 ounces of water per day is generally recommended, unless you have specific health conditions, like heart failure or kidney failure where you will need to limit fluid intake.  3. Commitment to sufficient and a  good quality of physical and mental rest daily, generally between 6 to 8 hours per day.  WITH PERSISTANCE AND PERSEVERANCE, THE IMPOSSIBLE , BECOMES THE NORM!   Thanks for choosing [facility], we consider it a privelige to serve you.

## 2015-05-28 NOTE — Progress Notes (Signed)
Subjective:    Patient ID: Christina [name], female    DOB: 12/01/1975, 39 y.o.   MRN: [id number]  HPI    Christina [name]     MRN: [id number]      DOB: [birth year]   HPI Christina [name] is here for follow up and re-evaluation of chronic medical conditions, medication management and review of any available recent lab and radiology data.  Preventive health is updated, specifically  Cancer screening and Immunization.   Questions or concerns regarding consultations or procedures which the PT has had in the interim are  addressed. The PT denies any adverse reactions to current medications since the last visit.  There are no new concerns.  There are no specific complaints  Denies polyuria, polydipsia, blurred vision , or hypoglycemic episodes.   ROS Denies recent fever or chills. Denies sinus pressure, nasal congestion, ear pain or sore throat. Denies chest congestion, productive cough or wheezing. Denies chest pains, palpitations and leg swelling Denies abdominal pain, nausea, vomiting,diarrhea or constipation.   Denies dysuria, frequency, hesitancy or incontinence. Denies joint pain, swelling and limitation in mobility. Denies headaches, seizures, numbness, or tingling. Denies depression, anxiety or insomnia. Denies skin break down or rash.   PE  BP 122/82 mmHg  Pulse 101  Resp 16  Ht 5\' 5"  (1.651 m)  Wt 233 lb (105.688 kg)  BMI 38.77 kg/m2  SpO2 97%  Patient alert and oriented and in no cardiopulmonary distress.  HEENT: No facial asymmetry, EOMI,   oropharynx pink and moist.  Neck supple no JVD, no mass.  Chest: Clear to auscultation bilaterally.  CVS: S1, S2 no murmurs, no S3.Regular rate.  ABD: Soft non tender.   Ext: No edema  MS: Adequate ROM spine, shoulders, hips and knees.  Skin: Intact, no ulcerations or rash noted.  Psych: Good eye contact, normal affect. Memory intact not anxious or depressed appearing.  CNS: CN 2-12 intact, power,  normal throughout.no focal  deficits noted.   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 Christina [name] is reminded of the importance of commitment to daily physical activity for 30 minutes or more, as able and the need to limit carbohydrate intake to 30 to 60 grams per meal to help with blood sugar control.   The need to take medication as prescribed, test blood sugar as directed, and to call between visits if there is a concern that blood sugar is uncontrolled is also discussed.   Christina [name] is reminded of the importance of daily foot exam, annual eye examination, and good blood sugar, blood pressure and cholesterol control.  Diabetic Labs Latest Ref Rng 05/24/2015 02/02/2015 11/12/2014 09/19/2014 08/06/2014  HbA1c <5.7 % 7.8(H) 7.6(H) 8.0(H) - 8.5(H)  Microalbumin <2.0 mg/dL - - - 8.3(H) -  Micro/Creat Ratio 0.0 - 30.0 mg/g - - - 40.7(H) -  Chol 0 - 200 mg/dL - 134 - - -  HDL >=46 mg/dL - 55 - - -  Calc LDL 0 - 99 mg/dL - 60 - - -  Triglycerides <150 mg/dL - 96 - - -  Creatinine 0.50 - 1.10 mg/dL 0.83 0.82 0.82 - 0.85   BP/Weight 05/28/2015 05/09/2015 04/29/2015 02/21/2015 02/07/2015 02/06/2015 3/66/2947  Systolic BP 654 - 650 354 656 - 812  Diastolic BP 82 - 82 76 72 - 84  Wt. (Lbs) 233 232.8 235 230 227 225 224  BMI  38.77 38.74 39.11 38.27 37.77 37.44 37.28  Some encounter information is confidential and restricted. Go to Review Flowsheets activity to see all data.   Foot/eye exam completion dates 09/03/2014 08/16/2013  Foot Form Completion Done Done         Essential hypertension, benign Controlled, no change in medication DASH diet and commitment to daily physical activity for a minimum of 30 minutes discussed and encouraged, as a part of hypertension management. The importance of attaining a healthy weight is also discussed.  BP/Weight 05/28/2015 05/09/2015 04/29/2015 02/21/2015 02/07/2015  02/06/2015 1/61/0960  Systolic BP 454 - 098 119 147 - 829  Diastolic BP 82 - 82 76 72 - 84  Wt. (Lbs) 233 232.8 235 230 227 225 224  BMI 38.77 38.74 39.11 38.27 37.77 37.44 37.28  Some encounter information is confidential and restricted. Go to Review Flowsheets activity to see all data.        Seasonal allergies Controlled, no change in medication   Morbid obesity Unchanged. Patient re-educated about  the importance of commitment to a  minimum of 150 minutes of exercise per week.  The importance of healthy food choices with portion control discussed. Encouraged to start a food diary, count calories and to consider  joining a support group. Sample diet sheets offered. Goals set by the patient for the next several months.   Weight /BMI 05/28/2015 05/09/2015 04/29/2015  WEIGHT 233 lb 232 lb 12.8 oz 235 lb  HEIGHT 5\' 5"  5\' 5"  -  BMI 38.77 kg/m2 38.74 kg/m2 39.11 kg/m2  Some encounter information is confidential and restricted. Go to Review Flowsheets activity to see all data.    Current exercise per week 90 minutes.   GERD Controlled, no change in medication   Bipolar 1 disorder Managed by psych, stable and doing well at home and on the job      Review of Systems     Objective:   Physical Exam        Assessment & Plan:

## 2015-05-28 NOTE — Progress Notes (Signed)
[facility] Progress Note  Christina [name] [id number] 39 y.o.  05/28/2015 3:38 PM  Chief Complaint:  Medication management and followup.     History of Present Illness:  Christina [name] came for her appointment.  She is doing better on her current psychiatric medication.  She is taking Abilify 5 mg and denies any side effects.  She reported her mood is much stable and she denies any irritability, anger, mania, paranoia or any hallucinations.  Today she had a visit with her primary care physician and her hemoglobin A1c was high.  She admitted taking Christina [name] waits which may have cause increased blood sugar.  Overall her mood is better.  She is getting along with her mother-in-law.  She likes her job.  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  She does not feel a lot of pressure from her job.  Patient denies any feeling of hopelessness or worthlessness.  She denies any crying spells.  Her energy level is good.  Her appetite is okay.  Her vitals are stable.  Patient lives with her husband who is very supportive.  Suicidal Ideation: No Plan Formed: No Patient has means to carry out plan: No  Homicidal Ideation: No Plan Formed: No Patient has means to carry out plan: No  Medical History; Her primary care physician is Dr. Tula Nakayama.  Patient has history of diabetes , hypertension, gastroparesis, goiter, proteinuria and GERD.  Past Psychiatric History/Hospitalization(s) Patient has been seeing in this office since 2006. She was referred from her primary care physician Dr. Moshe Cipro. She has history of depression and mania and psychosis. In 1997 she was admitted at [facility] because of suicidal gesture.  In the past she has taken Prozac and Wellbutrin.  She denies any history of sexual, verbal emotional abuse.   Anxiety: Yes Bipolar Disorder: Yes Depression: Yes Mania: Yes Psychosis: Yes Schizophrenia: No Personality Disorder:  No Hospitalization for psychiatric illness: Yes History of Electroconvulsive Shock Therapy: No Prior Suicide Attempts: No   Review of Systems  Skin: Negative for itching and rash.  Neurological: Negative.   Psychiatric/Behavioral: Negative for depression, suicidal ideas, hallucinations and substance abuse. The patient is not nervous/anxious and does not have insomnia.    Psychiatric: Agitation: No Hallucination: No Depressed Mood: No Insomnia: No Hypersomnia: No Altered Concentration: No Feels Worthless: No Grandiose Ideas: No Belief In Special Powers: No New/Increased Substance Abuse: No Compulsions: No  Neurologic: Headache: No Seizure: No Paresthesias: No    Outpatient Encounter Prescriptions as of 05/28/2015  Medication Sig  . albuterol (PROVENTIL HFA;VENTOLIN HFA) 108 (90 BASE) MCG/ACT inhaler Inhale 2 puffs into the lungs every 4 (four) hours as needed for wheezing or shortness of breath.  Marland Kitchen amLODipine (NORVASC) 10 MG tablet Take 10 mg by mouth daily.  . ARIPiprazole (ABILIFY) 5 MG tablet Take 1 tablet (5 mg total) by mouth daily.  . Blood Glucose Monitoring Suppl (ACCU-CHEK AVIVA PLUS) W/DEVICE KIT 1 kit to test blood sugars daily dx 250.00  . dapagliflozin propanediol (FARXIGA) 10 MG TABS tablet Take 10 mg by mouth daily.  . fluconazole (DIFLUCAN) 150 MG tablet One tablet once daily, as needed, for vaginal itch from yeast infection  . loratadine (CLARITIN) 10 MG tablet Take 10 mg by mouth daily.  . montelukast (SINGULAIR) 10 MG tablet Take 1 tablet (10 mg total) by mouth at bedtime.  Marland Kitchen omeprazole (PRILOSEC) 20 MG capsule Take 1 capsule (20 mg total) by mouth  daily.  . ondansetron (ZOFRAN) 4 MG tablet Take 1 tablet (4 mg total) by mouth every 8 (eight) hours as needed for nausea or vomiting.  . promethazine-dextromethorphan (PROMETHAZINE-DM) 6.25-15 MG/5ML syrup Take 5 mLs by mouth at bedtime as needed for cough. (Patient not taking: Reported on 05/28/2015)  .  triamterene-hydrochlorothiazide (MAXZIDE-25) 37.5-25 MG per tablet One and a half tablets once daily  . [DISCONTINUED] ARIPiprazole (ABILIFY) 5 MG tablet TAKE ONE TABLET BY MOUTH ONCE DAILY  . [DISCONTINUED] glipiZIDE (GLUCOTROL XL) 10 MG 24 hr tablet Two tablets once daily  . [DISCONTINUED] metFORMIN (GLUCOPHAGE) 1000 MG tablet TAKE ONE TABLET BY MOUTH TWICE DAILY WITH A MEAL **DOSE  CHANGE**   No facility-administered encounter medications on file as of 05/28/2015.    Physical Exam: Constitutional:  BP 130/87 mmHg  Pulse 101  Ht 5' 5"  (1.651 m)  Wt 231 lb (104.781 kg)  BMI 38.44 kg/m2  Recent Results (from the past 2160 hour(s))  Hemoglobin A1c     Status: Abnormal   Collection Time: 05/24/15  1:40 PM  Result Value Ref Range   Hgb A1c MFr Bld 7.8 (H) <5.7 %    Comment:                                                                        According to the ADA Clinical Practice Recommendations for 2011, when HbA1c is used as a screening test:     >=6.5%   Diagnostic of Diabetes Mellitus            (if abnormal result is confirmed)   5.7-6.4%   Increased risk of developing Diabetes Mellitus   References:Diagnosis and Classification of Diabetes Mellitus,Diabetes XBWI,2035,59(RCBUL 1):S62-S69 and Standards of Medical Care in         Diabetes - 2011,Diabetes Care,2011,34 (Suppl 1):S11-S61.      Mean Plasma Glucose 177 (H) <117 mg/dL  COMPLETE METABOLIC PANEL WITH GFR     Status: Abnormal   Collection Time: 05/24/15  1:40 PM  Result Value Ref Range   Sodium 137 135 - 146 mmol/L   Potassium 3.6 3.5 - 5.3 mmol/L   Chloride 99 98 - 110 mmol/L   CO2 27 20 - 31 mmol/L   Glucose, Bld 128 (H) 65 - 99 mg/dL   BUN 9 7 - 25 mg/dL   Creat 0.83 0.50 - 1.10 mg/dL   Total Bilirubin 0.5 0.2 - 1.2 mg/dL   Alkaline Phosphatase 59 33 - 115 U/L   AST 15 10 - 30 U/L   ALT 14 6 - 29 U/L   Total Protein 6.9 6.1 - 8.1 g/dL   Albumin 4.2 3.6 - 5.1 g/dL   Calcium 9.7 8.6 - 10.2 mg/dL   GFR,  Est African American >89 >=60 mL/min   GFR, Est Non African American 89 >=60 mL/min    Comment:   The estimated GFR is a calculation valid for adults (>=68 years old) that uses the CKD-EPI algorithm to adjust for age and sex. It is   not to be used for children, pregnant women, hospitalized patients,    patients on dialysis, or with rapidly changing kidney function. According to the NKDEP, eGFR >89 is normal, 60-89 shows mild impairment, 30-59 shows  moderate impairment, 15-29 shows severe impairment and <15 is ESRD.       Musculoskeletal: Strength & Muscle Tone: within normal limits Gait & Station: normal Patient leans: Front  Mental Status Examination;  Patient is a middle-aged female who is casually dressed and fairly groomed.  She described her mood good and her affect is improved and appropriate.  Her speech is clear, coherent. Her thought processes is logical and goal directed.  She denies any active or passive suicidal thoughts or homicidal thoughts.  There were no paranoia or delusions present at this time.  Her fund of knowledge is adequate.  She is alert and oriented x3.  Her attention and concentration is fair.  Her insight judgment and impulse control is okay.   Established Problem, Stable/Improving (1), Review or order clinical lab tests (1), Review of Last Therapy Session (1) and Review of Medication Regimen & Side Effects (2)  Assessment: Axis I: Bipolar disorder NOS  Axis II: Deferred  Axis III:  Past Medical History  Diagnosis Date  . GERD (gastroesophageal reflux disease)   . Bipolar disorder ([place])   . Gastroparesis   . 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 Hypertension     chronic on meds  . Diabetes mellitus     type 2 DM, insulin only needed during pregnancy  . Chronic kidney disease     proteinuria    Plan:  Patient is doing better on Abilify 5 mg. Discussed medication side effects and benefits.  Discuss hemoglobin A1c and patient is working on  her diet and cutting on her sugar intake.  At this time she does not have any tremors, shakes or any EPS.  Recommended to call us back if she has any question or any concern.  Follow-up in 3 months.  Julia Kulzer T., MD 05/28/2015

## 2015-06-04 HISTORY — PX: TRIGGER FINGER RELEASE: SHX641

## 2015-06-14 ENCOUNTER — Ambulatory Visit: Payer: Self-pay | Admitting: Nutrition

## 2015-06-22 NOTE — Assessment & Plan Note (Addendum)
Unchanged. Patient re-educated about  the importance of commitment to a  minimum of 150 minutes of exercise per week.  The importance of healthy food choices with portion control discussed. Encouraged to start a food diary, count calories and to consider  joining a support group. Sample diet sheets offered. Goals set by the patient for the next several months.   Weight /BMI 05/28/2015 05/09/2015 04/29/2015  WEIGHT 233 lb 232 lb 12.8 oz 235 lb  HEIGHT 5\' 5"  5\' 5"  -  BMI 38.77 kg/m2 38.74 kg/m2 39.11 kg/m2  Some encounter information is confidential and restricted. Go to Review Flowsheets activity to see all data.    Current exercise per week 90 minutes.

## 2015-06-22 NOTE — Assessment & Plan Note (Signed)
Controlled, no change in medication  

## 2015-06-22 NOTE — Assessment & Plan Note (Signed)
Deteriorated  Has been ill and on steroids in past 2 months, noot at goal. Christina [name] is reminded of the importance of commitment to daily physical activity for 30 minutes or more, as able and the need to limit carbohydrate intake to 30 to 60 grams per meal to help with blood sugar control.   The need to take medication as prescribed, test blood sugar as directed, and to call between visits if there is a concern that blood sugar is uncontrolled is also discussed.   Christina [name] is reminded of the importance of daily foot exam, annual eye examination, and good blood sugar, blood pressure and cholesterol control.  Diabetic Labs Latest Ref Rng 05/24/2015 02/02/2015 11/12/2014 09/19/2014 08/06/2014  HbA1c <5.7 % 7.8(H) 7.6(H) 8.0(H) - 8.5(H)  Microalbumin <2.0 mg/dL - - - 8.3(H) -  Micro/Creat Ratio 0.0 - 30.0 mg/g - - - 40.7(H) -  Chol 0 - 200 mg/dL - 134 - - -  HDL >=46 mg/dL - 55 - - -  Calc LDL 0 - 99 mg/dL - 60 - - -  Triglycerides <150 mg/dL - 96 - - -  Creatinine 0.50 - 1.10 mg/dL 0.83 0.82 0.82 - 0.85   BP/Weight 05/28/2015 05/09/2015 04/29/2015 02/21/2015 02/07/2015 02/06/2015 [id number]  Systolic BP 123XX123 - 123456 123XX123 123456 - [id number]  Diastolic BP 82 - 82 76 72 - 84  Wt. (Lbs) 233 232.8 235 230 227 225 224  BMI 38.77 38.74 39.11 38.27 37.77 37.44 37.28  Some encounter information is confidential and restricted. Go to Review Flowsheets activity to see all data.   Foot/eye exam completion dates 09/03/2014 08/16/2013  Foot Form Completion Done Done

## 2015-06-22 NOTE — Assessment & Plan Note (Signed)
Managed by psych, stable and doing well at home and on the job

## 2015-06-22 NOTE — Assessment & Plan Note (Signed)
Controlled, no change in medication DASH diet and commitment to daily physical activity for a minimum of 30 minutes discussed and encouraged, as a part of hypertension management. The importance of attaining a healthy weight is also discussed.  BP/Weight 05/28/2015 05/09/2015 04/29/2015 02/21/2015 02/07/2015 02/06/2015 [id number]  Systolic BP 123XX123 - 123456 123XX123 123456 - [id number]  Diastolic BP 82 - 82 76 72 - 84  Wt. (Lbs) 233 232.8 235 230 227 225 224  BMI 38.77 38.74 39.11 38.27 37.77 37.44 37.28  Some encounter information is confidential and restricted. Go to Review Flowsheets activity to see all data.

## 2015-07-10 ENCOUNTER — Encounter: Payer: Self-pay | Admitting: Nutrition

## 2015-07-10 ENCOUNTER — Encounter: Payer: 59 | Attending: Pediatrics | Admitting: Nutrition

## 2015-07-10 VITALS — Ht 65.0 in | Wt 231.0 lb

## 2015-07-10 DIAGNOSIS — Z713 Dietary counseling and surveillance: Secondary | ICD-10-CM | POA: Insufficient documentation

## 2015-07-10 DIAGNOSIS — E119 Type 2 diabetes mellitus without complications: Secondary | ICD-10-CM | POA: Diagnosis not present

## 2015-07-10 DIAGNOSIS — O24111 Pre-existing diabetes mellitus, type 2, in pregnancy, first trimester: Secondary | ICD-10-CM

## 2015-07-10 DIAGNOSIS — Z331 Pregnant state, incidental: Secondary | ICD-10-CM | POA: Diagnosis not present

## 2015-07-10 NOTE — Progress Notes (Signed)
Medical Nutrition Therapy:  Appt start time: 1600 end time:  1630   Assessment:  Primary concerns today: Diabetes. Type 2. Just now found out she was pregnant 5-[redacted] weeks pregnant. EDD 03/10/2016. FBS 199 mg/dl this am. She is on 1000 mg of Metformin BID and took a Glipiizide today because she didn't take her Invokana since she is pregnant. Advised not to take Glipizide anymore. She will be going to OBGYN on Monday and will most likely be seen at the [facility]. She has had to be on insulin in her last 2 pregnancies. Most recent A1C 7.8% in 05/2015.  She is anticipating  to go on insulin next week . [facility].  She will be starting to test her blood sugars 4 times per day. Goals: FBS 60-90 mg/dl and 2 hours after meals less than 120 mg/dl.  Diet needs more lower carb vegetables and high fiber foods.    Wt Readings from Last 3 Encounters:  07/10/15 231 lb (104.781 kg)  05/28/15 231 lb (104.781 kg)  05/28/15 233 lb (105.688 kg)   Ht Readings from Last 3 Encounters:  07/10/15 5\' 5"  (1.651 m)  05/28/15 5\' 5"  (1.651 m)  05/28/15 5\' 5"  (1.651 m)   Body mass index is 38.44 kg/(m^2).  Lab Results  Component Value Date   HGBA1C 7.8* 05/24/2015   Lab Results  Component Value Date   CHOL 134 02/02/2015   HDL 55 02/02/2015   LDLCALC 60 02/02/2015   TRIG 96 02/02/2015   CHOLHDL 2.4 02/02/2015   Preferred Learning Style:   Auditory  Visual  Hands on  Learning Readiness:    Ready  Change in progress  MEDICATIONS: See list   DIETARY INTAKE:  24-hr recall:  B) Boiled eggs 2 and oatmeal Or Bagel L) Roast [place] slices, flatbread and 1.2 bag chip (15 grams) and 1 halo, water. D) [place] breast,  Yams 1/4 c, green beans, halo 3, 1/4 potato salad, water  Beverages: water  Usual physical activity:Walks more and starting to play tennis.  Estimated energy needs: 1500-1800   Calories for pregnancy 170 g carbohydrates 112 g protein 42 g fat  Progress Towards  Goal(s):  In progress.   Nutritional Diagnosis:  NB-1.1 Food and nutrition-related knowledge deficit As related to Diabetes.  As evidenced by A1C >8%..    Intervention:  Nutrition and diabetes education provided on diet, meal planning,  Diabetes disease, complications, target ranges for BS, CHO Counting, monitoring and importance of exercise for BS and weight loss. Prenatal guidelines for diabetes, risks and importance of insulin and it's use to control blood sugars, benefits of exercise and need to drink lots of water.No juice at all and no fruit at breakfast: Goals: 30 g CHO at B/ 30-35 at lunch and dinner and 15 g of CHO plus protein with snacks and 3 oz protein and veggies with lunch and supper. Advised to stop Glipizide and only take Metformin til she sees her OBGYN.  Goals:  Follow Diabetes Meal Plan as instructed  30 g breakfast with 1 oz protein  30-45 g CHO at lunch and dinner plus 3 oz protein and 2+ low carb veggies.  Snacks 15 g Carbs plus protein   between meals.  Target blood sugar  goals: 60-90 mg/dl before breakfast and less than 120 mg/dl 2 hours after meals.  Exercise 30 minutes daily.  Request to see CDE at high risk clinic next week.  Keep BS log and take to MD"s offices.  Teaching Method Utilized:  Visual Auditory Hands on  Handouts given during visit include: The Plate Method Carb Counting and Food Label handouts Meal Plan Card   Barriers to learning/adherence to lifestyle change: none  Demonstrated degree of understanding via:  Teach Back   Monitoring/Evaluation:  Dietary intake, exercise, meal planning, and body weight in 3 month(s).  Marland Kitchen

## 2015-07-10 NOTE — Patient Instructions (Addendum)
Goals:  Follow Diabetes Meal Plan as instructed  30 g CHO breakfast with 1 oz protein  30-45 g CHO at lunch and dinner plus 3 oz protein and 2+ low carb veggies.  Snacks 15 g CHO plus protein between meals.  Target goals: 60-90 mg/dl before breakfast and less than 120 md/dl 2 hours after meals.  Exercise 30 minutes daily.  Request to see CDE at high risk clinic next week.  Keep BS log and take to MD"s offices.

## 2015-07-11 ENCOUNTER — Encounter: Payer: Self-pay | Admitting: Family Medicine

## 2015-07-15 LAB — OB RESULTS CONSOLE RUBELLA ANTIBODY, IGM: Rubella: IMMUNE

## 2015-07-15 LAB — OB RESULTS CONSOLE ABO/RH: RH Type: POSITIVE

## 2015-07-15 LAB — OB RESULTS CONSOLE HEPATITIS B SURFACE ANTIGEN: Hepatitis B Surface Ag: NEGATIVE

## 2015-07-15 LAB — OB RESULTS CONSOLE HIV ANTIBODY (ROUTINE TESTING): HIV: NONREACTIVE

## 2015-07-15 LAB — OB RESULTS CONSOLE ANTIBODY SCREEN: Antibody Screen: NEGATIVE

## 2015-07-15 LAB — OB RESULTS CONSOLE GC/CHLAMYDIA
Chlamydia: NEGATIVE
Gonorrhea: NEGATIVE

## 2015-07-15 LAB — OB RESULTS CONSOLE RPR: RPR: NONREACTIVE

## 2015-08-19 ENCOUNTER — Encounter (HOSPITAL_COMMUNITY): Payer: Self-pay | Admitting: Psychiatry

## 2015-08-19 ENCOUNTER — Ambulatory Visit (INDEPENDENT_AMBULATORY_CARE_PROVIDER_SITE_OTHER): Payer: Self-pay | Admitting: Psychiatry

## 2015-08-19 VITALS — BP 122/84 | HR 79 | Ht 64.0 in | Wt 231.4 lb

## 2015-08-19 DIAGNOSIS — F319 Bipolar disorder, unspecified: Secondary | ICD-10-CM

## 2015-08-19 NOTE — Progress Notes (Signed)
[facility] Progress Note  KYMORAH KORF [id number] 40 y.o.  08/19/2015 2:11 PM  Chief Complaint:  I am pregnant and I stop taking Abilify.  I'm feeling depressed and some time emotional but I do not want to restart medication.       History of Present Illness:  Avanell came for her appointment.  She admitted noncompliant with her contraceptive in December due to complications and soon after she find out that she is pregnant.  She stopped taking Abilify immediately.  She is [redacted] weeks pregnant .  She has seen her OB/GYN for intake and now her next appointment is on February 7.  She admitted some time emotional, tearful and irritable but denies any paranoia or any hallucination.  This is her third pregnancy .  Patient has history of not taking the medication during her pregnancy and did well.  She is aware about risk of worsening of depression and bipolar symptoms due to noncompliance with medication .  She is more concerned about her diabetes and recently her primary care physician switched her to insulin.  She is getting over checkup with Dr. Moshe Cipro and liked to keep appointment with her OB/GYN on every 7.  Her energy level is okay.  She admitted getting frustrated sometimes but denies any hallucination, suicidal thoughts or homicidal thought.  She had a very good support from her husband.  Patient denies drinking or using any illegal substances.  She is taking her multivitamin once and liked to keep her antenatal care.  Her blood pressure is a stable.  Suicidal Ideation: No Plan Formed: No Patient has means to carry out plan: No  Homicidal Ideation: No Plan Formed: No Patient has means to carry out plan: No  Medical History; Her primary care physician is Dr. Tula Nakayama.  Patient has history of diabetes, hypertension, gastroparesis, goiter, proteinuria and GERD.  Past Psychiatric History/Hospitalization(s) Patient has been seeing in this office since 2006. She was  referred from her primary care physician Dr. Moshe Cipro. She has history of depression and mania and psychosis. In 1997 she was admitted at [facility] because of suicidal gesture.  In the past she has taken Prozac and Wellbutrin.  She denies any history of sexual, verbal emotional abuse.   Anxiety: Yes Bipolar Disorder: Yes Depression: Yes Mania: Yes Psychosis: Yes Schizophrenia: No Personality Disorder: No Hospitalization for psychiatric illness: Yes History of Electroconvulsive Shock Therapy: No Prior Suicide Attempts: No   Review of Systems  Skin: Negative for itching and rash.  Neurological: Negative.   Psychiatric/Behavioral: Negative for suicidal ideas, hallucinations and substance abuse. The patient is nervous/anxious. The patient does not have insomnia.    Psychiatric: Agitation: No Hallucination: No Depressed Mood: No Insomnia: No Hypersomnia: No Altered Concentration: No Feels Worthless: No Grandiose Ideas: No Belief In Special Powers: No New/Increased Substance Abuse: No Compulsions: No  Neurologic: Headache: No Seizure: No Paresthesias: No    Outpatient Encounter Prescriptions as of 08/19/2015  Medication Sig  . Blood Glucose Monitoring Suppl (ACCU-CHEK AVIVA PLUS) W/DEVICE KIT 1 kit to test blood sugars daily dx 250.00  . [place] 100 UNIT/ML Kiwkpen   . loratadine (CLARITIN) 10 MG tablet Take 10 mg by mouth daily.  . metFORMIN (GLUCOPHAGE) 1000 MG tablet TAKE ONE TABLET BY MOUTH TWICE DAILY WITH A MEAL **DOSE  CHANGE**  . montelukast (SINGULAIR) 10 MG tablet Take 1 tablet (10 mg total) by mouth at bedtime.  Marland Kitchen omeprazole (PRILOSEC) 20 MG capsule Take 1 capsule (20  mg total) by mouth daily.  . ondansetron (ZOFRAN) 4 MG tablet Take 1 tablet (4 mg total) by mouth every 8 (eight) hours as needed for nausea or vomiting.  . promethazine-dextromethorphan (PROMETHAZINE-DM) 6.25-15 MG/5ML syrup Take 5 mLs by mouth at bedtime as needed for cough. (Patient not taking:  Reported on 05/28/2015)  . [DISCONTINUED] albuterol (PROVENTIL HFA;VENTOLIN HFA) 108 (90 BASE) MCG/ACT inhaler Inhale 2 puffs into the lungs every 4 (four) hours as needed for wheezing or shortness of breath.  . [DISCONTINUED] amLODipine (NORVASC) 10 MG tablet Take 10 mg by mouth daily.  . [DISCONTINUED] ARIPiprazole (ABILIFY) 5 MG tablet Take 1 tablet (5 mg total) by mouth daily.  . [DISCONTINUED] dapagliflozin propanediol (FARXIGA) 10 MG TABS tablet Take 10 mg by mouth daily. (Patient not taking: Reported on 07/10/2015)  . [DISCONTINUED] fluconazole (DIFLUCAN) 150 MG tablet One tablet once daily, as needed, for vaginal itch from yeast infection  . [DISCONTINUED] glipiZIDE (GLUCOTROL XL) 10 MG 24 hr tablet Two tablets once daily  . [DISCONTINUED] triamterene-hydrochlorothiazide (MAXZIDE-25) 37.5-25 MG per tablet One and a half tablets once daily   No facility-administered encounter medications on file as of 08/19/2015.    Physical Exam: Constitutional:  BP 122/84 mmHg  Pulse 79  Ht 5' 4"  (1.626 m)  Wt 231 lb 6.4 oz (104.962 kg)  BMI 39.70 kg/m2  LMP   Recent Results (from the past 2160 hour(s))  Hemoglobin A1c     Status: Abnormal   Collection Time: 05/24/15  1:40 PM  Result Value Ref Range   Hgb A1c MFr Bld 7.8 (H) <5.7 %    Comment:                                                                        According to the ADA Clinical Practice Recommendations for 2011, when HbA1c is used as a screening test:     >=6.5%   Diagnostic of Diabetes Mellitus            (if abnormal result is confirmed)   5.7-6.4%   Increased risk of developing Diabetes Mellitus   References:Diagnosis and Classification of Diabetes Mellitus,Diabetes LJQG,9201,00(FHQRF 1):S62-S69 and Standards of Medical Care in         Diabetes - 2011,Diabetes Care,2011,34 (Suppl 1):S11-S61.      Mean Plasma Glucose 177 (H) <117 mg/dL  COMPLETE METABOLIC PANEL WITH GFR     Status: Abnormal   Collection Time: 05/24/15   1:40 PM  Result Value Ref Range   Sodium 137 135 - 146 mmol/L   Potassium 3.6 3.5 - 5.3 mmol/L   Chloride 99 98 - 110 mmol/L   CO2 27 20 - 31 mmol/L   Glucose, Bld 128 (H) 65 - 99 mg/dL   BUN 9 7 - 25 mg/dL   Creat 0.83 0.50 - 1.10 mg/dL   Total Bilirubin 0.5 0.2 - 1.2 mg/dL   Alkaline Phosphatase 59 33 - 115 U/L   AST 15 10 - 30 U/L   ALT 14 6 - 29 U/L   Total Protein 6.9 6.1 - 8.1 g/dL   Albumin 4.2 3.6 - 5.1 g/dL   Calcium 9.7 8.6 - 10.2 mg/dL   GFR, Est African American >89 >=60 mL/min  GFR, Est Non African American 89 >=60 mL/min    Comment:   The estimated GFR is a calculation valid for adults (>=31 years old) that uses the CKD-EPI algorithm to adjust for age and sex. It is   not to be used for children, pregnant women, hospitalized patients,    patients on dialysis, or with rapidly changing kidney function. According to the NKDEP, eGFR >89 is normal, 60-89 shows mild impairment, 30-59 shows moderate impairment, 15-29 shows severe impairment and <15 is ESRD.       Musculoskeletal: Strength & Muscle Tone: within normal limits Gait & Station: normal Patient leans: Front  Mental Status Examination;  Patient is a middle-aged female who is casually dressed and fairly groomed.  She described her mood good and her affect is improved and appropriate.  Her speech is clear, coherent. Her thought processes is logical and goal directed.  She denies any active or passive suicidal thoughts or homicidal thoughts.  There were no paranoia or delusions present at this time.  Her fund of knowledge is adequate.  She is alert and oriented x3.  Her attention and concentration is fair.  Her insight judgment and impulse control is okay.   Established Problem, Stable/Improving (1), Review or order clinical lab tests (1), Review of Last Therapy Session (1) and Review of Medication Regimen & Side Effects (2)  Assessment: Axis I: Bipolar disorder NOS  Axis II: Deferred  Axis III:  Past  Medical History  Diagnosis Date  . GERD (gastroesophageal reflux disease)   . Bipolar disorder ([place])   . Gastroparesis   . 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 Hypertension     chronic on meds  . Diabetes mellitus     type 2 DM, insulin only needed during pregnancy  . Chronic kidney disease     proteinuria    Plan:  Patient is pregnant [redacted] weeks.  She had stopped taking Abilify .  I have a long discussion with the patient about risk of noncompliance with medication, teratogenic effects on fetus and long-term prognosis.  In the past patient has not taking the medication while she was pregnant.  She liked to stay away from psychiatric medication during her pregnancy .  However I discuss if she felt symptoms are getting worse and if she notices severe depression, mania or any suicidal thoughts and she should not take that long .  Patient has appointment to see her OB/GYN Dr. Mancel Bale on February 7 and I encourage her to discuss her psychiatric medication with her and if agreed consider taking low-dose Abilify if safe in pregnancy.  Patient promised that she will discuss with her OB/GYN next month.  I also encouraged to see therapist in this office to help coping and social skills.  We will schedule appointment with [facility].  Discuss safety plan that anytime having active suicidal thoughts or homicidal thoughts and she need to call 911 or go to local emergency room.  I will see her again in 6 weeks  Teressa Mcglocklin T., MD 08/19/2015

## 2015-08-28 ENCOUNTER — Ambulatory Visit (HOSPITAL_COMMUNITY): Payer: Self-pay | Admitting: Psychiatry

## 2015-09-02 ENCOUNTER — Ambulatory Visit: Payer: Self-pay | Admitting: Family Medicine

## 2015-09-11 ENCOUNTER — Ambulatory Visit (HOSPITAL_COMMUNITY): Payer: Self-pay | Admitting: Psychology

## 2015-09-14 NOTE — Progress Notes (Signed)
REVIEWED-NO ADDITIONAL RECOMMENDATIONS. 

## 2015-09-24 ENCOUNTER — Ambulatory Visit (HOSPITAL_COMMUNITY): Payer: Self-pay | Admitting: Psychology

## 2015-09-30 ENCOUNTER — Ambulatory Visit (HOSPITAL_COMMUNITY): Payer: Self-pay | Admitting: Psychiatry

## 2015-10-03 ENCOUNTER — Encounter: Payer: Self-pay | Admitting: *Deleted

## 2015-10-03 ENCOUNTER — Encounter: Payer: 59 | Attending: Pediatrics | Admitting: *Deleted

## 2015-10-03 ENCOUNTER — Telehealth (HOSPITAL_COMMUNITY): Payer: Self-pay | Admitting: *Deleted

## 2015-10-03 VITALS — Ht 64.5 in | Wt 235.1 lb

## 2015-10-03 DIAGNOSIS — Z713 Dietary counseling and surveillance: Secondary | ICD-10-CM | POA: Diagnosis not present

## 2015-10-03 DIAGNOSIS — E119 Type 2 diabetes mellitus without complications: Secondary | ICD-10-CM | POA: Diagnosis not present

## 2015-10-03 DIAGNOSIS — Z331 Pregnant state, incidental: Secondary | ICD-10-CM | POA: Diagnosis not present

## 2015-10-03 DIAGNOSIS — O24112 Pre-existing diabetes mellitus, type 2, in pregnancy, second trimester: Secondary | ICD-10-CM

## 2015-10-03 NOTE — Progress Notes (Signed)
Patient previously met with Christina [name], RD, CDE for nutritional management. Patient then learned she was pregnant. She is under the [name]. Christina [name] was referred to Christina [name] for management of her T2DM during pregnancy. Patient is presently taking NPH 20units HS and Humalog sliding scale with meals. Total average insulin use average 44 units per day. Patient was scheduled to be seen in follow-up by  Christina [name] one month after insulin changes. In review of patient glucose readings: FBS 115-186, 2hpp range 146-212m/dl. Patient's present insulin needs calculate to greater than 80units per day. In discussion with Dr. DCharlesetta Garibaldiwe will modify insulin regimen to Humalin N Flexpen 30 units at breakfast 14units @HS , Humalog R 19units with breakfast and 14units with Dinner. Patient verbalized understanding of Rule of 15 for treatment of hypoglycemia. I suggested referral to Christina [name] ([name] and co-management of [id number] by pregnancy. Other option to refer to Endocrinologist for management. I will see Christina [name] one week for evaluation of glucose readings pending referral to Christina [name]

## 2015-10-04 ENCOUNTER — Ambulatory Visit (HOSPITAL_COMMUNITY): Payer: Self-pay | Admitting: Psychiatry

## 2015-10-08 ENCOUNTER — Ambulatory Visit (HOSPITAL_COMMUNITY): Payer: Self-pay | Admitting: Psychiatry

## 2015-10-10 ENCOUNTER — Ambulatory Visit (HOSPITAL_COMMUNITY)
Admission: RE | Admit: 2015-10-10 | Discharge: 2015-10-10 | Disposition: A | Discharge status: Home / Self Care | Payer: 59 | Source: Ambulatory Visit | Attending: Obstetrics and Gynecology | Admitting: Obstetrics and Gynecology

## 2015-10-10 ENCOUNTER — Encounter: Payer: 59 | Admitting: *Deleted

## 2015-10-10 DIAGNOSIS — Z713 Dietary counseling and surveillance: Secondary | ICD-10-CM | POA: Diagnosis not present

## 2015-10-10 NOTE — Progress Notes (Signed)
Patient presents for review of glucose readings. Greater than 50% of readings are greater than desired parameters. However 10/08/15 patient experienced low of 50mg /dl at 0158, treated and recovered 104mg /dl. Low of 66 at 1302 while eating lunch, recovered to 90mg /dl.  Redings peaked at 2058 to 235mg /dl than at 12:28 another low of 56mg /dl, emesis, diarrhea, treated and recovered to 111mg /dl @ 2:05. Patient recognizes that eating behavior have contributed to gross variability of glucose readings. She has agreed to; eat on as regular a schedule as possible, always combine carbohydrates and proteins together for all meals and snacks. No changes will be made in insulin regimen and we will see Christina [name] back next week for further review of readings.

## 2015-10-17 ENCOUNTER — Ambulatory Visit (HOSPITAL_COMMUNITY): Payer: Self-pay

## 2015-10-18 ENCOUNTER — Ambulatory Visit (HOSPITAL_COMMUNITY)
Admission: RE | Admit: 2015-10-18 | Discharge: 2015-10-18 | Disposition: A | Discharge status: Home / Self Care | Payer: 59 | Source: Ambulatory Visit | Attending: Obstetrics and Gynecology | Admitting: Obstetrics and Gynecology

## 2015-10-18 ENCOUNTER — Encounter (HOSPITAL_COMMUNITY): Payer: Self-pay

## 2015-10-18 DIAGNOSIS — O24112 Pre-existing diabetes mellitus, type 2, in pregnancy, second trimester: Secondary | ICD-10-CM | POA: Insufficient documentation

## 2015-10-18 DIAGNOSIS — E1122 Type 2 diabetes mellitus with diabetic chronic kidney disease: Secondary | ICD-10-CM | POA: Insufficient documentation

## 2015-10-18 DIAGNOSIS — K219 Gastro-esophageal reflux disease without esophagitis: Secondary | ICD-10-CM | POA: Diagnosis not present

## 2015-10-18 DIAGNOSIS — O10012 Pre-existing essential hypertension complicating pregnancy, second trimester: Secondary | ICD-10-CM | POA: Diagnosis not present

## 2015-10-18 DIAGNOSIS — Z3A19 19 weeks gestation of pregnancy: Secondary | ICD-10-CM | POA: Diagnosis not present

## 2015-10-18 DIAGNOSIS — O26832 Pregnancy related renal disease, second trimester: Secondary | ICD-10-CM | POA: Insufficient documentation

## 2015-10-18 DIAGNOSIS — Z794 Long term (current) use of insulin: Secondary | ICD-10-CM | POA: Diagnosis not present

## 2015-10-18 DIAGNOSIS — K3184 Gastroparesis: Secondary | ICD-10-CM | POA: Diagnosis not present

## 2015-10-18 DIAGNOSIS — N189 Chronic kidney disease, unspecified: Secondary | ICD-10-CM | POA: Insufficient documentation

## 2015-10-18 DIAGNOSIS — O99612 Diseases of the digestive system complicating pregnancy, second trimester: Secondary | ICD-10-CM | POA: Insufficient documentation

## 2015-10-18 DIAGNOSIS — I129 Hypertensive chronic kidney disease with stage 1 through stage 4 chronic kidney disease, or unspecified chronic kidney disease: Secondary | ICD-10-CM | POA: Insufficient documentation

## 2015-10-18 NOTE — Consult Note (Signed)
Maternal Fetal Medicine Consultation  Requesting Provider(s): Crawford Givens, MD  Reason for consultation:  Type 2 diabetes on insulin- assist in management  HPI: Christina [name] is a 40 yo G3P2002, EDD 03/13/2016 currently at 19w 0d seen for consultation due to type 2 diabetes.  Christina [name] reports a history of type 2 diabetes for approximately 11 years.  The most recent HbA1C in the Epic chart was 7.6 (7/02) although more recent testing has been performed but not available at the time of consultation.  Christina [name] reports a history of diabetic gastroparesis.  There is some question of kidney problems - was previously followed by Nephrology, but per patient report it is not thought to be due to diabetic nephropathy.  Christina [name] was previously followed by Endocrinology (Christina [name]) but has most recently been seen by Christina [name] for diabetic education.  She is currently on NPH insulin 30  Units each AM and 16 units at night as well at 19 units of Humalog in the morning and 14 units of Humalog in the evening and Metformin 1000 mg BID. Her last evaluation of eye grounds was within the last year. She has a scheduled anatomy ultrasound with [facility] next week and a fetal echo scheduled.  Christina [name] past OB history is remarkable for a 32 week delivery in 2007 due to preeclampsia / HELLP syndrome and a subsequent term SVD at 38 weeks in [id number] without complications.  She is without complaints today.  Fingerstick log was reviewed - over the last 1-2 weeks, has had consistently elevated fasting values in the 115-120 range and frequent post prandial values in the 150-200 range.  OB History: OB History    Gravida Para Term Preterm AB TAB SAB Ectopic Multiple Living   _0 PMH:  Past Medical History  Diagnosis Date  . GERD (gastroesophageal reflux disease)   . Bipolar disorder ([place])   . Gastroparesis   . 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 Hypertension     chronic on meds  . Diabetes mellitus    type 2 DM, insulin only needed during pregnancy  . Chronic kidney disease     proteinuria    PSH:  Past Surgical History  Procedure Laterality Date  . Breast reduction surgery  1994  . Dermoid tumor  2000  . Esophagogastroduodenoscopy  07/01/2009    MIW:OEHOZY esphagus without barrett's/dilation with 16 mm/mild erthyema in the antrum. mild chronic gastritis on path.    Meds:  Current Outpatient Prescriptions on File Prior to Encounter  Medication Sig Dispense Refill  . Blood Glucose Monitoring Suppl (ACCU-CHEK AVIVA PLUS) W/DEVICE KIT 1 kit to test blood sugars daily dx 250.00 1 kit 0  . HUMULIN N KWIKPEN 100 UNIT/ML Kiwkpen Inject 14-30 Units into the skin 2 (two) times daily. 30 units with breakfast and 14 units _1     . insulin lispro (HUMALOG) 100 UNIT/ML injection Inject 14-19 Units into the skin 2 (two) times daily. 19units with Breakfast and 14units with Dinner    . loratadine (CLARITIN) 10 MG tablet Take 10 mg by mouth daily.    . metFORMIN (GLUCOPHAGE) 1000 MG tablet TAKE ONE TABLET BY MOUTH TWICE DAILY WITH A MEAL **DOSE  CHANGE** 60 tablet 5  . omeprazole (PRILOSEC) 20 MG capsule Take 1 capsule (20 mg total) by mouth daily. 90 capsule 3  . montelukast (SINGULAIR) 10 MG tablet Take 1 tablet (  10 mg total) by mouth at bedtime. (Patient not taking: Reported on 10/18/2015) 30 tablet 3  . ondansetron (ZOFRAN) 4 MG tablet Take 1 tablet (4 mg total) by mouth every 8 (eight) hours as needed for nausea or vomiting. (Patient not taking: Reported on 10/18/2015) 30 tablet 11  . promethazine-dextromethorphan (PROMETHAZINE-DM) 6.25-15 MG/5ML syrup Take 5 mLs by mouth at bedtime as needed for cough. (Patient not taking: Reported on 05/28/2015) 118 mL 0   No current facility-administered medications on file prior to encounter.    Allergies:  Allergies  Allergen Reactions  . Peanut Oil Anaphylaxis  . Peanut-Containing Drug Products Anaphylaxis  . Ace Inhibitors Cough    Pt on no  contaception and is at reproductive age, discussed with nephrology also  . Amoxicillin-Pot Clavulanate Nausea And Vomiting    Stomach cramps  . Invokana [Canagliflozin] Other (See Comments)     Worried about ketoacidosis  . Other     Tree nuts   FH:  Family History  Problem Relation Age of Onset  . Diabetes Mother   . Hypertension Mother   . Fibromyalgia Mother   . Diabetes Father   . Hypertension Father   . Asthma Father   . Colon cancer Neg Hx     Soc:  Social History   Social History  . Marital Status: Married    Spouse Name: N/A  . Number of Children: N/A  . Years of Education: N/A   Occupational History  . [employer]    Social History Main Topics  . Smoking status: Never Smoker   . Smokeless tobacco: Never Used  . Alcohol Use: No  . Drug Use: No  . Sexual Activity:    Partners: Male    Birth Control/ Protection: Rhythm     Comment: Last Intercourse about 10 days ago   Other Topics Concern  . Not on file   Social History Narrative    Review of Systems: no vaginal bleeding or cramping/contractions, no LOF, no nausea/vomiting. All other systems reviewed and are negative.  PE:   Filed Vitals:   10/18/15 1427  BP: 136/79  Pulse: 103   A/P: 1) Single IUP at 19w 0d  2) Type 2 diabetes on insulin - marginally controlled  3) Hx of early onset preeclampsia and HELLP syndrome  4) ? Nephropathy - not felt to be secondary to diabetes  5) Diabetic gastroparesis   Recommendations: 1) Recommend baseline 24 hr urine protein and creatine clearance and baseline preeclampsia labs (if not already done) due to history of HELLP / early onset preeclampsia 2) Would add baby aspirin (81 mg) daily due to risk of preeclampsia 3) Christina [name] is scheduled for an anatomy ultrasound next week.  Recommend serial ultrasounds every 4 weeks thereafter.  Please contact our office if you would prefer that any of these studies be performed with MFM. 4) Will  increase dose of NPH insulin at night to 16 units.  I personally do not feel that this will be adequate, but given the patient's concern of hypoglycemia and gastroparesis, may need to increase insulin dose gradually.  She will likely need a higher dose of NPH insulin both morning and night and will likely need an increased meal time dose as well.  Ms. Galentine is tentatively scheduled for follow up in 2 weeks - we can make further adjustments to her insulin regimen at that time. 5) Check HbA1C every trimester 6) Concur with fetal echo as scheduled 7) Would start  antenatal testing (2x weekly NSTs with weekly AFIs or weekly BPPs) no later than 32 weeks 8) Delivery at 39 weeks in the absence of other complications.   Thank you for the opportunity to be a part of the care of [place]. Please contact our office if we can be of further a ssistance.   I spent approximately 30 minutes with this patient with over 50% of time spent in face-to-face counseling.  Benjaman Lobe, MD Maternal Fetal Medicine

## 2015-10-21 NOTE — Telephone Encounter (Signed)
Spoke with Dr. Charlesetta Garibaldi sharing my concerns of diaetes out of control. Insulin changes made.

## 2015-10-22 ENCOUNTER — Ambulatory Visit (HOSPITAL_COMMUNITY): Payer: Self-pay | Admitting: Psychology

## 2015-10-24 ENCOUNTER — Ambulatory Visit (HOSPITAL_COMMUNITY): Payer: 59

## 2015-11-01 ENCOUNTER — Ambulatory Visit (HOSPITAL_COMMUNITY)
Admission: RE | Admit: 2015-11-01 | Discharge: 2015-11-01 | Disposition: A | Discharge status: Home / Self Care | Payer: 59 | Source: Ambulatory Visit | Attending: Obstetrics and Gynecology | Admitting: Obstetrics and Gynecology

## 2015-11-01 ENCOUNTER — Encounter (HOSPITAL_COMMUNITY): Payer: Self-pay

## 2015-11-01 DIAGNOSIS — O24112 Pre-existing diabetes mellitus, type 2, in pregnancy, second trimester: Secondary | ICD-10-CM | POA: Insufficient documentation

## 2015-11-01 DIAGNOSIS — Z880 Allergy status to penicillin: Secondary | ICD-10-CM | POA: Diagnosis not present

## 2015-11-01 DIAGNOSIS — O99612 Diseases of the digestive system complicating pregnancy, second trimester: Secondary | ICD-10-CM | POA: Diagnosis not present

## 2015-11-01 DIAGNOSIS — I129 Hypertensive chronic kidney disease with stage 1 through stage 4 chronic kidney disease, or unspecified chronic kidney disease: Secondary | ICD-10-CM | POA: Diagnosis not present

## 2015-11-01 DIAGNOSIS — O99342 Other mental disorders complicating pregnancy, second trimester: Secondary | ICD-10-CM | POA: Insufficient documentation

## 2015-11-01 DIAGNOSIS — K3184 Gastroparesis: Secondary | ICD-10-CM | POA: Insufficient documentation

## 2015-11-01 DIAGNOSIS — Z7984 Long term (current) use of oral hypoglycemic drugs: Secondary | ICD-10-CM | POA: Insufficient documentation

## 2015-11-01 DIAGNOSIS — N189 Chronic kidney disease, unspecified: Secondary | ICD-10-CM | POA: Insufficient documentation

## 2015-11-01 DIAGNOSIS — O10212 Pre-existing hypertensive chronic kidney disease complicating pregnancy, second trimester: Secondary | ICD-10-CM | POA: Diagnosis not present

## 2015-11-01 DIAGNOSIS — K219 Gastro-esophageal reflux disease without esophagitis: Secondary | ICD-10-CM | POA: Diagnosis not present

## 2015-11-01 DIAGNOSIS — E1165 Type 2 diabetes mellitus with hyperglycemia: Secondary | ICD-10-CM | POA: Insufficient documentation

## 2015-11-01 DIAGNOSIS — Z8719 Personal history of other diseases of the digestive system: Secondary | ICD-10-CM | POA: Diagnosis not present

## 2015-11-01 DIAGNOSIS — F319 Bipolar disorder, unspecified: Secondary | ICD-10-CM | POA: Diagnosis not present

## 2015-11-01 DIAGNOSIS — O98312 Other infections with a predominantly sexual mode of transmission complicating pregnancy, second trimester: Secondary | ICD-10-CM | POA: Diagnosis not present

## 2015-11-01 DIAGNOSIS — Z3A21 21 weeks gestation of pregnancy: Secondary | ICD-10-CM | POA: Insufficient documentation

## 2015-11-01 DIAGNOSIS — E1143 Type 2 diabetes mellitus with diabetic autonomic (poly)neuropathy: Secondary | ICD-10-CM | POA: Insufficient documentation

## 2015-11-01 DIAGNOSIS — Z794 Long term (current) use of insulin: Secondary | ICD-10-CM | POA: Insufficient documentation

## 2015-11-01 DIAGNOSIS — A6000 Herpesviral infection of urogenital system, unspecified: Secondary | ICD-10-CM | POA: Diagnosis not present

## 2015-11-01 NOTE — Consult Note (Signed)
Maternal Fetal Medicine Consultation  Requesting Provider(s): Crawford Givens, MD  Reason for consultation: Poorly controlled type 2 diabetes for follow up blood sugar evaluation  HPI: Christina [name] is a 40 yo G3P2002, EDD 03/13/2016 currently at [place] seen for follow up consultation due to type 2 diabetes. Christina [name] reports a history of type 2 diabetes for approximately 11 years.. Christina [name] reports a history of diabetic gastroparesis. There is some question of kidney problems - was previously followed by Nephrology, but per patient report it is not thought to be due to diabetic nephropathy. Christina [name] was previously followed by Endocrinology (Dr. Carlis Abbott) but has most recently been seen by Christina [name] for diabetic education. Please see my previous consult note - the patient returns today for follow up fingerstick evaluation.  She is currently on 30 units of NPH and 24 units of regular each AM, 20 units of regular insulin at dinner and 20 units of NPH at night.  Fingerstick log was reviewed - continues to have elevated fasting values (105-125 mg since last insulin adjustment) and frequent elevated post prandial values (136-162 mg/dl)  OB History: OB History    Gravida Para Term Preterm AB TAB SAB Ectopic Multiple Living   3 2 1 1      2       PMH:  Past Medical History  Diagnosis Date  . GERD (gastroesophageal reflux disease)   . Bipolar disorder ([place])   . Gastroparesis   . 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 Hypertension     chronic on meds  . Diabetes mellitus     type 2 DM, insulin only needed during pregnancy  . Chronic kidney disease     proteinuria    PSH:  Past Surgical History  Procedure Laterality Date  . Breast reduction surgery  1994  . Dermoid tumor  2000  . Esophagogastroduodenoscopy  07/01/2009    GTX:MIWOEH esphagus without barrett's/dilation with 16 mm/mild erthyema in the antrum. mild chronic gastritis on path.    Meds:  Current Outpatient Prescriptions  on File Prior to Encounter  Medication Sig Dispense Refill  . Blood Glucose Monitoring Suppl (ACCU-CHEK AVIVA PLUS) W/DEVICE KIT 1 kit to test blood sugars daily dx 250.00 1 kit 0  . HUMULIN N KWIKPEN 100 UNIT/ML Kiwkpen Inject 14-30 Units into the skin 2 (two) times daily. 30 units with breakfast and 14 units @HS     . insulin lispro (HUMALOG) 100 UNIT/ML injection Inject 14-19 Units into the skin 2 (two) times daily. 19units with Breakfast and 14units with Dinner    . loratadine (CLARITIN) 10 MG tablet Take 10 mg by mouth daily.    . metFORMIN (GLUCOPHAGE) 1000 MG tablet TAKE ONE TABLET BY MOUTH TWICE DAILY WITH A MEAL **DOSE  CHANGE** 60 tablet 5  . montelukast (SINGULAIR) 10 MG tablet Take 1 tablet (10 mg total) by mouth at bedtime. (Patient not taking: Reported on 10/18/2015) 30 tablet 3  . omeprazole (PRILOSEC) 20 MG capsule Take 1 capsule (20 mg total) by mouth daily. 90 capsule 3  . ondansetron (ZOFRAN) 4 MG tablet Take 1 tablet (4 mg total) by mouth every 8 (eight) hours as needed for nausea or vomiting. (Patient not taking: Reported on 10/18/2015) 30 tablet 11  . promethazine-dextromethorphan (PROMETHAZINE-DM) 6.25-15 MG/5ML syrup Take 5 mLs by mouth at bedtime as needed for cough. (Patient not taking: Reported on 05/28/2015) 118 mL 0   No current facility-administered medications on file prior to encounter.  Allergies:  Allergies  Allergen Reactions  . Peanut Oil Anaphylaxis  . Peanut-Containing Drug Products Anaphylaxis  . Ace Inhibitors Cough    Pt on no contaception and is at reproductive age, discussed with nephrology also  . Amoxicillin-Pot Clavulanate Nausea And Vomiting    Stomach cramps  . Invokana [Canagliflozin] Other (See Comments)     Worried about ketoacidosis  . Other     Tree nuts   Soc:  Social History   Social History  . Marital Status: Married    Spouse Name: N/A  . Number of Children: N/A  . Years of Education: N/A   Occupational History  .  [employer]    Social History Main Topics  . Smoking status: Never Smoker   . Smokeless tobacco: Never Used  . Alcohol Use: No  . Drug Use: No  . Sexual Activity:    Partners: Male    Birth Control/ Protection: Rhythm     Comment: Last Intercourse about 10 days ago   Other Topics Concern  . Not on file   Social History Narrative    Review of Systems: no vaginal bleeding or cramping/contractions, no LOF, no nausea/vomiting. All other systems reviewed and are negative.  PE:   Filed Vitals:   11/01/15 1436  BP: 127/74  Pulse: 101    A/P: 1) Single IUP at 21w 0d  2) Marginally controlled type 2 diabetes - insulin dose increased across the board.  New dose: 32 units NPH in AM, 26 units regular in AM; 22 units regular at dinner and 22 units NPH at night.  Recommend continued fasting and post prandial values and weekly follow up for now with primary OB/GYN.  Would increase insulin dose as needed to keep majority of fasting values in th 60-90 range and post prandial values < 130.  Would also recommend baseline 24-hr urine protein.  A urine dip is a poor assessment of renal function both in regard to her diabetes / suspected nephropathy and as a predictor for preeclampsia.  Concur with baby aspirin due to the patient's prior history of preeclampsia / HELLP syndrome.   Thank you for the opportunity to be a part of the care of Christina [name]. Please contact our office if we can be of further assistance.   I spent approximately 20 minutes with this patient with over 50% of time spent in face-to-face counseling.  Benjaman Lobe, MD Maternal Fetal Medicine

## 2015-11-06 ENCOUNTER — Other Ambulatory Visit (HOSPITAL_COMMUNITY): Payer: Self-pay

## 2015-11-07 ENCOUNTER — Inpatient Hospital Stay (HOSPITAL_COMMUNITY)
Admission: RE | Admit: 2015-11-07 | Discharge: 2015-11-07 | Disposition: A | Discharge status: Home / Self Care | Payer: 59 | Source: Ambulatory Visit | Attending: Obstetrics and Gynecology | Admitting: Obstetrics and Gynecology

## 2015-11-07 DIAGNOSIS — O24119 Pre-existing diabetes mellitus, type 2, in pregnancy, unspecified trimester: Secondary | ICD-10-CM

## 2015-11-07 NOTE — Progress Notes (Signed)
Patient failed for appointment. There was a miscommunication about need for today's appointment. Called patient to review glucose readings. FBS range 115-141, 2hpp breakfast 151-235, 2hpp lunch only one readings of 118, 2hpp dinner only one reading of 64mg /dl. She does not recall what she did to attempt to treat as it continued to decline to 49mg /dl.  I reviewed with Reynolds Bowl the need to test 4 times every day. I stressed the need for regular testing and that I was worried about the safety of her and her baby. Reviewed Rule of 15 for treating hypoglycemia.In consult with Dr. Lisbeth Renshaw we will adjust her insulin as follows:  NPH 36u in AM 26u HS. Regular 28AM No change to dinner regular, remains at 22u. Will need more complete glucose readings to properly modify insulin. Advised that if  she ever had any concern about her glucose she could call the office and report readings to nurse who would consult with physician. I will call Christina [name] next Thursday 11/14/15 in the afternoon to review glucose readings.

## 2015-12-05 LAB — HM DIABETES EYE EXAM

## 2016-01-02 DIAGNOSIS — O99019 Anemia complicating pregnancy, unspecified trimester: Secondary | ICD-10-CM | POA: Insufficient documentation

## 2016-01-09 ENCOUNTER — Encounter: Payer: Self-pay | Admitting: Gastroenterology

## 2016-02-07 ENCOUNTER — Ambulatory Visit (INDEPENDENT_AMBULATORY_CARE_PROVIDER_SITE_OTHER): Payer: 59 | Admitting: Gastroenterology

## 2016-02-07 ENCOUNTER — Encounter: Payer: Self-pay | Admitting: Gastroenterology

## 2016-02-07 VITALS — BP 120/80 | HR 108 | Temp 98.3°F | Ht 65.0 in | Wt 261.8 lb

## 2016-02-07 DIAGNOSIS — K3184 Gastroparesis: Secondary | ICD-10-CM | POA: Diagnosis not present

## 2016-02-07 DIAGNOSIS — K219 Gastro-esophageal reflux disease without esophagitis: Secondary | ICD-10-CM

## 2016-02-07 MED ORDER — OMEPRAZOLE 20 MG PO CPDR: 20.0000 mg | DELAYED_RELEASE_CAPSULE | Freq: Every day | ORAL | Status: DC

## 2016-02-07 NOTE — Patient Instructions (Signed)
I have refilled the Prilosec.   We will see you again in 2 years. We will refill the Prilosec again in 1 year.

## 2016-02-07 NOTE — Progress Notes (Signed)
Referring Provider: [name], MD Primary Care Physician:  Tula Nakayama, MD Primary GI: Dr. Oneida Alar   Chief Complaint  Patient presents with  . Follow-up    Gastroparesis much better    HPI:   Christina [name] is a 40 y.o. female presenting today with a history of gastroparesis, GERD. Here for routine follow-up. Denies nausea, vomiting, no dysphagia. Taking Prilosec once daily. No rectal bleeding. Pregnant and due in August. Doing well.   Past Medical History  Diagnosis Date  . GERD (gastroesophageal reflux disease)   . Bipolar disorder ([place])   . Gastroparesis   . 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 Hypertension     chronic on meds  . Diabetes mellitus     type 2 DM, insulin only needed during pregnancy  . Chronic kidney disease     proteinuria    Past Surgical History  Procedure Laterality Date  . Breast reduction surgery  1994  . Dermoid tumor  2000  . Esophagogastroduodenoscopy  07/01/2009    QZE:[id number] esphagus without barrett's/dilation with 16 mm/mild erthyema in the antrum. mild chronic gastritis on path.   . Trigger finger release  Nov 2016    Current Outpatient Prescriptions  Medication Sig Dispense Refill  . aspirin 81 MG tablet Take 81 mg by mouth daily.    Marland Kitchen docusate sodium (COLACE) 100 MG capsule Take 100 mg by mouth daily.    . ferrous sulfate 325 (65 FE) MG tablet Take 325 mg by mouth 2 (two) times daily with a meal.    . insulin lispro (HUMALOG) 100 UNIT/ML injection Inject into the skin 2 (two) times daily. 36 units at breakfast and 26 units at dinner    . loratadine (CLARITIN) 10 MG tablet Take 10 mg by mouth daily. Taking generic otc allergy    . metFORMIN (GLUCOPHAGE) 1000 MG tablet TAKE ONE TABLET BY MOUTH TWICE DAILY WITH A MEAL **DOSE  CHANGE** 60 tablet 5  . omeprazole (PRILOSEC) 20 MG capsule Take 1 capsule (20 mg total) by mouth daily. 90 capsule 3  . ondansetron (ZOFRAN) 4 MG tablet Take 1 tablet (4 mg total) by mouth every  8 (eight) hours as needed for nausea or vomiting. 30 tablet 11  . Prenatal Vit-Fe Fumarate-FA (MULTIVITAMIN-PRENATAL) 27-0.8 MG TABS tablet Take 1 tablet by mouth daily at 12 noon.    . Blood Glucose Monitoring Suppl (ACCU-CHEK AVIVA PLUS) W/DEVICE KIT 1 kit to test blood sugars daily dx 250.00 1 kit 0  . HUMULIN N KWIKPEN 100 UNIT/ML Kiwkpen Inject into the skin 2 (two) times daily. 54 units at breakfast  And 50 units at bedtime    . montelukast (SINGULAIR) 10 MG tablet Take 1 tablet (10 mg total) by mouth at bedtime. (Patient not taking: Reported on 10/18/2015) 30 tablet 3   No current facility-administered medications for this visit.    Allergies as of 02/07/2016 - Review Complete 02/07/2016  Allergen Reaction Noted  . 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 Peanut-containing drug products Anaphylaxis 02/08/2013  . Ace inhibitors Cough 10/10/2010  . Amoxicillin-pot clavulanate Nausea And Vomiting 04/07/2013  . Invokana [canagliflozin] Other (See Comments) 04/18/2014  . Other  06/13/2013    Family History  Problem Relation Age of Onset  . Diabetes Mother   . Hypertension Mother   . Fibromyalgia Mother   . Diabetes Father   . Hypertension Father   . Asthma Father   . Colon cancer Neg Hx  Social History   Social History  . Marital Status: Married    Spouse Name: N/A  . Number of Children: N/A  . Years of Education: N/A   Occupational History  . [employer]    Social History Main Topics  . Smoking status: Never Smoker   . Smokeless tobacco: Never Used     Comment: Never smoked   . Alcohol Use: No  . Drug Use: No  . Sexual Activity:    Partners: Male    Birth Control/ Protection: Rhythm     Comment: Last Intercourse about 10 days ago   Other Topics Concern  . None   Social History Narrative    Review of Systems: Negative unless mentioned in HPI   Physical Exam: BP 120/80 mmHg  Pulse 108  Temp(Src) 98.3 F (36.8 C) (Oral)  Ht 5' 5"   (1.651 m)  Wt 261 lb 12.8 oz (118.752 kg)  BMI 43.57 kg/m2  LMP 06/07/2015 General:   Alert and oriented. No distress noted. Pleasant and cooperative.  Head:  Normocephalic and atraumatic. Eyes:  Conjuctiva clear without scleral icterus. Mouth:  Oral mucosa pink and moist. Good dentition. No lesions. Abdomen:  +BS, round, consistent with pregnancy status. No guarding.  Extremities:  Without edema. Neurologic:  Alert and  oriented x4;  grossly normal neurologically. Psych:  Alert and cooperative. Normal mood and affect.

## 2016-02-10 NOTE — Assessment & Plan Note (Signed)
Doing well with daily Prilosec. Return in 2 years. Currently pregnant and due in August.

## 2016-02-10 NOTE — Assessment & Plan Note (Signed)
Without need for medication. Symptoms quiescent at this time. Continue PPI.

## 2016-02-11 NOTE — Progress Notes (Signed)
cc'ed to pcp °

## 2016-02-13 LAB — OB RESULTS CONSOLE GBS: GBS: POSITIVE

## 2016-03-04 ENCOUNTER — Other Ambulatory Visit: Payer: Self-pay | Admitting: Obstetrics and Gynecology

## 2016-03-04 ENCOUNTER — Encounter (HOSPITAL_COMMUNITY): Payer: Self-pay

## 2016-03-05 ENCOUNTER — Encounter (HOSPITAL_COMMUNITY)
Admission: RE | Admit: 2016-03-05 | Discharge: 2016-03-05 | Disposition: A | Discharge status: Home / Self Care | Payer: 59 | Source: Ambulatory Visit | Attending: Obstetrics and Gynecology | Admitting: Obstetrics and Gynecology

## 2016-03-05 HISTORY — DX: Anemia, unspecified: D64.9

## 2016-03-05 LAB — CBC
HCT: 33.4 % — ABNORMAL LOW (ref 36.0–46.0)
Hemoglobin: 10.8 g/dL — ABNORMAL LOW (ref 12.0–15.0)
MCH: 27.6 pg (ref 26.0–34.0)
MCHC: 32.3 g/dL (ref 30.0–36.0)
MCV: 85.4 fL (ref 78.0–100.0)
Platelets: 215 10*3/uL (ref 150–400)
RBC: 3.91 MIL/uL (ref 3.87–5.11)
RDW: 19.2 % — ABNORMAL HIGH (ref 11.5–15.5)
WBC: 6.9 10*3/uL (ref 4.0–10.5)

## 2016-03-05 LAB — BASIC METABOLIC PANEL
Anion gap: 9 (ref 5–15)
BUN: 9 mg/dL (ref 6–20)
CO2: 21 mmol/L — ABNORMAL LOW (ref 22–32)
Calcium: 9.3 mg/dL (ref 8.9–10.3)
Chloride: 104 mmol/L (ref 101–111)
Creatinine, Ser: 0.83 mg/dL (ref 0.44–1.00)
GFR calc Af Amer: >60 mL/min (ref >60)
GFR calc non Af Amer: >60 mL/min (ref >60)
Glucose, Bld: 143 mg/dL — ABNORMAL HIGH (ref 65–99)
Potassium: 3.7 mmol/L (ref 3.5–5.1)
Sodium: 134 mmol/L — ABNORMAL LOW (ref 135–145)

## 2016-03-05 NOTE — H&P (Signed)
Admission History and Physical Exam for an Obstetrics Patient  [name] is a 40 y.o. female, [id number], at [redacted] weeks gestation, who presents for cesarean delivery and tubal sterilization. She has been followed at the [facility] and Gynecology division of [facility].  Her pregnancy has been complicated by   Diabetes requiring insulin.  The patient is currently taking 62 units of NPH insulin in the morning and 38 units of regular insulin in the morning.  She takes 58 units of NPH insulin at night and 26 units of regular insulin at night.  Her sugars have been moderately well controlled.  Hypertension was treated prior to pregnancy.  Preeclampsia labs have been normal.  She is currently not on medication.  She did take baby aspirin during the pregnancy.  Advanced maternal age.  Her self repeat DNA test showed low risk for abnormality.  She is having a female infant.  Obesity.  The patient has had antenatal testing has all been reassuring.  Macrosomia.  An ultrasound showed an estimated fetal weight of 4466 g.  The patient was offered primary cesarean delivery as suggested by the [employer] of obstetricians and gynecologists.  Desires sterilization.  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  History of herpes virus.  See history below.  OB History    Gravida Para Term Preterm AB Living   3 2 1 1   2    SAB TAB Ectopic Multiple Live Births           2      Past Medical History:  Diagnosis Date  . Anemia   . Bipolar disorder ([place])   . Chronic kidney disease    proteinuria  . Diabetes mellitus    type 2 DM, insulin only needed during pregnancy  . Gastroparesis   . 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 GERD (gastroesophageal reflux disease)   . Hypertension    chronic on meds      Past Surgical History:  Procedure Laterality Date  . BREAST REDUCTION SURGERY  1994  . dermoid tumor  2000  . ESOPHAGOGASTRODUODENOSCOPY  07/01/2009   [id number] esphagus  without barrett's/dilation with 16 mm/mild erthyema in the antrum. mild chronic gastritis on path.   Theodoro Kalata FINGER RELEASE  Nov 2016    Allergies  Allergen Reactions  . Other Anaphylaxis    Tree nuts  . Peanut Oil Anaphylaxis  . Peanut-Containing Drug Products Anaphylaxis  . Ace Inhibitors Cough    Pt on no contaception and is at reproductive age, discussed with nephrology also  . Amoxicillin-Pot Clavulanate Nausea And Vomiting    Causes stomach cramps. Patient can take amoxicillin, not Augmentin.  Anastasio Auerbach [Canagliflozin] Other (See Comments)     Worried about ketoacidosis    Family History: family history includes Asthma in her father; Diabetes in her father and mother; Fibromyalgia in her mother; Heart disease in her father; Hypertension in her father and mother.  Social History:  reports that she has never smoked. She has never used smokeless tobacco. She reports that she does not drink alcohol or use drugs.  Review of systems: Normal pregnancy complaints.  Admission Physical Exam:    There is no height or weight on file to calculate BMI.  Last menstrual period 06/07/2015.  HEENT:                 Within normal limits Chest:  Clear Heart:                    Regular rate and rhythm Abdomen:             Gravid and nontender Extremities:          Grossly normal Neurologic exam: Grossly normal Pelvic exam:         Cervix: Closed and long  Prenatal labs: ABO, Rh:             --/--/A POS (08/03 1140) HBsAg:                 Negative (12/12 0000)  HIV:                       Non-reactive (12/12 0000)  GBS:                     Positive (07/13 0000) Antibody:              NEG (08/03 1140) Rubella:                immune RPR:                    Nonreactive (12/12 0000)      Prenatal Transfer Tool  Maternal Diabetes: Yes:  Diabetes Type:  Pre-pregnancy, Insulin/Medication controlled Genetic Screening: Normal Maternal Ultrasounds/Referrals: normal Fetal  Ultrasounds or other Referrals:  macrosomia Maternal Substance Abuse:  No Significant Maternal Medications:  NPH and regular insulin, valacyclovir, baby aspirin Significant Maternal Lab Results:  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  She understands the indications for her surgical procedure.  She accepts the risk of, but not limited to, anesthetic complications, bleeding, infections, possible damage to the surrounding organs, and possible tubal failure (17 per 1000).   Eli Hose 03/05/2016, 6:54 PM

## 2016-03-05 NOTE — Patient Instructions (Signed)
[place]  03/05/2016   Your procedure is scheduled on:  03/06/2016  Enter through the Main Entrance of [facility] [place] up the phone at the desk and dial [phone].   Call this number if you have problems the morning of surgery: [phone]   Remember:   Do not eat food:After Midnight.  Do not drink clear liquids: After Midnight.  Take these medicines the morning of surgery with A SIP OF WATER: take 24 units of insulin at bedtime, do not take metformin tonight or in the morning   Do not wear jewelry, make-up or nail polish.  Do not wear lotions, powders, or perfumes. You may wear deodorant.  Do not shave 48 hours prior to surgery.  Do not bring valuables to the hospital.  [facility] is not   responsible for any belongings or valuables brought to the hospital.  Contacts, dentures or bridgework may not be worn into surgery.  Leave suitcase in the car. After surgery it may be brought to your room.  For patients admitted to the hospital, checkout time is 11:00 AM the day of              discharge.   Patients discharged the day of surgery will not be allowed to drive             home.  Name and phone number of your driver: na  Special Instructions:   N/A   Please read over the following fact sheets that you were given:   Surgical Site Infection Prevention

## 2016-03-06 ENCOUNTER — Inpatient Hospital Stay (HOSPITAL_COMMUNITY): Payer: 59 | Admitting: Anesthesiology

## 2016-03-06 ENCOUNTER — Inpatient Hospital Stay (HOSPITAL_COMMUNITY)
Admission: AD | Admit: 2016-03-06 | Discharge: 2016-03-09 | DRG: 765 | Disposition: A | Discharge status: Home / Self Care | Payer: 59 | Source: Ambulatory Visit | Attending: Obstetrics and Gynecology | Admitting: Obstetrics and Gynecology

## 2016-03-06 ENCOUNTER — Inpatient Hospital Stay (HOSPITAL_COMMUNITY)
Admission: AD | Disposition: A | Discharge status: Home / Self Care | Payer: 59 | Source: Ambulatory Visit | Attending: Obstetrics and Gynecology

## 2016-03-06 ENCOUNTER — Encounter (HOSPITAL_COMMUNITY): Payer: Self-pay

## 2016-03-06 DIAGNOSIS — Z6841 Body Mass Index (BMI) 40.0 and over, adult: Secondary | ICD-10-CM

## 2016-03-06 DIAGNOSIS — O9962 Diseases of the digestive system complicating childbirth: Secondary | ICD-10-CM | POA: Diagnosis present

## 2016-03-06 DIAGNOSIS — E1122 Type 2 diabetes mellitus with diabetic chronic kidney disease: Secondary | ICD-10-CM | POA: Diagnosis present

## 2016-03-06 DIAGNOSIS — F319 Bipolar disorder, unspecified: Secondary | ICD-10-CM | POA: Diagnosis present

## 2016-03-06 DIAGNOSIS — N189 Chronic kidney disease, unspecified: Secondary | ICD-10-CM | POA: Diagnosis present

## 2016-03-06 DIAGNOSIS — O3663X0 Maternal care for excessive fetal growth, third trimester, not applicable or unspecified: Secondary | ICD-10-CM | POA: Diagnosis present

## 2016-03-06 DIAGNOSIS — O9832 Other infections with a predominantly sexual mode of transmission complicating childbirth: Secondary | ICD-10-CM | POA: Diagnosis present

## 2016-03-06 DIAGNOSIS — Z8249 Family history of ischemic heart disease and other diseases of the circulatory system: Secondary | ICD-10-CM

## 2016-03-06 DIAGNOSIS — I129 Hypertensive chronic kidney disease with stage 1 through stage 4 chronic kidney disease, or unspecified chronic kidney disease: Secondary | ICD-10-CM | POA: Diagnosis present

## 2016-03-06 DIAGNOSIS — Z302 Encounter for sterilization: Secondary | ICD-10-CM

## 2016-03-06 DIAGNOSIS — Z794 Long term (current) use of insulin: Secondary | ICD-10-CM | POA: Diagnosis not present

## 2016-03-06 DIAGNOSIS — O2412 Pre-existing diabetes mellitus, type 2, in childbirth: Principal | ICD-10-CM | POA: Diagnosis present

## 2016-03-06 DIAGNOSIS — D62 Acute posthemorrhagic anemia: Secondary | ICD-10-CM | POA: Diagnosis present

## 2016-03-06 DIAGNOSIS — O99344 Other mental disorders complicating childbirth: Secondary | ICD-10-CM | POA: Diagnosis present

## 2016-03-06 DIAGNOSIS — K219 Gastro-esophageal reflux disease without esophagitis: Secondary | ICD-10-CM | POA: Diagnosis present

## 2016-03-06 DIAGNOSIS — O9081 Anemia of the puerperium: Secondary | ICD-10-CM | POA: Diagnosis present

## 2016-03-06 DIAGNOSIS — Z3A39 39 weeks gestation of pregnancy: Secondary | ICD-10-CM

## 2016-03-06 DIAGNOSIS — O99214 Obesity complicating childbirth: Secondary | ICD-10-CM | POA: Diagnosis present

## 2016-03-06 DIAGNOSIS — Z833 Family history of diabetes mellitus: Secondary | ICD-10-CM

## 2016-03-06 DIAGNOSIS — A6000 Herpesviral infection of urogenital system, unspecified: Secondary | ICD-10-CM | POA: Diagnosis present

## 2016-03-06 DIAGNOSIS — O10213 Pre-existing hypertensive chronic kidney disease complicating pregnancy, third trimester: Secondary | ICD-10-CM | POA: Diagnosis present

## 2016-03-06 HISTORY — PX: TUBAL LIGATION: SHX77

## 2016-03-06 LAB — CBC
HCT: 33 % — ABNORMAL LOW (ref 36.0–46.0)
Hemoglobin: 10.8 g/dL — ABNORMAL LOW (ref 12.0–15.0)
MCH: 27.8 pg (ref 26.0–34.0)
MCHC: 32.7 g/dL (ref 30.0–36.0)
MCV: 85.1 fL (ref 78.0–100.0)
Platelets: 229 K/uL (ref 150–400)
RBC: 3.88 MIL/uL (ref 3.87–5.11)
RDW: 19.2 % — ABNORMAL HIGH (ref 11.5–15.5)
WBC: 11.1 K/uL — ABNORMAL HIGH (ref 4.0–10.5)

## 2016-03-06 LAB — PREPARE RBC (CROSSMATCH)

## 2016-03-06 LAB — GLUCOSE, CAPILLARY
Glucose-Capillary: 118 mg/dL — ABNORMAL HIGH (ref 65–99)
Glucose-Capillary: 105 mg/dL — ABNORMAL HIGH (ref 65–99)
Glucose-Capillary: 129 mg/dL — ABNORMAL HIGH (ref 65–99)
Glucose-Capillary: 219 mg/dL — ABNORMAL HIGH (ref 65–99)
Glucose-Capillary: 141 mg/dL — ABNORMAL HIGH (ref 65–99)

## 2016-03-06 LAB — CREATININE, SERUM
Creatinine, Ser: 0.7 mg/dL (ref 0.44–1.00)
GFR calc Af Amer: >60 mL/min (ref >60)
GFR calc non Af Amer: >60 mL/min (ref >60)

## 2016-03-06 LAB — GLUCOSE, RANDOM: Glucose, Bld: 120 mg/dL — ABNORMAL HIGH (ref 65–99)

## 2016-03-06 LAB — RPR: RPR Ser Ql: NONREACTIVE

## 2016-03-06 SURGERY — Surgical Case
Anesthesia: Spinal | Wound class: Clean Contaminated

## 2016-03-06 MED ORDER — ONDANSETRON HCL 4 MG/2ML IJ SOLN
INTRAMUSCULAR | Status: AC
  Filled 2016-03-06: qty 2

## 2016-03-06 MED ORDER — ACETAMINOPHEN 325 MG PO TABS
650.0000 mg | ORAL_TABLET | ORAL | Status: DC | PRN
  Administered 2016-03-06 – 2016-03-08 (×9): 650 mg via ORAL
  Filled 2016-03-06 (×10): qty 2

## 2016-03-06 MED ORDER — BUPIVACAINE-EPINEPHRINE 0.5% -1:200000 IJ SOLN
INTRAMUSCULAR | Status: DC | PRN
  Administered 2016-03-06: 20 mL

## 2016-03-06 MED ORDER — INSULIN NPH (HUMAN) (ISOPHANE) 100 UNIT/ML ~~LOC~~ SUSP
30.0000 [IU] | Freq: Two times a day (BID) | SUBCUTANEOUS | Status: DC
Start: 2016-03-06 — End: 2016-03-07
  Administered 2016-03-06 – 2016-03-07 (×2): 30 [IU] via SUBCUTANEOUS
  Filled 2016-03-06: qty 10

## 2016-03-06 MED ORDER — INSULIN REGULAR HUMAN 100 UNIT/ML IJ SOLN
16.0000 [IU] | Freq: Every day | INTRAMUSCULAR | Status: DC
Start: 2016-03-06 — End: 2016-03-07
  Administered 2016-03-06: 16 [IU] via SUBCUTANEOUS
  Filled 2016-03-06: qty 0.16

## 2016-03-06 MED ORDER — BUPIVACAINE HCL (PF) 0.75 % IJ SOLN
INTRAMUSCULAR | Status: DC | PRN
  Administered 2016-03-06: 12 mg via INTRATHECAL

## 2016-03-06 MED ORDER — HYDROMORPHONE HCL 2 MG PO TABS
2.0000 mg | ORAL_TABLET | ORAL | Status: DC | PRN
  Administered 2016-03-07 – 2016-03-08 (×9): 2 mg via ORAL
  Filled 2016-03-06 (×9): qty 1

## 2016-03-06 MED ORDER — DIPHENHYDRAMINE HCL 25 MG PO CAPS: 25.0000 mg | ORAL_CAPSULE | Freq: Four times a day (QID) | ORAL | Status: DC | PRN

## 2016-03-06 MED ORDER — SIMETHICONE 80 MG PO CHEW
80.0000 mg | CHEWABLE_TABLET | ORAL | Status: DC
  Administered 2016-03-06 – 2016-03-08 (×3): 80 mg via ORAL
  Filled 2016-03-06 (×2): qty 1

## 2016-03-06 MED ORDER — SIMETHICONE 80 MG PO CHEW: 80.0000 mg | CHEWABLE_TABLET | ORAL | Status: DC | PRN

## 2016-03-06 MED ORDER — FENTANYL CITRATE (PF) 100 MCG/2ML IJ SOLN
INTRAMUSCULAR | Status: DC | PRN
  Administered 2016-03-06: 25 ug via INTRAVENOUS
  Administered 2016-03-06: 35 ug via INTRAVENOUS
  Administered 2016-03-06: 50 ug via INTRAVENOUS
  Administered 2016-03-06: 15 ug via INTRATHECAL
  Administered 2016-03-06: 25 ug via INTRAVENOUS

## 2016-03-06 MED ORDER — ENOXAPARIN SODIUM 40 MG/0.4ML ~~LOC~~ SOLN
40.0000 mg | SUBCUTANEOUS | Status: DC
  Administered 2016-03-07 – 2016-03-09 (×3): 40 mg via SUBCUTANEOUS
  Filled 2016-03-06 (×3): qty 0.4

## 2016-03-06 MED ORDER — SIMETHICONE 80 MG PO CHEW
80.0000 mg | CHEWABLE_TABLET | Freq: Three times a day (TID) | ORAL | Status: DC
  Administered 2016-03-06 – 2016-03-09 (×8): 80 mg via ORAL
  Filled 2016-03-06 (×9): qty 1

## 2016-03-06 MED ORDER — PHENYLEPHRINE 8 MG IN D5W 100 ML (0.08MG/ML) PREMIX OPTIME
INJECTION | INTRAVENOUS | Status: DC | PRN
  Administered 2016-03-06: 60 ug/min via INTRAVENOUS

## 2016-03-06 MED ORDER — TETANUS-DIPHTH-ACELL PERTUSSIS 5-2.5-18.5 LF-MCG/0.5 IM SUSP: 0.5000 mL | Freq: Once | INTRAMUSCULAR | Status: DC

## 2016-03-06 MED ORDER — FENTANYL CITRATE (PF) 100 MCG/2ML IJ SOLN
INTRAMUSCULAR | Status: AC
  Filled 2016-03-06: qty 2

## 2016-03-06 MED ORDER — IBUPROFEN 600 MG PO TABS
600.0000 mg | ORAL_TABLET | Freq: Four times a day (QID) | ORAL | Status: DC
  Administered 2016-03-06 – 2016-03-09 (×11): 600 mg via ORAL
  Filled 2016-03-06 (×11): qty 1

## 2016-03-06 MED ORDER — LACTATED RINGERS IV SOLN
INTRAVENOUS | Status: DC
  Administered 2016-03-07: 02:00:00 via INTRAVENOUS

## 2016-03-06 MED ORDER — LACTATED RINGERS IV SOLN
Freq: Once | INTRAVENOUS | Status: AC
  Administered 2016-03-06: 1000 mL/h via INTRAVENOUS

## 2016-03-06 MED ORDER — MEASLES, MUMPS & RUBELLA VAC ~~LOC~~ INJ: 0.5000 mL | INJECTION | Freq: Once | SUBCUTANEOUS | Status: DC

## 2016-03-06 MED ORDER — MEDROXYPROGESTERONE ACETATE 150 MG/ML IM SUSP: 150.0000 mg | INTRAMUSCULAR | Status: DC | PRN

## 2016-03-06 MED ORDER — LACTATED RINGERS IV SOLN
INTRAVENOUS | Status: DC | PRN
  Administered 2016-03-06: 11:00:00 via INTRAVENOUS

## 2016-03-06 MED ORDER — OXYTOCIN 10 UNIT/ML IJ SOLN
INTRAMUSCULAR | Status: AC
  Filled 2016-03-06: qty 4

## 2016-03-06 MED ORDER — INSULIN REGULAR HUMAN 100 UNIT/ML IJ SOLN
22.0000 [IU] | Freq: Every day | INTRAMUSCULAR | Status: DC
  Administered 2016-03-07: 22 [IU] via SUBCUTANEOUS
  Filled 2016-03-06: qty 0.22

## 2016-03-06 MED ORDER — ONDANSETRON HCL 4 MG/2ML IJ SOLN
INTRAMUSCULAR | Status: DC | PRN
  Administered 2016-03-06: 4 mg via INTRAVENOUS

## 2016-03-06 MED ORDER — DEXTROSE 5 % IV SOLN
3.0000 g | INTRAVENOUS | Status: AC
  Administered 2016-03-06: 3 g via INTRAVENOUS
  Filled 2016-03-06: qty 3000

## 2016-03-06 MED ORDER — LACTATED RINGERS IV SOLN
INTRAVENOUS | Status: DC | PRN
  Administered 2016-03-06: 40 [IU] via INTRAVENOUS

## 2016-03-06 MED ORDER — BUPIVACAINE-EPINEPHRINE (PF) 0.5% -1:200000 IJ SOLN
INTRAMUSCULAR | Status: AC
  Filled 2016-03-06: qty 30

## 2016-03-06 MED ORDER — DIBUCAINE 1 % RE OINT: 1.0000 "application " | TOPICAL_OINTMENT | RECTAL | Status: DC | PRN

## 2016-03-06 MED ORDER — SCOPOLAMINE 1 MG/3DAYS TD PT72
MEDICATED_PATCH | TRANSDERMAL | Status: AC
Start: 2016-03-06 — End: 2016-03-06
  Filled 2016-03-06: qty 1

## 2016-03-06 MED ORDER — DEXAMETHASONE SODIUM PHOSPHATE 10 MG/ML IJ SOLN
INTRAMUSCULAR | Status: DC | PRN
  Administered 2016-03-06: 8 mg via INTRAVENOUS

## 2016-03-06 MED ORDER — PRENATAL MULTIVITAMIN CH
1.0000 | ORAL_TABLET | Freq: Every day | ORAL | Status: DC
  Administered 2016-03-07 – 2016-03-08 (×2): 1 via ORAL
  Filled 2016-03-06 (×2): qty 1

## 2016-03-06 MED ORDER — FENTANYL CITRATE (PF) 100 MCG/2ML IJ SOLN
25.0000 ug | INTRAMUSCULAR | Status: DC | PRN
  Administered 2016-03-06 (×2): 50 ug via INTRAVENOUS

## 2016-03-06 MED ORDER — MORPHINE SULFATE-NACL 0.5-0.9 MG/ML-% IV SOSY
PREFILLED_SYRINGE | INTRAVENOUS | Status: AC
  Filled 2016-03-06: qty 1

## 2016-03-06 MED ORDER — LACTATED RINGERS IV SOLN: INTRAVENOUS | Status: DC

## 2016-03-06 MED ORDER — ZOLPIDEM TARTRATE 5 MG PO TABS: 5.0000 mg | ORAL_TABLET | Freq: Every evening | ORAL | Status: DC | PRN

## 2016-03-06 MED ORDER — DEXAMETHASONE SODIUM PHOSPHATE 10 MG/ML IJ SOLN
INTRAMUSCULAR | Status: AC
  Filled 2016-03-06: qty 1

## 2016-03-06 MED ORDER — PHENYLEPHRINE 8 MG IN D5W 100 ML (0.08MG/ML) PREMIX OPTIME
INJECTION | INTRAVENOUS | Status: AC
  Filled 2016-03-06: qty 100

## 2016-03-06 MED ORDER — SENNOSIDES-DOCUSATE SODIUM 8.6-50 MG PO TABS
2.0000 | ORAL_TABLET | ORAL | Status: DC
  Administered 2016-03-06 – 2016-03-08 (×3): 2 via ORAL
  Filled 2016-03-06 (×3): qty 2

## 2016-03-06 MED ORDER — MORPHINE SULFATE (PF) 0.5 MG/ML IJ SOLN
INTRAMUSCULAR | Status: DC | PRN
  Administered 2016-03-06: .2 mg via INTRATHECAL
  Administered 2016-03-06: .3 mg via INTRAVENOUS

## 2016-03-06 MED ORDER — WITCH HAZEL-GLYCERIN EX PADS: 1.0000 "application " | MEDICATED_PAD | CUTANEOUS | Status: DC | PRN

## 2016-03-06 MED ORDER — OXYTOCIN 40 UNITS IN LACTATED RINGERS INFUSION - SIMPLE MED: 2.5000 [IU]/h | INTRAVENOUS | Status: AC

## 2016-03-06 MED ORDER — BUPIVACAINE IN DEXTROSE 0.75-8.25 % IT SOLN
INTRATHECAL | Status: AC
Start: 2016-03-06 — End: 2016-03-06
  Filled 2016-03-06: qty 2

## 2016-03-06 MED ORDER — LACTATED RINGERS IV SOLN
INTRAVENOUS | Status: DC
  Administered 2016-03-06 (×3): via INTRAVENOUS

## 2016-03-06 MED ORDER — SCOPOLAMINE 1 MG/3DAYS TD PT72
1.0000 | MEDICATED_PATCH | Freq: Once | TRANSDERMAL | Status: AC
  Administered 2016-03-06: 1.5 mg via TRANSDERMAL

## 2016-03-06 MED ORDER — MENTHOL 3 MG MT LOZG: 1.0000 | LOZENGE | OROMUCOSAL | Status: DC | PRN

## 2016-03-06 SURGICAL SUPPLY — 38 items
CHLORAPREP W/TINT 26ML (MISCELLANEOUS) ×3 IMPLANT
CLAMP CORD UMBIL (MISCELLANEOUS) IMPLANT
CLOTH BEACON ORANGE TIMEOUT ST (SAFETY) ×3 IMPLANT
CONTAINER PREFILL 10% NBF 15ML (MISCELLANEOUS) IMPLANT
DRAIN JACKSON PRT FLT 7MM (DRAIN) IMPLANT
DRSG OPSITE POSTOP 4X10 (GAUZE/BANDAGES/DRESSINGS) ×3 IMPLANT
ELECT REM PT RETURN 9FT ADLT (ELECTROSURGICAL) ×3
ELECTRODE REM PT RTRN 9FT ADLT (ELECTROSURGICAL) ×2 IMPLANT
EVACUATOR SILICONE 100CC (DRAIN) IMPLANT
EXTRACTOR VACUUM M CUP 4 TUBE (SUCTIONS) IMPLANT
GLOVE BIOGEL PI IND STRL 7.0 (GLOVE) ×4 IMPLANT
GLOVE BIOGEL PI IND STRL 8.5 (GLOVE) ×2 IMPLANT
GLOVE BIOGEL PI INDICATOR 7.0 (GLOVE) ×2
GLOVE BIOGEL PI INDICATOR 8.5 (GLOVE) ×1
GLOVE ECLIPSE 8.0 STRL XLNG CF (GLOVE) ×6 IMPLANT
GOWN STRL REUS W/TWL LRG LVL3 (GOWN DISPOSABLE) ×9 IMPLANT
KIT ABG SYR 3ML LUER SLIP (SYRINGE) IMPLANT
NEEDLE HYPO 22GX1.5 SAFETY (NEEDLE) ×3 IMPLANT
NEEDLE HYPO 25X5/8 SAFETYGLIDE (NEEDLE) IMPLANT
NS IRRIG 1000ML POUR BTL (IV SOLUTION) ×3 IMPLANT
PACK C SECTION WH (CUSTOM PROCEDURE TRAY) ×3 IMPLANT
PAD ABD 7.5X8 STRL (GAUZE/BANDAGES/DRESSINGS) ×3 IMPLANT
PAD OB MATERNITY 4.3X12.25 (PERSONAL CARE ITEMS) ×3 IMPLANT
PENCIL SMOKE EVAC W/HOLSTER (ELECTROSURGICAL) ×3 IMPLANT
SUT MNCRL AB 3-0 PS2 27 (SUTURE) IMPLANT
SUT PLAIN 0 NONE (SUTURE) IMPLANT
SUT SILK 3 0 FS 1X18 (SUTURE) IMPLANT
SUT VIC AB 0 CT1 27 (SUTURE) ×3
SUT VIC AB 0 CT1 27XBRD ANBCTR (SUTURE) ×6 IMPLANT
SUT VIC AB 0 CT1 36 (SUTURE) ×3 IMPLANT
SUT VIC AB 2-0 CTX 36 (SUTURE) ×9 IMPLANT
SUT VIC AB 3-0 CT1 27 (SUTURE)
SUT VIC AB 3-0 CT1 TAPERPNT 27 (SUTURE) IMPLANT
SUT VIC AB 3-0 SH 27 (SUTURE)
SUT VIC AB 3-0 SH 27X BRD (SUTURE) IMPLANT
SYR CONTROL 10ML LL (SYRINGE) ×3 IMPLANT
TOWEL OR 17X24 6PK STRL BLUE (TOWEL DISPOSABLE) ×3 IMPLANT
TRAY FOLEY CATH SILVER 14FR (SET/KITS/TRAYS/PACK) ×3 IMPLANT

## 2016-03-06 NOTE — Transfer of Care (Signed)
Immediate Anesthesia Transfer of Care Note  Patient: Christina [name]  Procedure(s) Performed: Procedure(s): CESAREAN SECTION (N/A) BILATERAL TUBAL LIGATION (Bilateral)  Patient Location: PACU  Anesthesia Type:Spinal  Level of Consciousness: awake, alert  and oriented  Airway & Oxygen Therapy: Patient Spontanous Breathing  Post-op Assessment: Report given to RN and Post -op Vital signs reviewed and stable  Post vital signs: Reviewed and stable  Last Vitals:  Vitals:   03/06/16 0841  BP: (!) 134/94  Pulse: (!) 106  Resp: (!) 6  Temp: 36.9 C    Last Pain:  Vitals:   03/06/16 0841  TempSrc: Oral  PainSc: 4       Patients Stated Pain Goal: 4 ([id number])  Complications: No apparent anesthesia complications

## 2016-03-06 NOTE — Progress Notes (Signed)
The patient was interviewed and examined today.  The previously documented history and physical examination was reviewed. There are no changes. The operative procedure was reviewed. The risks and benefits were outlined again. The specific risks include, but are not limited to, anesthetic complications, bleeding, infections, and possible damage to the surrounding organs. The patient's questions were answered.  We are ready to proceed as outlined. The likelihood of the patient achieving the goals of this procedure is very likely.   BP (!) 134/94   Pulse (!) 106   Temp 98.5 F (36.9 C) (Oral)   Resp (!) 6   LMP 06/07/2015   SpO2 99%   CBC    Component Value Date/Time   WBC 6.9 03/05/2016 1140   RBC 3.91 03/05/2016 1140   HGB 10.8 (L) 03/05/2016 1140   HGB 11.2 10/25/2012   HCT 33.4 (L) 03/05/2016 1140   HCT 34 10/25/2012   PLT 215 03/05/2016 1140   MCV 85.4 03/05/2016 1140   MCH 27.6 03/05/2016 1140   MCHC 32.3 03/05/2016 1140   RDW 19.2 (H) 03/05/2016 1140   LYMPHSABS 2.1 09/19/2014 1112   MONOABS 0.5 09/19/2014 1112   EOSABS 0.1 09/19/2014 1112   BASOSABS 0.1 09/19/2014 1112    BS = 118.  Gildardo Cranker, M.D.

## 2016-03-06 NOTE — Lactation Note (Signed)
This note was copied from a baby's chart. Lactation Consultation Note  Patient Name: Christina [name] [id number] Date: 03/06/2016 Reason for consult: Initial assessment  Mom w/a hx of breast reduction in 1994. Mom reports that her milk came to volume on day 4 with her 1st child, but did not come to volume for 1 week w/her 2nd child. Mom reports that her 1st baby did not latch on & she cannot remember how long she lactated. Mom nursed her 2nd baby for some weeks, but had to use a nipple shield and needed to supplement b/c she did not have a full supply. Mom reports that w/her 2nd baby the most she could ever pump was 2 oz/session, but that was not typical.  While I was in room, infant was rooting and trying to repeatedly latch to Mom's L breast. Her nipple was flat & the infant could not latch with the teacup hold. A nipple shield (size 24) was applied & infant latched, but did not maintain continuous suckling nor seemed to be transferring any milk. At that time, the RN from nursery walked in. Infant's CBG was 27 & infant needed to be supplemented. Mom amenable w/supplementing w/a bottle.   Mom reports + breast changes w/pregnancy. I did not express any colostrum while trying to do hand expression w/Mom.   Matthias Hughs [facility] 03/06/2016, 6:36 PM

## 2016-03-06 NOTE — Anesthesia Procedure Notes (Addendum)
Epidural Patient location during procedure: OB Start time: 03/06/2016 10:00 AM End time: 03/06/2016 10:07 AM  Staffing Anesthesiologist: Rod Mae Performed: anesthesiologist   Preanesthetic Checklist Completed: patient identified, site marked, surgical consent, pre-op evaluation, timeout performed, IV checked, risks and benefits discussed and monitors and equipment checked  Epidural Patient position: sitting Prep: site prepped and draped and DuraPrep Patient monitoring: continuous pulse ox and blood pressure Approach: midline Location: L3-L4 Injection technique: LOR saline  Needle:  Needle type: Tuohy  Needle gauge: 17 G Needle length: 9 cm and 9 Needle insertion depth: 6 cm Catheter type: closed end flexible Catheter size: 19 Gauge Catheter at skin depth: 10 cm Test dose: negative  Assessment Sensory level: T4 Events: blood not aspirated, injection not painful, no injection resistance, negative IV test and no paresthesia  Additional Notes Patient identified. Risks/Benefits/Options discussed with patient including but not limited to bleeding, infection, nerve damage, paralysis, failed block, incomplete pain control, headache, blood pressure changes, nausea, vomiting, reactions to medication both or allergic, itching and postpartum back pain. Confirmed with bedside nurse the patient's most recent platelet count. Confirmed with patient that they are not currently taking any anticoagulation, have any bleeding history or any family history of bleeding disorders. Patient expressed understanding and wished to proceed. All questions were answered. Sterile technique was used throughout the entire procedure. Please see nursing notes for vital signs. Test dose was given through epidural catheter and negative prior to continuing to dose epidural or start infusion. Warning signs of high block given to the patient including shortness of breath, tingling/numbness in hands, complete motor  block, or any concerning symptoms with instructions to call for help. Patient was given instructions on fall risk and not to get out of bed. All questions and concerns addressed with instructions to call with any issues or inadequate analgesia.  This was CSE with 27 gauge needle.

## 2016-03-06 NOTE — Anesthesia Preprocedure Evaluation (Signed)
Anesthesia Evaluation  Patient identified by MRN, date of birth, ID band Patient awake    Reviewed: Allergy & Precautions, H&P , NPO status , Patient's Chart, lab work & pertinent test results, reviewed documented beta blocker date and time   Airway Mallampati: II  TM Distance: >3 FB Neck ROM: full    Dental no notable dental hx. (+) Dental Advisory Given, Teeth Intact   Pulmonary neg pulmonary ROS,    Pulmonary exam normal breath sounds clear to auscultation       Cardiovascular Exercise Tolerance: Good hypertension, Normal cardiovascular exam Rhythm:regular Rate:Normal     Neuro/Psych Bipolar Disorder negative neurological ROS     GI/Hepatic negative GI ROS, Neg liver ROS,   Endo/Other  diabetes, Well Controlled, Type 2, Insulin DependentMorbid obesity  Renal/GU negative Renal ROS  negative genitourinary   Musculoskeletal   Abdominal   Peds  Hematology negative hematology ROS (+)   Anesthesia Other Findings   Reproductive/Obstetrics (+) Pregnancy                             Anesthesia Physical Anesthesia Plan  ASA: III  Anesthesia Plan: Spinal   Post-op Pain Management:    Induction:   Airway Management Planned:   Additional Equipment:   Intra-op Plan:   Post-operative Plan:   Informed Consent: I have reviewed the patients History and Physical, chart, labs and discussed the procedure including the risks, benefits and alternatives for the proposed anesthesia with the patient or authorized representative who has indicated his/her understanding and acceptance.   Dental Advisory Given  Plan Discussed with: CRNA  Anesthesia Plan Comments:         Anesthesia Quick Evaluation

## 2016-03-06 NOTE — Addendum Note (Signed)
Addendum  created 03/06/16 1622 by Hewitt Blade, CRNA   Sign clinical note

## 2016-03-06 NOTE — Op Note (Signed)
OPERATIVE NOTE  Patient's Name: Christina [name]  Date of Birth: September 24, 1975  Medical Records Number: [id number]  Date of Operation: 03/06/2016  Preoperative diagnosis:  [redacted]w[redacted]d weeks gestation  Gestational Diabetes  Macrosomia  Hypertension  Obesity  Anemia  Advanced maternal age  40 sterilization  Postoperative diagnosis:  Same  Procedure:  Primary low transverse cesarean section  Bilateral tubal sterilization procedure  Surgeon:  Gildardo Cranker, M.D.  Assistant:  Trellis Moment, RNFA  Anesthesia:  Regional  Disposition:  Christina [name] is a 40 y.o. female, [id number], who presents at [redacted]w[redacted]d weeks gestation. The patient has been followed at the [facility] obstetrics and gynecology division [facility]. She has the above mentioned diagnosis. She understands the indications for her procedure and she accepts the risk of, but not limited to, anesthetic complications, bleeding, infections, and possible damage to the surrounding organs.  Findings:  A 10 pound 8 ounce female [name]) was delivered from a OP position.  The Apgar scores were 9/9 . The uterus, fallopian tubes, and ovaries were normal for the gravid state.  Procedure:  The patient was taken to the operating room where a spinal anesthetic was given. The patient's abdomen was prepped with Chloraprep. The perineum was prepped with betadine. A Foley catheter was placed in the bladder. The patient was sterilely draped. A "timeout" was performed which properly identified the patient and the correct operative procedure. The lower abdomen was injected with half percent Marcaine with epinephrine. A low transverse incision was made in the abdomen and carried sharply through the subcutaneous tissue, the fascia, and the anterior peritoneum. An incision was made in the lower uterine segment. The incision was extended in a low transverse fashion. The membranes were ruptured. The fetal head was  delivered with a vacuum extractor. The mouth and nose were suctioned. The remainder of the infant was then delivered. The cord was clamped and cut. The infant was handed to the awaiting pediatric team. The placenta was removed. The uterine cavity was cleaned of amniotic fluid, clotted blood, and membranes. The uterine incision was closed using a running locking suture of 2-0 Vicryl. An imbricating suture of 2-0 Vicryl was placed. The left fallopian tube was identified. It was followed to its fimbriated end. A knuckle of tube was made on the left using 2 free ties of 0 plain catgut. The knuckle of tube thus made was excised. Hemostasis was adequate. An identical procedure was carried out on the opposite side. Again hemostasis was adequate. The pelvis was vigorously irrigated. Hemostasis was adequate. The anterior peritoneum and the abdominal musculature were closed using 2-0 Vicryl. The fascia was closed using a running suture of 0 Vicryl followed by 3 interrupted sutures of 0 Vicryl. The subcutaneous layer was closed using interrupted sutures of 0 Vicryl. The skin was reapproximated using a subcuticular suture of 3-0 Monocryl. Sponge, needle, and instrument counts were correct on 2 occasions. The estimated blood loss for the procedure was 800 cc. The patient tolerated her procedure well. She was transported to the recovery room in stable condition. The infant was taken to the full-term nursery in stable condition. The placenta was sent to labor and delivery.  Gildardo Cranker, M.D.

## 2016-03-06 NOTE — Anesthesia Postprocedure Evaluation (Signed)
Anesthesia Post Note  Patient: Christina [name]  Procedure(s) Performed: Procedure(s) (LRB): CESAREAN SECTION (N/A) BILATERAL TUBAL LIGATION (Bilateral)  Patient location during evaluation: SICU Anesthesia Type: Spinal Level of consciousness: awake and alert Pain management: pain level controlled Vital Signs Assessment: post-procedure vital signs reviewed and stable Respiratory status: spontaneous breathing and nonlabored ventilation Cardiovascular status: stable Postop Assessment: no headache, patient able to bend at knees, no backache, no signs of nausea or vomiting, adequate PO intake and spinal receding Anesthetic complications: no     Last Vitals:  Vitals:   03/06/16 1325 03/06/16 1414  BP: 116/78 119/66  Pulse: 76 79  Resp: 18 20  Temp: 36.8 C 36.6 C    Last Pain:  Vitals:   03/06/16 1414  TempSrc: Oral  PainSc:    Pain Goal: Patients Stated Pain Goal: 4 (03/06/16 0841)               Jabier Mutton

## 2016-03-06 NOTE — Anesthesia Postprocedure Evaluation (Signed)
Anesthesia Post Note  Patient: Christina [name]  Procedure(s) Performed: Procedure(s) (LRB): CESAREAN SECTION (N/A) BILATERAL TUBAL LIGATION (Bilateral)  Patient location during evaluation: PACU Anesthesia Type: Spinal Level of consciousness: oriented and awake and alert Pain management: pain level controlled Vital Signs Assessment: post-procedure vital signs reviewed and stable Respiratory status: spontaneous breathing, respiratory function stable and patient connected to nasal cannula oxygen Cardiovascular status: blood pressure returned to baseline and stable Postop Assessment: no headache and no backache Anesthetic complications: no     Last Vitals:  Vitals:   03/06/16 1300 03/06/16 1325  BP:  116/78  Pulse: 95 76  Resp:  18  Temp:  36.8 C    Last Pain:  Vitals:   03/06/16 1325  TempSrc: Oral  PainSc:    Pain Goal: Patients Stated Pain Goal: 4 (03/06/16 0841)               Raissa Dam L

## 2016-03-07 LAB — CBC
HCT: 26.7 % — ABNORMAL LOW (ref 36.0–46.0)
Hemoglobin: 8.8 g/dL — ABNORMAL LOW (ref 12.0–15.0)
MCH: 28.4 pg (ref 26.0–34.0)
MCHC: 33 g/dL (ref 30.0–36.0)
MCV: 86.1 fL (ref 78.0–100.0)
Platelets: 215 10*3/uL (ref 150–400)
RBC: 3.1 MIL/uL — ABNORMAL LOW (ref 3.87–5.11)
RDW: 19.3 % — ABNORMAL HIGH (ref 11.5–15.5)
WBC: 9.3 10*3/uL (ref 4.0–10.5)

## 2016-03-07 LAB — GLUCOSE, CAPILLARY
Glucose-Capillary: 77 mg/dL (ref 65–99)
Glucose-Capillary: 135 mg/dL — ABNORMAL HIGH (ref 65–99)
Glucose-Capillary: 221 mg/dL — ABNORMAL HIGH (ref 65–99)
Glucose-Capillary: 119 mg/dL — ABNORMAL HIGH (ref 65–99)
Glucose-Capillary: 116 mg/dL — ABNORMAL HIGH (ref 65–99)
Glucose-Capillary: 193 mg/dL — ABNORMAL HIGH (ref 65–99)

## 2016-03-07 LAB — GLUCOSE, RANDOM: Glucose, Bld: 187 mg/dL — ABNORMAL HIGH (ref 65–99)

## 2016-03-07 LAB — BIRTH TISSUE RECOVERY COLLECTION (PLACENTA DONATION)

## 2016-03-07 MED ORDER — CANAGLIFLOZIN 100 MG PO TABS: 100.0000 mg | ORAL_TABLET | Freq: Every day | ORAL | Status: DC

## 2016-03-07 MED ORDER — INSULIN ASPART 100 UNIT/ML ~~LOC~~ SOLN: 0.0000 [IU] | Freq: Every day | SUBCUTANEOUS | Status: DC

## 2016-03-07 MED ORDER — GLIPIZIDE ER 2.5 MG PO TB24: 2.5000 mg | ORAL_TABLET | Freq: Every day | ORAL | Status: DC

## 2016-03-07 MED ORDER — INSULIN ASPART 100 UNIT/ML ~~LOC~~ SOLN
0.0000 [IU] | Freq: Three times a day (TID) | SUBCUTANEOUS | Status: DC
  Administered 2016-03-08 – 2016-03-09 (×2): 1 [IU] via SUBCUTANEOUS

## 2016-03-07 MED ORDER — METFORMIN HCL 500 MG PO TABS
1000.0000 mg | ORAL_TABLET | Freq: Two times a day (BID) | ORAL | Status: DC
  Administered 2016-03-07 – 2016-03-09 (×4): 1000 mg via ORAL
  Filled 2016-03-07 (×4): qty 2

## 2016-03-07 MED ORDER — GLIPIZIDE ER 10 MG PO TB24
10.0000 mg | ORAL_TABLET | Freq: Every day | ORAL | Status: DC
  Administered 2016-03-08 – 2016-03-09 (×2): 10 mg via ORAL
  Filled 2016-03-07 (×2): qty 1

## 2016-03-07 MED ORDER — DAPAGLIFLOZIN PROPANEDIOL 10 MG PO TABS: 10.0000 mg | ORAL_TABLET | Freq: Every day | ORAL | Status: DC

## 2016-03-07 MED ORDER — GLIPIZIDE ER 10 MG PO TB24: 20.0000 mg | ORAL_TABLET | Freq: Every day | ORAL | Status: DC

## 2016-03-07 MED ORDER — CANAGLIFLOZIN 100 MG PO TABS
100.0000 mg | ORAL_TABLET | Freq: Every day | ORAL | Status: DC
  Administered 2016-03-08 – 2016-03-09 (×2): 100 mg via ORAL
  Filled 2016-03-07 (×2): qty 1

## 2016-03-07 NOTE — Final Progress Note (Signed)
RN informed Christina [name] CNM of Blood sugar of 219 at 2040. Orders were given to give insulin as ordered  prior and recheck blood sugar at 2200 .

## 2016-03-07 NOTE — Clinical Social Work Maternal (Signed)
  CLINICAL SOCIAL WORK MATERNAL/CHILD NOTE  Patient Details  Name: Christina [name] MRN: [id number] Date of Birth: [birth year]  Date:  03/07/2016  Clinical Social Worker Initiating Note:  Rigoberto Noel, LCSW Date/ Time Initiated:  03/07/16/1345     Child's Name:  Ernesto Rutherford Bonura   Legal Guardian:  Mother   Need for Interpreter:  None   Date of Referral:  03/06/16     Reason for Referral:  Behavioral Health Issues, including SI    Referral Source:  Central Nursery   Address:  [address] 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 CSW explained reason for referral. MOB verbalized understanding. MOB reported that she is currently received outpatient psychiatric care with [name] from [facility] outpatient. MOB reported last visit was around Feb but she plans to follow up now since having baby. MOB reported that she had difficulty walking up and down the stairs at the office to visit which is why she had not been following up. CSW informed her that she could enter through the front door and be escorted to outpatient side in the future if that issue arises.  MOB reported that she typically sees him every 3 months  for follow up. MOB stated that he was working on referring her to a counselor after she had her baby. Mom expressed some concerns with her co-pay if she goes to outpatient counseling but stated she will talk to [name] about referral. MOB reported she had supportive family and friends and basic needs met. MOB declined referral to additional community resources.   CSW Plan/Description:  No Further Intervention Required/No Barriers to Discharge    Essie Christine, LCSW 03/07/2016, 2:11 PM

## 2016-03-07 NOTE — Progress Notes (Signed)
  Went in to discuss recommendations per DM coordinator and clarify home meds.  Per review of chart, pt was on Glipizide 10 mg 2 tabs in am, Dapagliflozin (Farxiga) 10 mg once daily.  Pt admits to being noncompliant with her diet.  I had concerns about the large glipizide dosage so DM coordinator was contacted again.  She stated the dosages larger than 10 mg are not usually any more effective.  Ok to give 10 mg.  She also walked me through a sensitive SSI order set.  I was later contacted by pharmacy and they also had concerns about the large dose of Glipizide.  She also tried to find [place] in the hospital formulary but was unsuccessful.  It was decided to give 1 tablet of the Glipizide and allow pt to take her home [place].  Pharmacist stated she would call RN to let her know.    Pt was previously on regular diet but she was counting carbs.  I changed her to a carb modified diet.  CBGs AC, HS.

## 2016-03-07 NOTE — Progress Notes (Addendum)
Subjective: Postop Day 1: Cesarean Delivery No complaints.  Pain controlled.  Lochia normal.  Breast feeding yes.  Pt has a h/o breast reduction.  States it take awhile for her milk to come in.  Has breast pump in car.  Baby is getting formula now.  Parents would like circumcision done prior to day of discharge.  Pt reports she was only on po medications for Type 2 Diabetes prior to pregnancy. Metformin 1000 mg BID Invokana dose unknown Glipizide 2.5 or 5 mg  CBG 77 overnight. Pt was given food for hypoglycemia.  221 this am at ~0500 Pt states her PCP, Dr. Moshe Cipro in [place], was managing diabetes and then was referred to Dr. Carlis Abbott, endocrinologist.  MFM eventually assumed care of all diabetes management.  Objective: Temp:  [97.6 F (36.4 C)-99 F (37.2 C)] 98.3 F (36.8 C) (08/05 0849) Pulse Rate:  [76-95] 81 (08/05 0849) Resp:  [13-20] 18 (08/05 0849) BP: (111-133)/(65-79) 117/79 (08/05 0849) SpO2:  [92 %-99 %] 99 % (08/05 0849)  Physical Exam: Gen: NAD Lochia: Not visualized Uterine Fundus: firm, appropriately tender Incision: Pressure dressing clean and dry. DVT Evaluation: + Edema present, no calf tenderness bilaterally    Recent Labs  03/06/16 1410 03/07/16 0524  HGB 10.8* 8.8*  HCT 33.0* 26.7*    Assessment/Plan: Status post C-section-doing well postoperatively. Type 2 DM-On po medication prior to pregnancy. S/p breast reduction.  Desires to breast feed. Morbid obesity    Discussed pt's history, medications prior to pregnancy,current insulin management and CBGs with diabetes coordinator, Alfonse Flavors.  She felt Insulin probably not needed and could resume oral medications.  Will discontinue current insulin regimen.  Start Metformin 1000 mg this evening with dinner.  Start Invokana and Glipizide at lower doses once daily.  Encouraged ambulation TID in halls.  Hospital grade breast pump to bedside.  Lactation to follow.     Thurnell Lose 03/07/2016,  11:52 AM

## 2016-03-07 NOTE — Lactation Note (Signed)
This note was copied from a baby's chart. Lactation Consultation Note Follow up visit at 34 hours of age.  Mom reports that she is going to pump a little later.  Baby has had all bottle feedings and no latch attempt in >24 hours.  [place] asked mom on her feeding preference and she said due to her reduction baby wasn't latching.  LC encouraged mom to call for assist as needed.  She then said she would call for assist with using NS at next feeding. LC instructed mom on NS use and mom was able to return demonstration of application.  #24 fits right breasts well and #20 fits ok on left breast, but doesn't pull breast tissue into shield very well.  At this assessment #24 was falling off left breast.   LC encouraged mom to pump now and call for assist when baby is ready to latch.  LC encouraged mom to pump every 3 hours 8X/24 hours to establish a good milk supply. [place] questions moms committment to  [place] baby or pumping.   [place] reported to [facility] RN.     Patient Name: Christina [name] [place] [id number] Date: 03/07/2016 Reason for consult: Follow-up assessment;Breast surgery   Maternal Data    Feeding Feeding Type: Bottle Fed - Formula Nipple Type: Slow - flow  LATCH Score/Interventions                      Lactation Tools Discussed/Used Tools: Nipple Shields Nipple shield size: 20;24 Initiated by:: MBU RN   Consult Status Consult Status: Follow-up Date: 03/08/16 Follow-up type: In-patient    Justice Britain 03/07/2016, 9:04 PM

## 2016-03-08 LAB — GLUCOSE, CAPILLARY
Glucose-Capillary: 136 mg/dL — ABNORMAL HIGH (ref 65–99)
Glucose-Capillary: 137 mg/dL — ABNORMAL HIGH (ref 65–99)
Glucose-Capillary: 114 mg/dL — ABNORMAL HIGH (ref 65–99)
Glucose-Capillary: 80 mg/dL (ref 65–99)
Glucose-Capillary: 126 mg/dL — ABNORMAL HIGH (ref 65–99)

## 2016-03-08 LAB — GLUCOSE, RANDOM: Glucose, Bld: 132 mg/dL — ABNORMAL HIGH (ref 65–99)

## 2016-03-08 MED ORDER — OXYCODONE HCL 5 MG PO TABS
5.0000 mg | ORAL_TABLET | ORAL | Status: DC | PRN
  Administered 2016-03-08 – 2016-03-09 (×4): 5 mg via ORAL
  Filled 2016-03-08 (×4): qty 1

## 2016-03-08 MED ORDER — FERROUS SULFATE 325 (65 FE) MG PO TABS
325.0000 mg | ORAL_TABLET | Freq: Two times a day (BID) | ORAL | Status: DC
  Administered 2016-03-09: 325 mg via ORAL
  Filled 2016-03-08: qty 1

## 2016-03-08 NOTE — Progress Notes (Addendum)
Came by to see patient. Honeycomb dislodged during shower--area cleaned, new Honeycomb placed. Incision CDI.  Patient reports she is NOT allergic to Percocet.  Feels Dilaudid not working very well.  Also taking Tylenol and Motrin. Will Rx Oxycodone as alternative.  Patient has appt with her primary MD, Dr. Tula Nakayama [facility]) on 8/22--this is the provider who manages her diabetes. Current diabetic meds:  Glipizide 24 hour tablet 10 mg daily with breakfast  Invokana 100 mg po daily with breakfast  Glucophage 1000 mg BID with meals  Will anticipate d/c tomorrow--will consult regarding regimen of diabetic meds at d/c. She will continue to monitor her CBGs, and f/u with her primary with diabetes issues.  Donnel Saxon, CNM 03/08/16 9:16p

## 2016-03-08 NOTE — Progress Notes (Signed)
Subjective: Postop Day 2: Cesarean Delivery Resting comfortably in bed, no acute complaints.  Pain controlled.  Lochia normal.  Breast feeding yes.  Pt has a h/o breast reduction.  States it take awhile for her milk to come in.  Has breast pump in car.  Baby is getting formula now.  Tolerating diet.  Ambulating and voiding without difficulty.  - flatus, no BM  In review-  Dr. Moshe Cipro in [place], was managing diabetes and then was referred to Dr. Carlis Abbott, endocrinologist.  MFM eventually assumed care of all diabetes management.  Objective: BP 125/72   Pulse 88   Temp 98.4 F (36.9 C)   Resp 18   LMP 06/07/2015   SpO2 99%   Breastfeeding? Unknown   Physical Exam: Gen: NAD CV: RRR Lungs: CTAB Abd: obese, soft, non-tender, no rebound, no guarding Uterine Fundus: firm, non-tender, below umbilicus Incision: Pressure dressing clean and dry. DVT Evaluation: 1+ Edema present, no calf tenderness bilaterally   Results for orders placed or performed during the hospital encounter of 03/06/16 (from the past 24 hour(s))  Glucose, capillary     Status: Abnormal   Collection Time: 03/07/16 11:42 AM  Result Value Ref Range   Glucose-Capillary 119 (H) 65 - 99 mg/dL  Glucose, capillary     Status: Abnormal   Collection Time: 03/07/16  5:36 PM  Result Value Ref Range   Glucose-Capillary 116 (H) 65 - 99 mg/dL  Glucose, capillary     Status: Abnormal   Collection Time: 03/07/16  9:18 PM  Result Value Ref Range   Glucose-Capillary 193 (H) 65 - 99 mg/dL  Glucose, capillary     Status: Abnormal   Collection Time: 03/08/16  1:46 AM  Result Value Ref Range   Glucose-Capillary 136 (H) 65 - 99 mg/dL  Glucose, random     Status: Abnormal   Collection Time: 03/08/16  5:35 AM  Result Value Ref Range   Glucose, Bld 132 (H) 65 - 99 mg/dL  Glucose, capillary     Status: Abnormal   Collection Time: 03/08/16  7:34 AM  Result Value Ref Range   Glucose-Capillary 137 (H) 65 - 99 mg/dL     Assessment/Plan: 40yo [id number] s/p primary C-section and tubal ligation, POD#2 1) Type 2 DM - on Metformin, Glipizide and SSI, accuchecks as above -s/p diabetic consult, would consider repeat consult or recommendations for discharge  2) chronic HTN -BP stable without medication  3) Morbid obesity -SCDs while in bed, encourage ambulation   4) S/p breast reduction.  Desires to breast feed, lactation following  5) h/o bipolar disorder -seen by social work, pt previously seen at [facility] -no medication currently, mood appropriate, plan for close outpatient follow up  DISPO: Continue routine postoperative care    Janyth Pupa, M 03/08/2016, 8:44 AM

## 2016-03-08 NOTE — Discharge Summary (Signed)
[facility] Discharge Summary   Patient Name:   Christina [name] DOB:     Mar 07, 1976 MRN:     [id number]  Date of Admission:   03/06/2016 Date of Discharge:  03/09/2016  Admitting diagnosis:    CTX Gestational Diabetes and Macrosomia Principal Problem:   Cesarean delivery delivered Active Problems:   Acute blood loss anemia  Patient Active Problem List   Diagnosis Date Noted  . Acute blood loss anemia 03/09/2016  . Cesarean delivery delivered 03/06/2016  . Bipolar 1 disorder ([place]) 11/19/2014  . Fatty liver 10/21/2013  . Seasonal allergies 09/26/2013  . Essential hypertension, benign 08/09/2013  . Iron deficiency anemia 05/15/2013  . Family history of blood disorder 02/28/2013  . 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 Genital herpes 02/22/2013  . GASTROPARESIS 09/02/2009  . GERD 01/27/2009  . Morbid obesity ([place]) 01/05/2008  Desired sterilization    Discharge diagnosis:    CTX Gestational Diabetes and Macrosomia Principal Problem:   Cesarean delivery delivered Active Problems:   Acute blood loss anemia  Patient Active Problem List   Diagnosis Date Noted  . Acute blood loss anemia 03/09/2016  . Cesarean delivery delivered 03/06/2016  . Bipolar 1 disorder ([place]) 11/19/2014  . Fatty liver 10/21/2013  . Seasonal allergies 09/26/2013  . Essential hypertension, benign 08/09/2013  . Iron deficiency anemia 05/15/2013  . Family history of blood disorder 02/28/2013  . 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 Genital herpes 02/22/2013  . GASTROPARESIS 09/02/2009  . GERD 01/27/2009  . Morbid obesity ([place]) 01/05/2008   Tubal sterilization                                                                   Post partum procedures: None  Type of Delivery:  Repeat LTCS and BTL  Delivering Provider: Ena Dawley   Date of Delivery:  03/06/16  Newborn Data:    Live born female  Birth Weight:  10 lb 8.8 oz (4785 g) APGAR: 9, 9  Baby's Name:   Virl Cagey Feeding:   Bottle and Breast Disposition:   home with mother  Complications:   None  Hospital course:      Sceduled C/S   40 y.o. yo G3P2103 at 49w0dwas admitted to the hospital 03/06/2016 for scheduled cesarean section with the following indication:Elective Repeat.  Membrane Rupture Time/Date: 10:34 AM ,03/06/2016   Patient delivered a Viable infant.03/06/2016  Details of operation can be found in separate operative note.  Patient had an uncomplicated postpartum course.  She is ambulating, tolerating a regular diet, passing flatus, and urinating well. Patient is discharged home in stable condition on  03/09/16.    During her pp stay, she was placed on insulin at approx 1/2 pregnancy doses.  Consultation was made with the diabetic educators, and a recommendation was made for the patient to restart po meds (Glyburide, Metformin, and Invokana).  Her CBGs improved to the most recent results below:  CBG (last 3)   Recent Labs  03/08/16 1812 03/08/16 2150 03/09/16 0557  GLUCAP 80 126* 123*   She is to continue her current regimen of Metformin 1000 mg BID, Glipizide 10 mg in am, and Invokana 100 mg in  am.  She is to f/u with her primary MD, Dr. Moshe Cipro, in [place], for diabetes management.  She has an appt with that MD on 03/24/16.           Physical Exam:   Vitals:   03/07/16 1800 03/08/16 0639 03/08/16 1209 03/08/16 1735  BP: 125/81 125/72 128/80 134/78  Pulse: 90 88 86 91  Resp: _0 Temp: 98.6 F (37 C) 98.4 F (36.9 C)  98.4 F (36.9 C)  TempSrc: Oral   Oral  SpO2:       General: alert Lochia: appropriate Uterine Fundus: firm Incision: Dressing is clean, dry, and intact DVT Evaluation: No evidence of DVT seen on physical exam. Negative Homan's sign.  Labs:  CBC Latest Ref Rng & Units 03/07/2016 03/06/2016 03/05/2016  WBC 4.0 - 10.5 K/uL 9.3 11.1(H) 6.9  Hemoglobin 12.0 - 15.0 g/dL 8.8(L) 10.8(L)  10.8(L)  Hematocrit 36.0 - 46.0 % 26.7(L) 33.0(L) 33.4(L)  Platelets 150 - 400 K/uL 215 229 215     CMP Latest Ref Rng & Units 03/08/2016 03/07/2016 03/06/2016  Glucose 65 - 99 mg/dL 132(H) 187(H) 120(H)  BUN 6 - 20 mg/dL - - -  Creatinine 0.44 - 1.00 mg/dL - - 0.70  Sodium 135 - 145 mmol/L - - -  Potassium 3.5 - 5.1 mmol/L - - -  Chloride 101 - 111 mmol/L - - -  CO2 22 - 32 mmol/L - - -  Calcium 8.9 - 10.3 mg/dL - - -  Total Protein 6.1 - 8.1 g/dL - - -  Total Bilirubin 0.2 - 1.2 mg/dL - - -  Alkaline Phos 33 - 115 U/L - - -  AST 10 - 30 U/L - - -  ALT 6 - 29 U/L - - -    Discharge instruction: per After Visit Summary and "Baby and Me Booklet".  After Visit Meds:    Medication List    STOP taking these medications   aspirin 81 MG tablet   HUMULIN N KWIKPEN 100 UNIT/ML Kiwkpen Generic drug:  Insulin NPH (Human) ([place])   insulin lispro 100 UNIT/ML injection Commonly known as:  HUMALOG   ondansetron 4 MG tablet Commonly known as:  ZOFRAN   valACYclovir 500 MG tablet Commonly known as:  VALTREX     TAKE these medications   ACCU-CHEK AVIVA PLUS w/Device Kit 1 kit to test blood sugars daily dx 250.00   acetaminophen 500 MG tablet Commonly known as:  TYLENOL Take 500 mg by mouth every 6 (six) hours as needed for mild pain.   canagliflozin 100 MG Tabs tablet Commonly known as:  INVOKANA Take 1 tablet (100 mg total) by mouth daily before breakfast.   docusate sodium 100 MG capsule Commonly known as:  COLACE Take 100 mg by mouth daily.   ferrous sulfate 325 (65 FE) MG tablet Take 325 mg by mouth 2 (two) times daily with a meal.   glipiZIDE 10 MG 24 hr tablet Commonly known as:  GLUCOTROL XL Take 1 tablet (10 mg total) by mouth daily with breakfast.   ibuprofen 600 MG tablet Commonly known as:  ADVIL,MOTRIN Take 1 tablet (600 mg total) by mouth every 6 (six) hours as needed.   loratadine 10 MG tablet Commonly known as:  CLARITIN Take 10 mg by mouth daily.  Taking generic otc allergy   metFORMIN 1000 MG tablet Commonly known as:  GLUCOPHAGE TAKE ONE TABLET BY MOUTH TWICE DAILY WITH A MEAL. What changed:  additional  instructions   multivitamin-prenatal 27-0.8 MG Tabs tablet Take 1 tablet by mouth daily at 12 noon.   omeprazole 20 MG capsule Commonly known as:  PRILOSEC Take 1 capsule (20 mg total) by mouth daily.   oxyCODONE 5 MG immediate release tablet Commonly known as:  Oxy IR/ROXICODONE Take 1 tablet (5 mg total) by mouth every 3 (three) hours as needed for moderate pain.   terbinafine 1 % cream Commonly known as:  LAMISIL Apply 1 application topically 2 (two) times daily.       Diet: carb modified diet  Activity: Advance as tolerated. Pelvic rest for 6 weeks.   Outpatient follow up:6 weeks with [facility], 8/22 with Dr. Tula Nakayama, primary MD, for diabetes management Follow up Appt: Future Appointments Date Time Provider [place]  03/24/2016 1:15 PM [name], MD [facility]   Follow up visit: BP check in office later this week.  Postpartum contraception: Tubal Ligation  03/09/2016 Donnel Saxon, CNM

## 2016-03-09 ENCOUNTER — Encounter (HOSPITAL_COMMUNITY): Payer: Self-pay | Admitting: Obstetrics and Gynecology

## 2016-03-09 DIAGNOSIS — D62 Acute posthemorrhagic anemia: Secondary | ICD-10-CM | POA: Diagnosis not present

## 2016-03-09 LAB — TYPE AND SCREEN
ABO/RH(D): A POS
Antibody Screen: NEGATIVE
Unit division: 0
Unit division: 0
Unit division: 0

## 2016-03-09 LAB — GLUCOSE, CAPILLARY
Glucose-Capillary: 123 mg/dL — ABNORMAL HIGH (ref 65–99)
Glucose-Capillary: 123 mg/dL — ABNORMAL HIGH (ref 65–99)

## 2016-03-09 MED ORDER — CANAGLIFLOZIN 100 MG PO TABS: 100.0000 mg | ORAL_TABLET | Freq: Every day | ORAL | 1 refills | Status: DC

## 2016-03-09 MED ORDER — GLIPIZIDE ER 10 MG PO TB24: 10.0000 mg | ORAL_TABLET | Freq: Every day | ORAL | 1 refills | Status: DC

## 2016-03-09 MED ORDER — OXYCODONE HCL 5 MG PO TABS: 5.0000 mg | ORAL_TABLET | ORAL | 0 refills | Status: DC | PRN

## 2016-03-09 MED ORDER — IBUPROFEN 600 MG PO TABS: 600.0000 mg | ORAL_TABLET | Freq: Four times a day (QID) | ORAL | 2 refills | Status: DC | PRN

## 2016-03-09 MED ORDER — METFORMIN HCL 1000 MG PO TABS: ORAL_TABLET | ORAL | 1 refills | Status: DC

## 2016-03-09 NOTE — Discharge Instructions (Signed)
Take diabetic medications as ordered.  If blood sugar is >200 or <80, contact your primary MD, Dr. Lodema HongSimpson, to discuss. [facility] will call you and schedule a visit later this week for a BP check. If you have any increased swelling, blurry vision, severe headaches, or any other issues, call [facility].  Postpartum Care After Cesarean Delivery After you deliver your newborn (postpartum period), the usual stay in the hospital is 24-72 hours. If there were problems with your labor or delivery, or if you have other medical problems, you might be in the hospital longer.  While you are in the hospital, you will receive help and instructions on how to care for yourself and your newborn during the postpartum period.  While you are in the hospital:  It is normal for you to have pain or discomfort from the incision in your abdomen. Be sure to tell your nurses when you are having pain, where the pain is located, and what makes the pain worse.  If you are breastfeeding, you may feel uncomfortable contractions of your uterus for a couple of weeks. This is normal. The contractions help your uterus get back to normal size.  It is normal to have some bleeding after delivery.  For the first 1-3 days after delivery, the flow is red and the amount may be similar to a period.  It is common for the flow to start and stop.  In the first few days, you may pass some small clots. Let your nurses know if you begin to pass large clots or your flow increases.  Do not  flush blood clots down the toilet before having the nurse look at them.  During the next 3-10 days after delivery, your flow should become more watery and pink or brown-tinged in color.  Ten to fourteen days after delivery, your flow should be a small amount of yellowish-white discharge.  The amount of your flow will decrease over the first few weeks after delivery. Your flow may stop in 6-8 weeks. Most women have had their flow stop by 12 weeks after  delivery.  You should change your sanitary pads frequently.  Wash your hands thoroughly with soap and water for at least 20 seconds after changing pads, using the toilet, or before holding or feeding your newborn.  Your intravenous (IV) tubing will be removed when you are drinking enough fluids.  The urine drainage tube (urinary catheter) that was inserted before delivery may be removed within 6-8 hours after delivery or when feeling returns to your legs. You should feel like you need to empty your bladder within the first 6-8 hours after the catheter has been removed.  In case you become weak, lightheaded, or faint, call your nurse before you get out of bed for the first time and before you take a shower for the first time.  Within the first few days after delivery, your breasts may begin to feel tender and full. This is called engorgement. Breast tenderness usually goes away within 48-72 hours after engorgement occurs. You may also notice milk leaking from your breasts. If you are not breastfeeding, do not stimulate your breasts. Breast stimulation can make your breasts produce more milk.  Spending as much time as possible with your newborn is very important. During this time, you and your newborn can feel close and get to know each other. Having your newborn stay in your room (rooming in) will help to strengthen the bond with your newborn. It will give you time to  get to know your newborn and become comfortable caring for your newborn.  Your hormones change after delivery. Sometimes the hormone changes can temporarily cause you to feel sad or tearful. These feelings should not last more than a few days. If these feelings last longer than that, you should talk to your caregiver.  If desired, talk to your caregiver about methods of family planning or contraception.  Talk to your caregiver about immunizations. Your caregiver may want you to have the following immunizations before leaving the  hospital:  Tetanus, diphtheria, and pertussis (Tdap) or tetanus and diphtheria (Td) immunization. It is very important that you and your family (including grandparents) or others caring for your newborn are up-to-date with the Tdap or Td immunizations. The Tdap or Td immunization can help protect your newborn from getting ill.  Rubella immunization.  Varicella (chickenpox) immunization.  Influenza immunization. You should receive this annual immunization if you did not receive the immunization during your pregnancy.   This information is not intended to replace advice given to you by your health care provider. Make sure you discuss any questions you have with your health care provider.   Document Released: 04/13/2012 Document Reviewed: 04/13/2012 Elsevier Interactive Patient Education 2016 [place] IF:   You are unable to eat food or drink fluids for more than 6 hours.  You have nausea and vomiting for more than 6 hours.  You have a blood glucose level of 200 mg/dL and you have ketones in your urine.  There is a change in mental status.  You develop vision problems.  You have a persistent headache.  You have upper abdominal pain or discomfort.  You have an additional serious sickness.  You have diarrhea for more than 6 hours.  You have been sick or have had a fever for 2 days and are not getting better. SEEK IMMEDIATE MEDICAL CARE IF:  You have difficulty breathing.  You no longer feel your baby moving.  You are bleeding or have discharge from your vagina.  You start having premature contractions or labor. MAKE SURE YOU:  Understand these instructions.  Will watch your condition.  Will get help right away if you are not doing well or get worse.   This information is not intended to replace advice given to you by your health care provider. Make sure you discuss any questions you have with your health care provider.   Document Released:  04/13/2012 Document Revised: 08/10/2014 Document Reviewed: 04/13/2012 Elsevier Interactive Patient Education [employer].

## 2016-03-16 ENCOUNTER — Other Ambulatory Visit: Payer: Self-pay

## 2016-03-16 ENCOUNTER — Encounter: Payer: Self-pay | Admitting: Family Medicine

## 2016-03-16 ENCOUNTER — Ambulatory Visit (INDEPENDENT_AMBULATORY_CARE_PROVIDER_SITE_OTHER): Payer: 59 | Admitting: Family Medicine

## 2016-03-16 ENCOUNTER — Other Ambulatory Visit (HOSPITAL_COMMUNITY)
Admission: AD | Admit: 2016-03-16 | Discharge: 2016-03-16 | Disposition: A | Discharge status: Home / Self Care | Payer: 59 | Source: Skilled Nursing Facility | Attending: Family Medicine | Admitting: Family Medicine

## 2016-03-16 VITALS — BP 140/100 | HR 92 | Resp 16 | Ht 64.0 in | Wt 247.0 lb

## 2016-03-16 DIAGNOSIS — E669 Obesity, unspecified: Secondary | ICD-10-CM

## 2016-03-16 DIAGNOSIS — K219 Gastro-esophageal reflux disease without esophagitis: Secondary | ICD-10-CM

## 2016-03-16 DIAGNOSIS — D62 Acute posthemorrhagic anemia: Secondary | ICD-10-CM

## 2016-03-16 DIAGNOSIS — E1169 Type 2 diabetes mellitus with other specified complication: Secondary | ICD-10-CM

## 2016-03-16 DIAGNOSIS — I10 Essential (primary) hypertension: Secondary | ICD-10-CM

## 2016-03-16 DIAGNOSIS — E119 Type 2 diabetes mellitus without complications: Secondary | ICD-10-CM | POA: Insufficient documentation

## 2016-03-16 DIAGNOSIS — E1159 Type 2 diabetes mellitus with other circulatory complications: Secondary | ICD-10-CM | POA: Insufficient documentation

## 2016-03-16 DIAGNOSIS — F319 Bipolar disorder, unspecified: Secondary | ICD-10-CM

## 2016-03-16 LAB — LIPID PANEL
Cholesterol: 155 mg/dL (ref 125–200)
HDL: 89 mg/dL (ref >=46)
LDL Cholesterol: 40 mg/dL (ref <130)
Total CHOL/HDL Ratio: 1.7 Ratio (ref <=5.0)
Triglycerides: 131 mg/dL (ref <150)
VLDL: 26 mg/dL (ref <30)

## 2016-03-16 LAB — BASIC METABOLIC PANEL WITH GFR
BUN: 10 mg/dL (ref 7–25)
CO2: 25 mmol/L (ref 20–31)
Calcium: 9.1 mg/dL (ref 8.6–10.2)
Chloride: 103 mmol/L (ref 98–110)
Creat: 0.93 mg/dL (ref 0.50–1.10)
GFR, Est African American: 89 mL/min (ref >=60)
GFR, Est Non African American: 77 mL/min (ref >=60)
Glucose, Bld: 121 mg/dL — ABNORMAL HIGH (ref 65–99)
Potassium: 3.9 mmol/L (ref 3.5–5.3)
Sodium: 138 mmol/L (ref 135–146)

## 2016-03-16 LAB — TSH: TSH: 0.79 mIU/L

## 2016-03-16 MED ORDER — LABETALOL HCL 100 MG PO TABS: 100.0000 mg | ORAL_TABLET | Freq: Two times a day (BID) | ORAL | 3 refills | Status: DC

## 2016-03-16 MED ORDER — RANITIDINE HCL 150 MG PO TABS: 150.0000 mg | ORAL_TABLET | Freq: Two times a day (BID) | ORAL | 1 refills | Status: DC

## 2016-03-16 MED ORDER — ONETOUCH DELICA LANCETS 33G MISC: 5 refills | Status: DC

## 2016-03-16 NOTE — Assessment & Plan Note (Signed)
Updated lab needed at/ before next visit. Christina [name] is reminded of the importance of commitment to daily physical activity for 30 minutes or more, as able and the need to limit carbohydrate intake to 30 to 60 grams per meal to help with blood sugar control.   The need to take medication as prescribed, test blood sugar as directed, and to call between visits if there is a concern that blood sugar is uncontrolled is also discussed.   Christina [name] is reminded of the importance of daily foot exam, annual eye examination, and good blood sugar, blood pressure and cholesterol control.  Diabetic Labs Latest Ref Rng & Units 03/16/2016 03/06/2016 03/05/2016 05/24/2015 02/02/2015  HbA1c <5.7 % - - - 7.8(H) 7.6(H)  Microalbumin <2.0 mg/dL - - - - -  Micro/Creat Ratio 0.0 - 30.0 mg/g - - - - -  Chol 125 - 200 mg/dL 155 - - - 134  HDL >=46 mg/dL 89 - - - 55  Calc LDL <130 mg/dL 40 - - - 60  Triglycerides <150 mg/dL 131 - - - 96  Creatinine 0.50 - 1.10 mg/dL 0.93 0.70 0.83 0.83 0.82   BP/Weight 03/16/2016 03/09/2016 03/04/2016 02/07/2016 11/01/2015 123XX123 [id number]  Systolic BP [id number] AB-[id number] - 123456 AB-[id number] XX123456 -  Diastolic BP 123XX123 73 - 80 74 79 -  Wt. (Lbs) 247 - 270 261.8 241.38 239.8 235.1  BMI 42.4 - 46.35 43.57 40.81 40.54 39.75  Some encounter information is confidential and restricted. Go to Review Flowsheets activity to see all data.   Foot/eye exam completion dates Latest Ref Rng & Units 03/16/2016 12/05/2015  Eye Exam No Retinopathy - No Retinopathy  Foot Form Completion - Done -

## 2016-03-16 NOTE — Progress Notes (Signed)
Christina [name]     MRN: [id number]      DOB: 1975-11-19   HPI Christina [name] is here for follow up and re-evaluation of chronic medical conditions, medication management and review of any available recent lab and radiology data.  Preventive health is updated, specifically  Cancer screening and Immunization.   Questions or concerns regarding consultations or procedures which the PT has had in the interim are  addressed. Delivered baby boy on 08/04, c section, 10 pound 8 oz 2 week h/o left chest pain , bad when she climbs steps, substernal, constant , no specific  Aggravating or relieving factor, not associated with nausea, light headedness or diaphoreseis. A lot of increased stress at home as spouse recently diagnosed with growth on liver which may be cancer, will hear this pm more info regarding the situation States BP high when checked, partially breast feeding Reports good blood sugars generally under 120 on current regime Had 3 watery stool yesterday and one today, no other family member is affected   ROS Denies recent fever or chills. Denies sinus pressure, nasal congestion, ear pain or sore throat. Denies chest congestion, productive cough or wheezing. Denies , palpitations and leg swelling Denies abdominal pain, nausea, vomiting, or constipation.   Denies dysuria, frequency, hesitancy or incontinence. Denies joint pain, swelling and limitation in mobility. Denies headaches, seizures, numbness, or tingling.  Denies skin break down or rash.   PE  BP (!) 150/92   Pulse 92   Resp 16   Ht 5\' 4"  (1.626 m)   Wt 247 lb (112 kg)   LMP 06/07/2015   SpO2 97%   BMI 42.40 kg/m   Patient alert and oriented and in no cardiopulmonary distress.  HEENT: No facial asymmetry, EOMI,   oropharynx pink and moist.  Neck supple no JVD, no mass.  Chest: Clear to auscultation bilaterally.  CVS: S1, S2 no murmurs, no S3.Regular rate.  ABD: Soft non tender.   Ext: No edema  MS: Adequate ROM  spine, shoulders, hips and knees.  Skin: Intact, no ulcerations or rash noted.  Psych: Good eye contact, normal affect. Memory intact mildly  anxious and  depressed appearing.  CNS: CN 2-12 intact, power,  normal throughout.no focal deficits noted.   Assessment & Plan  Essential hypertension, benign Uncontrolled start labetalol.Nurse bP check in 3 weeks DASH diet and commitment to daily physical activity for a minimum of 30 minutes discussed and encouraged, as a part of hypertension management. The importance of attaining a healthy weight is also discussed.  BP/Weight 03/16/2016 03/09/2016 03/04/2016 02/07/2016 11/01/2015 123XX123 [id number]  Systolic BP [id number] [id number] - 123456 AB-123456789 XX123456 -  Diastolic BP 123XX123 73 - 80 74 79 -  Wt. (Lbs) 247 - 270 261.8 241.38 239.8 235.1  BMI 42.4 - 46.35 43.57 40.81 40.54 39.75  Some encounter information is confidential and restricted. Go to Review Flowsheets activity to see all data.       Acute blood loss anemia Continue twice daily iron supplement , rept lab in 3 month  GERD Uncontrolled add zantac to current PPI, caffeine avoidance stressed  Morbid obesity Unchanged Patient re-educated about  the importance of commitment to a  minimum of 150 minutes of exercise per week.  The importance of healthy food choices with portion control discussed. Encouraged to start a food diary, count calories and to consider  joining a support group. Sample diet sheets offered. Goals set by the patient for the next several months.  Weight /BMI 03/16/2016 03/04/2016 02/07/2016  WEIGHT 247 lb 270 lb 261 lb 12.8 oz  HEIGHT 5\' 4"  5\' 4"  5\' 5"   BMI 42.4 kg/m2 46.35 kg/m2 43.57 kg/m2  Some encounter information is confidential and restricted. Go to Review Flowsheets activity to see all data.      Bipolar 1 disorder Managed by psych and stable currently  Diabetes mellitus type 2 in obese [facility]) Updated lab needed at/ before next visit. Christina [name] is reminded of the importance of  commitment to daily physical activity for 30 minutes or more, as able and the need to limit carbohydrate intake to 30 to 60 grams per meal to help with blood sugar control.   The need to take medication as prescribed, test blood sugar as directed, and to call between visits if there is a concern that blood sugar is uncontrolled is also discussed.   Christina [name] is reminded of the importance of daily foot exam, annual eye examination, and good blood sugar, blood pressure and cholesterol control.  Diabetic Labs Latest Ref Rng & Units 03/16/2016 03/06/2016 03/05/2016 05/24/2015 02/02/2015  HbA1c <5.7 % - - - 7.8(H) 7.6(H)  Microalbumin <2.0 mg/dL - - - - -  Micro/Creat Ratio 0.0 - 30.0 mg/g - - - - -  Chol 125 - 200 mg/dL 155 - - - 134  HDL >=46 mg/dL 89 - - - 55  Calc LDL <130 mg/dL 40 - - - 60  Triglycerides <150 mg/dL 131 - - - 96  Creatinine 0.50 - 1.10 mg/dL 0.93 0.70 0.83 0.83 0.82   BP/Weight 03/16/2016 03/09/2016 03/04/2016 02/07/2016 11/01/2015 123XX123 [id number]  Systolic BP [id number] [id number] - 123456 AB-123456789 XX123456 -  Diastolic BP 123XX123 73 - 80 74 79 -  Wt. (Lbs) 247 - 270 261.8 241.38 239.8 235.1  BMI 42.4 - 46.35 43.57 40.81 40.54 39.75  Some encounter information is confidential and restricted. Go to Review Flowsheets activity to see all data.   Foot/eye exam completion dates Latest Ref Rng & Units 03/16/2016 12/05/2015  Eye Exam No Retinopathy - No Retinopathy  Foot Form Completion - Done -

## 2016-03-16 NOTE — Assessment & Plan Note (Signed)
Uncontrolled add zantac to current PPI, caffeine avoidance stressed

## 2016-03-16 NOTE — Assessment & Plan Note (Signed)
Unchanged Patient re-educated about  the importance of commitment to a  minimum of 150 minutes of exercise per week.  The importance of healthy food choices with portion control discussed. Encouraged to start a food diary, count calories and to consider  joining a support group. Sample diet sheets offered. Goals set by the patient for the next several months.   Weight /BMI 03/16/2016 03/04/2016 02/07/2016  WEIGHT 247 lb 270 lb 261 lb 12.8 oz  HEIGHT 5\' 4"  5\' 4"  5\' 5"   BMI 42.4 kg/m2 46.35 kg/m2 43.57 kg/m2  Some encounter information is confidential and restricted. Go to Review Flowsheets activity to see all data.

## 2016-03-16 NOTE — Assessment & Plan Note (Signed)
Managed by psych and stable currently

## 2016-03-16 NOTE — Progress Notes (Signed)
delica l

## 2016-03-16 NOTE — Assessment & Plan Note (Addendum)
Uncontrolled start labetalol.Nurse bP check in 3 weeks DASH diet and commitment to daily physical activity for a minimum of 30 minutes discussed and encouraged, as a part of hypertension management. The importance of attaining a healthy weight is also discussed.  BP/Weight 03/16/2016 03/09/2016 03/04/2016 02/07/2016 11/01/2015 123XX123 [id number]  Systolic BP XX123456 AB-123456789 - 123456 AB-123456789 XX123456 -  Diastolic BP 123XX123 73 - 80 74 79 -  Wt. (Lbs) 247 - 270 261.8 241.38 239.8 235.1  BMI 42.4 - 46.35 43.57 40.81 40.54 39.75  Some encounter information is confidential and restricted. Go to Review Flowsheets activity to see all data.

## 2016-03-16 NOTE — Assessment & Plan Note (Signed)
Continue twice daily iron supplement , rept lab in 3 month

## 2016-03-16 NOTE — Patient Instructions (Addendum)
Nurse BP check in 3 weeks  MD f/u in 3 month, call if you e need me before  Add zantac twice  Daily for heartburn  New additional BP med safe with breast feeding to be sent , labetalol twice  Daily 12 hrs apart, eg 8am and 8pm  Foot exam shows only callus  Microalb today   Prayers for the family  Goal for fasting blood sugar ranges from 80 to 120 and 2 hours after any meal or at bedtime should be between 130 to 170.   Thank you  for choosing [facility]. We consider it a privelige to serve you.  Delivering excellent health care in a caring and  compassionate way is our goal.  Partnering with you,  so that together we can achieve this goal is our strategy.    hBA1c and chem 7 and eGFR in 3 month

## 2016-03-17 LAB — HEMOGLOBIN A1C
Hgb A1c MFr Bld: 6.4 % — ABNORMAL HIGH (ref <5.7)
Mean Plasma Glucose: 137 mg/dL

## 2016-03-17 LAB — MICROALBUMIN / CREATININE URINE RATIO
Creatinine, Urine: 196.8 mg/dL
Microalb Creat Ratio: 15.2 mg/g creat (ref 0.0–30.0)
Microalb, Ur: 29.9 ug/mL — ABNORMAL HIGH

## 2016-03-19 ENCOUNTER — Telehealth: Payer: Self-pay | Admitting: Family Medicine

## 2016-03-19 DIAGNOSIS — R197 Diarrhea, unspecified: Secondary | ICD-10-CM

## 2016-03-19 NOTE — Telephone Encounter (Signed)
Spoke with pt ongoing loose watery stool since past 5 dates, 4 today r Pls order stool c/s and c diff and O/P stop metformin  Lomotil to be sent to rx if oK with breast feeding , will let pt know if not

## 2016-03-19 NOTE — Telephone Encounter (Signed)
Per pharmacist, lomotil IS excreted in breastmilk. Will not send in

## 2016-03-24 ENCOUNTER — Ambulatory Visit: Payer: Self-pay | Admitting: Family Medicine

## 2016-04-07 ENCOUNTER — Encounter: Payer: Self-pay | Admitting: Family Medicine

## 2016-04-07 ENCOUNTER — Telehealth: Payer: Self-pay

## 2016-04-07 ENCOUNTER — Other Ambulatory Visit: Payer: Self-pay | Admitting: Family Medicine

## 2016-04-07 NOTE — Telephone Encounter (Signed)
Message left for patient

## 2016-04-07 NOTE — Telephone Encounter (Signed)
Medication recommended has lactation warning, so unable to prescribed med, I recommend she contact GI for help, use natural stool softeners, like collardf greens, prune juice , inc fiber and water intake so no straining

## 2016-04-07 NOTE — Telephone Encounter (Signed)
X 1 week she has been having problems with hemorrhoids and wants something called in for them.  (Came in for BP check today but had forgotten to take her medication so BP was high) Will come back Thursday am

## 2016-04-09 ENCOUNTER — Other Ambulatory Visit: Payer: Self-pay | Admitting: Family Medicine

## 2016-04-09 ENCOUNTER — Ambulatory Visit (INDEPENDENT_AMBULATORY_CARE_PROVIDER_SITE_OTHER): Payer: 59

## 2016-04-09 VITALS — BP 148/94

## 2016-04-09 DIAGNOSIS — Z23 Encounter for immunization: Secondary | ICD-10-CM

## 2016-04-09 DIAGNOSIS — I10 Essential (primary) hypertension: Secondary | ICD-10-CM

## 2016-04-09 MED ORDER — LABETALOL HCL 100 MG PO TABS: ORAL_TABLET | ORAL | 3 refills | Status: DC

## 2016-05-01 ENCOUNTER — Telehealth: Payer: Self-pay

## 2016-05-01 NOTE — Telephone Encounter (Signed)
Advised patient to come in for nurse blood pressure check

## 2016-05-22 ENCOUNTER — Ambulatory Visit: Payer: 59

## 2016-05-22 VITALS — BP 138/88

## 2016-05-22 DIAGNOSIS — I10 Essential (primary) hypertension: Secondary | ICD-10-CM

## 2016-05-23 NOTE — Progress Notes (Signed)
Late entry for 10/20:Patient in for nurse visit.  States that she felt like her blood pressure was elevated.  She has had headache and dizziness x 2 days.  Blood pressure checked manually.  Within normal limits.  Patient will continue medication as prescribed and keep next scheduled appointment.   Will call office for sooner appointment if problem continues.

## 2016-05-26 ENCOUNTER — Ambulatory Visit: Payer: 59 | Admitting: Podiatry

## 2016-05-29 ENCOUNTER — Encounter: Payer: Self-pay | Admitting: Family Medicine

## 2016-05-29 ENCOUNTER — Other Ambulatory Visit: Payer: Self-pay | Admitting: Family Medicine

## 2016-05-29 DIAGNOSIS — E118 Type 2 diabetes mellitus with unspecified complications: Secondary | ICD-10-CM

## 2016-05-29 MED ORDER — GLIPIZIDE ER 10 MG PO TB24: 10.0000 mg | ORAL_TABLET | Freq: Every day | ORAL | 1 refills | Status: DC

## 2016-05-29 MED ORDER — CANAGLIFLOZIN 100 MG PO TABS: 100.0000 mg | ORAL_TABLET | Freq: Every day | ORAL | 1 refills | Status: DC

## 2016-06-01 ENCOUNTER — Ambulatory Visit (INDEPENDENT_AMBULATORY_CARE_PROVIDER_SITE_OTHER): Payer: 59 | Admitting: Family Medicine

## 2016-06-01 ENCOUNTER — Encounter: Payer: Self-pay | Admitting: Family Medicine

## 2016-06-01 VITALS — BP 140/90 | HR 88 | Temp 98.8°F | Resp 18 | Ht 64.5 in | Wt 239.0 lb

## 2016-06-01 DIAGNOSIS — J0101 Acute recurrent maxillary sinusitis: Secondary | ICD-10-CM

## 2016-06-01 MED ORDER — AZITHROMYCIN 250 MG PO TABS: ORAL_TABLET | ORAL | 0 refills | Status: DC

## 2016-06-01 MED ORDER — FLUCONAZOLE 150 MG PO TABS: 150.0000 mg | ORAL_TABLET | Freq: Once | ORAL | 0 refills | Status: AC

## 2016-06-01 NOTE — Progress Notes (Signed)
Chief Complaint  Patient presents with  . Ear Pain    bilateral x 5 days   Patient is here for an upper respiratory infection. She states the symptoms (per 5 days. She has sinus pressure and pain. Nasal congestion. Thick yellow and white postnasal drip. Ear pressure and pain. Vertigo. And sore throat. She thinks she has a low-grade fever. No malaise. No body aches. No sweats or chills. 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 She also has 3 small children. She states she feels she has an infection that needs an antibiotic. She states "I can tell" when the infection isn't going to go away. We had a discussion about treating viruses with antibiotics and the importance of trying to avoid antibiotics for upper respiratory infections. I did give her a prescription for azithromycin, but encouraged her not to take it unless her symptoms worsen over the next couple of days. I also gave her prescription for Diflucan to use if needed   Patient Active Problem List   Diagnosis Date Noted  . Diabetes mellitus type 2 in obese ([place]) 03/16/2016  . Acute blood loss anemia 03/09/2016  . Cesarean delivery delivered 03/06/2016  . Bipolar 1 disorder ([place]) 11/19/2014  . Fatty liver 10/21/2013  . Seasonal allergies 09/26/2013  . Essential hypertension, benign 08/09/2013  . Iron deficiency anemia 05/15/2013  . Family history of blood disorder 02/28/2013  . 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 GASTROPARESIS 09/02/2009  . GERD 01/27/2009  . Morbid obesity ([place]) 01/05/2008    Outpatient Encounter Prescriptions as of 06/01/2016  Medication Sig  . acetaminophen (TYLENOL) 500 MG tablet Take 500 mg by mouth every 6 (six) hours as needed for mild pain.  . canagliflozin (INVOKANA) 100 MG TABS tablet Take 1 tablet (100 mg total) by mouth daily before breakfast.  . docusate sodium (COLACE) 100 MG capsule Take 100 mg by mouth daily.  . ferrous  sulfate 325 (65 FE) MG tablet Take 325 mg by mouth 2 (two) times daily with a meal.  . glipiZIDE (GLUCOTROL XL) 10 MG 24 hr tablet Take 1 tablet (10 mg total) by mouth daily with breakfast.  . ibuprofen (ADVIL,MOTRIN) 600 MG tablet Take 1 tablet (600 mg total) by mouth every 6 (six) hours as needed.  . labetalol (NORMODYNE) 100 MG tablet One and a half tablets twice daily  . loratadine (CLARITIN) 10 MG tablet Take 10 mg by mouth daily. Taking generic otc allergy  . metFORMIN (GLUCOPHAGE) 1000 MG tablet TAKE ONE TABLET BY MOUTH TWICE DAILY WITH A MEAL.  Marland Kitchen omeprazole (PRILOSEC) 20 MG capsule Take 1 capsule (20 mg total) by mouth daily.  Glory Rosebush DELICA LANCETS [id number] MISC Once daily testing dx e11.9  . Prenatal Vit-Fe Fumarate-FA (MULTIVITAMIN-PRENATAL) 27-0.8 MG TABS tablet Take 1 tablet by mouth daily at 12 noon.  . ranitidine (ZANTAC) 150 MG tablet Take 1 tablet (150 mg total) by mouth 2 (two) times daily.  . [DISCONTINUED] oxyCODONE (OXY IR/ROXICODONE) 5 MG immediate release tablet Take 1 tablet (5 mg total) by mouth every 3 (three) hours as needed for moderate pain.  Marland Kitchen azithromycin (ZITHROMAX) 250 MG tablet Tad  . fluconazole (DIFLUCAN) 150 MG tablet Take 1 tablet (150 mg total) by mouth once.   No facility-administered encounter medications on file as of 06/01/2016.     Allergies  Allergen Reactions  . Other Anaphylaxis    Tree nuts  . Peanut Oil Anaphylaxis  .  Peanut-Containing Drug Products Anaphylaxis  . Ace Inhibitors Cough    Pt on no contaception and is at reproductive age, discussed with nephrology also  . Invokana [Canagliflozin] Other (See Comments)     Worried about ketoacidosis  . Augmentin [Amoxicillin-Pot Clavulanate] Nausea And Vomiting    Causes stomach cramps. Patient can take amoxicillin, not Augmentin.    Review of Systems  Constitutional: Positive for fatigue and fever.  HENT: Positive for ear pain, postnasal drip, rhinorrhea, sinus pressure and sore throat.     Eyes: Negative for photophobia and redness.  Respiratory: Positive for cough. Negative for shortness of breath and wheezing.   Cardiovascular: Negative for chest pain, palpitations and leg swelling.  Gastrointestinal: Negative for diarrhea, nausea and vomiting.  Endocrine: Positive for polydipsia.       Thirst  Genitourinary: Negative for frequency.  Musculoskeletal: Negative for arthralgias and myalgias.  Neurological: Positive for dizziness and headaches.       Mild vertigo  Psychiatric/Behavioral: Negative for dysphoric mood. The patient is not nervous/anxious.   All other systems reviewed and are negative.   BP 140/90   Pulse 88   Temp 98.8 F (37.1 C) (Oral)   Resp 18   Ht 5' 4.5" (1.638 m)   Wt 239 lb 0.6 oz (108.4 kg)   LMP 05/22/2016   SpO2 98%   Breastfeeding? Unknown   BMI 40.40 kg/m   Physical Exam  Constitutional: She is oriented to person, place, and time. She appears well-developed and well-nourished. No distress.  Mild ill appearance  HENT:  Head: Normocephalic and atraumatic.  Right Ear: External ear normal.  Left Ear: External ear normal.  Mouth/Throat: Oropharynx is clear and moist. No oropharyngeal exudate.  Nasal membranes swollen and pink. Clear postnasal drip. Posterior pharynx mildly injected. No exudate. Frontal and maxillary sinuses are tender  Eyes: Conjunctivae are normal. Pupils are equal, round, and reactive to light.  Neck: Normal range of motion. No thyromegaly present.  Mild tenderness. No adenopathy  Cardiovascular: Normal rate, regular rhythm and normal heart sounds.   Pulmonary/Chest: Effort normal and breath sounds normal. No respiratory distress. She has no wheezes.  Lymphadenopathy:    She has no cervical adenopathy.  Neurological: She is alert and oriented to person, place, and time.  Skin: Skin is warm and dry.  Psychiatric: She has a normal mood and affect. Her behavior is normal.    ASSESSMENT/PLAN:  1. Acute recurrent  maxillary sinusitis See discussion above. Over-the-counter medicines recommended. Start antibiotic if symptoms fail to improve.   Patient Instructions  Push fluids Take the antibiotic if you have severe symptoms or infection  You may take OTC for cold symptoms: mucinex or mucinex DM Robitussin or DM Coricidin HBP Saline nasal spray Steroid nasal spray ( flonase or nasacort)   Raylene Everts, MD

## 2016-06-01 NOTE — Patient Instructions (Signed)
Push fluids Take the antibiotic if you have severe symptoms or infection  You may take OTC for cold symptoms: mucinex or mucinex DM Robitussin or DM Coricidin HBP Saline nasal spray Steroid nasal spray ( flonase or nasacort)

## 2016-06-02 ENCOUNTER — Telehealth: Payer: Self-pay | Admitting: Family Medicine

## 2016-06-02 NOTE — Telephone Encounter (Signed)
Please advise.  Seen 10/30

## 2016-06-02 NOTE — Telephone Encounter (Signed)
Kellyanne is calling stating that she will need a work note from Dr. Meda Coffee today, faxed to her at [phone], please advise?

## 2016-06-03 NOTE — Telephone Encounter (Signed)
Left message to call back  

## 2016-06-03 NOTE — Telephone Encounter (Signed)
Call patient to see what days she needs, then we will accommodate.  YSN

## 2016-06-13 LAB — HEMOGLOBIN A1C
Hgb A1c MFr Bld: 7 % — ABNORMAL HIGH (ref <5.7)
Mean Plasma Glucose: 154 mg/dL

## 2016-06-13 LAB — BASIC METABOLIC PANEL WITH GFR
BUN: 8 mg/dL (ref 7–25)
CO2: 25 mmol/L (ref 20–31)
Calcium: 8.9 mg/dL (ref 8.6–10.2)
Chloride: 105 mmol/L (ref 98–110)
Creat: 0.79 mg/dL (ref 0.50–1.10)
GFR, Est African American: >89 mL/min (ref >=60)
GFR, Est Non African American: >89 mL/min (ref >=60)
Glucose, Bld: 121 mg/dL — ABNORMAL HIGH (ref 65–99)
Potassium: 4 mmol/L (ref 3.5–5.3)
Sodium: 138 mmol/L (ref 135–146)

## 2016-06-16 ENCOUNTER — Encounter: Payer: Self-pay | Admitting: Podiatry

## 2016-06-16 ENCOUNTER — Ambulatory Visit (INDEPENDENT_AMBULATORY_CARE_PROVIDER_SITE_OTHER): Payer: 59 | Admitting: Family Medicine

## 2016-06-16 ENCOUNTER — Other Ambulatory Visit: Payer: Self-pay | Admitting: *Deleted

## 2016-06-16 ENCOUNTER — Ambulatory Visit: Payer: 59

## 2016-06-16 ENCOUNTER — Encounter: Payer: Self-pay | Admitting: Family Medicine

## 2016-06-16 ENCOUNTER — Ambulatory Visit (INDEPENDENT_AMBULATORY_CARE_PROVIDER_SITE_OTHER): Payer: 59 | Admitting: Podiatry

## 2016-06-16 VITALS — BP 146/94 | HR 73 | Resp 16 | Ht 65.0 in | Wt 237.0 lb

## 2016-06-16 VITALS — BP 142/88 | HR 85 | Resp 16

## 2016-06-16 DIAGNOSIS — E669 Obesity, unspecified: Secondary | ICD-10-CM | POA: Diagnosis not present

## 2016-06-16 DIAGNOSIS — I10 Essential (primary) hypertension: Secondary | ICD-10-CM | POA: Diagnosis not present

## 2016-06-16 DIAGNOSIS — E1169 Type 2 diabetes mellitus with other specified complication: Secondary | ICD-10-CM | POA: Diagnosis not present

## 2016-06-16 DIAGNOSIS — L600 Ingrowing nail: Secondary | ICD-10-CM

## 2016-06-16 DIAGNOSIS — M79671 Pain in right foot: Secondary | ICD-10-CM

## 2016-06-16 DIAGNOSIS — M2042 Other hammer toe(s) (acquired), left foot: Secondary | ICD-10-CM

## 2016-06-16 DIAGNOSIS — M2041 Other hammer toe(s) (acquired), right foot: Secondary | ICD-10-CM

## 2016-06-16 DIAGNOSIS — M79672 Pain in left foot: Principal | ICD-10-CM

## 2016-06-16 MED ORDER — CANAGLIFLOZIN-METFORMIN HCL ER 150-500 MG PO TB24: 2.0000 | ORAL_TABLET | Freq: Every day | ORAL | 1 refills | Status: DC

## 2016-06-16 MED ORDER — FLUCONAZOLE 150 MG PO TABS: ORAL_TABLET | ORAL | 2 refills | Status: DC

## 2016-06-16 MED ORDER — TRIAMTERENE-HCTZ 50-25 MG PO CAPS: 1.0000 | ORAL_CAPSULE | ORAL | 1 refills | Status: DC

## 2016-06-16 MED ORDER — GLIPIZIDE ER 10 MG PO TB24: 10.0000 mg | ORAL_TABLET | Freq: Every day | ORAL | 1 refills | Status: DC

## 2016-06-16 MED ORDER — LOSARTAN POTASSIUM 25 MG PO TABS: 25.0000 mg | ORAL_TABLET | Freq: Every day | ORAL | 1 refills | Status: DC

## 2016-06-16 NOTE — Patient Instructions (Signed)
Nurse BP check ion 4 weeks  MD f/u in 3 month  HBA1C, chem 7 and eGFR in 4 months  New medication for diabetes is invokamet 150/1000 TWO daily, may take three 123m tabs invokana and two metformin 1000 mg tabs daily till done Fluconazole prescribed for once weekly use if needed only, as blood sugar improves, you will need this less  Ninety day supplies sent also test supplies for once daily testing   Goal for fasting blood sugar ranges from 80 to 120 and 2 hours after any meal or at bedtime should be between 130 to 170.   Please work on good  health habits so that your health will improve. 1. Commitment to daily physical activity for 30 to 60  minutes, if you are able to do this.  2. Commitment to wise food choices. Aim for half of your  food intake to be vegetable and fruit, one quarter starchy foods, and one quarter protein. Try to eat on a regular schedule  3 meals per day, snacking between meals should be limited to vegetables or fruits or small portions of nuts. 64 ounces of water per day is generally recommended, unless you have specific health conditions, like heart failure or kidney failure where you will need to limit fluid intake.  3. Commitment to sufficient and a  good quality of physical and mental rest daily, generally between 6 to 8 hours per day.  WITH PERSISTANCE AND PERSEVERANCE, THE IMPOSSIBLE , BECOMES THE NORM! Thank you  for choosing [facility]. We consider it a privelige to serve you.  Delivering excellent health care in a caring and  compassionate way is our goal.  Partnering with you,  so that together we can achieve this goal is our strategy.

## 2016-06-16 NOTE — Progress Notes (Signed)
   Subjective:    Patient ID: Christina [name], female    DOB: Jun 13, 1976, 40 y.o.   MRN: [id number]  HPI: She presents today with history of diabetes concerned about possible ingrown toenails to the second third toes of the left foot and third toe of the right foot she states they've been tender for a couple weeks for red swollen drainage they seem to be getting better. She's recently had a baby August but is no longer breast-feeding. She states that her blood sugar hemoglobin A1c was 7.0.    Review of Systems  Allergic/Immunologic: Positive for food allergies.  All other systems reviewed and are negative.      Objective:   Physical Exam: Vital signs are stable alert and oriented 3. Pulses are palpable. Neurologic sensorium is intact. Deep tendon reflexes are intact. Muscle is 5 over 5 dorsiflexion plantar flexors and inverters everters all of his musculature is intact. Orthopedic evaluation was resolved with a single 4 range of motion without crepitation. Cutaneous evaluation shows a well hydrated cutis he does have very small healing superficial abscesses to the tibial borders of the toes #2 and 3 and #3 on the right foot. These are healing I see no reason to perform matrixectomy is at this point.          Assessment & Plan:  Healing paronychia digits bilateral foot.  Plan: I trimmed the edges of the sites out today and debrided the abscesses is no signs of infection at this point I went ahead and recommended that she continue to soak in Epsom salts and warm water notify me within the next 3-4 weeks if the should start to grow out and we will perform matrixectomy's. At this point I feel that it may just been associated with water or fluid retention and tight shoe gear.

## 2016-06-16 NOTE — Progress Notes (Signed)
[place] [phone] Progress Note  SADIYA CUTTINO [id number] 40 y.o.  06/17/2016 11:53 AM  Chief Complaint:  "I am doing good"    History of Present Illness:  Jennessy came for her appointment.  Patient states that she is doing well since the last encounter. She delivered a baby in August 2017 and has not been able to come to the appointment. She states that she feels good most of the time, although she is sometimes overwhelmed at work. She went back to job after maternity leave and works as a support for students at [employer]. She reports that although she used to change job many times, she has been working there for 3 years. She relatively sleeps well despite taking care of her baby. She also takes care of two other children age 6 and 48. She reports good support from her husband and 6 grandparents. She reports fair appetite. She denies SI, AH/VH. She uses formula and is not planning to do breastfeeding anymore.   She reports history of decreased need for sleep, euphoria which lasted for a few weeks. She was diagnosed bipolar disorder in 2006 and denies any manic episode since then. She denies history of psychiatry admission or suicide attempt.   Suicidal Ideation: No Plan Formed: No Patient has means to carry out plan: No  Homicidal Ideation: No Plan Formed: No Patient has means to carry out plan: No  Medical History; Her primary care physician is Dr. Tula Nakayama.  Patient has history of diabetes, hypertension, gastroparesis, goiter, proteinuria and GERD.  Past Medical History:  Diagnosis Date  . Anemia   . Bipolar disorder ([place])   . Chronic kidney disease    proteinuria  . Diabetes mellitus    type 2 DM, insulin only needed during pregnancy  . Gastroparesis   . Genital herpes 02/22/2013   Suppressive rx starting @ 36 weeks   . 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 GERD (gastroesophageal reflux disease)   . Hypertension    chronic on meds    Past  Psychiatric History/Hospitalization(s) Patient has been seeing in this office since 2006. She was referred from her primary care physician Dr. Moshe Cipro. She has history of depression and mania and psychosis. In 1997 she was admitted at [facility] because of suicidal gesture.  In the past she has taken Prozac and Wellbutrin.  She denies any history of sexual, verbal emotional abuse.    Anxiety: Yes Bipolar Disorder: Yes Depression: Yes Mania: Yes Psychosis: Yes Schizophrenia: No Personality Disorder: No Hospitalization for psychiatric illness: Yes History of Electroconvulsive Shock Therapy: No Prior Suicide Attempts: No   Review of Systems  Skin: Negative for itching and rash.  Neurological: Negative.   Psychiatric/Behavioral: Negative for hallucinations, substance abuse and suicidal ideas. The patient is nervous/anxious. The patient does not have insomnia.    Psychiatric: Agitation: No Hallucination: No Depressed Mood: No Insomnia: No Hypersomnia: No Altered Concentration: No Feels Worthless: No Grandiose Ideas: No Belief In Special Powers: No New/Increased Substance Abuse: No Compulsions: No  Neurologic: Headache: No Seizure: No Paresthesias: No    Outpatient Encounter Prescriptions as of 06/17/2016  Medication Sig Dispense Refill  . acetaminophen (TYLENOL) 500 MG tablet Take 500 mg by mouth every 6 (six) hours as needed for mild pain.    . Canagliflozin-Metformin HCl ER (INVOKAMET XR) [phone] MG TB24 Take 2 tablets by mouth daily. 180 tablet 3  . Canagliflozin-Metformin HCl ER (INVOKAMET XR) [phone] MG TB24 Take 2 tablets by  mouth daily. 60 tablet 2  . docusate sodium (COLACE) 100 MG capsule Take 100 mg by mouth daily.    . ferrous sulfate 325 (65 FE) MG tablet Take 325 mg by mouth 2 (two) times daily with a meal.    . glipiZIDE (GLUCOTROL XL) 10 MG 24 hr tablet Take 1 tablet (10 mg total) by mouth daily with breakfast. 90 tablet 1  . loratadine (CLARITIN) 10 MG tablet  Take 10 mg by mouth daily. Taking generic otc allergy    . losartan (COZAAR) 25 MG tablet Take 1 tablet (25 mg total) by mouth daily. 90 tablet 1  . omeprazole (PRILOSEC) 20 MG capsule Take 1 capsule (20 mg total) by mouth daily. 90 capsule 3  . ONETOUCH DELICA LANCETS 99991111 MISC Once daily testing dx e11.9 100 each 5  . Prenatal Vit-Fe Fumarate-FA (MULTIVITAMIN-PRENATAL) 27-0.8 MG TABS tablet Take 1 tablet by mouth daily at 12 noon.    . ranitidine (ZANTAC) 150 MG tablet Take 1 tablet (150 mg total) by mouth 2 (two) times daily. 60 tablet 1  . triamterene-hydrochlorothiazide (DYAZIDE) 50-25 MG capsule Take 1 capsule by mouth every morning. 90 capsule 1  . [DISCONTINUED] Canagliflozin-Metformin HCl ER (INVOKAMET XR) 150-500 MG TB24 Take 2 tablets by mouth daily. 180 tablet 1  . [DISCONTINUED] fluconazole (DIFLUCAN) 150 MG tablet One tablet once weekly if needed, 4 tablet 2   No facility-administered encounter medications on file as of 06/17/2016.     Physical Exam: Constitutional:  BP (!) 148/88   Pulse 95   Ht 5\' 4"  (1.626 m)   Wt 236 lb 12.8 oz (107.4 kg)   LMP 05/22/2016   BMI 40.65 kg/m   Recent Results (from the past 2160 hour(s))  Hemoglobin A1c     Status: Abnormal   Collection Time: 06/12/16  8:19 AM  Result Value Ref Range   Hgb A1c MFr Bld 7.0 (H) <5.7 %    Comment:   For someone without known diabetes, a hemoglobin A1c value of 6.5% or greater indicates that they may have diabetes and this should be confirmed with a follow-up test.   For someone with known diabetes, a value <7% indicates that their diabetes is well controlled and a value greater than or equal to 7% indicates suboptimal control. A1c targets should be individualized based on duration of diabetes, age, comorbid conditions, and other considerations.   Currently, no consensus exists for use of hemoglobin A1c for diagnosis of diabetes for children.      Mean Plasma Glucose 154 mg/dL  BASIC METABOLIC  PANEL WITH GFR     Status: Abnormal   Collection Time: 06/12/16  8:19 AM  Result Value Ref Range   Sodium 138 135 - 146 mmol/L   Potassium 4.0 3.5 - 5.3 mmol/L   Chloride 105 98 - 110 mmol/L   CO2 25 20 - 31 mmol/L   Glucose, Bld 121 (H) 65 - 99 mg/dL   BUN 8 7 - 25 mg/dL   Creat 0.79 0.50 - 1.10 mg/dL   Calcium 8.9 8.6 - 10.2 mg/dL   GFR, Est African American >89 >=60 mL/min   GFR, Est Non African American >89 >=60 mL/min    Musculoskeletal: Strength & Muscle Tone: within normal limits Gait & Station: normal Patient leans: N/A  Mental Status Examination;  Patient is a middle-aged female who is casually dressed and fairly groomed.  She described her mood good and her affect is euthymic and appropriate.  Her speech is clear,  coherent. Her thought processes is logical and goal directed.  She denies any active or passive suicidal thoughts or homicidal thoughts.  There were no paranoia or delusions present at this time.  Her fund of knowledge is adequate.  She is alert and oriented x3.  Her attention and concentration is fair.  Her insight judgment and impulse control is okay.   Established Problem, Stable/Improving (1), Review or order clinical lab tests (1), Review of Last Therapy Session (1) and Review of Medication Regimen & Side Effects (2)  Assessment: ALEXSUS WEHRLE is a 41 year old female with bipolar disorder, diabetes, hypertension, GERD who presents for follow up appointment. Patient delivered a baby in August. She is a patient of [name].   She denies significant mood symptoms since the last encounter. Will restart Abilify given she is not doing breastfeeding. Risk of metabolic side effects (though less compared to other SGA) are discussed.   Plan:  1. Start Abilify 5 mg daily 2. Return to clinic in 2 months  The patient demonstrates the following risk factors for suicide: Chronic risk factors for suicide include: psychiatric disorder of bipolar disorder. Acute risk  factors for suicide include: N/A. Protective factors for this patient include: positive social support, coping skills and hope for the future. Considering these factors, the overall suicide risk at this point appears to be low. Patient is appropriate for outpatient follow up.  Norman Clay, MD 06/17/2016

## 2016-06-16 NOTE — Patient Instructions (Signed)

## 2016-06-17 ENCOUNTER — Other Ambulatory Visit: Payer: Self-pay | Admitting: Family Medicine

## 2016-06-17 ENCOUNTER — Ambulatory Visit (INDEPENDENT_AMBULATORY_CARE_PROVIDER_SITE_OTHER): Payer: 59 | Admitting: Psychiatry

## 2016-06-17 ENCOUNTER — Encounter (HOSPITAL_COMMUNITY): Payer: Self-pay | Admitting: Psychiatry

## 2016-06-17 ENCOUNTER — Encounter: Payer: Self-pay | Admitting: Family Medicine

## 2016-06-17 VITALS — BP 148/88 | HR 95 | Ht 64.0 in | Wt 236.8 lb

## 2016-06-17 DIAGNOSIS — Z79899 Other long term (current) drug therapy: Secondary | ICD-10-CM

## 2016-06-17 DIAGNOSIS — F3162 Bipolar disorder, current episode mixed, moderate: Secondary | ICD-10-CM

## 2016-06-17 MED ORDER — ARIPIPRAZOLE 5 MG PO TABS: 5.0000 mg | ORAL_TABLET | Freq: Every day | ORAL | 0 refills | Status: DC

## 2016-06-17 MED ORDER — CANAGLIFLOZIN-METFORMIN HCL ER 150-1000 MG PO TB24: 2.0000 | ORAL_TABLET | Freq: Every day | ORAL | 3 refills | Status: AC

## 2016-06-17 MED ORDER — CANAGLIFLOZIN-METFORMIN HCL ER 150-1000 MG PO TB24: 2.0000 | ORAL_TABLET | Freq: Every day | ORAL | 2 refills | Status: AC

## 2016-06-17 NOTE — Assessment & Plan Note (Signed)
Deteriorated. Increase dose of invokana Christina [name] is reminded of the importance of commitment to daily physical activity for 30 minutes or more, as able and the need to limit carbohydrate intake to 30 to 60 grams per meal to help with blood sugar control.   The need to take medication as prescribed, test blood sugar as directed, and to call between visits if there is a concern that blood sugar is uncontrolled is also discussed.   Christina [name] is reminded of the importance of daily foot exam, annual eye examination, and good blood sugar, blood pressure and cholesterol control.  Diabetic Labs Latest Ref Rng & Units 06/12/2016 03/16/2016 03/06/2016 03/05/2016 05/24/2015  HbA1c <5.7 % 7.0(H) 6.4(H) - - 7.8(H)  Microalbumin Not Estab. ug/mL - 29.9(H) - - -  Micro/Creat Ratio 0.0 - 30.0 mg/g creat - 15.2 - - -  Chol 125 - 200 mg/dL - 155 - - -  HDL >=46 mg/dL - 89 - - -  Calc LDL <130 mg/dL - 40 - - -  Triglycerides <150 mg/dL - 131 - - -  Creatinine 0.50 - 1.10 mg/dL 0.79 0.93 0.70 0.83 0.83   BP/Weight 06/16/2016 06/16/2016 06/01/2016 05/22/2016 04/09/2016 [id number]  Systolic BP [id number] [id number] 123456 [id number]  Diastolic BP 88 94 90 88 94 100 73  Wt. (Lbs) - 237 239.04 - - 247 -  BMI - 39.44 40.4 - - 42.4 -  Some encounter information is confidential and restricted. Go to Review Flowsheets activity to see all data.   Foot/eye exam completion dates Latest Ref Rng & Units 03/16/2016 12/05/2015  Eye Exam No Retinopathy - No Retinopathy  Foot Form Completion - Done -

## 2016-06-17 NOTE — Assessment & Plan Note (Signed)
Deteriorated. Patient re-educated about  the importance of commitment to a  minimum of 150 minutes of exercise per week.  The importance of healthy food choices with portion control discussed. Encouraged to start a food diary, count calories and to consider  joining a support group. Sample diet sheets offered. Goals set by the patient for the next several months.   Weight /BMI 06/16/2016 06/01/2016 03/16/2016  WEIGHT 237 lb 239 lb 0.6 oz 247 lb  HEIGHT 5\' 5"  5' 4.5" 5\' 4"   BMI 39.44 kg/m2 40.4 kg/m2 42.4 kg/m2  Some encounter information is confidential and restricted. Go to Review Flowsheets activity to see all data.

## 2016-06-17 NOTE — Progress Notes (Signed)
Christina [name]     MRN: [id number]      DOB: [birth year]   HPI Christina [name] is here for follow up and re-evaluation of chronic medical conditions, medication management and review of any available recent lab and radiology data.  Preventive health is updated, specifically  Cancer screening and Immunization.   Questions or concerns regarding consultations or procedures which the PT has had in the interim are  addressed. The PT denies any adverse reactions to current medications since the last visit.  There are no new concerns.  There are no specific complaints   ROS Denies recent fever or chills. Denies sinus pressure, nasal congestion, ear pain or sore throat. Denies chest congestion, productive cough or wheezing. Denies chest pains, palpitations and leg swelling Denies abdominal pain, nausea, vomiting,diarrhea or constipation.   Denies dysuria, frequency, hesitancy or incontinence. Denies joint pain, swelling and limitation in mobility. Denies headaches, seizures, numbness, or tingling. Denies depression, anxiety or insomnia. Denies skin break down or rash.   PE  BP (!) 146/94   Pulse 73   Resp 16   Ht 5\' 5"  (1.651 m)   Wt 237 lb (107.5 kg)   LMP 05/22/2016   SpO2 97%   BMI 39.44 kg/m   Patient alert and oriented and in no cardiopulmonary distress.  HEENT: No facial asymmetry, EOMI,   oropharynx pink and moist.  Neck supple no JVD, no mass.  Chest: Clear to auscultation bilaterally.  CVS: S1, S2 no murmurs, no S3.Regular rate.  ABD: Soft non tender.   Ext: No edema  MS: Adequate ROM spine, shoulders, hips and knees.  Skin: Intact, no ulcerations or rash noted.  Psych: Good eye contact, normal affect. Memory intact not anxious or depressed appearing.  CNS: CN 2-12 intact, power,  normal throughout.no focal deficits noted.   Assessment & Plan  Essential hypertension, benign Uncontrolled, medications changed to prepregnancy meds with renal protection Nurse BP  chnck in 4 weeks DASH diet and commitment to daily physical activity for a minimum of 30 minutes discussed and encouraged, as a part of hypertension management. The importance of attaining a healthy weight is also discussed.  BP/Weight 06/16/2016 06/16/2016 06/01/2016 05/22/2016 04/09/2016 [id number]  Systolic BP [id number] [id number] [id number] 123456 [id number]  Diastolic BP 88 94 90 88 94 100 73  Wt. (Lbs) - 237 239.04 - - 247 -  BMI - 39.44 40.4 - - 42.4 -  Some encounter information is confidential and restricted. Go to Review Flowsheets activity to see all data.       Diabetes mellitus type 2 in obese (HCC) Deteriorated. Increase dose of invokana Christina [name] is reminded of the importance of commitment to daily physical activity for 30 minutes or more, as able and the need to limit carbohydrate intake to 30 to 60 grams per meal to help with blood sugar control.   The need to take medication as prescribed, test blood sugar as directed, and to call between visits if there is a concern that blood sugar is uncontrolled is also discussed.   Christina [name] is reminded of the importance of daily foot exam, annual eye examination, and good blood sugar, blood pressure and cholesterol control.  Diabetic Labs Latest Ref Rng & Units 06/12/2016 03/16/2016 03/06/2016 03/05/2016 05/24/2015  HbA1c <5.7 % 7.0(H) 6.4(H) - - 7.8(H)  Microalbumin Not Estab. ug/mL - 29.9(H) - - -  Micro/Creat Ratio 0.0 - 30.0 mg/g creat - 15.2 - - -  Chol 125 -  200 mg/dL - 155 - - -  HDL >=46 mg/dL - 89 - - -  Calc LDL <130 mg/dL - 40 - - -  Triglycerides <150 mg/dL - 131 - - -  Creatinine 0.50 - 1.10 mg/dL 0.79 0.93 0.70 0.83 0.83   BP/Weight 06/16/2016 06/16/2016 06/01/2016 05/22/2016 04/09/2016 [id number] 0000000  Systolic BP A999333 [id number] [id number]  Diastolic BP 88 94 90 88 94 100 73  Wt. (Lbs) - 237 239.04 - - 247 -  BMI - 39.44 40.4 - - 42.4 -  Some encounter information is confidential and restricted. Go to Review Flowsheets  activity to see all data.   Foot/eye exam completion dates Latest Ref Rng & Units 03/16/2016 12/05/2015  Eye Exam No Retinopathy - No Retinopathy  Foot Form Completion - Done -        Morbid obesity Deteriorated. Patient re-educated about  the importance of commitment to a  minimum of 150 minutes of exercise per week.  The importance of healthy food choices with portion control discussed. Encouraged to start a food diary, count calories and to consider  joining a support group. Sample diet sheets offered. Goals set by the patient for the next several months.   Weight /BMI 06/16/2016 06/01/2016 03/16/2016  WEIGHT 237 lb 239 lb 0.6 oz 247 lb  HEIGHT 5\' 5"  5' 4.5" 5\' 4"   BMI 39.44 kg/m2 40.4 kg/m2 42.4 kg/m2  Some encounter information is confidential and restricted. Go to Review Flowsheets activity to see all data.

## 2016-06-17 NOTE — Assessment & Plan Note (Signed)
Uncontrolled, medications changed to prepregnancy meds with renal protection Nurse BP chnck in 4 weeks DASH diet and commitment to daily physical activity for a minimum of 30 minutes discussed and encouraged, as a part of hypertension management. The importance of attaining a healthy weight is also discussed.  BP/Weight 06/16/2016 06/16/2016 06/01/2016 05/22/2016 04/09/2016 [id number]  Systolic BP A999333 123456 [id number] 0000000 123456 [id number]  Diastolic BP 88 94 90 88 94 100 73  Wt. (Lbs) - 237 239.04 - - 247 -  BMI - 39.44 40.4 - - 42.4 -  Some encounter information is confidential and restricted. Go to Review Flowsheets activity to see all data.

## 2016-06-17 NOTE — Patient Instructions (Signed)
1. Start Abilify 5 mg daily 2. Return to clinic in 2 months

## 2016-06-19 ENCOUNTER — Other Ambulatory Visit: Payer: Self-pay | Admitting: Family Medicine

## 2016-06-19 MED ORDER — TRIAMTERENE-HCTZ 37.5-25 MG PO TABS: 1.0000 | ORAL_TABLET | Freq: Every day | ORAL | 1 refills | Status: DC

## 2016-06-22 ENCOUNTER — Ambulatory Visit: Payer: Self-pay | Admitting: Family Medicine

## 2016-07-10 ENCOUNTER — Other Ambulatory Visit: Payer: Self-pay

## 2016-07-10 MED ORDER — LORATADINE 10 MG PO TABS: 10.0000 mg | ORAL_TABLET | Freq: Every day | ORAL | 1 refills | Status: DC

## 2016-07-13 ENCOUNTER — Ambulatory Visit: Payer: 59

## 2016-07-13 VITALS — BP 122/84

## 2016-07-13 DIAGNOSIS — I10 Essential (primary) hypertension: Secondary | ICD-10-CM

## 2016-07-13 NOTE — Progress Notes (Signed)
BP is running much better. Advised to continue same meds and keep next appt

## 2016-07-21 ENCOUNTER — Ambulatory Visit: Payer: 59 | Admitting: Podiatry

## 2016-07-29 ENCOUNTER — Encounter: Payer: Self-pay | Admitting: Family Medicine

## 2016-07-30 ENCOUNTER — Telehealth: Payer: Self-pay

## 2016-07-30 DIAGNOSIS — E669 Obesity, unspecified: Secondary | ICD-10-CM

## 2016-07-30 DIAGNOSIS — E1169 Type 2 diabetes mellitus with other specified complication: Secondary | ICD-10-CM

## 2016-07-30 DIAGNOSIS — I10 Essential (primary) hypertension: Secondary | ICD-10-CM

## 2016-07-30 NOTE — Telephone Encounter (Signed)
Lab order changed per patient request

## 2016-08-04 ENCOUNTER — Telehealth: Payer: Self-pay

## 2016-08-04 MED ORDER — DIPHENOXYLATE-ATROPINE 2.5-0.025 MG PO TABS: 1.0000 | ORAL_TABLET | Freq: Four times a day (QID) | ORAL | 0 refills | Status: DC | PRN

## 2016-08-04 NOTE — Telephone Encounter (Signed)
Vomiting No.    Recommended treatment Hydration is important Fluids small frequent amounts as tolerated Good hygiene reduces transmission among family members Review Brat diet  Zofran 4 mg 1 tablet daily as needed for nausea and vomiting no more than 6 tablets   DiarrheaYes.    Recommended treatment  Imodium OTC  Can also offer Lomotil 1 tablet 4 times daily as needed no more than 10 tablets Good hygiene reduces transmission among family members Review Brat Diet  If patient starts to feel light headed or not passing much urine or becoming dehydrated will need to go to emergency room for IV hydration  Please call office if symptoms worsen or do not improve after 2-3 days

## 2016-08-18 ENCOUNTER — Ambulatory Visit (INDEPENDENT_AMBULATORY_CARE_PROVIDER_SITE_OTHER): Payer: 59 | Admitting: Psychiatry

## 2016-08-18 ENCOUNTER — Encounter (HOSPITAL_COMMUNITY): Payer: Self-pay | Admitting: Psychiatry

## 2016-08-18 VITALS — BP 142/88 | HR 94 | Ht 64.5 in | Wt 234.0 lb

## 2016-08-18 DIAGNOSIS — Z9889 Other specified postprocedural states: Secondary | ICD-10-CM | POA: Diagnosis not present

## 2016-08-18 DIAGNOSIS — Z888 Allergy status to other drugs, medicaments and biological substances status: Secondary | ICD-10-CM

## 2016-08-18 DIAGNOSIS — Z91010 Allergy to peanuts: Secondary | ICD-10-CM

## 2016-08-18 DIAGNOSIS — Z79899 Other long term (current) drug therapy: Secondary | ICD-10-CM

## 2016-08-18 DIAGNOSIS — F3162 Bipolar disorder, current episode mixed, moderate: Secondary | ICD-10-CM

## 2016-08-18 DIAGNOSIS — Z8249 Family history of ischemic heart disease and other diseases of the circulatory system: Secondary | ICD-10-CM | POA: Diagnosis not present

## 2016-08-18 DIAGNOSIS — Z833 Family history of diabetes mellitus: Secondary | ICD-10-CM | POA: Diagnosis not present

## 2016-08-18 DIAGNOSIS — Z825 Family history of asthma and other chronic lower respiratory diseases: Secondary | ICD-10-CM

## 2016-08-18 MED ORDER — ARIPIPRAZOLE 5 MG PO TABS: 5.0000 mg | ORAL_TABLET | Freq: Every day | ORAL | 0 refills | Status: DC

## 2016-08-18 NOTE — Progress Notes (Signed)
BH MD/PA/NP OP Progress Note  08/18/2016 10:22 AM Christina [name]  MRN:  [id number]  Chief Complaint:  Subjective:  I'm doing good.  I'm thinking to switch my job.  HPI: Patient came for her follow-up appointment.  She is taking Abilify 5 mg daily.  She admitted busy at home as taking care of 3 children.  She had a baby in August.  She has 41 year old and 58-year-old which keep her very busy.  She had a good support from her husband and kids grandparents.  She is taking to switch her job so she can get more money.  She also taking GRE today so she can do the Masters program.  Patient denies any irritability, mania, psychosis or any hallucination.  She sleeping good.  Her energy level is good.  She denies any paranoia or any hallucination.  She has no tremors shakes or any EPS.  Her appetite is okay.  Energy level is good.  She was to continue Abilify 5 mg daily.  She had blood work 2 months ago and her last hemoglobin A1c was 7.  She is taking medication for diabetes.  Visit Diagnosis:    ICD-9-CM ICD-10-CM   1. Bipolar 1 disorder, mixed, moderate (HCC) 296.62 F31.62 ARIPiprazole (ABILIFY) 5 MG tablet    Past Psychiatric History: Reviewed.  Past Medical History:  Past Medical History:  Diagnosis Date  . Anemia   . Bipolar disorder ([place])   . Chronic kidney disease    proteinuria  . Diabetes mellitus    type 2 DM, insulin only needed during pregnancy  . Gastroparesis   . Genital herpes 02/22/2013   Suppressive rx starting @ 36 weeks   . 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 GERD (gastroesophageal reflux disease)   . Hypertension    chronic on meds    Past Surgical History:  Procedure Laterality Date  . BREAST REDUCTION SURGERY  1994  . CESAREAN SECTION N/A 03/06/2016   Procedure: CESAREAN SECTION;  Surgeon: Ena Dawley, MD;  Location: [facility];  Service: Obstetrics;  Laterality: N/A;  . dermoid tumor  2000  . ESOPHAGOGASTRODUODENOSCOPY  07/01/2009   [id number] esphagus  without barrett's/dilation with 16 mm/mild erthyema in the antrum. mild chronic gastritis on path.   Theodoro Kalata FINGER RELEASE  Nov 2016  . TUBAL LIGATION Bilateral 03/06/2016   Procedure: BILATERAL TUBAL LIGATION;  Surgeon: Ena Dawley, MD;  Location: [facility];  Service: Obstetrics;  Laterality: Bilateral;    Family Psychiatric History: Reviewed.  Family History:  Family History  Problem Relation Age of Onset  . Diabetes Mother   . Hypertension Mother   . Fibromyalgia Mother   . Diabetes Father   . Hypertension Father   . Asthma Father   . Heart disease Father   . Colon cancer Neg Hx     Social History:  Social History   Social History  . Marital status: Married    Spouse name: N/A  . Number of children: N/A  . Years of education: N/A   Occupational History  . [employer]    Social History Main Topics  . Smoking status: Never Smoker  . Smokeless tobacco: Never Used     Comment: Never smoked   . Alcohol use No  . Drug use: No  . Sexual activity: Yes    Partners: Male    Birth control/ protection: Rhythm     Comment: Last Intercourse about 10 days ago  Other Topics Concern  . Not on file   Social History Narrative  . No narrative on file    Allergies:  Allergies  Allergen Reactions  . Other Anaphylaxis    Tree nuts  . Peanut Oil Anaphylaxis  . Peanut-Containing Drug Products Anaphylaxis  . Ace Inhibitors Cough    Pt on no contaception and is at reproductive age, discussed with nephrology also  . Invokana [Canagliflozin] Other (See Comments)     Worried about ketoacidosis  . Augmentin [Amoxicillin-Pot Clavulanate] Nausea And Vomiting    Causes stomach cramps. Patient can take amoxicillin, not Augmentin.    Metabolic Disorder Labs: Lab Results  Component Value Date   HGBA1C 7.0 (H) 06/12/2016   MPG 154 06/12/2016   MPG 137 03/16/2016   No results found for: PROLACTIN Lab Results  Component Value Date   CHOL 155  03/16/2016   TRIG 131 03/16/2016   HDL 89 03/16/2016   CHOLHDL 1.7 03/16/2016   VLDL 26 03/16/2016   LDLCALC 40 03/16/2016   LDLCALC 60 02/02/2015     Current Medications: Current Outpatient Prescriptions  Medication Sig Dispense Refill  . acetaminophen (TYLENOL) 500 MG tablet Take 500 mg by mouth every 6 (six) hours as needed for mild pain.    . ARIPiprazole (ABILIFY) 5 MG tablet Take 1 tablet (5 mg total) by mouth daily. 90 tablet 0  . docusate sodium (COLACE) 100 MG capsule Take 100 mg by mouth daily.    Marland Kitchen glipiZIDE (GLUCOTROL XL) 10 MG 24 hr tablet Take 1 tablet (10 mg total) by mouth daily with breakfast. 90 tablet 1  . loratadine (CLARITIN) 10 MG tablet Take 1 tablet (10 mg total) by mouth daily. Taking generic otc allergy 90 tablet 1  . losartan (COZAAR) 25 MG tablet Take 1 tablet (25 mg total) by mouth daily. 90 tablet 1  . omeprazole (PRILOSEC) 20 MG capsule Take 1 capsule (20 mg total) by mouth daily. 90 capsule 3  . ONETOUCH DELICA LANCETS 99991111 MISC Once daily testing dx e11.9 100 each 5  . ranitidine (ZANTAC) 150 MG tablet Take 1 tablet (150 mg total) by mouth 2 (two) times daily. 60 tablet 1  . triamterene-hydrochlorothiazide (MAXZIDE-25) 37.5-25 MG tablet Take 1 tablet by mouth daily. 90 tablet 1  . valACYclovir (VALTREX) 500 MG tablet      No current facility-administered medications for this visit.     Neurologic: Headache: No Seizure: No Paresthesias: No  Musculoskeletal: Strength & Muscle Tone: within normal limits Gait & Station: normal Patient leans: N/A  Psychiatric Specialty Exam: Review of Systems  Constitutional: Negative.   HENT: Negative.   Musculoskeletal: Negative.   Neurological: Negative.     Blood pressure (!) 142/88, pulse 94, height 5' 4.5" (1.638 m), weight 234 lb (106.1 kg), unknown if currently breastfeeding.Body mass index is 39.55 kg/m.  General Appearance: Casual  Eye Contact:  Good  Speech:  Clear and Coherent  Volume:  Normal   Mood:  Euthymic  Affect:  Appropriate and Congruent  Thought Process:  Goal Directed  Orientation:  Full (Time, Place, and Person)  Thought Content: WDL and Logical   Suicidal Thoughts:  No  Homicidal Thoughts:  No  Memory:  Immediate;   Good Recent;   Good Remote;   Good  Judgement:  Good  Insight:  Good  Psychomotor Activity:  Normal  Concentration:  Concentration: Good and Attention Span: Good  Recall:  Good  Fund of Knowledge: Good  Language: Good  Akathisia:  No  Handed:  Right  AIMS (if indicated):  0  Assets:  Communication Skills Desire for Improvement Housing Physical Health Resilience Social Support Transportation Vocational/Educational  ADL's:  Intact  Cognition: WNL  Sleep:  fair   Assessment: Bipolar disorder type I.  Plan: Patient is a stable on Abilify 5 mg daily.  She has no side effects.  She has no tremors shakes or any EPS.  She will have another hemoglobin A1c in February.  Discussed meditation side effects and benefits.  Recommended to call us back if she has any question, concern if she feels worsening of the symptom.  Follow-up in 3 months.   Chayden Garrelts T., MD 08/18/2016, 10:22 AM

## 2016-09-10 ENCOUNTER — Other Ambulatory Visit: Payer: Self-pay | Admitting: Family Medicine

## 2016-09-11 LAB — BMP8+EGFR
BUN/Creatinine Ratio: 9 (ref 9–23)
BUN/Creatinine Ratio: 9 (ref 9–23)
BUN: 9 mg/dL (ref 6–24)
BUN: 9 mg/dL (ref 6–24)
CO2: 22 mmol/L (ref 18–29)
CO2: 22 mmol/L (ref 18–29)
Calcium: 9.5 mg/dL (ref 8.7–10.2)
Calcium: 9.5 mg/dL (ref 8.7–10.2)
Chloride: 100 mmol/L (ref 96–106)
Chloride: 100 mmol/L (ref 96–106)
Creatinine, Ser: 0.98 mg/dL (ref 0.57–1.00)
Creatinine, Ser: 0.98 mg/dL (ref 0.57–1.00)
GFR calc Af Amer: 83 mL/min/{1.73_m2} (ref >59)
GFR calc Af Amer: 83 mL/min/{1.73_m2} (ref >59)
GFR calc non Af Amer: 72 mL/min/{1.73_m2} (ref >59)
GFR calc non Af Amer: 72 mL/min/{1.73_m2} (ref >59)
Glucose: 146 mg/dL — ABNORMAL HIGH (ref 65–99)
Glucose: 146 mg/dL — ABNORMAL HIGH (ref 65–99)
Potassium: 3.7 mmol/L (ref 3.5–5.2)
Potassium: 3.7 mmol/L (ref 3.5–5.2)
Sodium: 139 mmol/L (ref 134–144)
Sodium: 139 mmol/L (ref 134–144)

## 2016-09-11 LAB — HEMOGLOBIN A1C
Est. average glucose Bld gHb Est-mCnc: 183 mg/dL
Hgb A1c MFr Bld: 8 % — ABNORMAL HIGH (ref 4.8–5.6)

## 2016-09-15 ENCOUNTER — Ambulatory Visit (INDEPENDENT_AMBULATORY_CARE_PROVIDER_SITE_OTHER): Payer: 59 | Admitting: Family Medicine

## 2016-09-15 ENCOUNTER — Encounter: Payer: Self-pay | Admitting: Family Medicine

## 2016-09-15 VITALS — BP 124/84 | HR 96 | Resp 16 | Ht 65.0 in | Wt 234.0 lb

## 2016-09-15 DIAGNOSIS — E1169 Type 2 diabetes mellitus with other specified complication: Secondary | ICD-10-CM | POA: Diagnosis not present

## 2016-09-15 DIAGNOSIS — F3162 Bipolar disorder, current episode mixed, moderate: Secondary | ICD-10-CM

## 2016-09-15 DIAGNOSIS — E669 Obesity, unspecified: Secondary | ICD-10-CM

## 2016-09-15 DIAGNOSIS — D508 Other iron deficiency anemias: Secondary | ICD-10-CM | POA: Diagnosis not present

## 2016-09-15 DIAGNOSIS — K219 Gastro-esophageal reflux disease without esophagitis: Secondary | ICD-10-CM

## 2016-09-15 DIAGNOSIS — I10 Essential (primary) hypertension: Secondary | ICD-10-CM | POA: Diagnosis not present

## 2016-09-15 NOTE — Assessment & Plan Note (Signed)
Advised to take iron supplement. Updated lab needed at/ before next visit.

## 2016-09-15 NOTE — Assessment & Plan Note (Signed)
Controlled, no change in medication  

## 2016-09-15 NOTE — Assessment & Plan Note (Addendum)
Dimproved Patient re-educated about  the importance of commitment to a  minimum of 150 minutes of exercise per week.  The importance of healthy food choices with portion control discussed. Encouraged to start a food diary, count calories and to consider  joining a support group. Sample diet sheets offered. Goals set by the patient for the next several months.   Weight /BMI 09/15/2016 06/16/2016 06/01/2016  WEIGHT 234 lb 237 lb 239 lb 0.6 oz  HEIGHT 5\' 5"  5\' 5"  5' 4.5"  BMI 38.94 kg/m2 39.44 kg/m2 40.4 kg/m2  Some encounter information is confidential and restricted. Go to Review Flowsheets activity to see all data.

## 2016-09-15 NOTE — Progress Notes (Signed)
Christina [name]     MRN: [id number]      DOB: [birth year]   HPI Christina [name] is here for follow up and re-evaluation of chronic medical conditions, medication management and review of any available recent lab and radiology data.  Preventive health is updated, specifically  Cancer screening and Immunization.   . The PT denies any adverse reactions to current medications since the last visit.  States bloosd sugar varies a lot,  Sometimes very high, and reports over eating carbs, states she is afraid she bottoms out Mentally challenged and overwhelmed in past 3 to 4 months C/o left ear pain, no hearing loss or drainage   ROS Denies recent fever or chills. Denies sinus pressure, nasal congestion, ear pain or sore throat. Denies chest congestion, productive cough or wheezing. Denies chest pains, palpitations and leg swelling Denies abdominal pain, nausea, vomiting,diarrhea or constipation.   Denies dysuria, frequency, hesitancy or incontinence. Denies joint pain, swelling and limitation in mobility. Denies headaches, seizures, numbness, or tingling. Denies depression, anxiety or insomnia. Denies skin break down or rash.   PE  BP 124/84   Pulse 96   Resp 16   Ht 5\' 5"  (1.651 m)   Wt 234 lb (106.1 kg)   SpO2 96%   BMI 38.94 kg/m   Patient alert and oriented and in no cardiopulmonary distress.  HEENT: No facial asymmetry, EOMI,   oropharynx pink and moist.  Neck supple no JVD, no mass.Left tM clear bilaterally with good lihgt reflex, no nodule seen inside of ears Chest: Clear to auscultation bilaterally.  CVS: S1, S2 no murmurs, no S3.Regular rate.  ABD: Soft non tender.   Ext: No edema  MS: Adequate ROM spine, shoulders, hips and knees.  Skin: Intact, no ulcerations or rash noted.  Psych: Good eye contact, normal affect. Memory intact not anxious mildly depressed appearing.  CNS: CN 2-12 intact, power,  normal throughout.no focal deficits noted.   Assessment &  Plan  Essential hypertension, benign Controlled, no change in medication DASH diet and commitment to daily physical activity for a minimum of 30 minutes discussed and encouraged, as a part of hypertension management. The importance of attaining a healthy weight is also discussed.  BP/Weight 09/15/2016 07/13/2016 06/16/2016 06/16/2016 06/01/2016 [id number] [id number]  Systolic BP A999333 [id number] [id number]  Diastolic BP 84 84 88 94 90 88 94  Wt. (Lbs) 234 - - 237 239.04 - -  BMI 38.94 - - 39.44 40.4 - -  Some encounter information is confidential and restricted. Go to Review Flowsheets activity to see all data.       Diabetes mellitus type 2 in obese [facility]) Deteriorated Christina [name] is reminded of the importance of commitment to daily physical activity for 30 minutes or more, as able and the need to limit carbohydrate intake to 30 to 60 grams per meal to help with blood sugar control.   The need to take medication as prescribed, test blood sugar as directed, and to call between visits if there is a concern that blood sugar is uncontrolled is also discussed.   Christina [name] is reminded of the importance of daily foot exam, annual eye examination, and good blood sugar, blood pressure and cholesterol control.  Diabetic Labs Latest Ref Rng & Units 09/10/2016 09/10/2016 06/12/2016 03/16/2016 03/06/2016  HbA1c 4.8 - 5.6 % - 8.0(H) 7.0(H) 6.4(H) -  Microalbumin Not Estab. ug/mL - - - 29.9(H) -  Micro/Creat Ratio 0.0 - 30.0 mg/g creat - - -  15.2 -  Chol 125 - 200 mg/dL - - - 155 -  HDL >=46 mg/dL - - - 89 -  Calc LDL <130 mg/dL - - - 40 -  Triglycerides <150 mg/dL - - - 131 -  Creatinine 0.57 - 1.00 mg/dL 0.98 0.98 0.79 0.93 0.70   BP/Weight 09/15/2016 07/13/2016 06/16/2016 06/16/2016 06/01/2016 A999333 Q000111Q  Systolic BP A999333 [id number] A999333 [id number] 0000000 123456  Diastolic BP 84 84 88 94 90 88 94  Wt. (Lbs) 234 - - 237 239.04 - -  BMI 38.94 - - 39.44 40.4 - -  Some encounter information is confidential and  restricted. Go to Review Flowsheets activity to see all data.   Foot/eye exam completion dates Latest Ref Rng & Units 03/16/2016 12/05/2015  Eye Exam No Retinopathy - No Retinopathy  Foot Form Completion - Done -   No med change, referral to diabetic educator    Morbid obesity Dimproved Patient re-educated about  the importance of commitment to a  minimum of 150 minutes of exercise per week.  The importance of healthy food choices with portion control discussed. Encouraged to start a food diary, count calories and to consider  joining a support group. Sample diet sheets offered. Goals set by the patient for the next several months.   Weight /BMI 09/15/2016 06/16/2016 06/01/2016  WEIGHT 234 lb 237 lb 239 lb 0.6 oz  HEIGHT 5\' 5"  5\' 5"  5' 4.5"  BMI 38.94 kg/m2 39.44 kg/m2 40.4 kg/m2  Some encounter information is confidential and restricted. Go to Review Flowsheets activity to see all data.      Iron deficiency anemia Advised to take iron supplement. Updated lab needed at/ before next visit.   GERD Controlled, no change in medication   Bipolar 1 disorder, mixed, moderate (HCC) Reports challenging period in last 3 months, managed by psych, currently struggling with job performance and depressed with regard to diabetic control. States intends to start grad school to do a masters in counselling

## 2016-09-15 NOTE — Patient Instructions (Addendum)
F/u in 3 month, call if you need me before  HBA1C, chem 7 and EGFR ([facility]) CBC, iron and ferritin non fast in 3 month    Yoi are referred to diabetic educator  Goal for fasting blood sugar ranges from 80 to 120 and 2 hours after any meal or at bedtime should be between 130 to 170.   It is important that you exercise regularly at least 30 minutes 5 times a week. If you develop chest pain, have severe difficulty breathing, or feel very tired, stop exercising immediately and seek medical attention   Please work on good  health habits so that your health will improve. 1. Commitment to daily physical activity for 30 to 60  minutes, if you are able to do this.  2. Commitment to wise food choices. Aim for half of your  food intake to be vegetable and fruit, one quarter starchy foods, and one quarter protein. Try to eat on a regular schedule  3 meals per day, snacking between meals should be limited to vegetables or fruits or small portions of nuts. 64 ounces of water per day is generally recommended, unless you have specific health conditions, like heart failure or kidney failure where you will need to limit fluid intake.  3. Commitment to sufficient and a  good quality of physical and mental rest daily, generally between 6 to 8 hours per day.  WITH PERSISTANCE AND PERSEVERANCE, THE IMPOSSIBLE , BECOMES THE NORM!

## 2016-09-15 NOTE — Assessment & Plan Note (Signed)
Deteriorated Christina [name] is reminded of the importance of commitment to daily physical activity for 30 minutes or more, as able and the need to limit carbohydrate intake to 30 to 60 grams per meal to help with blood sugar control.   The need to take medication as prescribed, test blood sugar as directed, and to call between visits if there is a concern that blood sugar is uncontrolled is also discussed.   Christina [name] is reminded of the importance of daily foot exam, annual eye examination, and good blood sugar, blood pressure and cholesterol control.  Diabetic Labs Latest Ref Rng & Units 09/10/2016 09/10/2016 06/12/2016 03/16/2016 03/06/2016  HbA1c 4.8 - 5.6 % - 8.0(H) 7.0(H) 6.4(H) -  Microalbumin Not Estab. ug/mL - - - 29.9(H) -  Micro/Creat Ratio 0.0 - 30.0 mg/g creat - - - 15.2 -  Chol 125 - 200 mg/dL - - - 155 -  HDL >=46 mg/dL - - - 89 -  Calc LDL <130 mg/dL - - - 40 -  Triglycerides <150 mg/dL - - - 131 -  Creatinine 0.57 - 1.00 mg/dL 0.98 0.98 0.79 0.93 0.70   BP/Weight 09/15/2016 07/13/2016 06/16/2016 06/16/2016 06/01/2016 A999333 Q000111Q  Systolic BP A999333 123XX123 A999333 [id number] 0000000 123456  Diastolic BP 84 84 88 94 90 88 94  Wt. (Lbs) 234 - - 237 239.04 - -  BMI 38.94 - - 39.44 40.4 - -  Some encounter information is confidential and restricted. Go to Review Flowsheets activity to see all data.   Foot/eye exam completion dates Latest Ref Rng & Units 03/16/2016 12/05/2015  Eye Exam No Retinopathy - No Retinopathy  Foot Form Completion - Done -   No med change, referral to diabetic educator

## 2016-09-15 NOTE — Assessment & Plan Note (Signed)
Controlled, no change in medication DASH diet and commitment to daily physical activity for a minimum of 30 minutes discussed and encouraged, as a part of hypertension management. The importance of attaining a healthy weight is also discussed.  BP/Weight 09/15/2016 07/13/2016 06/16/2016 06/16/2016 06/01/2016 A999333 Q000111Q  Systolic BP A999333 [id number] A999333 123456 [id number] 0000000 123456  Diastolic BP 84 84 88 94 90 88 94  Wt. (Lbs) 234 - - 237 239.04 - -  BMI 38.94 - - 39.44 40.4 - -  Some encounter information is confidential and restricted. Go to Review Flowsheets activity to see all data.

## 2016-09-15 NOTE — Assessment & Plan Note (Signed)
Reports challenging period in last 3 months, managed by psych, currently struggling with job performance and depressed with regard to diabetic control. States intends to start grad school to do a masters in counselling

## 2016-09-22 ENCOUNTER — Telehealth: Payer: Self-pay

## 2016-09-22 ENCOUNTER — Encounter: Payer: Self-pay | Admitting: Gastroenterology

## 2016-09-22 MED ORDER — OMEPRAZOLE 20 MG PO CPDR: 20.0000 mg | DELAYED_RELEASE_CAPSULE | Freq: Every day | ORAL | 3 refills | Status: DC

## 2016-09-22 NOTE — Telephone Encounter (Signed)
Done

## 2016-09-22 NOTE — Telephone Encounter (Signed)
Pt sent email requesting Omeprazole 3 month supply be sent to [facility]. (see pt email if needed)

## 2016-09-27 ENCOUNTER — Other Ambulatory Visit: Payer: Self-pay | Admitting: Family Medicine

## 2016-09-29 ENCOUNTER — Ambulatory Visit: Payer: Self-pay | Admitting: Family Medicine

## 2016-10-01 DIAGNOSIS — Z8709 Personal history of other diseases of the respiratory system: Secondary | ICD-10-CM

## 2016-10-01 HISTORY — DX: Personal history of other diseases of the respiratory system: Z87.09

## 2016-10-13 ENCOUNTER — Encounter: Payer: Self-pay | Admitting: Family Medicine

## 2016-10-13 ENCOUNTER — Ambulatory Visit (INDEPENDENT_AMBULATORY_CARE_PROVIDER_SITE_OTHER): Payer: 59 | Admitting: Family Medicine

## 2016-10-13 VITALS — BP 130/88 | HR 92 | Temp 98.9°F | Resp 16 | Ht 65.0 in | Wt 235.0 lb

## 2016-10-13 DIAGNOSIS — J029 Acute pharyngitis, unspecified: Secondary | ICD-10-CM

## 2016-10-13 DIAGNOSIS — H6691 Otitis media, unspecified, right ear: Secondary | ICD-10-CM | POA: Insufficient documentation

## 2016-10-13 DIAGNOSIS — I10 Essential (primary) hypertension: Secondary | ICD-10-CM | POA: Diagnosis not present

## 2016-10-13 DIAGNOSIS — H6591 Unspecified nonsuppurative otitis media, right ear: Secondary | ICD-10-CM | POA: Diagnosis not present

## 2016-10-13 DIAGNOSIS — J0100 Acute maxillary sinusitis, unspecified: Secondary | ICD-10-CM

## 2016-10-13 DIAGNOSIS — E669 Obesity, unspecified: Secondary | ICD-10-CM

## 2016-10-13 DIAGNOSIS — E1169 Type 2 diabetes mellitus with other specified complication: Secondary | ICD-10-CM

## 2016-10-13 LAB — POCT RAPID STREP A (OFFICE): Rapid Strep A Screen: NEGATIVE

## 2016-10-13 MED ORDER — LEVOFLOXACIN 500 MG PO TABS: 500.0000 mg | ORAL_TABLET | Freq: Every day | ORAL | 0 refills | Status: DC

## 2016-10-13 MED ORDER — FLUCONAZOLE 150 MG PO TABS: ORAL_TABLET | ORAL | 0 refills | Status: DC

## 2016-10-13 MED ORDER — GLIPIZIDE ER 10 MG PO TB24: ORAL_TABLET | ORAL | 3 refills | Status: DC

## 2016-10-13 NOTE — Assessment & Plan Note (Signed)
Controlled, no change in medication DASH diet and commitment to daily physical activity for a minimum of 30 minutes discussed and encouraged, as a part of hypertension management. The importance of attaining a healthy weight is also discussed.  BP/Weight 10/13/2016 09/15/2016 07/13/2016 06/16/2016 06/16/2016 06/01/2016 15/11/1362  Systolic BP 383 779 396 886 484 720 721  Diastolic BP 88 84 84 88 94 90 88  Wt. (Lbs) 235 234 - - 237 239.04 -  BMI 39.11 38.94 - - 39.44 40.4 -  Some encounter information is confidential and restricted. Go to Review Flowsheets activity to see all data.

## 2016-10-13 NOTE — Assessment & Plan Note (Signed)
levaquin x 1 week, and work excuse to return in 3 days

## 2016-10-13 NOTE — Patient Instructions (Signed)
F/u as before,  Call if you need me sooner  You are treated for  Acute sinusitis and Right ear infection, , 1 week course of levaquin and 3 fluconazole tablets for as needed, use are sent to your local pharmacy.  Rapid strep test is negative  Increase glipizide to 20 mg every morning and please work on behavioral changes that we discussed.  Work excuse from today to return this Friday  Thank you  for choosing [facility]. We consider it a privelige to serve you.  Delivering excellent health care in a caring and  compassionate way is our goal.  Partnering with you,  so that together we can achieve this goal is our strategy.

## 2016-10-13 NOTE — Addendum Note (Signed)
Addended by: Eual Fines on: 10/13/2016 12:50 PM   Modules accepted: Orders

## 2016-10-13 NOTE — Progress Notes (Signed)
   Christina [name]     MRN: [id number]      DOB: 11/25/1975   HPI Christina [name] is here with a 3 day h/o headache and facial pressure, thick sinius drainage , green and bloody at times, bilateral ear pain, right worse than left C/o sore throat, fever and chills x  2 days, children are ill.at home C/o increased blood sugar, "grazing" in the evenings, will consider psychotherapy, discussed behavioral changes. FBG around 180, dose increase in the glipizide to 20 mg daily  ROS See HPI . Denies chest congestion, productive cough or wheezing. Denies chest pains, palpitations and leg swelling Denies abdominal pain, nausea, vomiting,diarrhea or constipation.   Denies dysuria, frequency, hesitancy or incontinence. Denies joint pain, swelling and limitation in mobility. Denies headaches, seizures, numbness, or tingling. Denies depression, anxiety or insomnia. Denies skin break down or rash.   PE  BP 130/88   Pulse 92   Temp 98.9 F (37.2 C) (Oral)   Resp 16   Ht 5\' 5"  (1.651 m)   Wt 235 lb (106.6 kg)   SpO2 95%   BMI 39.11 kg/m   Patient alert and oriented and ill appearing no cardiopulmonary distress.  HEENT: No facial asymmetry, frontal and maxillary sinus pressure,EOMI,   oropharynx erythematous, no exudate and moist.  Neck supple ,bilateral adenopathy,no JVD, no mass. RightTtM erythematous, normal light reflex bilaterally Chest: Clear to auscultation bilaterally.  CVS: S1, S2 no murmurs, no S3.Regular rate.  ABD: Soft non tender.   Ext: No edema  MS: Adequate ROM spine, shoulders, hips and knees.  Skin: Intact, no ulcerations or rash noted.  Psych: Good eye contact, normal affect. Memory intact not anxious or depressed appearing.  CNS: CN 2-12 intact, power,  normal throughout.no focal deficits noted.   Assessment & Plan  ROM (right otitis media) 1 week of levaquin prescribed  Acute maxillary sinusitis levaquin x 1 week, and work excuse to return in 3 days  Diabetes  mellitus type 2 in obese (HCC) Uncontrolled, reports FBG around 180, states behavior as far as eating is uncontrolled Encouraged behavior modification including seeing a therapist, Increase glipizide to 20 mg daily  Essential hypertension, benign Controlled, no change in medication DASH diet and commitment to daily physical activity for a minimum of 30 minutes discussed and encouraged, as a part of hypertension management. The importance of attaining a healthy weight is also discussed.  BP/Weight 10/13/2016 09/15/2016 07/13/2016 06/16/2016 06/16/2016 06/01/2016 50/35/4656  Systolic BP 812 751 700 174 944 967 591  Diastolic BP 88 84 84 88 94 90 88  Wt. (Lbs) 235 234 - - 237 239.04 -  BMI 39.11 38.94 - - 39.44 40.4 -  Some encounter information is confidential and restricted. Go to Review Flowsheets activity to see all data.

## 2016-10-13 NOTE — Assessment & Plan Note (Signed)
1 week of levaquin prescribed

## 2016-10-13 NOTE — Assessment & Plan Note (Signed)
Uncontrolled, reports FBG around 180, states behavior as far as eating is uncontrolled Encouraged behavior modification including seeing a therapist, Increase glipizide to 20 mg daily

## 2016-10-14 ENCOUNTER — Telehealth: Payer: Self-pay

## 2016-10-14 NOTE — Telephone Encounter (Signed)
Wants to know if she can also have meclizine sent in for vertigo.

## 2016-10-16 ENCOUNTER — Encounter: Payer: Self-pay | Admitting: Family Medicine

## 2016-10-16 ENCOUNTER — Ambulatory Visit (INDEPENDENT_AMBULATORY_CARE_PROVIDER_SITE_OTHER): Payer: 59 | Admitting: Family Medicine

## 2016-10-16 VITALS — BP 128/80 | HR 96 | Temp 98.5°F | Resp 18 | Ht 65.0 in | Wt 232.1 lb

## 2016-10-16 DIAGNOSIS — J0100 Acute maxillary sinusitis, unspecified: Secondary | ICD-10-CM

## 2016-10-16 DIAGNOSIS — R42 Dizziness and giddiness: Secondary | ICD-10-CM | POA: Diagnosis not present

## 2016-10-16 MED ORDER — MECLIZINE HCL 32 MG PO TABS: 32.0000 mg | ORAL_TABLET | Freq: Three times a day (TID) | ORAL | 0 refills | Status: DC | PRN

## 2016-10-16 NOTE — Patient Instructions (Signed)
Take meclizine as needed dizziness Finish levaquin Push fluids Take a mucinex WITH decongestant for a couple of days Use a saline nasal wash Call if not better in a couple of days

## 2016-10-16 NOTE — Progress Notes (Signed)
Chief Complaint  Patient presents with  . Ear Fullness    right   Has been seen this week for sinus infection.  Is taking Levaquin as directed Today is feeling worse with pressure and pain in face, pressure in R ear, dizziness, "tunnel vision" and nausea.  Her sinuses feel very full and congested and are not draining.  Cant breathe through nose.  Isn't taking anything else for the infection.  Patient Active Problem List   Diagnosis Date Noted  . ROM (right otitis media) 10/13/2016  . Bipolar 1 disorder, mixed, moderate ([place]) 06/17/2016  . Diabetes mellitus type 2 in obese ([place]) 03/16/2016  . Acute maxillary sinusitis 05/03/2015  . Bipolar 1 disorder ([place]) 11/19/2014  . Fatty liver 10/21/2013  . Seasonal allergies 09/26/2013  . Essential hypertension, benign 08/09/2013  . Iron deficiency anemia 05/15/2013  . Family history of blood disorder 02/28/2013  . 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 GASTROPARESIS 09/02/2009  . GERD 01/27/2009  . Morbid obesity ([place]) 01/05/2008    Outpatient Encounter Prescriptions as of 10/16/2016  Medication Sig  . acetaminophen (TYLENOL) 500 MG tablet Take 500 mg by mouth every 6 (six) hours as needed for mild pain.  . ARIPiprazole (ABILIFY) 5 MG tablet Take 1 tablet (5 mg total) by mouth daily.  . Canagliflozin-Metformin HCl (INVOKAMET) [phone] MG TABS Two tablets once daily at breakfast  . docusate sodium (COLACE) 100 MG capsule Take 100 mg by mouth daily.  . fluconazole (DIFLUCAN) 150 MG tablet One tablet once daily for vaginal itch associated with antibiotic use  . glipiZIDE (GLUCOTROL XL) 10 MG 24 hr tablet Two tablets every morning with breakfast  . levofloxacin (LEVAQUIN) 500 MG tablet Take 1 tablet (500 mg total) by mouth daily.  Marland Kitchen loratadine (CLARITIN) 10 MG tablet Take 1 tablet (10 mg total) by mouth daily. Taking generic otc allergy  . losartan (COZAAR) 25 MG tablet TAKE 1 TABLET BY MOUTH  DAILY  .  omeprazole (PRILOSEC) 20 MG capsule Take 1 capsule (20 mg total) by mouth daily.  Glory Rosebush DELICA LANCETS 62B MISC Once daily testing dx e11.9  . ranitidine (ZANTAC) 150 MG tablet Take 1 tablet (150 mg total) by mouth 2 (two) times daily.  Marland Kitchen triamterene-hydrochlorothiazide (MAXZIDE-25) 37.5-25 MG tablet TAKE 1 TABLET BY MOUTH  EVERY DAY  . valACYclovir (VALTREX) 500 MG tablet   . meclizine (ANTIVERT) 32 MG tablet Take 1 tablet (32 mg total) by mouth 3 (three) times daily as needed.   No facility-administered encounter medications on file as of 10/16/2016.     Allergies  Allergen Reactions  . Other Anaphylaxis    Tree nuts  . Peanut Oil Anaphylaxis  . Peanut-Containing Drug Products Anaphylaxis  . Ace Inhibitors Cough    Pt on no contaception and is at reproductive age, discussed with nephrology also  . Invokana [Canagliflozin] Other (See Comments)     Worried about ketoacidosis  . Augmentin [Amoxicillin-Pot Clavulanate] Nausea And Vomiting    Causes stomach cramps. Patient can take amoxicillin, not Augmentin.    Review of Systems  Constitutional: Negative for chills, fatigue and fever.  HENT: Positive for congestion, ear pain, hearing loss, sinus pain and sinus pressure. Negative for postnasal drip and rhinorrhea.   Eyes: Negative for redness and visual disturbance.  Respiratory: Negative for cough and shortness of breath.   Cardiovascular: Negative for chest pain and palpitations.  Gastrointestinal: Positive for nausea. Negative for vomiting.  Genitourinary: Negative for  difficulty urinating and frequency.  Musculoskeletal: Negative for arthralgias and myalgias.  Neurological: Positive for dizziness and headaches.  Hematological: Negative for adenopathy. Does not bruise/bleed easily.    BP 128/80 (BP Location: Right Arm, Patient Position: Sitting, Cuff Size: Normal)   Pulse 96   Temp 98.5 F (36.9 C) (Temporal)   Resp 18   Ht 5\' 5"  (1.651 m)   Wt 232 lb 1.3 oz (105.3 kg)    LMP 10/09/2016 (Exact Date)   SpO2 97%   BMI 38.62 kg/m   Physical Exam  Constitutional: She is oriented to person, place, and time. She appears well-developed and well-nourished. No distress.  HENT:  Head: Normocephalic and atraumatic.  Left Ear: External ear normal.  Mouth/Throat: Oropharynx is clear and moist.  Canals are clear, Right TM is injected and dull, sinuses tender to palo, nasal membranes red and swollen  Eyes: Pupils are equal, round, and reactive to light.  Positive nystagmus to L  Neck: Normal range of motion.  Cardiovascular: Normal rate, regular rhythm and normal heart sounds.   Pulmonary/Chest: Effort normal and breath sounds normal. She has no wheezes.  Lymphadenopathy:    She has no cervical adenopathy.  Neurological: She is alert and oriented to person, place, and time.  Psychiatric: She has a normal mood and affect. Her behavior is normal. Thought content normal.    ASSESSMENT/PLAN:  Acute maxillary sinusitis, recurrence not specified  Vertigo    Patient Instructions  Take meclizine as needed dizziness Finish levaquin Push fluids Take a mucinex WITH decongestant for a couple of days Use a saline nasal wash Call if not better in a couple of days   Raylene Everts, MD

## 2016-10-19 ENCOUNTER — Telehealth: Payer: Self-pay | Admitting: Family Medicine

## 2016-10-19 ENCOUNTER — Other Ambulatory Visit: Payer: Self-pay | Admitting: Family Medicine

## 2016-10-19 ENCOUNTER — Ambulatory Visit (HOSPITAL_COMMUNITY)
Admission: RE | Admit: 2016-10-19 | Discharge: 2016-10-19 | Disposition: A | Discharge status: Home / Self Care | Payer: 59 | Source: Ambulatory Visit | Attending: Family Medicine | Admitting: Family Medicine

## 2016-10-19 ENCOUNTER — Encounter: Payer: Self-pay | Admitting: Family Medicine

## 2016-10-19 DIAGNOSIS — J0100 Acute maxillary sinusitis, unspecified: Secondary | ICD-10-CM | POA: Diagnosis not present

## 2016-10-19 MED ORDER — AZELASTINE HCL 0.1 % NA SOLN: 2.0000 | Freq: Two times a day (BID) | NASAL | 12 refills | Status: DC

## 2016-10-19 NOTE — Telephone Encounter (Signed)
Christina [name] is stating that the over the counter medication that she is taking for vertigo is not helping at all, please advise her what to do

## 2016-10-19 NOTE — Telephone Encounter (Signed)
Spoke directly with pt reports fullness in head and pressure, denies spinning sensation. [place] twice this past Frdiay and Saturday , at one tim her blood sugar was 50, not eating regularly, notes good fastings of 125. Needs X ray sinus, needs steroid nasal spray. Needs to start netty pot daily  Work note to return on 3/21 start today, she is aware

## 2016-10-19 NOTE — Telephone Encounter (Signed)
Spoke directly with pt, body of conversation documented

## 2016-10-20 ENCOUNTER — Encounter: Payer: Self-pay | Admitting: Family Medicine

## 2016-11-07 ENCOUNTER — Other Ambulatory Visit: Payer: Self-pay | Admitting: Family Medicine

## 2016-11-17 ENCOUNTER — Ambulatory Visit (HOSPITAL_COMMUNITY): Payer: Self-pay | Admitting: Psychiatry

## 2016-12-01 ENCOUNTER — Encounter (HOSPITAL_BASED_OUTPATIENT_CLINIC_OR_DEPARTMENT_OTHER): Payer: Self-pay | Admitting: *Deleted

## 2016-12-02 ENCOUNTER — Other Ambulatory Visit: Payer: Self-pay | Admitting: Obstetrics and Gynecology

## 2016-12-03 ENCOUNTER — Encounter (HOSPITAL_COMMUNITY): Payer: Self-pay | Admitting: *Deleted

## 2016-12-03 ENCOUNTER — Ambulatory Visit (HOSPITAL_COMMUNITY): Payer: Self-pay | Admitting: Psychiatry

## 2016-12-03 NOTE — Progress Notes (Signed)
Pt instructed npo pmn 5/9 x losartan w sip of water.  To [facility] 5/19 @ 0945.  Needs cbg , urine hcg on arrival.  Pt to come for cbc, bmet tomorrow.

## 2016-12-04 ENCOUNTER — Other Ambulatory Visit (HOSPITAL_COMMUNITY)
Admission: RE | Admit: 2016-12-04 | Discharge: 2016-12-04 | Disposition: A | Discharge status: Home / Self Care | Payer: 59 | Source: Ambulatory Visit | Attending: Obstetrics and Gynecology | Admitting: Obstetrics and Gynecology

## 2016-12-04 ENCOUNTER — Other Ambulatory Visit: Payer: Self-pay | Admitting: Family Medicine

## 2016-12-04 DIAGNOSIS — E1169 Type 2 diabetes mellitus with other specified complication: Secondary | ICD-10-CM | POA: Diagnosis not present

## 2016-12-04 DIAGNOSIS — E669 Obesity, unspecified: Secondary | ICD-10-CM | POA: Diagnosis not present

## 2016-12-04 LAB — BASIC METABOLIC PANEL
Anion gap: 10 (ref 5–15)
BUN: 12 mg/dL (ref 6–20)
CO2: 25 mmol/L (ref 22–32)
Calcium: 9.5 mg/dL (ref 8.9–10.3)
Chloride: 101 mmol/L (ref 101–111)
Creatinine, Ser: 0.89 mg/dL (ref 0.44–1.00)
GFR calc Af Amer: >60 mL/min (ref >60)
GFR calc non Af Amer: >60 mL/min (ref >60)
Glucose, Bld: 193 mg/dL — ABNORMAL HIGH (ref 65–99)
Potassium: 3.2 mmol/L — ABNORMAL LOW (ref 3.5–5.1)
Sodium: 136 mmol/L (ref 135–145)

## 2016-12-04 LAB — CBC
HCT: 40.3 % (ref 36.0–46.0)
Hemoglobin: 12.9 g/dL (ref 12.0–15.0)
MCH: 27.4 pg (ref 26.0–34.0)
MCHC: 32 g/dL (ref 30.0–36.0)
MCV: 85.7 fL (ref 78.0–100.0)
Platelets: 391 10*3/uL (ref 150–400)
RBC: 4.7 MIL/uL (ref 3.87–5.11)
RDW: 15.5 % (ref 11.5–15.5)
WBC: 5 10*3/uL (ref 4.0–10.5)

## 2016-12-09 ENCOUNTER — Encounter: Payer: Self-pay | Admitting: Family Medicine

## 2016-12-09 NOTE — H&P (Signed)
Admission History and Physical Exam for a Gynecology Patient  [name] is a 41 y.o. female, [phone], who presents for hysteroscopy, D&C, and NovaSure ablation of the endometrium. She C/O menorrhagia. An ultrasound showed adenomyosis. The patient is status post tubal sterilization. She has been followed at the [facility] and Gynecology division of [facility].  OB History    Gravida Para Term Preterm AB Living   4 3 2 1   3    SAB TAB Ectopic Multiple Live Births         0 3      Past Medical History:  Diagnosis Date  . Anemia   . Bipolar disorder ([place])   . Diabetes mellitus    type 2 DM, insulin only needed during pregnancy  . Gastroparesis   . 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 GERD (gastroesophageal reflux disease)   . H/O acute sinusitis 10/2016  . Hypertension   . Thyroid enlargement   . Wears glasses     No prescriptions prior to admission.    Past Surgical History:  Procedure Laterality Date  . BREAST REDUCTION SURGERY  1994  . CESAREAN SECTION N/A 03/06/2016   Procedure: CESAREAN SECTION;  Surgeon: Ena Dawley, MD;  Location: [facility];  Service: Obstetrics;  Laterality: N/A;  . dermoid tumor  2000  . ESOPHAGOGASTRODUODENOSCOPY  07/01/2009   QZE:SPQZRA esphagus without barrett's/dilation with 16 mm/mild erthyema in the antrum. mild chronic gastritis on path.   Theodoro Kalata FINGER RELEASE  Nov 2016  . TUBAL LIGATION Bilateral 03/06/2016   Procedure: BILATERAL TUBAL LIGATION;  Surgeon: Ena Dawley, MD;  Location: [place];  Service: Obstetrics;  Laterality: Bilateral;    Allergies  Allergen Reactions  . Other Anaphylaxis    Tree nuts  . Peanut Oil Anaphylaxis  . Peanut-Containing Drug Products Anaphylaxis  . Ace Inhibitors Cough    Pt on no contaception and is at reproductive age, discussed with nephrology also  . Invokana [Canagliflozin] Other (See Comments)     Worried about ketoacidosis   . Augmentin [Amoxicillin-Pot Clavulanate] Nausea And Vomiting    Causes stomach cramps. Patient can take amoxicillin, not Augmentin.    Family History: family history includes Asthma in her father; Diabetes in her father and mother; Fibromyalgia in her mother; Heart disease in her father; Hypertension in her father and mother.  Social History:  reports that she has never smoked. She has never used smokeless tobacco. She reports that she does not drink alcohol or use drugs.  Review of systems: See HPI.  Admission Physical Exam:    Body mass index is 39.55 kg/m.  Height 5' 4.5" (1.638 m), weight 234 lb (106.1 kg), last menstrual period 12/02/2016, unknown if currently breastfeeding.  HEENT:                 Within normal limits Chest:                   Clear Heart:                    Regular rate and rhythm Breasts:                No masses, skin changes, bleeding, or discharge present Abdomen:             Nontender, no masses Extremities:          Grossly normal Neurologic exam: Grossly normal  Pelvic  exam:  External genitalia: normal general appearance Vaginal: normal without tenderness, induration or masses Cervix: normal appearance Adnexa: normal bimanual exam Uterus: upper limits normal size  Assessment:  Menorrhagia  Adenomyosis  BMI equals 39.6  Plan:  The patient will have hysteroscopy, D&C, and NovaSure ablation of the endometrium. She understands the indication for her surgical procedure.  She accepts the risk of, but not limited to, anesthetic complications, bleeding, infection, and possible damage to the surrounding organs.   Eli Hose 12/09/2016

## 2016-12-10 ENCOUNTER — Ambulatory Visit (HOSPITAL_BASED_OUTPATIENT_CLINIC_OR_DEPARTMENT_OTHER): Payer: 59 | Admitting: Anesthesiology

## 2016-12-10 ENCOUNTER — Encounter (HOSPITAL_BASED_OUTPATIENT_CLINIC_OR_DEPARTMENT_OTHER)
Admission: RE | Disposition: A | Discharge status: Home / Self Care | Payer: Self-pay | Source: Ambulatory Visit | Attending: Obstetrics and Gynecology

## 2016-12-10 ENCOUNTER — Ambulatory Visit (HOSPITAL_BASED_OUTPATIENT_CLINIC_OR_DEPARTMENT_OTHER)
Admission: RE | Admit: 2016-12-10 | Discharge: 2016-12-10 | Disposition: A | Discharge status: Home / Self Care | Payer: 59 | Source: Ambulatory Visit | Attending: Obstetrics and Gynecology | Admitting: Obstetrics and Gynecology

## 2016-12-10 ENCOUNTER — Encounter (HOSPITAL_BASED_OUTPATIENT_CLINIC_OR_DEPARTMENT_OTHER): Payer: Self-pay | Admitting: Anesthesiology

## 2016-12-10 DIAGNOSIS — E010 Iodine-deficiency related diffuse (endemic) goiter: Secondary | ICD-10-CM | POA: Diagnosis not present

## 2016-12-10 DIAGNOSIS — Z881 Allergy status to other antibiotic agents status: Secondary | ICD-10-CM | POA: Insufficient documentation

## 2016-12-10 DIAGNOSIS — E669 Obesity, unspecified: Secondary | ICD-10-CM | POA: Diagnosis not present

## 2016-12-10 DIAGNOSIS — I10 Essential (primary) hypertension: Secondary | ICD-10-CM | POA: Diagnosis not present

## 2016-12-10 DIAGNOSIS — K3184 Gastroparesis: Secondary | ICD-10-CM | POA: Insufficient documentation

## 2016-12-10 DIAGNOSIS — Z8249 Family history of ischemic heart disease and other diseases of the circulatory system: Secondary | ICD-10-CM | POA: Insufficient documentation

## 2016-12-10 DIAGNOSIS — Z91010 Allergy to peanuts: Secondary | ICD-10-CM | POA: Diagnosis not present

## 2016-12-10 DIAGNOSIS — Z6839 Body mass index (BMI) 39.0-39.9, adult: Secondary | ICD-10-CM | POA: Insufficient documentation

## 2016-12-10 DIAGNOSIS — Z825 Family history of asthma and other chronic lower respiratory diseases: Secondary | ICD-10-CM | POA: Diagnosis not present

## 2016-12-10 DIAGNOSIS — Z833 Family history of diabetes mellitus: Secondary | ICD-10-CM | POA: Diagnosis not present

## 2016-12-10 DIAGNOSIS — Z888 Allergy status to other drugs, medicaments and biological substances status: Secondary | ICD-10-CM | POA: Diagnosis not present

## 2016-12-10 DIAGNOSIS — E119 Type 2 diabetes mellitus without complications: Secondary | ICD-10-CM | POA: Diagnosis not present

## 2016-12-10 DIAGNOSIS — N920 Excessive and frequent menstruation with regular cycle: Secondary | ICD-10-CM | POA: Diagnosis present

## 2016-12-10 DIAGNOSIS — N800 Endometriosis of uterus: Secondary | ICD-10-CM | POA: Diagnosis not present

## 2016-12-10 DIAGNOSIS — K219 Gastro-esophageal reflux disease without esophagitis: Secondary | ICD-10-CM | POA: Insufficient documentation

## 2016-12-10 HISTORY — DX: Personal history of other diseases of the respiratory system: Z87.09

## 2016-12-10 HISTORY — DX: Nontoxic goiter, unspecified: E04.9

## 2016-12-10 HISTORY — DX: Major depressive disorder, single episode, unspecified: F32.9

## 2016-12-10 HISTORY — DX: Depression, unspecified: F32.A

## 2016-12-10 HISTORY — PX: DILITATION & CURRETTAGE/HYSTROSCOPY WITH NOVASURE ABLATION: SHX5568

## 2016-12-10 HISTORY — DX: Presence of spectacles and contact lenses: Z97.3

## 2016-12-10 LAB — GLUCOSE, CAPILLARY
Glucose-Capillary: 125 mg/dL — ABNORMAL HIGH (ref 65–99)
Glucose-Capillary: 115 mg/dL — ABNORMAL HIGH (ref 65–99)

## 2016-12-10 LAB — POCT PREGNANCY, URINE: Preg Test, Ur: NEGATIVE

## 2016-12-10 SURGERY — DILATATION & CURETTAGE/HYSTEROSCOPY WITH NOVASURE ABLATION
Anesthesia: General | Site: Vagina

## 2016-12-10 MED ORDER — MIDAZOLAM HCL 2 MG/2ML IJ SOLN
INTRAMUSCULAR | Status: AC
  Filled 2016-12-10: qty 2

## 2016-12-10 MED ORDER — FENTANYL CITRATE (PF) 100 MCG/2ML IJ SOLN
INTRAMUSCULAR | Status: DC | PRN
  Administered 2016-12-10 (×2): 50 ug via INTRAVENOUS

## 2016-12-10 MED ORDER — IBUPROFEN 800 MG PO TABS: 800.0000 mg | ORAL_TABLET | Freq: Three times a day (TID) | ORAL | 1 refills | Status: DC | PRN

## 2016-12-10 MED ORDER — LACTATED RINGERS IV SOLN
INTRAVENOUS | Status: DC
  Administered 2016-12-10 (×2): via INTRAVENOUS
  Filled 2016-12-10: qty 1000

## 2016-12-10 MED ORDER — OXYCODONE-ACETAMINOPHEN 5-325 MG PO TABS: 1.0000 | ORAL_TABLET | ORAL | 0 refills | Status: DC | PRN

## 2016-12-10 MED ORDER — PROPOFOL 10 MG/ML IV BOLUS
INTRAVENOUS | Status: AC
  Filled 2016-12-10: qty 20

## 2016-12-10 MED ORDER — KETOROLAC TROMETHAMINE 30 MG/ML IJ SOLN
INTRAMUSCULAR | Status: DC | PRN
  Administered 2016-12-10 (×2): 30 mg via INTRAVENOUS

## 2016-12-10 MED ORDER — FENTANYL CITRATE (PF) 100 MCG/2ML IJ SOLN
25.0000 ug | INTRAMUSCULAR | Status: DC | PRN
  Administered 2016-12-10: 50 ug via INTRAVENOUS
  Filled 2016-12-10: qty 1

## 2016-12-10 MED ORDER — OXYCODONE-ACETAMINOPHEN 5-325 MG PO TABS
ORAL_TABLET | ORAL | Status: AC
  Filled 2016-12-10: qty 1

## 2016-12-10 MED ORDER — PROMETHAZINE HCL 25 MG/ML IJ SOLN
6.2500 mg | INTRAMUSCULAR | Status: DC | PRN
  Filled 2016-12-10: qty 1

## 2016-12-10 MED ORDER — FENTANYL CITRATE (PF) 100 MCG/2ML IJ SOLN
INTRAMUSCULAR | Status: AC
  Filled 2016-12-10: qty 2

## 2016-12-10 MED ORDER — MEPERIDINE HCL 25 MG/ML IJ SOLN
6.2500 mg | INTRAMUSCULAR | Status: DC | PRN
  Filled 2016-12-10: qty 1

## 2016-12-10 MED ORDER — DEXAMETHASONE SODIUM PHOSPHATE 10 MG/ML IJ SOLN
INTRAMUSCULAR | Status: AC
  Filled 2016-12-10: qty 1

## 2016-12-10 MED ORDER — OXYCODONE-ACETAMINOPHEN 5-325 MG PO TABS
1.0000 | ORAL_TABLET | ORAL | Status: DC | PRN
  Administered 2016-12-10: 1 via ORAL
  Filled 2016-12-10: qty 1

## 2016-12-10 MED ORDER — PROPOFOL 10 MG/ML IV BOLUS
INTRAVENOUS | Status: DC | PRN
  Administered 2016-12-10: 200 mg via INTRAVENOUS

## 2016-12-10 MED ORDER — ONDANSETRON HCL 4 MG/2ML IJ SOLN
INTRAMUSCULAR | Status: DC | PRN
  Administered 2016-12-10: 4 mg via INTRAVENOUS

## 2016-12-10 MED ORDER — BUPIVACAINE HCL (PF) 0.5 % IJ SOLN
INTRAMUSCULAR | Status: DC | PRN
  Administered 2016-12-10: 10 mL

## 2016-12-10 MED ORDER — MIDAZOLAM HCL 2 MG/2ML IJ SOLN
0.5000 mg | Freq: Once | INTRAMUSCULAR | Status: DC | PRN
  Filled 2016-12-10: qty 2

## 2016-12-10 MED ORDER — MIDAZOLAM HCL 5 MG/5ML IJ SOLN
INTRAMUSCULAR | Status: DC | PRN
  Administered 2016-12-10: 2 mg via INTRAVENOUS

## 2016-12-10 MED ORDER — ONDANSETRON HCL 4 MG/2ML IJ SOLN
INTRAMUSCULAR | Status: AC
  Filled 2016-12-10: qty 2

## 2016-12-10 MED ORDER — DEXAMETHASONE SODIUM PHOSPHATE 4 MG/ML IJ SOLN
INTRAMUSCULAR | Status: DC | PRN
  Administered 2016-12-10: 10 mg via INTRAVENOUS

## 2016-12-10 MED ORDER — ONDANSETRON HCL 4 MG PO TABS: 4.0000 mg | ORAL_TABLET | Freq: Three times a day (TID) | ORAL | 0 refills | Status: DC | PRN

## 2016-12-10 MED ORDER — LIDOCAINE 2% (20 MG/ML) 5 ML SYRINGE
INTRAMUSCULAR | Status: DC | PRN
  Administered 2016-12-10: 40 mg via INTRAVENOUS

## 2016-12-10 SURGICAL SUPPLY — 31 items
ABLATOR ENDOMETRIAL BIPOLAR (ABLATOR) ×2 IMPLANT
BIPOLAR CUTTING LOOP 21FR (ELECTRODE)
CANISTER SUCT 3000ML PPV (MISCELLANEOUS) ×2 IMPLANT
CATH ROBINSON RED A/P 16FR (CATHETERS) ×2 IMPLANT
COVER BACK TABLE 60X90IN (DRAPES) ×2 IMPLANT
DILATOR CANAL MILEX (MISCELLANEOUS) IMPLANT
DRAPE HYSTEROSCOPY (DRAPE) ×2 IMPLANT
DRAPE LG THREE QUARTER DISP (DRAPES) ×2 IMPLANT
DRSG TELFA 3X8 NADH (GAUZE/BANDAGES/DRESSINGS) ×2 IMPLANT
ELECT REM PT RETURN 9FT ADLT (ELECTROSURGICAL)
ELECTRODE REM PT RTRN 9FT ADLT (ELECTROSURGICAL) IMPLANT
GAUZE VASELINE 1X8 (GAUZE/BANDAGES/DRESSINGS) IMPLANT
GLOVE BIOGEL PI IND STRL 8.5 (GLOVE) ×1 IMPLANT
GLOVE BIOGEL PI INDICATOR 8.5 (GLOVE) ×1
GLOVE ECLIPSE 8.0 STRL XLNG CF (GLOVE) ×4 IMPLANT
GOWN STRL REUS W/ TWL XL LVL3 (GOWN DISPOSABLE) ×2 IMPLANT
GOWN STRL REUS W/TWL XL LVL3 (GOWN DISPOSABLE) ×2
IV LACTATED RINGER IRRG 3000ML (IV SOLUTION) ×1
IV LR IRRIG 3000ML ARTHROMATIC (IV SOLUTION) ×1 IMPLANT
KIT RM TURNOVER CYSTO AR (KITS) ×2 IMPLANT
LEGGING LITHOTOMY PAIR STRL (DRAPES) ×2 IMPLANT
LOOP CUTTING BIPOLAR 21FR (ELECTRODE) IMPLANT
MANIFOLD NEPTUNE II (INSTRUMENTS) IMPLANT
PACK BASIN DAY SURGERY FS (CUSTOM PROCEDURE TRAY) ×2 IMPLANT
PAD OB MATERNITY 4.3X12.25 (PERSONAL CARE ITEMS) ×2 IMPLANT
TOWEL OR 17X24 6PK STRL BLUE (TOWEL DISPOSABLE) ×4 IMPLANT
TRAY DSU PREP LF (CUSTOM PROCEDURE TRAY) ×2 IMPLANT
TUBE CONNECTING 12X1/4 (SUCTIONS) IMPLANT
TUBING AQUILEX INFLOW (TUBING) ×2 IMPLANT
TUBING AQUILEX OUTFLOW (TUBING) ×2 IMPLANT
WATER STERILE IRR 500ML POUR (IV SOLUTION) ×2 IMPLANT

## 2016-12-10 NOTE — Op Note (Signed)
[facility] Operative Procedure   NOVASURE ABLATION OF THE ENDOMETRIUM   PRESCIOUS HURLESS  DOB:    1975/10/28  MRN:    [id number]  CSN:    [id number]  Date of Surgery:  12/10/2016  Preoperative Diagnosis:  Menorrhagia  Adenomyosis  Obesity  Diabetes  Postoperative Diagnosis:  Menorrhagia  Adenomyosis  Obesity  Diabetes  Procedure:  Hysteroscopy   Dilation and curettage   NovaSure Ablation of the Endometrium  Surgeon:  Gildardo Cranker, M.D.  Assistant:  none  Anesthetic:  Gen.  Disposition:  [name] is a 41 y.o. female with the above mentioned diagnosis. She understands the indications for her surgical procedure as well as the alternative treatment options.  Her endometrial biopsy showed benign cells.  She understands that she should not become pregnant.  Her contraceptive method is: Tubal sterilization.  She understands the potential benefits and potential risks associated with NovaSure ablation.  Her questions were answered.  She accepts those risks.  A permit has been signed.  Findings:  The uterus is approximately 10 weeks size.  No adnexal masses are appreciated.  The cervical length is 3.5 centimeters. The uterus sounded to 9 centimeters. The cavity length is 5.5 centimeters. The cavity width is 3.5 centimeters.  The urine pregnancy test is negative.  Procedure:  The patient was taken to the operating room where a general anesthetic was given. The lower abdomen, perineum, and pelvis were were prepped. The bladder was drained of urine. The patient was sterilely draped.The patient was placed in a lithotomy position.  A pelvic exam and speculum exam were performed.  A single-tooth tenaculum was placed on the cervix. A paracervical block was placed using 10 cc of half percent Marcaine. An endocervical curettage was performed. The cervix was sounded.  The uterus was sounded. The cervix was gently dilated.  The  diagnostic hysteroscope was inserted. The endometrial cavity was thought to be normal. Tubal ostia were identified. The hysteroscope was removed. The cavity was then curetted until was felt to be clean. The NovaSure instrument was tested and was felt to be in proper operating condition.  The cavity length was set.  The NovaSure instrument was introduced into the uterus.  The cavity width was determined.  A test procedure was performed and the instrument passed.  The cavity was then ablated for 105 seconds.  The patient tolerated her procedure well.  All instrument were removed.  The uterus was reexamined and was felt to be firm.  The patient was returned to the supine position. Sponge and needle counts were correct.  The endocervical curettings and endometrial curettings were sent to pathology. The patient was returned to the supine position. Her anesthetic was reversed. The patient was taken to the recovery room in stable condition.  Followup instructions:  Instructions were reviewed.  The patient will call for pain or fever or any problem that she is concerned about.  She was told to expect a vaginal discharge for 10 days to 3 weeks.  She was asked to refrain from intercourse until all of the discharge has resolved.  Followup visit:  2 weeks  Discharge medications:  Motrin 800 mg every 8 hours as needed for pain. Percocet 1 or 2 tablets every 4 hours as needed for pain. Zofran 4 mg every 4 hours as needed for nausea.  Gildardo Cranker, M.D.  12/10/2016 12:26 PM

## 2016-12-10 NOTE — Anesthesia Postprocedure Evaluation (Signed)
Anesthesia Post Note  Patient: Christina [name]  Procedure(s) Performed: Procedure(s) (LRB): DILATATION & CURETTAGE/HYSTEROSCOPY WITH NOVASURE ABLATION (N/A)  Patient location during evaluation: PACU Anesthesia Type: General Level of consciousness: awake and alert Pain management: pain level controlled Vital Signs Assessment: post-procedure vital signs reviewed and stable Respiratory status: spontaneous breathing, nonlabored ventilation, respiratory function stable and patient connected to nasal cannula oxygen Cardiovascular status: blood pressure returned to baseline and stable Postop Assessment: no signs of nausea or vomiting Anesthetic complications: no       Last Vitals:  Vitals:   12/10/16 1223 12/10/16 1230  BP: (!) 130/93 (!) 126/91  Pulse: 96 87  Resp: 12 17  Temp: 36.7 C     Last Pain:  Vitals:   12/10/16 0956  TempSrc: Oral                 Iziah Cates S

## 2016-12-10 NOTE — Progress Notes (Signed)
The patient was interviewed and examined today.  The previously documented history and physical examination was reviewed. There are no changes. The operative procedure was reviewed. The risks and benefits were outlined again. The specific risks include, but are not limited to, anesthetic complications, bleeding, infections, and possible damage to the surrounding organs. The patient's questions were answered.  We are ready to proceed as outlined. The likelihood of the patient achieving the goals of this procedure is very likely.   BP (!) 132/91 (BP Location: Left Arm, Patient Position: Sitting)   Pulse (!) 105   Temp 98.6 F (37 C) (Oral)   Resp 18   Ht 5' 4.5" (1.638 m)   Wt 230 lb 12.8 oz (104.7 kg)   LMP 12/02/2016 (Exact Date)   SpO2 98%   BMI 39.00 kg/m    Gildardo Cranker, M.D.

## 2016-12-10 NOTE — Anesthesia Procedure Notes (Signed)
Procedure Name: LMA Insertion Date/Time: 12/10/2016 11:38 AM Performed by: Wanita Chamberlain Pre-anesthesia Checklist: Patient identified, Timeout performed, Emergency Drugs available, Suction available and Patient being monitored Patient Re-evaluated:Patient Re-evaluated prior to inductionOxygen Delivery Method: Circle system utilized Preoxygenation: Pre-oxygenation with 100% oxygen Intubation Type: IV induction Ventilation: Mask ventilation without difficulty LMA: LMA inserted LMA Size: 4.0 Laser Tube: Cuffed inflated with minimal occlusive pressure - saline Number of attempts: 1 Placement Confirmation: breath sounds checked- equal and bilateral and positive ETCO2 Tube secured with: Tape Dental Injury: Teeth and Oropharynx as per pre-operative assessment

## 2016-12-10 NOTE — Transfer of Care (Signed)
Immediate Anesthesia Transfer of Care Note  Patient: Kemari A Nery  Procedure(s) Performed: Procedure(s): DILATATION & CURETTAGE/HYSTEROSCOPY WITH NOVASURE ABLATION (N/A)  Patient Location: PACU  Anesthesia Type:General  Level of Consciousness: awake, alert , oriented and patient cooperative  Airway & Oxygen Therapy: Patient Spontanous Breathing and Patient connected to nasal cannula oxygen  Post-op Assessment: Report given to RN and Post -op Vital signs reviewed and stable  Post vital signs: Reviewed and stable  Last Vitals:  Vitals:   12/10/16 0956  BP: (!) 132/91  Pulse: (!) 105  Resp: 18  Temp: 37 C    Last Pain:  Vitals:   12/10/16 0956  TempSrc: Oral      Patients Stated Pain Goal: 7 (70/92/95 [id number])  Complications: No apparent anesthesia complications

## 2016-12-10 NOTE — Anesthesia Preprocedure Evaluation (Addendum)
Anesthesia Evaluation  Patient identified by MRN, date of birth, ID band Patient awake    Reviewed: Allergy & Precautions, NPO status , Patient's Chart, lab work & pertinent test results  History of Anesthesia Complications Negative for: history of anesthetic complications  Airway Mallampati: II  TM Distance: >3 FB Neck ROM: Full    Dental  (+) Missing, Dental Advisory Given, Teeth Intact   Pulmonary neg pulmonary ROS,    breath sounds clear to auscultation       Cardiovascular hypertension, Pt. on medications  Rhythm:Regular Rate:Normal     Neuro/Psych Bipolar Disorder    GI/Hepatic Neg liver ROS, GERD  Medicated and Controlled,  Endo/Other  diabetes (glu 125), Type 2, Oral Hypoglycemic AgentsMorbid obesity  Renal/GU negative Renal ROS     Musculoskeletal   Abdominal (+) + obese,   Peds  Hematology negative hematology ROS (+) anemia ,   Anesthesia Other Findings   Reproductive/Obstetrics                          Anesthesia Physical Anesthesia Plan  ASA: III  Anesthesia Plan: General   Post-op Pain Management:    Induction: Intravenous  Airway Management Planned: LMA  Additional Equipment:   Intra-op Plan:   Post-operative Plan:   Informed Consent: I have reviewed the patients History and Physical, chart, labs and discussed the procedure including the risks, benefits and alternatives for the proposed anesthesia with the patient or authorized representative who has indicated his/her understanding and acceptance.   Dental advisory given  Plan Discussed with: CRNA and Surgeon  Anesthesia Plan Comments: (Plan routine monitors, GA- LMA OK)        Anesthesia Quick Evaluation

## 2016-12-10 NOTE — Discharge Instructions (Signed)
°Post Anesthesia Home Care Instructions ° °Activity: °Get plenty of rest for the remainder of the day. A responsible individual must stay with you for 24 hours following the procedure.  °For the next 24 hours, DO NOT: °-Drive a car °-Operate machinery °-Drink alcoholic beverages °-Take any medication unless instructed by your physician °-Make any legal decisions or sign important papers. ° °Meals: °Start with liquid foods such as gelatin or soup. Progress to regular foods as tolerated. Avoid greasy, spicy, heavy foods. If nausea and/or vomiting occur, drink only clear liquids until the nausea and/or vomiting subsides. Call your physician if vomiting continues. ° °Special Instructions/Symptoms: °Your throat may feel dry or sore from the anesthesia or the breathing tube placed in your throat during surgery. If this causes discomfort, gargle with warm salt water. The discomfort should disappear within 24 hours. ° °If you had a scopolamine patch placed behind your ear for the management of post- operative nausea and/or vomiting: ° °1. The medication in the patch is effective for 72 hours, after which it should be removed.  Wrap patch in a tissue and discard in the trash. Wash hands thoroughly with soap and water. °2. You may remove the patch earlier than 72 hours if you experience unpleasant side effects which may include dry mouth, dizziness or visual disturbances. °3. Avoid touching the patch. Wash your hands with soap and water after contact with the patch. °  °Hysteroscopy, Care After °Refer to this sheet in the next few weeks. These instructions provide you with information on caring for yourself after your procedure. Your health care provider may also give you more specific instructions. Your treatment has been planned according to current medical practices, but problems sometimes occur. Call your health care provider if you have any problems or questions after your procedure. °What can I expect after the  procedure? °After your procedure, it is typical to have the following: °· You may have some cramping. This normally lasts for a couple days. °· You may have bleeding. This can vary from light spotting for a few days to menstrual-like bleeding for 3-7 days. ° °Follow these instructions at home: °· Rest for the first 1-2 days after the procedure. °· Only take over-the-counter or prescription medicines as directed by your health care provider. Do not take aspirin. It can increase the chances of bleeding. °· Take showers instead of baths for 2 weeks or as directed by your health care provider. °· Do not drive for 24 hours or as directed. °· Do not drink alcohol while taking pain medicine. °· Do not use tampons, douche, or have sexual intercourse for 2 weeks or until your health care provider says it is okay. °· Take your temperature twice a day for 4-5 days. Write it down each time. °· Follow your health care provider's advice about diet, exercise, and lifting. °· If you develop constipation, you may: °? Take a mild laxative if your health care provider approves. °? Add bran foods to your diet. °? Drink enough fluids to keep your urine clear or pale yellow. °· Try to have someone with you or available to you for the first 24-48 hours, especially if you were given a general anesthetic. °· Follow up with your health care provider as directed. °Contact a health care provider if: °· You feel dizzy or lightheaded. °· You feel sick to your stomach (nauseous). °· You have abnormal vaginal discharge. °· You have a rash. °· You have pain that is not controlled with medicine. °  Get help right away if: °· You have bleeding that is heavier than a normal menstrual period. °· You have a fever. °· You have increasing cramps or pain, not controlled with medicine. °· You have new belly (abdominal) pain. °· You pass out. °· You have pain in the tops of your shoulders (shoulder strap areas). °· You have shortness of breath. °This  information is not intended to replace advice given to you by your health care provider. Make sure you discuss any questions you have with your health care provider. °Document Released: 05/10/2013 Document Revised: 12/26/2015 Document Reviewed: 02/16/2013 °Elsevier Interactive Patient Education © 2017 [employer] ° °

## 2016-12-14 ENCOUNTER — Encounter (HOSPITAL_BASED_OUTPATIENT_CLINIC_OR_DEPARTMENT_OTHER): Payer: Self-pay | Admitting: Obstetrics and Gynecology

## 2016-12-14 LAB — CBC
Hematocrit: 41.2 % (ref 34.0–46.6)
Hemoglobin: 13.1 g/dL (ref 11.1–15.9)
MCH: 27.1 pg (ref 26.6–33.0)
MCHC: 31.8 g/dL (ref 31.5–35.7)
MCV: 85 fL (ref 79–97)
Platelets: 359 10*3/uL (ref 150–379)
RBC: 4.83 x10E6/uL (ref 3.77–5.28)
RDW: 16.3 % — ABNORMAL HIGH (ref 12.3–15.4)
WBC: 5.8 10*3/uL (ref 3.4–10.8)

## 2016-12-14 LAB — CMP14+EGFR
ALT: 12 IU/L (ref 0–32)
AST: 9 IU/L (ref 0–40)
Albumin/Globulin Ratio: 1.7 (ref 1.2–2.2)
Albumin: 4.2 g/dL (ref 3.5–5.5)
Alkaline Phosphatase: 62 IU/L (ref 39–117)
BUN/Creatinine Ratio: 11 (ref 9–23)
BUN: 10 mg/dL (ref 6–24)
Bilirubin Total: 0.2 mg/dL (ref 0.0–1.2)
CO2: 24 mmol/L (ref 18–29)
Calcium: 9.2 mg/dL (ref 8.7–10.2)
Chloride: 102 mmol/L (ref 96–106)
Creatinine, Ser: 0.93 mg/dL (ref 0.57–1.00)
GFR calc Af Amer: 88 mL/min/{1.73_m2} (ref >59)
GFR calc non Af Amer: 77 mL/min/{1.73_m2} (ref >59)
Globulin, Total: 2.5 g/dL (ref 1.5–4.5)
Glucose: 176 mg/dL — ABNORMAL HIGH (ref 65–99)
Potassium: 4.3 mmol/L (ref 3.5–5.2)
Sodium: 142 mmol/L (ref 134–144)
Total Protein: 6.7 g/dL (ref 6.0–8.5)

## 2016-12-14 LAB — HEMOGLOBIN A1C
Est. average glucose Bld gHb Est-mCnc: 189 mg/dL
Hgb A1c MFr Bld: 8.2 % — ABNORMAL HIGH (ref 4.8–5.6)

## 2016-12-14 LAB — IRON: Iron: 47 ug/dL (ref 27–159)

## 2016-12-14 LAB — FERRITIN: Ferritin: 13 ng/mL — ABNORMAL LOW (ref 15–150)

## 2016-12-16 ENCOUNTER — Ambulatory Visit (INDEPENDENT_AMBULATORY_CARE_PROVIDER_SITE_OTHER): Payer: 59 | Admitting: Family Medicine

## 2016-12-16 ENCOUNTER — Encounter: Payer: Self-pay | Admitting: Family Medicine

## 2016-12-16 VITALS — BP 108/80 | HR 83 | Resp 16 | Ht 65.0 in | Wt 232.0 lb

## 2016-12-16 DIAGNOSIS — E669 Obesity, unspecified: Secondary | ICD-10-CM | POA: Diagnosis not present

## 2016-12-16 DIAGNOSIS — E1169 Type 2 diabetes mellitus with other specified complication: Secondary | ICD-10-CM | POA: Diagnosis not present

## 2016-12-16 LAB — HM DIABETES EYE EXAM

## 2016-12-16 NOTE — Progress Notes (Signed)
Christina [name]     MRN: [id number]      DOB: January 22, 1976   HPI [name] is here for follow up and re-evaluation of chronic medical conditions, medication management and review of any available recent lab and radiology data.  Preventive health is updated, specifically  Cancer screening and Immunization.   Questions or concerns regarding consultations or procedures which the PT has had in the interim are  Addressed. Had reportedly normal eye exam earlier today and ablation and d/c on 12/10/2016 The PT denies any adverse reactions to medication. No exercise commitment yet, and blood sugars are remaining high, eating more ice cream than she should and eating is uncontrolled  ROS Denies recent fever or chills. Denies sinus pressure, nasal congestion, ear pain or sore throat. Denies chest congestion, productive cough or wheezing. Denies chest pains, palpitations and leg swelling Denies abdominal pain, nausea, vomiting,diarrhea or constipation.   Denies dysuria, frequency, hesitancy or incontinence. Denies joint pain, swelling and limitation in mobility. Denies headaches, seizures, numbness, or tingling. Denies depression, anxiety or insomnia. Denies skin break down or rash.   PE  BP 108/80   Pulse 83   Resp 16   Ht 5\' 5"  (1.651 m)   Wt 232 lb (105.2 kg)   LMP 12/02/2016 (Exact Date)   SpO2 97%   BMI 38.61 kg/m   Patient alert and oriented and in no cardiopulmonary distress.  HEENT: No facial asymmetry, EOMI,   oropharynx pink and moist.  Neck supple no JVD, no mass.  Chest: Clear to auscultation bilaterally.  CVS: S1, S2 no murmurs, no S3.Regular rate.  ABD: Soft non tender.   Ext: No edema  MS: Adequate ROM spine, shoulders, hips and knees.  Skin: Intact, no ulcerations or rash noted.  Psych: Good eye contact, normal affect. Memory intact not anxious or depressed appearing.  CNS: CN 2-12 intact, power,  normal throughout.no focal deficits noted.   Assessment &  Plan  Essential hypertension, benign Controlled, no change in medication DASH diet and commitment to daily physical activity for a minimum of 30 minutes discussed and encouraged, as a part of hypertension management. The importance of attaining a healthy weight is also discussed.  BP/Weight 12/16/2016 12/10/2016 10/16/2016 10/13/2016 09/15/2016 07/13/2016 75/64/3329  Systolic BP 518 841 660 630 160 109 323  Diastolic BP 80 85 80 88 84 84 88  Wt. (Lbs) 232 230.8 232.08 235 234 - -  BMI 38.61 39 38.62 39.11 38.94 - -  Some encounter information is confidential and restricted. Go to Review Flowsheets activity to see all data.       Diabetes mellitus type 2 in obese ([place]) Deteriorated and uncontrolled [name] is reminded of the importance of commitment to daily physical activity for 30 minutes or more, as able and the need to limit carbohydrate intake to 30 to 60 grams per meal to help with blood sugar control.   The need to take medication as prescribed, test blood sugar as directed, and to call between visits if there is a concern that blood sugar is uncontrolled is also discussed.   [name] is reminded of the importance of daily foot exam, annual eye examination, and good blood sugar, blood pressure and cholesterol control. No med change.  Pt to contact office in next 2 to 4 weeks before starting insulin  Diabetic Labs Latest Ref Rng & Units 12/12/2016 12/04/2016 09/10/2016 09/10/2016 06/12/2016  HbA1c 4.8 - 5.6 % 8.2(H) - - 8.0(H) 7.0(H)  Microalbumin Not  Estab. ug/mL - - - - -  Micro/Creat Ratio 0.0 - 30.0 mg/g creat - - - - -  Chol 125 - 200 mg/dL - - - - -  HDL >=46 mg/dL - - - - -  Calc LDL <130 mg/dL - - - - -  Triglycerides <150 mg/dL - - - - -  Creatinine 0.57 - 1.00 mg/dL 0.93 0.89 0.98 0.98 0.79   BP/Weight 12/16/2016 12/10/2016 10/16/2016 10/13/2016 09/15/2016 07/13/2016 70/35/0093  Systolic BP 818 299 371 696 789 381 017  Diastolic BP 80 85 80 88 84 84 88  Wt. (Lbs) 232 230.8  232.08 235 234 - -  BMI 38.61 39 38.62 39.11 38.94 - -  Some encounter information is confidential and restricted. Go to Review Flowsheets activity to see all data.   Foot/eye exam completion dates Latest Ref Rng & Units 03/16/2016 12/05/2015  Eye Exam No Retinopathy - No Retinopathy  Foot Form Completion - Done -        Morbid obesity Deteriorated. Patient re-educated about  the importance of commitment to a  minimum of 150 minutes of exercise per week.  The importance of healthy food choices with portion control discussed. Encouraged to start a food diary, count calories and to consider  joining a support group. Sample diet sheets offered. Goals set by the patient for the next several months.   Weight /BMI 12/16/2016 12/10/2016 10/16/2016  WEIGHT 232 lb 230 lb 12.8 oz 232 lb 1.3 oz  HEIGHT 5\' 5"  5' 4.5" 5\' 5"   BMI 38.61 kg/m2 39 kg/m2 38.62 kg/m2  Some encounter information is confidential and restricted. Go to Review Flowsheets activity to see all data.

## 2016-12-16 NOTE — Assessment & Plan Note (Signed)
Deteriorated. Patient re-educated about  the importance of commitment to a  minimum of 150 minutes of exercise per week.  The importance of healthy food choices with portion control discussed. Encouraged to start a food diary, count calories and to consider  joining a support group. Sample diet sheets offered. Goals set by the patient for the next several months.   Weight /BMI 12/16/2016 12/10/2016 10/16/2016  WEIGHT 232 lb 230 lb 12.8 oz 232 lb 1.3 oz  HEIGHT 5\' 5"  5' 4.5" 5\' 5"   BMI 38.61 kg/m2 39 kg/m2 38.62 kg/m2  Some encounter information is confidential and restricted. Go to Review Flowsheets activity to see all data.

## 2016-12-16 NOTE — Assessment & Plan Note (Signed)
Controlled, no change in medication DASH diet and commitment to daily physical activity for a minimum of 30 minutes discussed and encouraged, as a part of hypertension management. The importance of attaining a healthy weight is also discussed.  BP/Weight 12/16/2016 12/10/2016 10/16/2016 10/13/2016 09/15/2016 07/13/2016 80/63/8685  Systolic BP 488 301 415 973 312 508 719  Diastolic BP 80 85 80 88 84 84 88  Wt. (Lbs) 232 230.8 232.08 235 234 - -  BMI 38.61 39 38.62 39.11 38.94 - -  Some encounter information is confidential and restricted. Go to Review Flowsheets activity to see all data.

## 2016-12-16 NOTE — Patient Instructions (Addendum)
F/u in 3 months  HBa1C, chem 7 and EGFr non fast 1 week before visit  Send e message in 2 to 4 weeks re blood sugar values and need to contimue or change management  Goal for fasting blood sugar ranges from 80 to 120 and 2 hours after any meal or at bedtime should be between 130 to 170.  It is important that you exercise regularly at least 30 minutes 5 times a week. If you develop chest pain, have severe difficulty breathing, or feel very tired, stop exercising immediately and seek medical attention    Please work on good  health habits so that your health will improve. 1. Commitment to daily physical activity for 30 to 60  minutes, if you are able to do this.  2. Commitment to wise food choices. Aim for half of your  food intake to be vegetable and fruit, one quarter starchy foods, and one quarter protein. Try to eat on a regular schedule  3 meals per day, snacking between meals should be limited to vegetables or fruits or small portions of nuts. 64 ounces of water per day is generally recommended, unless you have specific health conditions, like heart failure or kidney failure where you will need to limit fluid intake.  3. Commitment to sufficient and a  good quality of physical and mental rest daily, generally between 6 to 8 hours per day.  WITH PERSISTANCE AND PERSEVERANCE, THE IMPOSSIBLE , BECOMES THE NORM!

## 2016-12-16 NOTE — Assessment & Plan Note (Signed)
Deteriorated and uncontrolled Christina [name] is reminded of the importance of commitment to daily physical activity for 30 minutes or more, as able and the need to limit carbohydrate intake to 30 to 60 grams per meal to help with blood sugar control.   The need to take medication as prescribed, test blood sugar as directed, and to call between visits if there is a concern that blood sugar is uncontrolled is also discussed.   Christina [name] is reminded of the importance of daily foot exam, annual eye examination, and good blood sugar, blood pressure and cholesterol control. No med change.  Pt to contact office in next 2 to 4 weeks before starting insulin  Diabetic Labs Latest Ref Rng & Units 12/12/2016 12/04/2016 09/10/2016 09/10/2016 06/12/2016  HbA1c 4.8 - 5.6 % 8.2(H) - - 8.0(H) 7.0(H)  Microalbumin Not Estab. ug/mL - - - - -  Micro/Creat Ratio 0.0 - 30.0 mg/g creat - - - - -  Chol 125 - 200 mg/dL - - - - -  HDL >=46 mg/dL - - - - -  Calc LDL <130 mg/dL - - - - -  Triglycerides <150 mg/dL - - - - -  Creatinine 0.57 - 1.00 mg/dL 0.93 0.89 0.98 0.98 0.79   BP/Weight 12/16/2016 12/10/2016 10/16/2016 10/13/2016 09/15/2016 07/13/2016 00/37/0488  Systolic BP 891 694 503 888 280 034 917  Diastolic BP 80 85 80 88 84 84 88  Wt. (Lbs) 232 230.8 232.08 235 234 - -  BMI 38.61 39 38.62 39.11 38.94 - -  Some encounter information is confidential and restricted. Go to Review Flowsheets activity to see all data.   Foot/eye exam completion dates Latest Ref Rng & Units 03/16/2016 12/05/2015  Eye Exam No Retinopathy - No Retinopathy  Foot Form Completion - Done -

## 2016-12-25 ENCOUNTER — Encounter (HOSPITAL_COMMUNITY): Payer: Self-pay | Admitting: Psychiatry

## 2016-12-25 ENCOUNTER — Ambulatory Visit (INDEPENDENT_AMBULATORY_CARE_PROVIDER_SITE_OTHER): Payer: 59 | Admitting: Psychiatry

## 2016-12-25 DIAGNOSIS — F3162 Bipolar disorder, current episode mixed, moderate: Secondary | ICD-10-CM | POA: Diagnosis not present

## 2016-12-25 MED ORDER — ARIPIPRAZOLE 5 MG PO TABS: 5.0000 mg | ORAL_TABLET | Freq: Every day | ORAL | 0 refills | Status: DC

## 2016-12-25 NOTE — Progress Notes (Signed)
BH MD/PA/NP OP Progress Note  12/25/2016 10:28 AM Christina [name]  MRN:  [id number]  Chief Complaint:  Subjective:  I am doing fine.  I still have trouble controlling her blood sugar.  HPI: Patient came for her follow-up appointment.  She is taking Abilify regularly.  Her depression is stable but sometime she has difficulty to motivate herself.  She still have issues controlling her blood sugar.  She admitted this time she is trying to be compliant with her diet strictly.  She has not started exercising or walking but promised to do it so she can lower her hemoglobin A1c.  Her last A1c was 8.2.  She denies any irritability, anger, mania, psychosis, hallucination, paranoia or any crying spells.  She does not want to change her medication.  We discuss that we can try Latuda since she has struggling lowering her hemoglobin C.  Patient does not want to change the medication and she like to motivate herself and promised that she will start walking every day.  Patient has no tremors, shakes or any EPS.  Recently she is thinking to switch her job and she had a job interview in [place] but she does not want to travel every day.  Patient denies drinking alcohol or using any illegal substances.  She had a good supportive network which includes husband, mother-in-law and her own parents.  Visit Diagnosis:    ICD-9-CM ICD-10-CM   1. Bipolar 1 disorder, mixed, moderate (HCC) 296.62 F31.62 ARIPiprazole (ABILIFY) 5 MG tablet    Past Psychiatric History: Reviewed. Patient has been seeing in this office since 2006. She was referred from her primary care physician Dr. Moshe Cipro. She has history of depression and mania and psychosis. In 1997 she was admitted at [facility] because of suicidal gesture.  In the past she has taken Prozac and Wellbutrin.  She denies any history of sexual, verbal emotional abuse.    Past Medical History:  Past Medical History:  Diagnosis Date  . Anemia   . Bipolar disorder ([place])   . Depression    . Diabetes mellitus    type 2 DM, insulin only needed during pregnancy  . Gastroparesis   . 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 GERD (gastroesophageal reflux disease)   . H/O acute sinusitis 10/2016  . Hypertension   . Thyroid enlargement   . Wears glasses     Past Surgical History:  Procedure Laterality Date  . BREAST REDUCTION SURGERY  1994  . CESAREAN SECTION N/A 03/06/2016   Procedure: CESAREAN SECTION;  Surgeon: Ena Dawley, MD;  Location: [place] [facility];  Service: Obstetrics;  Laterality: N/A;  . dermoid tumor  2000  . DILITATION & CURRETTAGE/HYSTROSCOPY WITH NOVASURE ABLATION N/A 12/10/2016   Procedure: DILATATION & CURETTAGE/HYSTEROSCOPY WITH NOVASURE ABLATION;  Surgeon: Ena Dawley, MD;  Location: [facility];  Service: Gynecology;  Laterality: N/A;  . ESOPHAGOGASTRODUODENOSCOPY  07/01/2009   SHF:WYOVZC esphagus without barrett's/dilation with 16 mm/mild erthyema in the antrum. mild chronic gastritis on path.   Theodoro Kalata FINGER RELEASE  Nov 2016  . TUBAL LIGATION Bilateral 03/06/2016   Procedure: BILATERAL TUBAL LIGATION;  Surgeon: Ena Dawley, MD;  Location: [facility];  Service: Obstetrics;  Laterality: Bilateral;    Family Psychiatric History: Reviewed.  Family History:  Family History  Problem Relation Age of Onset  . Diabetes Mother   . Hypertension Mother   . Fibromyalgia Mother   . Diabetes Father   . Hypertension Father   .  Asthma Father   . Heart disease Father   . Colon cancer Neg Hx     Social History:  Social History   Social History  . Marital status: Married    Spouse name: N/A  . Number of children: N/A  . Years of education: N/A   Occupational History  . [employer]    Social History Main Topics  . Smoking status: Never Smoker  . Smokeless tobacco: Never Used     Comment: Never smoked   . Alcohol use No  . Drug use: No  . Sexual activity: Yes    Partners: Male    Birth  control/ protection: Rhythm     Comment: Last Intercourse about 10 days ago   Other Topics Concern  . None   Social History Narrative  . None    Allergies:  Allergies  Allergen Reactions  . Other Anaphylaxis    Tree nuts  . Peanut Oil Anaphylaxis  . Peanut-Containing Drug Products Anaphylaxis  . Ace Inhibitors Cough    Pt on no contaception and is at reproductive age, discussed with nephrology also  . Invokana [Canagliflozin] Other (See Comments)     Worried about ketoacidosis  . Augmentin [Amoxicillin-Pot Clavulanate] Nausea And Vomiting    Causes stomach cramps. Patient can take amoxicillin, not Augmentin.    Metabolic Disorder Labs: Lab Results  Component Value Date   HGBA1C 8.2 (H) 12/12/2016   MPG 154 06/12/2016   MPG 137 03/16/2016   No results found for: PROLACTIN Lab Results  Component Value Date   CHOL 155 03/16/2016   TRIG 131 03/16/2016   HDL 89 03/16/2016   CHOLHDL 1.7 03/16/2016   VLDL 26 03/16/2016   LDLCALC 40 03/16/2016   LDLCALC 60 02/02/2015     Current Medications: Current Outpatient Prescriptions  Medication Sig Dispense Refill  . acetaminophen (TYLENOL) 500 MG tablet Take 500 mg by mouth every 6 (six) hours as needed for mild pain.    . ARIPiprazole (ABILIFY) 5 MG tablet Take 1 tablet (5 mg total) by mouth daily. 90 tablet 0  . azelastine (ASTELIN) 0.1 % nasal spray Place 2 sprays into both nostrils 2 (two) times daily. Use in each nostril as directed 30 mL 12  . Canagliflozin-Metformin HCl (INVOKAMET) [phone] MG TABS Two tablets once daily at breakfast 180 tablet 1  . docusate sodium (COLACE) 100 MG capsule Take 100 mg by mouth daily.    Marland Kitchen glipiZIDE (GLUCOTROL XL) 10 MG 24 hr tablet Two tablets every morning with breakfast 180 tablet 3  . ibuprofen (ADVIL,MOTRIN) 800 MG tablet Take 1 tablet (800 mg total) by mouth every 8 (eight) hours as needed. 50 tablet 1  . loratadine (CLARITIN) 10 MG tablet Take 1 tablet (10 mg total) by mouth daily.  Taking generic otc allergy 90 tablet 1  . losartan (COZAAR) 25 MG tablet TAKE 1 TABLET BY MOUTH  DAILY 90 tablet 1  . omeprazole (PRILOSEC) 20 MG capsule Take 1 capsule (20 mg total) by mouth daily. 90 capsule 3  . ondansetron (ZOFRAN) 4 MG tablet Take 1 tablet (4 mg total) by mouth every 8 (eight) hours as needed for nausea or vomiting. 30 tablet 0  . ONETOUCH DELICA LANCETS 96Q MISC Once daily testing dx e11.9 100 each 5  . ranitidine (ZANTAC) 150 MG tablet Take 1 tablet (150 mg total) by mouth 2 (two) times daily. 60 tablet 1  . triamterene-hydrochlorothiazide (MAXZIDE-25) 37.5-25 MG tablet TAKE 1 TABLET BY MOUTH  EVERY DAY 90 tablet 1  . valACYclovir (VALTREX) 500 MG tablet     . oxyCODONE-acetaminophen (ROXICET) 5-325 MG tablet Take 1 tablet by mouth every 4 (four) hours as needed for severe pain. (Patient not taking: Reported on 12/25/2016) 20 tablet 0   No current facility-administered medications for this visit.     Neurologic: Headache: No Seizure: No Paresthesias: No  Musculoskeletal: Strength & Muscle Tone: within normal limits Gait & Station: normal Patient leans: N/A  Psychiatric Specialty Exam: ROS  Blood pressure 140/86, pulse 96, height 5' 4.5" (1.638 m), weight 230 lb (104.3 kg), last menstrual period 12/02/2016, unknown if currently breastfeeding.Body mass index is 38.87 kg/m.  General Appearance: Casual  Eye Contact:  Good  Speech:  Clear and Coherent  Volume:  Normal  Mood:  Anxious  Affect:  Appropriate  Thought Process:  Goal Directed  Orientation:  Full (Time, Place, and Person)  Thought Content: Logical and Rumination   Suicidal Thoughts:  No  Homicidal Thoughts:  No  Memory:  Immediate;   Good Recent;   Good Remote;   Good  Judgement:  Good  Insight:  Good  Psychomotor Activity:  Normal  Concentration:  Concentration: Good and Attention Span: Good  Recall:  Good  Fund of Knowledge: Good  Language: Good  Akathisia:  No  Handed:  Right  AIMS (if  indicated):  0  Assets:  Communication Skills Desire for Improvement Housing Social Support  ADL's:  Intact  Cognition: WNL  Sleep:  good   Assessment: Bipolar disorder type I.  Plan: Reassurance given and encourage to do regular exercise to lower her blood sugar and also to motivate herself.  We discuss switching the medication if she continued to struggle lowering her blood sugar.  We talked about Latuda but patient does not want to change her medication at this time.  She believe her depression is a stable and her mania and bipolar disorder is doing very well on Abilify.  Recommended to call us back if she has any question or any concern.  I review her blood work results.  Her hemoglobin and sees 8.2.  I will see her again in 3 months.  Cassanda Walmer T., MD 12/25/2016, 10:28 AM

## 2017-01-04 NOTE — Addendum Note (Signed)
Addendum  created 01/04/17 1433 by Lanah Steines, MD   Sign clinical note    

## 2017-01-04 NOTE — Anesthesia Postprocedure Evaluation (Signed)
Anesthesia Post Note  Patient: Christina [name]  Procedure(s) Performed: Procedure(s) (LRB): DILATATION & CURETTAGE/HYSTEROSCOPY WITH NOVASURE ABLATION (N/A)     Anesthesia Post Evaluation  Last Vitals:  Vitals:   12/10/16 1315 12/10/16 1417  BP: (!) 124/92 (!) 142/85  Pulse: 79 85  Resp: 14 18  Temp:  36.6 C    Last Pain:  Vitals:   12/11/16 1720  TempSrc:   PainSc: 4                  Takai Chiaramonte S

## 2017-01-06 ENCOUNTER — Encounter: Payer: Self-pay | Admitting: Family Medicine

## 2017-01-22 ENCOUNTER — Ambulatory Visit: Payer: Self-pay | Admitting: Family Medicine

## 2017-03-01 ENCOUNTER — Encounter: Payer: Self-pay | Admitting: Family Medicine

## 2017-03-01 DIAGNOSIS — E1169 Type 2 diabetes mellitus with other specified complication: Secondary | ICD-10-CM

## 2017-03-01 DIAGNOSIS — I10 Essential (primary) hypertension: Secondary | ICD-10-CM

## 2017-03-01 DIAGNOSIS — D508 Other iron deficiency anemias: Secondary | ICD-10-CM

## 2017-03-01 DIAGNOSIS — E669 Obesity, unspecified: Secondary | ICD-10-CM

## 2017-03-02 ENCOUNTER — Other Ambulatory Visit: Payer: Self-pay | Admitting: Family Medicine

## 2017-03-04 ENCOUNTER — Encounter: Payer: Self-pay | Admitting: Gastroenterology

## 2017-03-04 ENCOUNTER — Ambulatory Visit (INDEPENDENT_AMBULATORY_CARE_PROVIDER_SITE_OTHER): Payer: 59 | Admitting: Gastroenterology

## 2017-03-04 VITALS — BP 116/86 | HR 90 | Temp 97.1°F | Ht 64.5 in | Wt 226.8 lb

## 2017-03-04 DIAGNOSIS — D508 Other iron deficiency anemias: Secondary | ICD-10-CM

## 2017-03-04 DIAGNOSIS — R1013 Epigastric pain: Secondary | ICD-10-CM | POA: Diagnosis not present

## 2017-03-04 MED ORDER — PROMETHAZINE HCL 12.5 MG PO TABS: 12.5000 mg | ORAL_TABLET | Freq: Four times a day (QID) | ORAL | 1 refills | Status: DC | PRN

## 2017-03-04 NOTE — Patient Instructions (Addendum)
Stop Prilosec. Start taking Dexilant once daily. I have also sent in Phenergan to take as needed for nausea in the evenings when you are not at work or driving.   Please have blood work done. We have also scheduled an ultrasound for you.   You will need a colonoscopy+/- upper endoscopy in the future once this acute illness is better.

## 2017-03-04 NOTE — Assessment & Plan Note (Signed)
41 year old pleasant female with week-long history of upper abdominal pain, associated nausea, no vomiting. History of gastroparesis, but pain with gastroparesis is less likely. Gallbladder remains in situ. Differentials including GERD/gastritis, biliary etiology, less likely pancreatitis. 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   1. HFP, BMP, lipase today 2. US abdomen in near future: may need to change imaging modalities if labs concerning for other issue 3. Phenergan 12.5 mg po prn, avoid while driving and at work 4. Change from Prilosec to Dexilant once daily. Samples provided

## 2017-03-04 NOTE — Progress Notes (Signed)
cc'ed to pcp °

## 2017-03-04 NOTE — Assessment & Plan Note (Signed)
Chronic. Last ferritin 13 with normal Hgb. Recently had episode of low-volume hematochezia. No prior colonoscopy. Will need TCS+/- EGD in future once acute illness addressed.

## 2017-03-04 NOTE — Progress Notes (Signed)
Referring Provider: [name], MD Primary Care Physician:  [name], MD Primary GI: Dr. Oneida Alar    Chief Complaint  Patient presents with  . Abdominal Pain    mid upper abd; both sides around to navel  . Nausea  . Rectal Bleeding  . Constipation    after taking Zofran  . Fatigue    HPI:   Christina [name] is a 41 y.o. female presenting today with a history of GERD, gastroparesis, IDA.  Last EGD in 2010 with normal esophagus, mild chronic gastritis. Prilosec once daily. Declined colonoscopy in the past. Last seen a year ago and was doing wonderfully. Had a baby last August 2017.   Made appt today because she saw blood in the toilet yesterday. Has been sick for 7 days with stomach cramping upper abdomen, felt like something was cutting her. Associated nausea but no vomiting. Felt fatigued, headache. Took some Zofran. Taking Prilosec once daily. No NSAIDs. Zofran took the edge off but "still there". Abdominal pain always present. Hasn't eaten much in past week. Some worsening of pain after eating. No prior colonoscopy.   Previously has taken Protonix. Constipation with Zofran.   Past Medical History:  Diagnosis Date  . Anemia   . Bipolar disorder ([place])   . Depression   . Diabetes mellitus    type 2 DM, insulin only needed during pregnancy  . Gastroparesis   . 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 GERD (gastroesophageal reflux disease)   . H/O acute sinusitis 10/2016  . Hypertension   . Thyroid enlargement   . Wears glasses     Past Surgical History:  Procedure Laterality Date  . BREAST REDUCTION SURGERY  1994  . CESAREAN SECTION N/A 03/06/2016   Procedure: CESAREAN SECTION;  Surgeon: Ena Dawley, MD;  Location: [facility];  Service: Obstetrics;  Laterality: N/A;  . dermoid tumor  2000  . DILITATION & CURRETTAGE/HYSTROSCOPY WITH NOVASURE ABLATION N/A 12/10/2016   Procedure: DILATATION & CURETTAGE/HYSTEROSCOPY WITH NOVASURE ABLATION;   Surgeon: Ena Dawley, MD;  Location: [facility];  Service: Gynecology;  Laterality: N/A;  . ESOPHAGOGASTRODUODENOSCOPY  07/01/2009   GYI:RSWNIO esphagus without barrett's/dilation with 16 mm/mild erthyema in the antrum. mild chronic gastritis on path.   Theodoro Kalata FINGER RELEASE  Nov 2016  . TUBAL LIGATION Bilateral 03/06/2016   Procedure: BILATERAL TUBAL LIGATION;  Surgeon: Ena Dawley, MD;  Location: [facility];  Service: Obstetrics;  Laterality: Bilateral;    Current Outpatient Prescriptions  Medication Sig Dispense Refill  . acetaminophen (TYLENOL) 500 MG tablet Take 500 mg by mouth every 6 (six) hours as needed for mild pain.    . ARIPiprazole (ABILIFY) 5 MG tablet Take 1 tablet (5 mg total) by mouth daily. 90 tablet 0  . azelastine (ASTELIN) 0.1 % nasal spray Place 2 sprays into both nostrils 2 (two) times daily. Use in each nostril as directed 30 mL 12  . Canagliflozin-Metformin HCl (INVOKAMET) [phone] MG TABS Two tablets once daily at breakfast 180 tablet 1  . docusate sodium (COLACE) 100 MG capsule Take 100 mg by mouth daily as needed.     Marland Kitchen glipiZIDE (GLUCOTROL XL) 10 MG 24 hr tablet TAKE 1 TABLET BY MOUTH  DAILY WITH BREAKFAST (Patient taking differently: TAKE 1 TABLET BY MOUTH  DAILY WITH BREAKFAST. Takes 2 tablets (20mg ) by mouth daily) 90 tablet 0  . ibuprofen (ADVIL,MOTRIN) 800 MG tablet Take 1 tablet (800 mg total) by mouth  every 8 (eight) hours as needed. 50 tablet 1  . loratadine (CLARITIN) 10 MG tablet Take 1 tablet (10 mg total) by mouth daily. Taking generic otc allergy 90 tablet 1  . losartan (COZAAR) 25 MG tablet TAKE 1 TABLET BY MOUTH  DAILY 90 tablet 1  . omeprazole (PRILOSEC) 20 MG capsule Take 1 capsule (20 mg total) by mouth daily. 90 capsule 3  . ondansetron (ZOFRAN) 4 MG tablet Take 1 tablet (4 mg total) by mouth every 8 (eight) hours as needed for nausea or vomiting. 30 tablet 0  . ONETOUCH DELICA LANCETS 40C MISC Once daily testing  dx e11.9 100 each 5  . OVER THE COUNTER MEDICATION OTC Iron by mouth once a day. (unsure of dose)    . triamterene-hydrochlorothiazide (MAXZIDE-25) 37.5-25 MG tablet TAKE 1 TABLET BY MOUTH  EVERY DAY 90 tablet 1  . valACYclovir (VALTREX) 500 MG tablet Take 500 mg by mouth as needed.     . vitamin C (ASCORBIC ACID) 500 MG tablet Take 500 mg by mouth daily.     No current facility-administered medications for this visit.     Allergies as of 03/04/2017 - Review Complete 03/04/2017  Allergen Reaction Noted  . Other Anaphylaxis 06/13/2013  . 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 Peanut-containing drug products Anaphylaxis 02/08/2013  . Ace inhibitors Cough 10/10/2010  . Invokana [canagliflozin] Other (See Comments) 04/18/2014  . Augmentin [amoxicillin-pot clavulanate] Nausea And Vomiting 04/07/2013    Family History  Problem Relation Age of Onset  . Diabetes Mother   . Hypertension Mother   . Fibromyalgia Mother   . Diabetes Father   . Hypertension Father   . Asthma Father   . Heart disease Father   . Colon cancer Neg Hx     Social History   Social History  . Marital status: Married    Spouse name: N/A  . Number of children: N/A  . Years of education: N/A   Occupational History  . [employer]    Social History Main Topics  . Smoking status: Never Smoker  . Smokeless tobacco: Never Used     Comment: Never smoked   . Alcohol use 0.0 oz/week     Comment: occ; had 2 glasses of wine last week while on vacation  . Drug use: No  . Sexual activity: Yes    Partners: Male    Birth control/ protection: Rhythm     Comment: Last Intercourse about 10 days ago   Other Topics Concern  . None   Social History Narrative  . None    Review of Systems: As mentioned in HPI   Physical Exam: BP 116/86   Pulse 90   Temp (!) 97.1 F (36.2 C) (Oral)   Ht 5' 4.5" (1.638 m)   Wt 226 lb 12.8 oz (102.9 kg)   BMI 38.33 kg/m  General:   Alert and oriented. No  distress noted. Pleasant and cooperative.  Head:  Normocephalic and atraumatic. Eyes:  Conjuctiva clear without scleral icterus. Mouth:  Oral mucosa pink and moist.  Heart:  S1, S2 present without murmurs Abdomen:  +BS, soft, mild TTP upper epigastric and non-distended. No rebound or guarding. No HSM or masses noted. Msk:  Symmetrical without gross deformities. Normal posture. Extremities:  Without edema. Neurologic:  Alert and  oriented x4;  grossly normal neurologically. Psych:  Alert and cooperative. Normal mood and affect.  Lab Results  Component Value Date   IRON 47 12/12/2016  TIBC 525 (H) 01/18/2007   FERRITIN 13 (L) 12/12/2016   Lab Results  Component Value Date   WBC 5.8 12/12/2016   HGB 13.1 12/12/2016   HCT 41.2 12/12/2016   MCV 85 12/12/2016   PLT 359 12/12/2016

## 2017-03-05 ENCOUNTER — Other Ambulatory Visit: Payer: Self-pay | Admitting: Gastroenterology

## 2017-03-05 LAB — BASIC METABOLIC PANEL
BUN/Creatinine Ratio: 9 (ref 9–23)
BUN: 8 mg/dL (ref 6–24)
CO2: 23 mmol/L (ref 20–29)
Calcium: 9.9 mg/dL (ref 8.7–10.2)
Chloride: 99 mmol/L (ref 96–106)
Creatinine, Ser: 0.87 mg/dL (ref 0.57–1.00)
GFR calc Af Amer: 96 mL/min/{1.73_m2} (ref >59)
GFR calc non Af Amer: 83 mL/min/{1.73_m2} (ref >59)
Glucose: 203 mg/dL — ABNORMAL HIGH (ref 65–99)
Potassium: 4.1 mmol/L (ref 3.5–5.2)
Sodium: 139 mmol/L (ref 134–144)

## 2017-03-05 LAB — HEPATIC FUNCTION PANEL
ALT: 13 IU/L (ref 0–32)
AST: 14 IU/L (ref 0–40)
Albumin: 4.6 g/dL (ref 3.5–5.5)
Alkaline Phosphatase: 74 IU/L (ref 39–117)
Bilirubin Total: 0.3 mg/dL (ref 0.0–1.2)
Bilirubin, Direct: 0.11 mg/dL (ref 0.00–0.40)
Total Protein: 7.1 g/dL (ref 6.0–8.5)

## 2017-03-05 LAB — LIPASE: Lipase: 55 U/L (ref 14–72)

## 2017-03-05 MED ORDER — METOCLOPRAMIDE HCL 5 MG PO TABS: 5.0000 mg | ORAL_TABLET | Freq: Three times a day (TID) | ORAL | 1 refills | Status: DC

## 2017-03-07 NOTE — Progress Notes (Signed)
REVIEWED-NO ADDITIONAL RECOMMENDATIONS. 

## 2017-03-08 ENCOUNTER — Telehealth: Payer: Self-pay | Admitting: Family Medicine

## 2017-03-08 DIAGNOSIS — E1169 Type 2 diabetes mellitus with other specified complication: Secondary | ICD-10-CM

## 2017-03-08 DIAGNOSIS — I10 Essential (primary) hypertension: Secondary | ICD-10-CM

## 2017-03-08 DIAGNOSIS — E669 Obesity, unspecified: Secondary | ICD-10-CM

## 2017-03-08 DIAGNOSIS — D508 Other iron deficiency anemias: Secondary | ICD-10-CM

## 2017-03-08 DIAGNOSIS — Z1329 Encounter for screening for other suspected endocrine disorder: Secondary | ICD-10-CM

## 2017-03-08 NOTE — Telephone Encounter (Signed)
Patient called. She asks that we reorder labs for [facility] instead of [facility] and mail orders to her.  Done

## 2017-03-10 ENCOUNTER — Ambulatory Visit (HOSPITAL_COMMUNITY)
Admission: RE | Admit: 2017-03-10 | Discharge: 2017-03-10 | Disposition: A | Discharge status: Home / Self Care | Payer: 59 | Source: Ambulatory Visit | Attending: Gastroenterology | Admitting: Gastroenterology

## 2017-03-10 DIAGNOSIS — R1013 Epigastric pain: Secondary | ICD-10-CM | POA: Diagnosis present

## 2017-03-11 ENCOUNTER — Encounter: Payer: Self-pay | Admitting: Gastroenterology

## 2017-03-11 NOTE — Progress Notes (Signed)
Pt is aware we will schedule her OV first.  Forwarding to [place] to schedule.

## 2017-03-11 NOTE — Progress Notes (Signed)
Let's hold on HIDA scan.

## 2017-03-11 NOTE — Progress Notes (Signed)
Pt is aware. Said she is doing very well. She is still having a little pain and tenderness above her belly button. Some nausea, but has Zofran. Does she need the HIDA scan before appt?

## 2017-03-11 NOTE — Progress Notes (Signed)
APPT MADE AND LETTER SENT  °

## 2017-03-11 NOTE — Progress Notes (Signed)
Ultrasound negative for gallstones. How is she doing? We need to get her back in for a visit to discuss TCS+/- EGD. If pain still present and nausea, needs HIDA. Still needs appt with [place] as well soon.

## 2017-03-12 ENCOUNTER — Telehealth: Payer: Self-pay | Admitting: Gastroenterology

## 2017-03-12 ENCOUNTER — Encounter: Payer: Self-pay | Admitting: Gastroenterology

## 2017-03-12 MED ORDER — DEXLANSOPRAZOLE 60 MG PO CPDR: 60.0000 mg | DELAYED_RELEASE_CAPSULE | Freq: Every day | ORAL | 3 refills | Status: DC

## 2017-03-12 NOTE — Telephone Encounter (Signed)
Dexilant sent to [facility] as per request.  Erline Levine: is there anything open in September where I could see her sooner

## 2017-03-20 ENCOUNTER — Other Ambulatory Visit: Payer: Self-pay | Admitting: Family Medicine

## 2017-03-22 ENCOUNTER — Ambulatory Visit: Payer: Self-pay | Admitting: Family Medicine

## 2017-03-22 LAB — COMPREHENSIVE METABOLIC PANEL
ALT: 10 IU/L (ref 0–32)
AST: 15 IU/L (ref 0–40)
Albumin/Globulin Ratio: 1.5 (ref 1.2–2.2)
Albumin: 4.4 g/dL (ref 3.5–5.5)
Alkaline Phosphatase: 63 IU/L (ref 39–117)
BUN/Creatinine Ratio: 11 (ref 9–23)
BUN: 11 mg/dL (ref 6–24)
Bilirubin Total: 0.6 mg/dL (ref 0.0–1.2)
CO2: 20 mmol/L (ref 20–29)
Calcium: 9.8 mg/dL (ref 8.7–10.2)
Chloride: 100 mmol/L (ref 96–106)
Creatinine, Ser: 0.96 mg/dL (ref 0.57–1.00)
GFR calc Af Amer: 85 mL/min/{1.73_m2} (ref >59)
GFR calc non Af Amer: 74 mL/min/{1.73_m2} (ref >59)
Globulin, Total: 2.9 g/dL (ref 1.5–4.5)
Glucose: 275 mg/dL — ABNORMAL HIGH (ref 65–99)
Potassium: 3.7 mmol/L (ref 3.5–5.2)
Sodium: 138 mmol/L (ref 134–144)
Total Protein: 7.3 g/dL (ref 6.0–8.5)

## 2017-03-22 LAB — AMBIG ABBREV CMP14 DEFAULT

## 2017-03-22 LAB — IRON: Iron: 158 ug/dL (ref 27–159)

## 2017-03-22 LAB — HGB A1C W/O EAG: Hgb A1c MFr Bld: 8.2 % — ABNORMAL HIGH (ref 4.8–5.6)

## 2017-03-22 LAB — TSH: TSH: 0.822 u[IU]/mL (ref 0.450–4.500)

## 2017-03-22 LAB — FERRITIN: Ferritin: 41 ng/mL (ref 15–150)

## 2017-03-23 ENCOUNTER — Encounter: Payer: Self-pay | Admitting: Family Medicine

## 2017-03-23 ENCOUNTER — Ambulatory Visit (INDEPENDENT_AMBULATORY_CARE_PROVIDER_SITE_OTHER): Payer: 59 | Admitting: Family Medicine

## 2017-03-23 VITALS — BP 118/80 | HR 91 | Resp 16 | Ht 65.0 in | Wt 228.0 lb

## 2017-03-23 DIAGNOSIS — E669 Obesity, unspecified: Secondary | ICD-10-CM | POA: Diagnosis not present

## 2017-03-23 DIAGNOSIS — I10 Essential (primary) hypertension: Secondary | ICD-10-CM

## 2017-03-23 DIAGNOSIS — E1169 Type 2 diabetes mellitus with other specified complication: Secondary | ICD-10-CM

## 2017-03-23 MED ORDER — ONETOUCH DELICA LANCETS 33G MISC: 5 refills | Status: DC

## 2017-03-23 MED ORDER — GLIPIZIDE ER 10 MG PO TB24: ORAL_TABLET | ORAL | 1 refills | Status: DC

## 2017-03-23 MED ORDER — GLUCOSE BLOOD VI STRP: ORAL_STRIP | 12 refills | Status: DC

## 2017-03-23 NOTE — Progress Notes (Signed)
Christina [name]     MRN: [id number]      DOB: 04-Jun-1976   HPI Christina [name] is here for follow up and re-evaluation of chronic medical conditions, medication management and review of any available recent lab and radiology data.  Preventive health is updated, specifically  Cancer screening and Immunization.   Questions or concerns regarding consultations or procedures which the PT has had in the interim are  Addressed. Was considering bariatric surgery but instead is opting for weight management counseling. Has not been testing FBG as she should, and this is generally 150 to 160. Now is committing to daily testing and states she knows that her goal is 120 or less  The PT denies any adverse reactions to current medications since the last visit.    ROS Denies recent fever or chills. Denies sinus pressure, nasal congestion, ear pain or sore throat. Denies chest congestion, productive cough or wheezing. Denies chest pains, palpitations and leg swelling Denies abdominal pain, nausea, vomiting,diarrhea or constipation.   Denies dysuria, frequency, hesitancy or incontinence. Denies joint pain, swelling and limitation in mobility. Denies headaches, seizures, numbness, or tingling. Denies uncontrolled  depression, anxiety or insomnia. Denies skin break down or rash.   PE  BP 118/80   Pulse 91   Resp 16   Ht 5\' 5"  (1.651 m)   Wt 228 lb (103.4 kg)   SpO2 98%   BMI 37.94 kg/m   Patient alert and oriented and in no cardiopulmonary distress.  HEENT: No facial asymmetry, EOMI,   oropharynx pink and moist.  Neck supple no JVD, no mass.  Chest: Clear to auscultation bilaterally.  CVS: S1, S2 no murmurs, no S3.Regular rate.  ABD: Soft non tender.   Ext: No edema  MS: Adequate ROM spine, shoulders, hips and knees.  Skin: Intact, no ulcerations or rash noted.  Psych: Good eye contact, normal affect. Memory intact not anxious or depressed appearing.  CNS: CN 2-12 intact, power,  normal  throughout.no focal deficits noted.   Assessment & Plan  Essential hypertension, benign Controlled, no change in medication DASH diet and commitment to daily physical activity for a minimum of 30 minutes discussed and encouraged, as a part of hypertension management. The importance of attaining a healthy weight is also discussed.  BP/Weight 03/23/2017 03/04/2017 12/16/2016 12/10/2016 10/16/2016 10/13/2016 2/97/9892  Systolic BP 119 417 408 144 818 563 149  Diastolic BP 80 86 80 85 80 88 84  Wt. (Lbs) 228 226.8 232 230.8 232.08 235 234  BMI 37.94 38.33 38.61 39 38.62 39.11 38.94  Some encounter information is confidential and restricted. Go to Review Flowsheets activity to see all data.       Morbid obesity Improved Patient re-educated about  the importance of commitment to a  minimum of 150 minutes of exercise per week.  The importance of healthy food choices with portion control discussed. Encouraged to start a food diary, count calories and to consider  joining a support group. Sample diet sheets offered. Goals set by the patient for the next several months.   Weight /BMI 03/23/2017 03/04/2017 12/16/2016  WEIGHT 228 lb 226 lb 12.8 oz 232 lb  HEIGHT 5\' 5"  5' 4.5" 5\' 5"   BMI 37.94 kg/m2 38.33 kg/m2 38.61 kg/m2  Some encounter information is confidential and restricted. Go to Review Flowsheets activity to see all data.      Diabetes mellitus type 2 in obese (HCC) Uncontrolled and unchanged Christina [name] is reminded of the importance of commitment  to daily physical activity for 30 minutes or more, as able and the need to limit carbohydrate intake to 30 to 60 grams per meal to help with blood sugar control.   The need to take medication as prescribed, test blood sugar as directed, and to call between visits if there is a concern that blood sugar is uncontrolled is also discussed.   Christina [name] is reminded of the importance of daily foot exam, annual eye examination, and good blood sugar,  blood pressure and cholesterol control.  Diabetic Labs Latest Ref Rng & Units 03/20/2017 03/04/2017 12/12/2016 12/04/2016 09/10/2016  HbA1c 4.8 - 5.6 % 8.2(H) - 8.2(H) - -  Microalbumin Not Estab. ug/mL - - - - -  Micro/Creat Ratio 0.0 - 30.0 mg/g creat - - - - -  Chol 125 - 200 mg/dL - - - - -  HDL >=46 mg/dL - - - - -  Calc LDL <130 mg/dL - - - - -  Triglycerides <150 mg/dL - - - - -  Creatinine 0.57 - 1.00 mg/dL 0.96 0.87 0.93 0.89 0.98   BP/Weight 03/23/2017 03/04/2017 12/16/2016 12/10/2016 10/16/2016 10/13/2016 8/61/6837  Systolic BP 290 211 155 208 022 336 122  Diastolic BP 80 86 80 85 80 88 84  Wt. (Lbs) 228 226.8 232 230.8 232.08 235 234  BMI 37.94 38.33 38.61 39 38.62 39.11 38.94  Some encounter information is confidential and restricted. Go to Review Flowsheets activity to see all data.   Foot/eye exam completion dates Latest Ref Rng & Units 12/16/2016 03/16/2016  Eye Exam No Retinopathy No Retinopathy -  Foot Form Completion - - Done      Increase glipizide to 20 mg daily Updated lab needed at/ before next visit.

## 2017-03-23 NOTE — Assessment & Plan Note (Signed)
Improved Patient re-educated about  the importance of commitment to a  minimum of 150 minutes of exercise per week.  The importance of healthy food choices with portion control discussed. Encouraged to start a food diary, count calories and to consider  joining a support group. Sample diet sheets offered. Goals set by the patient for the next several months.   Weight /BMI 03/23/2017 03/04/2017 12/16/2016  WEIGHT 228 lb 226 lb 12.8 oz 232 lb  HEIGHT 5\' 5"  5' 4.5" 5\' 5"   BMI 37.94 kg/m2 38.33 kg/m2 38.61 kg/m2  Some encounter information is confidential and restricted. Go to Review Flowsheets activity to see all data.

## 2017-03-23 NOTE — Assessment & Plan Note (Signed)
Uncontrolled and unchanged Christina [name] is reminded of the importance of commitment to daily physical activity for 30 minutes or more, as able and the need to limit carbohydrate intake to 30 to 60 grams per meal to help with blood sugar control.   The need to take medication as prescribed, test blood sugar as directed, and to call between visits if there is a concern that blood sugar is uncontrolled is also discussed.   Christina [name] is reminded of the importance of daily foot exam, annual eye examination, and good blood sugar, blood pressure and cholesterol control.  Diabetic Labs Latest Ref Rng & Units 03/20/2017 03/04/2017 12/12/2016 12/04/2016 09/10/2016  HbA1c 4.8 - 5.6 % 8.2(H) - 8.2(H) - -  Microalbumin Not Estab. ug/mL - - - - -  Micro/Creat Ratio 0.0 - 30.0 mg/g creat - - - - -  Chol 125 - 200 mg/dL - - - - -  HDL >=46 mg/dL - - - - -  Calc LDL <130 mg/dL - - - - -  Triglycerides <150 mg/dL - - - - -  Creatinine 0.57 - 1.00 mg/dL 0.96 0.87 0.93 0.89 0.98   BP/Weight 03/23/2017 03/04/2017 12/16/2016 12/10/2016 10/16/2016 10/13/2016 2/33/6122  Systolic BP 449 753 005 110 211 173 567  Diastolic BP 80 86 80 85 80 88 84  Wt. (Lbs) 228 226.8 232 230.8 232.08 235 234  BMI 37.94 38.33 38.61 39 38.62 39.11 38.94  Some encounter information is confidential and restricted. Go to Review Flowsheets activity to see all data.   Foot/eye exam completion dates Latest Ref Rng & Units 12/16/2016 03/16/2016  Eye Exam No Retinopathy No Retinopathy -  Foot Form Completion - - Done      Increase glipizide to 20 mg daily Updated lab needed at/ before next visit.

## 2017-03-23 NOTE — Patient Instructions (Signed)
F/u in 3 month, call if you need me sooner  Increase glipizide to TWO every morning   Check blood sugar every morning  Goal is 80 to 120  It is important that you exercise regularly at least 30 minutes 7 times a week. If you develop chest pain, have severe difficulty breathing, or feel very tired, stop exercising immediately and seek medical attention   Fasting lipid, cmp and EGFR and hBA1C from [facility] 3 days before next visit  Test supplies to [facility] for once daily testing will be sent in  by nurse  Reconsider flu vaccine, needed!

## 2017-03-23 NOTE — Assessment & Plan Note (Signed)
Controlled, no change in medication DASH diet and commitment to daily physical activity for a minimum of 30 minutes discussed and encouraged, as a part of hypertension management. The importance of attaining a healthy weight is also discussed.  BP/Weight 03/23/2017 03/04/2017 12/16/2016 12/10/2016 10/16/2016 10/13/2016 04/14/2582  Systolic BP 462 194 712 527 129 290 903  Diastolic BP 80 86 80 85 80 88 84  Wt. (Lbs) 228 226.8 232 230.8 232.08 235 234  BMI 37.94 38.33 38.61 39 38.62 39.11 38.94  Some encounter information is confidential and restricted. Go to Review Flowsheets activity to see all data.

## 2017-03-26 ENCOUNTER — Ambulatory Visit (HOSPITAL_COMMUNITY): Payer: Self-pay | Admitting: Psychiatry

## 2017-03-28 ENCOUNTER — Other Ambulatory Visit: Payer: Self-pay | Admitting: Family Medicine

## 2017-03-30 ENCOUNTER — Ambulatory Visit (HOSPITAL_COMMUNITY): Payer: Self-pay | Admitting: Psychiatry

## 2017-04-05 ENCOUNTER — Encounter: Payer: Self-pay | Admitting: Family Medicine

## 2017-04-06 ENCOUNTER — Telehealth: Payer: Self-pay | Admitting: Family Medicine

## 2017-04-06 ENCOUNTER — Other Ambulatory Visit: Payer: Self-pay | Admitting: Family Medicine

## 2017-04-06 MED ORDER — INSULIN GLARGINE 100 UNITS/ML SOLOSTAR PEN: 15.0000 [IU] | PEN_INJECTOR | Freq: Every day | SUBCUTANEOUS | 11 refills | Status: DC

## 2017-04-06 MED ORDER — METFORMIN HCL 1000 MG PO TABS: 1000.0000 mg | ORAL_TABLET | Freq: Two times a day (BID) | ORAL | 3 refills | Status: DC

## 2017-04-06 MED ORDER — METFORMIN HCL 1000 MG PO TABS: 1000.0000 mg | ORAL_TABLET | Freq: Two times a day (BID) | ORAL | 1 refills | Status: DC

## 2017-04-06 NOTE — Telephone Encounter (Signed)
Help needed please. Pt starting insulin and I need you tp prescribe the needles for lantus pens so she can give herself the medication, also testing can be increased to 3 times daily now commited to insulin , so test supplies to 3 times daily, We will also need to discuss 3 month supply script to [facility] for her lantus Thanks

## 2017-04-07 ENCOUNTER — Other Ambulatory Visit: Payer: Self-pay | Admitting: Family Medicine

## 2017-04-07 MED ORDER — INSULIN GLARGINE 100 UNIT/ML SOLOSTAR PEN: 15.0000 [IU] | PEN_INJECTOR | Freq: Every day | SUBCUTANEOUS | 3 refills | Status: DC

## 2017-04-08 MED ORDER — ONETOUCH DELICA LANCETS 33G MISC: 5 refills | Status: DC

## 2017-04-08 MED ORDER — GLUCOSE BLOOD VI STRP: ORAL_STRIP | 12 refills | Status: DC

## 2017-04-09 ENCOUNTER — Encounter: Payer: Self-pay | Admitting: Family Medicine

## 2017-04-14 ENCOUNTER — Ambulatory Visit (HOSPITAL_COMMUNITY): Payer: 59 | Admitting: Psychiatry

## 2017-04-14 ENCOUNTER — Other Ambulatory Visit: Payer: Self-pay | Admitting: Family Medicine

## 2017-04-14 MED ORDER — INSULIN LISPRO PROT & LISPRO (75-25 MIX) 100 UNIT/ML KWIKPEN: 15.0000 [IU] | PEN_INJECTOR | Freq: Every day | SUBCUTANEOUS | 1 refills | Status: DC

## 2017-04-14 MED ORDER — INSULIN LISPRO PROT & LISPRO (75-25 MIX) 100 UNIT/ML KWIKPEN: 15.0000 [IU] | PEN_INJECTOR | Freq: Every day | SUBCUTANEOUS | 3 refills | Status: DC

## 2017-04-14 NOTE — Progress Notes (Unsigned)
humalog

## 2017-04-15 ENCOUNTER — Other Ambulatory Visit: Payer: Self-pay

## 2017-04-15 MED ORDER — "PEN NEEDLES 3/16"" 31G X 5 MM MISC": 0 refills | Status: DC

## 2017-04-15 NOTE — Progress Notes (Signed)
Patient needs pen needles for insulin. Sent to [facility]

## 2017-04-18 ENCOUNTER — Encounter: Payer: Self-pay | Admitting: Family Medicine

## 2017-04-30 ENCOUNTER — Ambulatory Visit (HOSPITAL_COMMUNITY): Payer: 59 | Admitting: Psychiatry

## 2017-05-03 ENCOUNTER — Encounter: Payer: Self-pay | Admitting: Family Medicine

## 2017-05-11 ENCOUNTER — Ambulatory Visit (INDEPENDENT_AMBULATORY_CARE_PROVIDER_SITE_OTHER): Payer: 59 | Admitting: Family Medicine

## 2017-05-11 ENCOUNTER — Encounter: Payer: Self-pay | Admitting: Family Medicine

## 2017-05-11 VITALS — BP 110/76 | HR 84 | Temp 98.2°F | Resp 16 | Ht 65.0 in | Wt 233.1 lb

## 2017-05-11 DIAGNOSIS — J0111 Acute recurrent frontal sinusitis: Secondary | ICD-10-CM | POA: Diagnosis not present

## 2017-05-11 MED ORDER — DOXYCYCLINE HYCLATE 100 MG PO TABS: 100.0000 mg | ORAL_TABLET | Freq: Two times a day (BID) | ORAL | 0 refills | Status: DC

## 2017-05-11 NOTE — Progress Notes (Signed)
Patient ID: Christina [name], female    DOB: 1976-07-30, 41 y.o.   MRN: [id number]  Chief Complaint  Patient presents with  . Sinus Problem    x 2 days    Allergies Other; Peanut oil; Peanut-containing drug products; Ace inhibitors; Invokana [canagliflozin]; and Augmentin [amoxicillin-pot clavulanate]  Subjective:   Christina [name] is a 41 y.o. female who presents to [facility] today.  HPI Patient comes in today for an acute visit. Reports that has a long history of sinus infections and allergies. Reports that for the past several days has had pain in the frontal and maxillary region of head. Has had nasal drainage which is dark yellow in color. Has had a sinus headache on and off for the past several days. Not the worst headache of her life. No fevers. Denies any shortness of breath fever chest pain or difficulty swallowing. Has been taking her Claritin and Astelin nasal spray as prescribed. Reports is unable to take steroid nasal spray due to side effects. Has had sinus surgery in the past. Denies any dental problems. Does not smoke. Reports that several times a year gets terrible sinus infections and if waits too long that the infections completely knock her out and she is sick for multiple weeks. Came from her job today to get treatment. He is usually followed by Dr. Moshe Cipro. Reports that she is not having problems with blood sugars at this time. Unable to use over-the-counter decongestants because of history of hypertension. Has been using some saline nasal spray but it does not help. Nothing seems to make her symptoms worse other than bending down. Just seems to feel bad constantly over the past several days. Reports intolerance to Augmentin. Has had to use Levaquin for sinus infections in the past. Does not feel that Zithromax ever gets rid of her sinus infections.    Past Medical History:  Diagnosis Date  . Anemia   . Bipolar disorder ([place])   . Depression   . Diabetes  mellitus    type 2 DM, insulin only needed during pregnancy  . Gastroparesis   . 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 GERD (gastroesophageal reflux disease)   . H/O acute sinusitis 10/2016  . Hypertension   . Thyroid enlargement   . Wears glasses     Past Surgical History:  Procedure Laterality Date  . BREAST REDUCTION SURGERY  1994  . CESAREAN SECTION N/A 03/06/2016   Procedure: CESAREAN SECTION;  Surgeon: Ena Dawley, MD;  Location: [place];  Service: Obstetrics;  Laterality: N/A;  . dermoid tumor  2000  . DILITATION & CURRETTAGE/HYSTROSCOPY WITH NOVASURE ABLATION N/A 12/10/2016   Procedure: DILATATION & CURETTAGE/HYSTEROSCOPY WITH NOVASURE ABLATION;  Surgeon: Ena Dawley, MD;  Location[facility];  Service: Gynecology;  Laterality: N/A;  . ESOPHAGOGASTRODUODENOSCOPY  07/01/2009   FXT:[id number] esphagus without barrett's/dilation with 16 mm/mild erthyema in the antrum. mild chronic gastritis on path.   Theodoro Kalata FINGER RELEASE  Nov 2016  . TUBAL LIGATION Bilateral 03/06/2016   Procedure: BILATERAL TUBAL LIGATION;  Surgeon: Ena Dawley, MD;  Location: [facility];  Service: Obstetrics;  Laterality: Bilateral;    Family History  Problem Relation Age of Onset  . Diabetes Mother   . Hypertension Mother   . Fibromyalgia Mother   . Diabetes Father   . Hypertension Father   . Asthma Father   . Heart disease Father   . Colon cancer Neg Hx  Social History   Social History  . Marital status: Married    Spouse name: N/A  . Number of children: N/A  . Years of education: N/A   Occupational History  . [employer]    Social History Main Topics  . Smoking status: Never Smoker  . Smokeless tobacco: Never Used     Comment: Never smoked   . Alcohol use 0.0 oz/week     Comment: occ; had 2 glasses of wine last week while on vacation  . Drug use: No  . Sexual activity: Yes    Partners: Male    Birth control/  protection: Rhythm     Comment: Last Intercourse about 10 days ago   Other Topics Concern  . Not on file   Social History Narrative  . No narrative on file    Review of Systems  Constitutional: Positive for chills and fatigue. Negative for appetite change, fever and unexpected weight change.  HENT: Positive for ear pain and sinus pressure. Negative for dental problem, ear discharge, facial swelling, nosebleeds, sinus pain, sneezing and trouble swallowing.   Eyes: Positive for itching. Negative for photophobia, discharge and visual disturbance.  Respiratory: Negative for cough, choking, chest tightness, shortness of breath, wheezing and stridor.   Gastrointestinal: Negative for diarrhea, nausea and vomiting.  Endocrine: Negative for polydipsia.  Genitourinary: Negative for dysuria.  Skin: Negative for rash.  Neurological: Negative for dizziness, syncope, speech difficulty, weakness and light-headedness.  Hematological: Negative for adenopathy.  Psychiatric/Behavioral: Negative for dysphoric mood.     Objective:   BP 110/76 (BP Location: Right Arm, Patient Position: Sitting, Cuff Size: Large)   Pulse 84   Temp 98.2 F (36.8 C) (Temporal)   Resp 16   Ht 5\' 5"  (1.651 m)   Wt 233 lb 1.3 oz (105.7 kg)   SpO2 99%   BMI 38.79 kg/m   Physical Exam  Constitutional: She is oriented to person, place, and time. She appears well-developed and well-nourished.  HENT:  Head: Normocephalic and atraumatic.  Right Ear: External ear normal. No foreign bodies. Tympanic membrane is retracted. Tympanic membrane is not erythematous.  Left Ear: External ear normal. No foreign bodies. Tympanic membrane is not erythematous and not retracted.  Nose: Mucosal edema and rhinorrhea present. No nose lacerations, sinus tenderness, septal deviation or nasal septal hematoma.  No foreign bodies. Right sinus exhibits maxillary sinus tenderness and frontal sinus tenderness. Left sinus exhibits maxillary sinus  tenderness and frontal sinus tenderness.  Mouth/Throat: Uvula is midline, oropharynx is clear and moist and mucous membranes are normal. No oral lesions. No oropharyngeal exudate, posterior oropharyngeal edema or posterior oropharyngeal erythema. No tonsillar exudate.  Eyes: Pupils are equal, round, and reactive to light. Right eye exhibits no discharge. Left eye exhibits no discharge. No scleral icterus.  Neck: Normal range of motion. Neck supple. No tracheal deviation present.  Cardiovascular: Normal rate and regular rhythm.   Pulmonary/Chest: Effort normal and breath sounds normal. No respiratory distress. She has no wheezes. She has no rales.  Abdominal: Soft. Bowel sounds are normal.  Neurological: She is alert and oriented to person, place, and time. No cranial nerve deficit. Coordination normal.  Skin: Skin is warm and dry. No erythema.  Psychiatric: She has a normal mood and affect.  Vitals reviewed.    Assessment and Plan   1. Acute recurrent frontal sinusitis Continue allergy medications. Discussed with patient in detail that this is probable viral etiology but due to her allergies and history  of persistent sinus infection agreeable to give her antibiotics at this time. Patient agrees that if symptoms are not improved over the next several days that she will begin antibiotic treatment. Patient told to call with any questions or concerns and keep regular visits with her primary doctor for her routine maintenance.  Increase fluid intake. Note given for work today. - doxycycline (VIBRA-TABS) 100 MG tablet; Take 1 tablet (100 mg total) by mouth 2 (two) times daily.  Dispense: 20 tablet; Refill: 0    No Follow-up on file. Caren Macadam, MD 05/11/2017

## 2017-05-12 ENCOUNTER — Telehealth: Payer: Self-pay | Admitting: Family Medicine

## 2017-05-12 NOTE — Telephone Encounter (Signed)
Patient left message on nurse line asking for work note for today. She states she got one for yesterday but thinks she is going through vertigo or something. Wants to know if a note for today can be sent through [facility]- she is on regularly scheduled PTO tomorrow.

## 2017-05-12 NOTE — Telephone Encounter (Signed)
Please give her a note for today. Advise that if develops any worrisome s/s to come back to office or seek help. Gwen Her. Mannie Stabile, MD

## 2017-05-13 ENCOUNTER — Other Ambulatory Visit: Payer: Self-pay

## 2017-05-13 ENCOUNTER — Encounter: Payer: Self-pay | Admitting: Gastroenterology

## 2017-05-13 ENCOUNTER — Ambulatory Visit (INDEPENDENT_AMBULATORY_CARE_PROVIDER_SITE_OTHER): Payer: 59 | Admitting: Gastroenterology

## 2017-05-13 ENCOUNTER — Telehealth: Payer: Self-pay

## 2017-05-13 VITALS — BP 137/91 | HR 94 | Temp 98.1°F | Ht 64.5 in | Wt 234.6 lb

## 2017-05-13 DIAGNOSIS — K219 Gastro-esophageal reflux disease without esophagitis: Secondary | ICD-10-CM | POA: Diagnosis not present

## 2017-05-13 DIAGNOSIS — K3184 Gastroparesis: Secondary | ICD-10-CM

## 2017-05-13 DIAGNOSIS — K625 Hemorrhage of anus and rectum: Secondary | ICD-10-CM | POA: Diagnosis not present

## 2017-05-13 MED ORDER — CLENPIQ 10-3.5-12 MG-GM -GM/160ML PO SOLN: 1.0000 | Freq: Once | ORAL | 0 refills | Status: AC

## 2017-05-13 NOTE — Patient Instructions (Signed)
PA info for TCS/+/-EGD submitted via [facility] website. Case approved. Notification/prior authorization reference number is [id number].

## 2017-05-13 NOTE — Telephone Encounter (Signed)
Called and informed pt of pre-op appt 06/16/17 at 10:00am. Letter also mailed.  Pt requested work note for the day of her procedure-06/22/17. Work note mailed to her.

## 2017-05-13 NOTE — Patient Instructions (Signed)
We have scheduled you for a colonoscopy and possible upper endoscopy with Dr. Oneida Alar.  Do not take oral medications day of procedure. Only 1/2 dose of insulin the evening before.

## 2017-05-13 NOTE — Progress Notes (Signed)
Referring Provider: [name], MD Primary Care Physician:  [name], MD Primary GI: Dr. Oneida Alar   Chief Complaint  Patient presents with  . Abdominal Pain    Still having issues    HPI:   Christina [name] is a 41 y.o. female presenting today with a history of GERD, gastropareiss, IDA. Last EGD in 2010 with normal esophagus, mild chronic gastritis. Has declined colonoscopy in the past. Baby born Aug 2017. Last seen in Aug 2018 and had noted rectal bleeding. Had new onset dyspepsia at last visit with thus far US abdomen normal, LFTs normal. Ferritin low at 13 in May 2018 and now improved to 41. Lipase normal.   States in interim from last being seen, she had been in a gastroparesis study. She had to withdraw due to side effects. She has been on antibiotics for sinus issues, on day 3 of 10. States her abdominal pain is better since last visit; however, her stomach was "a little upset" this morning with antibiotics. N/V controlled with Zofran prn and Phenergan prn. Dexilant is working well. She has had no further rectal bleeding.   Past Medical History:  Diagnosis Date  . Anemia   . Bipolar disorder ([place])   . Depression   . Diabetes mellitus    type 2 DM, insulin only needed during pregnancy  . Gastroparesis   . 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 GERD (gastroesophageal reflux disease)   . H/O acute sinusitis 10/2016  . Hypertension   . Thyroid enlargement   . Wears glasses     Past Surgical History:  Procedure Laterality Date  . BREAST REDUCTION SURGERY  1994  . CESAREAN SECTION N/A 03/06/2016   Procedure: CESAREAN SECTION;  Surgeon: Ena Dawley, MD;  Location: [place];  Service: Obstetrics;  Laterality: N/A;  . dermoid tumor  2000  . DILITATION & CURRETTAGE/HYSTROSCOPY WITH NOVASURE ABLATION N/A 12/10/2016   Procedure: DILATATION & CURETTAGE/HYSTEROSCOPY WITH NOVASURE ABLATION;  Surgeon: Ena Dawley, MD;  Location: [facility];  Service: Gynecology;  Laterality: N/A;  . ESOPHAGOGASTRODUODENOSCOPY  07/01/2009   DXA:JOINOM esphagus without barrett's/dilation with 16 mm/mild erthyema in the antrum. mild chronic gastritis on path.   Theodoro Kalata FINGER RELEASE  Nov 2016  . TUBAL LIGATION Bilateral 03/06/2016   Procedure: BILATERAL TUBAL LIGATION;  Surgeon: Ena Dawley, MD;  Location: [place];  Service: Obstetrics;  Laterality: Bilateral;    Current Outpatient Prescriptions  Medication Sig Dispense Refill  . acetaminophen (TYLENOL) 500 MG tablet Take 500 mg by mouth every 6 (six) hours as needed for mild pain.    . ARIPiprazole (ABILIFY) 5 MG tablet Take 1 tablet (5 mg total) by mouth daily. 90 tablet 0  . azelastine (ASTELIN) 0.1 % nasal spray Place 2 sprays into both nostrils 2 (two) times daily. Use in each nostril as directed (Patient taking differently: Place 2 sprays into both nostrils daily. Use in each nostril as directed) 30 mL 12  . dexlansoprazole (DEXILANT) 60 MG capsule Take 1 capsule (60 mg total) by mouth daily. 90 capsule 3  . docusate sodium (COLACE) 100 MG capsule Take 100 mg by mouth daily as needed.     . doxycycline (VIBRA-TABS) 100 MG tablet Take 1 tablet (100 mg total) by mouth 2 (two) times daily. 20 tablet 0  . glipiZIDE (GLIPIZIDE XL) 10 MG 24 hr tablet Two tablets every morning at breakfast 180 tablet 1  . glucose blood  test strip Use as instructed, three times daily 300 each 12  . ibuprofen (ADVIL,MOTRIN) 800 MG tablet Take 1 tablet (800 mg total) by mouth every 8 (eight) hours as needed. 50 tablet 1  . Insulin Lispro Prot & Lispro (HUMALOG 75/25 MIX) (75-25) 100 UNIT/ML Kwikpen Inject 15 Units into the skin at bedtime. (Patient taking differently: Inject 18 Units into the skin at bedtime. ) 15 mL 1  . Insulin Pen Needle (PEN NEEDLES 3/16") 31G X 5 MM MISC Use as directed 100 each 0  . loratadine (CLARITIN) 10 MG tablet Take 1 tablet (10 mg total) by mouth daily. Taking generic  otc allergy 90 tablet 1  . losartan (COZAAR) 25 MG tablet TAKE 1 TABLET BY MOUTH  DAILY 90 tablet 0  . metFORMIN (GLUCOPHAGE) 1000 MG tablet Take 1 tablet (1,000 mg total) by mouth 2 (two) times daily with a meal. 180 tablet 3  . ONETOUCH DELICA LANCETS 54O MISC Three times daily testing dx e11.9 300 each 5  . promethazine (PHENERGAN) 12.5 MG tablet Take 1 tablet (12.5 mg total) by mouth every 6 (six) hours as needed for nausea or vomiting. 30 tablet 1  . triamterene-hydrochlorothiazide (MAXZIDE-25) 37.5-25 MG tablet TAKE 1 TABLET BY MOUTH  EVERY DAY 90 tablet 0  . valACYclovir (VALTREX) 500 MG tablet Take 500 mg by mouth as needed.     . vitamin C (ASCORBIC ACID) 500 MG tablet Take 500 mg by mouth daily.     No current facility-administered medications for this visit.     Allergies as of 05/13/2017 - Review Complete 05/13/2017  Allergen Reaction Noted  . Other Anaphylaxis 06/13/2013  . 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 Peanut-containing drug products Anaphylaxis 02/08/2013  . Ace inhibitors Cough 10/10/2010  . Invokana [canagliflozin] Other (See Comments) 04/18/2014  . Augmentin [amoxicillin-pot clavulanate] Nausea And Vomiting 04/07/2013    Family History  Problem Relation Age of Onset  . Diabetes Mother   . Hypertension Mother   . Fibromyalgia Mother   . Diabetes Father   . Hypertension Father   . Asthma Father   . Heart disease Father   . Colon cancer Neg Hx     Social History   Social History  . Marital status: Married    Spouse name: N/A  . Number of children: N/A  . Years of education: N/A   Occupational History  . [employer]    Social History Main Topics  . Smoking status: Never Smoker  . Smokeless tobacco: Never Used     Comment: Never smoked   . Alcohol use 0.0 oz/week     Comment: occ; had 2 glasses of wine last week while on vacation  . Drug use: No  . Sexual activity: Yes    Partners: Male    Birth control/ protection: Rhythm      Comment: Last Intercourse about 10 days ago   Other Topics Concern  . None   Social History Narrative  . None    Review of Systems: Gen: see HPI  CV: Denies chest pain, palpitations, syncope, peripheral edema, and claudication. Resp: Denies dyspnea at rest, cough, wheezing, coughing up blood, and pleurisy. GI: see HPI  Derm: Denies rash, itching, dry skin Psych: Denies depression, anxiety, memory loss, confusion. No homicidal or suicidal ideation.  Heme: Denies bruising, bleeding, and enlarged lymph nodes.  Physical Exam: BP (!) 137/91   Pulse 94   Temp 98.1 F (36.7 C) (Oral)   Ht 5'  4.5" (1.638 m)   Wt 234 lb 9.6 oz (106.4 kg)   BMI 39.65 kg/m  General:   Alert and oriented. No distress noted. Pleasant and cooperative.  Head:  Normocephalic and atraumatic. Eyes:  Conjuctiva clear without scleral icterus. Mouth:  Oral mucosa pink and moist. Good dentition. No lesions. Abdomen:  +BS, soft, non-tender and non-distended. No rebound or guarding. No HSM or masses noted. Msk:  Symmetrical without gross deformities. Normal posture. Extremities:  Without edema. Neurologic:  Alert and  oriented x4 Psych:  Alert and cooperative. Normal mood and affect.   Lab Results  Component Value Date   HGB 13.1 12/12/2016   Lab Results  Component Value Date   FERRITIN 41 03/20/2017

## 2017-05-14 ENCOUNTER — Ambulatory Visit (HOSPITAL_COMMUNITY): Payer: 59 | Admitting: Psychiatry

## 2017-05-15 NOTE — Assessment & Plan Note (Signed)
Doing well with Zofran and Phenergan prn.

## 2017-05-15 NOTE — Assessment & Plan Note (Signed)
Continue Dexilant once daily 

## 2017-05-15 NOTE — Assessment & Plan Note (Signed)
41 year old female with history of intermittent low-volume hematochezia and no prior colonoscopy. Hgb normal but had drop in ferritin several months ago, now improved and back to normal. Question of evolving IDA but now improved. Will proceed with colonoscopy +/- EGD. As of note, she did have new onset dyspepsia at last visit, but this has resolved and doing well on Dexilant. No dysphagia.   Proceed with colonoscopy +/- EGD (due to low ferritin but now improved) with Dr. Oneida Alar in the near future. The risks, benefits, and alternatives have been discussed in detail with the patient. They state understanding and desire to proceed.  PROPOFOL due to polypharmacy

## 2017-05-17 NOTE — Progress Notes (Signed)
cc'ed to pcp °

## 2017-05-20 ENCOUNTER — Other Ambulatory Visit: Payer: Self-pay | Admitting: Family Medicine

## 2017-05-21 ENCOUNTER — Encounter (HOSPITAL_COMMUNITY): Payer: Self-pay | Admitting: Psychiatry

## 2017-05-21 ENCOUNTER — Ambulatory Visit (INDEPENDENT_AMBULATORY_CARE_PROVIDER_SITE_OTHER): Payer: 59 | Admitting: Psychiatry

## 2017-05-21 DIAGNOSIS — F3162 Bipolar disorder, current episode mixed, moderate: Secondary | ICD-10-CM

## 2017-05-21 DIAGNOSIS — R0981 Nasal congestion: Secondary | ICD-10-CM

## 2017-05-21 DIAGNOSIS — Z636 Dependent relative needing care at home: Secondary | ICD-10-CM | POA: Diagnosis not present

## 2017-05-21 MED ORDER — ARIPIPRAZOLE 5 MG PO TABS: 5.0000 mg | ORAL_TABLET | Freq: Every day | ORAL | 0 refills | Status: DC

## 2017-05-21 NOTE — Progress Notes (Signed)
[facility] MD/PA/NP OP Progress Note  05/21/2017 8:23 AM Christina [name]  MRN:  [id number]  Chief Complaint:  I apologized missing appointment.  I was busy taking care of my mother what has stroke.  HPI: Patient came for her follow-up appointment.  She missed last appointment but she's been compliant with Abilify.  She apologized because she was busy taking care of her mother who had a stroke and also blood clot in the brain.  She is only child and she was helping her mother.  Now mother is doing better.  Patient is taking Abilify and denies any irritability, anger, mania or any psychosis.  She denies any hallucination or any paranoia.  She is seeing her primary care physician who recently added insulin because her hemoglobin A1c remains very high.  Patient does not want to change her Abilify.  She admitted noncompliant with diet.  She is taking insulin area she is not happy and she like to stop insulin once blood sugar is stable.  We also recommended to try Latuda but patient does not want to change her Abilify.  Her mood is as stable.  She denies any suicidal thoughts or homicidal thought.  Her energy level is okay.  She is also taking antibiotic for sinusitis.  Her job is going very well.  She is happy that his more than 3 years she is working at the same place.  Patient denies drinking alcohol or using any illegal substances.  She lives with her husband and 3 children.  Her in-laws live close by.  Her own parents also lives close by.  She had a good supportive network.  She has no tremors shakes or any EPS.  Visit Diagnosis:    ICD-10-CM   1. Bipolar 1 disorder, mixed, moderate (HCC) F31.62 ARIPiprazole (ABILIFY) 5 MG tablet    Past Psychiatric History; Reviewed. Patient has been seeing in this office since 2006. She was referred from her primary care physician Dr. Moshe Cipro. She has history of depression and mania and psychosis. In 1997 she was admitted at [facility] because of suicidal gesture. In the past she  has taken Prozac and Wellbutrin. She denies any history of sexual, verbal emotional abuse.   Past Medical History:  Past Medical History:  Diagnosis Date  . Anemia   . Bipolar disorder ([place])   . Depression   . Diabetes mellitus    type 2 DM, insulin only needed during pregnancy  . Gastroparesis   . 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 GERD (gastroesophageal reflux disease)   . H/O acute sinusitis 10/2016  . Hypertension   . Thyroid enlargement   . Wears glasses     Past Surgical History:  Procedure Laterality Date  . BREAST REDUCTION SURGERY  1994  . CESAREAN SECTION N/A 03/06/2016   Procedure: CESAREAN SECTION;  Surgeon: Ena Dawley, MD;  Location: [facility];  Service: Obstetrics;  Laterality: N/A;  . dermoid tumor  2000  . DILITATION & CURRETTAGE/HYSTROSCOPY WITH NOVASURE ABLATION N/A 12/10/2016   Procedure: DILATATION & CURETTAGE/HYSTEROSCOPY WITH NOVASURE ABLATION;  Surgeon: Ena Dawley, MD;  [facility];  Service: Gynecology;  Laterality: N/A;  . ESOPHAGOGASTRODUODENOSCOPY  07/01/2009   EUM:PNTIRW esphagus without barrett's/dilation with 16 mm/mild erthyema in the antrum. mild chronic gastritis on path.   Theodoro Kalata FINGER RELEASE  Nov 2016  . TUBAL LIGATION Bilateral 03/06/2016   Procedure: BILATERAL TUBAL LIGATION;  Surgeon: Ena Dawley, MD;  Location: [facility];  Service: Obstetrics;  Laterality: Bilateral;    Family Psychiatric History; reviewed.  Family History:  Family History  Problem Relation Age of Onset  . Diabetes Mother   . Hypertension Mother   . Fibromyalgia Mother   . Diabetes Father   . Hypertension Father   . Asthma Father   . Heart disease Father   . Colon cancer Neg Hx     Social History:  Social History   Social History  . Marital status: Married    Spouse name: N/A  . Number of children: N/A  . Years of education: N/A   Occupational History  . [employer]     Social History Main Topics  . Smoking status: Never Smoker  . Smokeless tobacco: Never Used     Comment: Never smoked   . Alcohol use 0.0 oz/week     Comment: occ; had 2 glasses of wine last week while on vacation  . Drug use: No  . Sexual activity: Yes    Partners: Male    Birth control/ protection: Rhythm     Comment: Last Intercourse about 10 days ago   Other Topics Concern  . Not on file   Social History Narrative  . No narrative on file    Allergies:  Allergies  Allergen Reactions  . Other Anaphylaxis    Tree nuts  . Peanut Oil Anaphylaxis  . Peanut-Containing Drug Products Anaphylaxis  . Ace Inhibitors Cough    Pt on no contaception and is at reproductive age, discussed with nephrology also  . Invokana [Canagliflozin] Other (See Comments)     Worried about ketoacidosis  . Augmentin [Amoxicillin-Pot Clavulanate] Nausea And Vomiting    Causes stomach cramps. Patient can take amoxicillin, not Augmentin.    Metabolic Disorder Labs: Lab Results  Component Value Date   HGBA1C 8.2 (H) 03/20/2017   MPG 154 06/12/2016   MPG 137 03/16/2016   No results found for: PROLACTIN Lab Results  Component Value Date   CHOL 155 03/16/2016   TRIG 131 03/16/2016   HDL 89 03/16/2016   CHOLHDL 1.7 03/16/2016   VLDL 26 03/16/2016   LDLCALC 40 03/16/2016   LDLCALC 60 02/02/2015   Lab Results  Component Value Date   TSH 0.822 03/20/2017   TSH 0.79 03/16/2016    Therapeutic Level Labs: No results found for: LITHIUM No results found for: VALPROATE No components found for:  CBMZ  Current Medications: Current Outpatient Prescriptions  Medication Sig Dispense Refill  . acetaminophen (TYLENOL) 500 MG tablet Take 500 mg by mouth every 6 (six) hours as needed for mild pain.    . ARIPiprazole (ABILIFY) 5 MG tablet Take 1 tablet (5 mg total) by mouth daily. 90 tablet 0  . azelastine (ASTELIN) 0.1 % nasal spray Place 2 sprays into both nostrils 2 (two) times daily. Use in each  nostril as directed (Patient taking differently: Place 2 sprays into both nostrils daily. Use in each nostril as directed) 30 mL 12  . dexlansoprazole (DEXILANT) 60 MG capsule Take 1 capsule (60 mg total) by mouth daily. 90 capsule 3  . docusate sodium (COLACE) 100 MG capsule Take 100 mg by mouth daily as needed.     . doxycycline (VIBRA-TABS) 100 MG tablet Take 1 tablet (100 mg total) by mouth 2 (two) times daily. 20 tablet 0  . glipiZIDE (GLIPIZIDE XL) 10 MG 24 hr tablet Two tablets every morning at breakfast 180 tablet 1  . glucose blood test strip Use  as instructed, three times daily 300 each 12  . ibuprofen (ADVIL,MOTRIN) 800 MG tablet Take 1 tablet (800 mg total) by mouth every 8 (eight) hours as needed. 50 tablet 1  . Insulin Lispro Prot & Lispro (HUMALOG 75/25 MIX) (75-25) 100 UNIT/ML Kwikpen Inject 15 Units into the skin at bedtime. (Patient taking differently: Inject 18 Units into the skin at bedtime. ) 15 mL 1  . Insulin Pen Needle (PEN NEEDLES 3/16") 31G X 5 MM MISC Use as directed 100 each 0  . loratadine (CLARITIN) 10 MG tablet Take 1 tablet (10 mg total) by mouth daily. Taking generic otc allergy 90 tablet 1  . losartan (COZAAR) 25 MG tablet TAKE 1 TABLET BY MOUTH  DAILY 90 tablet 0  . metFORMIN (GLUCOPHAGE) 1000 MG tablet Take 1 tablet (1,000 mg total) by mouth 2 (two) times daily with a meal. 180 tablet 3  . ONETOUCH DELICA LANCETS 06C MISC Three times daily testing dx e11.9 300 each 5  . promethazine (PHENERGAN) 12.5 MG tablet Take 1 tablet (12.5 mg total) by mouth every 6 (six) hours as needed for nausea or vomiting. 30 tablet 1  . triamterene-hydrochlorothiazide (MAXZIDE-25) 37.5-25 MG tablet TAKE 1 TABLET BY MOUTH  EVERY DAY 90 tablet 0  . valACYclovir (VALTREX) 500 MG tablet Take 500 mg by mouth as needed.     . vitamin C (ASCORBIC ACID) 500 MG tablet Take 500 mg by mouth daily.     No current facility-administered medications for this visit.       Musculoskeletal: Strength & Muscle Tone: within normal limits Gait & Station: normal Patient leans: N/A  Psychiatric Specialty Exam: Review of Systems  Constitutional: Negative.   HENT: Positive for congestion and sinus pain.   Cardiovascular: Negative.   Gastrointestinal: Negative.   Musculoskeletal: Negative.   Skin: Negative.   Neurological: Negative.   Endo/Heme/Allergies: Negative.   Psychiatric/Behavioral: Negative.     Blood pressure 128/78, pulse 85, height 5' 4.5" (1.638 m), weight 234 lb 6.4 oz (106.3 kg), unknown if currently breastfeeding.There is no height or weight on file to calculate BMI.  General Appearance: Casual  Eye Contact:  Good  Speech:  Clear and Coherent  Volume:  Normal  Mood:  Euthymic  Affect:  Congruent  Thought Process:  Coherent  Orientation:  Full (Time, Place, and Person)  Thought Content: Logical   Suicidal Thoughts:  No  Homicidal Thoughts:  No  Memory:  Immediate;   Good Recent;   Good Remote;   Good  Judgement:  Good  Insight:  Good  Psychomotor Activity:  Normal  Concentration:  Concentration: Good and Attention Span: Good  Recall:  Good  Fund of Knowledge: Good  Language: Good  Akathisia:  No  Handed:  Right  AIMS (if indicated): not done  Assets:  Communication Skills [employer] for [place] Talents/Skills Transportation  ADL's:  Intact  Cognition: WNL  Sleep:  Good   Screenings: PHQ2-9     Office Visit from 05/11/2017 in [facility] Office Visit from 10/16/2016 in [facility] Office Visit from 06/01/2016 in [facility] Nutrition [facility]/2015 [facility] Nutrition [facility] 07/10/2015 in [facility]  PHQ-2 Total Score  0  0  0  0  0       Assessment and Plan; Bipolar disorder type I.  Patient doing better on Abilify.  We discussed switching to [facility] but patient reluctant to change  her  medication.  We discuss blood work results.  Her hemoglobin A-1 C still high.  Recently switched to insulin.  I reviewed records from primary care physician including recent blood work results.  We discuss if hemoglobin A1c remains high then we may need to switch to [place].  She agreed with the plan.  She has no tremors shakes or any EPS.  Recommended to call us back if she has any question or any concern.  She does not want a new counseling.  I will see her again in 3 months.  Time spent 25 minutes.   Taline Nass T., MD 05/21/2017, 8:23 AM

## 2017-06-10 ENCOUNTER — Telehealth: Payer: Self-pay | Admitting: *Deleted

## 2017-06-10 ENCOUNTER — Ambulatory Visit (INDEPENDENT_AMBULATORY_CARE_PROVIDER_SITE_OTHER): Payer: 59 | Admitting: Family Medicine

## 2017-06-10 ENCOUNTER — Other Ambulatory Visit: Payer: Self-pay

## 2017-06-10 ENCOUNTER — Encounter: Payer: Self-pay | Admitting: Family Medicine

## 2017-06-10 VITALS — BP 120/78 | HR 107 | Temp 99.9°F | Resp 16 | Ht 64.5 in | Wt 236.0 lb

## 2017-06-10 DIAGNOSIS — J101 Influenza due to other identified influenza virus with other respiratory manifestations: Secondary | ICD-10-CM | POA: Diagnosis not present

## 2017-06-10 MED ORDER — OSELTAMIVIR PHOSPHATE 75 MG PO CAPS: 75.0000 mg | ORAL_CAPSULE | Freq: Two times a day (BID) | ORAL | 0 refills | Status: DC

## 2017-06-10 NOTE — Patient Instructions (Signed)

## 2017-06-10 NOTE — Telephone Encounter (Signed)
Patient called left message stating she may have the flu and she has been having high fever. Please advise [phone].

## 2017-06-10 NOTE — Telephone Encounter (Signed)
Patient is coming in at 1:00 today to see Dr Mannie Stabile.

## 2017-06-10 NOTE — Progress Notes (Signed)
Patient ID: Christina [name], female    DOB: 1975/11/11, 41 y.o.   MRN: [id number]  Chief Complaint  Patient presents with  . Muscle Pain  . Fatigue  . Fever    Allergies Other; Peanut oil; Peanut-containing drug products; Ace inhibitors; Invokana [canagliflozin]; and Augmentin [amoxicillin-pot clavulanate]  Subjective:   Christina [name] is a 41 y.o. female who presents to [facility] today.  HPI Christina [name] presents today for an acute visit.  She reports that she woke up this morning and had 100.3 and then 100.8, immediately took tylenol.  Has last taken Tylenol at 12 p.m, approximately 1 hour ago.  Reports that when she woke up her whole body was hurtong.  Reports that her muscles were sore and her body was very achy.  Since that time has had fevers and chills.  Reports that her son had a flu test today and it was negative.  However he has had fevers of 103 degrees, body ache, sore throat, coughing, and runny nose reports that her husband is called and said that her daughter is having the same symptoms and has a fever to.  Patient reports no history of asthma.  Does not smoke cigarettes.  Has a non-productive cough. Got a flu shot. Energy is low. Has had the flu in the past. No diarrhea.  Denies any abdominal pain, dysuria, neck stiffness.  Vision is fine.  Has a mild headache.  Reports that she just feels terrible and it came on very quickly today.  Reports that this is a very important weekend because her daughter has her big birthday party planned.    Past Medical History:  Diagnosis Date  . Anemia   . Bipolar disorder ([place])   . Depression   . Diabetes mellitus    type 2 DM, insulin only needed during pregnancy  . Gastroparesis   . 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 GERD (gastroesophageal reflux disease)   . H/O acute sinusitis 10/2016  . Hypertension   . Thyroid enlargement   . Wears glasses     Past Surgical History:  Procedure Laterality Date  . BREAST  REDUCTION SURGERY  1994  . dermoid tumor  2000  . ESOPHAGOGASTRODUODENOSCOPY  07/01/2009   ZCH:YIFOYD esphagus without barrett's/dilation with 16 mm/mild erthyema in the antrum. mild chronic gastritis on path.   Theodoro Kalata FINGER RELEASE  Nov 2016    Family History  Problem Relation Age of Onset  . Diabetes Mother   . Hypertension Mother   . Fibromyalgia Mother   . Diabetes Father   . Hypertension Father   . Asthma Father   . Heart disease Father   . Colon cancer Neg Hx      Social History   Socioeconomic History  . Marital status: Married    Spouse name: None  . Number of children: None  . Years of education: None  . Highest education level: None  Social Needs  . Financial resource strain: None  . Food insecurity - worry: None  . Food insecurity - inability: None  . Transportation needs - medical: None  . Transportation needs - non-medical: None  Occupational History  . Occupation: Research officer, [employer]  Tobacco Use  . Smoking status: Never Smoker  . Smokeless tobacco: Never Used  . Tobacco comment: Never smoked   Substance and Sexual Activity  . Alcohol use: Yes    Alcohol/week: 0.0 oz    Comment: occ;  had 2 glasses of wine last week while on vacation  . Drug use: No  . Sexual activity: Yes    Partners: Male    Birth control/protection: Rhythm    Comment: Last Intercourse about 10 days ago  Other Topics Concern  . None  Social History Narrative  . None    Review of Systems  Constitutional: Positive for appetite change, chills, fatigue and fever. Negative for activity change.  HENT: Positive for ear pain. Negative for sinus pressure, sinus pain, sneezing, sore throat, trouble swallowing and voice change.   Eyes: Negative for visual disturbance.  Respiratory: Positive for cough.        Felt some chest tightness earlier but reports she is breathing fine.  Denies any shortness of breath.  Cardiovascular: Negative for chest pain, palpitations and leg  swelling.  Gastrointestinal: Negative for abdominal pain, nausea and vomiting.  Genitourinary: Negative for dysuria, frequency, genital sores and urgency.  Musculoskeletal: Positive for arthralgias and myalgias.  Neurological: Negative for dizziness, tremors, weakness, light-headedness and numbness.  Hematological: Negative for adenopathy. Does not bruise/bleed easily.  Psychiatric/Behavioral: Negative for dysphoric mood.     Objective:   BP 120/78 (BP Location: Left Arm, Patient Position: Sitting, Cuff Size: Normal)   Pulse (!) 107   Temp 99.9 F (37.7 C)   Resp 16   Ht 5' 4.5" (1.638 m)   Wt 236 lb (107 kg)   SpO2 98%   BMI 39.88 kg/m   Physical Exam  Constitutional: She is oriented to person, place, and time. She appears well-nourished.  Patient sitting in exam room obviously feels poorly.  Wearing her mask.  HENT:  Head: Normocephalic and atraumatic.  Right Ear: Tympanic membrane, external ear and ear canal normal. No drainage or swelling. No foreign bodies. Tympanic membrane is not erythematous, not retracted and not bulging. No decreased hearing is noted.  Left Ear: Tympanic membrane, external ear and ear canal normal. No drainage or swelling. No foreign bodies. Tympanic membrane is not erythematous, not retracted and not bulging. No decreased hearing is noted.  Mouth/Throat: Oropharynx is clear and moist.  Turbinate hyperemia and mild edema.  Rhinorrhea clear present.  Eyes: Conjunctivae and EOM are normal. Pupils are equal, round, and reactive to light. Right eye exhibits no discharge. Left eye exhibits no discharge.  Neck: Normal range of motion. Neck supple. No JVD present. No tracheal deviation present. No thyromegaly present.  Cardiovascular: Regular rhythm and normal heart sounds. Tachycardia present.  Pulmonary/Chest: Effort normal and breath sounds normal. No accessory muscle usage or stridor. No tachypnea. No respiratory distress. She has no wheezes.    Musculoskeletal:  Muscles soft no discrete tenderness to palpation.  Lymphadenopathy:    She has no cervical adenopathy.  Neurological: She is alert and oriented to person, place, and time.  Cranial nerves II through XII grossly intact.  Patient mentating normal.  Skin: Skin is warm and dry. She is not diaphoretic.  No rashes noted.     Assessment and Plan  1. Influenza due to seasonal influenza virus Discussed with patient the possibilities of her diagnosis.  Given the fact that multiple people in the same household had very similar symptoms within a small timeframe, suspect patient does have the flu.  We did discuss the fact that her symptoms were very acute in onset and her symptoms were compatible with the flu.  We discussed viral versus bacterial etiologies.  Do believe that patient has the flu at this time.  She would like  to proceed with Tamiflu.  The risks versus benefits of therapy and the need for early initiation of medication were discussed with patient.  She was counseled concerning worrisome signs and symptoms related to the influenza virus.  She was told if she developed any shortness of breath, inability to eat or drink, changes in her mental status or alertness, or if symptoms were not improving during the expected time course, that she should contact our office or go to the emergency department.  She was told that she could use supportive care such as Tylenol or ibuprofen over-the-counter as directed.  She was encouraged to drink plenty of fluids and to rest.  She was given a note for today and for tomorrow for work.  She reports she is off on Monday.  She was told to call with any questions or concerns.  All of her questions were answered today.  Return if symptoms worsen or fail to improve. Caren Macadam, MD 06/10/2017

## 2017-06-14 NOTE — Patient Instructions (Signed)
Christina [name]  06/14/2017     @PREFPERIOPPHARMACY @   Your procedure is scheduled on  06/22/2017   Report to [facility] at  745   A.M.  Call this number if you have problems the morning of surgery:  [phone]   Remember:  Do not eat food or drink liquids after midnight.  Take these medicines the morning of surgery with A SIP OF WATER  Claritin, cozaar, phenergan, maxzide. Take 12 units of your insulin the night before your procedure. DO NOT take any medications for diabetes the morning of your procedure.   Do not wear jewelry, make-up or nail polish.  Do not wear lotions, powders, or perfumes, or deoderant.  Do not shave 48 hours prior to surgery.  Men may shave face and neck.  Do not bring valuables to the hospital.  [facility] is not responsible for any belongings or valuables.  Contacts, dentures or bridgework may not be worn into surgery.  Leave your suitcase in the car.  After surgery it may be brought to your room.  For patients admitted to the hospital, discharge time will be determined by your treatment team.  Patients discharged the day of surgery will not be allowed to drive home.   Name and phone number of your driver:   family Special instructions:  Follow the diet and prep instructions given to you by Dr Nona Dell office.  Please read over the following fact sheets that you were given. Anesthesia Post-op Instructions and Care and Recovery After Surgery       Esophagogastroduodenoscopy Esophagogastroduodenoscopy (EGD) is a procedure to examine the lining of the esophagus, stomach, and first part of the small intestine (duodenum). This procedure is done to check for problems such as inflammation, bleeding, ulcers, or growths. During this procedure, a long, flexible, lighted tube with a camera attached (endoscope) is inserted down the throat. Tell a health care provider about:  Any allergies you have.  All medicines you are taking,  including vitamins, herbs, eye drops, creams, and over-the-counter medicines.  Any problems you or family members have had with anesthetic medicines.  Any blood disorders you have.  Any surgeries you have had.  Any medical conditions you have.  Whether you are pregnant or may be pregnant. What are the risks? Generally, this is a safe procedure. However, problems may occur, including:  Infection.  Bleeding.  A tear (perforation) in the esophagus, stomach, or duodenum.  Trouble breathing.  Excessive sweating.  Spasms of the larynx.  A slowed heartbeat.  Low blood pressure.  What happens before the procedure?  Follow instructions from your health care provider about eating or drinking restrictions.  Ask your health care provider about: ? Changing or stopping your regular medicines. This is especially important if you are taking diabetes medicines or blood thinners. ? Taking medicines such as aspirin and ibuprofen. These medicines can thin your blood. Do not take these medicines before your procedure if your health care provider instructs you not to.  Plan to have someone take you home after the procedure.  If you wear dentures, be ready to remove them before the procedure. What happens during the procedure?  To reduce your risk of infection, your health care team will wash or sanitize their hands.  An IV tube will be put in a vein in your hand or arm. You will get medicines and fluids through this tube.  You will  be given one or more of the following: ? A medicine to help you relax (sedative). ? A medicine to numb the area (local anesthetic). This medicine may be sprayed into your throat. It will make you feel more comfortable and keep you from gagging or coughing during the procedure. ? A medicine for pain.  A mouth guard may be placed in your mouth to protect your teeth and to keep you from biting on the endoscope.  You will be asked to lie on your left  side.  The endoscope will be lowered down your throat into your esophagus, stomach, and duodenum.  Air will be put into the endoscope. This will help your health care provider see better.  The lining of your esophagus, stomach, and duodenum will be examined.  Your health care provider may: ? Take a tissue sample so it can be looked at in a lab (biopsy). ? Remove growths. ? Remove objects (foreign bodies) that are stuck. ? Treat any bleeding with medicines or other devices that stop tissue from bleeding. ? Widen (dilate) or stretch narrowed areas of your esophagus and stomach.  The endoscope will be taken out. The procedure may vary among health care providers and hospitals. What happens after the procedure?  Your blood pressure, heart rate, breathing rate, and blood oxygen level will be monitored often until the medicines you were given have worn off.  Do not eat or drink anything until the numbing medicine has worn off and your gag reflex has returned. This information is not intended to replace advice given to you by your health care provider. Make sure you discuss any questions you have with your health care provider. Document Released: 11/20/2004 Document Revised: 12/26/2015 Document Reviewed: 06/13/2015 Elsevier Interactive Patient Education  2018 [employer]. Esophagogastroduodenoscopy, Care After Refer to this sheet in the next few weeks. These instructions provide you with information about caring for yourself after your procedure. Your health care provider may also give you more specific instructions. Your treatment has been planned according to current medical practices, but problems sometimes occur. Call your health care provider if you have any problems or questions after your procedure. What can I expect after the procedure? After the procedure, it is common to have:  A sore throat.  Nausea.  Bloating.  Dizziness.  Fatigue.  Follow these instructions at  home:  Do not eat or drink anything until the numbing medicine (local anesthetic) has worn off and your gag reflex has returned. You will know that the local anesthetic has worn off when you can swallow comfortably.  Do not drive for 24 hours if you received a medicine to help you relax (sedative).  If your health care provider took a tissue sample for testing during the procedure, make sure to get your test results. This is your responsibility. Ask your health care provider or the department performing the test when your results will be ready.  Keep all follow-up visits as told by your health care provider. This is important. Contact a health care provider if:  You cannot stop coughing.  You are not urinating.  You are urinating less than usual. Get help right away if:  You have trouble swallowing.  You cannot eat or drink.  You have throat or chest pain that gets worse.  You are dizzy or light-headed.  You faint.  You have nausea or vomiting.  You have chills.  You have a fever.  You have severe abdominal pain.  You have black, tarry,  or bloody stools. This information is not intended to replace advice given to you by your health care provider. Make sure you discuss any questions you have with your health care provider. Document Released: 07/06/2012 Document Revised: 12/26/2015 Document Reviewed: 06/13/2015 Elsevier Interactive Patient Education  2018 [employer].  Esophageal Dilatation Esophageal dilatation is a procedure to open a blocked or narrowed part of the esophagus. The esophagus is the long tube in your throat that carries food and liquid from your mouth to your stomach. The procedure is also called esophageal dilation. You may need this procedure if you have a buildup of scar tissue in your esophagus that makes it difficult, painful, or even impossible to swallow. This can be caused by gastroesophageal reflux disease (GERD). In rare cases, people need this  procedure because they have cancer of the esophagus or a problem with the way food moves through the esophagus. Sometimes you may need to have another dilatation to enlarge the opening of the esophagus gradually. Tell a health care provider about:  Any allergies you have.  All medicines you are taking, including vitamins, herbs, eye drops, creams, and over-the-counter medicines.  Any problems you or family members have had with anesthetic medicines.  Any blood disorders you have.  Any surgeries you have had.  Any medical conditions you have.  Any antibiotic medicines you are required to take before dental procedures. What are the risks? Generally, this is a safe procedure. However, problems can occur and include:  Bleeding from a tear in the lining of the esophagus.  A hole (perforation) in the esophagus.  What happens before the procedure?  Do not eat or drink anything after midnight on the night before the procedure or as directed by your health care provider.  Ask your health care provider about changing or stopping your regular medicines. This is especially important if you are taking diabetes medicines or blood thinners.  Plan to have someone take you home after the procedure. What happens during the procedure?  You will be given a medicine that makes you relaxed and sleepy (sedative).  A medicine may be sprayed or gargled to numb the back of the throat.  Your health care provider can use various instruments to do an esophageal dilatation. During the procedure, the instrument used will be placed in your mouth and passed down into your esophagus. Options include: ? Simple dilators. This instrument is carefully placed in the esophagus to stretch it. ? Guided wire bougies. In this method, a flexible tube (endoscope) is used to insert a wire into the esophagus. The dilator is passed over this wire to enlarge the esophagus. Then the wire is removed. ? Balloon dilators. An  endoscope with a small balloon at the end is passed down into the esophagus. Inflating the balloon gently stretches the esophagus and opens it up. What happens after the procedure?  Your blood pressure, heart rate, breathing rate, and blood oxygen level will be monitored often until the medicines you were given have worn off.  Your throat may feel slightly sore and will probably still feel numb. This will improve slowly over time.  You will not be allowed to eat or drink until the throat numbness has resolved.  If this is a same-day procedure, you may be allowed to go home once you have been able to drink, urinate, and sit on the edge of the bed without nausea or dizziness.  If this is a same-day procedure, you should have a friend or family  member with you for the next 24 hours after the procedure. This information is not intended to replace advice given to you by your health care provider. Make sure you discuss any questions you have with your health care provider. Document Released: 09/10/2005 Document Revised: 12/26/2015 Document Reviewed: 11/29/2013 Elsevier Interactive Patient Education  2017 [employer].  Colonoscopy, Adult A colonoscopy is an exam to look at the entire large intestine. During the exam, a lubricated, bendable tube is inserted into the anus and then passed into the rectum, colon, and other parts of the large intestine. A colonoscopy is often done as a part of normal colorectal screening or in response to certain symptoms, such as anemia, persistent diarrhea, abdominal pain, and blood in the stool. The exam can help screen for and diagnose medical problems, including:  Tumors.  Polyps.  Inflammation.  Areas of bleeding.  Tell a health care provider about:  Any allergies you have.  All medicines you are taking, including vitamins, herbs, eye drops, creams, and over-the-counter medicines.  Any problems you or family members have had with anesthetic  medicines.  Any blood disorders you have.  Any surgeries you have had.  Any medical conditions you have.  Any problems you have had passing stool. What are the risks? Generally, this is a safe procedure. However, problems may occur, including:  Bleeding.  A tear in the intestine.  A reaction to medicines given during the exam.  Infection (rare).  What happens before the procedure? Eating and drinking restrictions Follow instructions from your health care provider about eating and drinking, which may include:  A few days before the procedure - follow a low-fiber diet. Avoid nuts, seeds, dried fruit, raw fruits, and vegetables.  1-3 days before the procedure - follow a clear liquid diet. Drink only clear liquids, such as clear broth or bouillon, black coffee or tea, clear juice, clear soft drinks or sports drinks, gelatin dessert, and popsicles. Avoid any liquids that contain red or purple dye.  On the day of the procedure - do not eat or drink anything during the 2 hours before the procedure, or within the time period that your health care provider recommends.  Bowel prep If you were prescribed an oral bowel prep to clean out your colon:  Take it as told by your health care provider. Starting the day before your procedure, you will need to drink a large amount of medicated liquid. The liquid will cause you to have multiple loose stools until your stool is almost clear or light green.  If your skin or anus gets irritated from diarrhea, you may use these to relieve the irritation: ? Medicated wipes, such as adult wet wipes with aloe and vitamin E. ? A skin soothing-product like petroleum jelly.  If you vomit while drinking the bowel prep, take a break for up to 60 minutes and then begin the bowel prep again. If vomiting continues and you cannot take the bowel prep without vomiting, call your health care provider.  General instructions  Ask your health care provider about  changing or stopping your regular medicines. This is especially important if you are taking diabetes medicines or blood thinners.  Plan to have someone take you home from the hospital or clinic. What happens during the procedure?  An IV tube may be inserted into one of your veins.  You will be given medicine to help you relax (sedative).  To reduce your risk of infection: ? Your health care team will wash  or sanitize their hands. ? Your anal area will be washed with soap.  You will be asked to lie on your side with your knees bent.  Your health care provider will lubricate a long, thin, flexible tube. The tube will have a camera and a light on the end.  The tube will be inserted into your anus.  The tube will be gently eased through your rectum and colon.  Air will be delivered into your colon to keep it open. You may feel some pressure or cramping.  The camera will be used to take images during the procedure.  A small tissue sample may be removed from your body to be examined under a microscope (biopsy). If any potential problems are found, the tissue will be sent to a lab for testing.  If small polyps are found, your health care provider may remove them and have them checked for cancer cells.  The tube that was inserted into your anus will be slowly removed. The procedure may vary among health care providers and hospitals. What happens after the procedure?  Your blood pressure, heart rate, breathing rate, and blood oxygen level will be monitored until the medicines you were given have worn off.  Do not drive for 24 hours after the exam.  You may have a small amount of blood in your stool.  You may pass gas and have mild abdominal cramping or bloating due to the air that was used to inflate your colon during the exam.  It is up to you to get the results of your procedure. Ask your health care provider, or the department performing the procedure, when your results will be  ready. This information is not intended to replace advice given to you by your health care provider. Make sure you discuss any questions you have with your health care provider. Document Released: 07/17/2000 Document Revised: 05/20/2016 Document Reviewed: 10/01/2015 Elsevier Interactive Patient Education  2018 [employer].  Colonoscopy, Adult, Care After This sheet gives you information about how to care for yourself after your procedure. Your health care provider may also give you more specific instructions. If you have problems or questions, contact your health care provider. What can I expect after the procedure? After the procedure, it is common to have:  A small amount of blood in your stool for 24 hours after the procedure.  Some gas.  Mild abdominal cramping or bloating.  Follow these instructions at home: General instructions   For the first 24 hours after the procedure: ? Do not drive or use machinery. ? Do not sign important documents. ? Do not drink alcohol. ? Do your regular daily activities at a slower pace than normal. ? Eat soft, easy-to-digest foods. ? Rest often.  Take over-the-counter or prescription medicines only as told by your health care provider.  It is up to you to get the results of your procedure. Ask your health care provider, or the department performing the procedure, when your results will be ready. Relieving cramping and bloating  Try walking around when you have cramps or feel bloated.  Apply heat to your abdomen as told by your health care provider. Use a heat source that your health care provider recommends, such as a moist heat pack or a heating pad. ? Place a towel between your skin and the heat source. ? Leave the heat on for 20-30 minutes. ? Remove the heat if your skin turns bright red. This is especially important if you are unable to  feel pain, heat, or cold. You may have a greater risk of getting burned. Eating and drinking  Drink  enough fluid to keep your urine clear or pale yellow.  Resume your normal diet as instructed by your health care provider. Avoid heavy or fried foods that are hard to digest.  Avoid drinking alcohol for as long as instructed by your health care provider. Contact a health care provider if:  You have blood in your stool 2-3 days after the procedure. Get help right away if:  You have more than a small spotting of blood in your stool.  You pass large blood clots in your stool.  Your abdomen is swollen.  You have nausea or vomiting.  You have a fever.  You have increasing abdominal pain that is not relieved with medicine. This information is not intended to replace advice given to you by your health care provider. Make sure you discuss any questions you have with your health care provider. Document Released: 03/03/2004 Document Revised: 04/13/2016 Document Reviewed: 10/01/2015 Elsevier Interactive Patient Education  2018 [place] Anesthesia is a term that refers to techniques, procedures, and medicines that help a person stay safe and comfortable during a medical procedure. Monitored anesthesia care, or sedation, is one type of anesthesia. Your anesthesia specialist may recommend sedation if you will be having a procedure that does not require you to be unconscious, such as:  Cataract surgery.  A dental procedure.  A biopsy.  A colonoscopy.  During the procedure, you may receive a medicine to help you relax (sedative). There are three levels of sedation:  Mild sedation. At this level, you may feel awake and relaxed. You will be able to follow directions.  Moderate sedation. At this level, you will be sleepy. You may not remember the procedure.  Deep sedation. At this level, you will be asleep. You will not remember the procedure.  The more medicine you are given, the deeper your level of sedation will be. Depending on how you respond to the  procedure, the anesthesia specialist may change your level of sedation or the type of anesthesia to fit your needs. An anesthesia specialist will monitor you closely during the procedure. Let your health care provider know about:  Any allergies you have.  All medicines you are taking, including vitamins, herbs, eye drops, creams, and over-the-counter medicines.  Any use of steroids (by mouth or as a cream).  Any problems you or family members have had with sedatives and anesthetic medicines.  Any blood disorders you have.  Any surgeries you have had.  Any medical conditions you have, such as sleep apnea.  Whether you are pregnant or may be pregnant.  Any use of cigarettes, alcohol, or street drugs. What are the risks? Generally, this is a safe procedure. However, problems may occur, including:  Getting too much medicine (oversedation).  Nausea.  Allergic reaction to medicines.  Trouble breathing. If this happens, a breathing tube may be used to help with breathing. It will be removed when you are awake and breathing on your own.  Heart trouble.  Lung trouble.  Before the procedure Staying hydrated Follow instructions from your health care provider about hydration, which may include:  Up to 2 hours before the procedure - you may continue to drink clear liquids, such as water, clear fruit juice, black coffee, and plain tea.  Eating and drinking restrictions Follow instructions from your health care provider about eating and drinking, which may include:  8 hours before the procedure - stop eating heavy meals or foods such as meat, fried foods, or fatty foods.  6 hours before the procedure - stop eating light meals or foods, such as toast or cereal.  6 hours before the procedure - stop drinking milk or drinks that contain milk.  2 hours before the procedure - stop drinking clear liquids.  Medicines Ask your health care provider about:  Changing or stopping your  regular medicines. This is especially important if you are taking diabetes medicines or blood thinners.  Taking medicines such as aspirin and ibuprofen. These medicines can thin your blood. Do not take these medicines before your procedure if your health care provider instructs you not to.  Tests and exams  You will have a physical exam.  You may have blood tests done to show: ? How well your kidneys and liver are working. ? How well your blood can clot.  General instructions  Plan to have someone take you home from the hospital or clinic.  If you will be going home right after the procedure, plan to have someone with you for 24 hours.  What happens during the procedure?  Your blood pressure, heart rate, breathing, level of pain and overall condition will be monitored.  An IV tube will be inserted into one of your veins.  Your anesthesia specialist will give you medicines as needed to keep you comfortable during the procedure. This may mean changing the level of sedation.  The procedure will be performed. After the procedure  Your blood pressure, heart rate, breathing rate, and blood oxygen level will be monitored until the medicines you were given have worn off.  Do not drive for 24 hours if you received a sedative.  You may: ? Feel sleepy, clumsy, or nauseous. ? Feel forgetful about what happened after the procedure. ? Have a sore throat if you had a breathing tube during the procedure. ? Vomit. This information is not intended to replace advice given to you by your health care provider. Make sure you discuss any questions you have with your health care provider. Document Released: 04/15/2005 Document Revised: 12/27/2015 Document Reviewed: 11/10/2015 Elsevier Interactive Patient Education  [employer].

## 2017-06-15 ENCOUNTER — Telehealth: Payer: Self-pay | Admitting: *Deleted

## 2017-06-15 NOTE — Telephone Encounter (Signed)
Pt informed

## 2017-06-15 NOTE — Telephone Encounter (Signed)
Patient called in stating she was diagnosed with flu on 06/10/17 (not tested but was having flu like symptoms) and rx'd tamiflu. She was scheduled for pre-op 06/16/17 but this was cancelled and was advised to contact us to see if she is still able to have TCS on 06/22/17. Please advise Dr. Oneida Alar thanks

## 2017-06-15 NOTE — Telephone Encounter (Signed)
Patient called stating Dr Mannie Stabile diagnosed her with the flu and wrote her out of work and if she wasn't better to call back. Patient states she is still running a fever over 100, patient would like to know if she needs to come in or will Dr Mannie Stabile extend her note. Please advise

## 2017-06-15 NOTE — Telephone Encounter (Signed)
You may give her a note to be out the next couple days. Please advise her to let us know how she is doing.  Gwen Her. Mannie Stabile, MD

## 2017-06-16 ENCOUNTER — Encounter (HOSPITAL_COMMUNITY): Payer: Self-pay

## 2017-06-16 ENCOUNTER — Encounter (HOSPITAL_COMMUNITY)
Admission: RE | Admit: 2017-06-16 | Discharge: 2017-06-16 | Disposition: A | Discharge status: Home / Self Care | Payer: 59 | Source: Ambulatory Visit | Attending: Gastroenterology | Admitting: Gastroenterology

## 2017-06-16 NOTE — Telephone Encounter (Signed)
PLEASE CALL PT. PT SHOULD CALL TO [place] PREOP AND TCS WHEN SHE FEELS BETTER.

## 2017-06-17 ENCOUNTER — Ambulatory Visit: Payer: Self-pay | Admitting: Podiatry

## 2017-06-17 NOTE — Telephone Encounter (Signed)
Called spoke with pt and procedure cancelled. She will call back to r/s. Called made carolyn aware

## 2017-06-22 ENCOUNTER — Encounter (HOSPITAL_COMMUNITY): Admission: RE | Payer: Self-pay | Source: Ambulatory Visit

## 2017-06-22 ENCOUNTER — Ambulatory Visit (HOSPITAL_COMMUNITY): Admission: RE | Admit: 2017-06-22 | Payer: 59 | Source: Ambulatory Visit | Admitting: Gastroenterology

## 2017-06-22 SURGERY — COLONOSCOPY WITH PROPOFOL
Anesthesia: Monitor Anesthesia Care

## 2017-06-23 ENCOUNTER — Ambulatory Visit: Payer: 59 | Admitting: Family Medicine

## 2017-06-28 ENCOUNTER — Telehealth: Payer: Self-pay | Admitting: Family Medicine

## 2017-06-28 ENCOUNTER — Encounter (HOSPITAL_COMMUNITY): Payer: Self-pay | Admitting: Emergency Medicine

## 2017-06-28 ENCOUNTER — Other Ambulatory Visit: Payer: Self-pay

## 2017-06-28 ENCOUNTER — Emergency Department (HOSPITAL_COMMUNITY)
Admission: EM | Admit: 2017-06-28 | Discharge: 2017-06-28 | Disposition: A | Discharge status: Home / Self Care | Payer: 59 | Attending: Emergency Medicine | Admitting: Emergency Medicine

## 2017-06-28 ENCOUNTER — Emergency Department (HOSPITAL_COMMUNITY): Payer: 59

## 2017-06-28 DIAGNOSIS — R112 Nausea with vomiting, unspecified: Secondary | ICD-10-CM | POA: Diagnosis not present

## 2017-06-28 DIAGNOSIS — D509 Iron deficiency anemia, unspecified: Secondary | ICD-10-CM | POA: Insufficient documentation

## 2017-06-28 DIAGNOSIS — Z791 Long term (current) use of non-steroidal anti-inflammatories (NSAID): Secondary | ICD-10-CM | POA: Diagnosis not present

## 2017-06-28 DIAGNOSIS — Z794 Long term (current) use of insulin: Secondary | ICD-10-CM | POA: Diagnosis not present

## 2017-06-28 DIAGNOSIS — Z7982 Long term (current) use of aspirin: Secondary | ICD-10-CM | POA: Insufficient documentation

## 2017-06-28 DIAGNOSIS — R12 Heartburn: Secondary | ICD-10-CM | POA: Insufficient documentation

## 2017-06-28 DIAGNOSIS — R0981 Nasal congestion: Secondary | ICD-10-CM | POA: Diagnosis not present

## 2017-06-28 DIAGNOSIS — Z79899 Other long term (current) drug therapy: Secondary | ICD-10-CM | POA: Diagnosis not present

## 2017-06-28 DIAGNOSIS — I10 Essential (primary) hypertension: Secondary | ICD-10-CM | POA: Insufficient documentation

## 2017-06-28 DIAGNOSIS — E119 Type 2 diabetes mellitus without complications: Secondary | ICD-10-CM | POA: Insufficient documentation

## 2017-06-28 DIAGNOSIS — D649 Anemia, unspecified: Secondary | ICD-10-CM | POA: Diagnosis not present

## 2017-06-28 DIAGNOSIS — R079 Chest pain, unspecified: Secondary | ICD-10-CM | POA: Diagnosis present

## 2017-06-28 LAB — CBC
HCT: 43 % (ref 36.0–46.0)
Hemoglobin: 13.7 g/dL (ref 12.0–15.0)
MCH: 29.7 pg (ref 26.0–34.0)
MCHC: 31.9 g/dL (ref 30.0–36.0)
MCV: 93.1 fL (ref 78.0–100.0)
Platelets: 342 10*3/uL (ref 150–400)
RBC: 4.62 MIL/uL (ref 3.87–5.11)
RDW: 14.6 % (ref 11.5–15.5)
WBC: 2.7 10*3/uL — ABNORMAL LOW (ref 4.0–10.5)

## 2017-06-28 LAB — BASIC METABOLIC PANEL
Anion gap: 7 (ref 5–15)
BUN: 8 mg/dL (ref 6–20)
CO2: 25 mmol/L (ref 22–32)
Calcium: 8.5 mg/dL — ABNORMAL LOW (ref 8.9–10.3)
Chloride: 99 mmol/L — ABNORMAL LOW (ref 101–111)
Creatinine, Ser: 0.82 mg/dL (ref 0.44–1.00)
GFR calc Af Amer: >60 mL/min (ref >60)
GFR calc non Af Amer: >60 mL/min (ref >60)
Glucose, Bld: 199 mg/dL — ABNORMAL HIGH (ref 65–99)
Potassium: 3.4 mmol/L — ABNORMAL LOW (ref 3.5–5.1)
Sodium: 131 mmol/L — ABNORMAL LOW (ref 135–145)

## 2017-06-28 LAB — TROPONIN I: Troponin I: <0.03 ng/mL (ref <0.03)

## 2017-06-28 NOTE — Discharge Instructions (Signed)
Gradually advance her diet.  Use an antacid such as Maalox, or Tums, as needed for discomfort.  Return here if needed for problems.

## 2017-06-28 NOTE — Telephone Encounter (Signed)
[facility] and feels better and coming in for follow up this week

## 2017-06-28 NOTE — Telephone Encounter (Signed)
Patient is requesting an appt today for nausea, vomiting, and fever of 101.6. She didn't go to work so she says she needs to come in so she can have a note. Cb# [phone]

## 2017-06-28 NOTE — ED Provider Notes (Signed)
[facility] EMERGENCY DEPARTMENT Provider Note   CSN: [id number] Arrival date & time: 06/28/17  1536     History   Chief Complaint Chief Complaint  Patient presents with  . Chest Pain    HPI Christina [name] is a 41 y.o. female.  The patient presents for evaluation of chest pain, low anterior radiating to her back, starting suddenly at 11 AM.  She has been ill for 2 days with a illness consisting of nausea, vomiting and nasal congestion.  She was seen earlier today, [facility] and diagnosed with a viral process.  She was treated symptomatically.  Last emesis was yesterday.  She took her usual medications today including a Phenergan, and Tylenol.  She denies weakness, dizziness, diarrhea, adductive cough, abdominal pain or back pain.  There are no other known modifying factors.   HPI  Past Medical History:  Diagnosis Date  . Anemia   . Bipolar disorder ([place])   . Depression   . Diabetes mellitus    type 2 DM, insulin only needed during pregnancy  . Gastroparesis   . 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 GERD (gastroesophageal reflux disease)   . H/O acute sinusitis 10/2016  . Hypertension   . Thyroid enlargement   . Wears glasses     Patient Active Problem List   Diagnosis Date Noted  . Epigastric pain 03/04/2017  . Bipolar 1 disorder, mixed, moderate ([place]) 06/17/2016  . Diabetes mellitus type 2 in obese ([place]) 03/16/2016  . Bipolar 1 disorder ([place]) 11/19/2014  . Rectal bleeding 03/30/2014  . Fatty liver 10/21/2013  . Seasonal allergies 09/26/2013  . Essential hypertension, benign 08/09/2013  . Iron deficiency anemia 05/15/2013  . Family history of blood disorder 02/28/2013  . 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 GASTROPARESIS 09/02/2009  . GERD 01/27/2009  . Morbid obesity ([place]) 01/05/2008    Past Surgical History:  Procedure Laterality Date  . BREAST REDUCTION SURGERY  1994  . CESAREAN SECTION  N/A 03/06/2016   Procedure: CESAREAN SECTION;  Surgeon: Ena Dawley, MD;  Location: [place];  Service: Obstetrics;  Laterality: N/A;  . dermoid tumor  2000  . DILITATION & CURRETTAGE/HYSTROSCOPY WITH NOVASURE ABLATION N/A 12/10/2016   Procedure: DILATATION & CURETTAGE/HYSTEROSCOPY WITH NOVASURE ABLATION;  Surgeon: Ena Dawley, MD;  Location: [facility];  Service: Gynecology;  Laterality: N/A;  . ESOPHAGOGASTRODUODENOSCOPY  07/01/2009   XVQ:MGQQPY esphagus without barrett's/dilation with 16 mm/mild erthyema in the antrum. mild chronic gastritis on path.   Theodoro Kalata FINGER RELEASE  Nov 2016  . TUBAL LIGATION Bilateral 03/06/2016   Procedure: BILATERAL TUBAL LIGATION;  Surgeon: Ena Dawley, MD;  Location: [place];  Service: Obstetrics;  Laterality: Bilateral;    OB History    Gravida Para Term Preterm AB Living   4 3 2 1   3    SAB TAB Ectopic Multiple Live Births         0 3       Home Medications    Prior to Admission medications   Medication Sig Start Date End Date Taking? Authorizing Provider  acetaminophen (TYLENOL) 500 MG tablet Take 1,000 mg every 8 (eight) hours as needed by mouth for mild pain.    Yes [provider]  ARIPiprazole (ABILIFY) 5 MG tablet Take 1 tablet (5 mg total) by mouth daily. Patient taking differently: Take 5 mg every evening by mouth.  05/21/17  Yes Arfeen, Arlyce Harman, MD  aspirin EC 81 MG tablet Take 81 mg by mouth once.   Yes [provider]  azelastine (ASTELIN) 0.1 % nasal spray Place 2 sprays into both nostrils 2 (two) times daily. Use in each nostril as directed Patient taking differently: Place 2 sprays daily as needed into both nostrils for rhinitis or allergies. Use in each nostril as directed 10/19/16  Yes [name], MD  Ca Carbonate-Mag Hydroxide (ROLAIDS) 550-110 MG CHEW Chew 1-2 tablets by mouth once.   Yes [provider]  Coenzyme Q10 (CO Q 10 PO) Take 1 tablet daily  by mouth.   Yes [provider]  dexlansoprazole (DEXILANT) 60 MG capsule Take 1 capsule (60 mg total) by mouth daily. Patient taking differently: Take 60 mg every evening by mouth.  03/12/17  Yes Annitta Needs, NP  glipiZIDE (GLIPIZIDE XL) 10 MG 24 hr tablet Two tablets every morning at breakfast Patient taking differently: Take 20 mg by mouth daily with breakfast. Two tablets every morning at breakfast 03/23/17  Yes [name], MD  ibuprofen (ADVIL,MOTRIN) 800 MG tablet Take 1 tablet (800 mg total) by mouth every 8 (eight) hours as needed. Patient taking differently: Take 800 mg every 8 (eight) hours as needed by mouth for moderate pain.  12/10/16  Yes Ena Dawley, MD  Insulin Lispro Prot & Lispro (HUMALOG 75/25 MIX) (75-25) 100 UNIT/ML Kwikpen Inject 15 Units into the skin at bedtime. Patient taking differently: Inject 18 Units into the skin at bedtime.  04/14/17  Yes [name], MD  loratadine (CLARITIN) 10 MG tablet Take 1 tablet (10 mg total) by mouth daily. Taking generic otc allergy 07/10/16  Yes [name], MD  losartan (COZAAR) 25 MG tablet TAKE 1 TABLET BY MOUTH  DAILY 03/29/17  Yes [name], MD  metFORMIN (GLUCOPHAGE) 1000 MG tablet Take 1 tablet (1,000 mg total) by mouth 2 (two) times daily with a meal. 04/06/17  Yes [name], MD  promethazine (PHENERGAN) 12.5 MG tablet Take 1 tablet (12.5 mg total) by mouth every 6 (six) hours as needed for nausea or vomiting. 03/04/17  Yes Annitta Needs, NP  triamterene-hydrochlorothiazide (MAXZIDE-25) 37.5-25 MG tablet TAKE 1 TABLET BY MOUTH  EVERY DAY 03/29/17  Yes [name], MD  vitamin C (ASCORBIC ACID) 500 MG tablet Take 500 mg by mouth daily.   Yes [provider]  cefdinir (OMNICEF) 300 MG capsule Take 300 mg by mouth 2 (two) times daily. 10 day course starting on 06/18/2017 06/18/17   [provider]  doxycycline (VIBRA-TABS) 100 MG tablet Take 1 tablet (100 mg  total) by mouth 2 (two) times daily. Patient not taking: Reported on 06/28/2017 05/11/17   Caren Macadam, MD  glucose blood test strip Use as instructed, three times daily 04/08/17   [name], MD  Insulin Pen Needle (PEN NEEDLES 3/16") 31G X 5 MM MISC Use as directed 04/15/17   [name], MD  [facility] DELICA LANCETS 16R MISC Three times daily testing dx e11.9 04/08/17   [name], MD  oseltamivir (TAMIFLU) 75 MG capsule Take 1 capsule (75 mg total) 2 (two) times daily by mouth. Patient not taking: Reported on 06/28/2017 06/10/17   Caren Macadam, MD  valACYclovir (VALTREX) 500 MG tablet Take 1,000 mg as needed by mouth (for 3 days if needed for out breaks).  06/29/16   [provider]    Family History Family History  Problem Relation Age of Onset  . Diabetes Mother   . Hypertension Mother   . Fibromyalgia Mother   . Diabetes Father   . Hypertension Father   . Asthma Father   . Heart disease Father   . Colon cancer Neg Hx     Social History Social History   Tobacco Use  . Smoking status: Never Smoker  . Smokeless tobacco: Never Used  . Tobacco comment: Never smoked   Substance Use Topics  . Alcohol use: Yes    Alcohol/week: 0.0 oz    Comment: occ; had 2 glasses of wine last week while on vacation  . Drug use: No     Allergies   Other; Peanut oil; Peanut-containing drug products; Ace inhibitors; Invokana [canagliflozin]; and Augmentin [amoxicillin-pot clavulanate]   Review of Systems Review of Systems  All other systems reviewed and are negative.    Physical Exam Updated Vital Signs BP 121/81 (BP Location: Right Arm)   Pulse 99   Temp 98.5 F (36.9 C) (Oral)   Resp 17   Ht 5' 4.5" (1.638 m)   Wt 107 kg (236 lb)   SpO2 98%   BMI 39.88 kg/m   Physical Exam  Constitutional: She is oriented to person, place, and time. She appears well-developed and well-nourished. She does not appear ill.  HENT:  Head: Normocephalic and  atraumatic.  Eyes: Conjunctivae and EOM are normal. Pupils are equal, round, and reactive to light.  Neck: Normal range of motion and phonation normal. Neck supple.  Cardiovascular: Normal rate and regular rhythm.  Pulmonary/Chest: Effort normal and breath sounds normal. She exhibits no tenderness.  Abdominal: Soft. She exhibits no distension. There is no tenderness. There is no guarding.  Musculoskeletal: Normal range of motion.  Neurological: She is alert and oriented to person, place, and time. She exhibits normal muscle tone.  Skin: Skin is warm and dry.  Psychiatric: She has a normal mood and affect. Her behavior is normal. Judgment and thought content normal.  Nursing note and vitals reviewed.    ED Treatments / Results  Labs (all labs ordered are listed, but only abnormal results are displayed) Labs Reviewed  BASIC METABOLIC PANEL - Abnormal; Notable for the following components:      Result Value   Sodium 131 (*)    Potassium 3.4 (*)    Chloride 99 (*)    Glucose, Bld 199 (*)    Calcium 8.5 (*)    All other components within normal limits  CBC - Abnormal; Notable for the following components:   WBC 2.7 (*)    All other components within normal limits  TROPONIN I    EKG  EKG Interpretation  Date/Time:  Monday June 28 2017 15:42:07 EST Ventricular Rate:  114 PR Interval:  142 QRS Duration: 76 QT Interval:  320 QTC Calculation: 441 R Axis:   33 Text Interpretation:  Sinus tachycardia Possible Anterior infarct , age undetermined Abnormal ECG No old tracing to compare Confirmed by Daleen Bo [phone]) on 06/28/2017 4:14:50 PM       Radiology Dg Chest 2 View  Result Date: 06/28/2017 CLINICAL DATA:  Chest pain EXAM: CHEST  2 VIEW COMPARISON:  06/22/2016 FINDINGS: The heart size and mediastinal contours are within normal limits. Both lungs are clear. The visualized skeletal structures are unremarkable. IMPRESSION: No active cardiopulmonary disease.  Electronically Signed   By: Kerby Moors M.D.   On: 06/28/2017 16:19    Procedures Procedures (including critical care time)  Medications  Ordered in ED Medications - No data to display   Initial Impression / Assessment and Plan / ED Course  I have reviewed the triage vital signs and the nursing notes.  Pertinent labs & imaging results that were available during my care of the patient were reviewed by me and considered in my medical decision making (see chart for details).  Clinical Course as of Jun 28 1921  Mon Jun 28, 2017  1921 Normal Troponin I: <0.03 [EW]  1921 Slightly low Sodium: (!) 131 [EW]  1921 Slightly low Potassium: (!) 3.4 [EW]  1921 Elevated, patient is diabetic Glucose: (!) 199 [EW]  1922 Normal Anion gap: 7 [EW]    Clinical Course User Index [EW] Daleen Bo, MD     Patient Vitals for the past 24 hrs:  BP Temp Temp src Pulse Resp SpO2 Height Weight  06/28/17 1915 121/81 98.5 F (36.9 C) Oral 99 17 98 % - -  06/28/17 1545 140/81 98.5 F (36.9 C) Oral (!) 107 18 98 % - -  06/28/17 1538 - - - - - - 5' 4.5" (1.638 m) 107 kg (236 lb)    7:20 PM Reevaluation with update and discussion. After initial assessment and treatment, an updated evaluation reveals no change in clinical status.  Findings discussed with patient and her husband, all questions were answered. Daleen Bo     Final Clinical Impressions(s) / ED Diagnoses   Final diagnoses:  Heartburn  Nausea and vomiting, intractability of vomiting not specified, unspecified vomiting type   Viral illness, with heartburn.  Doubt ACS, PE or pneumonia. Doubt serious bacterial infection or impending vascular collapse.  Nursing Notes Reviewed/ Care Coordinated Applicable Imaging Reviewed Interpretation of Laboratory Data incorporated into ED treatment  The patient appears reasonably screened and/or stabilized for discharge and I doubt any other medical condition or other [facility] requiring further  screening, evaluation, or treatment in the ED at this time prior to discharge.  Plan: Home Medications-antacid for heartburn, continue routine medications and symptomatic treatment with over-the-counter medications; Home Treatments-gradually advance diet; return here if the recommended treatment, does not improve the symptoms; Recommended follow up-PCP, as needed  ED Discharge Orders    None       Daleen Bo, MD 06/28/17 1925

## 2017-06-28 NOTE — ED Triage Notes (Signed)
PT c/o pain to center chest that radiates up into her right jaw x2 hours ago unrelieved by Rolaids and ginger ale. PT [facility] and dx with a viral infection and states she just got done taking an antibiotic to treat a sinus infection.

## 2017-06-29 ENCOUNTER — Telehealth: Payer: Self-pay | Admitting: *Deleted

## 2017-06-29 LAB — BMP8+EGFR
BUN/Creatinine Ratio: 12 (ref 9–23)
BUN: 11 mg/dL (ref 6–24)
CO2: 21 mmol/L (ref 20–29)
Calcium: 8.5 mg/dL — ABNORMAL LOW (ref 8.7–10.2)
Chloride: 102 mmol/L (ref 96–106)
Creatinine, Ser: 0.91 mg/dL (ref 0.57–1.00)
GFR calc Af Amer: 91 mL/min/{1.73_m2} (ref >59)
GFR calc non Af Amer: 79 mL/min/{1.73_m2} (ref >59)
Glucose: 267 mg/dL — ABNORMAL HIGH (ref 65–99)
Potassium: 4 mmol/L (ref 3.5–5.2)
Sodium: 138 mmol/L (ref 134–144)

## 2017-06-29 LAB — HEMOGLOBIN A1C
Est. average glucose Bld gHb Est-mCnc: 200 mg/dL
Hgb A1c MFr Bld: 8.6 % — ABNORMAL HIGH (ref 4.8–5.6)

## 2017-06-29 NOTE — Telephone Encounter (Signed)
Routing to Doris 

## 2017-06-29 NOTE — Telephone Encounter (Signed)
OK TO TRIAGE FOR ENDOSCOPY.

## 2017-06-29 NOTE — Telephone Encounter (Signed)
Patient called and LMOVM that she is ready to schedule her TCS/EGD W/ PROPOFOL. Previously cancelled 06/22/17 d/t being diagnosed with the flu. Does she need another OV or can she be triaged by Tamela Oddi? Please advise Dr. Oneida Alar, Thanks

## 2017-06-30 ENCOUNTER — Ambulatory Visit (INDEPENDENT_AMBULATORY_CARE_PROVIDER_SITE_OTHER): Payer: 59 | Admitting: Family Medicine

## 2017-06-30 ENCOUNTER — Encounter: Payer: Self-pay | Admitting: Family Medicine

## 2017-06-30 VITALS — BP 120/82 | HR 100 | Temp 98.7°F | Resp 16 | Ht 68.0 in | Wt 236.0 lb

## 2017-06-30 DIAGNOSIS — Z09 Encounter for follow-up examination after completed treatment for conditions other than malignant neoplasm: Secondary | ICD-10-CM | POA: Diagnosis not present

## 2017-06-30 DIAGNOSIS — I10 Essential (primary) hypertension: Secondary | ICD-10-CM

## 2017-06-30 DIAGNOSIS — E669 Obesity, unspecified: Secondary | ICD-10-CM | POA: Diagnosis not present

## 2017-06-30 DIAGNOSIS — K3184 Gastroparesis: Secondary | ICD-10-CM | POA: Diagnosis not present

## 2017-06-30 DIAGNOSIS — K219 Gastro-esophageal reflux disease without esophagitis: Secondary | ICD-10-CM

## 2017-06-30 DIAGNOSIS — E1169 Type 2 diabetes mellitus with other specified complication: Secondary | ICD-10-CM | POA: Diagnosis not present

## 2017-06-30 NOTE — Patient Instructions (Signed)
F/u in 3.5 months, call if you need me before  Fasting lipid, cmp and eGFR , and HBa1C 1 week before next visit  Keep up exercise at least 6 days per week    Goal for fasting blood sugar ranges from 80 to 120 and 2 hours after any meal or at bedtime should be between 130 to 170.  Test and record fasting and bedtime   Please work on good  health habits so that your health will improve. 1. Commitment to daily physical activity for 30 to 60  minutes, if you are able to do this.  2. Commitment to wise food choices. Aim for half of your  food intake to be vegetable and fruit, one quarter starchy foods, and one quarter protein. Try to eat on a regular schedule  3 meals per day, snacking between meals should be limited to vegetables or fruits or small portions of nuts. 64 ounces of water per day is generally recommended, unless you have specific health conditions, like heart failure or kidney failure where you will need to limit fluid intake.  3. Commitment to sufficient and a  good quality of physical and mental rest daily, generally between 6 to 8 hours per day.  WITH PERSISTANCE AND PERSEVERANCE, THE IMPOSSIBLE , BECOMES THE NORM!   Thank you  for choosing [facility]. We consider it a privelige to serve you.  Delivering excellent health care in a caring and  compassionate way is our goal.  Partnering with you,  so that together we can achieve this goal is our strategy.

## 2017-07-01 ENCOUNTER — Encounter: Payer: Self-pay | Admitting: *Deleted

## 2017-07-01 ENCOUNTER — Encounter: Payer: Self-pay | Admitting: Family Medicine

## 2017-07-01 ENCOUNTER — Other Ambulatory Visit: Payer: Self-pay | Admitting: Family Medicine

## 2017-07-01 DIAGNOSIS — K219 Gastro-esophageal reflux disease without esophagitis: Secondary | ICD-10-CM

## 2017-07-01 DIAGNOSIS — K625 Hemorrhage of anus and rectum: Secondary | ICD-10-CM

## 2017-07-01 NOTE — Telephone Encounter (Signed)
LMOVM

## 2017-07-01 NOTE — Telephone Encounter (Signed)
Patient called back and procedure is scheduled for 07/20/17 at 10:45am. Patient aware of instructions for prep and new instructions have been mailed to her. She still has her clenpiq Rx from last time. Patient aware I will call her back with her preop appt. She also states she needs a letter stating what date her procedure is scheduled for to give to her employer. Wants this mailed to her as well.

## 2017-07-01 NOTE — Telephone Encounter (Signed)
PA for TCS/+/-EGD was approved. Reference # [id number]

## 2017-07-01 NOTE — Telephone Encounter (Signed)
LMOVM. Preop appt scheduled for 07/13/17 at 8:00am. Letter mailed to patient

## 2017-07-01 NOTE — Telephone Encounter (Signed)
Per CM, OK to review meds and schedule. Thanks!

## 2017-07-02 ENCOUNTER — Other Ambulatory Visit: Payer: Self-pay | Admitting: Family Medicine

## 2017-07-02 MED ORDER — EPINEPHRINE 0.3 MG/0.3ML IJ SOAJ: 0.3000 mg | Freq: Once | INTRAMUSCULAR | 1 refills | Status: AC

## 2017-07-02 NOTE — Telephone Encounter (Signed)
Seen 11 28 18 

## 2017-07-05 ENCOUNTER — Telehealth: Payer: Self-pay | Admitting: *Deleted

## 2017-07-05 NOTE — Telephone Encounter (Signed)
Patient called stating she has a second sinus infection since Saturday. Please advise [phone]

## 2017-07-05 NOTE — Telephone Encounter (Signed)
Was seen on 11/28. Since Saturday, yellow sinus drainage, sore scratchy throat, fever of 100 this am. Pt states she is at work. Please advise

## 2017-07-05 NOTE — Telephone Encounter (Signed)
Needs clinicalevalaution

## 2017-07-06 ENCOUNTER — Ambulatory Visit (HOSPITAL_COMMUNITY)
Admission: RE | Admit: 2017-07-06 | Discharge: 2017-07-06 | Disposition: A | Discharge status: Home / Self Care | Payer: 59 | Source: Ambulatory Visit | Attending: Family Medicine | Admitting: Family Medicine

## 2017-07-06 ENCOUNTER — Ambulatory Visit: Payer: 59 | Admitting: Family Medicine

## 2017-07-06 ENCOUNTER — Encounter: Payer: Self-pay | Admitting: Family Medicine

## 2017-07-06 VITALS — BP 118/84 | HR 85 | Temp 98.6°F | Resp 16 | Ht 68.0 in | Wt 238.0 lb

## 2017-07-06 DIAGNOSIS — J029 Acute pharyngitis, unspecified: Secondary | ICD-10-CM | POA: Diagnosis not present

## 2017-07-06 DIAGNOSIS — E669 Obesity, unspecified: Secondary | ICD-10-CM

## 2017-07-06 DIAGNOSIS — E1169 Type 2 diabetes mellitus with other specified complication: Secondary | ICD-10-CM | POA: Diagnosis not present

## 2017-07-06 DIAGNOSIS — J0100 Acute maxillary sinusitis, unspecified: Secondary | ICD-10-CM

## 2017-07-06 DIAGNOSIS — H6692 Otitis media, unspecified, left ear: Secondary | ICD-10-CM | POA: Diagnosis not present

## 2017-07-06 LAB — POCT RAPID STREP A (OFFICE): Rapid Strep A Screen: NEGATIVE

## 2017-07-06 MED ORDER — FLUCONAZOLE 150 MG PO TABS: ORAL_TABLET | ORAL | 0 refills | Status: DC

## 2017-07-06 MED ORDER — CLINDAMYCIN HCL 300 MG PO CAPS: 300.0000 mg | ORAL_CAPSULE | Freq: Three times a day (TID) | ORAL | 0 refills | Status: DC

## 2017-07-06 NOTE — Progress Notes (Signed)
   Christina [name]     MRN: [id number]      DOB: 12-Mar-1976   HPI Ms. Mccrory is here with a h/o sore throat  3 days ago, then 2. Fever  Up to 100 2 days ago, bilateral ear pain  days ago she developed left maxillary pressure and green bloody nasal drainage from left maxillary sinus Bilateral ear pain, left more than right and tender left neck glands Completed 10 day course of ceftin last Monday for sinus infection  ROS  Denies chest congestion, productive cough or wheezing. Denies chest pains, palpitations and leg swelling Denies abdominal pain, nausea, vomiting,diarrhea or constipation.   Denies dysuria, frequency, hesitancy or incontinence. Denies joint pain, swelling and limitation in mobility. Denies headaches, seizures, numbness, or tingling. Denies depression, anxiety or insomnia. Denies skin break down or rash.   PE  BP 118/84   Pulse 85   Temp 98.6 F (37 C) (Oral)   Resp 16   Ht 5\' 8"  (1.727 m)   Wt 238 lb (108 kg)   SpO2 96%   BMI 36.19 kg/m   Patient alert and oriented and in no cardiopulmonary distress.  HEENT: No facial asymmetry, EOMI,   oropharynx pink and moist. Tonsils enlarged and erythematous, no exudate noted  tender left cervical adenopathy. Neck supple no JVD,  .Left maxillary sinus tender, and left Tm erythematous  Chest: Clear to auscultation bilaterally.  CVS: S1, S2 no murmurs, no S3.Regular rate.  ABD: Soft non tender.   Ext: No edema  MS: Adequate ROM spine, shoulders, hips and knees.  Skin: Intact, no ulcerations or rash noted.  Psych: Good eye contact, normal affect. Memory intact not anxious or depressed appearing.  CNS: CN 2-12 intact, power,  normal throughout.no focal deficits noted.   Assessment & Plan  Acute maxillary sinusitis 10 day course of cleocin prescribed and X ray of sinus ordered also culture of nasal drainage Work excuse x 1 day  Left otitis media 10 day antibiotic course prescribed  Diabetes mellitus type 2 in  obese ([place]) Uncontrolled Needs to be diligent in testing and control of diet with regular exercise Repeat lab in 3 months  Sore throat Rapid strep test negative, exam in office negative for exudate

## 2017-07-06 NOTE — Telephone Encounter (Signed)
Seen in office today  

## 2017-07-06 NOTE — Patient Instructions (Addendum)
F/u as before  Sinus X ray when you leave  Send nasal drainage for culture when you leave , give [place] specimen here , we will send to [place] sent in for 10 days and also fluconazole   Work excuse for today return in am  Netty pot flushes twice daily

## 2017-07-07 ENCOUNTER — Encounter: Payer: Self-pay | Admitting: Family Medicine

## 2017-07-08 ENCOUNTER — Encounter: Payer: Self-pay | Admitting: Family Medicine

## 2017-07-08 LAB — CMP14+EGFR
ALT: 14 IU/L (ref 0–32)
AST: 12 IU/L (ref 0–40)
Albumin/Globulin Ratio: 1.8 (ref 1.2–2.2)
Albumin: 4.3 g/dL (ref 3.5–5.5)
Alkaline Phosphatase: 63 IU/L (ref 39–117)
BUN/Creatinine Ratio: 10 (ref 9–23)
BUN: 8 mg/dL (ref 6–24)
Bilirubin Total: <0.2 mg/dL (ref 0.0–1.2)
CO2: 25 mmol/L (ref 20–29)
Calcium: 9.3 mg/dL (ref 8.7–10.2)
Chloride: 103 mmol/L (ref 96–106)
Creatinine, Ser: 0.78 mg/dL (ref 0.57–1.00)
GFR calc Af Amer: 109 mL/min/{1.73_m2} (ref >59)
GFR calc non Af Amer: 95 mL/min/{1.73_m2} (ref >59)
Globulin, Total: 2.4 g/dL (ref 1.5–4.5)
Glucose: 228 mg/dL — ABNORMAL HIGH (ref 65–99)
Potassium: 4.6 mmol/L (ref 3.5–5.2)
Sodium: 141 mmol/L (ref 134–144)
Total Protein: 6.7 g/dL (ref 6.0–8.5)

## 2017-07-08 LAB — HEMOGLOBIN A1C
Est. average glucose Bld gHb Est-mCnc: 194 mg/dL
Hgb A1c MFr Bld: 8.4 % — ABNORMAL HIGH (ref 4.8–5.6)

## 2017-07-08 LAB — LIPID PANEL
Chol/HDL Ratio: 2.5 ratio (ref 0.0–4.4)
Cholesterol, Total: 125 mg/dL (ref 100–199)
HDL: 51 mg/dL (ref >39)
LDL Calculated: 48 mg/dL (ref 0–99)
Triglycerides: 132 mg/dL (ref 0–149)
VLDL Cholesterol Cal: 26 mg/dL (ref 5–40)

## 2017-07-09 ENCOUNTER — Telehealth: Payer: Self-pay | Admitting: Family Medicine

## 2017-07-09 NOTE — Telephone Encounter (Signed)
Left message to collect

## 2017-07-09 NOTE — Telephone Encounter (Signed)
pls give hger the work note

## 2017-07-09 NOTE — Telephone Encounter (Signed)
Will forward to Dr Moshe Cipro for approval

## 2017-07-09 NOTE — Telephone Encounter (Signed)
Patient left message on nurse line. She states that she has been communicating via My Chart about an inner ear problem. She took the OTC meclizine as directed and slept for most of the day. She is asking for a work note for yesterday. Please advise.    Callback# [phone]

## 2017-07-11 ENCOUNTER — Encounter: Payer: Self-pay | Admitting: Family Medicine

## 2017-07-11 DIAGNOSIS — H6692 Otitis media, unspecified, left ear: Secondary | ICD-10-CM

## 2017-07-11 DIAGNOSIS — J029 Acute pharyngitis, unspecified: Secondary | ICD-10-CM | POA: Insufficient documentation

## 2017-07-11 DIAGNOSIS — Z09 Encounter for follow-up examination after completed treatment for conditions other than malignant neoplasm: Secondary | ICD-10-CM | POA: Insufficient documentation

## 2017-07-11 DIAGNOSIS — H6693 Otitis media, unspecified, bilateral: Secondary | ICD-10-CM | POA: Insufficient documentation

## 2017-07-11 NOTE — Assessment & Plan Note (Signed)
Controlled, no change in medication DASH diet and commitment to daily physical activity for a minimum of 30 minutes discussed and encouraged, as a part of hypertension management. The importance of attaining a healthy weight is also discussed.  BP/Weight 07/06/2017 06/30/2017 06/28/2017 06/10/2017 05/13/2017 05/11/2017 02/08/6282  Systolic BP 662 947 654 650 354 656 812  Diastolic BP 84 82 81 78 91 76 80  Wt. (Lbs) 238 236 236 236 234.6 233.08 228  BMI 36.19 35.88 39.88 39.88 39.65 38.79 37.94  Some encounter information is confidential and restricted. Go to Review Flowsheets activity to see all data.

## 2017-07-11 NOTE — Progress Notes (Signed)
Christina [name]     MRN: [id number]      DOB: November 20, 1975   HPI Christina [name] is here for follow up from an UC / [facility] visit on 11/26 when she was diagnosed with viral pharyngitis, and then later that day, was in the ED because of  Low anterior chest pain, radiating to her back, reporting a 2 day h/o nausea , vomit and nasal congestion an d diarrhea, also states she had fever as recently as 1 day ago ROS See HPI  . Denies sinus pressure, nasal congestion, ear pain or sore throat. Denies chest congestion, productive cough or wheezing. Denies PND, orthopnea , palpitations and leg swelling    Denies dysuria, frequency, hesitancy or incontinence. Denies joint pain, swelling and limitation in mobility. Denies headaches, seizures, numbness, or tingling. Denies  Uncontrolled epression, anxiety or insomnia. Denies skin break down or rash.   PE  BP 120/82   Pulse 100   Temp 98.7 F (37.1 C) (Oral)   Resp 16   Ht 5\' 8"  (1.727 m)   Wt 236 lb (107 kg)   SpO2 98%   BMI 35.88 kg/m   Patient alert and oriented and in no cardiopulmonary distress.  HEENT: No facial asymmetry, EOMI,   oropharynx pink and moist.  Neck supple no JVD, no mass.  Chest: Clear to auscultation bilaterally.No reproducible chest wall tenderness  CVS: S1, S2 no murmurs, no S3.Regular rate.  ABD: Soft mild generalized tenderness, no guarding or rebound, increased bowel sounds  Ext: No edema  MS: Adequate ROM spine, shoulders, hips and knees.  Skin: Intact, no ulcerations or rash noted.  Psych: Good eye contact, normal affect. Memory intact not anxious or depressed appearing.  CNS: CN 2-12 intact, power,  normal throughout.no focal deficits noted.   Assessment & Plan  Essential hypertension, benign Controlled, no change in medication DASH diet and commitment to daily physical activity for a minimum of 30 minutes discussed and encouraged, as a part of hypertension management. The importance of attaining  a healthy weight is also discussed.  BP/Weight 07/06/2017 06/30/2017 06/28/2017 06/10/2017 05/13/2017 05/11/2017 01/28/3150  Systolic BP 761 607 371 [phone] 627  Diastolic BP 84 82 81 78 91 76 80  Wt. (Lbs) 238 236 236 236 234.6 233.08 228  BMI 36.19 35.88 39.88 39.88 39.65 38.79 37.94  Some encounter information is confidential and restricted. Go to Review Flowsheets activity to see all data.       Diabetes mellitus type 2 in obese [facility]) Christina [name] is reminded of the importance of commitment to daily physical activity for 30 minutes or more, as able and the need to limit carbohydrate intake to 30 to 60 grams per meal to help with blood sugar control.   The need to take medication as prescribed, test blood sugar as directed, and to call between visits if there is a concern that blood sugar is uncontrolled is also discussed.   Christina [name] is reminded of the importance of daily foot exam, annual eye examination, and good blood sugar, blood pressure and cholesterol control. Deteriorated, attributes uncontrolled sugar t study drug she was recently taking, no Dose adjustment made , needs to commit to diet and exercise  Diabetic Labs Latest Ref Rng & Units 07/07/2017 06/28/2017 06/28/2017 03/20/2017 03/04/2017  HbA1c 4.8 - 5.6 % 8.4(H) - 8.6(H) 8.2(H) -  Microalbumin Not Estab. ug/mL - - - - -  Micro/Creat Ratio 0.0 - 30.0 mg/g creat - - - - -  Chol 100 - 199 mg/dL 125 - - - -  HDL >39 mg/dL 51 - - - -  Calc LDL 0 - 99 mg/dL 48 - - - -  Triglycerides 0 - 149 mg/dL 132 - - - -  Creatinine 0.57 - 1.00 mg/dL 0.78 0.82 0.91 0.96 0.87   BP/Weight 07/06/2017 06/30/2017 06/28/2017 06/10/2017 05/13/2017 05/11/2017 03/12/1750  Systolic BP 025 852 778 242 353 614 431  Diastolic BP 84 82 81 78 91 76 80  Wt. (Lbs) 238 236 236 236 234.6 233.08 228  BMI 36.19 35.88 39.88 39.88 39.65 38.79 37.94  Some encounter information is confidential and restricted. Go to Review Flowsheets activity to see all data.    Foot/eye exam completion dates Latest Ref Rng & Units 12/16/2016 03/16/2016  Eye Exam No Retinopathy No Retinopathy -  Foot Form Completion - - Done        GERD Uncontrolled symptoms based on record review from recent ED  Visit, will need to follow up with GI  GASTROPARESIS Contributing to GI symptoms, with improved blood sugar control this will improve, she is motivated to making this a reality  Morbid obesity Deteriorated. Patient re-educated about  the importance of commitment to a  minimum of 150 minutes of exercise per week.  The importance of healthy food choices with portion control discussed. Encouraged to start a food diary, count calories and to consider  joining a support group. Sample diet sheets offered. Goals set by the patient for the next several months.   Weight /BMI 07/06/2017 06/30/2017 06/28/2017  WEIGHT 238 lb 236 lb 236 lb  HEIGHT 5\' 8"  5\' 8"  5' 4.5"  BMI 36.19 kg/m2 35.88 kg/m2 39.88 kg/m2  Some encounter information is confidential and restricted. Go to Review Flowsheets activity to see all data.      Encounter for examination following treatment at hospital Evaluated 2 days prior on 11/26 at [facility], and in the ED with 2 separate diagnoses of viral pharyngitis and chest pain attributed to GERD. At visit , she denies any persistent symptoms of either complaint

## 2017-07-11 NOTE — Assessment & Plan Note (Signed)
10 day antibiotic course prescribed 

## 2017-07-11 NOTE — Assessment & Plan Note (Signed)
Deteriorated. Patient re-educated about  the importance of commitment to a  minimum of 150 minutes of exercise per week.  The importance of healthy food choices with portion control discussed. Encouraged to start a food diary, count calories and to consider  joining a support group. Sample diet sheets offered. Goals set by the patient for the next several months.   Weight /BMI 07/06/2017 06/30/2017 06/28/2017  WEIGHT 238 lb 236 lb 236 lb  HEIGHT 5\' 8"  5\' 8"  5' 4.5"  BMI 36.19 kg/m2 35.88 kg/m2 39.88 kg/m2  Some encounter information is confidential and restricted. Go to Review Flowsheets activity to see all data.

## 2017-07-11 NOTE — Assessment & Plan Note (Signed)
Uncontrolled symptoms based on record review from recent ED  Visit, will need to follow up with GI

## 2017-07-11 NOTE — Assessment & Plan Note (Signed)
Contributing to GI symptoms, with improved blood sugar control this will improve, she is motivated to making this a reality

## 2017-07-11 NOTE — Assessment & Plan Note (Addendum)
Evaluated 2 days prior on 11/26 at [facility], and in the ED with 2 separate diagnoses of viral pharyngitis and chest pain attributed to GERD. At visit , she denies any persistent symptoms of either complaint

## 2017-07-11 NOTE — Assessment & Plan Note (Addendum)
Christina [name] is reminded of the importance of commitment to daily physical activity for 30 minutes or more, as able and the need to limit carbohydrate intake to 30 to 60 grams per meal to help with blood sugar control.   The need to take medication as prescribed, test blood sugar as directed, and to call between visits if there is a concern that blood sugar is uncontrolled is also discussed.   Christina [name] is reminded of the importance of daily foot exam, annual eye examination, and good blood sugar, blood pressure and cholesterol control. Deteriorated, attributes uncontrolled sugar t study drug she was recently taking, no Dose adjustment made , needs to commit to diet and exercise  Diabetic Labs Latest Ref Rng & Units 07/07/2017 06/28/2017 06/28/2017 03/20/2017 03/04/2017  HbA1c 4.8 - 5.6 % 8.4(H) - 8.6(H) 8.2(H) -  Microalbumin Not Estab. ug/mL - - - - -  Micro/Creat Ratio 0.0 - 30.0 mg/g creat - - - - -  Chol 100 - 199 mg/dL 125 - - - -  HDL >39 mg/dL 51 - - - -  Calc LDL 0 - 99 mg/dL 48 - - - -  Triglycerides 0 - 149 mg/dL 132 - - - -  Creatinine 0.57 - 1.00 mg/dL 0.78 0.82 0.91 0.96 0.87   BP/Weight 07/06/2017 06/30/2017 06/28/2017 06/10/2017 05/13/2017 05/11/2017 04/22/1659  Systolic BP 600 459 977 414 239 532 023  Diastolic BP 84 82 81 78 91 76 80  Wt. (Lbs) 238 236 236 236 234.6 233.08 228  BMI 36.19 35.88 39.88 39.88 39.65 38.79 37.94  Some encounter information is confidential and restricted. Go to Review Flowsheets activity to see all data.   Foot/eye exam completion dates Latest Ref Rng & Units 12/16/2016 03/16/2016  Eye Exam No Retinopathy No Retinopathy -  Foot Form Completion - - Done

## 2017-07-11 NOTE — Assessment & Plan Note (Signed)
10 day course of cleocin prescribed and X ray of sinus ordered also culture of nasal drainage Work excuse x 1 day

## 2017-07-11 NOTE — Assessment & Plan Note (Signed)
Uncontrolled Needs to be diligent in testing and control of diet with regular exercise Repeat lab in 3 months

## 2017-07-11 NOTE — Assessment & Plan Note (Signed)
Rapid strep test negative, exam in office negative for exudate

## 2017-07-12 ENCOUNTER — Encounter (HOSPITAL_COMMUNITY): Payer: Self-pay

## 2017-07-13 ENCOUNTER — Encounter (HOSPITAL_COMMUNITY)
Admission: RE | Admit: 2017-07-13 | Discharge: 2017-07-13 | Disposition: A | Discharge status: Home / Self Care | Payer: 59 | Source: Ambulatory Visit | Attending: Gastroenterology | Admitting: Gastroenterology

## 2017-07-13 ENCOUNTER — Encounter: Payer: Self-pay | Admitting: Family Medicine

## 2017-07-14 ENCOUNTER — Telehealth: Payer: Self-pay

## 2017-07-14 LAB — SPECIMEN STATUS REPORT

## 2017-07-14 NOTE — Telephone Encounter (Signed)
Patient has cancelled x 2. FYI to Dr. Oneida Alar.

## 2017-07-14 NOTE — Telephone Encounter (Signed)
As of note, both cancellations were due to personal sickness (first time flu, second time sinus infection). They were not "no-shows".

## 2017-07-14 NOTE — Telephone Encounter (Signed)
Pt called and said she has a really bad sinus infection and PCP told her definitely not to do procedures now. She will call back in the spring/summer to reschedule. I have taken off of the schedule and called Hoyle Sauer to cancel in Endo. FYI to [employer] who scheduled her.

## 2017-07-18 ENCOUNTER — Other Ambulatory Visit: Payer: Self-pay | Admitting: Family Medicine

## 2017-07-18 DIAGNOSIS — J329 Chronic sinusitis, unspecified: Secondary | ICD-10-CM

## 2017-07-18 LAB — SUSCEPTIBILITY, AER + ANAEROB

## 2017-07-18 LAB — SPECIMEN STATUS REPORT

## 2017-07-18 MED ORDER — FLUCONAZOLE 150 MG PO TABS: ORAL_TABLET | ORAL | 0 refills | Status: DC

## 2017-07-18 MED ORDER — LEVOFLOXACIN 500 MG PO TABS: 500.0000 mg | ORAL_TABLET | Freq: Every day | ORAL | 0 refills | Status: DC

## 2017-07-19 ENCOUNTER — Telehealth: Payer: Self-pay | Admitting: Family Medicine

## 2017-07-19 NOTE — Telephone Encounter (Signed)
41 yo Christina [name].

## 2017-07-19 NOTE — Telephone Encounter (Signed)
Spoke directly with pt re update regarding attempt to get CT sinus , still in a determination states after a 40 day hold today in tthe office , And her need to commit to the entire course of levaquin, she agrees and understands

## 2017-07-20 ENCOUNTER — Ambulatory Visit (HOSPITAL_COMMUNITY): Admission: RE | Admit: 2017-07-20 | Payer: 59 | Source: Ambulatory Visit | Admitting: Gastroenterology

## 2017-07-20 ENCOUNTER — Encounter (HOSPITAL_COMMUNITY): Admission: RE | Payer: Self-pay | Source: Ambulatory Visit

## 2017-07-20 SURGERY — COLONOSCOPY WITH PROPOFOL
Anesthesia: Monitor Anesthesia Care

## 2017-07-20 NOTE — Telephone Encounter (Signed)
WE UNDERSTAND SHE HAD TO CANCEL DUE TO ILLNESS. HOWEVER BECAUSE SHE HAS HAD 2 CANCELLATIONS. SHE NEEDS AN OPV PRIOR TO RESCHEDULING FOR THE THIRD TIME.

## 2017-07-21 NOTE — Telephone Encounter (Signed)
Noted. Will wait for pt to call back.

## 2017-07-22 ENCOUNTER — Ambulatory Visit: Payer: Self-pay | Admitting: Podiatry

## 2017-07-24 LAB — UPPER RESPIRATORY CULTURE, ROUTINE

## 2017-08-06 ENCOUNTER — Ambulatory Visit (HOSPITAL_COMMUNITY)
Admission: RE | Admit: 2017-08-06 | Discharge: 2017-08-06 | Disposition: A | Discharge status: Home / Self Care | Payer: 59 | Source: Ambulatory Visit | Attending: Family Medicine | Admitting: Family Medicine

## 2017-08-06 DIAGNOSIS — J3489 Other specified disorders of nose and nasal sinuses: Secondary | ICD-10-CM | POA: Diagnosis present

## 2017-08-06 DIAGNOSIS — R509 Fever, unspecified: Secondary | ICD-10-CM | POA: Diagnosis not present

## 2017-08-06 DIAGNOSIS — J342 Deviated nasal septum: Secondary | ICD-10-CM | POA: Diagnosis not present

## 2017-08-06 DIAGNOSIS — J329 Chronic sinusitis, unspecified: Secondary | ICD-10-CM

## 2017-08-06 MED ORDER — IOPAMIDOL (ISOVUE-300) INJECTION 61%
75.0000 mL | Freq: Once | INTRAVENOUS | Status: AC | PRN
  Administered 2017-08-06: 75 mL via INTRAVENOUS

## 2017-08-08 ENCOUNTER — Encounter: Payer: Self-pay | Admitting: Family Medicine

## 2017-08-12 ENCOUNTER — Ambulatory Visit: Payer: 59 | Admitting: Podiatry

## 2017-08-12 ENCOUNTER — Encounter: Payer: Self-pay | Admitting: Podiatry

## 2017-08-12 DIAGNOSIS — Z794 Long term (current) use of insulin: Secondary | ICD-10-CM | POA: Diagnosis not present

## 2017-08-12 DIAGNOSIS — M2041 Other hammer toe(s) (acquired), right foot: Secondary | ICD-10-CM

## 2017-08-12 DIAGNOSIS — M2042 Other hammer toe(s) (acquired), left foot: Secondary | ICD-10-CM

## 2017-08-12 DIAGNOSIS — E088 Diabetes mellitus due to underlying condition with unspecified complications: Secondary | ICD-10-CM

## 2017-08-13 ENCOUNTER — Other Ambulatory Visit: Payer: Self-pay | Admitting: Family Medicine

## 2017-08-13 DIAGNOSIS — E1169 Type 2 diabetes mellitus with other specified complication: Secondary | ICD-10-CM

## 2017-08-13 DIAGNOSIS — E669 Obesity, unspecified: Secondary | ICD-10-CM

## 2017-08-13 DIAGNOSIS — I10 Essential (primary) hypertension: Secondary | ICD-10-CM

## 2017-08-14 NOTE — Progress Notes (Signed)
She presents today for her annual diabetic checkup she states that overall she is doing very well she is a little discerns that her hemoglobin A1c has been elevated slightly due to a study that she was in.  She had the placebo which ultimately elevated her blood sugar.  She denies any changes in her neurologic status.  She states that she has gained some weight but has no podiatric issues.  Objective: Vital signs are stable she is alert and oriented x3 skin is supple well-hydrated and without lesions or openings.  Nails are of normal length color texture skin is of note normal turgor.  No orthopedic abnormalities.  She has great range of motion of all of her digits and joints.  Muscle strength is normal symmetrical bilateral deep tendon reflexes normal symmetrical bilateral there is no loss of protective sensation per Semmes Weinstein monofilament.  Assessment: Well diabetic foot check.  Plan: Follow-up with her annually.

## 2017-08-17 ENCOUNTER — Other Ambulatory Visit: Payer: Self-pay

## 2017-08-17 ENCOUNTER — Encounter: Payer: Self-pay | Admitting: Family Medicine

## 2017-08-17 ENCOUNTER — Ambulatory Visit: Payer: 59 | Admitting: Family Medicine

## 2017-08-17 VITALS — BP 132/86 | HR 84 | Temp 98.7°F | Resp 18 | Ht 68.0 in | Wt 240.0 lb

## 2017-08-17 DIAGNOSIS — J029 Acute pharyngitis, unspecified: Secondary | ICD-10-CM

## 2017-08-17 DIAGNOSIS — B9789 Other viral agents as the cause of diseases classified elsewhere: Secondary | ICD-10-CM

## 2017-08-17 DIAGNOSIS — J028 Acute pharyngitis due to other specified organisms: Principal | ICD-10-CM

## 2017-08-17 NOTE — Patient Instructions (Signed)
Rest Push fluids Take OTC cold and sore throat medicine Call if not better in 7 days

## 2017-08-17 NOTE — Progress Notes (Signed)
Chief Complaint  Patient presents with  . Sore Throat    x 1 day   Last night had some body aches Today has sore throat, ear itching and temp of 100.6. No headache or sinus congestion. 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 No coughing, chest congestion, sputum, or shortness of breath Appetite diminished, is able to eat and drink water.  No nausea vomiting diarrhea Chart is reviewed.  She took 3 weeks of Levaquin in December for sinus infection.  She had normal sinus CAT scan 10 days ago.   Patient Active Problem List   Diagnosis Date Noted  . Left otitis media 07/11/2017  . Sore throat 07/11/2017  . Epigastric pain 03/04/2017  . Bipolar 1 disorder, mixed, moderate ([place]) 06/17/2016  . Diabetes mellitus type 2 in obese ([place]) 03/16/2016  . Acute maxillary sinusitis 05/03/2015  . Bipolar 1 disorder ([place]) 11/19/2014  . Rectal bleeding 03/30/2014  . Fatty liver 10/21/2013  . Seasonal allergies 09/26/2013  . Essential hypertension, benign 08/09/2013  . Iron deficiency anemia 05/15/2013  . Family history of blood disorder 02/28/2013  . 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 GASTROPARESIS 09/02/2009  . GERD 01/27/2009  . Morbid obesity ([place]) 01/05/2008    Outpatient Encounter Medications as of 08/17/2017  Medication Sig  . acetaminophen (TYLENOL) 500 MG tablet Take 1,000 mg every 8 (eight) hours as needed by mouth for mild pain.   . ARIPiprazole (ABILIFY) 5 MG tablet Take 1 tablet (5 mg total) by mouth daily. (Patient taking differently: Take 5 mg every evening by mouth. )  . azelastine (ASTELIN) 0.1 % nasal spray Place 2 sprays into both nostrils 2 (two) times daily. Use in each nostril as directed (Patient taking differently: Place 2 sprays daily as needed into both nostrils for rhinitis or  allergies. Use in each nostril as directed)  . B-D UF III MINI PEN NEEDLES 31G X 5 MM MISC USE AS DIRECTED  . Ca Carbonate-Mag Hydroxide (ROLAIDS) 550-110 MG CHEW Chew 1-2 tablets by mouth once.  . Coenzyme Q10 (CO Q 10 PO) Take 1 tablet daily by mouth.  . dexlansoprazole (DEXILANT) 60 MG capsule Take 1 capsule (60 mg total) by mouth daily. (Patient taking differently: Take 60 mg every evening by mouth. )  . glipiZIDE (GLUCOTROL XL) 10 MG 24 hr tablet TAKE 2 TABLETS BY MOUTH  EVERY MORNING AT BREAKFAT  . glucose blood test strip Use as instructed, three times daily  . Insulin Lispro Prot & Lispro (HUMALOG 75/25 MIX) (75-25) 100 UNIT/ML Kwikpen Inject 15 Units into the skin at bedtime. (Patient taking differently: Inject 18 Units into the skin at bedtime. )  . loratadine (CLARITIN) 10 MG tablet Take 1 tablet (10 mg total) by mouth daily. Taking generic otc allergy  . losartan (COZAAR) 25 MG tablet TAKE 1 TABLET BY MOUTH  DAILY  . metFORMIN (GLUCOPHAGE) 1000 MG tablet Take 1 tablet (1,000 mg total) by mouth 2 (two) times daily with a meal.  . ONETOUCH DELICA LANCETS 27O MISC Three times daily testing dx e11.9  . triamterene-hydrochlorothiazide (MAXZIDE-25) 37.5-25 MG tablet TAKE 1 TABLET BY MOUTH  EVERY DAY  . valACYclovir (VALTREX) 500 MG tablet Take 1,000 mg as needed by mouth (for 3 days if needed for out breaks).   . vitamin C (ASCORBIC  ACID) 500 MG tablet Take 500 mg by mouth daily.  . [DISCONTINUED] levofloxacin (LEVAQUIN) 500 MG tablet Take 1 tablet (500 mg total) by mouth daily.  . fluconazole (DIFLUCAN) 150 MG tablet One tablet twice weekly , as needed, for vaginal itch associated with prolonged antibiotic use (Patient not taking: Reported on 08/17/2017)  . [DISCONTINUED] clindamycin (CLEOCIN) 300 MG capsule Take 1 capsule (300 mg total) by mouth 3 (three) times daily. (Patient not taking: Reported on 08/17/2017)   No facility-administered encounter medications on file as of 08/17/2017.      Allergies  Allergen Reactions  . Other Anaphylaxis    Tree nuts  . Peanut Oil Anaphylaxis  . Peanut-Containing Drug Products Anaphylaxis  . Ace Inhibitors Cough    Pt on no contaception and is at reproductive age, discussed with nephrology also  . Invokana [Canagliflozin] Other (See Comments)     Worried about ketoacidosis  . Augmentin [Amoxicillin-Pot Clavulanate] Nausea And Vomiting    Causes stomach cramps. Patient can take amoxicillin, not Augmentin.    Review of Systems  Constitutional: Positive for appetite change, fatigue and fever. Negative for chills.  HENT: Positive for sore throat. Negative for congestion, ear pain, postnasal drip and rhinorrhea.   Eyes: Negative for redness and visual disturbance.  Respiratory: Negative for cough and shortness of breath.   Gastrointestinal: Negative for diarrhea, nausea and vomiting.  Neurological: Negative for headaches.  All other systems reviewed and are negative.   BP 132/86 (BP Location: Left Arm, Patient Position: Sitting, Cuff Size: Large)   Pulse 84   Temp 98.7 F (37.1 C) (Oral)   Resp 18   Ht 5\' 8"  (1.727 m)   Wt 240 lb 0.6 oz (108.9 kg)   SpO2 98%   BMI 36.50 kg/m   Physical Exam  Constitutional: She is oriented to person, place, and time. She appears well-developed and well-nourished. She does not appear ill.  HENT:  Head: Normocephalic and atraumatic.  Right Ear: Hearing, tympanic membrane and ear canal normal.  Left Ear: Hearing, tympanic membrane and ear canal normal.  Mouth/Throat: Uvula is midline and mucous membranes are normal. Posterior oropharyngeal erythema present. Tonsils are 0 on the right. Tonsils are 0 on the left. No tonsillar exudate.  Eyes: EOM are normal. Pupils are equal, round, and reactive to light.  Neck: Normal range of motion.  Cardiovascular: Normal rate, regular rhythm and normal heart sounds.  Pulmonary/Chest: Effort normal and breath sounds normal. She has no wheezes.   Lymphadenopathy:    She has no cervical adenopathy.  Neurological: She is alert and oriented to person, place, and time.  Skin: Skin is warm.  Psychiatric: She has a normal mood and affect. Her behavior is normal.   STREP NEGATIVE  ASSESSMENT/PLAN:  1. Sore throat (viral) DISCUSSED NEED TO AVOID ADDITIONAL ANTIBIOTICS   Patient Instructions  Rest Push fluids Take OTC cold and sore throat medicine Call if not better in 7 days     Raylene Everts, MD

## 2017-08-18 ENCOUNTER — Encounter: Payer: Self-pay | Admitting: Family Medicine

## 2017-08-23 ENCOUNTER — Ambulatory Visit (HOSPITAL_COMMUNITY): Payer: 59 | Admitting: Psychiatry

## 2017-08-23 ENCOUNTER — Encounter: Payer: Self-pay | Admitting: Family Medicine

## 2017-08-26 ENCOUNTER — Telehealth: Payer: Self-pay | Admitting: Family Medicine

## 2017-08-26 ENCOUNTER — Other Ambulatory Visit: Payer: Self-pay | Admitting: Family Medicine

## 2017-08-26 NOTE — Telephone Encounter (Signed)
States she is running a temp of 100. Neon yellow productive cough, headache, finished levequin 2 weeks ago. Please advise

## 2017-08-26 NOTE — Telephone Encounter (Signed)
Advised urgent care or to call at 8 am tomorrow for a same day slot

## 2017-08-26 NOTE — Telephone Encounter (Signed)
Patient is running fever, states whatever she has is not resolving   Cb#: [phone]

## 2017-08-26 NOTE — Telephone Encounter (Signed)
When she saw Dr Meda Coffee was advised to call back if no better Does Dr Lynelle Doctor avai;lability in the next several days

## 2017-08-27 ENCOUNTER — Ambulatory Visit (INDEPENDENT_AMBULATORY_CARE_PROVIDER_SITE_OTHER): Payer: 59 | Admitting: Family Medicine

## 2017-08-27 ENCOUNTER — Encounter: Payer: Self-pay | Admitting: Family Medicine

## 2017-08-27 ENCOUNTER — Other Ambulatory Visit: Payer: Self-pay

## 2017-08-27 VITALS — BP 130/88 | HR 101 | Temp 99.1°F | Resp 16 | Ht 64.5 in | Wt 236.5 lb

## 2017-08-27 DIAGNOSIS — J40 Bronchitis, not specified as acute or chronic: Secondary | ICD-10-CM | POA: Diagnosis not present

## 2017-08-27 MED ORDER — AZITHROMYCIN 250 MG PO TABS: ORAL_TABLET | ORAL | 0 refills | Status: DC

## 2017-08-27 MED ORDER — BENZONATATE 100 MG PO CAPS: 100.0000 mg | ORAL_CAPSULE | Freq: Three times a day (TID) | ORAL | 0 refills | Status: DC | PRN

## 2017-08-27 NOTE — Progress Notes (Signed)
Patient ID: Christina [name], female    DOB: 10-23-1975, 42 y.o.   MRN: [id number]  Chief Complaint  Patient presents with  . Cough  . Nasal Congestion    Allergies Other; Peanut oil; Peanut-containing drug products; Ace inhibitors; Invokana [canagliflozin]; and Augmentin [amoxicillin-pot clavulanate]  Subjective:   Christina [name] is a 42 y.o. female who presents to [facility] today.  HPI Here for an acute visit.  She reports that she was seen on 08/17/17 for sore throat and upper respiratory symptoms.  She has continued to cough since then. Coughing to the point of emesis.  Reports that her symptoms have not gotten better.  For the past several days as even been coughing up yellow 30-looking sputum.  She reports that initially her sputum was whitish to yellow now it is changed in color.  She also reports that she has been having some low-grade fevers.  She reports that she has been sweaty and overall feels worse over the past several days.  She reports this is been going on for over 10 days and felt like she needed to seek medical care.  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  Does not have any shortness of breath or chest pain.  Denies body aches or runny nose.  Her main issue is her cough.  She has no history of asthma or hospitalization secondary to her breathing.  Had a sinus infection over a month ago and was treated with antibiotics.  Denies any sinus pain or pressure at this time.  No pain in her teeth.  Denies any ear pain.  No hemoptysis present.  Nothing seems to make the cough better or worse.  Has been using Delsym over-the-counter.  Reports that she coughs despite using the medication.   Cough  This is a new problem. Episode onset: 10 days. The problem has been gradually worsening. The cough is productive of purulent sputum (dark, "dirty" yellow. ). Associated symptoms include chills and postnasal drip. Pertinent negatives include  no chest pain, ear congestion, headaches, rash, sore throat, shortness of breath or wheezing. Nothing aggravates the symptoms. She has tried OTC cough suppressant and rest for the symptoms. The treatment provided mild relief. Her past medical history is significant for bronchitis. There is no history of asthma or pneumonia.    Past Medical History:  Diagnosis Date  . Anemia   . Bipolar disorder ([place])   . Depression   . Diabetes mellitus    type 2 DM, insulin only needed during pregnancy  . Gastroparesis   . 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 GERD (gastroesophageal reflux disease)   . H/O acute sinusitis 10/2016  . Hypertension   . Thyroid enlargement   . Wears glasses     Past Surgical History:  Procedure Laterality Date  . BREAST REDUCTION SURGERY  1994  . CESAREAN SECTION N/A 03/06/2016   Procedure: CESAREAN SECTION;  Surgeon: Ena Dawley, MD;  Location: [place];  Service: Obstetrics;  Laterality: N/A;  . dermoid tumor  2000  . DILITATION & CURRETTAGE/HYSTROSCOPY WITH NOVASURE ABLATION N/A 12/10/2016   Procedure: DILATATION & CURETTAGE/HYSTEROSCOPY WITH NOVASURE ABLATION;  Surgeon: Ena Dawley, MD;  Location: [facility];  Service: Gynecology;  Laterality: N/A;  . ESOPHAGOGASTRODUODENOSCOPY  07/01/2009   YDX:AJOINO esphagus without barrett's/dilation with 16 mm/mild erthyema in the antrum. mild chronic gastritis on path.   Theodoro Kalata FINGER RELEASE  Nov 2016  . TUBAL LIGATION Bilateral 03/06/2016   Procedure: BILATERAL TUBAL LIGATION;  Surgeon: Ena Dawley, MD;  Location: [place];  Service: Obstetrics;  Laterality: Bilateral;    Family History  Problem Relation Age of Onset  . Diabetes Mother   . Hypertension Mother   . Fibromyalgia Mother   . Diabetes Father   . Hypertension Father   . Asthma Father   . Heart disease Father   . Colon cancer Neg Hx      Social History   Socioeconomic History  . Marital status: Married     Spouse name: None  . Number of children: None  . Years of education: None  . Highest education level: None  Social Needs  . Financial resource strain: None  . Food insecurity - worry: None  . Food insecurity - inability: None  . Transportation needs - medical: None  . Transportation needs - non-medical: None  Occupational History  . 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 Smoking status: Never Smoker  . Smokeless tobacco: Never Used  . Tobacco comment: Never smoked   Substance and Sexual Activity  . Alcohol use: Yes    Alcohol/week: 0.0 oz    Comment: occ; had 2 glasses of wine last week while on vacation  . Drug use: No  . Sexual activity: Yes    Partners: Male    Birth control/protection: Rhythm    Comment: Last Intercourse about 10 days ago  Other Topics Concern  . None  Social History Narrative  . None    Review of Systems  Constitutional: Positive for chills.  HENT: Positive for postnasal drip. Negative for mouth sores, sinus pressure, sinus pain, sore throat, trouble swallowing and voice change.   Eyes: Negative for visual disturbance.  Respiratory: Positive for cough. Negative for shortness of breath and wheezing.   Cardiovascular: Negative for chest pain.  Gastrointestinal: Negative for abdominal pain, diarrhea and nausea.  Genitourinary: Negative for decreased urine volume, frequency and hematuria.  Musculoskeletal: Negative for back pain.  Skin: Negative for rash.  Neurological: Negative for dizziness and headaches.  Hematological: Negative for adenopathy.     Objective:   BP 130/88 (BP Location: Left Arm, Patient Position: Sitting, Cuff Size: Normal)   Pulse (!) 101   Temp 99.1 F (37.3 C) (Temporal)   Resp 16   Ht 5' 4.5" (1.638 m)   Wt 236 lb 8 oz (107.3 kg)   SpO2 95%   BMI 39.97 kg/m   Physical Exam  Constitutional: She is oriented to person, place, and time. She appears well-developed and well-nourished.  HENT:  Head:  Normocephalic and atraumatic.  Mouth/Throat: Oropharynx is clear and moist.  Neck: Normal range of motion. Neck supple.  Tender submandibular glands bilaterally.  Cardiovascular: Normal rate and regular rhythm.  Pulmonary/Chest: Breath sounds normal. No respiratory distress. She has no wheezes.  Neurological: She is alert and oriented to person, place, and time. She has normal strength.  Skin: Skin is warm and dry. No rash noted.  Vitals reviewed.    Assessment and Plan   1. Bronchitis Supportive care encouraged.  Tylenol over-the-counter as directed. - azithromycin (ZITHROMAX) 250 MG tablet; Two pills today and then one pill each day for the next four days.  Dispense: 6 tablet; Refill: 0 - benzonatate (TESSALON) 100 MG capsule; Take 1 capsule (100 mg total) by mouth 3 (three) times daily as needed for cough.  Dispense: 40 capsule; Refill: 0  Follow-up  with PCP in 2 weeks for blood pressure recheck/follow-up of regular medical problems. Note given today for absence from work today.  Return if symptoms worsen or fail to improve. Caren Macadam, MD 08/27/2017

## 2017-09-13 ENCOUNTER — Ambulatory Visit (INDEPENDENT_AMBULATORY_CARE_PROVIDER_SITE_OTHER): Payer: 59

## 2017-09-13 ENCOUNTER — Ambulatory Visit (HOSPITAL_COMMUNITY)
Admission: EM | Admit: 2017-09-13 | Discharge: 2017-09-13 | Disposition: A | Discharge status: Home / Self Care | Payer: 59 | Attending: Family Medicine | Admitting: Family Medicine

## 2017-09-13 ENCOUNTER — Telehealth: Payer: Self-pay | Admitting: Family Medicine

## 2017-09-13 ENCOUNTER — Encounter (HOSPITAL_COMMUNITY): Payer: Self-pay | Admitting: Emergency Medicine

## 2017-09-13 DIAGNOSIS — R140 Abdominal distension (gaseous): Secondary | ICD-10-CM | POA: Diagnosis not present

## 2017-09-13 DIAGNOSIS — K5900 Constipation, unspecified: Secondary | ICD-10-CM | POA: Diagnosis not present

## 2017-09-13 DIAGNOSIS — J069 Acute upper respiratory infection, unspecified: Secondary | ICD-10-CM

## 2017-09-13 MED ORDER — POLYETHYLENE GLYCOL 3350 17 GM/SCOOP PO POWD: 17.0000 g | Freq: Every day | ORAL | 0 refills | Status: DC

## 2017-09-13 NOTE — Discharge Instructions (Signed)
Increase water intake to help with sinus symptoms as well as with constipation. Increase fiber in your diet.  Miralax, may take twice a day for the next 2-3 days to promote large Bm. May use daily or titrate to dose to allow for regular bowel movements. If persistent bloating, abdominal pain or constipation please follow up with your PCP and/or GI physician.  If worsening of pain, fevers, blood in stool, vomiting or otherwise worsening please go to the Er.

## 2017-09-13 NOTE — ED Triage Notes (Signed)
PT reports abdominal bloating/ distension that started Saturday. PT reports umbilical pain as well.  PT also started with URI symptoms yesterday.

## 2017-09-13 NOTE — ED Provider Notes (Signed)
[place]    CSN: [id number] Arrival date & time: 09/13/17  1255     History   Chief Complaint Chief Complaint  Patient presents with  . Bloated  . URI    HPI Christina [name] is a 42 y.o. female.   Christina [name] presents with complaints of abdominal bloating and tenderness surrounding the umbilicus. She states she has noticed increase in her abdominal size for the past 6 weeks. She states she has been working out and actually losing weight, however her waist has increased by 5 inches. She does not have significant gas. States she does have a history of gastroparesis with occasional nausea. Without vomiting over the past 48 hours. She states her stools are soft but over the past few days she has noticed smaller stool output and like she needs to still go. No known blood to stool. She is urinating. She see's Gi at [facility] in [place], called to make an appointment and could not get in until April.    She states she also developed cold symptoms yesterday with runny nose, sore throat, cough, congestion. 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 Without headache or known fevers. Has not taken any medications for symptoms. Hx htn, DM, gerd, gastroparesis.    ROS per HPI.       Past Medical History:  Diagnosis Date  . Anemia   . Bipolar disorder ([place])   . Depression   . Diabetes mellitus    type 2 DM, insulin only needed during pregnancy  . Gastroparesis   . 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 GERD (gastroesophageal reflux disease)   . H/O acute sinusitis 10/2016  . Hypertension   . Thyroid enlargement   . Wears glasses     Patient Active Problem List   Diagnosis Date Noted  . Left otitis media 07/11/2017  . Sore throat 07/11/2017  . Epigastric pain 03/04/2017  . Bipolar 1 disorder, mixed, moderate ([place]) 06/17/2016  . Diabetes mellitus type 2 in obese ([place]) 03/16/2016  . Acute maxillary sinusitis 05/03/2015  . Bipolar 1 disorder  ([place]) 11/19/2014  . Rectal bleeding 03/30/2014  . Fatty liver 10/21/2013  . Seasonal allergies 09/26/2013  . Essential hypertension, benign 08/09/2013  . Iron deficiency anemia 05/15/2013  . Family history of blood disorder 02/28/2013  . 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 GASTROPARESIS 09/02/2009  . GERD 01/27/2009  . Morbid obesity ([place]) 01/05/2008    Past Surgical History:  Procedure Laterality Date  . BREAST REDUCTION SURGERY  1994  . CESAREAN SECTION N/A 03/06/2016   Procedure: CESAREAN SECTION;  Surgeon: Ena Dawley, MD;  Location: [place];  Service: Obstetrics;  Laterality: N/A;  . dermoid tumor  2000  . DILITATION & CURRETTAGE/HYSTROSCOPY WITH NOVASURE ABLATION N/A 12/10/2016   Procedure: DILATATION & CURETTAGE/HYSTEROSCOPY WITH NOVASURE ABLATION;  Surgeon: Ena Dawley, MD;  Location: [facility];  Service: Gynecology;  Laterality: N/A;  . ESOPHAGOGASTRODUODENOSCOPY  07/01/2009   AJO:INOMVE esphagus without barrett's/dilation with 16 mm/mild erthyema in the antrum. mild chronic gastritis on path.   Theodoro Kalata FINGER RELEASE  Nov 2016  . TUBAL LIGATION Bilateral 03/06/2016   Procedure: BILATERAL TUBAL LIGATION;  Surgeon: Ena Dawley, MD;  Location: [place];  Service: Obstetrics;  Laterality: Bilateral;    OB History    Gravida Para Term Preterm AB Living   4 3 2 1    3  SAB TAB Ectopic Multiple Live Births         0 3       Home Medications    Prior to Admission medications   Medication Sig Start Date End Date Taking? Authorizing Provider  azelastine (ASTELIN) 0.1 % nasal spray Place 2 sprays into both nostrils 2 (two) times daily. Use in each nostril as directed Patient taking differently: Place 2 sprays daily as needed into both nostrils for rhinitis or allergies. Use in each nostril as directed 10/19/16  Yes [name], MD  dexlansoprazole (DEXILANT) 60 MG capsule Take 1  capsule (60 mg total) by mouth daily. Patient taking differently: Take 60 mg every evening by mouth.  03/12/17  Yes Annitta Needs, NP  glipiZIDE (GLUCOTROL XL) 10 MG 24 hr tablet TAKE 2 TABLETS BY MOUTH  EVERY MORNING AT [facility] 08/13/17  Yes [name], MD  Insulin Lispro Prot & Lispro (HUMALOG 75/25 MIX) (75-25) 100 UNIT/ML Kwikpen Inject 15 Units into the skin at bedtime. Patient taking differently: Inject 18 Units into the skin at bedtime.  04/14/17  Yes [name], MD  loratadine (CLARITIN) 10 MG tablet Take 1 tablet (10 mg total) by mouth daily. Taking generic otc allergy 07/10/16  Yes [name], MD  losartan (COZAAR) 25 MG tablet TAKE 1 TABLET BY MOUTH  DAILY 03/29/17  Yes [name], MD  metFORMIN (GLUCOPHAGE) 1000 MG tablet Take 1 tablet (1,000 mg total) by mouth 2 (two) times daily with a meal. 04/06/17  Yes [name], MD  triamterene-hydrochlorothiazide (MAXZIDE-25) 37.5-25 MG tablet TAKE 1 TABLET BY MOUTH  EVERY DAY 08/27/17  Yes [name], MD  acetaminophen (TYLENOL) 500 MG tablet Take 1,000 mg every 8 (eight) hours as needed by mouth for mild pain.     [provider]  ARIPiprazole (ABILIFY) 5 MG tablet Take 1 tablet (5 mg total) by mouth daily. Patient taking differently: Take 5 mg every evening by mouth.  05/21/17   Arfeen, Arlyce Harman, MD  B-D UF III MINI PEN NEEDLES 31G X 5 MM MISC USE AS DIRECTED 07/02/17   [name], MD  benzonatate (TESSALON) 100 MG capsule Take 1 capsule (100 mg total) by mouth 3 (three) times daily as needed for cough. 08/27/17   Caren Macadam, MD  Ca Carbonate-Mag Hydroxide (ROLAIDS) 550-110 MG CHEW Chew 1-2 tablets by mouth once.    [provider]  fluconazole (DIFLUCAN) 150 MG tablet One tablet twice weekly , as needed, for vaginal itch associated with prolonged antibiotic use 07/18/17   [name], MD  glucose blood test strip Use as instructed, three times daily 04/08/17    [name], MD  [facility] LANCETS 36U MISC Three times daily testing dx e11.9 04/08/17   [name], MD  polyethylene glycol powder (GLYCOLAX/MIRALAX) powder Take 17 g by mouth daily. 09/13/17   Zigmund Gottron, NP  valACYclovir (VALTREX) 500 MG tablet Take 1,000 mg as needed by mouth (for 3 days if needed for out breaks).  06/29/16   [provider]  vitamin C (ASCORBIC ACID) 500 MG tablet Take 500 mg by mouth daily.    [provider]    Family History Family History  Problem Relation Age of Onset  . Diabetes Mother   . Hypertension Mother   . Fibromyalgia Mother   . Diabetes Father   . Hypertension Father   . Asthma Father   . Heart disease Father   .  Colon cancer Neg Hx     Social History Social History   Tobacco Use  . Smoking status: Never Smoker  . Smokeless tobacco: Never Used  . Tobacco comment: Never smoked   Substance Use Topics  . Alcohol use: Yes    Alcohol/week: 0.0 oz    Comment: occ; had 2 glasses of wine last week while on vacation  . Drug use: No     Allergies   Other; Peanut oil; Peanut-containing drug products; Ace inhibitors; Invokana [canagliflozin]; and Augmentin [amoxicillin-pot clavulanate]   Review of Systems Review of Systems   Physical Exam Triage Vital Signs ED Triage Vitals  Enc Vitals Group     BP 09/13/17 1349 126/87     Pulse Rate 09/13/17 1349 96     Resp 09/13/17 1349 16     Temp 09/13/17 1349 99.3 F (37.4 C)     Temp Source 09/13/17 1349 Oral     SpO2 09/13/17 1349 97 %     Weight 09/13/17 1347 238 lb (108 kg)     Height 09/13/17 1347 5\' 5"  (1.651 m)     Head Circumference --      Peak Flow --      Pain Score 09/13/17 1347 3     Pain Loc --      Pain Edu? --      Excl. in [place]? --    No data found.  Updated Vital Signs BP 126/87   Pulse 96   Temp 99.3 F (37.4 C) (Oral)   Resp 16   Ht 5\' 5"  (1.651 m)   Wt 238 lb (108 kg)   SpO2 97%   BMI 39.61 kg/m   Visual  Acuity Right Eye Distance:   Left Eye Distance:   Bilateral Distance:    Right Eye Near:   Left Eye Near:    Bilateral Near:     Physical Exam  Constitutional: She is oriented to person, place, and time. She appears well-developed and well-nourished. No distress.  HENT:  Head: Normocephalic and atraumatic.  Right Ear: Tympanic membrane, external ear and ear canal normal.  Left Ear: Tympanic membrane, external ear and ear canal normal.  Nose: Nose normal.  Mouth/Throat: Uvula is midline, oropharynx is clear and moist and mucous membranes are normal. No tonsillar exudate.  Eyes: Conjunctivae and EOM are normal. Pupils are equal, round, and reactive to light.  Cardiovascular: Normal rate, regular rhythm and normal heart sounds.  Pulmonary/Chest: Effort normal and breath sounds normal.  Abdominal: Soft. She exhibits distension. Bowel sounds are decreased. There is no hepatosplenomegaly, splenomegaly or hepatomegaly. There is tenderness in the right upper quadrant, epigastric area and left upper quadrant. There is no rigidity, no rebound, no guarding, no CVA tenderness and negative Murphy's sign. No hernia.  Without obvious palpable hernia; quite significant distention noted  Lymphadenopathy:    She has no cervical adenopathy.  Neurological: She is alert and oriented to person, place, and time.  Skin: Skin is warm and dry.     UC Treatments / Results  Labs (all labs ordered are listed, but only abnormal results are displayed) Labs Reviewed - No data to display  EKG  EKG Interpretation None       Radiology Dg Abd 2 Views  Result Date: 09/13/2017 CLINICAL DATA:  Abdominal bloating EXAM: ABDOMEN - 2 VIEW COMPARISON:  None. FINDINGS: The bowel gas pattern is normal. There is no evidence of free air. No radio-opaque calculi or other significant radiographic abnormality is  seen. Stool throughout the colon. IMPRESSION: Stool throughout the colon is compatible with constipation. No  focal abdominal abnormality. Electronically Signed   By: Ulyses Jarred M.D.   On: 09/13/2017 14:43    Procedures Procedures (including critical care time)  Medications Ordered in UC Medications - No data to display   Initial Impression / Assessment and Plan / UC Course  I have reviewed the triage vital signs and the nursing notes.  Pertinent labs & imaging results that were available during my care of the patient were reviewed by me and considered in my medical decision making (see chart for details).     URI symptoms consistent with viral uri. Supportive cares recommended. Bloating with change in bm's correlate to xray which is concerning for constipation. Miralax, increased water and fiber intake recommended. Encouraged follow up with PCP and/or GI for continued management. Return precautions provided. Patient verbalized understanding and agreeable to plan.    Final Clinical Impressions(s) / UC Diagnoses   Final diagnoses:  Bloating  Constipation, unspecified constipation type  Viral upper respiratory tract infection    ED Discharge Orders        Ordered    polyethylene glycol powder (GLYCOLAX/MIRALAX) powder  Daily     09/13/17 1500       Controlled Substance Prescriptions [place] Controlled Substance Registry consulted? Not Applicable   Zigmund Gottron, NP 09/13/17 1503

## 2017-09-13 NOTE — Telephone Encounter (Signed)
Pt is calling she has a possible Hernia-please call [phone],

## 2017-09-15 ENCOUNTER — Encounter: Payer: Self-pay | Admitting: Family Medicine

## 2017-09-15 ENCOUNTER — Other Ambulatory Visit: Payer: Self-pay

## 2017-09-15 ENCOUNTER — Ambulatory Visit: Payer: 59 | Admitting: Family Medicine

## 2017-09-15 VITALS — BP 138/86 | HR 106 | Temp 97.8°F | Resp 17 | Ht 64.5 in | Wt 238.0 lb

## 2017-09-15 DIAGNOSIS — J309 Allergic rhinitis, unspecified: Secondary | ICD-10-CM

## 2017-09-15 DIAGNOSIS — J0111 Acute recurrent frontal sinusitis: Secondary | ICD-10-CM | POA: Diagnosis not present

## 2017-09-15 MED ORDER — FLUTICASONE PROPIONATE 50 MCG/ACT NA SUSP: 2.0000 | Freq: Every day | NASAL | 6 refills | Status: DC

## 2017-09-15 MED ORDER — CLINDAMYCIN HCL 300 MG PO CAPS: 300.0000 mg | ORAL_CAPSULE | Freq: Three times a day (TID) | ORAL | 0 refills | Status: DC

## 2017-09-15 NOTE — Progress Notes (Signed)
Patient ID: Christina [name], female    DOB: 02-14-1976, 42 y.o.   MRN: [id number]  Chief Complaint  Patient presents with  . Sinus Problem    Allergies Other; Peanut oil; Peanut-containing drug products; Ace inhibitors; Invokana [canagliflozin]; and Augmentin [amoxicillin-pot clavulanate]  Subjective:   Christina [name] is a 42 y.o. female who presents to [facility] today.  HPI Here for visit b/c she believe she has a sinus infection. Reports that has an upcoming dental surgery on Tuesday, 09/21/17. Has to start antibtiotics on Sunday, 09/19/17, for upcoming surgery. Is supposed to start on Clindamycin. Reports that she has had a cough, congestion for four days. Then 3 day ago went to the [facility]. Was told it was viral. They took her out of work. Went back to work this morning, but has progressively felt worse. Reports that has been coughing up gray sputum and draining yellow drainage from her nose.    Was last treated with zpack on 1/25 for bronchitis. Felt like symptoms got better and completed back to normal self.  Has had frontal sinus pain, not maxillary pain. Has had a sore throat. No fever now but has had had them over past 3 days, max temp 99.5. Using saline and astelin. Has not used afrin b/c of BP. Feels tired and low energy. No SOB.     Past Medical History:  Diagnosis Date  . Anemia   . Bipolar disorder ([place])   . Depression   . Diabetes mellitus    type 2 DM, insulin only needed during pregnancy  . Gastroparesis   . 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 GERD (gastroesophageal reflux disease)   . H/O acute sinusitis 10/2016  . Hypertension   . Thyroid enlargement   . Wears glasses     Past Surgical History:  Procedure Laterality Date  . BREAST REDUCTION SURGERY  1994  . CESAREAN SECTION N/A 03/06/2016   Procedure: CESAREAN SECTION;  Surgeon: Ena Dawley, MD;  Location: [place];  Service: Obstetrics;  Laterality: N/A;  . dermoid tumor   2000  . DILITATION & CURRETTAGE/HYSTROSCOPY WITH NOVASURE ABLATION N/A 12/10/2016   Procedure: DILATATION & CURETTAGE/HYSTEROSCOPY WITH NOVASURE ABLATION;  Surgeon: Ena Dawley, MD;  Location: [facility];  Service: Gynecology;  Laterality: N/A;  . ESOPHAGOGASTRODUODENOSCOPY  07/01/2009   MOQ:HUTMLY esphagus without barrett's/dilation with 16 mm/mild erthyema in the antrum. mild chronic gastritis on path.   Theodoro Kalata FINGER RELEASE  Nov 2016  . TUBAL LIGATION Bilateral 03/06/2016   Procedure: BILATERAL TUBAL LIGATION;  Surgeon: Ena Dawley, MD;  Location: [place];  Service: Obstetrics;  Laterality: Bilateral;    Family History  Problem Relation Age of Onset  . Diabetes Mother   . Hypertension Mother   . Fibromyalgia Mother   . Diabetes Father   . Hypertension Father   . Asthma Father   . Heart disease Father   . Colon cancer Neg Hx      Social History   Socioeconomic History  . Marital status: Married    Spouse name: None  . Number of children: None  . Years of education: None  . Highest education level: None  Social Needs  . Financial resource strain: None  . Food insecurity - worry: None  . Food insecurity - inability: None  . Transportation needs - medical: None  . Transportation needs - non-medical: None  Occupational History  . Occupation: Research officer, trade union  of [place]  Tobacco Use  . Smoking status: Never Smoker  . Smokeless tobacco: Never Used  . Tobacco comment: Never smoked   Substance and Sexual Activity  . Alcohol use: Yes    Alcohol/week: 0.0 oz    Comment: occ; had 2 glasses of wine last week while on vacation  . Drug use: No  . Sexual activity: Yes    Partners: Male    Birth control/protection: Rhythm    Comment: Last Intercourse about 10 days ago  Other Topics Concern  . None  Social History Narrative  . None    Review of Systems  Constitutional: Positive for chills, fatigue and fever.  HENT: Positive for  congestion, dental problem, postnasal drip, rhinorrhea, sinus pressure, sinus pain and sore throat. Negative for mouth sores, trouble swallowing and voice change.   Respiratory: Positive for cough. Negative for choking, shortness of breath, wheezing and stridor.   Cardiovascular: Negative for chest pain, palpitations and leg swelling.  Gastrointestinal: Negative for abdominal pain, diarrhea and nausea.  Genitourinary: Negative for dysuria.  Neurological: Negative for tremors, syncope and light-headedness.  Hematological: Negative for adenopathy.     Objective:   BP 138/86 (BP Location: Left Arm, Patient Position: Sitting, Cuff Size: Normal)   Pulse (!) 106   Temp 97.8 F (36.6 C) (Temporal)   Resp 17   Ht 5' 4.5" (1.638 m)   Wt 238 lb (108 kg)   SpO2 99%   BMI 40.22 kg/m   Physical Exam  Constitutional: She appears well-developed and well-nourished.  HENT:  Head: Normocephalic and atraumatic.  Nose: Mucosal edema, rhinorrhea and sinus tenderness present. Right sinus exhibits frontal sinus tenderness. Left sinus exhibits frontal sinus tenderness.  Mouth/Throat: Uvula is midline, oropharynx is clear and moist and mucous membranes are normal. Mucous membranes are not pale. No oral lesions. No oropharyngeal exudate, posterior oropharyngeal edema or posterior oropharyngeal erythema. No tonsillar exudate.  Turbinate hyeremia bilaterally, right greater than left.   Eyes: Conjunctivae and EOM are normal. Pupils are equal, round, and reactive to light.  Neck: Normal range of motion. Neck supple. No tracheal deviation present. No thyromegaly present.  Tender submandibular lymph nodes palpable.   Cardiovascular: Normal rate, regular rhythm and normal heart sounds.  Vitals reviewed.    Assessment and Plan  1. Acute recurrent frontal sinusitis Treat with clindamycin as above. Patient counseled in detail regarding the risks of medication. Told to call or return to clinic if develop any  worrisome signs or symptoms. Patient voiced understanding.  Does not need to do the clindamycin per dentist, b/c this will replace that dosing. Voiced understanding.  - clindamycin (CLEOCIN) 300 MG capsule; Take 1 capsule (300 mg total) by mouth 3 (three) times daily.  Dispense: 21 capsule; Refill: 0  2. Allergic rhinitis, unspecified seasonality, unspecified trigger Start :  - fluticasone (FLONASE) 50 MCG/ACT nasal spray; Place 2 sprays into both nostrils daily.  Dispense: 16 g; Refill: 6  Follow up with PCP.  Return if symptoms worsen or fail to improve. Caren Macadam, MD 09/15/2017

## 2017-09-16 NOTE — Telephone Encounter (Signed)
Advised she call her GI dr and she had already went to urgent care, it was not a hernia

## 2017-09-17 ENCOUNTER — Ambulatory Visit (HOSPITAL_COMMUNITY): Payer: 59 | Admitting: Psychiatry

## 2017-09-19 ENCOUNTER — Encounter: Payer: Self-pay | Admitting: Family Medicine

## 2017-09-20 ENCOUNTER — Other Ambulatory Visit (HOSPITAL_COMMUNITY): Payer: Self-pay

## 2017-09-20 DIAGNOSIS — F3162 Bipolar disorder, current episode mixed, moderate: Secondary | ICD-10-CM

## 2017-09-20 MED ORDER — ARIPIPRAZOLE 5 MG PO TABS: 5.0000 mg | ORAL_TABLET | Freq: Every day | ORAL | 0 refills | Status: DC

## 2017-09-21 ENCOUNTER — Other Ambulatory Visit: Payer: Self-pay

## 2017-09-21 ENCOUNTER — Telehealth: Payer: Self-pay

## 2017-09-21 ENCOUNTER — Ambulatory Visit: Payer: 59 | Admitting: Gastroenterology

## 2017-09-21 ENCOUNTER — Encounter: Payer: Self-pay | Admitting: Gastroenterology

## 2017-09-21 VITALS — BP 115/81 | HR 99 | Temp 97.6°F | Ht 64.5 in | Wt 238.4 lb

## 2017-09-21 DIAGNOSIS — K219 Gastro-esophageal reflux disease without esophagitis: Secondary | ICD-10-CM

## 2017-09-21 DIAGNOSIS — K429 Umbilical hernia without obstruction or gangrene: Secondary | ICD-10-CM

## 2017-09-21 DIAGNOSIS — M6208 Separation of muscle (nontraumatic), other site: Secondary | ICD-10-CM | POA: Diagnosis not present

## 2017-09-21 DIAGNOSIS — R1013 Epigastric pain: Secondary | ICD-10-CM

## 2017-09-21 DIAGNOSIS — K3184 Gastroparesis: Secondary | ICD-10-CM

## 2017-09-21 DIAGNOSIS — K625 Hemorrhage of anus and rectum: Secondary | ICD-10-CM | POA: Diagnosis not present

## 2017-09-21 MED ORDER — DEXLANSOPRAZOLE 60 MG PO CPDR: 60.0000 mg | DELAYED_RELEASE_CAPSULE | Freq: Every day | ORAL | 3 refills | Status: DC

## 2017-09-21 NOTE — Patient Instructions (Signed)
1. Colonoscopy and possible upper endoscopy as scheduled. See separate instructions.  2. Take Linzess 133mcg daily on empty stomach starting at least one week before your colonoscopy to help you get adequate prep.  3. Take Dexilant rebate card and see if it helps with your high copay.  4. Call if abdominal pain worsens, would consider CT scan prior to your colonoscopy in that case.

## 2017-09-21 NOTE — Progress Notes (Addendum)
REVIEWED-NO ADDITIONAL RECOMMENDATIONS. Primary Care Physician:  [name], MD  Primary Gastroenterologist:  Barney Drain, MD   Chief Complaint  Patient presents with  . Abdominal Pain    around the naval and bottom of abd x 12 days    HPI:  Christina [name] is a 42 y.o. female here for further evaluation of abdominal pain. She was last seen in 05/2017 for f/u GERD, gastroparesis, IDA. Last EGD 2010 with mild chronic gastritis.   She was scheduled for colonoscopy and possible EGD back in 05/2017 but was cancelled twice due to personal illness. Indications were for brbpr, drop in ferritin later part of 2018. Prior ruq u/s 03/2017 unremarkable.   Patient states she was doing okay until a couple of weeks ago. Started having pain around [employer], noticed it at first when leaned again a counter. Had noted weakness in the upper abd with sitting up and standing. Abdomen protruding more than usual. Complains of upper abd bloating and discomfort. Typical reflux well controlled on Dexilant but copay high. Has not used rebate card. No dysphagia.   Went to urgent care recently for abd pain as well as URI sx. abd film showed constipation. Typically has a  BM maybe once a day. Incomplete stools at time, and would go back again. If feels like needs a BM she drinks milk. Usually will cause BM. She has had intermittent brbpr but now recently. No melena. Frequent nausea but no vomiting.     Current Outpatient Medications  Medication Sig Dispense Refill  . acetaminophen (TYLENOL) 500 MG tablet Take 1,000 mg every 8 (eight) hours as needed by mouth for mild pain.     . ARIPiprazole (ABILIFY) 5 MG tablet Take 1 tablet (5 mg total) by mouth daily. 90 tablet 0  . azelastine (ASTELIN) 0.1 % nasal spray Place 2 sprays into both nostrils 2 (two) times daily. Use in each nostril as directed (Patient taking differently: Place 2 sprays daily as needed into both nostrils for rhinitis or allergies. Use in each  nostril as directed) 30 mL 12  . B-D UF III MINI PEN NEEDLES 31G X 5 MM MISC USE AS DIRECTED 100 each 2  . benzonatate (TESSALON) 100 MG capsule Take 1 capsule (100 mg total) by mouth 3 (three) times daily as needed for cough. 40 capsule 0  . clindamycin (CLEOCIN) 300 MG capsule Take 1 capsule (300 mg total) by mouth 3 (three) times daily. 21 capsule 0  . dexlansoprazole (DEXILANT) 60 MG capsule Take 1 capsule (60 mg total) by mouth daily. (Patient taking differently: Take 60 mg every evening by mouth. ) 90 capsule 3  . fluconazole (DIFLUCAN) 150 MG tablet One tablet twice weekly , as needed, for vaginal itch associated with prolonged antibiotic use 6 tablet 0  . fluticasone (FLONASE) 50 MCG/ACT nasal spray Place 2 sprays into both nostrils daily. 16 g 6  . glipiZIDE (GLUCOTROL XL) 10 MG 24 hr tablet TAKE 2 TABLETS BY MOUTH  EVERY MORNING AT BREAKFAT 180 tablet 1  . glucose blood test strip Use as instructed, three times daily 300 each 12  . Insulin Lispro Prot & Lispro (HUMALOG 75/25 MIX) (75-25) 100 UNIT/ML Kwikpen Inject 15 Units into the skin at bedtime. (Patient taking differently: Inject 18 Units into the skin at bedtime. ) 15 mL 1  . loratadine (CLARITIN) 10 MG tablet Take 1 tablet (10 mg total) by mouth daily. Taking generic otc allergy 90 tablet 1  . losartan (COZAAR) 25  MG tablet TAKE 1 TABLET BY MOUTH  DAILY 90 tablet 0  . metFORMIN (GLUCOPHAGE) 1000 MG tablet Take 1 tablet (1,000 mg total) by mouth 2 (two) times daily with a meal. 180 tablet 3  . ONETOUCH DELICA LANCETS 06Y MISC Three times daily testing dx e11.9 300 each 5  . polyethylene glycol powder (GLYCOLAX/MIRALAX) powder Take 17 g by mouth daily. (Patient taking differently: Take 17 g by mouth as needed. ) 255 g 0  . triamterene-hydrochlorothiazide (MAXZIDE-25) 37.5-25 MG tablet TAKE 1 TABLET BY MOUTH  EVERY DAY 90 tablet 0  . valACYclovir (VALTREX) 500 MG tablet Take 1,000 mg as needed by mouth (for 3 days if needed for out  breaks).      No current facility-administered medications for this visit.     Allergies as of 09/21/2017 - Review Complete 09/21/2017  Allergen Reaction Noted  . Other Anaphylaxis 06/13/2013  . 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 Peanut-containing drug products Anaphylaxis 02/08/2013  . Ace inhibitors Cough 10/10/2010  . Invokana [canagliflozin] Other (See Comments) 04/18/2014  . Augmentin [amoxicillin-pot clavulanate] Nausea And Vomiting 04/07/2013    Past Medical History:  Diagnosis Date  . Anemia   . Bipolar disorder ([place])   . Depression   . Diabetes mellitus    type 2 DM, insulin only needed during pregnancy  . Gastroparesis   . 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 GERD (gastroesophageal reflux disease)   . H/O acute sinusitis 10/2016  . Hypertension   . Thyroid enlargement   . Wears glasses     Past Surgical History:  Procedure Laterality Date  . BREAST REDUCTION SURGERY  1994  . CESAREAN SECTION N/A 03/06/2016   Procedure: CESAREAN SECTION;  Surgeon: Ena Dawley, MD;  Location: [place];  Service: Obstetrics;  Laterality: N/A;  . dermoid tumor  2000  . DILITATION & CURRETTAGE/HYSTROSCOPY WITH NOVASURE ABLATION N/A 12/10/2016   Procedure: DILATATION & CURETTAGE/HYSTEROSCOPY WITH NOVASURE ABLATION;  Surgeon: Ena Dawley, MD;  Location: [facility];  Service: Gynecology;  Laterality: N/A;  . ESOPHAGOGASTRODUODENOSCOPY  07/01/2009   IRS:WNIOEV esphagus without barrett's/dilation with 16 mm/mild erthyema in the antrum. mild chronic gastritis on path.   Theodoro Kalata FINGER RELEASE  Nov 2016  . TUBAL LIGATION Bilateral 03/06/2016   Procedure: BILATERAL TUBAL LIGATION;  Surgeon: Ena Dawley, MD;  Location: [place];  Service: Obstetrics;  Laterality: Bilateral;    Family History  Problem Relation Age of Onset  . Diabetes Mother   . Hypertension Mother   . Fibromyalgia Mother   . Diabetes Father   . Hypertension Father   .  Asthma Father   . Heart disease Father   . Colon cancer Neg Hx     Social History   Socioeconomic History  . Marital status: Married    Spouse name: Not on file  . Number of children: Not on file  . Years of education: Not on file  . Highest education level: Not on file  Social Needs  . Financial resource strain: Not on file  . Food insecurity - worry: Not on file  . Food insecurity - inability: Not on file  . Transportation needs - medical: Not on file  . Transportation needs - non-medical: Not on file  Occupational History  . Occupation: Research officer, trade union of [place]  Tobacco Use  . Smoking status: Never Smoker  . Smokeless tobacco: Never Used  . Tobacco comment: Never smoked   Substance and Sexual  Activity  . Alcohol use: Yes    Alcohol/week: 0.0 oz    Comment: occ; had 2 glasses of wine last week while on vacation  . Drug use: No  . Sexual activity: Yes    Partners: Male    Birth control/protection: Rhythm    Comment: Last Intercourse about 10 days ago  Other Topics Concern  . Not on file  Social History Narrative  . Not on file      ROS:  General: Negative for anorexia, weight loss, fever, chills, fatigue, weakness. Eyes: Negative for vision changes.  ENT: Negative for hoarseness, difficulty swallowing , nasal congestion. CV: Negative for chest pain, angina, palpitations, dyspnea on exertion, peripheral edema.  Respiratory: Negative for dyspnea at rest, dyspnea on exertion, cough, sputum, wheezing.  GI: See history of present illness. GU:  Negative for dysuria, hematuria, urinary incontinence, urinary frequency, nocturnal urination.  MS: Negative for joint pain, low back pain.  Derm: Negative for rash or itching.  Neuro: Negative for weakness, abnormal sensation, seizure, frequent headaches, memory loss, confusion.  Psych: Negative for anxiety, depression, suicidal ideation, hallucinations.  Endo: Negative for unusual weight change.  Heme: Negative for  bruising or bleeding. Allergy: Negative for rash or hives.    Physical Examination:  BP 115/81   Pulse 99   Temp 97.6 F (36.4 C) (Oral)   Ht 5' 4.5" (1.638 m)   Wt 238 lb 6.4 oz (108.1 kg)   BMI 40.29 kg/m    General: Well-nourished, well-developed in no acute distress.  Head: Normocephalic, atraumatic.   Eyes: Conjunctiva pink, no icterus. Mouth: Oropharyngeal mucosa moist and pink , no lesions erythema or exudate. Neck: Supple without thyromegaly, masses, or lymphadenopathy.  Lungs: Clear to auscultation bilaterally.  Heart: Regular rate and rhythm, no murmurs rubs or gallops.  Abdomen: Bowel sounds are normal, obese. Rectus diastasis noted. Tender in epigastrium, small umb hernia noted, fingertip in diameter, easily reducible, slightly tender, no hepatosplenomegaly or masses, no abdominal bruits, no rebound or guarding.   Rectal: not performed Extremities: No lower extremity edema. No clubbing or deformities.  Neuro: Alert and oriented x 4 , grossly normal neurologically.  Skin: Warm and dry, no rash or jaundice.   Psych: Alert and cooperative, normal mood and affect.  Labs: Lab Results  Component Value Date   CREATININE 0.78 07/07/2017   BUN 8 07/07/2017   NA 141 07/07/2017   K 4.6 07/07/2017   CL 103 07/07/2017   CO2 25 07/07/2017   Lab Results  Component Value Date   ALT 14 07/07/2017   AST 12 07/07/2017   ALKPHOS 63 07/07/2017   BILITOT <0.2 07/07/2017   Lab Results  Component Value Date   WBC 2.7 (L) 06/28/2017   HGB 13.7 06/28/2017   HCT 43.0 06/28/2017   MCV 93.1 06/28/2017   PLT 342 06/28/2017   Lab Results  Component Value Date   IRON 158 03/20/2017         FERRITIN 41 03/20/2017     Imaging Studies: Dg Abd 2 Views  Result Date: 09/13/2017 CLINICAL DATA:  Abdominal bloating EXAM: ABDOMEN - 2 VIEW COMPARISON:  None. FINDINGS: The bowel gas pattern is normal. There is no evidence of free air. No radio-opaque calculi or other significant  radiographic abnormality is seen. Stool throughout the colon. IMPRESSION: Stool throughout the colon is compatible with constipation. No focal abdominal abnormality. Electronically Signed   By: Ulyses Jarred M.D.   On: 09/13/2017 14:43

## 2017-09-21 NOTE — Assessment & Plan Note (Signed)
H/o intermittent brbpr and no prior colonoscopy. Hgb normal. She did have some decreased iron/ferritin several months back but this has improved. ?sequelae of recent pregnancy. Given her brbpr and upper abdominal discomfort, nausea, ?recent iron deficiency she was scheduled for TCS +/- EGD several months back but never able to complete due to illness.   Proceed with TCS +/- EGD in near future. Plan for deep sedation with propofol due to polypharmacy.  I have discussed the risks, alternatives, benefits with regards to but not limited to the risk of reaction to medication, bleeding, infection, perforation and the patient is agreeable to proceed. Written consent to be obtained.

## 2017-09-21 NOTE — Progress Notes (Signed)
cc'ed to pcp °

## 2017-09-21 NOTE — Assessment & Plan Note (Signed)
Tolerating, some nausea but really no vomited. Likely contributing to her feeling of fullness and bloating.

## 2017-09-21 NOTE — Telephone Encounter (Signed)
Called and informed pt of pre-op appt 10/20/17 at 10:00am. Letter mailed.

## 2017-09-21 NOTE — Assessment & Plan Note (Signed)
Intermittently with flare of symptoms, lately better controlled but recurrent symptoms if misses a dose of Dexilant. copay high, asking to be switched to generic. Will try with rebate card first.

## 2017-09-21 NOTE — Assessment & Plan Note (Signed)
Tiny umbilical hernia slightly tender but easily reducible. Plan for colonoscopy with possible upper endoscopy as outlined. Discussed with patient if your abd pain worsens, I would elect to pursue CT prior to endoscopic evaluation. She voiced understanding. Await findings, may need CT A/P to evaluate hernia as well as large rectus diastasis (to make sure no wall defect there as well).

## 2017-09-21 NOTE — Patient Instructions (Signed)
PA info for TCS/-/+EGD submitted via [facility] website. Case approved. PA# [id number].

## 2017-09-22 ENCOUNTER — Encounter: Payer: Self-pay | Admitting: Gastroenterology

## 2017-09-22 NOTE — Telephone Encounter (Signed)
Patient sent mychart message inquiring about her umbilical hernia pain. See message.    She has rectus diastasis with possible ventral hernia and umbilical pain with umbilical hernia.   Please schedule CT A/P with contrast for above reasons.  She will need updated met-7.   For now she will continue tylenol as per bottle instructions and/or ibuprofen 469m every 8 hours as needed for pain.

## 2017-09-23 ENCOUNTER — Other Ambulatory Visit: Payer: Self-pay

## 2017-09-23 ENCOUNTER — Telehealth: Payer: Self-pay

## 2017-09-23 DIAGNOSIS — R109 Unspecified abdominal pain: Secondary | ICD-10-CM

## 2017-09-23 DIAGNOSIS — M6208 Separation of muscle (nontraumatic), other site: Secondary | ICD-10-CM

## 2017-09-23 DIAGNOSIS — K429 Umbilical hernia without obstruction or gangrene: Secondary | ICD-10-CM

## 2017-09-23 NOTE — Telephone Encounter (Signed)
Christina [name], please see chart e-mail. Magda Paganini is ordering a CT. I have spoken to pt, she is aware of the plan of CT and lab work. She is also aware she can take either Tylenol or Ibuprofen. I could not forward the message to you.

## 2017-09-23 NOTE — Telephone Encounter (Signed)
Pt will need to go to [place] for CT d/t recent change for [facility]. Tried to call pt, no answer, LMOVM and informed pt to call office and let me know if she is ok with going to [place].

## 2017-09-23 NOTE — Telephone Encounter (Signed)
Pt called office. She is ok with going to [place]. She is aware [facility] will call her to schedule CT. PA info for CT abd/pelvis with contrast submitted via [facility] website. Case approved. PA# [id number], 09/23/17-11/07/17. Called and informed [facility] of the order, they will call pt to schedule.

## 2017-09-28 NOTE — Telephone Encounter (Signed)
Called [facility] to f/u on CT being scheduled. Receptionist seen order and will call pt.

## 2017-09-29 ENCOUNTER — Ambulatory Visit (INDEPENDENT_AMBULATORY_CARE_PROVIDER_SITE_OTHER): Payer: 59 | Admitting: Psychiatry

## 2017-09-29 ENCOUNTER — Encounter (HOSPITAL_COMMUNITY): Payer: Self-pay | Admitting: Psychiatry

## 2017-09-29 DIAGNOSIS — F3162 Bipolar disorder, current episode mixed, moderate: Secondary | ICD-10-CM

## 2017-09-29 DIAGNOSIS — Z5689 Other problems related to employment: Secondary | ICD-10-CM

## 2017-09-29 DIAGNOSIS — Z9114 Patient's other noncompliance with medication regimen: Secondary | ICD-10-CM | POA: Diagnosis not present

## 2017-09-29 MED ORDER — ARIPIPRAZOLE 5 MG PO TABS: 5.0000 mg | ORAL_TABLET | Freq: Every day | ORAL | 0 refills | Status: DC

## 2017-09-29 NOTE — Progress Notes (Signed)
BH MD/PA/NP OP Progress Note  09/29/2017 12:23 PM Christina [name]  MRN:  [id number]  Chief Complaint: I apologized missing appointment.  I have been to multiple doctors.  I have colonoscopy and upcoming root canal surgery.  HPI: Patient came for her follow-up appointment.  She missed her last appointment and apologized missing appointment.  She has been busy seeing multiple doctors.  She has a colonoscopy and upcoming root canal surgery.  She is taking Abilify and she feels it is helping her mood irritability mood swing and anger.  She is sleeping good.  She also busy giving interviews as she is looking for a new job.  She endorsed her job is stressful and she did not get raised recently and she is disappointed.  She is been working at the same place for more than a 4 years.  Overall she describes her mood is good.  She admitted sometimes noncompliant with diet and insulin and her last hemoglobin A1c was 8.4.  She is scheduled to have another blood work next week.  She is hoping that hemoglobin A1c will drop since she is checking her blood sugar regularly and her long-term plan is to come off from the insulin.  Patient lives with her husband and 3 children.  Patient denies drinking alcohol or using any illegal substances.  Her own parents lives close by.  The patient has a good supportive network.  Her energy level is fair.  Visit Diagnosis:    ICD-10-CM   1. Bipolar 1 disorder, mixed, moderate (HCC) F31.62 ARIPiprazole (ABILIFY) 5 MG tablet    Past Psychiatric History: Viewed. Patient has been seeing in this office since 2006. She was referred from her primary care physician Dr. Moshe Cipro. She has history of depression and mania and psychosis. In 1997 she was admitted at [facility] because of suicidal gesture. In the past she has taken Prozac and Wellbutrin. She denies any history of sexual, verbal emotional abuse.   Past Medical History:  Past Medical History:  Diagnosis Date  . Anemia   . Bipolar  disorder ([place])   . Depression   . Diabetes mellitus    type 2 DM, insulin only needed during pregnancy  . Gastroparesis   . 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 GERD (gastroesophageal reflux disease)   . H/O acute sinusitis 10/2016  . Hypertension   . Thyroid enlargement   . Wears glasses     Past Surgical History:  Procedure Laterality Date  . BREAST REDUCTION SURGERY  1994  . CESAREAN SECTION N/A 03/06/2016   Procedure: CESAREAN SECTION;  Surgeon: Ena Dawley, MD;  Location: [facility];  Service: Obstetrics;  Laterality: N/A;  . dermoid tumor  2000  . DILITATION & CURRETTAGE/HYSTROSCOPY WITH NOVASURE ABLATION N/A 12/10/2016   Procedure: DILATATION & CURETTAGE/HYSTEROSCOPY WITH NOVASURE ABLATION;  Surgeon: Ena Dawley, MD;  Location: [facility];  Service: Gynecology;  Laterality: N/A;  . ESOPHAGOGASTRODUODENOSCOPY  07/01/2009   [facility] esphagus without barrett's/dilation with 16 mm/mild erthyema in the antrum. mild chronic gastritis on path.   Theodoro Kalata FINGER RELEASE  Nov 2016  . TUBAL LIGATION Bilateral 03/06/2016   Procedure: BILATERAL TUBAL LIGATION;  Surgeon: Ena Dawley, MD;  Location: [facility];  Service: Obstetrics;  Laterality: Bilateral;    Family Psychiatric History: Viewed.  Family History:  Family History  Problem Relation Age of Onset  . Diabetes Mother   . Hypertension Mother   . Fibromyalgia Mother   . Diabetes  Father   . Hypertension Father   . Asthma Father   . Heart disease Father   . Colon cancer Neg Hx     Social History:  Social History   Socioeconomic History  . Marital status: Married    Spouse name: Not on file  . Number of children: Not on file  . Years of education: Not on file  . Highest education level: Not on file  Social Needs  . Financial resource strain: Not on file  . Food insecurity - worry: Not on file  . Food insecurity - inability: Not on file  . Transportation needs - medical:  Not on file  . Transportation needs - non-medical: Not on file  Occupational History  . Occupation: Research officer, [employer]  Tobacco Use  . Smoking status: Never Smoker  . Smokeless tobacco: Never Used  . Tobacco comment: Never smoked   Substance and Sexual Activity  . Alcohol use: Yes    Alcohol/week: 0.0 oz    Comment: occ; had 2 glasses of wine last week while on vacation  . Drug use: No  . Sexual activity: Yes    Partners: Male    Birth control/protection: Rhythm    Comment: Last Intercourse about 10 days ago  Other Topics Concern  . Not on file  Social History Narrative  . Not on file    Allergies:  Allergies  Allergen Reactions  . Other Anaphylaxis    Tree nuts  . Peanut Oil Anaphylaxis  . Peanut-Containing Drug Products Anaphylaxis  . Ace Inhibitors Cough    Pt on no contaception and is at reproductive age, discussed with nephrology also  . Invokana [Canagliflozin] Other (See Comments)     Worried about ketoacidosis  . Augmentin [Amoxicillin-Pot Clavulanate] Nausea And Vomiting    Causes stomach cramps. Patient can take amoxicillin, not Augmentin.    Metabolic Disorder Labs: Recent Results (from the past 2160 hour(s))  POCT rapid strep A     Status: None   Collection Time: 07/06/17 11:07 AM  Result Value Ref Range   Rapid Strep A Screen Negative Negative  Hemoglobin A1c     Status: Abnormal   Collection Time: 07/07/17  8:17 AM  Result Value Ref Range   Hgb A1c MFr Bld 8.4 (H) 4.8 - 5.6 %    Comment:          Prediabetes: 5.7 - 6.4          Diabetes: >6.4          Glycemic control for adults with diabetes: <7.0    Est. average glucose Bld gHb Est-mCnc 194 mg/dL  Lipid panel     Status: None   Collection Time: 07/07/17  8:17 AM  Result Value Ref Range   Cholesterol, Total 125 100 - 199 mg/dL   Triglycerides 132 0 - 149 mg/dL   HDL 51 >39 mg/dL   VLDL Cholesterol Cal 26 5 - 40 mg/dL   LDL Calculated 48 0 - 99 mg/dL   Chol/HDL Ratio 2.5 0.0 - 4.4  ratio    Comment:                                   T. Chol/HDL Ratio  Men  Women                               1/2 Avg.Risk  3.4    3.3                                   Avg.Risk  5.0    4.4                                2X Avg.Risk  9.6    7.1                                3X Avg.Risk 23.4   11.0   CMP14+EGFR     Status: Abnormal   Collection Time: 07/07/17  8:17 AM  Result Value Ref Range   Glucose 228 (H) 65 - 99 mg/dL   BUN 8 6 - 24 mg/dL   Creatinine, Ser 0.78 0.57 - 1.00 mg/dL   GFR calc non Af Amer 95 >59 mL/min/1.73   GFR calc Af Amer 109 >59 mL/min/1.73   BUN/Creatinine Ratio 10 9 - 23   Sodium 141 134 - 144 mmol/L   Potassium 4.6 3.5 - 5.2 mmol/L   Chloride 103 96 - 106 mmol/L   CO2 25 20 - 29 mmol/L   Calcium 9.3 8.7 - 10.2 mg/dL   Total Protein 6.7 6.0 - 8.5 g/dL   Albumin 4.3 3.5 - 5.5 g/dL   Globulin, Total 2.4 1.5 - 4.5 g/dL   Albumin/Globulin Ratio 1.8 1.2 - 2.2   Bilirubin Total <0.2 0.0 - 1.2 mg/dL   Alkaline Phosphatase 63 39 - 117 IU/L   AST 12 0 - 40 IU/L   ALT 14 0 - 32 IU/L  Upper Respiratory Culture, Routine     Status: Abnormal   Collection Time: 07/07/17  8:18 AM  Result Value Ref Range   Upper Respiratory Culture Final report (A)    Result 1 Comment (A)     Comment: Moraxella (branhamella) catarrhalis Heavy growth Beta lactamase positive.    Result 2 Comment     Comment: Routine respiratory flora Heavy growth   Specimen status report     Status: None (Preliminary result)   Collection Time: 07/07/17  8:18 AM  Result Value Ref Range   specimen status report Comment     Comment: No Bacterial Swab Received  Susceptibility, Aer + Anaerob     Status: Abnormal   Collection Time: 07/07/17  8:18 AM  Result Value Ref Range   Suscept, Aer + Anaerob Final report (A)    Suscept Result 1 Comment (A)     Comment: Moraxella (branhamella) catarrhalis Beta lactamase positive. Testing performed by broth  microdilution. CEFUROXIME <=4UG/ML SUSCEPTIBLE    Antimicrobial Suscept Comment     Comment:       ** S = Susceptible; I = Intermediate; R = Resistant **                    P = Positive; N = Negative             MICS are expressed in micrograms per mL    Antibiotic  RSLT#1    RSLT#2    RSLT#3    RSLT#4 Cefotaxime                     S Ceftazidime                    S Ciprofloxacin                  S Levofloxacin                   S   Specimen status report     Status: None   Collection Time: 07/07/17  8:18 AM  Result Value Ref Range   specimen status report Comment     Comment: Written Authorization Written Authorization Written Authorization Received. Authorization received from [facility] 07-15-2017 Logged by Vista Lawman    Lab Results  Component Value Date   HGBA1C 8.4 (H) 07/07/2017   MPG 154 06/12/2016   MPG 137 03/16/2016   No results found for: PROLACTIN Lab Results  Component Value Date   CHOL 125 07/07/2017   TRIG 132 07/07/2017   HDL 51 07/07/2017   CHOLHDL 2.5 07/07/2017   VLDL 26 03/16/2016   LDLCALC 48 07/07/2017   LDLCALC 40 03/16/2016   Lab Results  Component Value Date   TSH 0.822 03/20/2017   TSH 0.79 03/16/2016    Therapeutic Level Labs: No results found for: LITHIUM No results found for: VALPROATE No components found for:  CBMZ  Current Medications: Current Outpatient Medications  Medication Sig Dispense Refill  . acetaminophen (TYLENOL) 500 MG tablet Take 1,000 mg every 8 (eight) hours as needed by mouth for mild pain.     . ARIPiprazole (ABILIFY) 5 MG tablet Take 1 tablet (5 mg total) by mouth daily. 90 tablet 0  . azelastine (ASTELIN) 0.1 % nasal spray Place 2 sprays into both nostrils 2 (two) times daily. Use in each nostril as directed (Patient taking differently: Place 2 sprays daily as needed into both nostrils for rhinitis or allergies. Use in each nostril as directed) 30 mL 12  . B-D UF III MINI PEN NEEDLES  31G X 5 MM MISC USE AS DIRECTED 100 each 2  . benzonatate (TESSALON) 100 MG capsule Take 1 capsule (100 mg total) by mouth 3 (three) times daily as needed for cough. 40 capsule 0  . clindamycin (CLEOCIN) 300 MG capsule Take 1 capsule (300 mg total) by mouth 3 (three) times daily. 21 capsule 0  . dexlansoprazole (DEXILANT) 60 MG capsule Take 1 capsule (60 mg total) by mouth daily. 90 capsule 3  . fluconazole (DIFLUCAN) 150 MG tablet One tablet twice weekly , as needed, for vaginal itch associated with prolonged antibiotic use 6 tablet 0  . fluticasone (FLONASE) 50 MCG/ACT nasal spray Place 2 sprays into both nostrils daily. 16 g 6  . glipiZIDE (GLUCOTROL XL) 10 MG 24 hr tablet TAKE 2 TABLETS BY MOUTH  EVERY MORNING AT BREAKFAT 180 tablet 1  . glucose blood test strip Use as instructed, three times daily 300 each 12  . Insulin Lispro Prot & Lispro (HUMALOG 75/25 MIX) (75-25) 100 UNIT/ML Kwikpen Inject 15 Units into the skin at bedtime. (Patient taking differently: Inject 18 Units into the skin at bedtime. ) 15 mL 1  . loratadine (CLARITIN) 10 MG tablet Take 1 tablet (10 mg total) by mouth daily. Taking generic otc allergy 90 tablet 1  . losartan (COZAAR) 25 MG tablet TAKE  1 TABLET BY MOUTH  DAILY 90 tablet 0  . metFORMIN (GLUCOPHAGE) 1000 MG tablet Take 1 tablet (1,000 mg total) by mouth 2 (two) times daily with a meal. 180 tablet 3  . ONETOUCH DELICA LANCETS 62X MISC Three times daily testing dx e11.9 300 each 5  . polyethylene glycol powder (GLYCOLAX/MIRALAX) powder Take 17 g by mouth daily. (Patient taking differently: Take 17 g by mouth as needed. ) 255 g 0  . triamterene-hydrochlorothiazide (MAXZIDE-25) 37.5-25 MG tablet TAKE 1 TABLET BY MOUTH  EVERY DAY 90 tablet 0  . valACYclovir (VALTREX) 500 MG tablet Take 1,000 mg as needed by mouth (for 3 days if needed for out breaks).      No current facility-administered medications for this visit.      Musculoskeletal: Strength & Muscle Tone:  within normal limits Gait & Station: normal Patient leans: N/A  Psychiatric Specialty Exam: ROS  Blood pressure 116/81, pulse 84, height 5' 4.5" (1.638 m), weight 237 lb 9.6 oz (107.8 kg), unknown if currently breastfeeding.Body mass index is 40.15 kg/m.  General Appearance: Casual  Eye Contact:  Good  Speech:  Clear and Coherent  Volume:  Normal  Mood:  Anxious  Affect:  Appropriate  Thought Process:  Goal Directed  Orientation:  Full (Time, Place, and Person)  Thought Content: Logical   Suicidal Thoughts:  No  Homicidal Thoughts:  No  Memory:  Immediate;   Good Recent;   Good Remote;   Good  Judgement:  Good  Insight:  Good  Psychomotor Activity:  Normal  Concentration:  Concentration: Fair and Attention Span: Fair  Recall:  Good  Fund of Knowledge: Good  Language: Good  Akathisia:  No  Handed:  Right  AIMS (if indicated): not done  Assets:  Communication Skills Desire for Improvement Resilience Talents/Skills Transportation  ADL's:  Intact  Cognition: WNL  Sleep:  Good   Screenings: PHQ2-9     Office Visit from 08/17/2017 in [facility] Office Visit from 05/11/2017 in [facility] Office Visit from 10/16/2016 in [facility] Office Visit from 06/01/2016 in [facility] Nutrition from 10/03/2015 in [facility]  PHQ-2 Total Score  0  0  0  0  0       Assessment and Plan: Bipolar disorder type I.  I review her blood work results and collect information.  Her hemoglobin C is still 8.4.  However she reported that lately her blood sugar is getting better and she is watching her diet.  We have discussed if her hemoglobin C remains high then we will consider to [place].  However at this time she is reluctant to change and hoping to have her hemoglobin A1c improved.  We also talked about counseling and patient agreed to see a therapist in this office.  I will continue Abilify 5 mg daily.  Recommended to  call us back if she has any question, concern if she feels worsening of the symptoms.  Discussed healthy lifestyle and watching her calorie intake.  Follow-up in 3 months.   Kathlee Nations, MD 09/29/2017, 12:23 PM

## 2017-10-01 NOTE — Telephone Encounter (Signed)
CT hasn't been scheduled. Tried to call pt, no answer, LMOVM. Gave pt phone number to [facility] for her to call them to schedule appt.

## 2017-10-04 ENCOUNTER — Other Ambulatory Visit: Payer: Self-pay | Admitting: Family Medicine

## 2017-10-04 NOTE — Telephone Encounter (Signed)
According to pt's appt desk, CT is scheduled for 10/14/17.

## 2017-10-05 LAB — CMP14+EGFR
ALT: 11 IU/L (ref 0–32)
AST: 9 IU/L (ref 0–40)
Albumin/Globulin Ratio: 1.7 (ref 1.2–2.2)
Albumin: 4.6 g/dL (ref 3.5–5.5)
Alkaline Phosphatase: 56 IU/L (ref 39–117)
BUN/Creatinine Ratio: 10 (ref 9–23)
BUN: 9 mg/dL (ref 6–24)
Bilirubin Total: 0.3 mg/dL (ref 0.0–1.2)
CO2: 25 mmol/L (ref 20–29)
Calcium: 9.9 mg/dL (ref 8.7–10.2)
Chloride: 101 mmol/L (ref 96–106)
Creatinine, Ser: 0.86 mg/dL (ref 0.57–1.00)
GFR calc Af Amer: 97 mL/min/{1.73_m2} (ref >59)
GFR calc non Af Amer: 84 mL/min/{1.73_m2} (ref >59)
Globulin, Total: 2.7 g/dL (ref 1.5–4.5)
Glucose: 128 mg/dL — ABNORMAL HIGH (ref 65–99)
Potassium: 4.5 mmol/L (ref 3.5–5.2)
Sodium: 141 mmol/L (ref 134–144)
Total Protein: 7.3 g/dL (ref 6.0–8.5)

## 2017-10-05 LAB — HEMOGLOBIN A1C
Est. average glucose Bld gHb Est-mCnc: 192 mg/dL
Hgb A1c MFr Bld: 8.3 % — ABNORMAL HIGH (ref 4.8–5.6)

## 2017-10-05 LAB — LIPID PANEL
Chol/HDL Ratio: 2.8 ratio (ref 0.0–4.4)
Cholesterol, Total: 176 mg/dL (ref 100–199)
HDL: 64 mg/dL (ref >39)
LDL Calculated: 88 mg/dL (ref 0–99)
Triglycerides: 120 mg/dL (ref 0–149)
VLDL Cholesterol Cal: 24 mg/dL (ref 5–40)

## 2017-10-07 ENCOUNTER — Encounter: Payer: Self-pay | Admitting: Family Medicine

## 2017-10-07 ENCOUNTER — Ambulatory Visit: Payer: 59 | Admitting: Family Medicine

## 2017-10-07 VITALS — BP 130/84 | HR 83 | Resp 16 | Ht 64.5 in | Wt 241.0 lb

## 2017-10-07 DIAGNOSIS — E1065 Type 1 diabetes mellitus with hyperglycemia: Secondary | ICD-10-CM | POA: Diagnosis not present

## 2017-10-07 DIAGNOSIS — F3162 Bipolar disorder, current episode mixed, moderate: Secondary | ICD-10-CM | POA: Diagnosis not present

## 2017-10-07 DIAGNOSIS — I10 Essential (primary) hypertension: Secondary | ICD-10-CM | POA: Diagnosis not present

## 2017-10-07 NOTE — Patient Instructions (Signed)
F/u in 3 months, call if you need me before  Start insulin at dinner 8 units before dinner, and commit to taking your tablets every day in the morning  Test and record blood sugars before breakfast and at bedtime , may test up to 4 times daily  Send  message with questions  Goal for fasting blood sugar ranges from 80 to 120 and 2 hours after any meal or at bedtime should be between 130 to 170.   Good that exercise is under control, use resource from your insurance as a tool to help you along   It is important that you exercise regularly at least 30 minutes 5 times a week. If you develop chest pain, have severe difficulty breathing, or feel very tired, stop exercising immediately and seek medical attention  Thank you  for choosing [facility]. We consider it a privelige to serve you.  Delivering excellent health care in a caring and  compassionate way is our goal.  Partnering with you,  so that together we can achieve this goal is our strategy.

## 2017-10-10 ENCOUNTER — Encounter: Payer: Self-pay | Admitting: Family Medicine

## 2017-10-10 NOTE — Progress Notes (Signed)
Christina [name]     MRN: [id number]      DOB: 1975/12/04   HPI Christina [name] is here for follow up and re-evaluation of chronic medical conditions, medication management and review of any available recent lab and radiology data.  Preventive health is updated, specifically  Cancer screening and Immunization.   Has had recurrent sinusitis twice since lst seen and now being treated for dental infection. Has colonoscopy  Scheduled and has been diagnosed with a hernia The PT denies any adverse reactions to current medications since the last visit.  She is not testing blood sugars as she should and has not been compliant with medication, often "forgets" evening metformin Denies polyuria, polydipsia, blurred vision , or hypoglycemic episodes. Will start grad school for counseling in the Fall through [employer] Denies recent fever or chills. Denies sinus pressure, nasal congestion, ear pain or sore throat. Denies chest congestion, productive cough or wheezing. Denies chest pains, palpitations and leg swelling Denies abdominal pain, nausea, vomiting,diarrhea or constipation.   Denies dysuria, frequency, hesitancy or incontinence. Denies joint pain, swelling and limitation in mobility. Denies headaches, seizures, numbness, or tingling. Denies uncontrolled depression, anxiety or insomnia. Denies skin break down or rash.   PE  BP 130/84   Pulse 83   Resp 16   Ht 5' 4.5" (1.638 m)   Wt 241 lb (109.3 kg)   SpO2 97%   BMI 40.73 kg/m   Patient alert and oriented and in no cardiopulmonary distress.  HEENT: No facial asymmetry, EOMI,   oropharynx pink and moist.  Neck supple no JVD, no mass.  Chest: Clear to auscultation bilaterally.  CVS: S1, S2 no murmurs, no S3.Regular rate.  ABD: Soft non tender.   Ext: No edema  MS: Adequate ROM spine, shoulders, hips and knees.  Skin: Intact, no ulcerations or rash noted.  Psych: Good eye contact, normal affect. Memory intact not anxious or  depressed appearing.  CNS: CN 2-12 intact, power,  normal throughout.no focal deficits noted.   Assessment & Plan Diabetes mellitus type 2 in obese (HCC) Uncontrolled and unchanged, needs to be more compliant with medications and with diet Christina [name] is reminded of the importance of commitment to daily physical activity for 30 minutes or more, as able and the need to limit carbohydrate intake to 30 to 60 grams per meal to help with blood sugar control.   The need to take medication as prescribed, test blood sugar as directed, and to call between visits if there is a concern that blood sugar is uncontrolled is also discussed.   Christina [name] is reminded of the importance of daily foot exam, annual eye examination, and good blood sugar, blood pressure and cholesterol control.  Diabetic Labs Latest Ref Rng & Units 10/04/2017 07/07/2017 06/28/2017 06/28/2017 03/20/2017  HbA1c 4.8 - 5.6 % 8.3(H) 8.4(H) - 8.6(H) 8.2(H)  Microalbumin Not Estab. ug/mL - - - - -  Micro/Creat Ratio 0.0 - 30.0 mg/g creat - - - - -  Chol 100 - 199 mg/dL 176 125 - - -  HDL >39 mg/dL 64 51 - - -  Calc LDL 0 - 99 mg/dL 88 48 - - -  Triglycerides 0 - 149 mg/dL 120 132 - - -  Creatinine 0.57 - 1.00 mg/dL 0.86 0.78 0.82 0.91 0.96   BP/Weight 10/07/2017 09/21/2017 09/15/2017 09/13/2017 08/27/2017 08/17/2017 77/04/3967  Systolic BP 864 847 207 218 288 337 445  Diastolic BP 84 81 86 87 88 86  84  Wt. (Lbs) 241 238.4 238 238 236.5 240.04 238  BMI 40.73 40.29 40.22 39.61 39.97 36.5 36.19  Some encounter information is confidential and restricted. Go to Review Flowsheets activity to see all data.   Foot/eye exam completion dates Latest Ref Rng & Units 07/06/2017 12/16/2016  Eye Exam No Retinopathy - No Retinopathy  Foot Form Completion - Done -        Essential hypertension, benign Controlled, no change in medication DASH diet and commitment to daily physical activity for a minimum of 30 minutes discussed and encouraged, as a part of  hypertension management. The importance of attaining a healthy weight is also discussed.  BP/Weight 10/07/2017 09/21/2017 09/15/2017 09/13/2017 08/27/2017 08/17/2017 41/01/3844  Systolic BP 364 680 321 224 825 003 704  Diastolic BP 84 81 86 87 88 86 84  Wt. (Lbs) 241 238.4 238 238 236.5 240.04 238  BMI 40.73 40.29 40.22 39.61 39.97 36.5 36.19  Some encounter information is confidential and restricted. Go to Review Flowsheets activity to see all data.       Morbid obesity Deteriorated. Patient re-educated about  the importance of commitment to a  minimum of 150 minutes of exercise per week.  The importance of healthy food choices with portion control discussed. Encouraged to start a food diary, count calories and to consider  joining a support group. Sample diet sheets offered. Goals set by the patient for the next several months.   Weight /BMI 10/07/2017 09/21/2017 09/15/2017  WEIGHT 241 lb 238 lb 6.4 oz 238 lb  HEIGHT 5' 4.5" 5' 4.5" 5' 4.5"  BMI 40.73 kg/m2 40.29 kg/m2 40.22 kg/m2  Some encounter information is confidential and restricted. Go to Review Flowsheets activity to see all data.      Bipolar 1 disorder, mixed, moderate (HCC) Stable currently and managed by psychiatry

## 2017-10-10 NOTE — Assessment & Plan Note (Signed)
Controlled, no change in medication DASH diet and commitment to daily physical activity for a minimum of 30 minutes discussed and encouraged, as a part of hypertension management. The importance of attaining a healthy weight is also discussed.  BP/Weight 10/07/2017 09/21/2017 09/15/2017 09/13/2017 08/27/2017 08/17/2017 35/10/6142  Systolic BP 315 400 867 619 509 326 712  Diastolic BP 84 81 86 87 88 86 84  Wt. (Lbs) 241 238.4 238 238 236.5 240.04 238  BMI 40.73 40.29 40.22 39.61 39.97 36.5 36.19  Some encounter information is confidential and restricted. Go to Review Flowsheets activity to see all data.

## 2017-10-10 NOTE — Assessment & Plan Note (Signed)
Deteriorated. Patient re-educated about  the importance of commitment to a  minimum of 150 minutes of exercise per week.  The importance of healthy food choices with portion control discussed. Encouraged to start a food diary, count calories and to consider  joining a support group. Sample diet sheets offered. Goals set by the patient for the next several months.   Weight /BMI 10/07/2017 09/21/2017 09/15/2017  WEIGHT 241 lb 238 lb 6.4 oz 238 lb  HEIGHT 5' 4.5" 5' 4.5" 5' 4.5"  BMI 40.73 kg/m2 40.29 kg/m2 40.22 kg/m2  Some encounter information is confidential and restricted. Go to Review Flowsheets activity to see all data.

## 2017-10-10 NOTE — Assessment & Plan Note (Signed)
Stable currently  and managed by psychiatry 

## 2017-10-10 NOTE — Assessment & Plan Note (Signed)
Uncontrolled and unchanged, needs to be more compliant with medications and with diet Christina [name] is reminded of the importance of commitment to daily physical activity for 30 minutes or more, as able and the need to limit carbohydrate intake to 30 to 60 grams per meal to help with blood sugar control.   The need to take medication as prescribed, test blood sugar as directed, and to call between visits if there is a concern that blood sugar is uncontrolled is also discussed.   Christina [name] is reminded of the importance of daily foot exam, annual eye examination, and good blood sugar, blood pressure and cholesterol control.  Diabetic Labs Latest Ref Rng & Units 10/04/2017 07/07/2017 06/28/2017 06/28/2017 03/20/2017  HbA1c 4.8 - 5.6 % 8.3(H) 8.4(H) - 8.6(H) 8.2(H)  Microalbumin Not Estab. ug/mL - - - - -  Micro/Creat Ratio 0.0 - 30.0 mg/g creat - - - - -  Chol 100 - 199 mg/dL 176 125 - - -  HDL >39 mg/dL 64 51 - - -  Calc LDL 0 - 99 mg/dL 88 48 - - -  Triglycerides 0 - 149 mg/dL 120 132 - - -  Creatinine 0.57 - 1.00 mg/dL 0.86 0.78 0.82 0.91 0.96   BP/Weight 10/07/2017 09/21/2017 09/15/2017 09/13/2017 08/27/2017 08/17/2017 72/01/2034  Systolic BP 597 416 384 536 468 032 122  Diastolic BP 84 81 86 87 88 86 84  Wt. (Lbs) 241 238.4 238 238 236.5 240.04 238  BMI 40.73 40.29 40.22 39.61 39.97 36.5 36.19  Some encounter information is confidential and restricted. Go to Review Flowsheets activity to see all data.   Foot/eye exam completion dates Latest Ref Rng & Units 07/06/2017 12/16/2016  Eye Exam No Retinopathy - No Retinopathy  Foot Form Completion - Done -

## 2017-10-14 ENCOUNTER — Other Ambulatory Visit: Payer: Self-pay

## 2017-10-14 ENCOUNTER — Ambulatory Visit (HOSPITAL_COMMUNITY): Payer: Self-pay | Admitting: Psychology

## 2017-10-14 NOTE — Patient Instructions (Addendum)
Christina [name]  10/14/2017     @PREFPERIOPPHARMACY @   Your procedure is scheduled on  10/26/2017.  Report to [facility] at  1030   A.M.  Call this number if you have problems the morning of surgery:  [phone]   Remember:  Do not eat food or drink liquids after midnight.  Take these medicines the morning of surgery with A SIP OF WATER  Hydrocodone, cozaar. Take only 4 units of your Humalog insulin the night before your surgery. DO NOT take any medications for diabetes the morning of your surgery.   Do not wear jewelry, make-up or nail polish.  Do not wear lotions, powders, or perfumes, or deodorant.  Do not shave 48 hours prior to surgery.  Men may shave face and neck.  Do not bring valuables to the hospital.  [facility] is not responsible for any belongings or valuables.  Contacts, dentures or bridgework may not be worn into surgery.  Leave your suitcase in the car.  After surgery it may be brought to your room.  For patients admitted to the hospital, discharge time will be determined by your treatment team.  Patients discharged the day of surgery will not be allowed to drive home.   Name and phone number of your driver:   family Special instructions:  Follow the diet and prep instructions given to you by Dr Nona Dell.  Please read over the following fact sheets that you were given. Anesthesia Post-op Instructions and Care and Recovery After Surgery       Esophagogastroduodenoscopy Esophagogastroduodenoscopy (EGD) is a procedure to examine the lining of the esophagus, stomach, and first part of the small intestine (duodenum). This procedure is done to check for problems such as inflammation, bleeding, ulcers, or growths. During this procedure, a long, flexible, lighted tube with a camera attached (endoscope) is inserted down the throat. Tell a health care provider about:  Any allergies you have.  All medicines you are taking, including vitamins,  herbs, eye drops, creams, and over-the-counter medicines.  Any problems you or family members have had with anesthetic medicines.  Any blood disorders you have.  Any surgeries you have had.  Any medical conditions you have.  Whether you are pregnant or may be pregnant. What are the risks? Generally, this is a safe procedure. However, problems may occur, including:  Infection.  Bleeding.  A tear (perforation) in the esophagus, stomach, or duodenum.  Trouble breathing.  Excessive sweating.  Spasms of the larynx.  A slowed heartbeat.  Low blood pressure.  What happens before the procedure?  Follow instructions from your health care provider about eating or drinking restrictions.  Ask your health care provider about: ? Changing or stopping your regular medicines. This is especially important if you are taking diabetes medicines or blood thinners. ? Taking medicines such as aspirin and ibuprofen. These medicines can thin your blood. Do not take these medicines before your procedure if your health care provider instructs you not to.  Plan to have someone take you home after the procedure.  If you wear dentures, be ready to remove them before the procedure. What happens during the procedure?  To reduce your risk of infection, your health care team will wash or sanitize their hands.  An IV tube will be put in a vein in your hand or arm. You will get medicines and fluids through this tube.  You will be given  one or more of the following: ? A medicine to help you relax (sedative). ? A medicine to numb the area (local anesthetic). This medicine may be sprayed into your throat. It will make you feel more comfortable and keep you from gagging or coughing during the procedure. ? A medicine for pain.  A mouth guard may be placed in your mouth to protect your teeth and to keep you from biting on the endoscope.  You will be asked to lie on your left side.  The endoscope will be  lowered down your throat into your esophagus, stomach, and duodenum.  Air will be put into the endoscope. This will help your health care provider see better.  The lining of your esophagus, stomach, and duodenum will be examined.  Your health care provider may: ? Take a tissue sample so it can be looked at in a lab (biopsy). ? Remove growths. ? Remove objects (foreign bodies) that are stuck. ? Treat any bleeding with medicines or other devices that stop tissue from bleeding. ? Widen (dilate) or stretch narrowed areas of your esophagus and stomach.  The endoscope will be taken out. The procedure may vary among health care providers and hospitals. What happens after the procedure?  Your blood pressure, heart rate, breathing rate, and blood oxygen level will be monitored often until the medicines you were given have worn off.  Do not eat or drink anything until the numbing medicine has worn off and your gag reflex has returned. This information is not intended to replace advice given to you by your health care provider. Make sure you discuss any questions you have with your health care provider. Document Released: 11/20/2004 Document Revised: 12/26/2015 Document Reviewed: 06/13/2015 Elsevier Interactive Patient Education  2018 [employer]. Esophagogastroduodenoscopy, Care After Refer to this sheet in the next few weeks. These instructions provide you with information about caring for yourself after your procedure. Your health care provider may also give you more specific instructions. Your treatment has been planned according to current medical practices, but problems sometimes occur. Call your health care provider if you have any problems or questions after your procedure. What can I expect after the procedure? After the procedure, it is common to have:  A sore throat.  Nausea.  Bloating.  Dizziness.  Fatigue.  Follow these instructions at home:  Do not eat or drink anything  until the numbing medicine (local anesthetic) has worn off and your gag reflex has returned. You will know that the local anesthetic has worn off when you can swallow comfortably.  Do not drive for 24 hours if you received a medicine to help you relax (sedative).  If your health care provider took a tissue sample for testing during the procedure, make sure to get your test results. This is your responsibility. Ask your health care provider or the department performing the test when your results will be ready.  Keep all follow-up visits as told by your health care provider. This is important. Contact a health care provider if:  You cannot stop coughing.  You are not urinating.  You are urinating less than usual. Get help right away if:  You have trouble swallowing.  You cannot eat or drink.  You have throat or chest pain that gets worse.  You are dizzy or light-headed.  You faint.  You have nausea or vomiting.  You have chills.  You have a fever.  You have severe abdominal pain.  You have black, tarry, or bloody  stools. This information is not intended to replace advice given to you by your health care provider. Make sure you discuss any questions you have with your health care provider. Document Released: 07/06/2012 Document Revised: 12/26/2015 Document Reviewed: 06/13/2015 Elsevier Interactive Patient Education  2018 [employer].  Colonoscopy, Adult A colonoscopy is an exam to look at the large intestine. It is done to check for problems, such as:  Lumps (tumors).  Growths (polyps).  Swelling (inflammation).  Bleeding.  What happens before the procedure? Eating and drinking Follow instructions from your doctor about eating and drinking. These instructions may include:  A few days before the procedure - follow a low-fiber diet. ? Avoid nuts. ? Avoid seeds. ? Avoid dried fruit. ? Avoid raw fruits. ? Avoid vegetables.  1-3 days before the procedure -  follow a clear liquid diet. Avoid liquids that have red or purple dye. Drink only clear liquids, such as: ? Clear broth or bouillon. ? Black coffee or tea. ? Clear juice. ? Clear soft drinks or sports drinks. ? Gelatin dessert. ? Popsicles.  On the day of the procedure - do not eat or drink anything during the 2 hours before the procedure.  Bowel prep If you were prescribed an oral bowel prep:  Take it as told by your doctor. Starting the day before your procedure, you will need to drink a lot of liquid. The liquid will cause you to poop (have bowel movements) until your poop is almost clear or light green.  If your skin or butt gets irritated from diarrhea, you may: ? Wipe the area with wipes that have medicine in them, such as adult wet wipes with aloe and vitamin E. ? Put something on your skin that soothes the area, such as petroleum jelly.  If you throw up (vomit) while drinking the bowel prep, take a break for up to 60 minutes. Then begin the bowel prep again. If you keep throwing up and you cannot take the bowel prep without throwing up, call your doctor.  General instructions  Ask your doctor about changing or stopping your normal medicines. This is important if you take diabetes medicines or blood thinners.  Plan to have someone take you home from the hospital or clinic. What happens during the procedure?  An IV tube may be put into one of your veins.  You will be given medicine to help you relax (sedative).  To reduce your risk of infection: ? Your doctors will wash their hands. ? Your anal area will be washed with soap.  You will be asked to lie on your side with your knees bent.  Your doctor will get a long, thin, flexible tube ready. The tube will have a camera and a light on the end.  The tube will be put into your anus.  The tube will be gently put into your large intestine.  Air will be delivered into your large intestine to keep it open. You may feel some  pressure or cramping.  The camera will be used to take photos.  A small tissue sample may be removed from your body to be looked at under a microscope (biopsy). If any possible problems are found, the tissue will be sent to a lab for testing.  If small growths are found, your doctor may remove them and have them checked for cancer.  The tube that was put into your anus will be slowly removed. The procedure may vary among doctors and hospitals. What happens after  the procedure?  Your doctor will check on you often until the medicines you were given have worn off.  Do not drive for 24 hours after the procedure.  You may have a small amount of blood in your poop.  You may pass gas.  You may have mild cramps or bloating in your belly (abdomen).  It is up to you to get the results of your procedure. Ask your doctor, or the department performing the procedure, when your results will be ready. This information is not intended to replace advice given to you by your health care provider. Make sure you discuss any questions you have with your health care provider. Document Released: 08/22/2010 Document Revised: 05/20/2016 Document Reviewed: 10/01/2015 Elsevier Interactive Patient Education  2017 [employer]  Colonoscopy, Adult, Care After This sheet gives you information about how to care for yourself after your procedure. Your health care provider may also give you more specific instructions. If you have problems or questions, contact your health care provider. What can I expect after the procedure? After the procedure, it is common to have:  A small amount of blood in your stool for 24 hours after the procedure.  Some gas.  Mild abdominal cramping or bloating.  Follow these instructions at home: General instructions   For the first 24 hours after the procedure: ? Do not drive or use machinery. ? Do not sign important documents. ? Do not drink alcohol. ? Do your regular daily  activities at a slower pace than normal. ? Eat soft, easy-to-digest foods. ? Rest often.  Take over-the-counter or prescription medicines only as told by your health care provider.  It is up to you to get the results of your procedure. Ask your health care provider, or the department performing the procedure, when your results will be ready. Relieving cramping and bloating  Try walking around when you have cramps or feel bloated.  Apply heat to your abdomen as told by your health care provider. Use a heat source that your health care provider recommends, such as a moist heat pack or a heating pad. ? Place a towel between your skin and the heat source. ? Leave the heat on for 20-30 minutes. ? Remove the heat if your skin turns bright red. This is especially important if you are unable to feel pain, heat, or cold. You may have a greater risk of getting burned. Eating and drinking  Drink enough fluid to keep your urine clear or pale yellow.  Resume your normal diet as instructed by your health care provider. Avoid heavy or fried foods that are hard to digest.  Avoid drinking alcohol for as long as instructed by your health care provider. Contact a health care provider if:  You have blood in your stool 2-3 days after the procedure. Get help right away if:  You have more than a small spotting of blood in your stool.  You pass large blood clots in your stool.  Your abdomen is swollen.  You have nausea or vomiting.  You have a fever.  You have increasing abdominal pain that is not relieved with medicine. This information is not intended to replace advice given to you by your health care provider. Make sure you discuss any questions you have with your health care provider. Document Released: 03/03/2004 Document Revised: 04/13/2016 Document Reviewed: 10/01/2015 Elsevier Interactive Patient Education  2018 [place] Anesthesia is a term that refers to  techniques, procedures, and medicines that  help a person stay safe and comfortable during a medical procedure. Monitored anesthesia care, or sedation, is one type of anesthesia. Your anesthesia specialist may recommend sedation if you will be having a procedure that does not require you to be unconscious, such as:  Cataract surgery.  A dental procedure.  A biopsy.  A colonoscopy.  During the procedure, you may receive a medicine to help you relax (sedative). There are three levels of sedation:  Mild sedation. At this level, you may feel awake and relaxed. You will be able to follow directions.  Moderate sedation. At this level, you will be sleepy. You may not remember the procedure.  Deep sedation. At this level, you will be asleep. You will not remember the procedure.  The more medicine you are given, the deeper your level of sedation will be. Depending on how you respond to the procedure, the anesthesia specialist may change your level of sedation or the type of anesthesia to fit your needs. An anesthesia specialist will monitor you closely during the procedure. Let your health care provider know about:  Any allergies you have.  All medicines you are taking, including vitamins, herbs, eye drops, creams, and over-the-counter medicines.  Any use of steroids (by mouth or as a cream).  Any problems you or family members have had with sedatives and anesthetic medicines.  Any blood disorders you have.  Any surgeries you have had.  Any medical conditions you have, such as sleep apnea.  Whether you are pregnant or may be pregnant.  Any use of cigarettes, alcohol, or street drugs. What are the risks? Generally, this is a safe procedure. However, problems may occur, including:  Getting too much medicine (oversedation).  Nausea.  Allergic reaction to medicines.  Trouble breathing. If this happens, a breathing tube may be used to help with breathing. It will be removed when you  are awake and breathing on your own.  Heart trouble.  Lung trouble.  Before the procedure Staying hydrated Follow instructions from your health care provider about hydration, which may include:  Up to 2 hours before the procedure - you may continue to drink clear liquids, such as water, clear fruit juice, black coffee, and plain tea.  Eating and drinking restrictions Follow instructions from your health care provider about eating and drinking, which may include:  8 hours before the procedure - stop eating heavy meals or foods such as meat, fried foods, or fatty foods.  6 hours before the procedure - stop eating light meals or foods, such as toast or cereal.  6 hours before the procedure - stop drinking milk or drinks that contain milk.  2 hours before the procedure - stop drinking clear liquids.  Medicines Ask your health care provider about:  Changing or stopping your regular medicines. This is especially important if you are taking diabetes medicines or blood thinners.  Taking medicines such as aspirin and ibuprofen. These medicines can thin your blood. Do not take these medicines before your procedure if your health care provider instructs you not to.  Tests and exams  You will have a physical exam.  You may have blood tests done to show: ? How well your kidneys and liver are working. ? How well your blood can clot.  General instructions  Plan to have someone take you home from the hospital or clinic.  If you will be going home right after the procedure, plan to have someone with you for 24 hours.  What happens during the  procedure?  Your blood pressure, heart rate, breathing, level of pain and overall condition will be monitored.  An IV tube will be inserted into one of your veins.  Your anesthesia specialist will give you medicines as needed to keep you comfortable during the procedure. This may mean changing the level of sedation.  The procedure will be  performed. After the procedure  Your blood pressure, heart rate, breathing rate, and blood oxygen level will be monitored until the medicines you were given have worn off.  Do not drive for 24 hours if you received a sedative.  You may: ? Feel sleepy, clumsy, or nauseous. ? Feel forgetful about what happened after the procedure. ? Have a sore throat if you had a breathing tube during the procedure. ? Vomit. This information is not intended to replace advice given to you by your health care provider. Make sure you discuss any questions you have with your health care provider. Document Released: 04/15/2005 Document Revised: 12/27/2015 Document Reviewed: 11/10/2015 Elsevier Interactive Patient Education  2018 [place], Care After These instructions provide you with information about caring for yourself after your procedure. Your health care provider may also give you more specific instructions. Your treatment has been planned according to current medical practices, but problems sometimes occur. Call your health care provider if you have any problems or questions after your procedure. What can I expect after the procedure? After your procedure, it is common to:  Feel sleepy for several hours.  Feel clumsy and have poor balance for several hours.  Feel forgetful about what happened after the procedure.  Have poor judgment for several hours.  Feel nauseous or vomit.  Have a sore throat if you had a breathing tube during the procedure.  Follow these instructions at home: For at least 24 hours after the procedure:   Do not: ? Participate in activities in which you could fall or become injured. ? Drive. ? Use heavy machinery. ? Drink alcohol. ? Take sleeping pills or medicines that cause drowsiness. ? Make important decisions or sign legal documents. ? Take care of children on your own.  Rest. Eating and drinking  Follow the diet that is  recommended by your health care provider.  If you vomit, drink water, juice, or soup when you can drink without vomiting.  Make sure you have little or no nausea before eating solid foods. General instructions  Have a responsible adult stay with you until you are awake and alert.  Take over-the-counter and prescription medicines only as told by your health care provider.  If you smoke, do not smoke without supervision.  Keep all follow-up visits as told by your health care provider. This is important. Contact a health care provider if:  You keep feeling nauseous or you keep vomiting.  You feel light-headed.  You develop a rash.  You have a fever. Get help right away if:  You have trouble breathing. This information is not intended to replace advice given to you by your health care provider. Make sure you discuss any questions you have with your health care provider. Document Released: 11/10/2015 Document Revised: 03/11/2016 Document Reviewed: 11/10/2015 Elsevier Interactive Patient Education  [employer].

## 2017-10-20 ENCOUNTER — Encounter (HOSPITAL_COMMUNITY)
Admission: RE | Admit: 2017-10-20 | Discharge: 2017-10-20 | Disposition: A | Discharge status: Home / Self Care | Payer: 59 | Source: Ambulatory Visit | Attending: Gastroenterology | Admitting: Gastroenterology

## 2017-10-20 ENCOUNTER — Other Ambulatory Visit: Payer: Self-pay

## 2017-10-20 ENCOUNTER — Encounter (HOSPITAL_COMMUNITY): Payer: Self-pay

## 2017-10-20 DIAGNOSIS — Z01812 Encounter for preprocedural laboratory examination: Secondary | ICD-10-CM | POA: Insufficient documentation

## 2017-10-20 LAB — CBC WITH DIFFERENTIAL/PLATELET
Basophils Absolute: 0 10*3/uL (ref 0.0–0.1)
Basophils Relative: 0 %
Eosinophils Absolute: 0.1 10*3/uL (ref 0.0–0.7)
Eosinophils Relative: 3 %
HCT: 37.9 % (ref 36.0–46.0)
Hemoglobin: 12.3 g/dL (ref 12.0–15.0)
Lymphocytes Relative: 47 %
Lymphs Abs: 2.3 10*3/uL (ref 0.7–4.0)
MCH: 30.7 pg (ref 26.0–34.0)
MCHC: 32.5 g/dL (ref 30.0–36.0)
MCV: 94.5 fL (ref 78.0–100.0)
Monocytes Absolute: 0.3 10*3/uL (ref 0.1–1.0)
Monocytes Relative: 6 %
Neutro Abs: 2.1 10*3/uL (ref 1.7–7.7)
Neutrophils Relative %: 44 %
Platelets: 333 10*3/uL (ref 150–400)
RBC: 4.01 MIL/uL (ref 3.87–5.11)
RDW: 13.4 % (ref 11.5–15.5)
WBC: 4.8 10*3/uL (ref 4.0–10.5)

## 2017-10-20 LAB — HCG, SERUM, QUALITATIVE: Preg, Serum: NEGATIVE

## 2017-10-24 ENCOUNTER — Other Ambulatory Visit: Payer: Self-pay | Admitting: Family Medicine

## 2017-10-26 ENCOUNTER — Encounter (HOSPITAL_COMMUNITY): Payer: Self-pay | Admitting: Gastroenterology

## 2017-10-26 ENCOUNTER — Ambulatory Visit (HOSPITAL_COMMUNITY): Payer: 59 | Admitting: Anesthesiology

## 2017-10-26 ENCOUNTER — Ambulatory Visit (HOSPITAL_COMMUNITY)
Admission: RE | Admit: 2017-10-26 | Discharge: 2017-10-26 | Disposition: A | Discharge status: Home / Self Care | Payer: 59 | Source: Ambulatory Visit | Attending: Gastroenterology | Admitting: Gastroenterology

## 2017-10-26 ENCOUNTER — Encounter (HOSPITAL_COMMUNITY)
Admission: RE | Disposition: A | Discharge status: Home / Self Care | Payer: Self-pay | Source: Ambulatory Visit | Attending: Gastroenterology

## 2017-10-26 DIAGNOSIS — Z888 Allergy status to other drugs, medicaments and biological substances status: Secondary | ICD-10-CM | POA: Diagnosis not present

## 2017-10-26 DIAGNOSIS — Z881 Allergy status to other antibiotic agents status: Secondary | ICD-10-CM | POA: Diagnosis not present

## 2017-10-26 DIAGNOSIS — K648 Other hemorrhoids: Secondary | ICD-10-CM

## 2017-10-26 DIAGNOSIS — K2970 Gastritis, unspecified, without bleeding: Secondary | ICD-10-CM | POA: Diagnosis not present

## 2017-10-26 DIAGNOSIS — R1013 Epigastric pain: Secondary | ICD-10-CM | POA: Diagnosis not present

## 2017-10-26 DIAGNOSIS — K317 Polyp of stomach and duodenum: Secondary | ICD-10-CM | POA: Diagnosis not present

## 2017-10-26 DIAGNOSIS — K3184 Gastroparesis: Secondary | ICD-10-CM | POA: Insufficient documentation

## 2017-10-26 DIAGNOSIS — Z79899 Other long term (current) drug therapy: Secondary | ICD-10-CM | POA: Insufficient documentation

## 2017-10-26 DIAGNOSIS — Z794 Long term (current) use of insulin: Secondary | ICD-10-CM | POA: Diagnosis not present

## 2017-10-26 DIAGNOSIS — K921 Melena: Secondary | ICD-10-CM | POA: Diagnosis not present

## 2017-10-26 DIAGNOSIS — Z8249 Family history of ischemic heart disease and other diseases of the circulatory system: Secondary | ICD-10-CM | POA: Diagnosis not present

## 2017-10-26 DIAGNOSIS — E1143 Type 2 diabetes mellitus with diabetic autonomic (poly)neuropathy: Secondary | ICD-10-CM | POA: Insufficient documentation

## 2017-10-26 DIAGNOSIS — I10 Essential (primary) hypertension: Secondary | ICD-10-CM | POA: Diagnosis not present

## 2017-10-26 DIAGNOSIS — K219 Gastro-esophageal reflux disease without esophagitis: Secondary | ICD-10-CM | POA: Diagnosis not present

## 2017-10-26 DIAGNOSIS — Z91010 Allergy to peanuts: Secondary | ICD-10-CM | POA: Diagnosis not present

## 2017-10-26 DIAGNOSIS — F319 Bipolar disorder, unspecified: Secondary | ICD-10-CM | POA: Diagnosis not present

## 2017-10-26 DIAGNOSIS — Q438 Other specified congenital malformations of intestine: Secondary | ICD-10-CM | POA: Insufficient documentation

## 2017-10-26 DIAGNOSIS — B009 Herpesviral infection, unspecified: Secondary | ICD-10-CM | POA: Diagnosis not present

## 2017-10-26 DIAGNOSIS — Z87892 Personal history of anaphylaxis: Secondary | ICD-10-CM | POA: Insufficient documentation

## 2017-10-26 HISTORY — PX: COLONOSCOPY WITH PROPOFOL: SHX5780

## 2017-10-26 HISTORY — PX: ESOPHAGOGASTRODUODENOSCOPY (EGD) WITH PROPOFOL: SHX5813

## 2017-10-26 LAB — GLUCOSE, CAPILLARY
Glucose-Capillary: 149 mg/dL — ABNORMAL HIGH (ref 65–99)
Glucose-Capillary: 109 mg/dL — ABNORMAL HIGH (ref 65–99)

## 2017-10-26 SURGERY — COLONOSCOPY WITH PROPOFOL
Anesthesia: Monitor Anesthesia Care

## 2017-10-26 MED ORDER — MIDAZOLAM HCL 2 MG/2ML IJ SOLN
INTRAMUSCULAR | Status: AC
Start: 2017-10-26 — End: ?
  Filled 2017-10-26: qty 2

## 2017-10-26 MED ORDER — MIDAZOLAM HCL 2 MG/2ML IJ SOLN
INTRAMUSCULAR | Status: AC
  Filled 2017-10-26: qty 2

## 2017-10-26 MED ORDER — PROPOFOL 10 MG/ML IV BOLUS
INTRAVENOUS | Status: AC
  Filled 2017-10-26: qty 20

## 2017-10-26 MED ORDER — LOSARTAN POTASSIUM 25 MG PO TABS: 25.0000 mg | ORAL_TABLET | Freq: Every day | ORAL | 0 refills | Status: DC

## 2017-10-26 MED ORDER — PROPOFOL 10 MG/ML IV BOLUS
INTRAVENOUS | Status: DC | PRN
  Administered 2017-10-26 (×3): 15 mg via INTRAVENOUS

## 2017-10-26 MED ORDER — MIDAZOLAM HCL 2 MG/2ML IJ SOLN
1.0000 mg | INTRAMUSCULAR | Status: AC
  Administered 2017-10-26: 2 mg via INTRAVENOUS

## 2017-10-26 MED ORDER — LIDOCAINE VISCOUS 2 % MT SOLN
OROMUCOSAL | Status: AC
  Filled 2017-10-26: qty 15

## 2017-10-26 MED ORDER — FENTANYL CITRATE (PF) 100 MCG/2ML IJ SOLN
25.0000 ug | Freq: Once | INTRAMUSCULAR | Status: AC
  Administered 2017-10-26: 25 ug via INTRAVENOUS

## 2017-10-26 MED ORDER — LACTATED RINGERS IV SOLN
INTRAVENOUS | Status: DC
  Administered 2017-10-26: 11:00:00 via INTRAVENOUS

## 2017-10-26 MED ORDER — PROPOFOL 500 MG/50ML IV EMUL
INTRAVENOUS | Status: DC | PRN
  Administered 2017-10-26 (×2): via INTRAVENOUS
  Administered 2017-10-26: 100 ug/kg/min via INTRAVENOUS
  Administered 2017-10-26: 150 ug/kg/min via INTRAVENOUS

## 2017-10-26 MED ORDER — FENTANYL CITRATE (PF) 100 MCG/2ML IJ SOLN
INTRAMUSCULAR | Status: AC
  Filled 2017-10-26: qty 2

## 2017-10-26 MED ORDER — PROPOFOL 10 MG/ML IV BOLUS
INTRAVENOUS | Status: AC
  Filled 2017-10-26: qty 40

## 2017-10-26 MED ORDER — SODIUM CHLORIDE 0.9% FLUSH
INTRAVENOUS | Status: AC
  Filled 2017-10-26: qty 10

## 2017-10-26 MED ORDER — MIDAZOLAM HCL 2 MG/2ML IJ SOLN
INTRAMUSCULAR | Status: DC | PRN
  Administered 2017-10-26: 2 mg via INTRAVENOUS

## 2017-10-26 MED ORDER — LIDOCAINE VISCOUS 2 % MT SOLN
OROMUCOSAL | Status: DC | PRN
  Administered 2017-10-26: 6 mL via OROMUCOSAL

## 2017-10-26 NOTE — Anesthesia Postprocedure Evaluation (Signed)
Anesthesia Post Note  Patient: Christina [name]  Procedure(s) Performed: COLONOSCOPY WITH PROPOFOL (N/A ) ESOPHAGOGASTRODUODENOSCOPY (EGD) WITH PROPOFOL (N/A )  Patient location during evaluation: PACU Anesthesia Type: MAC Level of consciousness: awake, awake and alert, oriented and patient cooperative Pain management: pain level controlled Vital Signs Assessment: post-procedure vital signs reviewed and stable Respiratory status: spontaneous breathing, patient connected to nasal cannula oxygen, nonlabored ventilation and respiratory function stable Cardiovascular status: stable Postop Assessment: no apparent nausea or vomiting Anesthetic complications: no     Last Vitals:  Vitals:   10/26/17 1120 10/26/17 1125  BP:    Pulse:    Resp: 14 13  Temp:    SpO2: 99% 100%    Last Pain:  Vitals:   10/26/17 1100  TempSrc: Oral  PainSc: 4                  [name] L

## 2017-10-26 NOTE — Discharge Instructions (Signed)
YOUR ABDOMINAL PAIN/BLOATING IS DUE TO GASTRITIS, GASTROPARESIS, DAIRY INTAKE, and CONSTIPATION. YOUR RECTAL BLEEDING IN YOUR STOOL IS DUE TO SMALL internal hemorrhoids.  THE LAST PART OF YOUR SMALL BOWEL IS NORMAL. I BIOPSIED YOUR STOMACH, AND SMALL BOWEL.     DRINK WATER TO KEEP YOUR URINE LIGHT YELLOW.   FOLLOW A diabetic/gastroparesis/LACTOSE FREE DIET. AVOID ITEMS THAT CAUSE BLOATING. SEE INFO BELOW ON A LACTOSE FREE DIET.  CONTINUE YOUR WEIGHT LOSS EFFORTS.  WHILE I DO NOT WANT TO ALARM YOU, YOUR BODY MASS INDEX IS OVER 40 WHICH MEANS YOU ARE MORBIDLY OBESE. OBESITY IS ASSOCIATED WITH AN INCREASED RISK FOR CIRRHOSIS AND ALL CANCERS, INCLUDING ESOPHAGEAL AND COLON CANCER. A WEIGHT OF 230 LBS  WILL GET YOUR BODY MASS INDEX(BMI) UNDER 40. ULTIMATELY YOUR BMI SHOULD BE UNDER 30. A WEIGHT OF 175 LBS WILL GET YOUR BODY MASS INDEX(BMI) UNDER 30.   USE MILK OF MAGNESIA PILLS OR LIQUID THREE TIMES A DAY TO REDUCE CONSTIPATION. YOU CAN USE LINZESS 145 MCG TWO TABLETS WHEN NEEDED TO RESOLVE CONSTIPATION.  YOUR BIOPSY RESULTS WILL BE AVAILABLE IN 7 days.   PLEASE CALL WITH QUESTIONS OR CONCERNS.  FOLLOW UP IN 4 MOS.   ENDOSCOPY Care After Read the instructions outlined below and refer to this sheet in the next week. These discharge instructions provide you with general information on caring for yourself after you leave the hospital. While your treatment has been planned according to the most current medical practices available, unavoidable complications occasionally occur. If you have any problems or questions after discharge, call DR. Jandi Swiger, [phone].  ACTIVITY  You may resume your regular activity, but move at a slower pace for the next 24 hours.   Take frequent rest periods for the next 24 hours.   Walking will help get rid of the air and reduce the bloated feeling in your belly (abdomen).   No driving for 24 hours (because of the medicine (anesthesia) used during the test).    You may shower.   Do not sign any important legal documents or operate any machinery for 24 hours (because of the anesthesia used during the test).    NUTRITION  Drink plenty of fluids.   You may resume your normal diet as instructed by your doctor.   Begin with a light meal and progress to your normal diet. Heavy or fried foods are harder to digest and may make you feel sick to your stomach (nauseated).   Avoid alcoholic beverages for 24 hours or as instructed.    MEDICATIONS  You may resume your normal medications.   WHAT YOU CAN EXPECT TODAY  Some feelings of bloating in the abdomen.   Passage of more gas than usual.   Spotting of blood in your stool or on the toilet paper  .  IF YOU HAD POLYPS REMOVED DURING THE ENDOSCOPY:  Eat a soft diet IF YOU HAVE NAUSEA, BLOATING, ABDOMINAL PAIN, OR VOMITING.    FINDING OUT THE RESULTS OF YOUR TEST Not all test results are available during your visit. DR. Oneida Alar WILL CALL YOU WITHIN 14 DAYS OF YOUR PROCEDUE WITH YOUR RESULTS. Do not assume everything is normal if you have not heard from DR. Azyiah Bo, CALL HER OFFICE AT [phone].  SEEK IMMEDIATE MEDICAL ATTENTION AND CALL THE OFFICE: [phone] IF:  You have more than a spotting of blood in your stool.   Your belly is swollen (abdominal distention).   You are nauseated or vomiting.   You have a temperature  over 101F.   You have abdominal pain or discomfort that is severe or gets worse throughout the day.   Gastritis  Gastritis is an inflammation (the body's way of reacting to injury and/or infection) of the stomach. It is often caused by bacterial (germ) infections. It can also be caused BY ASPIRIN, BC/GOODY POWDER'S, (IBUPROFEN) MOTRIN, OR ALEVE (NAPROXEN), chemicals (including alcohol), SPICY FOODS, and medications. This illness may be associated with generalized malaise (feeling tired, not well), UPPER ABDOMINAL STOMACH cramps, and fever. One common bacterial  cause of gastritis is an organism known as H. Pylori. This can be treated with antibiotics.    Hemorrhoids Hemorrhoids are dilated (enlarged) veins around the rectum. Sometimes clots will form in the veins. This makes them swollen and painful. These are called thrombosed hemorrhoids.  Causes of hemorrhoids include:  Constipation.   Straining to have a bowel movement.   HEAVY LIFTING   HOME CARE INSTRUCTIONS  Eat a well balanced diet and drink 6 to 8 glasses of water every day to avoid constipation. You may also use a bulk laxative.   Avoid straining to have bowel movements.   Keep anal area dry and clean.   Do not use a donut shaped pillow or sit on the toilet for long periods. This increases blood pooling and pain.   Move your bowels when your body has the urge; this will require less straining and will decrease pain and pressure.

## 2017-10-26 NOTE — H&P (Addendum)
Primary Care Physician:  [name], MD Primary Gastroenterologist:  Dr. Oneida Alar  Pre-Procedure History & Physical: HPI:  Christina [name] is a 42 y.o. female here for BRBPR/DYSPEPSIA. Dexilant helps. FEELS HEARTBURN 1X/WEEK. PRETTY MUCH FOLLOWS GASTROPARESIS DIET. MAY have Hernia. Feels bloated. ON MIRALAX FOR A FEW DAYS. CONSTIPATION BEEN WORSE. STRAINING TO HAVE BM(YES). SAW RECTAL BLEEDING: 1-2X/WEEK, WHEN SHE WIPED.TRIED LINZESS BUT CAUSED BLOATING. MILK: GOOD AMOUNT. ICE CREAM: NO CHEESE: YES EVERY DAY.  Past Medical History:  Diagnosis Date  . Anemia   . Bipolar disorder ([place])   . Depression   . Diabetes mellitus    type 2 DM, insulin only needed during pregnancy  . Gastroparesis   . 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 GERD (gastroesophageal reflux disease)   . H/O acute sinusitis 10/2016  . Hypertension   . Thyroid enlargement   . Wears glasses     Past Surgical History:  Procedure Laterality Date  . BREAST REDUCTION SURGERY  1994  . CESAREAN SECTION N/A 03/06/2016   Procedure: CESAREAN SECTION;  Surgeon: Ena Dawley, MD;  Location: [place];  Service: Obstetrics;  Laterality: N/A;  . dermoid tumor  2000  . DILITATION & CURRETTAGE/HYSTROSCOPY WITH NOVASURE ABLATION N/A 12/10/2016   Procedure: DILATATION & CURETTAGE/HYSTEROSCOPY WITH NOVASURE ABLATION;  Surgeon: Ena Dawley, MD;  Location: [facility];  Service: Gynecology;  Laterality: N/A;  . ESOPHAGOGASTRODUODENOSCOPY  07/01/2009   NUU:VOZDGU esphagus without barrett's/dilation with 16 mm/mild erthyema in the antrum. mild chronic gastritis on path.   . TRIGGER FINGER RELEASE Bilateral 06/2015  . TUBAL LIGATION Bilateral 03/06/2016   Procedure: BILATERAL TUBAL LIGATION;  Surgeon: Ena Dawley, MD;  Location: [place];  Service: Obstetrics;  Laterality: Bilateral;    Prior to Admission medications   Medication Sig      acetaminophen (TYLENOL) 500 MG tablet Take  1,000 mg every 8 (eight) hours as needed by mouth for mild pain.       ARIPiprazole (ABILIFY) 5 MG tablet Take 1 tablet (5 mg total) by mouth daily. Patient taking differently: Take 5 mg by mouth every evening.       clindamycin (CLEOCIN) 300 MG capsule Take 1 capsule (300 mg total) by mouth 3 (three) times daily. Patient taking differently: Take 300 mg by mouth 4 (four) times daily.       dexlansoprazole (DEXILANT) 60 MG capsule Take 1 capsule (60 mg total) by mouth daily. Patient taking differently: Take 60 mg by mouth every evening.       fluticasone (FLONASE) 50 MCG/ACT nasal spray Place 2 sprays into both nostrils daily.      glipiZIDE (GLUCOTROL XL) 10 MG 24 hr tablet TAKE 2 TABLETS BY MOUTH  EVERY MORNING AT [facility] Patient taking differently: Take 20 mg by mouth daily with breakfast.       HYDROcodone-acetaminophen (NORCO/VICODIN) 5-325 MG tablet Take 1 tablet by mouth every 6 (six) hours as needed for moderate pain.      ibuprofen (ADVIL,MOTRIN) 200 MG tablet Take 600 mg by mouth every 8 (eight) hours as needed (FOR PAIN.).      Insulin Lispro Prot & Lispro (HUMALOG 75/25 MIX) (75-25) 100 UNIT/ML Kwikpen Inject 15 Units into the skin at bedtime. Patient taking differently: Inject 8 Units into the skin daily with supper.       loratadine (CLARITIN) 10 MG tablet Take 1 tablet (10 mg total) by mouth daily. Taking generic otc allergy Patient taking differently: Take  10 mg by mouth every evening. Taking generic otc allergy      metFORMIN (GLUCOPHAGE) 1000 MG tablet Take 1 tablet (1,000 mg total) by mouth 2 (two) times daily with a meal.      polyethylene glycol powder (GLYCOLAX/MIRALAX) powder Take 17 g by mouth daily. Patient taking differently: Take 17 g by mouth daily as needed (for constipation.).       sodium chloride (OCEAN) 0.65 % SOLN nasal spray Place 1 spray into both nostrils 4 (four) times daily as needed for congestion.      triamterene-hydrochlorothiazide (MAXZIDE-25) 37.5-25  MG tablet TAKE 1 TABLET BY MOUTH  EVERY DAY      valACYclovir (VALTREX) 500 MG tablet Take 1,000 mg by mouth daily as needed (for 3 days if needed for out breaks).       B-D UF III MINI PEN NEEDLES 31G X 5 MM MISC USE AS DIRECTED      fluconazole (DIFLUCAN) 150 MG tablet One tablet twice weekly , as needed, for vaginal itch associated with prolonged antibiotic use Patient not taking: Reported on 10/12/2017      glucose blood test strip Use as instructed, three times daily      losartan (COZAAR) 25 MG tablet Take 1 tablet (25 mg total) by mouth daily.      ONETOUCH DELICA LANCETS 54M MISC Three times daily testing dx e11.9        Allergies as of 09/21/2017 - Review Complete 09/21/2017  Allergen Reaction Noted  . Other Anaphylaxis 06/13/2013  . 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 Peanut-containing drug products Anaphylaxis 02/08/2013  . Ace inhibitors Cough 10/10/2010  . Invokana [canagliflozin] Other (See Comments) 04/18/2014  . Augmentin [amoxicillin-pot clavulanate] Nausea And Vomiting 04/07/2013    Family History  Problem Relation Age of Onset  . Diabetes Mother   . Hypertension Mother   . Fibromyalgia Mother   . Diabetes Father   . Hypertension Father   . Asthma Father   . Heart disease Father   . Colon cancer Neg Hx     Social History   Socioeconomic History  . Marital status: Married    Spouse name: Not on file  . Number of children: Not on file  . Years of education: Not on file  . Highest education level: Not on file  Occupational History  . 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 Financial resource strain: Not on file  . Food insecurity:    Worry: Not on file    Inability: Not on file  . Transportation needs:    Medical: Not on file    Non-medical: Not on file  Tobacco Use  . Smoking status: Never Smoker  . Smokeless tobacco: Never Used  . Tobacco comment: Never smoked   Substance and Sexual Activity  . Alcohol use: Yes     Alcohol/week: 0.0 oz    Comment: occ; had 2 glasses of wine last week while on vacation  . Drug use: No  . Sexual activity: Yes    Partners: Male    Birth control/protection: Rhythm    Comment: Last Intercourse about 10 days ago  Lifestyle  . Physical activity:    Days per week: Not on file    Minutes per session: Not on file  . Stress: Not on file  Relationships  . Social connections:    Talks on phone: Not on file    Gets together: Not on file    Attends religious service:  Not on file    Active member of club or organization: Not on file    Attends meetings of clubs or organizations: Not on file    Relationship status: Not on file  . Intimate partner violence:    Fear of current or ex partner: Not on file    Emotionally abused: Not on file    Physically abused: Not on file    Forced sexual activity: Not on file  Other Topics Concern  . Not on file  Social History Narrative  . Not on file    Review of Systems: See HPI, otherwise negative ROS   Physical Exam: BP 129/86   Pulse 75   Temp 98.3 F (36.8 C) (Oral)   Resp 13   SpO2 100%  General:   Alert,  pleasant and cooperative in NAD Head:  Normocephalic and atraumatic. Neck:  Supple; Lungs:  Clear throughout to auscultation.    Heart:  Regular rate and rhythm. Abdomen:  Soft, nontender and nondistended. Normal bowel sounds, without guarding, and without rebound.   Neurologic:  Alert and  oriented x4;  grossly normal neurologically.  Impression/Plan:     BRBPR/DYSPEPSIA.  PLAN: EGD/TCS TODAY DISCUSSED PROCEDURE, BENEFITS, & RISKS: < 1% chance of medication reaction, bleeding, perforation, or rupture of spleen/liver.

## 2017-10-26 NOTE — Transfer of Care (Signed)
Immediate Anesthesia Transfer of Care Note  Patient: Christina [name]  Procedure(s) Performed: COLONOSCOPY WITH PROPOFOL (N/A ) ESOPHAGOGASTRODUODENOSCOPY (EGD) WITH PROPOFOL (N/A )  Patient Location: PACU  Anesthesia Type:MAC  Level of Consciousness: awake, alert , oriented and patient cooperative  Airway & Oxygen Therapy: Patient Spontanous Breathing and Patient connected to nasal cannula oxygen  Post-op Assessment: Report given to RN and Post -op Vital signs reviewed and stable  Post vital signs: Reviewed and stable  Last Vitals:  Vitals Value Taken Time  BP 108/87 10/26/2017  1:18 PM  Temp    Pulse 81 10/26/2017  1:20 PM  Resp 12 10/26/2017  1:20 PM  SpO2 100 % 10/26/2017  1:20 PM  Vitals shown include unvalidated device data.  Last Pain:  Vitals:   10/26/17 1100  TempSrc: Oral  PainSc: 4       Patients Stated Pain Goal: 8 (57/01/77 9390)  Complications: No apparent anesthesia complications

## 2017-10-26 NOTE — Anesthesia Preprocedure Evaluation (Signed)
Anesthesia Evaluation  Patient identified by MRN, date of birth, ID band Patient awake    Reviewed: Allergy & Precautions, NPO status , Patient's Chart, lab work & pertinent test results  History of Anesthesia Complications Negative for: history of anesthetic complications  Airway Mallampati: II  TM Distance: >3 FB Neck ROM: Full    Dental  (+) Missing, Dental Advisory Given, Teeth Intact   Pulmonary neg pulmonary ROS,    breath sounds clear to auscultation       Cardiovascular hypertension, Pt. on medications  Rhythm:Regular Rate:Normal     Neuro/Psych PSYCHIATRIC DISORDERS Depression Bipolar Disorder negative neurological ROS     GI/Hepatic Neg liver ROS, GERD  Medicated and Controlled,  Endo/Other  diabetes, Type 2, Insulin DependentMorbid obesity  Renal/GU negative Renal ROS     Musculoskeletal   Abdominal (+) + obese,   Peds  Hematology negative hematology ROS (+) anemia ,   Anesthesia Other Findings   Reproductive/Obstetrics                             Anesthesia Physical Anesthesia Plan  ASA: II  Anesthesia Plan: MAC   Post-op Pain Management:    Induction: Intravenous  PONV Risk Score and Plan:   Airway Management Planned: Simple Face Mask  Additional Equipment:   Intra-op Plan:   Post-operative Plan:   Informed Consent: I have reviewed the patients History and Physical, chart, labs and discussed the procedure including the risks, benefits and alternatives for the proposed anesthesia with the patient or authorized representative who has indicated his/her understanding and acceptance.     Plan Discussed with:   Anesthesia Plan Comments:         Anesthesia Quick Evaluation

## 2017-10-26 NOTE — Op Note (Signed)
[facility] Patient Name: Christina [name] Procedure Date: 10/26/2017 12:00 PM MRN: [id number] Date of Birth: 03-Apr-1976 Attending MD: Barney Drain MD, MD CSN: [id number] Age: 42 Admit Type: Outpatient Procedure:                Colonoscopy, DIAGNOSTIC Indications:              Hematochezia Providers:                Barney Drain MD, MD, Janeece Riggers, RN, Aram Candela Referring MD:             Norwood Levo. Simpson MD, MD Medicines:                Propofol per Anesthesia Complications:            No immediate complications. Estimated Blood Loss:     Estimated blood loss: none. Procedure:                Pre-Anesthesia Assessment:                           - Prior to the procedure, a History and Physical                            was performed, and patient medications and                            allergies were reviewed. The patient's tolerance of                            previous anesthesia was also reviewed. The risks                            and benefits of the procedure and the sedation                            options and risks were discussed with the patient.                            All questions were answered, and informed consent                            was obtained. Prior Anticoagulants: The patient has                            taken no previous anticoagulant or antiplatelet                            agents. ASA Grade Assessment: II - A patient with                            mild systemic disease. After reviewing the risks                            and benefits, the patient was deemed in  satisfactory condition to undergo the procedure.                           After obtaining informed consent, the colonoscope                            was passed under direct vision. Throughout the                            procedure, the patient's blood pressure, pulse, and                            oxygen saturations were monitored continuously. The                             [id number] ([id number]) scope was introduced through                            the anus and advanced to the 5 cm into the ileum.                            The colonoscopy was somewhat difficult due to a                            tortuous colon. Successful completion of the                            procedure was aided by increasing the dose of                            sedation medication, straightening and shortening                            the scope to obtain bowel loop reduction and                            COLOWRAP. The patient tolerated the procedure well.                            The quality of the bowel preparation was good. The                            terminal ileum, ileocecal valve, appendiceal                            orifice, and rectum were photographed. Scope In: 12:38:34 PM Scope Out: 12:59:08 PM Scope Withdrawal Time: 0 hours 15 minutes 45 seconds  Total Procedure Duration: 0 hours 20 minutes 34 seconds  Findings:      The terminal ileum appeared normal.      The recto-sigmoid colon, sigmoid colon and transverse colon were       significantly tortuous.      The exam was otherwise without abnormality.      Internal hemorrhoids were found during retroflexion. The hemorrhoids  were small. Impression:               - The examined portion of the ileum was normal.                           - Tortuous TRANSVERSE AND SIGMOID colon.                           - The examination was otherwise normal.                           - RECTAL BLEEDING DUE TO Internal hemorrhoids. Moderate Sedation:      Per Anesthesia Care Recommendation:           - Repeat colonoscopy in 10 years for surveillance                            W/ MAC/COLOWRAP.                           - DRINK WATER. FOLLOW GASTROPARESIS/DIABETIC/High                            fiber diet AS TOLERATED.                           - Continue present medications AND WEIGHT LOSS                             EFFORT.                           - Return to my office in 4 months.                           - Patient has a contact number available for                            emergencies. The signs and symptoms of potential                            delayed complications were discussed with the                            patient. Return to normal activities tomorrow.                            Written discharge instructions were provided to the                            patient. Procedure Code(s):        --- Professional ---                           [phone], Colonoscopy, flexible; diagnostic, including  collection of specimen(s) by brushing or washing,                            when performed (separate procedure) Diagnosis Code(s):        --- Professional ---                           K64.8, Other hemorrhoids                           K92.1, Melena (includes Hematochezia)                           Q43.8, Other specified congenital malformations of                            intestine CPT copyright 2016 [employer]. All rights reserved. The codes documented in this report are preliminary and upon coder review may  be revised to meet current compliance requirements. Barney Drain, MD Barney Drain MD, MD 10/26/2017 1:30:38 PM This report has been signed electronically. Number of Addenda: 0

## 2017-10-26 NOTE — Op Note (Signed)
[facility] Patient Name: Christina [name] Procedure Date: 10/26/2017 1:01 PM MRN: [id number] Date of Birth: [birth year] Attending MD: Barney Drain MD, MD CSN: [id number] Age: 42 Admit Type: Outpatient Procedure:                Upper GI endoscopy WITH COLD FORCEPS BIOPSY Indications:              Epigastric abdominal pain, Dyspepsia. USES                            IBUPROFEN. EATS CHEESE DAILY AND DRINKS MILK > 4-5                            DAYS A WEEK. Providers:                Barney Drain MD, MD, Janeece Riggers, RN, Aram Candela Referring MD:             Norwood Levo. Simpson MD, MD Medicines:                Propofol per Anesthesia Complications:            No immediate complications. Estimated Blood Loss:     Estimated blood loss was minimal. Procedure:                Pre-Anesthesia Assessment:                           - Prior to the procedure, a History and Physical                            was performed, and patient medications and                            allergies were reviewed. The patient's tolerance of                            previous anesthesia was also reviewed. The risks                            and benefits of the procedure and the sedation                            options and risks were discussed with the patient.                            All questions were answered, and informed consent                            was obtained. Prior Anticoagulants: The patient has                            taken no previous anticoagulant or antiplatelet                            agents. ASA Grade Assessment: II - A patient with  mild systemic disease. After reviewing the risks                            and benefits, the patient was deemed in                            satisfactory condition to undergo the procedure.                           After obtaining informed consent, the endoscope was                            passed under direct vision.  Throughout the                            procedure, the patient's blood pressure, pulse, and                            oxygen saturations were monitored continuously. The                            EG29-[id number] ([id number]) scope was introduced through the                            mouth, and advanced to the second part of duodenum.                            The upper GI endoscopy was accomplished without                            difficulty. The patient tolerated the procedure                            well. Scope In: 1:03:46 PM Scope Out: 1:13:20 PM Total Procedure Duration: 0 hours 9 minutes 34 seconds  Findings:      The examined esophagus was normal.      Patchy mild inflammation characterized by congestion (edema) and       erythema was found in the gastric antrum. Biopsies were taken with a       cold forceps for Helicobacter pylori testing.      Multiple small sessile polyps with no stigmata of recent bleeding were       found in the gastric body and on the greater curvature of the stomach.       The polyp was removed with a cold biopsy forceps. Resection and       retrieval were complete.      The examined duodenum was normal. Biopsies for histology were taken with       a cold forceps for evaluation of celiac disease. Impression:               - EPIGASTRIC PAIN DUE TO MILD NSAID Gastritis/GERD.                           - Multiple gastric polyps.                           -  BLOATIN DUE TO CONSTIPATION/GASTROPARESIS AND                            DAIRY INTAKE. Moderate Sedation:      Per Anesthesia Care Recommendation:           - Await pathology results.                           - Continue present medications.                           - Diabetic (ADA) diet and gastroparesis/LACTOSE                            FREE diet.                           - Return to my office in 4 months.                           - Patient has a contact number available for                             emergencies. The signs and symptoms of potential                            delayed complications were discussed with the                            patient. Return to normal activities tomorrow.                            Written discharge instructions were provided to the                            patient. Procedure Code(s):        --- Professional ---                           [phone], Esophagogastroduodenoscopy, flexible,                            transoral; with biopsy, single or multiple Diagnosis Code(s):        --- Professional ---                           K29.70, Gastritis, unspecified, without bleeding                           K31.7, Polyp of stomach and duodenum                           R10.13, Epigastric pain CPT copyright 2016 American Medical Association. All rights reserved. The codes documented in this report are preliminary and upon coder review may  be revised to meet current compliance requirements. Barney Drain, MD Barney Drain MD, MD 10/26/2017 1:35:27 PM This  report has been signed electronically. Number of Addenda: 0

## 2017-10-28 ENCOUNTER — Other Ambulatory Visit: Payer: Self-pay

## 2017-10-29 ENCOUNTER — Encounter (HOSPITAL_COMMUNITY): Payer: Self-pay | Admitting: Gastroenterology

## 2017-11-01 NOTE — Progress Notes (Signed)
LMOM to call.

## 2017-11-01 NOTE — Progress Notes (Signed)
cc'd to pcp 

## 2017-11-01 NOTE — Progress Notes (Signed)
Pt is aware.  

## 2017-11-02 ENCOUNTER — Ambulatory Visit: Payer: Self-pay | Admitting: Gastroenterology

## 2017-12-03 DIAGNOSIS — R05 Cough: Secondary | ICD-10-CM | POA: Diagnosis not present

## 2017-12-03 DIAGNOSIS — J00 Acute nasopharyngitis [common cold]: Secondary | ICD-10-CM | POA: Diagnosis not present

## 2017-12-05 ENCOUNTER — Encounter: Payer: Self-pay | Admitting: Family Medicine

## 2017-12-06 ENCOUNTER — Ambulatory Visit (HOSPITAL_COMMUNITY): Payer: Self-pay | Admitting: Psychiatry

## 2017-12-06 ENCOUNTER — Ambulatory Visit: Payer: Self-pay | Admitting: Family Medicine

## 2017-12-06 DIAGNOSIS — R0981 Nasal congestion: Secondary | ICD-10-CM | POA: Diagnosis not present

## 2017-12-06 DIAGNOSIS — J309 Allergic rhinitis, unspecified: Secondary | ICD-10-CM | POA: Diagnosis not present

## 2017-12-09 ENCOUNTER — Other Ambulatory Visit: Payer: Self-pay | Admitting: Family Medicine

## 2017-12-09 MED ORDER — INSULIN GLARGINE 100 UNIT/ML SOLOSTAR PEN: 10.0000 [IU] | PEN_INJECTOR | Freq: Every day | SUBCUTANEOUS | 11 refills | Status: DC

## 2017-12-20 ENCOUNTER — Encounter: Payer: Self-pay | Admitting: Family Medicine

## 2017-12-21 ENCOUNTER — Other Ambulatory Visit: Payer: Self-pay | Admitting: Family Medicine

## 2017-12-28 ENCOUNTER — Telehealth: Payer: Self-pay

## 2017-12-28 DIAGNOSIS — Z0184 Encounter for antibody response examination: Secondary | ICD-10-CM | POA: Diagnosis not present

## 2017-12-28 DIAGNOSIS — Z111 Encounter for screening for respiratory tuberculosis: Secondary | ICD-10-CM | POA: Diagnosis not present

## 2017-12-28 NOTE — Telephone Encounter (Signed)
I received a notice that Dexilant needs PA. I called pt to find out what she has taken other than Prilosec before Dexilant. She said Prilosec and Protonix. She said she is taking Prilosec OTC again once a day because the Dexilant was too expensive. She said she is doing well on Prilosec now. Sending FYI to Neil Crouch, PA who saw her in the office last on 09/21/2017.

## 2017-12-28 NOTE — Telephone Encounter (Signed)
She can continue Prilosec OTC. If she wants RX for generic omeprazole let me know.

## 2017-12-29 ENCOUNTER — Ambulatory Visit (HOSPITAL_COMMUNITY): Payer: BLUE CROSS/BLUE SHIELD | Admitting: Psychiatry

## 2017-12-29 ENCOUNTER — Encounter (HOSPITAL_COMMUNITY): Payer: Self-pay | Admitting: Psychiatry

## 2017-12-29 DIAGNOSIS — H524 Presbyopia: Secondary | ICD-10-CM | POA: Diagnosis not present

## 2017-12-29 DIAGNOSIS — Z794 Long term (current) use of insulin: Secondary | ICD-10-CM | POA: Diagnosis not present

## 2017-12-29 DIAGNOSIS — F3162 Bipolar disorder, current episode mixed, moderate: Secondary | ICD-10-CM | POA: Diagnosis not present

## 2017-12-29 DIAGNOSIS — H5213 Myopia, bilateral: Secondary | ICD-10-CM | POA: Diagnosis not present

## 2017-12-29 DIAGNOSIS — H52203 Unspecified astigmatism, bilateral: Secondary | ICD-10-CM | POA: Diagnosis not present

## 2017-12-29 DIAGNOSIS — E119 Type 2 diabetes mellitus without complications: Secondary | ICD-10-CM | POA: Diagnosis not present

## 2017-12-29 LAB — HM DIABETES EYE EXAM

## 2017-12-29 MED ORDER — OMEPRAZOLE 20 MG PO CPDR: 20.0000 mg | DELAYED_RELEASE_CAPSULE | Freq: Every day | ORAL | 11 refills | Status: DC

## 2017-12-29 MED ORDER — ARIPIPRAZOLE 5 MG PO TABS: 5.0000 mg | ORAL_TABLET | Freq: Every evening | ORAL | 0 refills | Status: DC

## 2017-12-29 NOTE — Telephone Encounter (Signed)
[facility] Rx sent in.

## 2017-12-29 NOTE — Addendum Note (Signed)
Addended by: Mahala Menghini on: 12/29/2017 10:10 AM   Modules accepted: Orders

## 2017-12-29 NOTE — Progress Notes (Signed)
BH MD/PA/NP OP Progress Note  12/29/2017 12:53 PM Christina [name]  MRN:  [id number]  Chief Complaint: I am doing good.  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  HPI: Patient came for her follow-up appointment.  She is taking Abilify as prescribed.  She is doing very well.  Recently she was busy taking her children to the doctor's appointment because of group.  She feel Abilify working.  She denies any irritability, anger, mania, psychosis.  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  She really like her job.  Her sleep is good.  She had a recently colonoscopy and she was told everything is fine.  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  She is hoping to drop her blood sugar.  She also started exercise every day.  Patient denies drinking or using any illegal substances.  Her energy level is good.  She denies any crying spells or any feeling of hopelessness or worthlessness.  Patient lives with her husband and 3 children.  Visit Diagnosis:    ICD-10-CM   1. Bipolar 1 disorder, mixed, moderate (HCC) F31.62 ARIPiprazole (ABILIFY) 5 MG tablet    Past Psychiatric History: Reviewed. Patient has been seeing in this office since 2006. She was referred from her primary care physician Dr. Moshe Cipro. She has history of depression and mania and psychosis. In 1997 she was admitted at [facility] because of suicidal gesture. In the past she has taken Prozac and Wellbutrin. She denies any history of sexual, verbal emotional abuse.   Past Medical History:  Past Medical History:  Diagnosis Date  . Anemia   . Bipolar disorder ([place])   . Depression   . Diabetes mellitus    type 2 DM, insulin only needed during pregnancy  . Gastroparesis   . 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 GERD (gastroesophageal reflux disease)   . H/O acute sinusitis 10/2016  .  Hypertension   . Thyroid enlargement   . Wears glasses     Past Surgical History:  Procedure Laterality Date  . BREAST REDUCTION SURGERY  1994  . CESAREAN SECTION N/A 03/06/2016   Procedure: CESAREAN SECTION;  Surgeon: Ena Dawley, MD;  Location: [place];  Service: Obstetrics;  Laterality: N/A;  . COLONOSCOPY WITH PROPOFOL N/A 10/26/2017   Procedure: COLONOSCOPY WITH PROPOFOL;  Surgeon: Danie Binder, MD;  [facility];  Service: Endoscopy;  Laterality: N/A;  12:45pm  . dermoid tumor  2000  . DILITATION & CURRETTAGE/HYSTROSCOPY WITH NOVASURE ABLATION N/A 12/10/2016   Procedure: DILATATION & CURETTAGE/HYSTEROSCOPY WITH NOVASURE ABLATION;  Surgeon: Ena Dawley, MD;  Location[facility];  Service: Gynecology;  Laterality: N/A;  . ESOPHAGOGASTRODUODENOSCOPY  07/01/2009   [id number] esphagus without barrett's/dilation with 16 mm/mild erthyema in the antrum. mild chronic gastritis on path.   . ESOPHAGOGASTRODUODENOSCOPY (EGD) WITH PROPOFOL N/A 10/26/2017   Procedure: ESOPHAGOGASTRODUODENOSCOPY (EGD) WITH PROPOFOL;  Surgeon: Danie Binder, MD;  [facility];  Service: Endoscopy;  Laterality: N/A;  . TRIGGER FINGER RELEASE Bilateral 06/2015  . TUBAL LIGATION Bilateral 03/06/2016   Procedure: BILATERAL TUBAL LIGATION;  Surgeon: Ena Dawley, MD;  Location: [place];  Service: Obstetrics;  Laterality: Bilateral;    Family Psychiatric History: Reviewed.  Family History:  Family History  Problem  Relation Age of Onset  . Diabetes Mother   . Hypertension Mother   . Fibromyalgia Mother   . Diabetes Father   . Hypertension Father   . Asthma Father   . Heart disease Father   . Colon cancer Neg Hx     Social History:  Social History   Socioeconomic History  . Marital status: Married    Spouse name: Not on file  . Number of children: Not on file  . Years of education: Not on file  . Highest education level: Not on file   Occupational History  . 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 Financial resource strain: Not on file  . Food insecurity:    Worry: Not on file    Inability: Not on file  . Transportation needs:    Medical: Not on file    Non-medical: Not on file  Tobacco Use  . Smoking status: Never Smoker  . Smokeless tobacco: Never Used  . Tobacco comment: Never smoked   Substance and Sexual Activity  . Alcohol use: Yes    Alcohol/week: 0.0 oz    Comment: occ; had 2 glasses of wine last week while on vacation  . Drug use: No  . Sexual activity: Yes    Partners: Male    Birth control/protection: Rhythm    Comment: Last Intercourse about 10 days ago  Lifestyle  . Physical activity:    Days per week: Not on file    Minutes per session: Not on file  . Stress: Not on file  Relationships  . Social connections:    Talks on phone: Not on file    Gets together: Not on file    Attends religious service: Not on file    Active member of club or organization: Not on file    Attends meetings of clubs or organizations: Not on file    Relationship status: Not on file  Other Topics Concern  . Not on file  Social History Narrative  . Not on file    Allergies:  Allergies  Allergen Reactions  . Other Anaphylaxis    Tree nuts  . Peanut Oil Anaphylaxis  . Peanut-Containing Drug Products Anaphylaxis  . Augmentin [Amoxicillin-Pot Clavulanate] Nausea And Vomiting    Causes stomach cramps. Patient can take amoxicillin, not Augmentin. Has patient had a PCN reaction causing immediate rash, facial/tongue/throat swelling, SOB or lightheadedness with hypotension: No Has patient had a PCN reaction causing severe rash involving mucus membranes or skin necrosis: No Has patient had a PCN reaction that required hospitalization: No Has patient had a PCN reaction occurring within the last 10 years: No If all of the above answers are "NO", then may proceed with Cephalospori    Metabolic  Disorder Labs: Recent Results (from the past 2160 hour(s))  CMP14+EGFR     Status: Abnormal   Collection Time: 10/04/17  8:52 AM  Result Value Ref Range   Glucose 128 (H) 65 - 99 mg/dL   BUN 9 6 - 24 mg/dL   Creatinine, Ser 0.86 0.57 - 1.00 mg/dL   GFR calc non Af Amer 84 >59 mL/min/1.73   GFR calc Af Amer 97 >59 mL/min/1.73   BUN/Creatinine Ratio 10 9 - 23   Sodium 141 134 - 144 mmol/L   Potassium 4.5 3.5 - 5.2 mmol/L   Chloride 101 96 - 106 mmol/L   CO2 25 20 - 29 mmol/L   Calcium 9.9 8.7 - 10.2 mg/dL   Total  Protein 7.3 6.0 - 8.5 g/dL   Albumin 4.6 3.5 - 5.5 g/dL   Globulin, Total 2.7 1.5 - 4.5 g/dL   Albumin/Globulin Ratio 1.7 1.2 - 2.2   Bilirubin Total 0.3 0.0 - 1.2 mg/dL   Alkaline Phosphatase 56 39 - 117 IU/L   AST 9 0 - 40 IU/L   ALT 11 0 - 32 IU/L  Lipid panel     Status: None   Collection Time: 10/04/17  8:52 AM  Result Value Ref Range   Cholesterol, Total 176 100 - 199 mg/dL   Triglycerides 120 0 - 149 mg/dL   HDL 64 >39 mg/dL   VLDL Cholesterol Cal 24 5 - 40 mg/dL   LDL Calculated 88 0 - 99 mg/dL   Chol/HDL Ratio 2.8 0.0 - 4.4 ratio    Comment:                                   T. Chol/HDL Ratio                                             Men  Women                               1/2 Avg.Risk  3.4    3.3                                   Avg.Risk  5.0    4.4                                2X Avg.Risk  9.6    7.1                                3X Avg.Risk 23.4   11.0   Hemoglobin A1c     Status: Abnormal   Collection Time: 10/04/17  8:52 AM  Result Value Ref Range   Hgb A1c MFr Bld 8.3 (H) 4.8 - 5.6 %    Comment:          Prediabetes: 5.7 - 6.4          Diabetes: >6.4          Glycemic control for adults with diabetes: <7.0    Est. average glucose Bld gHb Est-mCnc 192 mg/dL  CBC with Differential/Platelet     Status: None   Collection Time: 10/20/17 10:28 AM  Result Value Ref Range   WBC 4.8 4.0 - 10.5 K/uL   RBC 4.01 3.87 - 5.11 MIL/uL   Hemoglobin  12.3 12.0 - 15.0 g/dL   HCT 37.9 36.0 - 46.0 %   MCV 94.5 78.0 - 100.0 fL   MCH 30.7 26.0 - 34.0 pg   MCHC 32.5 30.0 - 36.0 g/dL   RDW 13.4 11.5 - 15.5 %   Platelets 333 150 - 400 K/uL   Neutrophils Relative % 44 %   Neutro Abs 2.1 1.7 - 7.7 K/uL   Lymphocytes Relative 47 %   Lymphs Abs 2.3 0.7 - 4.0 K/uL   Monocytes  Relative 6 %   Monocytes Absolute 0.3 0.1 - 1.0 K/uL   Eosinophils Relative 3 %   Eosinophils Absolute 0.1 0.0 - 0.7 K/uL   Basophils Relative 0 %   Basophils Absolute 0.0 0.0 - 0.1 K/uL    Comment: Performed at [facility], [address] [place], [place] [address]  hCG, serum, qualitative     Status: None   Collection Time: 10/20/17 10:36 AM  Result Value Ref Range   Preg, Serum NEGATIVE NEGATIVE    Comment:        THE SENSITIVITY OF THIS METHODOLOGY IS >10 mIU/mL. Performed at [facility], [address], [place], [place] [address]   Glucose, capillary     Status: Abnormal   Collection Time: 10/26/17 10:40 AM  Result Value Ref Range   Glucose-Capillary 149 (H) 65 - 99 mg/dL  Glucose, capillary     Status: Abnormal   Collection Time: 10/26/17  1:22 PM  Result Value Ref Range   Glucose-Capillary 109 (H) 65 - 99 mg/dL   Lab Results  Component Value Date   HGBA1C 8.3 (H) 10/04/2017   MPG 154 06/12/2016   MPG 137 03/16/2016   No results found for: PROLACTIN Lab Results  Component Value Date   CHOL 176 10/04/2017   TRIG 120 10/04/2017   HDL 64 10/04/2017   CHOLHDL 2.8 10/04/2017   VLDL 26 03/16/2016   LDLCALC 88 10/04/2017   LDLCALC 48 07/07/2017   Lab Results  Component Value Date   TSH 0.822 03/20/2017   TSH 0.79 03/16/2016    Therapeutic Level Labs: No results found for: LITHIUM No results found for: VALPROATE No components found for:  CBMZ  Current Medications: Current Outpatient Medications  Medication Sig Dispense Refill  . acetaminophen (TYLENOL) 500 MG tablet Take 1,000 mg every 8 (eight) hours as needed by mouth for mild pain.      . ARIPiprazole (ABILIFY) 5 MG tablet Take 1 tablet (5 mg total) by mouth daily. (Patient taking differently: Take 5 mg by mouth every evening. ) 90 tablet 0  . B-D UF III MINI PEN NEEDLES 31G X 5 MM MISC USE AS DIRECTED 100 each 2  . fluconazole (DIFLUCAN) 150 MG tablet One tablet twice weekly , as needed, for vaginal itch associated with prolonged antibiotic use (Patient not taking: Reported on 10/12/2017) 6 tablet 0  . fluticasone (FLONASE) 50 MCG/ACT nasal spray Place 2 sprays into both nostrils daily. 16 g 6  . glipiZIDE (GLUCOTROL XL) 10 MG 24 hr tablet TAKE 2 TABLETS BY MOUTH  EVERY MORNING AT [facility] (Patient taking differently: Take 20 mg by mouth daily with breakfast. ) 180 tablet 1  . glucose blood test strip Use as instructed, three times daily 300 each 12  . HYDROcodone-acetaminophen (NORCO/VICODIN) 5-325 MG tablet Take 1 tablet by mouth every 6 (six) hours as needed for moderate pain.    Marland Kitchen ibuprofen (ADVIL,MOTRIN) 200 MG tablet Take 600 mg by mouth every 8 (eight) hours as needed (FOR PAIN.).    . Insulin Glargine (LANTUS) 100 UNIT/ML Solostar Pen Inject 10 Units into the skin daily at 10 pm. 15 mL 11  . loratadine (CLARITIN) 10 MG tablet Take 1 tablet (10 mg total) by mouth daily. Taking generic otc allergy (Patient taking differently: Take 10 mg by mouth every evening. Taking generic otc allergy) 90 tablet 1  . losartan (COZAAR) 25 MG tablet Take 1 tablet (25 mg total) by mouth daily. 90 tablet 0  . metFORMIN (GLUCOPHAGE)  1000 MG tablet Take 1 tablet (1,000 mg total) by mouth 2 (two) times daily with a meal. 180 tablet 3  . omeprazole (PRILOSEC) 20 MG capsule Take 1 capsule (20 mg total) by mouth daily before breakfast. 30 capsule 11  . ONETOUCH DELICA LANCETS 89V MISC Three times daily testing dx e11.9 300 each 5  . polyethylene glycol powder (GLYCOLAX/MIRALAX) powder Take 17 g by mouth daily. (Patient taking differently: Take 17 g by mouth daily as needed (for constipation.). ) 255 g  0  . sodium chloride (OCEAN) 0.65 % SOLN nasal spray Place 1 spray into both nostrils 4 (four) times daily as needed for congestion.    . triamterene-hydrochlorothiazide (MAXZIDE-25) 37.5-25 MG tablet TAKE 1 TABLET BY MOUTH  EVERY DAY 90 tablet 0  . valACYclovir (VALTREX) 500 MG tablet Take 1,000 mg by mouth daily as needed (for 3 days if needed for out breaks).      No current facility-administered medications for this visit.      Musculoskeletal: Strength & Muscle Tone: within normal limits Gait & Station: normal Patient leans: N/A  Psychiatric Specialty Exam: ROS  Blood pressure 130/78, pulse (!) 101, height _0  (1.651 m), weight 240 lb (108.9 kg), unknown if currently breastfeeding.There is no height or weight on file to calculate BMI.  General Appearance: Casual  Eye Contact:  Good  Speech:  Clear and Coherent  Volume:  Normal  Mood:  Euthymic  Affect:  Appropriate  Thought Process:  Goal Directed  Orientation:  Full (Time, Place, and Person)  Thought Content: Logical   Suicidal Thoughts:  No  Homicidal Thoughts:  No  Memory:  Immediate;   Good Recent;   Good Remote;   Good  Judgement:  Good  Insight:  Good  Psychomotor Activity:  Normal  Concentration:  Concentration: Good and Attention Span: Good  Recall:  Good  Fund of Knowledge: Good  Language: Good  Akathisia:  No  Handed:  Right  AIMS (if indicated): not done  Assets:  Communication Skills Desire for Improvement Housing Social Support Talents/Skills  ADL's:  Intact  Cognition: WNL  Sleep:  Good   Screenings: PHQ2-9     Office Visit from 08/17/2017 in [facility] Office Visit from 05/11/2017 in [facility] Office Visit from 10/16/2016 in [facility] Office Visit from 06/01/2016 in [facility] Nutrition from 10/03/2015 in [facility]  PHQ-2 Total Score  0  0  0  0  0       Assessment and Plan: Bipolar disorder type  I.  Patient doing better on her current medication.  I reviewed blood work results and recent hemoglobin A1c.  Her hemoglobin A1c is 8.4.  However she is hoping to lose weight and not taking insulin may drop her hemoglobin A1c.  Patient is not interested in counseling.  Recommended to call us back if she has any question or any concern.  I will continue Abilify 5 mg daily.  Patient has no tremors, shakes or any EPS.  Follow-up in 3 months   Kathlee Nations, MD 12/29/2017, 12:53 PM

## 2017-12-29 NOTE — Telephone Encounter (Signed)
PT said she would like an Rx sent.

## 2017-12-29 NOTE — Telephone Encounter (Signed)
Sent to pharmacy on file, [place].

## 2017-12-30 ENCOUNTER — Encounter: Payer: Self-pay | Admitting: Family Medicine

## 2018-01-10 ENCOUNTER — Encounter: Payer: Self-pay | Admitting: Family Medicine

## 2018-01-21 ENCOUNTER — Encounter: Payer: Self-pay | Admitting: Family Medicine

## 2018-01-24 ENCOUNTER — Telehealth: Payer: Self-pay

## 2018-01-24 DIAGNOSIS — E669 Obesity, unspecified: Principal | ICD-10-CM

## 2018-01-24 DIAGNOSIS — E1169 Type 2 diabetes mellitus with other specified complication: Secondary | ICD-10-CM

## 2018-01-24 NOTE — Telephone Encounter (Signed)
Labs sent to [facility]

## 2018-01-25 DIAGNOSIS — E669 Obesity, unspecified: Secondary | ICD-10-CM | POA: Diagnosis not present

## 2018-01-25 DIAGNOSIS — E1169 Type 2 diabetes mellitus with other specified complication: Secondary | ICD-10-CM | POA: Diagnosis not present

## 2018-01-26 ENCOUNTER — Encounter: Payer: Self-pay | Admitting: Family Medicine

## 2018-01-26 LAB — CMP14+EGFR
ALT: 14 IU/L (ref 0–32)
AST: 12 IU/L (ref 0–40)
Albumin/Globulin Ratio: 1.7 (ref 1.2–2.2)
Albumin: 4.2 g/dL (ref 3.5–5.5)
Alkaline Phosphatase: 59 IU/L (ref 39–117)
BUN/Creatinine Ratio: 10 (ref 9–23)
BUN: 8 mg/dL (ref 6–24)
Bilirubin Total: 0.3 mg/dL (ref 0.0–1.2)
CO2: 25 mmol/L (ref 20–29)
Calcium: 9.3 mg/dL (ref 8.7–10.2)
Chloride: 103 mmol/L (ref 96–106)
Creatinine, Ser: 0.81 mg/dL (ref 0.57–1.00)
GFR calc Af Amer: 104 mL/min/{1.73_m2} (ref >59)
GFR calc non Af Amer: 90 mL/min/{1.73_m2} (ref >59)
Globulin, Total: 2.5 g/dL (ref 1.5–4.5)
Glucose: 121 mg/dL — ABNORMAL HIGH (ref 65–99)
Potassium: 4.2 mmol/L (ref 3.5–5.2)
Sodium: 140 mmol/L (ref 134–144)
Total Protein: 6.7 g/dL (ref 6.0–8.5)

## 2018-01-26 LAB — HEMOGLOBIN A1C
Est. average glucose Bld gHb Est-mCnc: 197 mg/dL
Hgb A1c MFr Bld: 8.5 % — ABNORMAL HIGH (ref 4.8–5.6)

## 2018-01-27 ENCOUNTER — Encounter: Payer: Self-pay | Admitting: Family Medicine

## 2018-01-27 ENCOUNTER — Ambulatory Visit: Payer: BLUE CROSS/BLUE SHIELD | Admitting: Family Medicine

## 2018-01-27 VITALS — BP 124/82 | HR 98 | Resp 16 | Ht 64.5 in | Wt 242.0 lb

## 2018-01-27 DIAGNOSIS — E669 Obesity, unspecified: Secondary | ICD-10-CM

## 2018-01-27 DIAGNOSIS — I10 Essential (primary) hypertension: Secondary | ICD-10-CM

## 2018-01-27 DIAGNOSIS — E1169 Type 2 diabetes mellitus with other specified complication: Secondary | ICD-10-CM

## 2018-01-27 DIAGNOSIS — R300 Dysuria: Secondary | ICD-10-CM

## 2018-01-27 DIAGNOSIS — R109 Unspecified abdominal pain: Secondary | ICD-10-CM | POA: Diagnosis not present

## 2018-01-27 LAB — POCT URINALYSIS DIPSTICK
Appearance: NORMAL
Bilirubin, UA: NEGATIVE
Blood, UA: NEGATIVE
Glucose, UA: POSITIVE — AB
Nitrite, UA: NEGATIVE
Odor: NORMAL
Protein, UA: NEGATIVE
Spec Grav, UA: 1.025 (ref 1.010–1.025)
Urobilinogen, UA: 0.2 E.U./dL
pH, UA: 6.5 (ref 5.0–8.0)

## 2018-01-27 MED ORDER — LOSARTAN POTASSIUM 25 MG PO TABS: 25.0000 mg | ORAL_TABLET | Freq: Every day | ORAL | 3 refills | Status: DC

## 2018-01-27 MED ORDER — METFORMIN HCL 1000 MG PO TABS: 1000.0000 mg | ORAL_TABLET | Freq: Two times a day (BID) | ORAL | 3 refills | Status: DC

## 2018-01-27 NOTE — Patient Instructions (Addendum)
F/U in 3.5  months, call if you need me before  Non fasting HBa1C , chem 7 and EGFR 1 week before next visit   It is important that you exercise regularly at least 30 minutes 5 times a week. If you develop chest pain, have severe difficulty breathing, or feel very tired, stop exercising immediately and seek medical attention   Please work on good  health habits so that your health will improve. 1. Commitment to daily physical activity for 30 to 60  minutes, if you are able to do this.  2. Commitment to wise food choices. Aim for half of your  food intake to be vegetable and fruit, one quarter starchy foods, and one quarter protein. Try to eat on a regular schedule  3 meals per day, snacking between meals should be limited to vegetables or fruits or small portions of nuts. 64 ounces of water per day is generally recommended, unless you have specific health conditions, like heart failure or kidney failure where you will need to limit fluid intake.  3. Commitment to sufficient and a  good quality of physical and mental rest daily, generally between 6 to 8 hours per day.  WITH PERSISTANCE AND PERSEVERANCE, THE IMPOSSIBLE , BECOMES THE NORM!

## 2018-01-28 ENCOUNTER — Other Ambulatory Visit (HOSPITAL_COMMUNITY)
Admission: RE | Admit: 2018-01-28 | Discharge: 2018-01-28 | Disposition: A | Discharge status: Home / Self Care | Payer: BLUE CROSS/BLUE SHIELD | Source: Other Acute Inpatient Hospital | Attending: Family Medicine | Admitting: Family Medicine

## 2018-01-28 DIAGNOSIS — E669 Obesity, unspecified: Secondary | ICD-10-CM | POA: Insufficient documentation

## 2018-01-28 DIAGNOSIS — E1169 Type 2 diabetes mellitus with other specified complication: Secondary | ICD-10-CM | POA: Diagnosis not present

## 2018-01-28 DIAGNOSIS — R109 Unspecified abdominal pain: Secondary | ICD-10-CM | POA: Insufficient documentation

## 2018-01-29 ENCOUNTER — Encounter: Payer: Self-pay | Admitting: Family Medicine

## 2018-01-29 LAB — URINE CULTURE

## 2018-01-31 ENCOUNTER — Other Ambulatory Visit: Payer: Self-pay | Admitting: Family Medicine

## 2018-01-31 ENCOUNTER — Encounter: Payer: Self-pay | Admitting: Family Medicine

## 2018-01-31 DIAGNOSIS — R300 Dysuria: Secondary | ICD-10-CM | POA: Insufficient documentation

## 2018-01-31 DIAGNOSIS — R109 Unspecified abdominal pain: Secondary | ICD-10-CM | POA: Insufficient documentation

## 2018-01-31 DIAGNOSIS — R10A2 Flank pain, left side: Secondary | ICD-10-CM | POA: Insufficient documentation

## 2018-01-31 MED ORDER — TRIAMTERENE-HCTZ 37.5-25 MG PO TABS: 1.0000 | ORAL_TABLET | Freq: Every day | ORAL | 0 refills | Status: DC

## 2018-01-31 NOTE — Assessment & Plan Note (Signed)
Controlled, no change in medication DASH diet and commitment to daily physical activity for a minimum of 30 minutes discussed and encouraged, as a part of hypertension management. The importance of attaining a healthy weight is also discussed.  BP/Weight 01/27/2018 10/26/2017 10/20/2017 10/07/2017 09/21/2017 09/15/2017 8/98/4210  Systolic BP 312 [phone]  Diastolic BP 82 86 85 84 81 86 87  Wt. (Lbs) 242 - 239 241 238.4 238 238  BMI 40.9 - 40.39 40.73 40.29 40.22 39.61  Some encounter information is confidential and restricted. Go to Review Flowsheets activity to see all data.

## 2018-01-31 NOTE — Assessment & Plan Note (Signed)
Deteriorated. Patient re-educated about  the importance of commitment to a  minimum of 150 minutes of exercise per week.  The importance of healthy food choices with portion control discussed. Encouraged to start a food diary, count calories and to consider  joining a support group. Sample diet sheets offered. Goals set by the patient for the next several months.   Weight /BMI 01/27/2018 10/20/2017 10/07/2017  WEIGHT 242 lb 239 lb 241 lb  HEIGHT 5' 4.5" 5' 4.5" 5' 4.5"  BMI 40.9 kg/m2 40.39 kg/m2 40.73 kg/m2  Some encounter information is confidential and restricted. Go to Review Flowsheets activity to see all data.

## 2018-01-31 NOTE — Progress Notes (Signed)
Christina [name]     MRN: [id number]      DOB: 23-Sep-1975   HPI Christina [name] is here for follow up and re-evaluation of chronic medical conditions, medication management and review of any available recent lab and radiology data.  Preventive health is updated, specifically  Cancer screening and Immunization.   Questions or concerns regarding consultations or procedures which the PT has had in the interim are  addressed. The PT denies any adverse reactions to current medications since the last visit.  C/o back pain x 5 days, however has been moving furniture and she feels as though it may be that she pulled a muscle but also notes urine burning and frequency States her blood sugars have been fluctuating a lot, and does agree that this varies with what she eats, but very seldom is her blood sugar under 140, she has been testing twice daily, she does feel more comfortable using lantus No regular exercise routine , and food control for weight loss is poor Denies polyuria, polydipsia, blurred vision , or hypoglycemic episodes.   ROS Denies recent fever or chills. Denies sinus pressure, nasal congestion, ear pain or sore throat. Denies chest congestion, productive cough or wheezing. Denies chest pains, palpitations and leg swelling Denies abdominal pain, nausea, vomiting,diarrhea or constipation.    Denies headaches, seizures, numbness, or tingling. Denies uncontrolled depression, anxiety or insomnia. Denies skin break down or rash.   PE  BP 124/82   Pulse 98   Resp 16   Ht 5' 4.5" (1.638 m)   Wt 242 lb (109.8 kg)   SpO2 98%   BMI 40.90 kg/m   Patient alert and oriented and in no cardiopulmonary distress.  HEENT: No facial asymmetry, EOMI,   oropharynx pink and moist.  Neck supple no JVD, no mass.  Chest: Clear to auscultation bilaterally.  CVS: S1, S2 no murmurs, no S3.Regular rate.  ABD: Soft non tender. Left flank pain Ext: No edema  MS: Adequate ROM spine, shoulders, hips and  knees.  Skin: Intact, no ulcerations or rash noted.  Psych: Good eye contact, normal affect. Memory intact not anxious or depressed appearing.  CNS: CN 2-12 intact, power,  normal throughout.no focal deficits noted.   Assessment & Plan  Diabetes mellitus type 2 in obese (HCC) Unchanged Christina [name] is reminded of the importance of commitment to daily physical activity for 30 minutes or more, as able and the need to limit carbohydrate intake to 30 to 60 grams per meal to help with blood sugar control.   The need to take medication as prescribed, test blood sugar as directed, and to call between visits if there is a concern that blood sugar is uncontrolled is also discussed.   Christina [name] is reminded of the importance of daily foot exam, annual eye examination, and good blood sugar, blood pressure and cholesterol control.  Diabetic Labs Latest Ref Rng & Units 01/25/2018 10/04/2017 07/07/2017 06/28/2017 06/28/2017  HbA1c 4.8 - 5.6 % 8.5(H) 8.3(H) 8.4(H) - 8.6(H)  Microalbumin Not Estab. ug/mL - - - - -  Micro/Creat Ratio 0.0 - 30.0 mg/g creat - - - - -  Chol 100 - 199 mg/dL - 176 125 - -  HDL >39 mg/dL - 64 51 - -  Calc LDL 0 - 99 mg/dL - 88 48 - -  Triglycerides 0 - 149 mg/dL - 120 132 - -  Creatinine 0.57 - 1.00 mg/dL 0.81 0.86 0.78 0.82 0.91   BP/Weight 01/27/2018 10/26/2017 10/20/2017 10/07/2017  09/21/2017 09/15/2017 9/92/4268  Systolic BP 341 962 229 798 921 194 174  Diastolic BP 82 86 85 84 81 86 87  Wt. (Lbs) 242 - 239 241 238.4 238 238  BMI 40.9 - 40.39 40.73 40.29 40.22 39.61  Some encounter information is confidential and restricted. Go to Review Flowsheets activity to see all data.   Foot/eye exam completion dates Latest Ref Rng & Units 12/29/2017 07/06/2017  Eye Exam No Retinopathy No Retinopathy -  Foot Form Completion - - Done   Increase lantus dose by 3 units in am and 2 units in pm and send in log weekly      Essential hypertension, benign Controlled, no change in  medication DASH diet and commitment to daily physical activity for a minimum of 30 minutes discussed and encouraged, as a part of hypertension management. The importance of attaining a healthy weight is also discussed.  BP/Weight 01/27/2018 10/26/2017 10/20/2017 10/07/2017 09/21/2017 09/15/2017 0/81/4481  Systolic BP 856 314 970 263 785 885 027  Diastolic BP 82 86 85 84 81 86 87  Wt. (Lbs) 242 - 239 241 238.4 238 238  BMI 40.9 - 40.39 40.73 40.29 40.22 39.61  Some encounter information is confidential and restricted. Go to Review Flowsheets activity to see all data.       Morbid obesity Deteriorated. Patient re-educated about  the importance of commitment to a  minimum of 150 minutes of exercise per week.  The importance of healthy food choices with portion control discussed. Encouraged to start a food diary, count calories and to consider  joining a support group. Sample diet sheets offered. Goals set by the patient for the next several months.   Weight /BMI 01/27/2018 10/20/2017 10/07/2017  WEIGHT 242 lb 239 lb 241 lb  HEIGHT 5' 4.5" 5' 4.5" 5' 4.5"  BMI 40.9 kg/m2 40.39 kg/m2 40.73 kg/m2  Some encounter information is confidential and restricted. Go to Review Flowsheets activity to see all data.      Dysuria CCUA abnormal will send for culture  Left flank pain Left flank  pain likely triggered by moving furniture, urine tested and is abnormal will follow this up

## 2018-01-31 NOTE — Assessment & Plan Note (Signed)
CCUA abnormal will send for culture

## 2018-01-31 NOTE — Assessment & Plan Note (Signed)
Unchanged Christina [name] is reminded of the importance of commitment to daily physical activity for 30 minutes or more, as able and the need to limit carbohydrate intake to 30 to 60 grams per meal to help with blood sugar control.   The need to take medication as prescribed, test blood sugar as directed, and to call between visits if there is a concern that blood sugar is uncontrolled is also discussed.   Christina [name] is reminded of the importance of daily foot exam, annual eye examination, and good blood sugar, blood pressure and cholesterol control.  Diabetic Labs Latest Ref Rng & Units 01/25/2018 10/04/2017 07/07/2017 06/28/2017 06/28/2017  HbA1c 4.8 - 5.6 % 8.5(H) 8.3(H) 8.4(H) - 8.6(H)  Microalbumin Not Estab. ug/mL - - - - -  Micro/Creat Ratio 0.0 - 30.0 mg/g creat - - - - -  Chol 100 - 199 mg/dL - 176 125 - -  HDL >39 mg/dL - 64 51 - -  Calc LDL 0 - 99 mg/dL - 88 48 - -  Triglycerides 0 - 149 mg/dL - 120 132 - -  Creatinine 0.57 - 1.00 mg/dL 0.81 0.86 0.78 0.82 0.91   BP/Weight 01/27/2018 10/26/2017 10/20/2017 10/07/2017 09/21/2017 09/15/2017 10/16/1759  Systolic BP 607 371 062 694 854 627 035  Diastolic BP 82 86 85 84 81 86 87  Wt. (Lbs) 242 - 239 241 238.4 238 238  BMI 40.9 - 40.39 40.73 40.29 40.22 39.61  Some encounter information is confidential and restricted. Go to Review Flowsheets activity to see all data.   Foot/eye exam completion dates Latest Ref Rng & Units 12/29/2017 07/06/2017  Eye Exam No Retinopathy No Retinopathy -  Foot Form Completion - - Done   Increase lantus dose by 3 units in am and 2 units in pm and send in log weekly

## 2018-01-31 NOTE — Assessment & Plan Note (Addendum)
Left flank  pain likely triggered by moving furniture, urine tested and is abnormal will follow this up

## 2018-02-07 ENCOUNTER — Telehealth: Payer: Self-pay

## 2018-02-07 MED ORDER — OMEPRAZOLE 20 MG PO CPDR: 20.0000 mg | DELAYED_RELEASE_CAPSULE | Freq: Every day | ORAL | 11 refills | Status: DC

## 2018-02-07 NOTE — Telephone Encounter (Signed)
Completed.

## 2018-02-07 NOTE — Telephone Encounter (Signed)
Fax received from [facility] requesting a prescription for the Omeprazole.  I have entered the pharmacy.  Forwarding to refill box.

## 2018-02-15 ENCOUNTER — Other Ambulatory Visit: Payer: Self-pay

## 2018-02-15 ENCOUNTER — Encounter: Payer: Self-pay | Admitting: Family Medicine

## 2018-02-15 DIAGNOSIS — E669 Obesity, unspecified: Secondary | ICD-10-CM

## 2018-02-15 DIAGNOSIS — I10 Essential (primary) hypertension: Secondary | ICD-10-CM

## 2018-02-15 DIAGNOSIS — E1169 Type 2 diabetes mellitus with other specified complication: Secondary | ICD-10-CM

## 2018-02-15 MED ORDER — GLIPIZIDE ER 10 MG PO TB24: ORAL_TABLET | ORAL | 1 refills | Status: DC

## 2018-02-25 ENCOUNTER — Ambulatory Visit: Payer: BLUE CROSS/BLUE SHIELD | Admitting: Gastroenterology

## 2018-02-25 ENCOUNTER — Encounter: Payer: Self-pay | Admitting: Gastroenterology

## 2018-02-25 VITALS — BP 122/78 | HR 97 | Temp 97.6°F | Ht 64.5 in | Wt 239.6 lb

## 2018-02-25 DIAGNOSIS — K219 Gastro-esophageal reflux disease without esophagitis: Secondary | ICD-10-CM | POA: Diagnosis not present

## 2018-02-25 DIAGNOSIS — R109 Unspecified abdominal pain: Secondary | ICD-10-CM | POA: Insufficient documentation

## 2018-02-25 DIAGNOSIS — R1033 Periumbilical pain: Secondary | ICD-10-CM

## 2018-02-25 DIAGNOSIS — M6208 Separation of muscle (nontraumatic), other site: Secondary | ICD-10-CM

## 2018-02-25 DIAGNOSIS — K429 Umbilical hernia without obstruction or gangrene: Secondary | ICD-10-CM

## 2018-02-25 NOTE — Patient Instructions (Signed)
PA for CT abd/pelvis w/contrast submitted via [employer].Case approved. Order ID: [id number], valid 02/25/18-03/26/18.

## 2018-02-25 NOTE — Patient Instructions (Signed)
1. CT scan as scheduled. We will contact you with results as available.  2. Continue omeprazole and Miralax as before.  3. Return to the office in one year.

## 2018-02-25 NOTE — Progress Notes (Signed)
Primary Care Physician: [name], MD  Primary Gastroenterologist:  Barney Drain, MD   Chief Complaint  Patient presents with  . Gastroesophageal Reflux    when forgets to take med    HPI: Christina [name] is a 42 y.o. female here for follow-up.  She was seen back in February of this year with complaints of abdominal pain around the umbilicus.  Also with history of GERD, gastroparesis, IDA.  Previously scheduled colonoscopy and EGD in October 2018 had been canceled due to personal illness.  She was back to get those rescheduled.  On exam she had a small fingertip sized umbilical hernia which is easily reducible and slightly tender.  Rectus diastases noted.  We had planned on a CT abdomen pelvis for worsening umbilical hernia pain but she reports before she got it done she changed insurances.  She had a colonoscopy and EGD back in March of this year.  Terminal ileum was normal, transverse and sigmoid colon was tortuous, small internal hemorrhoids.  Gastritis, gastric polyps with remarkable path.  No H. pylori.  Small bowel biopsies negative for celiac disease.  Heartburn doing well on omeprazole. No heartburn. No dysphagia. Miralax daily as needed for constipation.  She notes if she has a bowl of cereal she tends to have a bowel movement the next day.  She continues to have discomfort around the umbilicus but also radiates to both flanks.  Associated with nausea but no vomiting.  Symptoms worse this week.  Tends to have abdominal pain several days out of the week.  Pain moderate to severe at times.  No unintentional weight loss.  Current Outpatient Medications  Medication Sig Dispense Refill  . acetaminophen (TYLENOL) 500 MG tablet Take 1,000 mg every 8 (eight) hours as needed by mouth for mild pain.     . ARIPiprazole (ABILIFY) 5 MG tablet Take 1 tablet (5 mg total) by mouth every evening. 90 tablet 0  . B-D UF III MINI PEN NEEDLES 31G X 5 MM MISC USE AS DIRECTED 100 each 2  .  fluticasone (FLONASE) 50 MCG/ACT nasal spray Place 2 sprays into both nostrils daily. 16 g 6  . glipiZIDE (GLUCOTROL XL) 10 MG 24 hr tablet TAKE 2 TABLETS BY MOUTH  EVERY MORNING AT BREAKFAT 180 tablet 1  . glucose blood test strip Use as instructed, three times daily 300 each 12  . ibuprofen (ADVIL,MOTRIN) 200 MG tablet Take 600 mg by mouth every 8 (eight) hours as needed (FOR PAIN.).    . Insulin Glargine (LANTUS) 100 UNIT/ML Solostar Pen Inject 10 Units into the skin daily at 10 pm. 15 mL 11  . loratadine (CLARITIN) 10 MG tablet Take 1 tablet (10 mg total) by mouth daily. Taking generic otc allergy (Patient taking differently: Take 10 mg by mouth every evening. Taking generic otc allergy) 90 tablet 1  . losartan (COZAAR) 25 MG tablet Take 1 tablet (25 mg total) by mouth daily. 90 tablet 3  . metFORMIN (GLUCOPHAGE) 1000 MG tablet Take 1 tablet (1,000 mg total) by mouth 2 (two) times daily with a meal. 180 tablet 3  . montelukast (SINGULAIR) 10 MG tablet Take 10 mg by mouth at bedtime.    Marland Kitchen omeprazole (PRILOSEC) 20 MG capsule Take 1 capsule (20 mg total) by mouth daily before breakfast. 30 capsule 11  . ONETOUCH DELICA LANCETS 82N MISC Three times daily testing dx e11.9 300 each 5  . polyethylene glycol powder (GLYCOLAX/MIRALAX) powder Take 17 g  by mouth daily. (Patient taking differently: Take 17 g by mouth daily as needed (for constipation.). ) 255 g 0  . sodium chloride (OCEAN) 0.65 % SOLN nasal spray Place 1 spray into both nostrils 4 (four) times daily as needed for congestion.    . triamterene-hydrochlorothiazide (MAXZIDE-25) 37.5-25 MG tablet Take 1 tablet by mouth daily. 90 tablet 0  . valACYclovir (VALTREX) 500 MG tablet Take 1,000 mg by mouth daily as needed (for 3 days if needed for out breaks).      No current facility-administered medications for this visit.     Allergies as of 02/25/2018 - Review Complete 02/25/2018  Allergen Reaction Noted  . Other Anaphylaxis 06/13/2013  .  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 Peanut-containing drug products Anaphylaxis 02/08/2013  . Augmentin [amoxicillin-pot clavulanate] Nausea And Vomiting 04/07/2013    ROS:  General: Negative for anorexia, weight loss, fever, chills, fatigue, weakness. ENT: Negative for hoarseness, difficulty swallowing , nasal congestion. CV: Negative for chest pain, angina, palpitations, dyspnea on exertion, peripheral edema.  Respiratory: Negative for dyspnea at rest, dyspnea on exertion, cough, sputum, wheezing.  GI: See history of present illness. GU:  Negative for dysuria, hematuria, urinary incontinence, urinary frequency, nocturnal urination.  Endo: Negative for unusual weight change.    Physical Examination:   BP 122/78   Pulse 97   Temp 97.6 F (36.4 C) (Oral)   Ht 5' 4.5" (1.638 m)   Wt 239 lb 9.6 oz (108.7 kg)   BMI 40.49 kg/m   General: Well-nourished, well-developed in no acute distress.  Eyes: No icterus. Mouth: Oropharyngeal mucosa moist and pink , no lesions erythema or exudate. Lungs: Clear to auscultation bilaterally.  Heart: Regular rate and rhythm, no murmurs rubs or gallops.  Abdomen: Bowel sounds are normal, nondistended, no hepatosplenomegaly or masses, no abdominal bruits.  Rectus diastases noted.  She also has a small umbilical hernia moderately tender, easily reducible.  no rebound or guarding.   Extremities: No lower extremity edema. No clubbing or deformities. Neuro: Alert and oriented x 4   Skin: Warm and dry, no jaundice.   Psych: Alert and cooperative, normal mood and affect.  Labs:  Lab Results  Component Value Date   CREATININE 0.81 01/25/2018   BUN 8 01/25/2018   NA 140 01/25/2018   K 4.2 01/25/2018   CL 103 01/25/2018   CO2 25 01/25/2018   Lab Results  Component Value Date   ALT 14 01/25/2018   AST 12 01/25/2018   ALKPHOS 59 01/25/2018   BILITOT 0.3 01/25/2018   Lab Results  Component Value Date   WBC 4.8 10/20/2017   HGB 12.3 10/20/2017    HCT 37.9 10/20/2017   MCV 94.5 10/20/2017   PLT 333 10/20/2017   Lab Results  Component Value Date   HGBA1C 8.5 (H) 01/25/2018    Imaging Studies: No results found.

## 2018-02-25 NOTE — Assessment & Plan Note (Signed)
Chronic frequent periumbilical abdominal pain, bandlike radiation to both flanks.  She has rectus diastases and small umbilical hernia.  Suspect pain related to her hernia.  EGD and colonoscopy up-to-date.  We will plan on CT back in March but this was postponed due to change in insurance at the time.  We will go ahead with CT abdomen pelvis with contrast for further evaluation of abdominal pain and evaluation of hernia.

## 2018-02-25 NOTE — Assessment & Plan Note (Signed)
Doing well on omeprazole.  Continue current regimen.  Reinforced need for increasing daily activities and modest weight loss.  Return to the office in 1 year.

## 2018-02-28 NOTE — Progress Notes (Signed)
CC'D TO PCP °

## 2018-03-11 ENCOUNTER — Ambulatory Visit (HOSPITAL_COMMUNITY)
Admission: RE | Admit: 2018-03-11 | Discharge: 2018-03-11 | Disposition: A | Discharge status: Home / Self Care | Payer: BLUE CROSS/BLUE SHIELD | Source: Ambulatory Visit | Attending: Gastroenterology | Admitting: Gastroenterology

## 2018-03-11 ENCOUNTER — Encounter (HOSPITAL_COMMUNITY): Payer: Self-pay

## 2018-03-11 DIAGNOSIS — K429 Umbilical hernia without obstruction or gangrene: Secondary | ICD-10-CM | POA: Insufficient documentation

## 2018-03-11 DIAGNOSIS — R1033 Periumbilical pain: Secondary | ICD-10-CM | POA: Insufficient documentation

## 2018-03-11 DIAGNOSIS — R197 Diarrhea, unspecified: Secondary | ICD-10-CM | POA: Diagnosis not present

## 2018-03-11 DIAGNOSIS — R109 Unspecified abdominal pain: Secondary | ICD-10-CM | POA: Diagnosis not present

## 2018-03-11 DIAGNOSIS — D3501 Benign neoplasm of right adrenal gland: Secondary | ICD-10-CM | POA: Insufficient documentation

## 2018-03-11 DIAGNOSIS — R1110 Vomiting, unspecified: Secondary | ICD-10-CM | POA: Diagnosis not present

## 2018-03-11 LAB — POCT I-STAT CREATININE: Creatinine, Ser: 0.8 mg/dL (ref 0.44–1.00)

## 2018-03-11 MED ORDER — IOPAMIDOL (ISOVUE-300) INJECTION 61%
100.0000 mL | Freq: Once | INTRAVENOUS | Status: AC | PRN
  Administered 2018-03-11: 100 mL via INTRAVENOUS

## 2018-03-17 ENCOUNTER — Telehealth: Payer: Self-pay | Admitting: *Deleted

## 2018-03-17 NOTE — Telephone Encounter (Signed)
Patient called and stated she reviewed her CT results on mychart that she done last week and wants to discuss the results.

## 2018-03-17 NOTE — Telephone Encounter (Signed)
Forwarding to [place] for CT results.

## 2018-03-19 IMAGING — CT CT MAXILLOFACIAL W/ CM
3 series · 15 of 47 positions shown, 18 images · IV contrast (Isovue)
Comparison: Sinus radiographs 07/06/2017. Limited coronal sinus CT
03/01/2009.

CLINICAL DATA: Sinusitis. Failed antibiotic treatment. Ongoing
facial pain and pressure.

EXAM:
CT MAXILLOFACIAL WITH CONTRAST
TECHNIQUE: Multidetector CT images of the paranasal sinuses were obtained using
the standard protocol with intravenous contrast.
CONTRAST:  75 mL [id number]

[Series 2: max soft · axial · 0.33mm/px · z∈[-66,+84]mm · 9 of 87 slices shown, 12 images]
[im 6/87  brain]
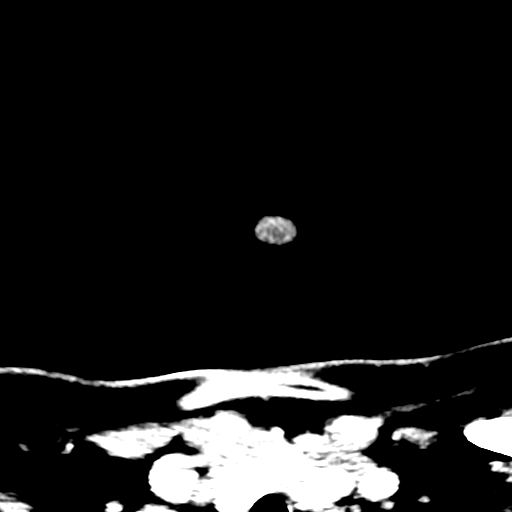
[im 6/87  bone]
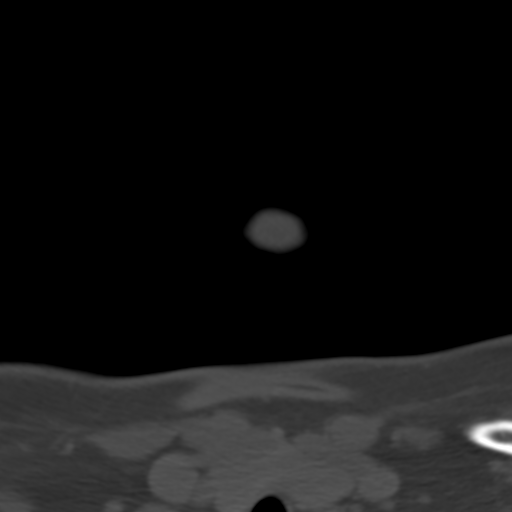
[im 15/87  bone]
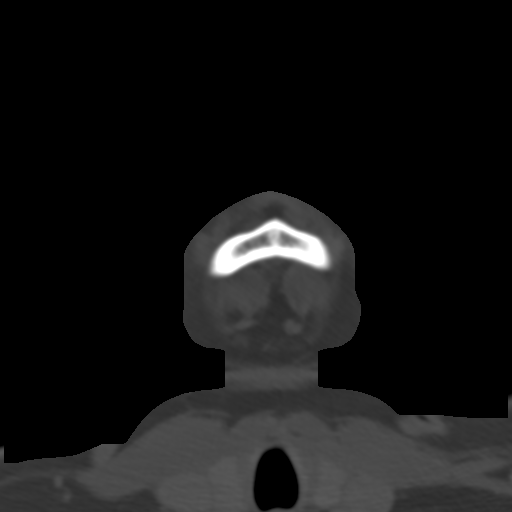
[im 24/87  bone]
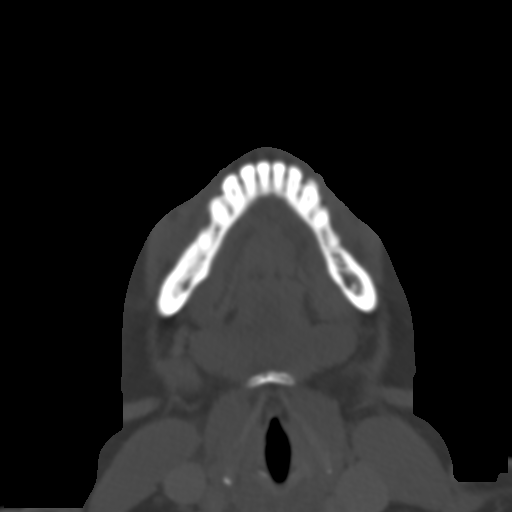
[im 33/87  bone]
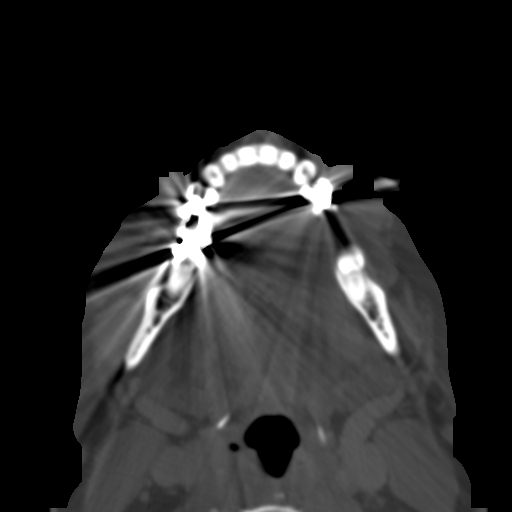
[im 45/87  brain]
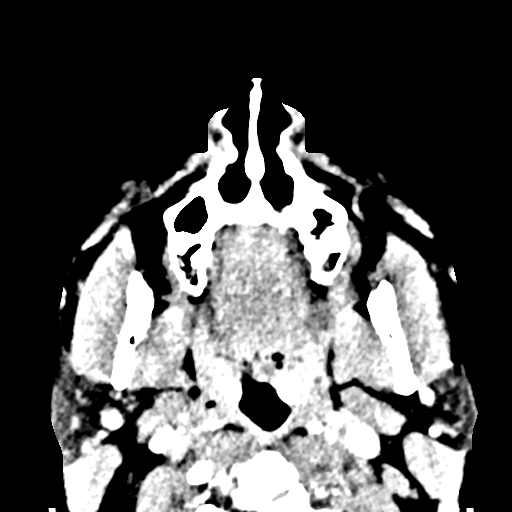
[im 45/87  bone]
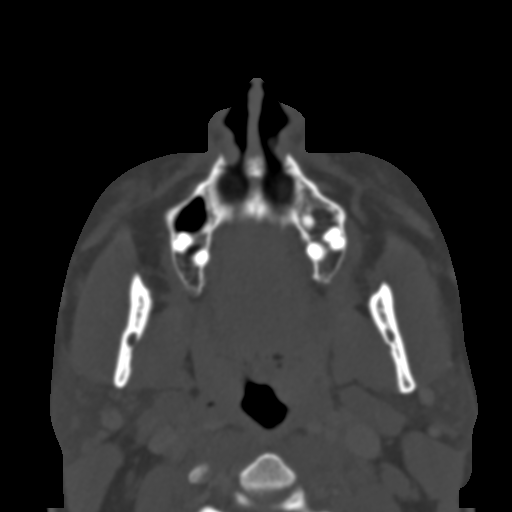
[im 54/87  bone]
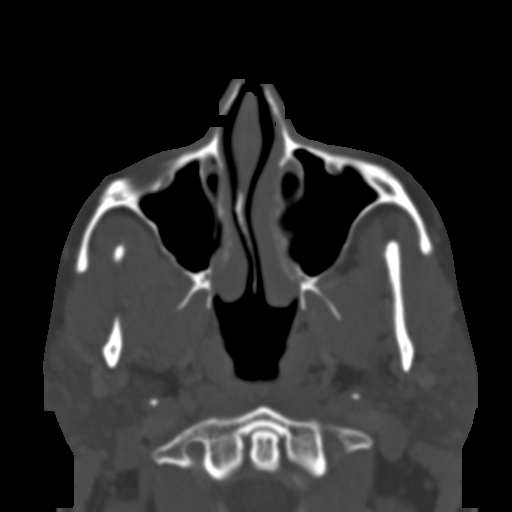
[im 63/87  bone]
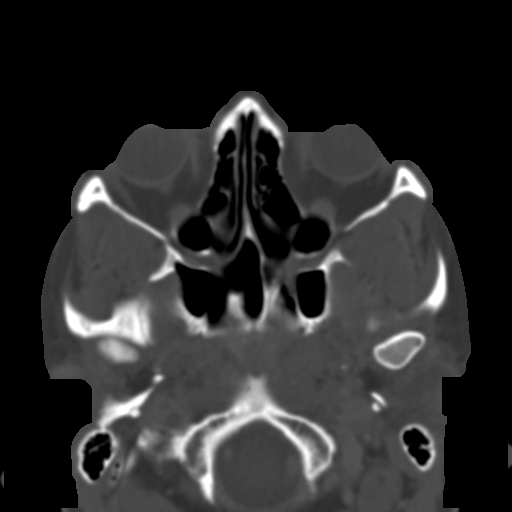
[im 72/87  bone]
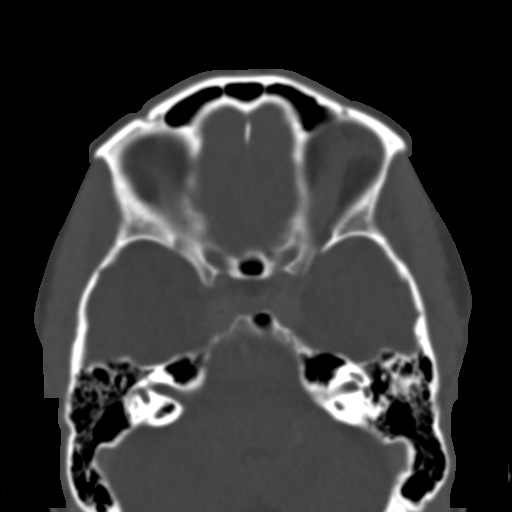
[im 81/87  brain]
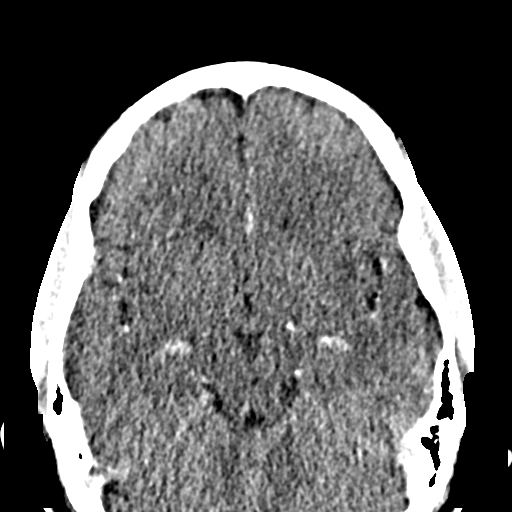
[im 81/87  bone]
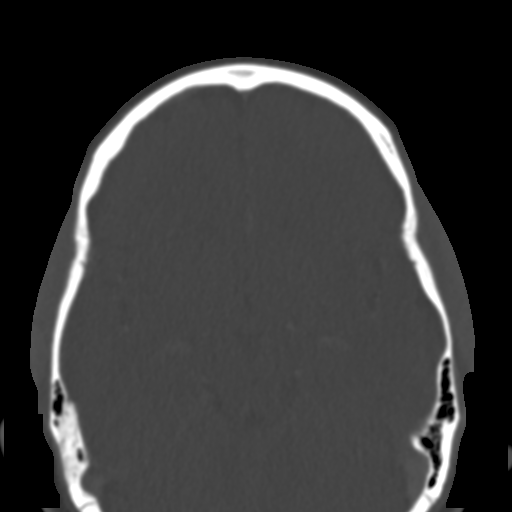

[Series 4: coronal soft · coronal · 0.30mm/px · 3 of 57 slices shown]
[im 19/57  bone]
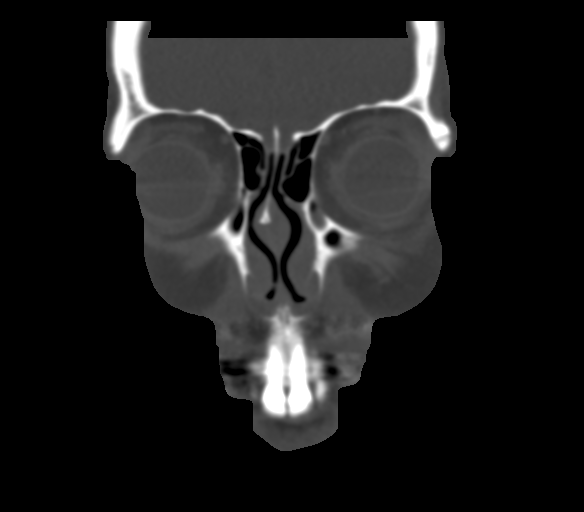
[im 25/57  bone]
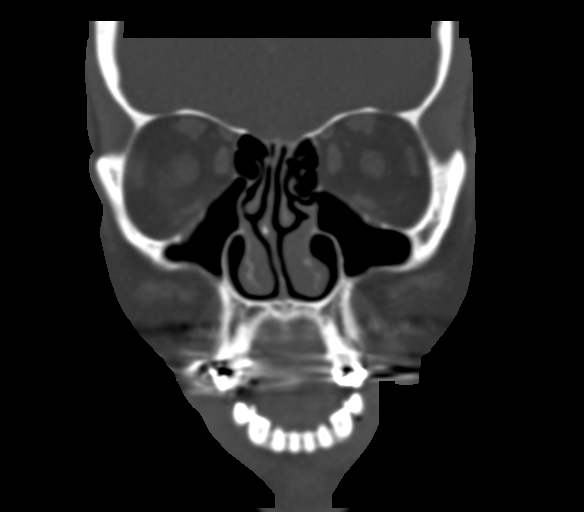
[im 32/57  bone]
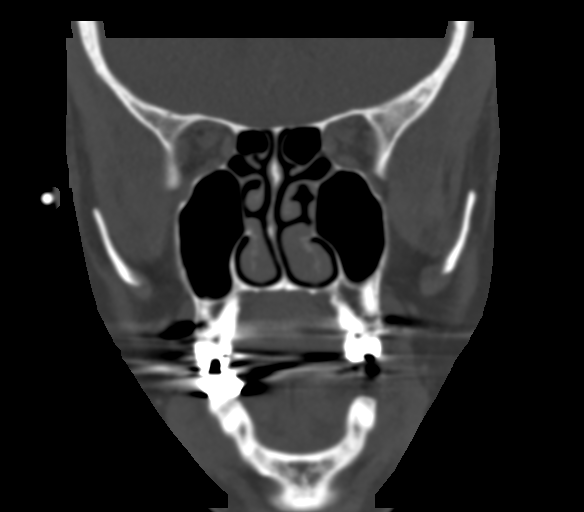

[Series 5: sagittal soft · sagittal · 0.29mm/px · 3 of 72 slices shown]
[im 24/72  bone]
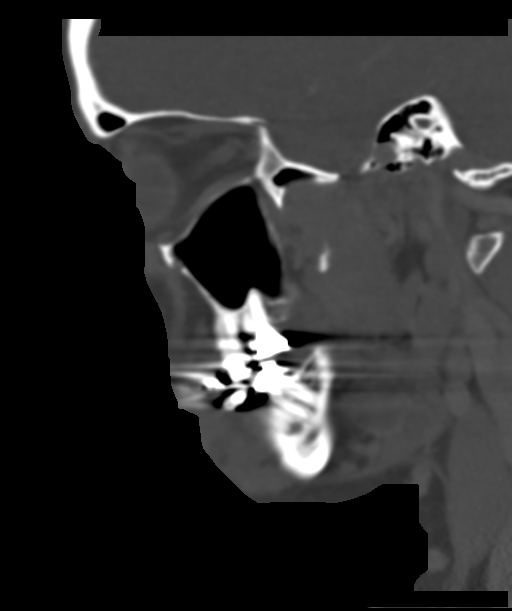
[im 36/72  bone]
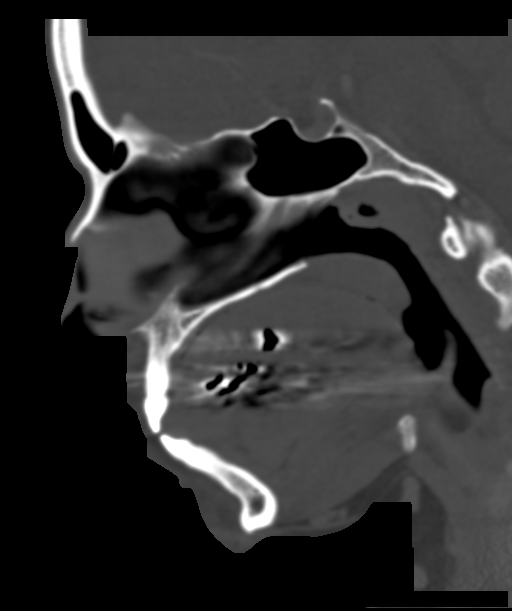
[im 48/72  bone]
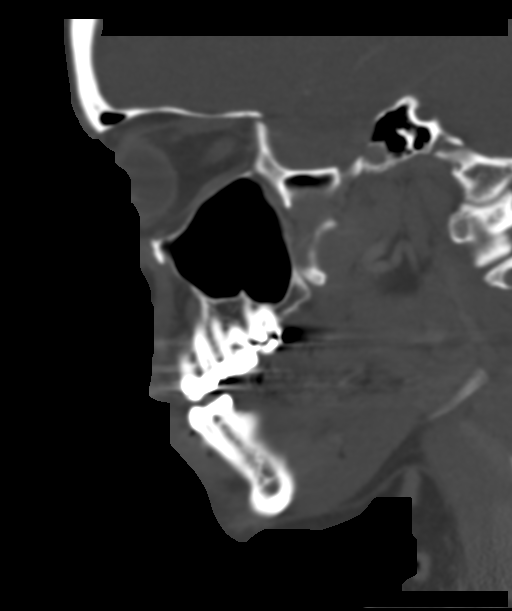

[15 of 47 positions shown; findings below may reference images not displayed]

FINDINGS: Paranasal sinuses:

Frontal: Normally aerated. Patent frontal sinus drainage pathways.

Ethmoid: Normally aerated.

Maxillary: Normally aerated.

Sphenoid: Normally aerated. Patent sphenoethmoidal recesses.

Right ostiomeatal unit: Patent.

Left ostiomeatal unit: Patent.

Nasal passages: Patent. 6 mm rightward nasal septal deviation.

Anatomy: No pneumatization superior to anterior ethmoid notches.
Symmetric and intact olfactory grooves and fovea ethmoidalis, K[place]
II. Sellar sphenoid pneumatization pattern. No dehiscence of carotid
or optic canals. No onodi cell.

Other: Clear tympanic cavities and mastoid air cells bilaterally.
Unremarkable orbits and included portion of the brain.
IMPRESSION: 1. Clear sinuses.
2. Rightward nasal septal deviation.

## 2018-03-21 ENCOUNTER — Other Ambulatory Visit: Payer: Self-pay

## 2018-03-21 DIAGNOSIS — K429 Umbilical hernia without obstruction or gangrene: Secondary | ICD-10-CM

## 2018-03-21 DIAGNOSIS — D3500 Benign neoplasm of unspecified adrenal gland: Secondary | ICD-10-CM

## 2018-03-21 NOTE — Telephone Encounter (Signed)
Noted  

## 2018-03-21 NOTE — Progress Notes (Signed)
PT is aware. Ok to make both referrals. Forwarding to [facility] to make referrals.

## 2018-03-21 NOTE — Telephone Encounter (Signed)
See result note.  

## 2018-03-22 DIAGNOSIS — I10 Essential (primary) hypertension: Secondary | ICD-10-CM | POA: Diagnosis not present

## 2018-03-22 DIAGNOSIS — R809 Proteinuria, unspecified: Secondary | ICD-10-CM | POA: Diagnosis not present

## 2018-03-28 DIAGNOSIS — B349 Viral infection, unspecified: Secondary | ICD-10-CM | POA: Diagnosis not present

## 2018-03-30 ENCOUNTER — Ambulatory Visit (HOSPITAL_COMMUNITY): Payer: Self-pay | Admitting: Psychiatry

## 2018-03-31 ENCOUNTER — Encounter (HOSPITAL_COMMUNITY): Payer: Self-pay | Admitting: Psychology

## 2018-03-31 ENCOUNTER — Ambulatory Visit (INDEPENDENT_AMBULATORY_CARE_PROVIDER_SITE_OTHER): Payer: BLUE CROSS/BLUE SHIELD | Admitting: Psychology

## 2018-03-31 DIAGNOSIS — F3162 Bipolar disorder, current episode mixed, moderate: Secondary | ICD-10-CM

## 2018-04-05 ENCOUNTER — Encounter (HOSPITAL_COMMUNITY): Payer: Self-pay | Admitting: Psychology

## 2018-04-05 ENCOUNTER — Telehealth: Payer: Self-pay | Admitting: Gastroenterology

## 2018-04-05 DIAGNOSIS — K429 Umbilical hernia without obstruction or gangrene: Secondary | ICD-10-CM

## 2018-04-05 NOTE — Telephone Encounter (Signed)
Called pt, she was aware of appt with Dr. Arnoldo Morale 04/21/18 for hernia. She may cancel the appt, but wants referral sent to [facility]. Referral faxed to [facility].

## 2018-04-05 NOTE — Progress Notes (Signed)
Comprehensive Clinical Assessment (CCA) Note  04/05/2018 Christina [name] [id number]  Visit Diagnosis:      ICD-10-CM   1. Bipolar 1 disorder, mixed, moderate (HCC) F31.62       CCA Part One  Part One has been completed on paper by the patient.  (See scanned document in Chart Review)  CCA Part Two A  Intake/Chief Complaint:  CCA Intake With Chief Complaint CCA Part Two Date: 03/31/18 CCA Part Two Time: 8 Chief Complaint/Presenting Problem: Pt has been referred to counseling in the past by [name] and has been hesistant to restart counseling in past several years.  Pt reported she is now in counseling program at [facility] with potential assignment for 10 hours of individual counseling and this was the push for her to return to counseling.  pt reports she recognizes that she does have needs for counseling and this is opportunity for her to open up.  pt was dx w/ Bipolar d/o in 2006 when referred by PCP to [name].  pt has been in tx w/ [name] since and relatively stable w/ her medication regimine of Abilify.  pt first counseling was at [facility] in 1997 when dx w/ depression.  pt had periods of counseling beginning in 2007 and felt that was beneficial in past.  Last counseling was in 2013/14.   Patients Currently Reported Symptoms/Problems: Pt reports that mood has generally been stable recent.  pt reports she typically sleeps well- past couple of nights some difficulty with waking early.  pt reported that hx of a lot of job change, starting a lot projects/ not finishing, hx of sleep issues- not needing sleep but still being very productive.  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  Pt feels good that while a job change- maintained at last job 4.5 years and this opportuntiy presented.  Pt also started [employer] program for counseling in May 2019- taking summer classes and now fall semester.  Pt does have stressor  of transitions- new job, new home, startign school- all positives and stressors of medical issues- upcoming surgery.  pt reported no current manic or hypermanic- not overspending, no new projects.  pt is focusing on exericising, self care Collateral Involvement: Dr. Marguerite Olea notes Individual's Strengths: supports "my parents, my husband, dear friend".  "tennis, love to try to cook" " working on [employer]- workign on being persistant and seeing things to fruition" Individual's Preferences: "coping strategies, how to navigate my disorder, don't understand why mine" Type of Services Patient Feels Are Needed: counseling and medication management  Mental Health Symptoms Depression:  Depression: Change in energy/activity, Difficulty Concentrating, Hopelessness, Sleep (too much or little)  Mania:  Mania: Change in energy/activity, Increased Energy, Recklessness, Racing thoughts  Anxiety:   Anxiety: N/A  Psychosis:  Psychosis: N/A  Trauma:  Trauma: N/A  Obsessions:  Obsessions: N/A  Compulsions:  Compulsions: N/A  Inattention:  Inattention: N/A  Hyperactivity/Impulsivity:     Oppositional/Defiant Behaviors:  Oppositional/Defiant Behaviors: N/A  Borderline Personality:  Emotional Irregularity: N/A  Other Mood/Personality Symptoms:      Mental Status Exam Appearance and self-care  Stature:  Stature: Average  Weight:  Weight: Overweight  Clothing:  Clothing: Neat/clean  Grooming:  Grooming: Well-groomed  Cosmetic use:  Cosmetic Use: Age appropriate  Posture/gait:  Posture/Gait: Normal  Motor activity:  Motor Activity: Not Remarkable  Sensorium  Attention:  Attention: Normal  Concentration:  Concentration: Normal  Orientation:  Orientation: X5  Recall/memory:  Recall/Memory: Normal  Affect and Mood  Affect:  Affect: Appropriate  Mood:  Mood: Euthymic  Relating  Eye contact:  Eye Contact: Normal  Facial expression:  Facial Expression: Responsive  Attitude toward examiner:   Attitude Toward Examiner: Cooperative  Thought and Language  Speech flow: Speech Flow: Normal  Thought content:  Thought Content: Appropriate to mood and circumstances  Preoccupation:     Hallucinations:     Organization:     Transport planner of Knowledge:  Fund of Knowledge: Average  Intelligence:  Intelligence: Average  Abstraction:  Abstraction: Normal  Judgement:  Judgement: Normal  Reality Testing:  Reality Testing: Adequate  Insight:  Insight: Good  Decision Making:  Decision Making: Normal  Social Functioning  Social Maturity:  Social Maturity: Responsible  Social Judgement:  Social Judgement: Normal  Stress  Stressors:  Stressors: Transitions, Illness(medical)  Coping Ability:  Coping Ability: English as a second language teacher Deficits:     Supports:      Family and Psychosocial History: Family history Marital status: Married Number of Years Married: 69 Are you sexually active?: Yes Does patient have children?: Yes How many children?: 3 How is patient's relationship with their children?: 11y/o son, 4y/o daughter, 2y/o son  Childhood History:  Childhood History By whom was/is the patient raised?: Both parents Additional childhood history information: grew up in [place], [place] Description of patient's relationship with caregiver when they were a child: positive relationship w/ both parents  Patient's description of current relationship with people who raised him/her: parents are supportive  Does patient have siblings?: No Did patient suffer any verbal/emotional/physical/sexual abuse as a child?: No Did patient suffer from severe childhood neglect?: No Has patient ever been sexually abused/assaulted/raped as an adolescent or adult?: No Was the patient ever a victim of a crime or a disaster?: No Witnessed domestic violence?: No Has patient been effected by domestic violence as an adult?: No  CCA Part Two B  Employment/Work Situation: Employment / Work  Copywriter, 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) Where is patient currently employed?: [employer] as Mudlogger of victim services How long has patient been employed?: almost 5 months Patient's job has been impacted by current illness: No What is the longest time patient has a held a job?: 4.5 years Where was the patient employed at that time?: [employer] support services Are There Guns or Chiropractor in [place]?: No  Education: Museum/gallery curator Currently Attending: [facility] program- distance learning program Did Teacher, adult education From [employer]?: Yes Did You Attend College?: Yes What Type of College Degree Do you Have?: [employer] completed undergrade.  in past has also attendign nursing school and law school- didn't complete.  Did You Attend Graduate School?: Yes(currently in grad school) Did You Have An Individualized Education Program (IIEP): No Did You Have Any Difficulty At School?: No  Religion: Religion/Spirituality Are You A Religious Person?: Yes What is Your Religious Affiliation?: [place] How Might This Affect Treatment?: "won't"   Leisure/Recreation: Leisure / Recreation Leisure and Hobbies: tennis, cooking  Exercise/Diet: Exercise/Diet Do You Exercise?: Yes What Type of Exercise Do You Do?: (tennis) How Many Times a Week Do You Exercise?: 1-3 times a week Have You Gained or Lost A Significant Amount of Weight in the Past Six Months?: No Do You Follow a Special Diet?: No Do You Have Any Trouble Sleeping?: No  CCA Part Two C  Alcohol/Drug Use:  Alcohol / Drug Use History of alcohol / drug use?: No history of alcohol / drug abuse                      CCA Part Three  ASAM's:  Six Dimensions of Multidimensional Assessment  Dimension 1:  Acute Intoxication and/or Withdrawal Potential:     Dimension 2:  Biomedical Conditions and Complications:     Dimension 3:  Emotional, Behavioral, or  Cognitive Conditions and Complications:     Dimension 4:  Readiness to Change:     Dimension 5:  Relapse, Continued use, or Continued Problem Potential:     Dimension 6:  Recovery/Living Environment:      Substance use Disorder (SUD)    Social Function:  Social Functioning Social Maturity: Responsible Social Judgement: Normal  Stress:  Stress Stressors: Transitions, Illness(medical) Coping Ability: Overwhelmed Patient Takes Medications The Way The Doctor Instructed?: Yes Priority Risk: Low Acuity  Risk Assessment- Self-Harm Potential: Risk Assessment For Self-Harm Potential Thoughts of Self-Harm: No current thoughts Method: No plan  Risk Assessment -Dangerous to Others Potential: Risk Assessment For Dangerous to Others Potential Method: No Plan  DSM5 Diagnoses: Patient Active Problem List   Diagnosis Date Noted  . Abdominal pain 02/25/2018  . Dysuria 01/31/2018  . Left flank pain 01/31/2018  . Umbilical hernia 16/05/9603  . Diastasis recti 09/21/2017  . Abdominal pain, epigastric 03/04/2017  . Bipolar 1 disorder, mixed, moderate ([place]) 06/17/2016  . Diabetes mellitus type 2 in obese ([place]) 03/16/2016  . Bipolar 1 disorder ([place]) 11/19/2014  . Rectal bleeding 03/30/2014  . Fatty liver 10/21/2013  . Seasonal allergies 09/26/2013  . Essential hypertension, benign 08/09/2013  . Iron deficiency anemia 05/15/2013  . Family history of blood disorder 02/28/2013  . 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 GASTROPARESIS 09/02/2009  . GERD 01/27/2009  . Morbid obesity ([place]) 01/05/2008    Patient Centered Plan: Patient is on the following Treatment Plan(s):  SEE tx plan  Recommendations for Services/Supports/Treatments: Recommendations for Services/Supports/Treatments Recommendations For Services/Supports/Treatments: Medication Management, Individual Therapy  Treatment Plan Summary: OP Treatment Plan Summary: individual counseling biweekly  to assist coping w/ life stressors and managing bipolar disorder  Pt to continue to f/u as scheduled w/ [name] and f/u as needed w/ PCP.  [name]

## 2018-04-05 NOTE — Telephone Encounter (Signed)
-----   Message -----  From: [name]  Sent: 04/01/2018  3:58 PM EDT  To: [facility] Clinical Pool  Subject: Non-Urgent Medical Question             Greetings:    I was wondering if a referral for my umbilical hernia could be sent to the [place] at [facility]. Please see below...    New Hernia appointment (Inguinal, ventral, incisional, and umbilical): Call [phone] or fax to [phone]. I have attached the referral form. Please advise...    Kindly,  KAGwynn      PLEASE MAKE REFERRAL FOR PATIENT.

## 2018-04-05 NOTE — Addendum Note (Signed)
Addended by: Hassan Rowan on: 04/05/2018 09:22 AM   Modules accepted: Orders

## 2018-04-12 ENCOUNTER — Encounter: Payer: Self-pay | Admitting: Family Medicine

## 2018-04-12 ENCOUNTER — Ambulatory Visit: Payer: Self-pay | Admitting: Family Medicine

## 2018-04-12 ENCOUNTER — Other Ambulatory Visit: Payer: Self-pay | Admitting: Family Medicine

## 2018-04-12 DIAGNOSIS — E1065 Type 1 diabetes mellitus with hyperglycemia: Secondary | ICD-10-CM

## 2018-04-12 NOTE — Progress Notes (Signed)
amb endo  

## 2018-04-19 DIAGNOSIS — Z6841 Body Mass Index (BMI) 40.0 and over, adult: Secondary | ICD-10-CM | POA: Diagnosis not present

## 2018-04-19 DIAGNOSIS — K429 Umbilical hernia without obstruction or gangrene: Secondary | ICD-10-CM | POA: Diagnosis not present

## 2018-04-20 ENCOUNTER — Telehealth: Payer: Self-pay

## 2018-04-20 DIAGNOSIS — I10 Essential (primary) hypertension: Secondary | ICD-10-CM

## 2018-04-20 DIAGNOSIS — R7301 Impaired fasting glucose: Secondary | ICD-10-CM

## 2018-04-20 NOTE — Telephone Encounter (Signed)
Labs ordered.

## 2018-04-21 ENCOUNTER — Ambulatory Visit: Payer: Self-pay | Admitting: General Surgery

## 2018-04-27 DIAGNOSIS — I10 Essential (primary) hypertension: Secondary | ICD-10-CM | POA: Diagnosis not present

## 2018-04-27 DIAGNOSIS — R7301 Impaired fasting glucose: Secondary | ICD-10-CM | POA: Diagnosis not present

## 2018-04-28 LAB — BMP8+EGFR
BUN/Creatinine Ratio: 11 (ref 9–23)
BUN: 10 mg/dL (ref 6–24)
CO2: 23 mmol/L (ref 20–29)
Calcium: 9.6 mg/dL (ref 8.7–10.2)
Chloride: 96 mmol/L (ref 96–106)
Creatinine, Ser: 0.9 mg/dL (ref 0.57–1.00)
GFR calc Af Amer: 91 mL/min/{1.73_m2} (ref >59)
GFR calc non Af Amer: 79 mL/min/{1.73_m2} (ref >59)
Glucose: 227 mg/dL — ABNORMAL HIGH (ref 65–99)
Potassium: 4.1 mmol/L (ref 3.5–5.2)
Sodium: 136 mmol/L (ref 134–144)

## 2018-04-28 LAB — HEMOGLOBIN A1C
Est. average glucose Bld gHb Est-mCnc: 209 mg/dL
Hgb A1c MFr Bld: 8.9 % — ABNORMAL HIGH (ref 4.8–5.6)

## 2018-05-03 ENCOUNTER — Ambulatory Visit: Payer: BLUE CROSS/BLUE SHIELD | Admitting: "Endocrinology

## 2018-05-03 ENCOUNTER — Encounter: Payer: Self-pay | Admitting: "Endocrinology

## 2018-05-03 VITALS — BP 112/79 | HR 98 | Ht 64.5 in | Wt 235.0 lb

## 2018-05-03 DIAGNOSIS — E1165 Type 2 diabetes mellitus with hyperglycemia: Secondary | ICD-10-CM | POA: Diagnosis not present

## 2018-05-03 DIAGNOSIS — R5382 Chronic fatigue, unspecified: Secondary | ICD-10-CM | POA: Diagnosis not present

## 2018-05-03 DIAGNOSIS — D3502 Benign neoplasm of left adrenal gland: Secondary | ICD-10-CM | POA: Insufficient documentation

## 2018-05-03 DIAGNOSIS — E559 Vitamin D deficiency, unspecified: Secondary | ICD-10-CM | POA: Diagnosis not present

## 2018-05-03 MED ORDER — INSULIN GLARGINE 100 UNIT/ML SOLOSTAR PEN: 40.0000 [IU] | PEN_INJECTOR | Freq: Every day | SUBCUTANEOUS | 11 refills | Status: DC

## 2018-05-03 NOTE — Patient Instructions (Signed)

## 2018-05-03 NOTE — Progress Notes (Signed)
Endocrinology Consult Note       05/03/2018, 10:12 AM   Subjective:    Patient ID: Christina [name], female    DOB: 1976/07/06.  Christina [name] is being seen in consultation for management of currently uncontrolled symptomatic type 2 diabetes and bilateral adrenal adenomas requested by  [name], MD.   Past Medical History:  Diagnosis Date  . Anemia   . Bipolar disorder ([place])   . Depression   . Diabetes mellitus    type 2 DM, insulin only needed during pregnancy  . Gastroparesis   . 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 GERD (gastroesophageal reflux disease)   . H/O acute sinusitis 10/2016  . Hypertension   . Thyroid enlargement   . Wears glasses    Past Surgical History:  Procedure Laterality Date  . BREAST REDUCTION SURGERY  1994  . CESAREAN SECTION N/A 03/06/2016   Procedure: CESAREAN SECTION;  Surgeon: Ena Dawley, MD;  Location: [facility];  Service: Obstetrics;  Laterality: N/A;  . COLONOSCOPY WITH PROPOFOL N/A 10/26/2017   Dr. Oneida Alar: Torturous transverse and sigmoid colon, internal hemorrhoids.  Next colonoscopy in 10 years with MAC and color wrap  . dermoid tumor  2000  . DILITATION & CURRETTAGE/HYSTROSCOPY WITH NOVASURE ABLATION N/A 12/10/2016   Procedure: DILATATION & CURETTAGE/HYSTEROSCOPY WITH NOVASURE ABLATION;  Surgeon: Ena Dawley, MD;  Location: [facility];  Service: Gynecology;  Laterality: N/A;  . ESOPHAGOGASTRODUODENOSCOPY  07/01/2009   [id number] esphagus without barrett's/dilation with 16 mm/mild erthyema in the antrum. mild chronic gastritis on path.   . ESOPHAGOGASTRODUODENOSCOPY (EGD) WITH PROPOFOL N/A 10/26/2017   Dr. Oneida Alar: Gastritis, gastric polyps.  Biopsies benign.  Small bowel biopsies negative for celiac.  No H. pylori.  Marland Kitchen TRIGGER FINGER RELEASE Bilateral 06/2015  . TUBAL LIGATION Bilateral 03/06/2016   Procedure: BILATERAL TUBAL  LIGATION;  Surgeon: Ena Dawley, MD;  Location[place];  Service: Obstetrics;  Laterality: Bilateral;   Social History   Socioeconomic History  . Marital status: Married    Spouse name: Not on file  . Number of children: Not on file  . Years of education: Not on file  . Highest education level: Not on file  Occupational History  . Not on file  Social Needs  . Financial resource strain: Not on file  . Food insecurity:    Worry: Not on file    Inability: Not on file  . Transportation needs:    Medical: Not on file    Non-medical: Not on file  Tobacco Use  . Smoking status: Never Smoker  . Smokeless tobacco: Never Used  . Tobacco comment: Never smoked   Substance and Sexual Activity  . Alcohol use: Yes    Alcohol/week: 0.0 standard drinks    Comment: occ  . Drug use: No  . Sexual activity: Yes    Partners: Male    Birth control/protection: Surgical  Lifestyle  . Physical activity:    Days per week: Not on file    Minutes per session: Not on file  . Stress: Not on file  Relationships  .  Social connections:    Talks on phone: Not on file    Gets together: Not on file    Attends religious service: Not on file    Active member of club or organization: Not on file    Attends meetings of clubs or organizations: Not on file    Relationship status: Not on file  Other Topics Concern  . Not on file  Social History Narrative  . Not on file   Outpatient Encounter Medications as of 05/03/2018  Medication Sig  . acetaminophen (TYLENOL) 500 MG tablet Take 1,000 mg every 8 (eight) hours as needed by mouth for mild pain.   . ARIPiprazole (ABILIFY) 5 MG tablet Take 1 tablet (5 mg total) by mouth every evening.  . B-D UF III MINI PEN NEEDLES 31G X 5 MM MISC USE AS DIRECTED  . fluticasone (FLONASE) 50 MCG/ACT nasal spray Place 2 sprays into both nostrils daily.  Marland Kitchen glucose blood test strip Use as instructed, three times daily  . ibuprofen (ADVIL,MOTRIN) 200 MG tablet  Take 600 mg by mouth every 8 (eight) hours as needed (FOR PAIN.).  . Insulin Glargine (LANTUS) 100 UNIT/ML Solostar Pen Inject 40 Units into the skin at bedtime.  Marland Kitchen loratadine (CLARITIN) 10 MG tablet Take 1 tablet (10 mg total) by mouth daily. Taking generic otc allergy (Patient taking differently: Take 10 mg by mouth every evening. Taking generic otc allergy)  . losartan (COZAAR) 25 MG tablet Take 1 tablet (25 mg total) by mouth daily.  . metFORMIN (GLUCOPHAGE) 1000 MG tablet Take 1 tablet (1,000 mg total) by mouth 2 (two) times daily with a meal.  . montelukast (SINGULAIR) 10 MG tablet Take 10 mg by mouth at bedtime.  Marland Kitchen omeprazole (PRILOSEC) 20 MG capsule Take 1 capsule (20 mg total) by mouth daily before breakfast.  . ONETOUCH DELICA LANCETS 17P MISC Three times daily testing dx e11.9  . polyethylene glycol powder (GLYCOLAX/MIRALAX) powder Take 17 g by mouth daily. (Patient taking differently: Take 17 g by mouth daily as needed (for constipation.). )  . sodium chloride (OCEAN) 0.65 % SOLN nasal spray Place 1 spray into both nostrils 4 (four) times daily as needed for congestion.  . triamterene-hydrochlorothiazide (MAXZIDE-25) 37.5-25 MG tablet Take 1 tablet by mouth daily.  . valACYclovir (VALTREX) 500 MG tablet Take 1,000 mg by mouth daily as needed (for 3 days if needed for out breaks).   . [DISCONTINUED] glipiZIDE (GLUCOTROL XL) 10 MG 24 hr tablet TAKE 2 TABLETS BY MOUTH  EVERY MORNING AT BREAKFAT  . [DISCONTINUED] Insulin Glargine (LANTUS) 100 UNIT/ML Solostar Pen Inject 10 Units into the skin daily at 10 pm. (Patient taking differently: Inject 25 Units into the skin at bedtime. )   No facility-administered encounter medications on file as of 05/03/2018.     ALLERGIES: Allergies  Allergen Reactions  . Other Anaphylaxis    Tree nuts  . Peanut Oil Anaphylaxis  . Peanut-Containing Drug Products Anaphylaxis  . Augmentin [Amoxicillin-Pot Clavulanate] Nausea And Vomiting    Causes stomach  cramps. Patient can take amoxicillin, not Augmentin. Has patient had a PCN reaction causing immediate rash, facial/tongue/throat swelling, SOB or lightheadedness with hypotension: No Has patient had a PCN reaction causing severe rash involving mucus membranes or skin necrosis: No Has patient had a PCN reaction that required hospitalization: No Has patient had a PCN reaction occurring within the last 10 years: No If all of the above answers are "NO", then may proceed with [facility]  VACCINATION STATUS: Immunization History  Administered Date(s) Administered  . Influenza Split 06/13/2014  . Influenza Whole 04/29/2007, 04/30/2009, 04/01/2010  . Influenza,inj,Quad PF,6+ Mos 06/16/2013, 05/28/2015, 04/09/2016  . Influenza-Unspecified 04/24/2017  . MMR 06/17/2013  . Pneumococcal Polysaccharide-23 02/02/2005, 02/23/2011  . Td 01/01/2001  . Tdap 02/23/2011, 03/16/2013    Diabetes  She presents for her initial diabetic visit. She has type 2 diabetes mellitus. Onset time: She was diagnosed at approximate age of 61 years. Her disease course has been worsening. There are no hypoglycemic associated symptoms. Pertinent negatives for hypoglycemia include no confusion, headaches, pallor or seizures. Associated symptoms include fatigue, polydipsia and polyuria. Pertinent negatives for diabetes include no chest pain and no polyphagia. There are no hypoglycemic complications. Symptoms are worsening. Risk factors for coronary artery disease include diabetes mellitus, dyslipidemia, hypertension, obesity, sedentary lifestyle and family history. Current diabetic treatment includes insulin injections and oral agent (dual therapy) (She is currently on glipizide 10 mg p.o. twice daily, metformin 1000 mg p.o. twice daily, Lantus 25 units nightly.). Her weight is fluctuating minimally. When asked about meal planning, she reported none. She has not had a previous visit with a dietitian. She participates in exercise  intermittently. (She did not bring any meter nor logs to review.  Her most recent A1c was 8.9%.) An ACE inhibitor/angiotensin II receptor blocker is being taken.      Review of Systems  Constitutional: Positive for fatigue. Negative for chills, fever and unexpected weight change.  HENT: Negative for trouble swallowing and voice change.   Eyes: Negative for visual disturbance.  Respiratory: Negative for cough, shortness of breath and wheezing.   Cardiovascular: Negative for chest pain, palpitations and leg swelling.  Gastrointestinal: Negative for diarrhea, nausea and vomiting.  Endocrine: Positive for polydipsia and polyuria. Negative for cold intolerance, heat intolerance and polyphagia.  Musculoskeletal: Negative for arthralgias and myalgias.  Skin: Negative for color change, pallor, rash and wound.  Neurological: Negative for seizures and headaches.  Psychiatric/Behavioral: Negative for confusion and suicidal ideas.    Objective:    BP 112/79   Pulse 98   Ht 5' 4.5" (1.638 m)   Wt 235 lb (106.6 kg)   BMI 39.71 kg/m   Wt Readings from Last 3 Encounters:  05/03/18 235 lb (106.6 kg)  02/25/18 239 lb 9.6 oz (108.7 kg)  01/27/18 242 lb (109.8 kg)     Physical Exam  Constitutional: She is oriented to person, place, and time. She appears well-developed.  HENT:  Head: Normocephalic and atraumatic.  Eyes: EOM are normal.  Neck: Normal range of motion. Neck supple. No tracheal deviation present. No thyromegaly present.  Cardiovascular: Normal rate and regular rhythm.  Pulmonary/Chest: Effort normal and breath sounds normal.  Abdominal: Soft. Bowel sounds are normal. There is no tenderness. There is no guarding.  Reducible umbilical hernia in place.  Musculoskeletal: Normal range of motion. She exhibits no edema.  Neurological: She is alert and oriented to person, place, and time. She has normal reflexes. No cranial nerve deficit. Coordination normal.  Skin: Skin is warm and dry.  No rash noted. No erythema. No pallor.  Psychiatric: She has a normal mood and affect. Judgment normal.      CMP ( most recent) CMP     Component Value Date/Time   NA 136 04/27/2018 0823   K 4.1 04/27/2018 0823   CL 96 04/27/2018 0823   CO2 23 04/27/2018 0823   GLUCOSE 227 (H) 04/27/2018 0823   GLUCOSE 199 (H) 06/28/2017 1738  BUN 10 04/27/2018 0823   CREATININE 0.90 04/27/2018 0823   CREATININE 0.79 06/12/2016 0819   CALCIUM 9.6 04/27/2018 0823   PROT 6.7 01/25/2018 0848   ALBUMIN 4.2 01/25/2018 0848   AST 12 01/25/2018 0848   ALT 14 01/25/2018 0848   ALKPHOS 59 01/25/2018 0848   BILITOT 0.3 01/25/2018 0848   GFRNONAA 79 04/27/2018 0823   GFRNONAA >89 06/12/2016 0819   GFRAA 91 04/27/2018 0823   GFRAA >89 06/12/2016 0819     Diabetic Labs (most recent): Lab Results  Component Value Date   HGBA1C 8.9 (H) 04/27/2018   HGBA1C 8.5 (H) 01/25/2018   HGBA1C 8.3 (H) 10/04/2017     Lipid Panel ( most recent) Lipid Panel     Component Value Date/Time   CHOL 176 10/04/2017 0852   TRIG 120 10/04/2017 0852   HDL 64 10/04/2017 0852   CHOLHDL 2.8 10/04/2017 0852   CHOLHDL 1.7 03/16/2016 1115   VLDL 26 03/16/2016 1115   LDLCALC 88 10/04/2017 0852      Lab Results  Component Value Date   TSH 0.822 03/20/2017   TSH 0.79 03/16/2016   TSH 0.969 02/02/2015   TSH 0.826 08/09/2013   TSH 1.150 12/03/2009   TSH 1.440 10/13/2008    CT abdomen/pelvis in February 2015 showed 3.3 cm left adrenal adenoma. CT abdomen/pelvis on March 11, 2018 showed increased size of left adrenal adenoma to 4 cm and stable right adrenal adenoma of 2 cm.       Assessment & Plan:   1. Adenoma of left adrenal gland -Review of her VS abdomen/pelvis CT scans from February 2015 and August 2019 was performed with her. -He has a slowly growing left adrenal adenoma from 3.3 cm to 4 cm.  This adenoma was reported to have a Hounsfield unit of 42 in the past.  The right adrenal adenoma at 2 cm was  reported to be stable.  -I had a long discussion with her regarding the need for work-up of this bilateral adrenal adenomas to rule out malignancy and hyperfunctioning.   Suspicion for malignancy is low at this time.  However, she will need a functional analysis of these bilateral adrenal adenomas to rule out pheochromocytoma, Cushing syndrome, hyperaldosteronism.  Given her comorbidities including uncontrolled type 2 diabetes, chronic hypertension, obesity she would benefit from functional assessment of this bilateral adrenal lesions. -I will proceed with plasma metanephrines, thyroid function tests today and she will also collect 24-hour urine for measurement of catecholamines, metanephrines, aldosterone and cortisol.   2. Uncontrolled type 2 diabetes mellitus with hyperglycemia ([place])  - Christina [name] has currently uncontrolled symptomatic type 2 DM since 42 years of age,  with most recent A1c of 8.9 %. Recent labs reviewed.  -her diabetes is complicated by obesity/sedentary life and she remains at a high risk for more acute and chronic complications which include CAD, CVA, CKD, retinopathy, and neuropathy. These are all discussed in detail with her.  - I have counseled her on diet management and weight loss, by adopting a carbohydrate restricted/protein rich diet.  - Suggestion is made for her to avoid simple carbohydrates  from her diet including Cakes, Sweet Desserts, Ice Cream, Soda (diet and regular), Sweet Tea, Candies, Chips, Cookies, Store Bought Juices, Alcohol in Excess of  1-2 drinks a day, Artificial Sweeteners,  Coffee Creamer, and "Sugar-free" Products. This will help patient to have more stable blood glucose profile and potentially avoid unintended weight gain.  - I encouraged her to  switch to  unprocessed or minimally processed complex starch and increased protein intake (animal or plant source), fruits, and vegetables.  - she is advised to stick to a routine mealtimes to eat 3  meals  a day and avoid unnecessary snacks ( to snack only to correct hypoglycemia).    - I have approached her with the following individualized plan to manage diabetes and patient agrees:   -She is advised to increase her basal insulin Lantus to 40 units daily at bedtime , associated with strict monitoring of glucose times a day-before breakfast and at bedtime and return in 3 weeks with her meter and logs for reevaluation.    - she is encouraged to call clinic for blood glucose levels less than 70 or above 200 mg /dl. - I will continue forming 1000 mg p.o. twice daily, therapeutically suitable for patient . - I will discontinue glipizide, risk outweighs benefit for this patient. - she will be considered for incretin therapy as appropriate next visit. - Patient specific target  A1c;  LDL, HDL, Triglycerides, and  Waist Circumference were discussed in detail.  2) BP/HTN:  her blood pressure is controlled to target.   she is advised to continue her current medications including losartan 25 mg p.o. nightly, triamterene 25/37.5 mg p.o. Nightly at  breakfast . 3) Lipids/HPL:   Review of her recent lipid panel showed  controlled  LDL at 88 .  she  is not on statins.  Will be considered for low-dose statins on subsequent visits.   4)  Weight/Diet:  Body mass index is 39.71 kg/m.  - clearly complicating her diabetes care.  I discussed with her the fact that loss of 5 - 10% of her  current body weight will have the most impact on her diabetes management.  CDE Consult will be initiated . Exercise, and detailed carbohydrates information provided  -  detailed on discharge instructions.  5) Chronic Care/Health Maintenance:  -she  is on ARB and   is encouraged to initiate and continue to follow up with Ophthalmology, Dentist,  Podiatrist at least yearly or according to recommendations, and advised to  stay away from smoking. I have recommended yearly flu vaccine and pneumonia vaccine at least every 5 years;  moderate intensity exercise for up to 150 minutes weekly; and  sleep for at least 7 hours a day.  - I advised patient to maintain close follow up with [name], MD for primary care needs.  - Time spent with the patient: 45 minutes, of which >50% was spent in obtaining information about her symptoms, reviewing her previous labs, evaluations, and treatments, counseling her about her currently uncontrolled type 2 diabetes, bilateral adrenal adenomas, hypertension, obesity, and developing developing  plans for long term treatment based on the latest recommendations.  Glynda Jaeger Lyter participated in the discussions, expressed understanding, and voiced agreement with the above plans.  All questions were answered to her satisfaction. she is encouraged to contact clinic should she have any questions or concerns prior to her return visit.  Follow up plan: - Return in about 3 weeks (around 05/24/2018) for Meter, and Logs, Labs Today- Non-Fasting Ok.  Glade Lloyd, MD [facility] [address] [place], [place] [address] Phone: [phone]  Fax: [phone]    05/03/2018, 10:12 AM  This note was partially dictated with voice recognition software. Similar sounding words can be transcribed inadequately or may not  be corrected upon review.

## 2018-05-04 ENCOUNTER — Other Ambulatory Visit: Payer: Self-pay | Admitting: Family Medicine

## 2018-05-07 ENCOUNTER — Other Ambulatory Visit: Payer: Self-pay | Admitting: "Endocrinology

## 2018-05-07 DIAGNOSIS — D3502 Benign neoplasm of left adrenal gland: Secondary | ICD-10-CM | POA: Diagnosis not present

## 2018-05-09 ENCOUNTER — Other Ambulatory Visit: Payer: Self-pay | Admitting: "Endocrinology

## 2018-05-09 DIAGNOSIS — D3502 Benign neoplasm of left adrenal gland: Secondary | ICD-10-CM | POA: Diagnosis not present

## 2018-05-09 LAB — VITAMIN D 25 HYDROXY (VIT D DEFICIENCY, FRACTURES): Vit D, 25-Hydroxy: 30 ng/mL (ref 30.0–100.0)

## 2018-05-09 LAB — METANEPHRINES, PLASMA
Metanephrine, Free: <10 pg/mL (ref 0–62)
Normetanephrine, Free: 26 pg/mL (ref 0–145)

## 2018-05-09 LAB — TSH: TSH: 0.927 u[IU]/mL (ref 0.450–4.500)

## 2018-05-09 LAB — T4, FREE: Free T4: 1.24 ng/dL (ref 0.82–1.77)

## 2018-05-13 LAB — ALDOSTERONE, URINE
Aldosterone U,Random: 22.56 ug/L
Aldosterone, 24H Ur: 27.07 ug/24 hr — ABNORMAL HIGH (ref 0.00–19.00)

## 2018-05-13 LAB — METANEPHRINES, URINE, 24 HOUR
Metaneph Total, Ur: 93 ug/L
Metanephrines, 24H Ur: 112 ug/24 hr (ref 45–290)
Normetanephrine, 24H Ur: 251 ug/24 hr (ref 82–500)
Normetanephrine, Ur: 209 ug/L

## 2018-05-13 LAB — CREATININE, URINE, 24 HOUR
Creatinine, 24H Ur: 1798 mg/24 hr (ref 800–1800)
Creatinine, Urine: 149.8 mg/dL

## 2018-05-13 LAB — CORTISOL, URINE, FREE
Cortisol (Ur), Free: 25 ug/24 hr (ref 6–42)
Cortisol,F,ug/L,U: 21 ug/L

## 2018-05-16 LAB — CATECHOLAMINES, FRACTIONATED, URINE, 24 HOUR
Dopamine , 24H Ur: 313 ug/24 hr (ref 0–510)
Dopamine, Rand Ur: 241 ug/L
Epinephrine, 24H Ur: 7 ug/24 hr (ref 0–20)
Epinephrine, Rand Ur: 5 ug/L
Norepinephrine, 24H Ur: 29 ug/24 hr (ref 0–135)
Norepinephrine, Rand Ur: 22 ug/L

## 2018-05-18 ENCOUNTER — Ambulatory Visit (HOSPITAL_COMMUNITY): Payer: Self-pay | Admitting: Psychology

## 2018-05-24 ENCOUNTER — Ambulatory Visit (INDEPENDENT_AMBULATORY_CARE_PROVIDER_SITE_OTHER): Payer: BLUE CROSS/BLUE SHIELD | Admitting: "Endocrinology

## 2018-05-24 ENCOUNTER — Encounter: Payer: Self-pay | Admitting: "Endocrinology

## 2018-05-24 VITALS — BP 123/81 | HR 81 | Ht 64.5 in | Wt 234.0 lb

## 2018-05-24 DIAGNOSIS — E1165 Type 2 diabetes mellitus with hyperglycemia: Secondary | ICD-10-CM | POA: Diagnosis not present

## 2018-05-24 DIAGNOSIS — D3502 Benign neoplasm of left adrenal gland: Secondary | ICD-10-CM

## 2018-05-24 MED ORDER — INSULIN GLARGINE 100 UNIT/ML SOLOSTAR PEN: 50.0000 [IU] | PEN_INJECTOR | Freq: Every day | SUBCUTANEOUS | 11 refills | Status: DC

## 2018-05-24 NOTE — Patient Instructions (Signed)

## 2018-05-24 NOTE — Progress Notes (Signed)
Endocrinology follow-up note      05/24/2018, 4:45 PM   Subjective:    Patient ID: Christina [name], female    DOB: 08-02-1976.  Christina [name] is being seen in consultation for management of currently uncontrolled symptomatic type 2 diabetes and bilateral adrenal adenomas requested by  [name], MD.   Past Medical History:  Diagnosis Date  . Anemia   . Bipolar disorder ([place])   . Depression   . Diabetes mellitus    type 2 DM, insulin only needed during pregnancy  . Gastroparesis   . 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 GERD (gastroesophageal reflux disease)   . H/O acute sinusitis 10/2016  . Hypertension   . Thyroid enlargement   . Wears glasses    Past Surgical History:  Procedure Laterality Date  . BREAST REDUCTION SURGERY  1994  . CESAREAN SECTION N/A 03/06/2016   Procedure: CESAREAN SECTION;  Surgeon: Ena Dawley, MD;  Location: [facility];  Service: Obstetrics;  Laterality: N/A;  . COLONOSCOPY WITH PROPOFOL N/A 10/26/2017   Dr. Oneida Alar: Torturous transverse and sigmoid colon, internal hemorrhoids.  Next colonoscopy in 10 years with MAC and color wrap  . dermoid tumor  2000  . DILITATION & CURRETTAGE/HYSTROSCOPY WITH NOVASURE ABLATION N/A 12/10/2016   Procedure: DILATATION & CURETTAGE/HYSTEROSCOPY WITH NOVASURE ABLATION;  Surgeon: Ena Dawley, MD;  Location: [facility];  Service: Gynecology;  Laterality: N/A;  . ESOPHAGOGASTRODUODENOSCOPY  07/01/2009   HCW:CBJSEG esphagus without barrett's/dilation with 16 mm/mild erthyema in the antrum. mild chronic gastritis on path.   . ESOPHAGOGASTRODUODENOSCOPY (EGD) WITH PROPOFOL N/A 10/26/2017   Dr. Oneida Alar: Gastritis, gastric polyps.  Biopsies benign.  Small bowel biopsies negative for celiac.  No H. pylori.  Marland Kitchen TRIGGER FINGER RELEASE Bilateral 06/2015  . TUBAL LIGATION Bilateral 03/06/2016   Procedure: BILATERAL TUBAL  LIGATION;  Surgeon: Ena Dawley, MD;  Location: [facility];  Service: Obstetrics;  Laterality: Bilateral;   Social History   Socioeconomic History  . Marital status: Married    Spouse name: Not on file  . Number of children: Not on file  . Years of education: Not on file  . Highest education level: Not on file  Occupational History  . Not on file  Social Needs  . Financial resource strain: Not on file  . Food insecurity:    Worry: Not on file    Inability: Not on file  . Transportation needs:    Medical: Not on file    Non-medical: Not on file  Tobacco Use  . Smoking status: Never Smoker  . Smokeless tobacco: Never Used  . Tobacco comment: Never smoked   Substance and Sexual Activity  . Alcohol use: Yes    Alcohol/week: 0.0 standard drinks    Comment: occ  . Drug use: No  . Sexual activity: Yes    Partners: Male    Birth control/protection: Surgical  Lifestyle  . Physical activity:    Days per week: Not on file    Minutes per session: Not on file  . Stress: Not on file  Relationships  . Social  connections:    Talks on phone: Not on file    Gets together: Not on file    Attends religious service: Not on file    Active member of club or organization: Not on file    Attends meetings of clubs or organizations: Not on file    Relationship status: Not on file  Other Topics Concern  . Not on file  Social History Narrative  . Not on file   Outpatient Encounter Medications as of 05/24/2018  Medication Sig  . acetaminophen (TYLENOL) 500 MG tablet Take 1,000 mg every 8 (eight) hours as needed by mouth for mild pain.   . ARIPiprazole (ABILIFY) 5 MG tablet Take 1 tablet (5 mg total) by mouth every evening.  . B-D UF III MINI PEN NEEDLES 31G X 5 MM MISC USE AS DIRECTED  . fluticasone (FLONASE) 50 MCG/ACT nasal spray Place 2 sprays into both nostrils daily.  Marland Kitchen glucose blood test strip Use as instructed, three times daily  . ibuprofen (ADVIL,MOTRIN) 200 MG tablet  Take 600 mg by mouth every 8 (eight) hours as needed (FOR PAIN.).  . Insulin Glargine (LANTUS) 100 UNIT/ML Solostar Pen Inject 50 Units into the skin at bedtime.  Marland Kitchen loratadine (CLARITIN) 10 MG tablet Take 1 tablet (10 mg total) by mouth daily. Taking generic otc allergy (Patient taking differently: Take 10 mg by mouth every evening. Taking generic otc allergy)  . losartan (COZAAR) 25 MG tablet Take 1 tablet (25 mg total) by mouth daily.  . metFORMIN (GLUCOPHAGE) 1000 MG tablet Take 1 tablet (1,000 mg total) by mouth 2 (two) times daily with a meal.  . montelukast (SINGULAIR) 10 MG tablet Take 10 mg by mouth at bedtime.  Marland Kitchen omeprazole (PRILOSEC) 20 MG capsule Take 1 capsule (20 mg total) by mouth daily before breakfast.  . ONETOUCH DELICA LANCETS 71I MISC Three times daily testing dx e11.9  . polyethylene glycol powder (GLYCOLAX/MIRALAX) powder Take 17 g by mouth daily. (Patient taking differently: Take 17 g by mouth daily as needed (for constipation.). )  . sodium chloride (OCEAN) 0.65 % SOLN nasal spray Place 1 spray into both nostrils 4 (four) times daily as needed for congestion.  . triamterene-hydrochlorothiazide (MAXZIDE-25) 37.5-25 MG tablet TAKE 1 TABLET BY MOUTH ONCE DAILY  . valACYclovir (VALTREX) 500 MG tablet Take 1,000 mg by mouth daily as needed (for 3 days if needed for out breaks).   . [DISCONTINUED] Insulin Glargine (LANTUS) 100 UNIT/ML Solostar Pen Inject 40 Units into the skin at bedtime.   No facility-administered encounter medications on file as of 05/24/2018.     ALLERGIES: Allergies  Allergen Reactions  . Other Anaphylaxis    Tree nuts  . Peanut Oil Anaphylaxis  . Peanut-Containing Drug Products Anaphylaxis  . Augmentin [Amoxicillin-Pot Clavulanate] Nausea And Vomiting    Causes stomach cramps. Patient can take amoxicillin, not Augmentin. Has patient had a PCN reaction causing immediate rash, facial/tongue/throat swelling, SOB or lightheadedness with hypotension:  No Has patient had a PCN reaction causing severe rash involving mucus membranes or skin necrosis: No Has patient had a PCN reaction that required hospitalization: No Has patient had a PCN reaction occurring within the last 10 years: No If all of the above answers are "NO", then may proceed with Cephalospori    VACCINATION STATUS: Immunization History  Administered Date(s) Administered  . Influenza Split 06/13/2014  . Influenza Whole 04/29/2007, 04/30/2009, 04/01/2010  . Influenza,inj,Quad PF,6+ Mos 06/16/2013, 05/28/2015, 04/09/2016  . Influenza-Unspecified 04/24/2017  . MMR  06/17/2013  . Pneumococcal Polysaccharide-23 02/02/2005, 02/23/2011  . Td 01/01/2001  . Tdap 02/23/2011, 03/16/2013    Diabetes  She presents for her follow-up diabetic visit. She has type 2 diabetes mellitus. Onset time: She was diagnosed at approximate age of 1 years. Her disease course has been worsening. There are no hypoglycemic associated symptoms. Pertinent negatives for hypoglycemia include no confusion, headaches, pallor or seizures. Associated symptoms include fatigue, polydipsia and polyuria. Pertinent negatives for diabetes include no chest pain and no polyphagia. There are no hypoglycemic complications. Symptoms are worsening. Risk factors for coronary artery disease include diabetes mellitus, dyslipidemia, hypertension, obesity, sedentary lifestyle and family history. Current diabetic treatment includes insulin injections and oral agent (dual therapy) (She is currently on glipizide 10 mg p.o. twice daily, metformin 1000 mg p.o. twice daily, Lantus 25 units nightly.). Her weight is fluctuating minimally. When asked about meal planning, she reported none. She has not had a previous visit with a dietitian. She participates in exercise intermittently. (She brings in a meter showing significantly above target fasting and postprandial glycemic profile.  Her most recent A1c was 8.9%.    ) An ACE  inhibitor/angiotensin II receptor blocker is being taken.     Review of Systems  Constitutional: Positive for fatigue. Negative for chills, fever and unexpected weight change.  HENT: Negative for trouble swallowing and voice change.   Eyes: Negative for visual disturbance.  Respiratory: Negative for cough, shortness of breath and wheezing.   Cardiovascular: Negative for chest pain, palpitations and leg swelling.  Gastrointestinal: Negative for diarrhea, nausea and vomiting.  Endocrine: Positive for polydipsia and polyuria. Negative for cold intolerance, heat intolerance and polyphagia.  Musculoskeletal: Negative for arthralgias and myalgias.  Skin: Negative for color change, pallor, rash and wound.  Neurological: Negative for seizures and headaches.  Psychiatric/Behavioral: Negative for confusion and suicidal ideas.    Objective:    BP 123/81   Pulse 81   Ht 5' 4.5" (1.638 m)   Wt 234 lb (106.1 kg)   BMI 39.55 kg/m   Wt Readings from Last 3 Encounters:  05/24/18 234 lb (106.1 kg)  05/03/18 235 lb (106.6 kg)  02/25/18 239 lb 9.6 oz (108.7 kg)     Physical Exam  Constitutional: She is oriented to person, place, and time. She appears well-developed.  HENT:  Head: Normocephalic and atraumatic.  Eyes: EOM are normal.  Neck: Normal range of motion. Neck supple. No tracheal deviation present. No thyromegaly present.  Cardiovascular: Normal rate and regular rhythm.  Pulmonary/Chest: Effort normal and breath sounds normal.  Abdominal: Soft. Bowel sounds are normal. There is no tenderness. There is no guarding.  Reducible umbilical hernia in place.  Musculoskeletal: Normal range of motion. She exhibits no edema.  Neurological: She is alert and oriented to person, place, and time. She has normal reflexes. No cranial nerve deficit. Coordination normal.  Skin: Skin is warm and dry. No rash noted. No erythema. No pallor.  Psychiatric: She has a normal mood and affect. Judgment normal.        CMP     Component Value Date/Time   NA 136 04/27/2018 0823   K 4.1 04/27/2018 0823   CL 96 04/27/2018 0823   CO2 23 04/27/2018 0823   GLUCOSE 227 (H) 04/27/2018 0823   GLUCOSE 199 (H) 06/28/2017 1738   BUN 10 04/27/2018 0823   CREATININE 0.90 04/27/2018 0823   CREATININE 0.79 06/12/2016 0819   CALCIUM 9.6 04/27/2018 0823   PROT 6.7 01/25/2018 0848  ALBUMIN 4.2 01/25/2018 0848   AST 12 01/25/2018 0848   ALT 14 01/25/2018 0848   ALKPHOS 59 01/25/2018 0848   BILITOT 0.3 01/25/2018 0848   GFRNONAA 79 04/27/2018 0823   GFRNONAA >89 06/12/2016 0819   GFRAA 91 04/27/2018 0823   GFRAA >89 06/12/2016 0819     Diabetic Labs (most recent): Lab Results  Component Value Date   HGBA1C 8.9 (H) 04/27/2018   HGBA1C 8.5 (H) 01/25/2018   HGBA1C 8.3 (H) 10/04/2017     Lipid Panel ( most recent) Lipid Panel     Component Value Date/Time   CHOL 176 10/04/2017 0852   TRIG 120 10/04/2017 0852   HDL 64 10/04/2017 0852   CHOLHDL 2.8 10/04/2017 0852   CHOLHDL 1.7 03/16/2016 1115   VLDL 26 03/16/2016 1115   LDLCALC 88 10/04/2017 0852      Lab Results  Component Value Date   TSH 0.927 05/03/2018   TSH 0.822 03/20/2017   TSH 0.79 03/16/2016   TSH 0.969 02/02/2015   TSH 0.826 08/09/2013   TSH 1.150 12/03/2009   TSH 1.440 10/13/2008   FREET4 1.24 05/03/2018    CT abdomen/pelvis in February 2015 showed 3.3 cm left adrenal adenoma. CT abdomen/pelvis on March 11, 2018 showed increased size of left adrenal adenoma to 4 cm and stable right adrenal adenoma of 2 cm.     Recent Results (from the past 2160 hour(s))  I-STAT creatinine     Status: None   Collection Time: 03/11/18  4:28 PM  Result Value Ref Range   Creatinine, Ser 0.80 0.44 - 1.00 mg/dL  Hemoglobin A1c     Status: Abnormal   Collection Time: 04/27/18  8:23 AM  Result Value Ref Range   Hgb A1c MFr Bld 8.9 (H) 4.8 - 5.6 %    Comment:          Prediabetes: 5.7 - 6.4          Diabetes: >6.4          Glycemic  control for adults with diabetes: <7.0    Est. average glucose Bld gHb Est-mCnc 209 mg/dL  BMP8+EGFR     Status: Abnormal   Collection Time: 04/27/18  8:23 AM  Result Value Ref Range   Glucose 227 (H) 65 - 99 mg/dL   BUN 10 6 - 24 mg/dL   Creatinine, Ser 0.90 0.57 - 1.00 mg/dL   GFR calc non Af Amer 79 >59 mL/min/1.73   GFR calc Af Amer 91 >59 mL/min/1.73   BUN/Creatinine Ratio 11 9 - 23   Sodium 136 134 - 144 mmol/L   Potassium 4.1 3.5 - 5.2 mmol/L   Chloride 96 96 - 106 mmol/L   CO2 23 20 - 29 mmol/L   Calcium 9.6 8.7 - 10.2 mg/dL  Metanephrines, plasma     Status: None   Collection Time: 05/03/18 10:06 AM  Result Value Ref Range   Normetanephrine, Free 26 0 - 145 pg/mL   Metanephrine, Free <10 0 - 62 pg/mL    Comment: Concentrations of Normetanephrine between 146 and 487 pg/mL, and Metanephrine between 63 and 255 pg/mL are considered indeterminate. Follow-up biochemical testing is recommended when patient levels fall within this indeterminate range. These tests include repeat testing of plasma/urinary fractionated metanephrines and plasma catecholamines.   T4, free     Status: None   Collection Time: 05/03/18 10:06 AM  Result Value Ref Range   Free T4 1.24 0.82 - 1.77 ng/dL  TSH  Status: None   Collection Time: 05/03/18 10:06 AM  Result Value Ref Range   TSH 0.927 0.450 - 4.500 uIU/mL  VITAMIN D 25 Hydroxy (Vit-D Deficiency, Fractures)     Status: None   Collection Time: 05/03/18 10:06 AM  Result Value Ref Range   Vit D, 25-Hydroxy 30.0 30.0 - 100.0 ng/mL    Comment: Vitamin D deficiency has been defined by the [place] practice guideline as a level of serum 25-OH vitamin D less than 20 ng/mL (1,2). The Endocrine Society went on to further define vitamin D insufficiency as a level between 21 and 29 ng/mL (2). 1. IOM (Institute of Medicine). 2010. Dietary reference    intakes for calcium and D. [place]: The    [employer]. 2. Holick MF, Binkley [place], Bischoff-Ferrari HA, et al.    Evaluation, treatment, and prevention of vitamin D    deficiency: an Endocrine Society clinical practice    guideline. JCEM. 2011 Jul; 96(7):1911-30.   Metanephrines, urine, 24 hour     Status: None   Collection Time: 05/07/18  9:00 AM  Result Value Ref Range   Normetanephrine, Ur 209 Undefined ug/L   Normetanephrine, 24H Ur 251 82 - 500 ug/24 hr    Comment:      (Hypertensive) >17 years 11 months:    110 - 1050   Metaneph Total, Ur 93 Undefined ug/L   Metanephrines, 24H Ur 112 45 - 290 ug/24 hr    Comment:      (Hypertensive) >17 years 11 months:     35 -  460  Creatinine, urine, 24 hour     Status: None   Collection Time: 05/07/18  9:00 AM  Result Value Ref Range   Creatinine, Urine 149.8 Not Estab. mg/dL   Creatinine, 24H Ur 1,798 800 - 1,800 mg/24 hr  Aldosterone, Urine     Status: Abnormal   Collection Time: 05/07/18  9:00 AM  Result Value Ref Range   Aldosterone U,Random 22.56 Not Estab. ug/L    Comment: This test was developed and its performance characteristics determined by [facility]. It has not been cleared or approved by the Food and Drug Administration.    Aldosterone, 24H Ur 27.07 (H) 0.00 - 19.00 ug/24 hr    Comment:                                  Adult Ranges                     Low Sodium Intake     20.00 - 80.00                     Normal Sodium Intake   0.00 - 19.00                     High Sodium Intake     0.00 - 12.00   Cortisol, urine, free     Status: None   Collection Time: 05/07/18  9:00 AM  Result Value Ref Range   Cortisol,F,ug/L,U 21 Undefined ug/L    Comment: This test was developed and its performance characteristics determined by [facility]. It has not been cleared or approved by the Food and Drug Administration.    Cortisol (Ur), Free 25 6 - 42 ug/24 hr  Catecholamines, fractionated, urine, 24 hour     Status:  None   Collection Time: 05/09/18 10:00 AM  Result Value Ref  Range   Epinephrine, Rand Ur 5 Undefined ug/L   Epinephrine, 24H Ur 7 0 - 20 ug/24 hr   Norepinephrine, Rand Ur 22 Undefined ug/L   Norepinephrine, 24H Ur 29 0 - 135 ug/24 hr   Dopamine, Rand Ur 241 Undefined ug/L   Dopamine , 24H Ur 313 0 - 510 ug/24 hr     Assessment & Plan:   1. Adenoma of left adrenal gland -Review of her VS abdomen/pelvis CT scans from February 2015 and August 2019 was performed with her. -He has a slowly growing left adrenal adenoma from 3.3 cm to 4 cm.  This adenoma was reported to have a Hounsfield unit of 42 in the past.  The right adrenal adenoma at 2 cm was reported to be stable.  -I had a long discussion with her regarding the need for work-up of this bilateral adrenal adenomas to rule out malignancy and hyperfunctioning.   Suspicion for malignancy is low at this time.  Functional work-up of bilateral adrenal adenomas reveals normal metanephrines and catecholamines, slightly elevated 24-hour urine aldosterone at 27.07.  Random urine aldosterone was normal at 22.56. Plasma metanephrines were within normal limits as well as 24-hour urine free cortisol.  Pheochromocytoma and Cushing's syndrome ruled out. -She will need repeat work-up to rule out primary hyperaldosteronism.  Prior to her next visit she will go to rule out for plasma aldosterone concentration, plasma renin activity, and PAC/PRA ratio. -If her next labs indicate possibility of primary hyperaldosteronism, she will need confirmatory tests.  Her previous imaging studies already show bilateral adrenal tumors.  2. Uncontrolled type 2 diabetes mellitus with hyperglycemia ([place])  - Christina [name] has currently uncontrolled symptomatic type 2 DM since 42 years of age. -Please patient presents with significantly above target glycemic profile both fasting and postprandial.  Her most recent A1c was 8.9%.   -her diabetes is complicated by obesity/sedentary life and she remains at a high risk for more acute and  chronic complications which include CAD, CVA, CKD, retinopathy, and neuropathy. These are all discussed in detail with her.  - I have counseled her on diet management and weight loss, by adopting a carbohydrate restricted/protein rich diet.  -  Suggestion is made for her to avoid simple carbohydrates  from her diet including Cakes, Sweet Desserts / Pastries, Ice Cream, Soda (diet and regular), Sweet Tea, Candies, Chips, Cookies, Store Bought Juices, Alcohol in Excess of  1-2 drinks a day, Artificial Sweeteners, and "Sugar-free" Products. This will help patient to have stable blood glucose profile and potentially avoid unintended weight gain.   - I encouraged her to switch to  unprocessed or minimally processed complex starch and increased protein intake (animal or plant source), fruits, and vegetables.  - she is advised to stick to a routine mealtimes to eat 3 meals  a day and avoid unnecessary snacks ( to snack only to correct hypoglycemia).    - I have approached her with the following individualized plan to manage diabetes and patient agrees:   -She is advised to increase her Lantus to 50 units nightly, associated with strict monitoring of glucose 2  times a day-before breakfast and at bedtime.  - she is encouraged to call clinic for blood glucose levels less than 70 or above 200 mg /dl. - I will continue metformin 1000 mg p.o. twice daily,  therapeutically suitable for patient .  -She reports prior pancreatitis from [facility]  and [facility] and declines offer of weekly Trulicity and Ozempic.  - Patient specific target  A1c;  LDL, HDL, Triglycerides, and  Waist Circumference were discussed in detail.  2) BP/HTN:  her blood pressure is controlled to target.   she is advised to continue her current medications including losartan 25 mg p.o. nightly, triamterene 25/37.5 mg p.o. Nightly at  breakfast . 3) Lipids/HPL:   Review of her recent lipid panel showed  controlled  LDL at 88 .  she  is not on  statins.  Will be considered for low-dose statins on subsequent visits.   4)  Weight/Diet:  Body mass index is 39.55 kg/m.  - clearly complicating her diabetes care.  I discussed with her the fact that loss of 5 - 10% of her  current body weight will have the most impact on her diabetes management.  CDE Consult will be initiated . Exercise, and detailed carbohydrates information provided  -  detailed on discharge instructions.  5) Chronic Care/Health Maintenance:  -she  is on ARB and   is encouraged to initiate and continue to follow up with Ophthalmology, Dentist,  Podiatrist at least yearly or according to recommendations, and advised to  stay away from smoking. I have recommended yearly flu vaccine and pneumonia vaccine at least every 5 years; moderate intensity exercise for up to 150 minutes weekly; and  sleep for at least 7 hours a day.  - I advised patient to maintain close follow up with [name], MD for primary care needs.  - Time spent with the patient: 25 min, of which >50% was spent in reviewing her blood glucose logs , discussing her hypo- and hyper-glycemic episodes, reviewing her current and  previous labs and insulin doses and developing a plan to avoid hypo- and hyper-glycemia. Please refer to Patient Instructions for Blood Glucose Monitoring and Insulin/Medications Dosing Guide"  in media tab for additional information. Christina [name] participated in the discussions, expressed understanding, and voiced agreement with the above plans.  All questions were answered to her satisfaction. she is encouraged to contact clinic should she have any questions or concerns prior to her return visit.   Follow up plan: - Return in about 3 months (around 08/24/2018) for Follow up with Pre-visit Labs, Meter, and Logs.  Glade Lloyd, MD [facility] [address] [place], [place] [address] Phone: [phone]  Fax: [phone]     05/24/2018, 4:45 PM  This note was partially dictated with voice recognition software. Similar sounding words can be transcribed inadequately or may not  be corrected upon review.

## 2018-05-31 ENCOUNTER — Ambulatory Visit (HOSPITAL_COMMUNITY): Payer: BLUE CROSS/BLUE SHIELD | Admitting: Psychiatry

## 2018-05-31 ENCOUNTER — Encounter (HOSPITAL_COMMUNITY): Payer: Self-pay | Admitting: Psychiatry

## 2018-05-31 VITALS — BP 117/84 | HR 84 | Ht 64.5 in | Wt 234.0 lb

## 2018-05-31 DIAGNOSIS — F411 Generalized anxiety disorder: Secondary | ICD-10-CM

## 2018-05-31 DIAGNOSIS — F3162 Bipolar disorder, current episode mixed, moderate: Secondary | ICD-10-CM | POA: Diagnosis not present

## 2018-05-31 MED ORDER — ARIPIPRAZOLE 5 MG PO TABS: 5.0000 mg | ORAL_TABLET | Freq: Every evening | ORAL | 0 refills | Status: DC

## 2018-05-31 NOTE — Progress Notes (Signed)
[place] MD/PA/NP OP Progress Note  05/31/2018 11:37 AM Christina [name]  MRN:  [id number]  Chief Complaint: I am taking the medication.  Some days I am anxious and nervous.  I am doing too many things.  I started seeing therapist.  HPI: Patient came for her follow-up appointment.  She is compliant with Abilify 5 mg which is helping her paranoia, mood and irritability.  However she noticed recently her anxiety is increased.  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  Patient admitted that has been stressful.  She has cut down from her family gathering and events because she wants to focus on her own life and education.  She is doing masters in counseling and she like to work as a Social worker in the future.  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  Her sleep is okay.  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  She also recently diagnosed with a medical hernia and she may have the surgery in few weeks.  Recently her insulin dose is increased because her hemoglobin A1c is increased.  She denies drinking or using any illegal substances.  Her energy level is fair.  She denies any crying spells or any suicidal thoughts.  Her husband is very supportive.  Visit Diagnosis:    ICD-10-CM   1. Bipolar 1 disorder, mixed, moderate (HCC) F31.62 ARIPiprazole (ABILIFY) 5 MG tablet  2. GAD (generalized anxiety disorder) F41.1     Past Psychiatric History: Reviewed Patient has been seeing in this office since 2006. She was referred from her primary care physician Dr. Moshe Cipro. She has history of depression and mania and psychosis. In 1997 she was admitted at [facility] because of suicidal gesture. In the past she has taken Prozac and Wellbutrin. She denies any history of sexual, verbal emotional abuse.  Past Medical History:  Past Medical History:  Diagnosis Date   . Anemia   . Bipolar disorder ([place])   . Depression   . Diabetes mellitus    type 2 DM, insulin only needed during pregnancy  . Gastroparesis   . 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 GERD (gastroesophageal reflux disease)   . H/O acute sinusitis 10/2016  . Hypertension   . Thyroid enlargement   . Wears glasses     Past Surgical History:  Procedure Laterality Date  . BREAST REDUCTION SURGERY  1994  . CESAREAN SECTION N/A 03/06/2016   Procedure: CESAREAN SECTION;  Surgeon: Ena Dawley, MD;  Location: [place];  Service: Obstetrics;  Laterality: N/A;  . COLONOSCOPY WITH PROPOFOL N/A 10/26/2017   Dr. Oneida Alar: Torturous transverse and sigmoid colon, internal hemorrhoids.  Next colonoscopy in 10 years with MAC and color wrap  . dermoid tumor  2000  . DILITATION & CURRETTAGE/HYSTROSCOPY WITH NOVASURE ABLATION N/A 12/10/2016   Procedure: DILATATION & CURETTAGE/HYSTEROSCOPY WITH NOVASURE ABLATION;  Surgeon: Ena Dawley, MD;  Location: [facility];  Service: Gynecology;  Laterality: N/A;  . ESOPHAGOGASTRODUODENOSCOPY  07/01/2009   HDQ:QIWLNL esphagus without barrett's/dilation with 16 mm/mild erthyema in the antrum. mild chronic gastritis on path.   . ESOPHAGOGASTRODUODENOSCOPY (EGD) WITH PROPOFOL N/A 10/26/2017   Dr. Oneida Alar: Gastritis, gastric polyps.  Biopsies benign.  Small bowel biopsies negative for celiac.  No H. pylori.  Marland Kitchen TRIGGER FINGER RELEASE Bilateral 06/2015  . TUBAL  LIGATION Bilateral 03/06/2016   Procedure: BILATERAL TUBAL LIGATION;  Surgeon: Ena Dawley, MD;  Location: [place];  Service: Obstetrics;  Laterality: Bilateral;    Family Psychiatric History: Reviewed  Family History:  Family History  Problem Relation Age of Onset  . Diabetes Mother   . Hypertension Mother   . Fibromyalgia Mother   . Stroke Mother   . Diabetes Father   . Hypertension Father   . Asthma Father   . Heart disease Father   . Depression Father   .  Colon cancer Neg Hx     Social History:  Social History   Socioeconomic History  . Marital status: Married    Spouse name: Not on file  . Number of children: Not on file  . Years of education: Not on file  . Highest education level: Not on file  Occupational History  . Not on file  Social Needs  . Financial resource strain: Not on file  . Food insecurity:    Worry: Not on file    Inability: Not on file  . Transportation needs:    Medical: Not on file    Non-medical: Not on file  Tobacco Use  . Smoking status: Never Smoker  . Smokeless tobacco: Never Used  . Tobacco comment: Never smoked   Substance and Sexual Activity  . Alcohol use: Yes    Alcohol/week: 0.0 standard drinks    Comment: occ  . Drug use: No  . Sexual activity: Yes    Partners: Male    Birth control/protection: Surgical  Lifestyle  . Physical activity:    Days per week: Not on file    Minutes per session: Not on file  . Stress: Not on file  Relationships  . Social connections:    Talks on phone: Not on file    Gets together: Not on file    Attends religious service: Not on file    Active member of club or organization: Not on file    Attends meetings of clubs or organizations: Not on file    Relationship status: Not on file  Other Topics Concern  . Not on file  Social History Narrative  . Not on file    Allergies:  Allergies  Allergen Reactions  . Other Anaphylaxis    Tree nuts  . Peanut Oil Anaphylaxis  . Peanut-Containing Drug Products Anaphylaxis  . Augmentin [Amoxicillin-Pot Clavulanate] Nausea And Vomiting    Causes stomach cramps. Patient can take amoxicillin, not Augmentin. Has patient had a PCN reaction causing immediate rash, facial/tongue/throat swelling, SOB or lightheadedness with hypotension: No Has patient had a PCN reaction causing severe rash involving mucus membranes or skin necrosis: No Has patient had a PCN reaction that required hospitalization: No Has patient had a  PCN reaction occurring within the last 10 years: No If all of the above answers are "NO", then may proceed with Cephalospori    Metabolic Disorder Labs: Lab Results  Component Value Date   HGBA1C 8.9 (H) 04/27/2018   MPG 154 06/12/2016   MPG 137 03/16/2016   No results found for: PROLACTIN Lab Results  Component Value Date   CHOL 176 10/04/2017   TRIG 120 10/04/2017   HDL 64 10/04/2017   CHOLHDL 2.8 10/04/2017   VLDL 26 03/16/2016   LDLCALC 88 10/04/2017   LDLCALC 48 07/07/2017   Lab Results  Component Value Date   TSH 0.927 05/03/2018   TSH 0.822 03/20/2017    Therapeutic Level Labs: No results  found for: LITHIUM No results found for: VALPROATE No components found for:  CBMZ  Current Medications: Current Outpatient Medications  Medication Sig Dispense Refill  . acetaminophen (TYLENOL) 500 MG tablet Take 1,000 mg every 8 (eight) hours as needed by mouth for mild pain.     . ARIPiprazole (ABILIFY) 5 MG tablet Take 1 tablet (5 mg total) by mouth every evening. 90 tablet 0  . B-D UF III MINI PEN NEEDLES 31G X 5 MM MISC USE AS DIRECTED 100 each 2  . fluticasone (FLONASE) 50 MCG/ACT nasal spray Place 2 sprays into both nostrils daily. 16 g 6  . glucose blood test strip Use as instructed, three times daily 300 each 12  . ibuprofen (ADVIL,MOTRIN) 200 MG tablet Take 600 mg by mouth every 8 (eight) hours as needed (FOR PAIN.).    . Insulin Glargine (LANTUS) 100 UNIT/ML Solostar Pen Inject 50 Units into the skin at bedtime. 15 mL 11  . loratadine (CLARITIN) 10 MG tablet Take 1 tablet (10 mg total) by mouth daily. Taking generic otc allergy (Patient taking differently: Take 10 mg by mouth every evening. Taking generic otc allergy) 90 tablet 1  . losartan (COZAAR) 25 MG tablet Take 1 tablet (25 mg total) by mouth daily. 90 tablet 3  . metFORMIN (GLUCOPHAGE) 1000 MG tablet Take 1 tablet (1,000 mg total) by mouth 2 (two) times daily with a meal. 180 tablet 3  . montelukast (SINGULAIR)  10 MG tablet Take 10 mg by mouth at bedtime.    Marland Kitchen omeprazole (PRILOSEC) 20 MG capsule Take 1 capsule (20 mg total) by mouth daily before breakfast. 30 capsule 11  . ONETOUCH DELICA LANCETS 93Z MISC Three times daily testing dx e11.9 300 each 5  . polyethylene glycol powder (GLYCOLAX/MIRALAX) powder Take 17 g by mouth daily. (Patient taking differently: Take 17 g by mouth daily as needed (for constipation.). ) 255 g 0  . sodium chloride (OCEAN) 0.65 % SOLN nasal spray Place 1 spray into both nostrils 4 (four) times daily as needed for congestion.    . triamterene-hydrochlorothiazide (MAXZIDE-25) 37.5-25 MG tablet TAKE 1 TABLET BY MOUTH ONCE DAILY 90 tablet 2  . valACYclovir (VALTREX) 500 MG tablet Take 1,000 mg by mouth daily as needed (for 3 days if needed for out breaks).      No current facility-administered medications for this visit.      Musculoskeletal: Strength & Muscle Tone: within normal limits Gait & Station: normal Patient leans: N/A  Psychiatric Specialty Exam: ROS  Blood pressure 117/84, pulse 84, height 5' 4.5" (1.638 m), weight 234 lb (106.1 kg), SpO2 98 %, unknown if currently breastfeeding.Body mass index is 39.55 kg/m.  General Appearance: Casual  Eye Contact:  Good  Speech:  Clear and Coherent  Volume:  Normal  Mood:  Anxious  Affect:  Appropriate  Thought Process:  Goal Directed  Orientation:  Full (Time, Place, and Person)  Thought Content: Logical   Suicidal Thoughts:  No  Homicidal Thoughts:  No  Memory:  Immediate;   Good Recent;   Good Remote;   Good  Judgement:  Good  Insight:  Good  Psychomotor Activity:  Normal  Concentration:  Concentration: Good and Attention Span: Good  Recall:  Good  Fund of Knowledge: Good  Language: Good  Akathisia:  No  Handed:  Right  AIMS (if indicated): not done  Assets:  Communication Skills Desire for Improvement Housing Resilience Social Support  ADL's:  Intact  Cognition: WNL  Sleep:  Fair    Screenings: PHQ2-9     Office Visit from 05/03/2018 in [facility] Office Visit from 08/17/2017 in [facility] Office Visit from 05/11/2017 in [facility] Office Visit from 10/16/2016 in [facility] Office Visit from 06/01/2016 in [facility]  PHQ-2 Total Score  0  0  0  0  0       Assessment and Plan: Bipolar disorder type I.  Generalized anxiety disorder.  Reassurance given.  Encouraged to continue therapy with [name].  In the past she had a good response with Wellbutrin and I suggested if she like to restart but patient need more time to think about it.  She is hoping therapy may help her anxiety and in that case she does not want any more medication.  Discussed healthy lifestyle and watch her calorie intake.  Patient is hoping to get a second opinion for her adrenal adenoma from [place].  Recommended to call us back if she has any question, concern if he feels worsening of the symptoms.  I will see her again in 3 months.   Kathlee Nations, MD 05/31/2018, 11:37 AM

## 2018-07-11 ENCOUNTER — Ambulatory Visit (HOSPITAL_COMMUNITY): Payer: BLUE CROSS/BLUE SHIELD | Admitting: Psychology

## 2018-07-18 DIAGNOSIS — Z6839 Body mass index (BMI) 39.0-39.9, adult: Secondary | ICD-10-CM | POA: Diagnosis not present

## 2018-07-18 DIAGNOSIS — D3501 Benign neoplasm of right adrenal gland: Secondary | ICD-10-CM | POA: Diagnosis not present

## 2018-07-18 DIAGNOSIS — D3502 Benign neoplasm of left adrenal gland: Secondary | ICD-10-CM | POA: Diagnosis not present

## 2018-07-23 DIAGNOSIS — D3502 Benign neoplasm of left adrenal gland: Secondary | ICD-10-CM | POA: Diagnosis not present

## 2018-07-26 DIAGNOSIS — D3501 Benign neoplasm of right adrenal gland: Secondary | ICD-10-CM | POA: Diagnosis not present

## 2018-07-26 DIAGNOSIS — D3502 Benign neoplasm of left adrenal gland: Secondary | ICD-10-CM | POA: Diagnosis not present

## 2018-08-01 DIAGNOSIS — Z124 Encounter for screening for malignant neoplasm of cervix: Secondary | ICD-10-CM | POA: Diagnosis not present

## 2018-08-01 DIAGNOSIS — Z01411 Encounter for gynecological examination (general) (routine) with abnormal findings: Secondary | ICD-10-CM | POA: Diagnosis not present

## 2018-08-01 DIAGNOSIS — Z1231 Encounter for screening mammogram for malignant neoplasm of breast: Secondary | ICD-10-CM | POA: Diagnosis not present

## 2018-08-15 DIAGNOSIS — R05 Cough: Secondary | ICD-10-CM | POA: Diagnosis not present

## 2018-08-15 DIAGNOSIS — J029 Acute pharyngitis, unspecified: Secondary | ICD-10-CM | POA: Diagnosis not present

## 2018-08-17 DIAGNOSIS — J069 Acute upper respiratory infection, unspecified: Secondary | ICD-10-CM | POA: Diagnosis not present

## 2018-08-24 ENCOUNTER — Ambulatory Visit: Payer: BLUE CROSS/BLUE SHIELD | Admitting: "Endocrinology

## 2018-08-25 DIAGNOSIS — Z6841 Body Mass Index (BMI) 40.0 and over, adult: Secondary | ICD-10-CM | POA: Diagnosis not present

## 2018-08-25 DIAGNOSIS — K429 Umbilical hernia without obstruction or gangrene: Secondary | ICD-10-CM | POA: Diagnosis not present

## 2018-08-26 DIAGNOSIS — D3501 Benign neoplasm of right adrenal gland: Secondary | ICD-10-CM | POA: Diagnosis not present

## 2018-08-26 DIAGNOSIS — Z6841 Body Mass Index (BMI) 40.0 and over, adult: Secondary | ICD-10-CM | POA: Diagnosis not present

## 2018-08-26 DIAGNOSIS — Z794 Long term (current) use of insulin: Secondary | ICD-10-CM | POA: Diagnosis not present

## 2018-08-26 DIAGNOSIS — I10 Essential (primary) hypertension: Secondary | ICD-10-CM | POA: Diagnosis not present

## 2018-08-26 DIAGNOSIS — E1165 Type 2 diabetes mellitus with hyperglycemia: Secondary | ICD-10-CM | POA: Diagnosis not present

## 2018-08-26 DIAGNOSIS — D3502 Benign neoplasm of left adrenal gland: Secondary | ICD-10-CM | POA: Diagnosis not present

## 2018-08-29 DIAGNOSIS — D3502 Benign neoplasm of left adrenal gland: Secondary | ICD-10-CM | POA: Diagnosis not present

## 2018-08-31 ENCOUNTER — Ambulatory Visit (HOSPITAL_COMMUNITY): Payer: BLUE CROSS/BLUE SHIELD | Admitting: Psychiatry

## 2018-09-06 DIAGNOSIS — D3502 Benign neoplasm of left adrenal gland: Secondary | ICD-10-CM | POA: Diagnosis not present

## 2018-09-09 ENCOUNTER — Other Ambulatory Visit (HOSPITAL_COMMUNITY): Payer: Self-pay | Admitting: Psychiatry

## 2018-09-09 DIAGNOSIS — F3162 Bipolar disorder, current episode mixed, moderate: Secondary | ICD-10-CM

## 2018-09-12 ENCOUNTER — Telehealth: Payer: Self-pay | Admitting: *Deleted

## 2018-09-12 DIAGNOSIS — R509 Fever, unspecified: Secondary | ICD-10-CM | POA: Diagnosis not present

## 2018-09-12 DIAGNOSIS — J0140 Acute pansinusitis, unspecified: Secondary | ICD-10-CM | POA: Diagnosis not present

## 2018-09-12 NOTE — Telephone Encounter (Signed)
Called pt she [facility] They diagnosed her with a sinus infection she was flu negative.

## 2018-09-12 NOTE — Telephone Encounter (Signed)
Work in today if possible, if not tomorrow, please

## 2018-09-12 NOTE — Telephone Encounter (Signed)
Pt called stating she had sinus infection symptoms that had been going on since Thursday. It got worse yesterday. She has had a fever coughing the mucous is dark yellow and red. Has a lot of congestion and drainage. Headaches, chills, and body aches. Wanted to know if she should be seen or something could be called in.

## 2018-09-16 ENCOUNTER — Other Ambulatory Visit (HOSPITAL_COMMUNITY): Payer: Self-pay

## 2018-09-16 DIAGNOSIS — F3162 Bipolar disorder, current episode mixed, moderate: Secondary | ICD-10-CM

## 2018-09-16 MED ORDER — ARIPIPRAZOLE 5 MG PO TABS: 5.0000 mg | ORAL_TABLET | Freq: Every evening | ORAL | 0 refills | Status: DC

## 2018-09-20 DIAGNOSIS — K3184 Gastroparesis: Secondary | ICD-10-CM | POA: Diagnosis not present

## 2018-09-20 DIAGNOSIS — D3501 Benign neoplasm of right adrenal gland: Secondary | ICD-10-CM | POA: Diagnosis not present

## 2018-09-20 DIAGNOSIS — E1165 Type 2 diabetes mellitus with hyperglycemia: Secondary | ICD-10-CM | POA: Diagnosis not present

## 2018-09-20 DIAGNOSIS — Z823 Family history of stroke: Secondary | ICD-10-CM | POA: Diagnosis not present

## 2018-09-20 DIAGNOSIS — E1143 Type 2 diabetes mellitus with diabetic autonomic (poly)neuropathy: Secondary | ICD-10-CM | POA: Diagnosis not present

## 2018-09-20 DIAGNOSIS — Z6841 Body Mass Index (BMI) 40.0 and over, adult: Secondary | ICD-10-CM | POA: Diagnosis not present

## 2018-09-20 DIAGNOSIS — I10 Essential (primary) hypertension: Secondary | ICD-10-CM | POA: Diagnosis not present

## 2018-09-20 DIAGNOSIS — E669 Obesity, unspecified: Secondary | ICD-10-CM | POA: Diagnosis not present

## 2018-09-20 DIAGNOSIS — Z79899 Other long term (current) drug therapy: Secondary | ICD-10-CM | POA: Diagnosis not present

## 2018-09-20 DIAGNOSIS — K429 Umbilical hernia without obstruction or gangrene: Secondary | ICD-10-CM | POA: Diagnosis not present

## 2018-09-20 DIAGNOSIS — Z8249 Family history of ischemic heart disease and other diseases of the circulatory system: Secondary | ICD-10-CM | POA: Diagnosis not present

## 2018-09-20 DIAGNOSIS — D3502 Benign neoplasm of left adrenal gland: Secondary | ICD-10-CM | POA: Diagnosis not present

## 2018-09-20 DIAGNOSIS — Z833 Family history of diabetes mellitus: Secondary | ICD-10-CM | POA: Diagnosis not present

## 2018-09-20 DIAGNOSIS — Z794 Long term (current) use of insulin: Secondary | ICD-10-CM | POA: Diagnosis not present

## 2018-09-20 DIAGNOSIS — Z841 Family history of disorders of kidney and ureter: Secondary | ICD-10-CM | POA: Diagnosis not present

## 2018-09-25 DIAGNOSIS — D3502 Benign neoplasm of left adrenal gland: Secondary | ICD-10-CM | POA: Diagnosis not present

## 2018-09-26 DIAGNOSIS — D3502 Benign neoplasm of left adrenal gland: Secondary | ICD-10-CM | POA: Diagnosis not present

## 2018-09-27 DIAGNOSIS — D3502 Benign neoplasm of left adrenal gland: Secondary | ICD-10-CM | POA: Diagnosis not present

## 2018-09-28 DIAGNOSIS — D3502 Benign neoplasm of left adrenal gland: Secondary | ICD-10-CM | POA: Diagnosis not present

## 2018-09-29 ENCOUNTER — Ambulatory Visit (INDEPENDENT_AMBULATORY_CARE_PROVIDER_SITE_OTHER): Payer: BLUE CROSS/BLUE SHIELD | Admitting: Psychiatry

## 2018-09-29 ENCOUNTER — Encounter (HOSPITAL_COMMUNITY): Payer: Self-pay | Admitting: Psychiatry

## 2018-09-29 VITALS — BP 127/81 | HR 103 | Ht 64.5 in | Wt 246.0 lb

## 2018-09-29 DIAGNOSIS — F3162 Bipolar disorder, current episode mixed, moderate: Secondary | ICD-10-CM | POA: Diagnosis not present

## 2018-09-29 DIAGNOSIS — F411 Generalized anxiety disorder: Secondary | ICD-10-CM | POA: Diagnosis not present

## 2018-09-29 MED ORDER — ARIPIPRAZOLE 5 MG PO TABS: 5.0000 mg | ORAL_TABLET | Freq: Every evening | ORAL | 0 refills | Status: DC

## 2018-09-29 NOTE — Progress Notes (Signed)
[facility] MD/PA/NP OP Progress Note  09/29/2018 8:46 AM Christina [name]  MRN:  [id number]  Chief Complaint: I am feeling okay.  My anxiety is not as bad.  I am seeing endocrinologist at [facility].  HPI: Christina [name] aim for her appointment.  She got second opinion for her adrenal adenoma and now she is following up with endocrinologist at [facility].  Patient told she had adrenal adenoma on both side and there is a possibility she may have a surgery in the future.  Overall she feels her anxiety is not as bad and she is able to function.  She feels proud that she got invitation for honor society for counselor at [facility].  She is sleeping good.  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  She is pleased that she had a good support from her husband and in-laws.  Some nights she has difficulty sleeping but she denies any paranoia, hallucination, highs and lows or any mood swings.  She denies any crying spells or any feeling of hopelessness or any suicidal thoughts.  She like to continue Abilify at present dose.  She has no tremors shakes or any EPS.    Visit Diagnosis:    ICD-10-CM   1. GAD (generalized anxiety disorder) F41.1 ARIPiprazole (ABILIFY) 5 MG tablet  2. Bipolar 1 disorder, mixed, moderate (HCC) F31.62 ARIPiprazole (ABILIFY) 5 MG tablet    DISCONTINUED: ARIPiprazole (ABILIFY) 5 MG tablet    Past Psychiatric History: Reviewed. H/O depression, anxiety, mania and psychosis.  Admitted in 1997 at [facility] due to suicidal gestures.  Seen in this office since 2006. Tried Prozac and Wellbutrin.   Past Medical History:  Past Medical History:  Diagnosis Date  . Anemia   . Bipolar disorder ([place])   . Depression   . Diabetes mellitus    type 2 DM, insulin only needed during pregnancy  . Gastroparesis   . 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 GERD (gastroesophageal reflux disease)   . H/O acute sinusitis 10/2016  . Hypertension   .  Thyroid enlargement   . Wears glasses     Past Surgical History:  Procedure Laterality Date  . BREAST REDUCTION SURGERY  1994  . CESAREAN SECTION N/A 03/06/2016   Procedure: CESAREAN SECTION;  Surgeon: Ena Dawley, MD;  Location: [place];  Service: Obstetrics;  Laterality: N/A;  . COLONOSCOPY WITH PROPOFOL N/A 10/26/2017   Dr. Oneida Alar: Torturous transverse and sigmoid colon, internal hemorrhoids.  Next colonoscopy in 10 years with MAC and color wrap  . dermoid tumor  2000  . DILITATION & CURRETTAGE/HYSTROSCOPY WITH NOVASURE ABLATION N/A 12/10/2016   Procedure: DILATATION & CURETTAGE/HYSTEROSCOPY WITH NOVASURE ABLATION;  Surgeon: Ena Dawley, MD;  Location: [facility];  Service: Gynecology;  Laterality: N/A;  . ESOPHAGOGASTRODUODENOSCOPY  07/01/2009   [facility]:WCBJSE esphagus without barrett's/dilation with 16 mm/mild erthyema in the antrum. mild chronic gastritis on path.   . ESOPHAGOGASTRODUODENOSCOPY (EGD) WITH PROPOFOL N/A 10/26/2017   Dr. Oneida Alar: Gastritis, gastric polyps.  Biopsies benign.  Small bowel biopsies negative for celiac.  No H. pylori.  Marland Kitchen TRIGGER FINGER RELEASE Bilateral 06/2015  . TUBAL LIGATION Bilateral 03/06/2016   Procedure: BILATERAL TUBAL LIGATION;  Surgeon: Ena Dawley, MD;  Location: [place];  Service: Obstetrics;  Laterality: Bilateral;    Family Psychiatric History: Reviewed.  Family History:  Family History  Problem Relation Age of Onset  .  Diabetes Mother   . Hypertension Mother   . Fibromyalgia Mother   . Stroke Mother   . Diabetes Father   . Hypertension Father   . Asthma Father   . Heart disease Father   . Depression Father   . Colon cancer Neg Hx     Social History:  Social History   Socioeconomic History  . Marital status: Married    Spouse name: Not on file  . Number of children: Not on file  . Years of education: Not on file  . Highest education level: Not on file  Occupational History  . Not on  file  Social Needs  . Financial resource strain: Not on file  . Food insecurity:    Worry: Not on file    Inability: Not on file  . Transportation needs:    Medical: Not on file    Non-medical: Not on file  Tobacco Use  . Smoking status: Never Smoker  . Smokeless tobacco: Never Used  . Tobacco comment: Never smoked   Substance and Sexual Activity  . Alcohol use: Yes    Alcohol/week: 0.0 standard drinks    Comment: occ  . Drug use: No  . Sexual activity: Yes    Partners: Male    Birth control/protection: Surgical  Lifestyle  . Physical activity:    Days per week: Not on file    Minutes per session: Not on file  . Stress: Not on file  Relationships  . Social connections:    Talks on phone: Not on file    Gets together: Not on file    Attends religious service: Not on file    Active member of club or organization: Not on file    Attends meetings of clubs or organizations: Not on file    Relationship status: Not on file  Other Topics Concern  . Not on file  Social History Narrative  . Not on file    Allergies:  Allergies  Allergen Reactions  . Other Anaphylaxis    Tree nuts  . Peanut Oil Anaphylaxis  . Peanut-Containing Drug Products Anaphylaxis  . Augmentin [Amoxicillin-Pot Clavulanate] Nausea And Vomiting    Causes stomach cramps. Patient can take amoxicillin, not Augmentin. Has patient had a PCN reaction causing immediate rash, facial/tongue/throat swelling, SOB or lightheadedness with hypotension: No Has patient had a PCN reaction causing severe rash involving mucus membranes or skin necrosis: No Has patient had a PCN reaction that required hospitalization: No Has patient had a PCN reaction occurring within the last 10 years: No If all of the above answers are "NO", then may proceed with Cephalospori    Metabolic Disorder Labs: Lab Results  Component Value Date   HGBA1C 8.9 (H) 04/27/2018   MPG 154 06/12/2016   MPG 137 03/16/2016   No results found for:  PROLACTIN Lab Results  Component Value Date   CHOL 176 10/04/2017   TRIG 120 10/04/2017   HDL 64 10/04/2017   CHOLHDL 2.8 10/04/2017   VLDL 26 03/16/2016   LDLCALC 88 10/04/2017   LDLCALC 48 07/07/2017   Lab Results  Component Value Date   TSH 0.927 05/03/2018   TSH 0.822 03/20/2017    Therapeutic Level Labs: No results found for: LITHIUM No results found for: VALPROATE No components found for:  CBMZ  Current Medications: Current Outpatient Medications  Medication Sig Dispense Refill  . acetaminophen (TYLENOL) 500 MG tablet Take 1,000 mg every 8 (eight) hours as needed by mouth for mild pain.     Marland Kitchen  ARIPiprazole (ABILIFY) 5 MG tablet Take 1 tablet (5 mg total) by mouth every evening. 30 tablet 0  . B-D UF III MINI PEN NEEDLES 31G X 5 MM MISC USE AS DIRECTED 100 each 2  . fluticasone (FLONASE) 50 MCG/ACT nasal spray Place 2 sprays into both nostrils daily. 16 g 6  . glucose blood test strip Use as instructed, three times daily 300 each 12  . ibuprofen (ADVIL,MOTRIN) 200 MG tablet Take 600 mg by mouth every 8 (eight) hours as needed (FOR PAIN.).    . Insulin Glargine (LANTUS) 100 UNIT/ML Solostar Pen Inject 50 Units into the skin at bedtime. 15 mL 11  . loratadine (CLARITIN) 10 MG tablet Take 1 tablet (10 mg total) by mouth daily. Taking generic otc allergy (Patient taking differently: Take 10 mg by mouth every evening. Taking generic otc allergy) 90 tablet 1  . losartan (COZAAR) 25 MG tablet Take 1 tablet (25 mg total) by mouth daily. 90 tablet 3  . metFORMIN (GLUCOPHAGE) 1000 MG tablet Take 1 tablet (1,000 mg total) by mouth 2 (two) times daily with a meal. 180 tablet 3  . montelukast (SINGULAIR) 10 MG tablet Take 10 mg by mouth at bedtime.    Marland Kitchen omeprazole (PRILOSEC) 20 MG capsule Take 1 capsule (20 mg total) by mouth daily before breakfast. 30 capsule 11  . ONETOUCH DELICA LANCETS 16X MISC Three times daily testing dx e11.9 300 each 5  . polyethylene glycol powder  (GLYCOLAX/MIRALAX) powder Take 17 g by mouth daily. (Patient taking differently: Take 17 g by mouth daily as needed (for constipation.). ) 255 g 0  . sodium chloride (OCEAN) 0.65 % SOLN nasal spray Place 1 spray into both nostrils 4 (four) times daily as needed for congestion.    . triamterene-hydrochlorothiazide (MAXZIDE-25) 37.5-25 MG tablet TAKE 1 TABLET BY MOUTH ONCE DAILY 90 tablet 2  . valACYclovir (VALTREX) 500 MG tablet Take 1,000 mg by mouth daily as needed (for 3 days if needed for out breaks).      No current facility-administered medications for this visit.      Musculoskeletal: Strength & Muscle Tone: within normal limits Gait & Station: normal Patient leans: N/A  Psychiatric Specialty Exam: ROS  Blood pressure 127/81, pulse (!) 103, height 5' 4.5" (1.638 m), weight 246 lb (111.6 kg), unknown if currently breastfeeding.Body mass index is 41.57 kg/m.  General Appearance: Casual  Eye Contact:  Good  Speech:  Clear and Coherent  Volume:  Normal  Mood:  Anxious  Affect:  Congruent  Thought Process:  Goal Directed  Orientation:  Full (Time, Place, and Person)  Thought Content: Logical   Suicidal Thoughts:  No  Homicidal Thoughts:  No  Memory:  Immediate;   Good Recent;   Good Remote;   Good  Judgement:  Good  Insight:  Good  Psychomotor Activity:  Normal  Concentration:  Concentration: Good and Attention Span: Good  Recall:  Good  Fund of Knowledge: Good  Language: Good  Akathisia:  No  Handed:  Right  AIMS (if indicated): not done  Assets:  Communication Skills Desire for Improvement Resilience Social Support  ADL's:  Intact  Cognition: WNL  Sleep:  Fair   Screenings: PHQ2-9     Office Visit from 05/03/2018 in [facility] Office Visit from 08/17/2017 in [facility] Office Visit from 05/11/2017 in [facility] Office Visit from 10/16/2016 in [facility] Office Visit from 06/01/2016 in [facility]  PHQ-2 Total Score  0  0  0  0  0       Assessment and Plan: Bipolar disorder type I.  Generalized anxiety disorder.  Patient is a stable on Abilify 5 mg.  She does not feel that she should try a different medication since it is working well.  She admitted not able to keep appointment with [name] but like to reschedule appointment.  Discussed medication side effects and benefits.  Recommended to call us back if she has any question or any concern.  Follow-up in 3 months.   Kathlee Nations, MD 09/29/2018, 8:46 AM

## 2018-10-03 ENCOUNTER — Other Ambulatory Visit: Payer: Self-pay | Admitting: Family Medicine

## 2018-10-07 DIAGNOSIS — D3502 Benign neoplasm of left adrenal gland: Secondary | ICD-10-CM | POA: Diagnosis not present

## 2018-10-07 DIAGNOSIS — K429 Umbilical hernia without obstruction or gangrene: Secondary | ICD-10-CM | POA: Diagnosis not present

## 2018-10-10 ENCOUNTER — Ambulatory Visit (INDEPENDENT_AMBULATORY_CARE_PROVIDER_SITE_OTHER): Payer: BLUE CROSS/BLUE SHIELD | Admitting: Psychology

## 2018-10-10 DIAGNOSIS — F3162 Bipolar disorder, current episode mixed, moderate: Secondary | ICD-10-CM

## 2018-10-10 DIAGNOSIS — F411 Generalized anxiety disorder: Secondary | ICD-10-CM | POA: Diagnosis not present

## 2018-10-10 NOTE — Progress Notes (Signed)
   THERAPIST PROGRESS NOTE  Session Time: 10.01am-10.59am  Participation Level: Active  Behavioral Response: Well GroomedAlertaffect wnl  Type of Therapy: Individual Therapy  Treatment Goals addressed: Diagnosis: Bipolar 1 , GAD and goal 1.  Interventions: CBT and Supportive  Summary: Christina [name] is a 43 y.o. female who presents with affect wnl.  Pt reported that she has returned to counseling and feels that available to continue w/ appointments at this time.  Pt reported on new stressors since last session- pt has been dx w/ adrenal adenomas and just learned this morning that she will have surgery to remove one.  Pt reports that she does have some anxiety about as doesn't know about what that will entail yet.  Pt reported she has remained busy w/ grad school and work and family life but managing. Pt is being mindful of not taking on too much and discussed idea for starting a mentoring program but aware that this may too much at this time.  Pt also reported grieving w/ learning death of ex that had dated 6 years during college years.  Pt reports that has been faced w/ the reality of death and unexpected events.  Pt reported that although she presents that she has things all together- she doesn't.  Pt disclosed that her husband is alcoholic but in denial- not wanting to admit and so feels that she hides that from the world but impacting her relationship and day to day.  Pt acknowledged that can't ignore impact having.   Suicidal/Homicidal: Nowithout intent/plan  Therapist Response: Assessed pt current functioning per pt report.  Discussed pt return to counseling.  Explored w/pt current stressors and discussed supports.  Processed w/pt relationship w/ husband and importance of recognizing and expressing emotions about.  Explored importance of self care and not avoiding that having an impact.  Plan: Return again in 2 weeks.  Diagnosis: Bipolar 1 d/o and GAD  Viney Acocella, LPC 10/10/2018

## 2018-10-18 DIAGNOSIS — K429 Umbilical hernia without obstruction or gangrene: Secondary | ICD-10-CM | POA: Diagnosis not present

## 2018-10-18 DIAGNOSIS — I10 Essential (primary) hypertension: Secondary | ICD-10-CM | POA: Diagnosis not present

## 2018-10-18 DIAGNOSIS — Z794 Long term (current) use of insulin: Secondary | ICD-10-CM | POA: Diagnosis not present

## 2018-10-18 DIAGNOSIS — K3184 Gastroparesis: Secondary | ICD-10-CM | POA: Diagnosis not present

## 2018-10-18 DIAGNOSIS — E669 Obesity, unspecified: Secondary | ICD-10-CM | POA: Diagnosis not present

## 2018-10-18 DIAGNOSIS — D3502 Benign neoplasm of left adrenal gland: Secondary | ICD-10-CM | POA: Diagnosis not present

## 2018-10-18 DIAGNOSIS — D3501 Benign neoplasm of right adrenal gland: Secondary | ICD-10-CM | POA: Diagnosis not present

## 2018-10-18 DIAGNOSIS — Z880 Allergy status to penicillin: Secondary | ICD-10-CM | POA: Diagnosis not present

## 2018-10-18 DIAGNOSIS — F319 Bipolar disorder, unspecified: Secondary | ICD-10-CM | POA: Diagnosis not present

## 2018-10-18 DIAGNOSIS — Z833 Family history of diabetes mellitus: Secondary | ICD-10-CM | POA: Diagnosis not present

## 2018-10-18 DIAGNOSIS — Z6841 Body Mass Index (BMI) 40.0 and over, adult: Secondary | ICD-10-CM | POA: Diagnosis not present

## 2018-10-18 DIAGNOSIS — D4412 Neoplasm of uncertain behavior of left adrenal gland: Secondary | ICD-10-CM | POA: Diagnosis not present

## 2018-10-18 DIAGNOSIS — E1143 Type 2 diabetes mellitus with diabetic autonomic (poly)neuropathy: Secondary | ICD-10-CM | POA: Diagnosis not present

## 2018-10-18 DIAGNOSIS — Z79899 Other long term (current) drug therapy: Secondary | ICD-10-CM | POA: Diagnosis not present

## 2018-11-18 ENCOUNTER — Other Ambulatory Visit: Payer: Self-pay

## 2018-11-18 ENCOUNTER — Ambulatory Visit (INDEPENDENT_AMBULATORY_CARE_PROVIDER_SITE_OTHER): Payer: BLUE CROSS/BLUE SHIELD | Admitting: Psychology

## 2018-11-18 DIAGNOSIS — F3162 Bipolar disorder, current episode mixed, moderate: Secondary | ICD-10-CM | POA: Diagnosis not present

## 2018-11-18 NOTE — Progress Notes (Signed)
Virtual Visit via Video Note  I connected with Christina [name] on 11/18/18 at 10:00 AM EDT by a video enabled telemedicine application and verified that I am speaking with the correct person using two identifiers.   I discussed the limitations of evaluation and management by telemedicine and the availability of in person appointments. The patient expressed understanding and agreed to proceed.   I provided 48 minutes of non-face-to-face time during this encounter.   [name] [facility]    THERAPIST PROGRESS NOTE  Session Time: 10am-10.48am  Participation Level: Active  Behavioral Response: Well GroomedAlertaffect wnl  Type of Therapy: Individual Therapy  Treatment Goals addressed: Diagnosis: Bipolar 1 d/o  Interventions: CBT and Supportive  Summary: Christina [name] is a 43 y.o. female who presents with affect wnl.  pt reported that has been stressful w/ transition of social distancing- working from home, helping w/ kids distance learning, husband turning to his unhealty coping of drinking.  Pt reported that she was able to transition well w/ school as already distance learning and only one week of classes left.  Pt reported yesterday was particulary stressful- had talked w/ husband about his drinking the night before, son was struggling w/ schooling and his ADHD, she received summer tuition bill and realized not enough money set aside.  Pt discussed how she has been stressed and wanting husband to not deny or minimize drinking problem and seek help  Pt has been able to express thoughts and feelings to him.  Pt is worried that when she needs him for support he migt not be able to be there for her.  Pt awareness of needs to have multiple supports and utilize her coping skills and continued communication w/ husband..   Suicidal/Homicidal: Nowithout intent/plan  Therapist Response: Assessed pt current functioning per pt report. Processed w/ pt coping w/ transition w/ quarantine and impact for  family.  Validated and normalized and encouraged use of coping skills.  Processed w/pt impact of husbands drinking on her and ways of communicating and expressing needs as well as seeking supports.   Plan: Return again in 2 weeks, via webex.   discussed the assessment and treatment plan with the patient. The patient was provided an opportunity to ask questions and all were answered. The patient agreed with the plan and demonstrated an understanding of the instructions.   The patient was advised to call back or seek an in-person evaluation if the symptoms worsen or if the condition fails to improve as anticipated.  Diagnosis: Bipolar 1 d/o  [name] [facility] 11/18/2018

## 2018-11-23 ENCOUNTER — Other Ambulatory Visit: Payer: Self-pay

## 2018-11-23 ENCOUNTER — Ambulatory Visit (INDEPENDENT_AMBULATORY_CARE_PROVIDER_SITE_OTHER): Payer: BLUE CROSS/BLUE SHIELD | Admitting: Family Medicine

## 2018-11-23 ENCOUNTER — Encounter: Payer: Self-pay | Admitting: Family Medicine

## 2018-11-23 VITALS — BP 120/80 | Ht 64.0 in | Wt 240.0 lb

## 2018-11-23 DIAGNOSIS — E1165 Type 2 diabetes mellitus with hyperglycemia: Secondary | ICD-10-CM

## 2018-11-23 DIAGNOSIS — I10 Essential (primary) hypertension: Secondary | ICD-10-CM

## 2018-11-23 DIAGNOSIS — E1169 Type 2 diabetes mellitus with other specified complication: Secondary | ICD-10-CM

## 2018-11-23 DIAGNOSIS — R51 Headache: Secondary | ICD-10-CM | POA: Diagnosis not present

## 2018-11-23 DIAGNOSIS — E669 Obesity, unspecified: Secondary | ICD-10-CM

## 2018-11-23 DIAGNOSIS — R509 Fever, unspecified: Secondary | ICD-10-CM | POA: Diagnosis not present

## 2018-11-23 DIAGNOSIS — R197 Diarrhea, unspecified: Secondary | ICD-10-CM | POA: Diagnosis not present

## 2018-11-23 DIAGNOSIS — R519 Headache, unspecified: Secondary | ICD-10-CM

## 2018-11-23 NOTE — Progress Notes (Signed)
Virtual Visit via Telephone Note  I connected with Christina [name] on 11/23/18 at  3:20 PM EDT by telephone and verified that I am speaking with the correct person using two identifiers.   I discussed the limitations, risks, security and privacy concerns of performing an evaluation and management service by telephone and the availability of in person appointments. I also discussed with the patient that there may be a patient responsible charge related to this service. The patient expressed understanding and agreed to proceed. Patient is in her home and I am at the office, web ex appointment successful   History of Present Illness:   acute diarrheah, nausea and vomit  X 3 days, fever up to102 , today 101.1 with ibuprofen , spouse also affected, thinks that her symptoms may have been triggered by food eaten from a restaurant the day before as a take out. The 3 children are asymptomatic 1 loose stool only on early Mon morning, no blood pr mucus, nausea still mildly present, 1 episode of emesis Reports improvement in her blood sugar control, now being treated through [facility]. Does acknowledge that her blood sugar varies with diet, also that she is not as committed to exercise as she had been and knows that she needs to be  Denies polyuria, polydipsia, blurred vision , or hypoglycemic episodes. 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  HENT: Negative for congestion and sinus pain.   Respiratory: Negative for cough and sputum production.   Cardiovascular: Negative for chest pain, palpitations and leg swelling.  Gastrointestinal: Positive for diarrhea.  Genitourinary: Negative for dysuria.  Neurological: Positive for headaches. Negative for sensory change, speech change and  seizures.  Endo/Heme/Allergies: Negative for polydipsia.  Psychiatric/Behavioral: Negative for depression. The patient is not nervous/anxious.        Treated by Psychiatry      Observations/Objective: BP 120/80   Ht 5\' 4"  (1.626 m)   Wt 240 lb (108.9 kg)   BMI 41.20 kg/m    Assessment and Plan: Fever Reports acute fever associiated with GI symptoms and malaise. Advised CBC to futher evaluate, no other symptoms to suggest bacterial infection at this time Advised  Strict hygiene practice to limit spread of any potential infection, she is already doing this  Morbid obesity  Patient re-educated about  the importance of commitment to a  minimum of 150 minutes of exercise per week as able.  The importance of healthy food choices with portion control discussed, as well as eating regularly and within a 12 hour window most days. The need to choose "clean , green" food 50 to 75% of the time is discussed, as well as to make water the primary drink and set a goal of 64 ounces water daily.    Weight /BMI 11/23/2018 05/24/2018 05/03/2018  WEIGHT 240 lb 234 lb 235 lb  HEIGHT 5\' 4"  5' 4.5" 5' 4.5"  BMI 41.2 kg/m2 39.55 kg/m2 39.71 kg/m2  Some encounter information is confidential and restricted. Go to Review Flowsheets activity to see all data.      Uncontrolled type 2 diabetes mellitus with hyperglycemia ([place]) Christina [name] is reminded of the importance of commitment to daily physical activity for 30 minutes or more, as able and the need to limit carbohydrate intake to 30  to 60 grams per meal to help with blood sugar control.   The need to take medication as prescribed, test blood sugar as directed, and to call between visits if there is a concern that blood sugar is uncontrolled is also discussed.   Christina [name] is reminded of the importance of daily foot exam, annual eye examination, and good blood sugar, blood pressure and cholesterol control. Deteriorated, managed by Endo at [facility], has apppt  this week  Diabetic Labs Latest Ref Rng & Units 11/25/2018 04/27/2018 03/11/2018 01/25/2018 10/04/2017  HbA1c 4.8 - 5.6 % 9.6(H) 8.9(H) - 8.5(H) 8.3(H)  Microalbumin Not Estab. ug/mL - - - - -  Micro/Creat Ratio 0.0 - 30.0 mg/g creat - - - - -  Chol 100 - 199 mg/dL - - - - 176  HDL >39 mg/dL - - - - 64  Calc LDL 0 - 99 mg/dL - - - - 88  Triglycerides 0 - 149 mg/dL - - - - 120  Creatinine 0.57 - 1.00 mg/dL 0.83 0.90 0.80 0.81 0.86   BP/Weight 11/23/2018 05/24/2018 05/03/2018 02/25/2018 01/27/2018 10/26/2017 0/04/2329  Systolic BP 076 226 [phone] 937  Diastolic BP 80 81 79 78 82 86 85  Wt. (Lbs) 240 234 235 239.6 242 - 239  BMI 41.2 39.55 39.71 40.49 40.9 - 40.39  Some encounter information is confidential and restricted. Go to Review Flowsheets activity to see all data.   Foot/eye exam completion dates Latest Ref Rng & Units 12/29/2017 07/06/2017  Eye Exam No Retinopathy No Retinopathy -  Foot Form Completion - - Done        Acute diarrhea One episode 3 days prior which spontaneously resolved, however reports ongoing fever , chills and headache will obtain baseline lab data  Headache No new neurologic symptoms reported by patient, advised OTC tylenol for as needed use    Follow Up Instructions:    I discussed the assessment and treatment plan with the patient. The patient was provided an opportunity to ask questions and all were answered. The patient agreed with the plan and demonstrated an understanding of the instructions.   The patient was advised to call back or seek an in-person evaluation if the symptoms worsen or if the condition fails to improve as anticipated.  I provided 72minutes of non-face-to-face time during this encounter.   Tula Nakayama, MD

## 2018-11-23 NOTE — Patient Instructions (Addendum)
F/U in 6 month, call if you need me before  You  will need labs if you continue to have fever in the next 2 days, we will send an order to the lab for CBC and diff cmp and EGFr , [facility] , so you can go there directly if your fever persists, and send me a my chart message Lab at [place]  No antibiotics are indicated currently, your symptoms are mainly viral   For your headache use ibuprofen 400 mg alternating with tylenol 325 mg   All the best with your surgery, thankful that your blood sugar and allergies are doing better, keep it up  It is important that you exercise regularly at least 30 minutes 5 times a week. If you develop chest pain, have severe difficulty breathing, or feel very tired, stop exercising immediately and seek medical attention    Think about what you will eat, plan ahead. Choose " clean, green, fresh or frozen" over canned, processed or packaged foods which are more sugary, salty and fatty. 70 to 75% of food eaten should be vegetables and fruit. Three meals at set times with snacks allowed between meals, but they must be fruit or vegetables. Aim to eat over a 12 hour period , example 7 am to 7 pm, and STOP after  your last meal of the day. Drink water,generally about 64 ounces per day, no other drink is as healthy. Fruit juice is best enjoyed in a healthy way, by EATING the fruit.

## 2018-11-25 DIAGNOSIS — D3502 Benign neoplasm of left adrenal gland: Secondary | ICD-10-CM | POA: Diagnosis not present

## 2018-11-25 DIAGNOSIS — D3501 Benign neoplasm of right adrenal gland: Secondary | ICD-10-CM | POA: Diagnosis not present

## 2018-11-25 DIAGNOSIS — E669 Obesity, unspecified: Secondary | ICD-10-CM | POA: Diagnosis not present

## 2018-11-25 DIAGNOSIS — E1169 Type 2 diabetes mellitus with other specified complication: Secondary | ICD-10-CM | POA: Diagnosis not present

## 2018-11-25 DIAGNOSIS — Z794 Long term (current) use of insulin: Secondary | ICD-10-CM | POA: Diagnosis not present

## 2018-11-25 DIAGNOSIS — E1165 Type 2 diabetes mellitus with hyperglycemia: Secondary | ICD-10-CM | POA: Diagnosis not present

## 2018-11-26 ENCOUNTER — Encounter: Payer: Self-pay | Admitting: Family Medicine

## 2018-11-26 LAB — CBC WITH DIFFERENTIAL/PLATELET
Basophils Absolute: 0 10*3/uL (ref 0.0–0.2)
Basos: 1 %
EOS (ABSOLUTE): 0.1 10*3/uL (ref 0.0–0.4)
Eos: 1 %
Hematocrit: 37.7 % (ref 34.0–46.6)
Hemoglobin: 12.6 g/dL (ref 11.1–15.9)
Immature Grans (Abs): 0 10*3/uL (ref 0.0–0.1)
Immature Granulocytes: 0 %
Lymphocytes Absolute: 2.7 10*3/uL (ref 0.7–3.1)
Lymphs: 49 %
MCH: 30.2 pg (ref 26.6–33.0)
MCHC: 33.4 g/dL (ref 31.5–35.7)
MCV: 90 fL (ref 79–97)
Monocytes Absolute: 0.3 10*3/uL (ref 0.1–0.9)
Monocytes: 6 %
Neutrophils Absolute: 2.4 10*3/uL (ref 1.4–7.0)
Neutrophils: 43 %
Platelets: 360 10*3/uL (ref 150–450)
RBC: 4.17 x10E6/uL (ref 3.77–5.28)
RDW: 12.9 % (ref 11.7–15.4)
WBC: 5.6 10*3/uL (ref 3.4–10.8)

## 2018-11-26 LAB — CMP14+EGFR
ALT: 12 IU/L (ref 0–32)
AST: 11 IU/L (ref 0–40)
Albumin/Globulin Ratio: 2 (ref 1.2–2.2)
Albumin: 4.8 g/dL (ref 3.8–4.8)
Alkaline Phosphatase: 64 IU/L (ref 39–117)
BUN/Creatinine Ratio: 12 (ref 9–23)
BUN: 10 mg/dL (ref 6–24)
Bilirubin Total: 0.2 mg/dL (ref 0.0–1.2)
CO2: 22 mmol/L (ref 20–29)
Calcium: 9.8 mg/dL (ref 8.7–10.2)
Chloride: 100 mmol/L (ref 96–106)
Creatinine, Ser: 0.83 mg/dL (ref 0.57–1.00)
GFR calc Af Amer: 101 mL/min/{1.73_m2} (ref >59)
GFR calc non Af Amer: 87 mL/min/{1.73_m2} (ref >59)
Globulin, Total: 2.4 g/dL (ref 1.5–4.5)
Glucose: 250 mg/dL — ABNORMAL HIGH (ref 65–99)
Potassium: 4.1 mmol/L (ref 3.5–5.2)
Sodium: 140 mmol/L (ref 134–144)
Total Protein: 7.2 g/dL (ref 6.0–8.5)

## 2018-11-26 LAB — HEMOGLOBIN A1C
Est. average glucose Bld gHb Est-mCnc: 229 mg/dL
Hgb A1c MFr Bld: 9.6 % — ABNORMAL HIGH (ref 4.8–5.6)

## 2018-11-27 ENCOUNTER — Encounter: Payer: Self-pay | Admitting: Family Medicine

## 2018-11-27 DIAGNOSIS — R509 Fever, unspecified: Secondary | ICD-10-CM | POA: Insufficient documentation

## 2018-11-27 DIAGNOSIS — R51 Headache: Secondary | ICD-10-CM

## 2018-11-27 DIAGNOSIS — R197 Diarrhea, unspecified: Secondary | ICD-10-CM | POA: Insufficient documentation

## 2018-11-27 DIAGNOSIS — R519 Headache, unspecified: Secondary | ICD-10-CM | POA: Insufficient documentation

## 2018-11-27 NOTE — Assessment & Plan Note (Signed)
One episode 3 days prior which spontaneously resolved, however reports ongoing fever , chills and headache will obtain baseline lab data

## 2018-11-27 NOTE — Assessment & Plan Note (Signed)
Christina [name] is reminded of the importance of commitment to daily physical activity for 30 minutes or more, as able and the need to limit carbohydrate intake to 30 to 60 grams per meal to help with blood sugar control.   The need to take medication as prescribed, test blood sugar as directed, and to call between visits if there is a concern that blood sugar is uncontrolled is also discussed.   Christina [name] is reminded of the importance of daily foot exam, annual eye examination, and good blood sugar, blood pressure and cholesterol control. Deteriorated, managed by Endo at [facility], has apppt this week  Diabetic Labs Latest Ref Rng & Units 11/25/2018 04/27/2018 03/11/2018 01/25/2018 10/04/2017  HbA1c 4.8 - 5.6 % 9.6(H) 8.9(H) - 8.5(H) 8.3(H)  Microalbumin Not Estab. ug/mL - - - - -  Micro/Creat Ratio 0.0 - 30.0 mg/g creat - - - - -  Chol 100 - 199 mg/dL - - - - 176  HDL >39 mg/dL - - - - 64  Calc LDL 0 - 99 mg/dL - - - - 88  Triglycerides 0 - 149 mg/dL - - - - 120  Creatinine 0.57 - 1.00 mg/dL 0.83 0.90 0.80 0.81 0.86   BP/Weight 11/23/2018 05/24/2018 05/03/2018 02/25/2018 01/27/2018 10/26/2017 12/28/7822  Systolic BP 235 361 443 154 008 676 195  Diastolic BP 80 81 79 78 82 86 85  Wt. (Lbs) 240 234 235 239.6 242 - 239  BMI 41.2 39.55 39.71 40.49 40.9 - 40.39  Some encounter information is confidential and restricted. Go to Review Flowsheets activity to see all data.   Foot/eye exam completion dates Latest Ref Rng & Units 12/29/2017 07/06/2017  Eye Exam No Retinopathy No Retinopathy -  Foot Form Completion - - Done

## 2018-11-27 NOTE — Assessment & Plan Note (Signed)
  Patient re-educated about  the importance of commitment to a  minimum of 150 minutes of exercise per week as able.  The importance of healthy food choices with portion control discussed, as well as eating regularly and within a 12 hour window most days. The need to choose "clean , green" food 50 to 75% of the time is discussed, as well as to make water the primary drink and set a goal of 64 ounces water daily.    Weight /BMI 11/23/2018 05/24/2018 05/03/2018  WEIGHT 240 lb 234 lb 235 lb  HEIGHT 5\' 4"  5' 4.5" 5' 4.5"  BMI 41.2 kg/m2 39.55 kg/m2 39.71 kg/m2  Some encounter information is confidential and restricted. Go to Review Flowsheets activity to see all data.

## 2018-11-27 NOTE — Assessment & Plan Note (Signed)
Reports acute fever associiated with GI symptoms and malaise. Advised CBC to futher evaluate, no other symptoms to suggest bacterial infection at this time Advised  Strict hygiene practice to limit spread of any potential infection, she is already doing this

## 2018-11-27 NOTE — Assessment & Plan Note (Signed)
No new neurologic symptoms reported by patient, advised OTC tylenol for as needed use

## 2018-11-29 ENCOUNTER — Ambulatory Visit (INDEPENDENT_AMBULATORY_CARE_PROVIDER_SITE_OTHER): Payer: BLUE CROSS/BLUE SHIELD | Admitting: Psychology

## 2018-11-29 ENCOUNTER — Other Ambulatory Visit: Payer: Self-pay

## 2018-11-29 DIAGNOSIS — F3162 Bipolar disorder, current episode mixed, moderate: Secondary | ICD-10-CM

## 2018-11-29 NOTE — Progress Notes (Signed)
REVIEWED-NO ADDITIONAL RECOMMENDATIONS. 

## 2018-11-29 NOTE — Progress Notes (Signed)
Virtual Visit via Telephone Note  I connected with Christina [name] on 11/29/18 at 10:00 AM EDT by telephone (pt video portion not working today) and verified that I am speaking with the correct person using two identifiers.   I discussed the limitations, risks, security and privacy concerns of performing an evaluation and management service by telephone and the availability of in person appointments. I also discussed with the patient that there may be a patient responsible charge related to this service. The patient expressed understanding and agreed to proceed.  I provided 51 minutes of non-face-to-face time during this encounter.   Christina [name] [facility]    THERAPIST PROGRESS NOTE  Session Time: 10am-10.51am  Participation Level: Active  Behavioral Response: n/a for apprearnce Alertaffect wnl  Type of Therapy: Individual Therapy  Treatment Goals addressed: Diagnosis: Bipolar 1 d/o and goal 1.  Interventions: CBT  Summary: Christina [name] is a 43 y.o. female who presents with affect wnl.  pt reports she has been stressed this past week w/ knowing husband's brother's bday would be a trigger and w/ not having a way to pay for summer classes.  Pt reported that she didn't sleep well the past 2 nights w/ lots of obsessing of not being able to take summer classes.  Pt reports only option would be to ask parents for help but pt wants to do on her own.  Pt reported that also grappling w/ idea of being a perfectionist and asking her parent and close friend who immediately identified as.  Pt identifies how struggle to accept as negative and also because doesn't feel that her life is in order enough to be one.  Pt recognizing that this is more about struggle to deal w/ high expectations places on self and coping when doesn't go as expected.  Pt discussed concern that she is hypomanic w/ 2 days of lost sleep and stress of not doing summer class. Pt discussed need to focus on acceptance and grounding and  ways of doing. Suicidal/Homicidal: Nowithout intent/plan  Therapist Response: Assessed pt current functioning per pt report. Processed w/ pt coping w/ stressors over past week and hearing from others that they view her as perfectionist.  Explored w/pt her options for summer and focus on acceptance and making best out of changed plans.  Discussed use of mantra or positive statement of acceptance and use of grounding skills to assist w/ change in sleep and mood.  Plan: Return again in 2 weeks, via webex.  I discussed the assessment and treatment plan with the patient. The patient was provided an opportunity to ask questions and all were answered. The patient agreed with the plan and demonstrated an understanding of the instructions.   The patient was advised to call back or seek an in-person evaluation if the symptoms worsen or if the condition fails to improve as anticipated.  Diagnosis: Bipolar d/o    Christina [name] [facility] 11/29/2018

## 2018-12-02 HISTORY — PX: HERNIA REPAIR: SHX51

## 2018-12-08 DIAGNOSIS — Z01818 Encounter for other preprocedural examination: Secondary | ICD-10-CM | POA: Diagnosis not present

## 2018-12-08 DIAGNOSIS — E1143 Type 2 diabetes mellitus with diabetic autonomic (poly)neuropathy: Secondary | ICD-10-CM | POA: Diagnosis not present

## 2018-12-08 DIAGNOSIS — I10 Essential (primary) hypertension: Secondary | ICD-10-CM | POA: Diagnosis not present

## 2018-12-10 DIAGNOSIS — Z20828 Contact with and (suspected) exposure to other viral communicable diseases: Secondary | ICD-10-CM | POA: Diagnosis not present

## 2018-12-10 DIAGNOSIS — Z1159 Encounter for screening for other viral diseases: Secondary | ICD-10-CM | POA: Diagnosis not present

## 2018-12-12 DIAGNOSIS — E669 Obesity, unspecified: Secondary | ICD-10-CM | POA: Diagnosis not present

## 2018-12-12 DIAGNOSIS — K429 Umbilical hernia without obstruction or gangrene: Secondary | ICD-10-CM | POA: Diagnosis not present

## 2018-12-12 DIAGNOSIS — K219 Gastro-esophageal reflux disease without esophagitis: Secondary | ICD-10-CM | POA: Diagnosis not present

## 2018-12-12 DIAGNOSIS — E1143 Type 2 diabetes mellitus with diabetic autonomic (poly)neuropathy: Secondary | ICD-10-CM | POA: Diagnosis not present

## 2018-12-12 DIAGNOSIS — Z9851 Tubal ligation status: Secondary | ICD-10-CM | POA: Diagnosis not present

## 2018-12-12 DIAGNOSIS — Z794 Long term (current) use of insulin: Secondary | ICD-10-CM | POA: Diagnosis not present

## 2018-12-12 DIAGNOSIS — E279 Disorder of adrenal gland, unspecified: Secondary | ICD-10-CM | POA: Diagnosis not present

## 2018-12-12 DIAGNOSIS — F319 Bipolar disorder, unspecified: Secondary | ICD-10-CM | POA: Diagnosis not present

## 2018-12-12 DIAGNOSIS — D3501 Benign neoplasm of right adrenal gland: Secondary | ICD-10-CM | POA: Diagnosis not present

## 2018-12-12 DIAGNOSIS — K3184 Gastroparesis: Secondary | ICD-10-CM | POA: Diagnosis not present

## 2018-12-12 DIAGNOSIS — F329 Major depressive disorder, single episode, unspecified: Secondary | ICD-10-CM | POA: Diagnosis not present

## 2018-12-12 DIAGNOSIS — Z9889 Other specified postprocedural states: Secondary | ICD-10-CM | POA: Diagnosis not present

## 2018-12-12 DIAGNOSIS — Z6841 Body Mass Index (BMI) 40.0 and over, adult: Secondary | ICD-10-CM | POA: Diagnosis not present

## 2018-12-12 DIAGNOSIS — I10 Essential (primary) hypertension: Secondary | ICD-10-CM | POA: Diagnosis not present

## 2018-12-12 DIAGNOSIS — D509 Iron deficiency anemia, unspecified: Secondary | ICD-10-CM | POA: Diagnosis not present

## 2018-12-12 DIAGNOSIS — D3502 Benign neoplasm of left adrenal gland: Secondary | ICD-10-CM | POA: Diagnosis not present

## 2018-12-13 DIAGNOSIS — D3502 Benign neoplasm of left adrenal gland: Secondary | ICD-10-CM | POA: Diagnosis not present

## 2018-12-14 ENCOUNTER — Ambulatory Visit (HOSPITAL_COMMUNITY): Payer: BLUE CROSS/BLUE SHIELD | Admitting: Psychology

## 2018-12-14 MED ORDER — INSULIN LISPRO 100 UNIT/ML ~~LOC~~ SOLN
0.00 | SUBCUTANEOUS | Status: DC
Start: 2018-12-14 — End: 2018-12-14

## 2018-12-14 MED ORDER — STRI-DEX MAXIMUM STRENGTH 2 % EX PADS
25.00 | MEDICATED_PAD | CUTANEOUS | Status: DC
Start: ? — End: 2018-12-14

## 2018-12-14 MED ORDER — GRANDPAS INDIAN CORN SOAP EX
500.00 | CUTANEOUS | Status: DC
Start: 2018-12-15 — End: 2018-12-14

## 2018-12-14 MED ORDER — INSULIN GLARGINE 100 UNIT/ML ~~LOC~~ SOLN
30.00 | SUBCUTANEOUS | Status: DC
Start: 2018-12-14 — End: 2018-12-14

## 2018-12-14 MED ORDER — SURE COMFORT INSULIN SYRINGE 30G X 1/2" 0.5 ML MISC
25.00 | Status: DC
Start: 2018-12-15 — End: 2018-12-14

## 2018-12-14 MED ORDER — Medication
Status: DC
Start: ? — End: 2018-12-14

## 2018-12-14 MED ORDER — CELLULOSE SODIUM PHOSPHATE VI
5000.00 | Status: DC
Start: 2018-12-14 — End: 2018-12-14

## 2018-12-14 MED ORDER — Medication
1.00 | Status: DC
Start: ? — End: 2018-12-14

## 2018-12-14 MED ORDER — TRIAMTERENE-HCTZ 37.5-25 MG PO TABS
1.00 | ORAL_TABLET | ORAL | Status: DC
Start: 2018-12-15 — End: 2018-12-14

## 2018-12-14 MED ORDER — LIDOCAINE 5 % EX PTCH
2.00 | MEDICATED_PATCH | CUTANEOUS | Status: DC
Start: 2018-12-15 — End: 2018-12-14

## 2018-12-14 MED ORDER — DESMOPRESSIN ACE SPRAY REFRIG
20.00 | Status: DC
Start: 2018-12-15 — End: 2018-12-14

## 2018-12-14 MED ORDER — LORATADINE 10 MG PO TABS
10.00 | ORAL_TABLET | ORAL | Status: DC
Start: 2018-12-15 — End: 2018-12-14

## 2018-12-14 MED ORDER — ACETAMINOPHEN 500 MG PO TABS
1000.00 | ORAL_TABLET | ORAL | Status: DC
Start: 2018-12-14 — End: 2018-12-14

## 2018-12-14 MED ORDER — X-5 THERAPEUTIC EX
2.00 | CUTANEOUS | Status: DC
Start: 2018-12-15 — End: 2018-12-14

## 2018-12-14 MED ORDER — GABAPENTIN 300 MG PO CAPS
300.00 | ORAL_CAPSULE | ORAL | Status: DC
Start: 2018-12-14 — End: 2018-12-14

## 2018-12-14 MED ORDER — ONDANSETRON 4 MG PO TBDP
4.00 | ORAL_TABLET | ORAL | Status: DC
Start: ? — End: 2018-12-14

## 2018-12-14 MED ORDER — MONTELUKAST SODIUM 10 MG PO TABS
10.00 | ORAL_TABLET | ORAL | Status: DC
Start: 2018-12-14 — End: 2018-12-14

## 2018-12-14 MED ORDER — Medication
5.00 | Status: DC
Start: 2018-12-14 — End: 2018-12-14

## 2018-12-15 ENCOUNTER — Encounter: Payer: Self-pay | Admitting: Family Medicine

## 2018-12-27 ENCOUNTER — Other Ambulatory Visit: Payer: Self-pay

## 2018-12-27 ENCOUNTER — Ambulatory Visit (INDEPENDENT_AMBULATORY_CARE_PROVIDER_SITE_OTHER): Payer: BLUE CROSS/BLUE SHIELD | Admitting: Psychology

## 2018-12-27 DIAGNOSIS — F3162 Bipolar disorder, current episode mixed, moderate: Secondary | ICD-10-CM | POA: Diagnosis not present

## 2018-12-27 NOTE — Progress Notes (Signed)
   THERAPIST PROGRESS NOTE  Session Time: 10am-10.40am  Participation Level: Active  Behavioral Response: n/a over the phoneAlertaffect wnl  Type of Therapy: Individual Therapy  Treatment Goals addressed: Diagnosis: Bipolar 1 d/o and goal 1.  Interventions: CBT and Supportive  Summary: Christina [name] is a 43 y.o. female who presents with affect wnl.  Pt reported she is 2 weeks post op and didn't realize going into surgery that would be this difficult of recovery.  Pt reported that she is no longer w/ prescription pain meds and still dealing w/ a lot of pain.  Pt thought it was a 2 week recovery and would be back to work- but doctor has out till June 15.  Pt reported feeling in some ways that needs to go back as [employer] and to attend to things.  Pt is aware that she wouldn't be able to attend to things the ways she could and therefore needs to listen to her body and focus on her recovery from surgery.  Pt reported that she has become also more aware of problem for husband as anticipated that he would be able to attend to things during her recovery but drinking is getting in the way starting in the evening.  Pt reported she has researched marriage counseling and feels that this would be the next step.  Pt reports that she can't continue to ignore.     Suicidal/Homicidal: Nowithout intent/plan  Therapist Response: Assessed pt current functioning per pt report. Processed w/pt her recovery from surgery and reiterating need to focus on her self care and utilize resources- FMLA etc. To do.  Explored w/pt her increased awareness of impact husband/s drinking is having and what steps she wants to take to address.  Plan: Return again in 2 weeks.  Diagnosis: Bipolar 1 d/o   [name] [facility] 12/27/2018

## 2018-12-30 ENCOUNTER — Ambulatory Visit (HOSPITAL_COMMUNITY): Payer: BLUE CROSS/BLUE SHIELD | Admitting: Psychiatry

## 2019-01-02 ENCOUNTER — Ambulatory Visit (HOSPITAL_COMMUNITY): Payer: BLUE CROSS/BLUE SHIELD | Admitting: Psychiatry

## 2019-01-03 ENCOUNTER — Telehealth (HOSPITAL_COMMUNITY): Payer: Self-pay | Admitting: Psychiatry

## 2019-01-03 ENCOUNTER — Ambulatory Visit (HOSPITAL_COMMUNITY): Payer: BLUE CROSS/BLUE SHIELD | Admitting: Psychiatry

## 2019-01-03 DIAGNOSIS — T8149XA Infection following a procedure, other surgical site, initial encounter: Secondary | ICD-10-CM | POA: Diagnosis not present

## 2019-01-04 ENCOUNTER — Ambulatory Visit (INDEPENDENT_AMBULATORY_CARE_PROVIDER_SITE_OTHER): Payer: BC Managed Care – PPO | Admitting: Psychiatry

## 2019-01-04 ENCOUNTER — Encounter (HOSPITAL_COMMUNITY): Payer: Self-pay | Admitting: Psychiatry

## 2019-01-04 ENCOUNTER — Other Ambulatory Visit: Payer: Self-pay

## 2019-01-04 DIAGNOSIS — F411 Generalized anxiety disorder: Secondary | ICD-10-CM

## 2019-01-04 DIAGNOSIS — F3162 Bipolar disorder, current episode mixed, moderate: Secondary | ICD-10-CM

## 2019-01-04 MED ORDER — ARIPIPRAZOLE 5 MG PO TABS: 5.0000 mg | ORAL_TABLET | Freq: Every evening | ORAL | 0 refills | Status: DC

## 2019-01-04 NOTE — Progress Notes (Signed)
Virtual Visit via Telephone Note  I connected with Christina [name] on 01/04/19 at 10:20 AM EDT by telephone and verified that I am speaking with the correct person using two identifiers.   I discussed the limitations, risks, security and privacy concerns of performing an evaluation and management service by telephone and the availability of in person appointments. I also discussed with the patient that there may be a patient responsible charge related to this service. The patient expressed understanding and agreed to proceed.   History of Present Illness: Patient was evaluated through phone session.  She recently had surgery for her adrenal gland at [facility].  She is recovering from her.  She still in a lot of pain and takes controlled substance but slowly and gradually improving.  She also getting therapy from [name].  She is taking Abilify which is helping her anxiety and mood swings.  She denies any recent paranoia, hallucination, highs or lows or any irritability.  She is taking time off from the work.  She had a good support from her husband and in-laws.  She reported no side effects of the medication.  She is hoping now she had a surgery hopefully her blood sugar go down.  She denies drinking or using any illegal substances.  She reported no complications of the surgery and Abilify help her anxiety and mood.  Her energy level is fair.  Her appetite is okay.  Past Psychiatric History: Reviewed. H/O depression, anxiety, mania and psychosis.  Admitted in [facility] at [facility] due to suicidal gestures.  Seen in this office since 2006. Tried Prozac and Wellbutrin.   Recent Results (from the past 2160 hour(s))  CBC with Differential/Platelet     Status: None   Collection Time: 11/25/18 11:00 AM  Result Value Ref Range   WBC 5.6 3.4 - 10.8 x10E3/uL   RBC 4.17 3.77 - 5.28 x10E6/uL   Hemoglobin 12.6 11.1 - 15.9 g/dL   Hematocrit 37.7 34.0 - 46.6 %   MCV 90 79 - 97 fL   MCH 30.2 26.6 - 33.0 pg   MCHC 33.4  31.5 - 35.7 g/dL   RDW 12.9 11.7 - 15.4 %   Platelets 360 150 - 450 x10E3/uL   Neutrophils 43 Not Estab. %   Lymphs 49 Not Estab. %   Monocytes 6 Not Estab. %   Eos 1 Not Estab. %   Basos 1 Not Estab. %   Neutrophils Absolute 2.4 1.4 - 7.0 x10E3/uL   Lymphocytes Absolute 2.7 0.7 - 3.1 x10E3/uL   Monocytes Absolute 0.3 0.1 - 0.9 x10E3/uL   EOS (ABSOLUTE) 0.1 0.0 - 0.4 x10E3/uL   Basophils Absolute 0.0 0.0 - 0.2 x10E3/uL   Immature Granulocytes 0 Not Estab. %   Immature Grans (Abs) 0.0 0.0 - 0.1 x10E3/uL  CMP14+EGFR     Status: Abnormal   Collection Time: 11/25/18 11:00 AM  Result Value Ref Range   Glucose 250 (H) 65 - 99 mg/dL   BUN 10 6 - 24 mg/dL   Creatinine, Ser 0.83 0.57 - 1.00 mg/dL   GFR calc non Af Amer 87 >59 mL/min/1.73   GFR calc Af Amer 101 >59 mL/min/1.73   BUN/Creatinine Ratio 12 9 - 23   Sodium 140 134 - 144 mmol/L   Potassium 4.1 3.5 - 5.2 mmol/L   Chloride 100 96 - 106 mmol/L   CO2 22 20 - 29 mmol/L   Calcium 9.8 8.7 - 10.2 mg/dL   Total Protein 7.2 6.0 - 8.5  g/dL   Albumin 4.8 3.8 - 4.8 g/dL   Globulin, Total 2.4 1.5 - 4.5 g/dL   Albumin/Globulin Ratio 2.0 1.2 - 2.2   Bilirubin Total 0.2 0.0 - 1.2 mg/dL   Alkaline Phosphatase 64 39 - 117 IU/L   AST 11 0 - 40 IU/L   ALT 12 0 - 32 IU/L  Hemoglobin A1c     Status: Abnormal   Collection Time: 11/25/18 11:00 AM  Result Value Ref Range   Hgb A1c MFr Bld 9.6 (H) 4.8 - 5.6 %    Comment:          Prediabetes: 5.7 - 6.4          Diabetes: >6.4          Glycemic control for adults with diabetes: <7.0    Est. average glucose Bld gHb Est-mCnc 229 mg/dL     Psychiatric Specialty Exam: Physical Exam  ROS  unknown if currently breastfeeding.There is no height or weight on file to calculate BMI.  General Appearance: NA  Eye Contact:  NA  Speech:  Clear and Coherent  Volume:  Normal  Mood:  Anxious  Affect:  NA  Thought Process:  Goal Directed  Orientation:  Full (Time, Place, and Person)  Thought Content:   Logical  Suicidal Thoughts:  No  Homicidal Thoughts:  No  Memory:  Immediate;   Good Recent;   Good Remote;   Good  Judgement:  Good  Insight:  Good  Psychomotor Activity:  NA  Concentration:  Concentration: Good and Attention Span: Good  Recall:  Good  Fund of Knowledge:  Good  Language:  Good  Akathisia:  NA  Handed:  Right  AIMS (if indicated):     Assets:  Communication Skills Desire for Improvement Housing Resilience Social Support Talents/Skills  ADL's:  Intact  Cognition:  WNL  Sleep:   fair      Assessment and Plan: Bipolar disorder type I.  Generalized anxiety disorder.  Patient recently had surgery for her adrenal glands.  She is recovering from it.  She feels Abilify is working and she is stable.  Does not want to change or add anything.  I encouraged to continue therapy with [name].  Continue Abilify 5 mg daily.  She does not have any side effects.  Recommended to call us back if she has any question or any concern.  Discussed medication side effects and benefits.  Follow-up in 3 months.  Follow Up Instructions:    I discussed the assessment and treatment plan with the patient. The patient was provided an opportunity to ask questions and all were answered. The patient agreed with the plan and demonstrated an understanding of the instructions.   The patient was advised to call back or seek an in-person evaluation if the symptoms worsen or if the condition fails to improve as anticipated.  I provided 20 minutes of non-face-to-face time during this encounter.   Kathlee Nations, MD

## 2019-01-09 ENCOUNTER — Ambulatory Visit: Payer: BLUE CROSS/BLUE SHIELD | Admitting: Family Medicine

## 2019-01-10 ENCOUNTER — Other Ambulatory Visit: Payer: Self-pay

## 2019-01-10 ENCOUNTER — Ambulatory Visit (INDEPENDENT_AMBULATORY_CARE_PROVIDER_SITE_OTHER): Payer: BC Managed Care – PPO | Admitting: Psychology

## 2019-01-10 DIAGNOSIS — F3162 Bipolar disorder, current episode mixed, moderate: Secondary | ICD-10-CM

## 2019-01-10 DIAGNOSIS — F411 Generalized anxiety disorder: Secondary | ICD-10-CM | POA: Diagnosis not present

## 2019-01-10 NOTE — Progress Notes (Signed)
Virtual Visit via Telephone Note  I connected with Christina [name] on 01/10/19 at  9:00 AM EDT by telephone as video not working, and verified that I am speaking with the correct person using two identifiers.   I discussed the limitations, risks, security and privacy concerns of performing an evaluation and management service by telephone and the availability of in person appointments. I also discussed with the patient that there may be a patient responsible charge related to this service. The patient expressed understanding and agreed to proceed.  I discussed the assessment and treatment plan with the patient. The patient was provided an opportunity to ask questions and all were answered. The patient agreed with the plan and demonstrated an understanding of the instructions.   The patient was advised to call back or seek an in-person evaluation if the symptoms worsen or if the condition fails to improve as anticipated.  I provided 52 minutes of non-face-to-face time during this encounter.   [name] [facility]    THERAPIST PROGRESS NOTE  Session Time: 9am-9.52am  Participation Level: Active  Behavioral Response: NAAlertaffect wnl  Type of Therapy: Individual Therapy  Treatment Goals addressed: Diagnosis: bipolar 1 d/o and goal 1.  Interventions: CBT and Supportive  Summary: Christina [name] is a 43 y.o. female who presents with affect wnl. Pt reported that she developed an infection at her surgery site and after a week of antibiotics and still running a fever- they feel that she has an internal infection and need to do a scan to determine next steps.  Pt reported that this has kept her from returning to work and she has cancelled 2nd summer session as realized will not be ready for both work and school this summer.  Pt expressed that has a lot of anxiety as concerned that w/out the momentum of continuing may not return and may just be another "whim".  Pt is able to acknowledge that this is  different from past and reframe but still concerns.  Pt discussed how becoming more aware of perfection and for things to look perfect from the outside. Pt is being mindful of trying to "sit w/ things not being just right' and accepting this- although hard.  Pt rpeorted that she and husband have started communication other day w/ addressing concerns and although 'didn't get far- was a start and intentional about continuing.   Suicidal/Homicidal: Nowithout intent/plan  Therapist Response: Assessed pt current functioning per pt report. Processed w/pt coping w/ added stressors- acknowledging things that are out of her control and worries.  Discussed w/ pt things that are in her control and how to respond w/ statements of acceptance.  Explored w/ pt her thoughts of perfection and how impacting and ways she is working to reframe.  Plan: Return again in 2 weeks, via webex  Diagnosis: Bipolar 1 d/o   [name], [facility] 01/10/2019

## 2019-01-13 DIAGNOSIS — R109 Unspecified abdominal pain: Secondary | ICD-10-CM | POA: Diagnosis not present

## 2019-01-13 DIAGNOSIS — T8149XA Infection following a procedure, other surgical site, initial encounter: Secondary | ICD-10-CM | POA: Diagnosis not present

## 2019-01-13 DIAGNOSIS — K429 Umbilical hernia without obstruction or gangrene: Secondary | ICD-10-CM | POA: Diagnosis not present

## 2019-01-13 DIAGNOSIS — R509 Fever, unspecified: Secondary | ICD-10-CM | POA: Diagnosis not present

## 2019-01-13 DIAGNOSIS — R933 Abnormal findings on diagnostic imaging of other parts of digestive tract: Secondary | ICD-10-CM | POA: Diagnosis not present

## 2019-01-14 DIAGNOSIS — Z6841 Body Mass Index (BMI) 40.0 and over, adult: Secondary | ICD-10-CM | POA: Diagnosis not present

## 2019-01-14 DIAGNOSIS — E1143 Type 2 diabetes mellitus with diabetic autonomic (poly)neuropathy: Secondary | ICD-10-CM | POA: Diagnosis not present

## 2019-01-14 DIAGNOSIS — E669 Obesity, unspecified: Secondary | ICD-10-CM | POA: Diagnosis not present

## 2019-01-14 DIAGNOSIS — I10 Essential (primary) hypertension: Secondary | ICD-10-CM | POA: Diagnosis not present

## 2019-01-14 DIAGNOSIS — R188 Other ascites: Secondary | ICD-10-CM | POA: Diagnosis not present

## 2019-01-14 DIAGNOSIS — Z79899 Other long term (current) drug therapy: Secondary | ICD-10-CM | POA: Diagnosis not present

## 2019-01-14 DIAGNOSIS — L02211 Cutaneous abscess of abdominal wall: Secondary | ICD-10-CM | POA: Diagnosis not present

## 2019-01-14 DIAGNOSIS — K651 Peritoneal abscess: Secondary | ICD-10-CM | POA: Diagnosis not present

## 2019-01-14 DIAGNOSIS — F319 Bipolar disorder, unspecified: Secondary | ICD-10-CM | POA: Diagnosis not present

## 2019-01-14 DIAGNOSIS — R509 Fever, unspecified: Secondary | ICD-10-CM | POA: Diagnosis not present

## 2019-01-14 DIAGNOSIS — T8189XA Other complications of procedures, not elsewhere classified, initial encounter: Secondary | ICD-10-CM | POA: Diagnosis not present

## 2019-01-14 DIAGNOSIS — R000 Tachycardia, unspecified: Secondary | ICD-10-CM | POA: Diagnosis not present

## 2019-01-14 DIAGNOSIS — Z20828 Contact with and (suspected) exposure to other viral communicable diseases: Secondary | ICD-10-CM | POA: Diagnosis not present

## 2019-01-14 DIAGNOSIS — T8143XA Infection following a procedure, organ and space surgical site, initial encounter: Secondary | ICD-10-CM | POA: Diagnosis not present

## 2019-01-14 DIAGNOSIS — Z794 Long term (current) use of insulin: Secondary | ICD-10-CM | POA: Diagnosis not present

## 2019-01-14 DIAGNOSIS — K3184 Gastroparesis: Secondary | ICD-10-CM | POA: Diagnosis not present

## 2019-01-15 DIAGNOSIS — T8143XA Infection following a procedure, organ and space surgical site, initial encounter: Secondary | ICD-10-CM | POA: Diagnosis not present

## 2019-01-16 DIAGNOSIS — R188 Other ascites: Secondary | ICD-10-CM | POA: Diagnosis not present

## 2019-01-16 DIAGNOSIS — T8143XA Infection following a procedure, organ and space surgical site, initial encounter: Secondary | ICD-10-CM | POA: Diagnosis not present

## 2019-01-17 DIAGNOSIS — T8143XA Infection following a procedure, organ and space surgical site, initial encounter: Secondary | ICD-10-CM | POA: Diagnosis not present

## 2019-01-17 MED ORDER — COMPOUND W FREEZE OFF EX AERO
0.00 | INHALATION_SPRAY | CUTANEOUS | Status: DC
Start: 2019-01-17 — End: 2019-01-17

## 2019-01-17 MED ORDER — DEXTROSE 50 % IV SOLN
12.50 | INTRAVENOUS | Status: DC
Start: ? — End: 2019-01-17

## 2019-01-17 MED ORDER — GENERIC EXTERNAL MEDICATION
Status: DC
Start: ? — End: 2019-01-17

## 2019-01-17 MED ORDER — QUINERVA 260 MG PO TABS
650.00 | ORAL_TABLET | ORAL | Status: DC
Start: ? — End: 2019-01-17

## 2019-01-17 MED ORDER — MONTELUKAST SODIUM 10 MG PO TABS
10.00 | ORAL_TABLET | ORAL | Status: DC
Start: 2019-01-17 — End: 2019-01-17

## 2019-01-17 MED ORDER — CELLULOSE SODIUM PHOSPHATE VI
5000.00 | Status: DC
Start: 2019-01-17 — End: 2019-01-17

## 2019-01-17 MED ORDER — ONDANSETRON 4 MG PO TBDP
4.00 | ORAL_TABLET | ORAL | Status: DC
Start: ? — End: 2019-01-17

## 2019-01-17 MED ORDER — SURE COMFORT INSULIN SYRINGE 30G X 1/2" 0.5 ML MISC
25.00 | Status: DC
Start: 2019-01-18 — End: 2019-01-17

## 2019-01-17 MED ORDER — INTROL PO
5.00 | ORAL | Status: DC
Start: 2019-01-18 — End: 2019-01-17

## 2019-01-18 ENCOUNTER — Encounter: Payer: Self-pay | Admitting: Family Medicine

## 2019-01-19 ENCOUNTER — Encounter: Payer: Self-pay | Admitting: Gastroenterology

## 2019-01-24 ENCOUNTER — Ambulatory Visit (INDEPENDENT_AMBULATORY_CARE_PROVIDER_SITE_OTHER): Payer: BC Managed Care – PPO | Admitting: Psychology

## 2019-01-24 ENCOUNTER — Other Ambulatory Visit: Payer: Self-pay

## 2019-01-24 DIAGNOSIS — F411 Generalized anxiety disorder: Secondary | ICD-10-CM | POA: Diagnosis not present

## 2019-01-24 DIAGNOSIS — F3162 Bipolar disorder, current episode mixed, moderate: Secondary | ICD-10-CM | POA: Diagnosis not present

## 2019-01-24 NOTE — Progress Notes (Signed)
  Virtual Visit via Telephone Note  I connected with Christina [name] on 01/24/19 at  9:00 AM EDT by telephone and verified that I am speaking with the correct person using two identifiers.   I discussed the limitations, risks, security and privacy concerns of performing an evaluation and management service by telephone and the availability of in person appointments. I also discussed with the patient that there may be a patient responsible charge related to this service. The patient expressed understanding and agreed to proceed    I discussed the assessment and treatment plan with the patient. The patient was provided an opportunity to ask questions and all were answered. The patient agreed with the plan and demonstrated an understanding of the instructions.   The patient was advised to call back or seek an in-person evaluation if the symptoms worsen or if the condition fails to improve as anticipated.  I provided 46 minutes of non-face-to-face time during this encounter.   [name] [facility]   THERAPIST PROGRESS NOTE  Session Time: 9am-9.46am  Participation Level: Active  Behavioral Response: n/a on phoneAlertaffect wnl  Type of Therapy: Individual Therapy  Treatment Goals addressed: Diagnosis: bipolar 1 d/o  Interventions: CBT  Summary: Christina [name] is a 43 y.o. female who presents with affect wnl.  pt reported that a lot w/ physical health since last visit- she was admitted to hospital due to infection that wasn't able to be drained outpt and had to be aspirated at the hospital.  Pt reported d/c last week and was on round of antibiotics. Pt reports she still isn't feeling better- pained return and running low grade fever- pt aware that needs to f/u and concerned that may be readmitted.  Pt reported she returned to work yesterday and was able to get some things accomplished. Pt reported felt good about grants received and will be starting to set those up.  Pt discussed awareness of  need to not overwhelm and has decided to not do second summer session.  Pt feels good about decision and not putting self down that has to take break.  Pt discussed stressor of husband drinking and that cant continue to live w/ that. Pt reports still having conversations about.  Pt is committing to her self care and not ignoring or minimizing things.  Pt agrees to f/u w/ her PCP for needs and concerns.  Suicidal/Homicidal: Nowithout intent/plan  Therapist Response: Assessed pt current functioning per pt report.  Reflected pt strengths of decision making and acceptance of her needs at this time and not taking to much on.  Explored w/pt her self care and importance of this and attending to this not ignoring.  Discussed w/ pt continued communication re: her concerns.  Plan: Return again in 2 weeks, via web  Diagnosis: Bipolar 1 d/o and GAD [name], [facility] 01/24/2019

## 2019-01-31 ENCOUNTER — Other Ambulatory Visit: Payer: Self-pay

## 2019-01-31 ENCOUNTER — Ambulatory Visit (INDEPENDENT_AMBULATORY_CARE_PROVIDER_SITE_OTHER): Payer: BC Managed Care – PPO | Admitting: Family Medicine

## 2019-01-31 ENCOUNTER — Encounter: Payer: Self-pay | Admitting: Family Medicine

## 2019-01-31 VITALS — BP 134/74 | HR 111 | Resp 15 | Ht 64.0 in | Wt 237.0 lb

## 2019-01-31 DIAGNOSIS — E1169 Type 2 diabetes mellitus with other specified complication: Secondary | ICD-10-CM

## 2019-01-31 DIAGNOSIS — Z09 Encounter for follow-up examination after completed treatment for conditions other than malignant neoplasm: Secondary | ICD-10-CM

## 2019-01-31 DIAGNOSIS — I10 Essential (primary) hypertension: Secondary | ICD-10-CM

## 2019-01-31 DIAGNOSIS — F3162 Bipolar disorder, current episode mixed, moderate: Secondary | ICD-10-CM

## 2019-01-31 DIAGNOSIS — E669 Obesity, unspecified: Secondary | ICD-10-CM

## 2019-01-31 DIAGNOSIS — E1165 Type 2 diabetes mellitus with hyperglycemia: Secondary | ICD-10-CM

## 2019-01-31 DIAGNOSIS — J309 Allergic rhinitis, unspecified: Secondary | ICD-10-CM

## 2019-01-31 MED ORDER — TRIAMTERENE-HCTZ 37.5-25 MG PO TABS: 1.0000 | ORAL_TABLET | Freq: Every day | ORAL | 3 refills | Status: DC

## 2019-01-31 MED ORDER — LOSARTAN POTASSIUM 25 MG PO TABS: 25.0000 mg | ORAL_TABLET | Freq: Every day | ORAL | 3 refills | Status: DC

## 2019-01-31 MED ORDER — FLUTICASONE PROPIONATE 50 MCG/ACT NA SUSP: 2.0000 | Freq: Every day | NASAL | 5 refills | Status: DC

## 2019-01-31 MED ORDER — MONTELUKAST SODIUM 10 MG PO TABS: 10.0000 mg | ORAL_TABLET | Freq: Every day | ORAL | 3 refills | Status: DC

## 2019-01-31 NOTE — Progress Notes (Signed)
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 Denies chest congestion, productive cough or wheezing. Denies chest pains, palpitations and leg swelling Denies  vomiting,diarrhea or constipation.   Denies dysuria, frequency, hesitancy or incontinence. Denies joint pain, swelling and limitation in mobility. Denies headaches, seizures, numbness, or tingling. Denies uncontrolled  depression, anxiety or insomnia. Denies skin break down or rash.   PE  BP 134/74   Pulse (!) 111   Resp 15   Ht 5\' 4"  (1.626 m)   Wt 237 lb (107.5 kg)   SpO2 98%   BMI 40.68 kg/m   Patient alert and oriented and in no cardiopulmonary distress.  HEENT: No facial asymmetry, EOMI,   oropharynx pink and moist.  Neck supple no JVD, no mass.  Chest: Clear to auscultation bilaterally.  CVS: S1, S2 no murmurs, no S3.Regular rate.  Abd: non distended.   Ext: No edema  MS: Adequate ROM spine, shoulders, hips and knees.  Skin: Intact, no ulcerations or rash noted.  Psych: Good eye contact, normal affect. Memory intact not anxious or depressed appearing.  CNS: CN 2-12 intact, power,  normal throughout.no focal deficits noted.   [facility] discharge follow-up Patient in for follow up of recent hospitalization. Discharge summary, and laboratory and radiology data are reviewed, and any questions or concerns about recent hospitalization are discussed. Specific issues requiring follow  up are specifically addressed.   Morbid obesity  Patient re-educated about  the importance of commitment to a  minimum of 150 minutes of exercise per week as able.  The importance of healthy food choices with portion control discussed, as well as eating regularly and within a 12 hour window most days. The need to choose "clean , green" food 50 to 75% of the time is discussed, as well as to make water the primary drink and set a goal of 64 ounces water daily.    Weight /BMI 01/31/2019 11/23/2018 05/24/2018  WEIGHT 237 lb 240 lb 234 lb  HEIGHT 5\' 4"  5\' 4"  5' 4.5"  BMI 40.68 kg/m2 41.2 kg/m2 39.55 kg/m2  Some encounter information is confidential and restricted. Go to Review Flowsheets activity to see all data.      Essential hypertension, benign Controlled, no change in medication DASH diet and commitment to daily physical activity for a minimum of 30 minutes discussed and encouraged, as a part of hypertension management. The importance of attaining a healthy weight is also discussed.  BP/Weight 01/31/2019 11/23/2018 05/24/2018 05/03/2018 02/25/2018 01/27/2018 9/77/4142  Systolic BP 395 320 233 435 686 168 372  Diastolic BP 74 80 81 79 78 82 86  Wt. (Lbs) 237 240 234 235 239.6 242 -  BMI 40.68 41.2 39.55 39.71 40.49 40.9 -  Some encounter information is confidential and restricted. Go to Review Flowsheets activity to see all data.       Uncontrolled type 2 diabetes mellitus with hyperglycemia ([place]) Christina [name] is reminded  of the importance of commitment to daily physical activity for 30 minutes or more, as able and the need to limit carbohydrate intake to 30 to 60 grams per meal to help with blood sugar control.   The need to take medication as prescribed, test blood sugar as directed, and to call between visits if there is a concern that blood sugar is uncontrolled is also discussed.   Christina [name] is reminded of the importance of daily foot exam, annual eye examination, and good blood  sugar, blood pressure and cholesterol control.  Diabetic Labs Latest Ref Rng & Units 02/01/2019 11/25/2018 04/27/2018 03/11/2018 01/25/2018  HbA1c 4.8 - 5.6 % - 9.6(H) 8.9(H) - 8.5(H)  Microalbumin Not Estab. ug/mL - - - - -  Micro/Creat Ratio 0.0 - 30.0 mg/g creat - - - - -  Chol 100 - 199 mg/dL - - - - -  HDL >39 mg/dL - - - - -  Calc LDL 0 - 99 mg/dL - - - - -  Triglycerides 0 - 149 mg/dL - - - - -  Creatinine 0.57 - 1.00 mg/dL 0.85 0.83 0.90 0.80 0.81   BP/Weight 01/31/2019 11/23/2018 05/24/2018 05/03/2018 02/25/2018 01/27/2018 5/80/9983  Systolic BP 382 505 397 673 419 379 024  Diastolic BP 74 80 81 79 78 82 86  Wt. (Lbs) 237 240 234 235 239.6 242 -  BMI 40.68 41.2 39.55 39.71 40.49 40.9 -  Some encounter information is confidential and restricted. Go to Review Flowsheets activity to see all data.   Foot/eye exam completion dates Latest Ref Rng & Units 12/29/2017 07/06/2017  Eye Exam No Retinopathy No Retinopathy -  Foot Form Completion - - Done   Deteriorated, managed by Endo at [facility]     Bipolar 1 disorder, mixed, moderate ([place]) Managed by Psych for over 15 years and controlled

## 2019-01-31 NOTE — Patient Instructions (Addendum)
F/U in 4 months, call if you need me before  Please get at [facility]  CBC, cmp and EGFr and Magnesium  Please get as soon as possible will evaluate muscle spasms  Thankful you are feeling better after your  Surgery, call if problems   ensue  Thanks for choosing [facility], we consider it a privelige to serve you.

## 2019-02-01 DIAGNOSIS — E1169 Type 2 diabetes mellitus with other specified complication: Secondary | ICD-10-CM | POA: Diagnosis not present

## 2019-02-01 DIAGNOSIS — E669 Obesity, unspecified: Secondary | ICD-10-CM | POA: Diagnosis not present

## 2019-02-01 DIAGNOSIS — I10 Essential (primary) hypertension: Secondary | ICD-10-CM | POA: Diagnosis not present

## 2019-02-02 ENCOUNTER — Encounter: Payer: Self-pay | Admitting: Family Medicine

## 2019-02-02 LAB — CBC
Hematocrit: 36.6 % (ref 34.0–46.6)
Hemoglobin: 12.2 g/dL (ref 11.1–15.9)
MCH: 30 pg (ref 26.6–33.0)
MCHC: 33.3 g/dL (ref 31.5–35.7)
MCV: 90 fL (ref 79–97)
Platelets: 438 10*3/uL (ref 150–450)
RBC: 4.06 x10E6/uL (ref 3.77–5.28)
RDW: 14.3 % (ref 11.7–15.4)
WBC: 5.6 10*3/uL (ref 3.4–10.8)

## 2019-02-02 LAB — CMP14+EGFR
ALT: 11 IU/L (ref 0–32)
AST: 17 IU/L (ref 0–40)
Albumin/Globulin Ratio: 1.8 (ref 1.2–2.2)
Albumin: 4.6 g/dL (ref 3.8–4.8)
Alkaline Phosphatase: 66 IU/L (ref 39–117)
BUN/Creatinine Ratio: 11 (ref 9–23)
BUN: 9 mg/dL (ref 6–24)
Bilirubin Total: <0.2 mg/dL (ref 0.0–1.2)
CO2: 24 mmol/L (ref 20–29)
Calcium: 10 mg/dL (ref 8.7–10.2)
Chloride: 101 mmol/L (ref 96–106)
Creatinine, Ser: 0.85 mg/dL (ref 0.57–1.00)
GFR calc Af Amer: 97 mL/min/{1.73_m2} (ref >59)
GFR calc non Af Amer: 84 mL/min/{1.73_m2} (ref >59)
Globulin, Total: 2.5 g/dL (ref 1.5–4.5)
Glucose: 122 mg/dL — ABNORMAL HIGH (ref 65–99)
Potassium: 4.2 mmol/L (ref 3.5–5.2)
Sodium: 141 mmol/L (ref 134–144)
Total Protein: 7.1 g/dL (ref 6.0–8.5)

## 2019-02-02 LAB — MAGNESIUM: Magnesium: 1.7 mg/dL (ref 1.6–2.3)

## 2019-02-06 ENCOUNTER — Encounter: Payer: Self-pay | Admitting: Family Medicine

## 2019-02-06 ENCOUNTER — Encounter (HOSPITAL_COMMUNITY): Payer: Self-pay | Admitting: Psychology

## 2019-02-06 ENCOUNTER — Ambulatory Visit (HOSPITAL_COMMUNITY): Payer: BC Managed Care – PPO | Admitting: Psychology

## 2019-02-06 DIAGNOSIS — Z09 Encounter for follow-up examination after completed treatment for conditions other than malignant neoplasm: Secondary | ICD-10-CM | POA: Insufficient documentation

## 2019-02-06 NOTE — Assessment & Plan Note (Signed)
Managed by Psych for over 15 years and controlled

## 2019-02-06 NOTE — Assessment & Plan Note (Signed)
  Patient re-educated about  the importance of commitment to a  minimum of 150 minutes of exercise per week as able.  The importance of healthy food choices with portion control discussed, as well as eating regularly and within a 12 hour window most days. The need to choose "clean , green" food 50 to 75% of the time is discussed, as well as to make water the primary drink and set a goal of 64 ounces water daily.    Weight /BMI 01/31/2019 11/23/2018 05/24/2018  WEIGHT 237 lb 240 lb 234 lb  HEIGHT 5\' 4"  5\' 4"  5' 4.5"  BMI 40.68 kg/m2 41.2 kg/m2 39.55 kg/m2  Some encounter information is confidential and restricted. Go to Review Flowsheets activity to see all data.

## 2019-02-06 NOTE — Assessment & Plan Note (Signed)
Controlled, no change in medication DASH diet and commitment to daily physical activity for a minimum of 30 minutes discussed and encouraged, as a part of hypertension management. The importance of attaining a healthy weight is also discussed.  BP/Weight 01/31/2019 11/23/2018 05/24/2018 05/03/2018 02/25/2018 01/27/2018 6/96/7893  Systolic BP 810 175 102 585 277 824 235  Diastolic BP 74 80 81 79 78 82 86  Wt. (Lbs) 237 240 234 235 239.6 242 -  BMI 40.68 41.2 39.55 39.71 40.49 40.9 -  Some encounter information is confidential and restricted. Go to Review Flowsheets activity to see all data.

## 2019-02-06 NOTE — Progress Notes (Signed)
Christina [name] is a 43 y.o. female patient who called morning of appointment to inform that need to cancel as has work meeting at same time.  Pt is scheduled to f/u on 7/20 at [facility], [facility]

## 2019-02-06 NOTE — Assessment & Plan Note (Signed)
Patient in for follow up of recent hospitalization. Discharge summary, and laboratory and radiology data are reviewed, and any questions or concerns about recent hospitalization are discussed. Specific issues requiring follow up are specifically addressed.  

## 2019-02-06 NOTE — Assessment & Plan Note (Signed)
[name] is reminded of the importance of commitment to daily physical activity for 30 minutes or more, as able and the need to limit carbohydrate intake to 30 to 60 grams per meal to help with blood sugar control.   The need to take medication as prescribed, test blood sugar as directed, and to call between visits if there is a concern that blood sugar is uncontrolled is also discussed.   [name] is reminded of the importance of daily foot exam, annual eye examination, and good blood sugar, blood pressure and cholesterol control.  Diabetic Labs Latest Ref Rng & Units 02/01/2019 11/25/2018 04/27/2018 03/11/2018 01/25/2018  HbA1c 4.8 - 5.6 % - 9.6(H) 8.9(H) - 8.5(H)  Microalbumin Not Estab. ug/mL - - - - -  Micro/Creat Ratio 0.0 - 30.0 mg/g creat - - - - -  Chol 100 - 199 mg/dL - - - - -  HDL >39 mg/dL - - - - -  Calc LDL 0 - 99 mg/dL - - - - -  Triglycerides 0 - 149 mg/dL - - - - -  Creatinine 0.57 - 1.00 mg/dL 0.85 0.83 0.90 0.80 0.81   BP/Weight 01/31/2019 11/23/2018 05/24/2018 05/03/2018 02/25/2018 01/27/2018 02/02/4034  Systolic BP 248 185 909 311 216 244 695  Diastolic BP 74 80 81 79 78 82 86  Wt. (Lbs) 237 240 234 235 239.6 242 -  BMI 40.68 41.2 39.55 39.71 40.49 40.9 -  Some encounter information is confidential and restricted. Go to Review Flowsheets activity to see all data.   Foot/eye exam completion dates Latest Ref Rng & Units 12/29/2017 07/06/2017  Eye Exam No Retinopathy No Retinopathy -  Foot Form Completion - - Done   Deteriorated, managed by Endo at [facility]

## 2019-02-20 ENCOUNTER — Ambulatory Visit (INDEPENDENT_AMBULATORY_CARE_PROVIDER_SITE_OTHER): Payer: BC Managed Care – PPO | Admitting: Psychology

## 2019-02-20 DIAGNOSIS — F411 Generalized anxiety disorder: Secondary | ICD-10-CM | POA: Diagnosis not present

## 2019-02-20 DIAGNOSIS — F3162 Bipolar disorder, current episode mixed, moderate: Secondary | ICD-10-CM

## 2019-02-20 NOTE — Progress Notes (Signed)
Virtual Visit via Telephone Note  I connected with Christina [name] on 02/20/19 at  9:00 AM EDT by telephone, as Internet connection won't support video, and verified that I am speaking with the correct person using two identifiers.   I discussed the limitations, risks, security and privacy concerns of performing an evaluation and management service by telephone and the availability of in person appointments. I also discussed with the patient that there may be a patient responsible charge related to this service. The patient expressed understanding and agreed to proceed.   History of Present Illness:    Observations/Objective:   Assessment and Plan:   Follow Up Instructions:    I discussed the assessment and treatment plan with the patient. The patient was provided an opportunity to ask questions and all were answered. The patient agreed with the plan and demonstrated an understanding of the instructions.   The patient was advised to call back or seek an in-person evaluation if the symptoms worsen or if the condition fails to improve as anticipated.  I provided 47 minutes of non-face-to-face time during this encounter.   [name] [facility]    THERAPIST PROGRESS NOTE  Session Time: 9am-9.47am  Participation Level: Active  Behavioral Response: n/a on phoneAlertaffect wn'  Type of Therapy: Individual Therapy  Treatment Goals addressed: Diagnosis: Bipolar and anxiety and goal 1  Interventions: CBT and Supportive  Summary: Christina [name] is a 43 y.o. female who presents with affect wnl.  pt has a lot going on in her home as 43y/o just made a mess w/ dish soap- so awaren intially distracted in session.  Pt reported that she has recovered from her surger- still has some soreness but doing better.  Pt reported that her dad was in the hospital last week needing a blood transfusion related to his kidney failure.  Pt discussed this as stressor and awareness for her to focus again on her  own health.  Pt discussed that she often identifies need to focus on exercise but doesn't tend to follow through.  Pt was able to acknowledge small steps taking and keeping up w/ continuing to build change and how that takes time.  Pt discussed how she starts back to school next month w/ 2 classes and looking forward to that has missed although break was needed.  Pt also discussed uncertainty of kids schooling this year w/ pandemic. Pt shared that she and husband are communicating more and  Has been positive although no change in his drinking some small improvements w/ things related to family needs.  Suicidal/Homicidal: Nowithout intent/plan  Therapist Response: assessed pt current functioning per pt report. Processed w/pt coping w/ stressors recent and w/ positives as well.  Explored w/pt how change is a process and continued steps moving forward- not overwhelming.  Encouraged pt continued increased communication in relationship.   Plan: Return again in 2 weeks, via webex. Continue as scheduled w/ [name].   Diagnosis: Bipolar 1 d/o and GAD   [name], [facility] 02/20/2019

## 2019-02-27 DIAGNOSIS — E1165 Type 2 diabetes mellitus with hyperglycemia: Secondary | ICD-10-CM | POA: Diagnosis not present

## 2019-02-27 DIAGNOSIS — D3502 Benign neoplasm of left adrenal gland: Secondary | ICD-10-CM | POA: Diagnosis not present

## 2019-02-27 DIAGNOSIS — I10 Essential (primary) hypertension: Secondary | ICD-10-CM | POA: Diagnosis not present

## 2019-02-27 DIAGNOSIS — Z794 Long term (current) use of insulin: Secondary | ICD-10-CM | POA: Diagnosis not present

## 2019-03-08 ENCOUNTER — Ambulatory Visit (HOSPITAL_COMMUNITY): Payer: BC Managed Care – PPO | Admitting: Psychology

## 2019-03-13 ENCOUNTER — Telehealth (INDEPENDENT_AMBULATORY_CARE_PROVIDER_SITE_OTHER): Payer: BC Managed Care – PPO | Admitting: Family Medicine

## 2019-03-13 ENCOUNTER — Encounter: Payer: Self-pay | Admitting: Family Medicine

## 2019-03-13 ENCOUNTER — Other Ambulatory Visit: Payer: Self-pay | Admitting: Family Medicine

## 2019-03-13 ENCOUNTER — Other Ambulatory Visit: Payer: Self-pay

## 2019-03-13 VITALS — BP 102/64 | Ht 64.0 in | Wt 237.0 lb

## 2019-03-13 DIAGNOSIS — H6693 Otitis media, unspecified, bilateral: Secondary | ICD-10-CM

## 2019-03-13 DIAGNOSIS — R42 Dizziness and giddiness: Secondary | ICD-10-CM

## 2019-03-13 DIAGNOSIS — E1165 Type 2 diabetes mellitus with hyperglycemia: Secondary | ICD-10-CM

## 2019-03-13 MED ORDER — PENICILLIN V POTASSIUM 500 MG PO TABS: 500.0000 mg | ORAL_TABLET | Freq: Three times a day (TID) | ORAL | 0 refills | Status: DC

## 2019-03-13 MED ORDER — MECLIZINE HCL 12.5 MG PO TABS: 12.5000 mg | ORAL_TABLET | Freq: Three times a day (TID) | ORAL | 0 refills | Status: DC | PRN

## 2019-03-13 MED ORDER — FLUCONAZOLE 150 MG PO TABS: 150.0000 mg | ORAL_TABLET | Freq: Once | ORAL | 0 refills | Status: AC

## 2019-03-13 MED ORDER — SULFAMETHOXAZOLE-TRIMETHOPRIM 800-160 MG PO TABS: 1.0000 | ORAL_TABLET | Freq: Two times a day (BID) | ORAL | 0 refills | Status: DC

## 2019-03-13 NOTE — Patient Instructions (Addendum)
F/U as before, call if you need me sooner.  You are treated today for bilateral ear infection ,right cervical adenitis,and for headache  And vertigo, septra, fluconazole and .antivert are prescribed  Work excuse from today to return Wednesday.  Hold cozaar for the next 2 days , please send blood pressure reading today and tomorrow to me via my chart, I anticipate that you should be able to resume the cozaar

## 2019-03-13 NOTE — Progress Notes (Signed)
Pen v

## 2019-03-15 ENCOUNTER — Telehealth: Payer: Self-pay | Admitting: Family Medicine

## 2019-03-15 ENCOUNTER — Encounter: Payer: Self-pay | Admitting: Family Medicine

## 2019-03-15 NOTE — Telephone Encounter (Signed)
pkls write new work excuse, taking pt out to return on 08/17/ 2020, she can print from her record I think

## 2019-03-19 ENCOUNTER — Encounter: Payer: Self-pay | Admitting: Family Medicine

## 2019-03-19 DIAGNOSIS — R42 Dizziness and giddiness: Secondary | ICD-10-CM | POA: Insufficient documentation

## 2019-03-19 NOTE — Assessment & Plan Note (Signed)
Uncontrolled , followed by endo Christina [name] is reminded of the importance of commitment to daily physical activity for 30 minutes or more, as able and the need to limit carbohydrate intake to 30 to 60 grams per meal to help with blood sugar control.   The need to take medication as prescribed, test blood sugar as directed, and to call between visits if there is a concern that blood sugar is uncontrolled is also discussed.   Christina [name] is reminded of the importance of daily foot exam, annual eye examination, and good blood sugar, blood pressure and cholesterol control.  Diabetic Labs Latest Ref Rng & Units 02/01/2019 11/25/2018 04/27/2018 03/11/2018 01/25/2018  HbA1c 4.8 - 5.6 % - 9.6(H) 8.9(H) - 8.5(H)  Microalbumin Not Estab. ug/mL - - - - -  Micro/Creat Ratio 0.0 - 30.0 mg/g creat - - - - -  Chol 100 - 199 mg/dL - - - - -  HDL >39 mg/dL - - - - -  Calc LDL 0 - 99 mg/dL - - - - -  Triglycerides 0 - 149 mg/dL - - - - -  Creatinine 0.57 - 1.00 mg/dL 0.85 0.83 0.90 0.80 0.81   BP/Weight 03/13/2019 01/31/2019 11/23/2018 05/24/2018 05/03/2018 02/25/2018 02/08/6437  Systolic BP 381 840 375 436 067 703 403  Diastolic BP 64 74 80 81 79 78 82  Wt. (Lbs) 237 237 240 234 235 239.6 242  BMI 40.68 40.68 41.2 39.55 39.71 40.49 40.9  Some encounter information is confidential and restricted. Go to Review Flowsheets activity to see all data.   Foot/eye exam completion dates Latest Ref Rng & Units 12/29/2017 07/06/2017  Eye Exam No Retinopathy No Retinopathy -  Foot Form Completion - - Done

## 2019-03-19 NOTE — Assessment & Plan Note (Signed)
Septra prescribed with fluconazole for as needed use

## 2019-03-19 NOTE — Assessment & Plan Note (Signed)
antivert prescribed

## 2019-03-19 NOTE — Assessment & Plan Note (Signed)
  Patient re-educated about  the importance of commitment to a  minimum of 150 minutes of exercise per week as able.  The importance of healthy food choices with portion control discussed, as well as eating regularly and within a 12 hour window most days. The need to choose "clean , green" food 50 to 75% of the time is discussed, as well as to make water the primary drink and set a goal of 64 ounces water daily.    Weight /BMI 03/13/2019 01/31/2019 11/23/2018  WEIGHT 237 lb 237 lb 240 lb  HEIGHT 5\' 4"  5\' 4"  5\' 4"   BMI 40.68 kg/m2 40.68 kg/m2 41.2 kg/m2  Some encounter information is confidential and restricted. Go to Review Flowsheets activity to see all data.

## 2019-03-19 NOTE — Progress Notes (Addendum)
Christina [name]     MRN: [id number]      DOB: 1975-10-25  This visit type is conducted due to national recommendations for restrictions regarding the COVID -19 Pandemic. Due to the patient's age and / or co morbidities, this format is felt to be most appropriate at this time without adequate follow up. The patient has no access to video technology/ had technical difficulties with video, requiring transitioning to audio format  only ( telephone ). All issues noted this document were discussed and addressed,no physical exam can be performed in this format.  Pt location: her workplace MD location: office    HPI Christina [name]  C/o falling 4 days ago, then the following day to present has had bilateral ear pain, some sinus drainage and headache. Denies cough, sore throat or SOB, had a temp of 99.9 the day after the fall  ROS See HPI  Denies chest pains, palpitations and leg swelling Denies abdominal pain, nausea, vomiting,diarrhea or constipation.   Denies dysuria, frequency, hesitancy or incontinence. Denies joint pain, swelling and limitation in mobility. Denies  seizures, numbness, or tingling. Denies uncontrolled  depression, anxiety or insomnia. Denies skin break down or rash.   PE BP 102/64   Ht 5\' 4"  (1.626 m)   Wt 237 lb (107.5 kg)   BMI 40.68 kg/m  Good communication with no confusion and intact memory. Alert and oriented x 3 No signs of respiratory distress during speech       Assessment & Plan Vertigo antivert prescribed  Bilateral otitis media Septra prescribed with fluconazole for as needed use  Uncontrolled type 2 diabetes mellitus with hyperglycemia (Christina [name]) Uncontrolled , followed by endo Christina [name] is reminded of the importance of commitment to daily physical activity for 30 minutes or more, as able and the need to limit carbohydrate intake to 30 to 60 grams per meal to help with blood sugar control.   The need to take medication as prescribed, test blood sugar  as directed, and to call between visits if there is a concern that blood sugar is uncontrolled is also discussed.   Christina [name] is reminded of the importance of daily foot exam, annual eye examination, and good blood sugar, blood pressure and cholesterol control.  Diabetic Labs Latest Ref Rng & Units 02/01/2019 11/25/2018 04/27/2018 03/11/2018 01/25/2018  HbA1c 4.8 - 5.6 % - 9.6(H) 8.9(H) - 8.5(H)  Microalbumin Not Estab. ug/mL - - - - -  Micro/Creat Ratio 0.0 - 30.0 mg/g creat - - - - -  Chol 100 - 199 mg/dL - - - - -  HDL >39 mg/dL - - - - -  Calc LDL 0 - 99 mg/dL - - - - -  Triglycerides 0 - 149 mg/dL - - - - -  Creatinine 0.57 - 1.00 mg/dL 0.85 0.83 0.90 0.80 0.81   BP/Weight 03/13/2019 01/31/2019 11/23/2018 05/24/2018 05/03/2018 02/25/2018 7/37/1062  Systolic BP 694 854 627 035 009 381 829  Diastolic BP 64 74 80 81 79 78 82  Wt. (Lbs) 237 237 240 234 235 239.6 242  BMI 40.68 40.68 41.2 39.55 39.71 40.49 40.9  Some encounter information is confidential and restricted. Go to Review Flowsheets activity to see all data.   Foot/eye exam completion dates Latest Ref Rng & Units 12/29/2017 07/06/2017  Eye Exam No Retinopathy No Retinopathy -  Foot Form Completion - - Done        Morbid obesity  Patient re-educated about  the importance of commitment to  a  minimum of 150 minutes of exercise per week as able.  The importance of healthy food choices with portion control discussed, as well as eating regularly and within a 12 hour window most days. The need to choose "clean , green" food 50 to 75% of the time is discussed, as well as to make water the primary drink and set a goal of 64 ounces water daily.    Weight /BMI 03/13/2019 01/31/2019 11/23/2018  WEIGHT 237 lb 237 lb 240 lb  HEIGHT 5\' 4"  5\' 4"  5\' 4"   BMI 40.68 kg/m2 40.68 kg/m2 41.2 kg/m2  Some encounter information is confidential and restricted. Go to Review Flowsheets activity to see all data.     Time spent in non face to face time is  22 minutes

## 2019-03-20 NOTE — Telephone Encounter (Signed)
New work note complete

## 2019-03-22 ENCOUNTER — Other Ambulatory Visit: Payer: Self-pay

## 2019-03-22 ENCOUNTER — Ambulatory Visit (INDEPENDENT_AMBULATORY_CARE_PROVIDER_SITE_OTHER): Payer: BC Managed Care – PPO | Admitting: Psychology

## 2019-03-22 ENCOUNTER — Ambulatory Visit: Payer: BC Managed Care – PPO | Admitting: Gastroenterology

## 2019-03-22 ENCOUNTER — Encounter: Payer: Self-pay | Admitting: Gastroenterology

## 2019-03-22 DIAGNOSIS — K760 Fatty (change of) liver, not elsewhere classified: Secondary | ICD-10-CM | POA: Diagnosis not present

## 2019-03-22 DIAGNOSIS — F3162 Bipolar disorder, current episode mixed, moderate: Secondary | ICD-10-CM | POA: Diagnosis not present

## 2019-03-22 DIAGNOSIS — K3184 Gastroparesis: Secondary | ICD-10-CM | POA: Diagnosis not present

## 2019-03-22 DIAGNOSIS — K219 Gastro-esophageal reflux disease without esophagitis: Secondary | ICD-10-CM | POA: Diagnosis not present

## 2019-03-22 MED ORDER — B-12 1000 MCG PO CAPS: ORAL_CAPSULE | ORAL | 11 refills | Status: DC

## 2019-03-22 MED ORDER — VITAMIN D 25 MCG (1000 UNIT) PO TABS: 2000.0000 [IU] | ORAL_TABLET | Freq: Every day | ORAL | 11 refills | Status: DC

## 2019-03-22 MED ORDER — ADULT MULTIVITAMIN W/MINERALS CH: ORAL_TABLET | ORAL | 11 refills | Status: DC

## 2019-03-22 MED ORDER — OMEPRAZOLE 20 MG PO CPDR: 20.0000 mg | DELAYED_RELEASE_CAPSULE | Freq: Every day | ORAL | 3 refills | Status: DC

## 2019-03-22 NOTE — Progress Notes (Signed)
Virtual Visit via Video Note  I connected with Christina [name] on 03/22/19 at 11:00 AM EDT by a video enabled telemedicine application and verified that I am speaking with the correct person using two identifiers.   I discussed the limitations of evaluation and management by telemedicine and the availability of in person appointments. The patient expressed understanding and agreed to proceed    I discussed the assessment and treatment plan with the patient. The patient was provided an opportunity to ask questions and all were answered. The patient agreed with the plan and demonstrated an understanding of the instructions.   The patient was advised to call back or seek an in-person evaluation if the symptoms worsen or if the condition fails to improve as anticipated.  I provided 53 minutes of non-face-to-face time during this encounter.   [name] [facility]    THERAPIST PROGRESS NOTE  Session Time: 11am-11.53am  Participation Level: Active  Behavioral Response: Well GroomedAlertaffect  Type of Therapy: Individual Therapy  Treatment Goals addressed: Diagnosis: bipolar 1 d/o and goal 1.  Interventions: CBT and Strength-based  Summary: Christina [name] is a 43 y.o. female who presents with affect wnl.  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  Pt reported that she is feeling excited about school and feels that manageable at this time.  Pt reported on insight of perfection and not wanting others to see in negative light how shaped by experiences and bullying.  Pt discussed how she has tried to stay under the radar and not call attention to self and awareness that even if giving no reason people will sometimes find something to talk about.  Pt shared about recent experience w/ this and how reflecting on importance of what other think.  Pt discussed therapy and sometimes question whether need based on whether  deserves (others may need it more thoughts).  Pt awareness of how therapy can benefit her and place of nonjudgment for self care..   Suicidal/Homicidal: Nowithout intent/plan  Therapist Response: Assessed pt current functioning per pt report. Processed w/pt coping w/ transition of work, school.  Explored w/pt her self worth and how impacted by experiences and how developed patterns of coping- whether working for her or not.  Discussed pt tx and process- having pt identify her needs and reflecting as non judgment and self exploration for making decisions.    Plan: Return again in 3 weeks, via webex  Diagnosis: Bipolar 1 d/o    [name] [facility] 03/22/2019

## 2019-03-22 NOTE — Assessment & Plan Note (Signed)
BMI > 40 .  DRINK WATER TO KEEP YOUR URINE LIGHT YELLOW. EAT TO LIVE AND THINK OF FOOD AS MEDICINE. DRINK WATER TO KEEP YOUR URINE LIGHT YELLOW.  To have more energy and to lose weight:      1. CONTINUE YOUR WEIGHT LOSS EFFORTS. I RECOMMEND YOU READ AND FOLLOW RECOMMENDATIONS BY DR. MARK HYMAN, "10-DAY DETOX DIET". Set a date 30 days from now to start the DETOX.   2. If you must eat bread, EAT EZEKIEL BREAD. IT IS IN THE FROZEN SECTION OF THE GROCERY STORE.   3. Do not drink SODA, GATORADE, ENERGY DRINKS, OR DIET SODA.    4. AVOID HIGH FRUCTOSE CORN SYRUP AND DAIRY.    5. DO NOT chew SUGAR FREE GUM OR USE ARTIFICIAL SWEETENERS. IF NEEDED, USE STEVIA AS A SWEETENER.   6. DO NOT EAT ENRICHED WHEAT FLOUR, PASTA, RICE, OR CEREAL.   7. DO NOT EAT FARM RAISED FISH. ONLY EAT WILD CAUGHT SEAFOOD, GRASS FED BEEF OR CHICKEN, PASTURE RAISED PIGS, OR EGGS FROM PASTURE RAISED CHICKENS.   8. PRACTICE YOGA FOR 30 MINS 3 OR 4 TIMES A WEEK AND PROGRESS TO HATHA YOGA OVER NEXT 6 MOS.   9.  START MULTIVITAMIN, VITAMIN B12, AND VITAMIN D3 2000 IU DAILY.    FOLLOW UP IN 6 MOS TO ONE YEAR.

## 2019-03-22 NOTE — Assessment & Plan Note (Signed)
SYMPTOMS FAIRLY WELL CONTROLLED.  CONTINUE TO MONITOR SYMPTOMS. CONTINUE OMEPRAZOLE.  TAKE 30 MINUTES PRIOR TO YOUR FIRST MEAL. REFILL X1 YR. FOLLOW UP IN 6 MOS TO 1 YEAR.

## 2019-03-22 NOTE — Assessment & Plan Note (Signed)
SYMPTOMS FAIRLY WELL CONTROLLED.  CONTINUE TO MONITOR SYMPTOMS. FOLLOW UP IN 6 MOS TO 1 YEAR.

## 2019-03-22 NOTE — Patient Instructions (Addendum)
DRINK WATER TO KEEP YOUR URINE LIGHT YELLOW.  EAT TO LIVE AND THINK OF FOOD AS MEDICINE.  DRINK WATER TO KEEP YOUR URINE LIGHT YELLOW.   To have more energy and to lose weight:      1. CONTINUE YOUR WEIGHT LOSS EFFORTS. I RECOMMEND YOU READ AND FOLLOW RECOMMENDATIONS BY DR. MARK HYMAN, "10-DAY DETOX DIET". Set a date 30 days from now to start the DETOX.    2. If you must eat bread, EAT EZEKIEL BREAD. IT IS IN THE FROZEN SECTION OF THE GROCERY STORE.    3. Do not drink SODA, GATORADE, ENERGY DRINKS, OR DIET SODA.     4. AVOID HIGH FRUCTOSE CORN SYRUP AND DAIRY.     5. DO NOT chew SUGAR FREE GUM OR USE ARTIFICIAL SWEETENERS. IF NEEDED, USE STEVIA AS A SWEETENER.    6. DO NOT EAT ENRICHED WHEAT FLOUR, PASTA, RICE, OR CEREAL.    7. DO NOT EAT FARM RAISED FISH. ONLY EAT WILD CAUGHT SEAFOOD, GRASS FED BEEF OR CHICKEN, PASTURE RAISED PIGS, OR EGGS FROM PASTURE RAISED CHICKENS.    8. PRACTICE YOGA FOR 30 MINS 3 OR 4 TIMES A WEEK AND PROGRESS TO HATHA YOGA OVER NEXT 6 MOS.    9.  START MULTIVITAMIN, VITAMIN B12, AND VITAMIN D3 2000 IU DAILY.    FOLLOW UP IN 6 MOS TO ONE YEAR.

## 2019-03-22 NOTE — Progress Notes (Signed)
Subjective:    Patient ID: Christina [name], female    DOB: 12-Nov-1975, 43 y.o.   MRN: [id number]  HPI May have vomiting: 1-2x/mo. SYMPTOMS LAST A FEW HOURS. SAME WITH NAUSEA. BMs: regular. ABDOMINAL PAIN LEFT OVER FROM THE SURGERY(SAME SINCE MAY 2020:UMBILICAL HERNIA REPAIR/ LEFT ADRENALECTOMY COMPLICATED BY ABSCESS). HEARTBURN: CONTROLLED WITH MEDS IF RUNS OUT OF MEDS.  PT DENIES FEVER, CHILLS, HEMATOCHEZIA, HEMATEMESIS, nausea, vomiting, melena, diarrhea, CHEST PAIN, SHORTNESS OF BREATH, CHANGE IN BOWEL IN HABITS, constipation, problems swallowing,OR problems with sedation.  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 Oneida Alar: Torturous transverse and sigmoid colon, internal hemorrhoids.  Next colonoscopy in 10 years with MAC and color wrap   dermoid tumor  [place] N/A 12/10/2016   Procedure: DILATATION & CURETTAGE/HYSTEROSCOPY WITH NOVASURE ABLATION;  Surgeon: Ena Dawley, MD;  Location: [facility];  Service: Gynecology;  Laterality: N/A;   ESOPHAGOGASTRODUODENOSCOPY  07/01/2009   GNO:IBBCWU esphagus without barrett's/dilation with 16 mm/mild erthyema in the antrum. mild chronic gastritis on path.    ESOPHAGOGASTRODUODENOSCOPY (EGD) WITH PROPOFOL N/A 10/26/2017   Dr. Oneida Alar: Gastritis, gastric  polyps.  Biopsies benign.  Small bowel biopsies negative for celiac.  No H. pylori.   TRIGGER FINGER RELEASE Bilateral 06/2015   TUBAL LIGATION Bilateral 03/06/2016   Procedure: BILATERAL TUBAL LIGATION;  Surgeon: Ena Dawley, MD;  Location: [facility];  Service: Obstetrics;  Laterality: Bilateral;    Allergies  Allergen Reactions   Other Anaphylaxis    Tree nuts   Peanut Oil Anaphylaxis   Peanut-Containing Drug Products Anaphylaxis   Augmentin [Amoxicillin-Pot Clavulanate] Nausea And Vomiting       Current Outpatient Medications  Medication Sig     acetaminophen (TYLENOL) 500 MG tablet Take 1,000 mg every 8 (eight) hours as needed by mouth for mild pain.      ARIPiprazole (ABILIFY) 5 MG tablet Take 1 tablet (5 mg total) by mouth every evening.     B-D UF III MINI PEN NEEDLES 31G X 5 MM MISC USE AS DIRECTED     Coenzyme Q10 (CO Q-10) 300 MG CAPS Take by mouth. Once daily     fluticasone (FLONASE) 50 MCG/ACT nasal spray Place 2 sprays into both nostrils daily.     glucose blood test strip Use as instructed, three times daily     ibuprofen (ADVIL,MOTRIN) 200 MG tablet Take 600 mg by mouth every 8 (eight) hours as needed (FOR PAIN.).     Insulin Glargine (LANTUS) 100 UNIT/ML Solostar Pen Inject 45 Units into the skin at bedtime.     loratadine (CLARITIN) 10 MG tablet Take 1 tablet (10 mg total) by mouth daily. Taking generic otc allergy (Patient taking differently: Take 10 mg by mouth every evening.  Taking generic otc allergy)     losartan (COZAAR) 25 MG tablet Take 1 tablet (25 mg total) by mouth daily.     meclizine (ANTIVERT) 12.5 MG tablet Take 1 tablet (12.5 mg total) by mouth 3 (three) times daily as needed for dizziness.     metFORMIN (GLUCOPHAGE) 1000 MG tablet Take 1 tablet (1,000 mg total) by mouth 2 (two) times daily with a meal.     montelukast (SINGULAIR) 10 MG tablet Take 1 tablet (10 mg total) by mouth at bedtime.     omeprazole (PRILOSEC) 20 MG  capsule Take 1 capsule (20 mg total) by mouth daily before breakfast.     ONETOUCH DELICA LANCETS 16X MISC Three times daily testing dx e11.9           sodium chloride (OCEAN) 0.65 % SOLN nasal spray Place 1 spray into both nostrils 4 (four) times daily as needed for congestion.     triamterene-hydrochlorothiazide (MAXZIDE-25) 37.5-25 MG tablet Take 1 tablet by mouth daily.     valACYclovir (VALTREX) 500 MG tablet Take 1,000 mg by mouth daily as needed (for 3 days if needed for out breaks).            Review of Systems PER HPI OTHERWISE ALL SYSTEMS ARE NEGATIVE.    Objective:   Physical Exam Vitals signs reviewed.  Constitutional:      General: She is not in acute distress.    Appearance: She is well-developed.  HENT:     Head: Normocephalic and atraumatic.     Mouth/Throat:     Pharynx: No oropharyngeal exudate.  Eyes:     General: No scleral icterus.    Pupils: Pupils are equal, round, and reactive to light.  Neck:     Musculoskeletal: Normal range of motion and neck supple.  Cardiovascular:     Rate and Rhythm: Normal rate and regular rhythm.     Heart sounds: Normal heart sounds.  Pulmonary:     Effort: Pulmonary effort is normal. No respiratory distress.     Breath sounds: Normal breath sounds.  Abdominal:     General: Bowel sounds are normal. There is no distension.     Palpations: Abdomen is soft.     Tenderness: There is abdominal tenderness (mild left periumbical tenderness). There is no guarding or rebound.  Musculoskeletal:     Right lower leg: No edema.     Left lower leg: No edema.  Lymphadenopathy:     Cervical: No cervical adenopathy.  Neurological:     Mental Status: She is alert and oriented to person, place, and time.     Comments: NO  NEW FOCAL DEFICITS  Psychiatric:        Mood and Affect: Mood normal.        Thought Content: Thought content normal.       Assessment & Plan:

## 2019-03-23 NOTE — Progress Notes (Signed)
On recall  °

## 2019-03-23 NOTE — Progress Notes (Signed)
cc'ed to pcp °

## 2019-03-27 ENCOUNTER — Ambulatory Visit (INDEPENDENT_AMBULATORY_CARE_PROVIDER_SITE_OTHER): Payer: BC Managed Care – PPO

## 2019-03-27 ENCOUNTER — Other Ambulatory Visit: Payer: Self-pay

## 2019-03-27 DIAGNOSIS — Z23 Encounter for immunization: Secondary | ICD-10-CM

## 2019-04-05 ENCOUNTER — Other Ambulatory Visit: Payer: Self-pay

## 2019-04-05 ENCOUNTER — Ambulatory Visit (HOSPITAL_COMMUNITY): Payer: BC Managed Care – PPO | Admitting: Psychiatry

## 2019-04-06 ENCOUNTER — Ambulatory Visit (HOSPITAL_COMMUNITY): Payer: BC Managed Care – PPO | Admitting: Psychiatry

## 2019-04-12 ENCOUNTER — Other Ambulatory Visit: Payer: Self-pay

## 2019-04-12 ENCOUNTER — Ambulatory Visit (INDEPENDENT_AMBULATORY_CARE_PROVIDER_SITE_OTHER): Payer: BC Managed Care – PPO | Admitting: Psychiatry

## 2019-04-12 ENCOUNTER — Encounter (HOSPITAL_COMMUNITY): Payer: Self-pay | Admitting: Psychiatry

## 2019-04-12 DIAGNOSIS — F411 Generalized anxiety disorder: Secondary | ICD-10-CM

## 2019-04-12 DIAGNOSIS — F3162 Bipolar disorder, current episode mixed, moderate: Secondary | ICD-10-CM

## 2019-04-12 MED ORDER — ARIPIPRAZOLE 5 MG PO TABS: 5.0000 mg | ORAL_TABLET | Freq: Every evening | ORAL | 0 refills | Status: DC

## 2019-04-12 NOTE — Progress Notes (Signed)
Virtual Visit via Telephone Note  I connected with Christina [name] on 04/12/19 at  2:20 PM EDT by telephone and verified that I am speaking with the correct person using two identifiers.   I discussed the limitations, risks, security and privacy concerns of performing an evaluation and management service by telephone and the availability of in person appointments. I also discussed with the patient that there may be a patient responsible charge related to this service. The patient expressed understanding and agreed to proceed.   History of Present Illness: Patient was evaluated by phone session.  She is doing much better on her medicine.  She recently had surgery for her adrenal gland but need to be readmitted due to complication and infection.  She stayed few days in the hospital at [facility] but now feeling better.  She resume work.  Her job is going well.  She lost 12 pounds since the last visit.  She believes it is after the surgery and also she is trying to lose weight.  She admitted her energy level is good.  She denies any mania, psychosis, hallucination, irritability or mood swings.  She is sleeping good.  Her husband is doing home schooling.  She had a good support from her husband and her loss.  She has no tremors shakes or any EPS.  She is checking her blood sugar and believes she had much better control on her diabetes.  She has upcoming appointment to have hemoglobin A1c.  She also getting therapy once a month with [name].  She denies drinking or using any illegal substances.    Past Psychiatric History:Reviewed. H/Odepression, anxiety, mania and psychosis. Admitted in [facility] at [facility] due to suicidal gestures. Seen inthis office since 2006. TriedProzac and Wellbutrin.     Psychiatric Specialty Exam: Physical Exam  ROS  unknown if currently breastfeeding.There is no height or weight on file to calculate BMI.  General Appearance: NA  Eye Contact:  NA  Speech:  Clear and Coherent   Volume:  Normal  Mood:  Euthymic  Affect:  NA  Thought Process:  Goal Directed  Orientation:  Full (Time, Place, and Person)  Thought Content:  Logical  Suicidal Thoughts:  No  Homicidal Thoughts:  No  Memory:  Immediate;   Good Recent;   Good Remote;   Good  Judgement:  Good  Insight:  Good  Psychomotor Activity:  NA  Concentration:  Concentration: Fair and Attention Span: Good  Recall:  Good  Fund of Knowledge:  Good  Language:  Good  Akathisia:  No  Handed:  Right  AIMS (if indicated):     Assets:  Communication Skills Desire for [place] [employer]/Skills Transportation  ADL's:  Intact  Cognition:  WNL  Sleep:   ok      Assessment and Plan: Bipolar disorder type I.  Generalized anxiety disorder.  Patient is a stable on her current medication.  She does not want to change since it is working very well for her.  Her mood is a stable.  Continue Abilify 5 mg daily and continue therapy with Forest Gleason.  Discussed medication side effects and benefits.  Recommended to call us back if she has any question or any concern.  Follow-up in 3 months.  Follow Up Instructions:    I discussed the assessment and treatment plan with the patient. The patient was provided an opportunity to ask questions and all were answered. The patient agreed with the plan and demonstrated  an understanding of the instructions.   The patient was advised to call back or seek an in-person evaluation if the symptoms worsen or if the condition fails to improve as anticipated.  I provided 20 minutes of non-face-to-face time during this encounter.   Kathlee Nations, MD

## 2019-04-18 ENCOUNTER — Ambulatory Visit (INDEPENDENT_AMBULATORY_CARE_PROVIDER_SITE_OTHER): Payer: BC Managed Care – PPO | Admitting: Psychology

## 2019-04-18 ENCOUNTER — Other Ambulatory Visit: Payer: Self-pay

## 2019-04-18 DIAGNOSIS — F3162 Bipolar disorder, current episode mixed, moderate: Secondary | ICD-10-CM

## 2019-04-18 NOTE — Progress Notes (Signed)
Virtual Visit via Video Note  I connected with Christina [name] on 04/18/19 at 12:30 PM EDT by a video enabled telemedicine application and verified that I am speaking with the correct person using two identifiers.   I discussed the limitations of evaluation and management by telemedicine and the availability of in person appointments. The patient expressed understanding and agreed to proceed.    I discussed the assessment and treatment plan with the patient. The patient was provided an opportunity to ask questions and all were answered. The patient agreed with the plan and demonstrated an understanding of the instructions.   The patient was advised to call back or seek an in-person evaluation if the symptoms worsen or if the condition fails to improve as anticipated.  I provided 47 minutes of non-face-to-face time during this encounter.   [name] [facility]   THERAPIST PROGRESS NOTE  Session Time: 12.30pm-1.17pm  Participation Level: Active  Behavioral Response: Well GroomedAlertaffect wnl  Type of Therapy: Individual Therapy  Treatment Goals addressed: Diagnosis: bipolar 1 d/o and goal 1.  Interventions: CBT and Supportive  Summary: Christina [name] is a 43 y.o. female who presents with affect wnl.  pt reported that she has been at times overwhelmed w/ school, work and family life.  Pt reported that this hit over the weekend but she focused on one thing instead of ruminating about everything and this helped to get through.  Pt reported that homeschooling her husband is doing is going well for her kids.  Pt reported that some family stressors w/ dad's medical and not keeping her informed.  Pt discussed husband's alcoholism and impact having and how interesting to be observing as she is currently in SA class in grad program.  Pt recognizes that she can't do for him and has come to accept but also aware how impacting and how she shifts when he drunk and not present for children or in  relationship.  Pt awareness that no easier answer or path for her w/ how she wants to address or not.  Pt decided that she did want to communicate w/her husband re: this.  .   Suicidal/Homicidal: Nowithout intent/plan  Therapist Response: Assessed pt current functioning per pt report.  Processed w/pt her coping through stressors and feeling overwhelmed- acknowledging use of skills to manage.  Explored w/pt impact of alcoholism on relationship and family unit and how she is responding and not a black and white response.    Plan: Return again in 2 weeks, via webex.  Diagnosis: Bipolar 1 d/o  [name] [facility] 04/18/2019

## 2019-05-02 ENCOUNTER — Other Ambulatory Visit: Payer: Self-pay

## 2019-05-02 ENCOUNTER — Ambulatory Visit (INDEPENDENT_AMBULATORY_CARE_PROVIDER_SITE_OTHER): Payer: BC Managed Care – PPO | Admitting: Psychology

## 2019-05-02 DIAGNOSIS — F3162 Bipolar disorder, current episode mixed, moderate: Secondary | ICD-10-CM | POA: Diagnosis not present

## 2019-05-02 NOTE — Progress Notes (Signed)
Virtual Visit via Video Note  I connected with Hadasa A Takayama on 05/02/19 at  3:30 PM EDT by a video enabled telemedicine application and verified that I am speaking with the correct person using two identifiers.   I discussed the limitations of evaluation and management by telemedicine and the availability of in person appointments. The patient expressed understanding and agreed to proceed.    I discussed the assessment and treatment plan with the patient. The patient was provided an opportunity to ask questions and all were answered. The patient agreed with the plan and demonstrated an understanding of the instructions.   The patient was advised to call back or seek an in-person evaluation if the symptoms worsen or if the condition fails to improve as anticipated.  I provided 50 minutes of non-face-to-face time during this encounter.   [name] [facility]    THERAPIST PROGRESS NOTE  Session Time: 3.30pm-4.20pm  Participation Level: Active  Behavioral Response: Well GroomedAlertaffect wnl  Type of Therapy: Individual Therapy  Treatment Goals addressed: Diagnosis: Bipolar 1 d/o  Interventions: CBT and Supportive  Summary: BREEZA FERRARIO is a 43 y.o. female who presents with affect wnl.  pt reported that yesterday she was very overwhelmed and was struggling.  Pt reported that was everything from work stress, to school, to home.  Pt was able to gain awareness that she was dealing w/ other stressors w/ her parents health and new information- dad declined for kidney transplant, mom also has congestive heart failure.  Pt reported that she wanted to crawl up and go to bed.  Pt reported that she did log off her class as realized couldn't be effective w/ that and reported that her teacher was understanding.  Pt reported that she also disclose to husband that had noticed increased isolating and drinking over past week- which he deflected.  Pt discussed how today she focused on her self care and  made that priority and was willing to accept help from team to take things off plate.  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  Pt reported that he also communicated that she is good employee and knows she gets her job done.  Pt also reported for her abstainance project she has been able to cut out her refined foods and this has benefited her diabetes and seen that she could accomplish this for self.     Suicidal/Homicidal: Nowithout intent/plan  Therapist Response: Assessed pt current functioning per pt report.  Processed w/pt coping w/ difficult day- gaining insight into contributing factors and seeing how focus on self care for coping helped today.  Focused and reflected pt strengths.  Plan: Return again in 2 weeks, via webex.  Diagnosis: Bipolar 1 d/o   [name] [facility] 05/02/2019

## 2019-05-18 ENCOUNTER — Other Ambulatory Visit: Payer: Self-pay

## 2019-05-18 ENCOUNTER — Ambulatory Visit (HOSPITAL_COMMUNITY): Payer: BC Managed Care – PPO | Admitting: Psychology

## 2019-05-18 ENCOUNTER — Ambulatory Visit (INDEPENDENT_AMBULATORY_CARE_PROVIDER_SITE_OTHER): Payer: BC Managed Care – PPO | Admitting: Psychology

## 2019-05-18 DIAGNOSIS — F411 Generalized anxiety disorder: Secondary | ICD-10-CM | POA: Diagnosis not present

## 2019-05-18 DIAGNOSIS — F3162 Bipolar disorder, current episode mixed, moderate: Secondary | ICD-10-CM

## 2019-05-18 NOTE — Progress Notes (Signed)
Virtual Visit via Video Note  I connected with Christina [name] on 05/18/19 at 12:30 PM EDT by a video enabled telemedicine application and verified that I am speaking with the correct person using two identifiers.   I discussed the limitations of evaluation and management by telemedicine and the availability of in person appointments. The patient expressed understanding and agreed to proceed.  History of Present Illness:    Observations/Objective:   Assessment and Plan:   Follow Up Instructions:    I discussed the assessment and treatment plan with the patient. The patient was provided an opportunity to ask questions and all were answered. The patient agreed with the plan and demonstrated an understanding of the instructions.   The patient was advised to call back or seek an in-person evaluation if the symptoms worsen or if the condition fails to improve as anticipated.  I provided 51 minutes of non-face-to-face time during this encounter.   Christina [name] [facility]    THERAPIST PROGRESS NOTE  Session Time: 12.30pm-1.21pm  Participation Level: Active  Behavioral Response: Well GroomedAlertaffect wnl  Type of Therapy: Individual Therapy  Treatment Goals addressed: Diagnosis: Bipolar 1, anxiety and goal 1.  Interventions: CBT and Strength-based  Summary: Christina [name] is a 43 y.o. female who presents with affect wnl.  pt reported that she was able to make through last week when we spoke w/ all the stressors.  Pt reported she has really been focusing on her self care and taking "brain breaks" during the day and feels beneficial.  Pt discussed how she is feeling the fatigue w/ the pandemic and the changes that continues to cause for her and family.  Pt reported on how she is planning for her workers w/ supporting them w/ self care.  Pt reported supportively confronted husband recently- he discussed how not trying to buy because won't drink if not around. Pt discussed that although  managing right now w/ her husband's drinking- wondering when won't be able to.  Pt reflected on times in past when this was the case and when did sought help from mother in law.  Pt recognized that although husband disliked in past- did for support not to get in trouble.  Pt recognized that it is ok to ask for help when needed.  Suicidal/Homicidal: Nowithout intent/plan  Therapist Response: Assessed pt current functioning per pt report.  Processed w/pt coping w/ stressors of pandemic, work and home.  Discussed importance of her self care and how she is using.  Processed w/pt impact of husband's drinking on her and recognizing that ok to seek support.  Plan: Return again in 2 weeks, via webex.  F/u as scheduled w/ [name].  Diagnosis: Bipolar 1 d/o and GAD  Christina [name], [name] 05/18/2019

## 2019-05-29 ENCOUNTER — Ambulatory Visit: Payer: Self-pay | Admitting: Family Medicine

## 2019-05-30 ENCOUNTER — Ambulatory Visit (INDEPENDENT_AMBULATORY_CARE_PROVIDER_SITE_OTHER): Payer: BC Managed Care – PPO | Admitting: Family Medicine

## 2019-05-30 ENCOUNTER — Ambulatory Visit (INDEPENDENT_AMBULATORY_CARE_PROVIDER_SITE_OTHER): Payer: BC Managed Care – PPO | Admitting: Psychology

## 2019-05-30 ENCOUNTER — Encounter: Payer: Self-pay | Admitting: Family Medicine

## 2019-05-30 ENCOUNTER — Other Ambulatory Visit: Payer: Self-pay

## 2019-05-30 VITALS — BP 130/84 | HR 96 | Temp 97.4°F | Resp 15 | Ht 64.5 in | Wt 231.0 lb

## 2019-05-30 DIAGNOSIS — E1169 Type 2 diabetes mellitus with other specified complication: Secondary | ICD-10-CM

## 2019-05-30 DIAGNOSIS — I10 Essential (primary) hypertension: Secondary | ICD-10-CM

## 2019-05-30 DIAGNOSIS — F3162 Bipolar disorder, current episode mixed, moderate: Secondary | ICD-10-CM

## 2019-05-30 DIAGNOSIS — Z23 Encounter for immunization: Secondary | ICD-10-CM | POA: Diagnosis not present

## 2019-05-30 DIAGNOSIS — E669 Obesity, unspecified: Secondary | ICD-10-CM

## 2019-05-30 DIAGNOSIS — E1159 Type 2 diabetes mellitus with other circulatory complications: Secondary | ICD-10-CM | POA: Diagnosis not present

## 2019-05-30 DIAGNOSIS — D508 Other iron deficiency anemias: Secondary | ICD-10-CM

## 2019-05-30 NOTE — Progress Notes (Signed)
Virtual Visit via Video Note  I connected with Christina [name] on 05/30/19 at  1:30 PM EDT by a video enabled telemedicine application and verified that I am speaking with the correct person using two identifiers.   I discussed the limitations of evaluation and management by telemedicine and the availability of in person appointments. The patient expressed understanding and agreed to proceed.    I discussed the assessment and treatment plan with the patient. The patient was provided an opportunity to ask questions and all were answered. The patient agreed with the plan and demonstrated an understanding of the instructions.   The patient was advised to call back or seek an in-person evaluation if the symptoms worsen or if the condition fails to improve as anticipated.  I provided 50 minutes of non-face-to-face time during this encounter.   Christina [name] [facility]    THERAPIST PROGRESS NOTE  Session Time: 1.30pm-2.20pm  Participation Level: Active  Behavioral Response: Well GroomedAlertaffect congruent  Type of Therapy: Individual Therapy  Treatment Goals addressed: Diagnosis: Bipolar 1 d/o  Interventions: CBT, Strength-based and Supportive  Summary: Christina [name] is a 43 y.o. female who presents with affect congruent w/ report of stressed, down.  Pt reported that yesterday was tiring w/ work, school, asserting self at [employer], husband drinking before her class ended and youngest getting into things during that time. Then today her coworker asked if she was ok and if she had down something wrong.  Pt was aware that she was focus on taking care of self today and didn't need to "babysit" and have to attend to someone else's issues. Pt reported that she did take a brain break for self early when needed- went on walk and was helpful to get things refocused.  Pt discussed how she has been able to find a counselor for son, but w/ current lack of child care options- recognize that can't attend  to couples therapy at this point.  Pt discussed role she plays in protecting kids and how this is similar to how mom.    Suicidal/Homicidal: Nowithout intent/plan  Therapist Response: Assessed pt current functioning per pt report.  Processed w/ pt stressors of home, work and interactions.  Explored w/pt how she is seeking self care.  Discussed her role in protecting children and how similar when she was growing up.  Plan: Return again in 2 weeks, via webex.  F/u as scheduled w/ Dr Adele Schilder.  Diagnosis:  Bipolar 1 d/o.  Christina [name] [facility] 05/30/2019

## 2019-05-30 NOTE — Patient Instructions (Signed)
F/u in office with mD in 5 months, call if you need me sooner  Good foot exam today, I do recommend you have Podiatry cut your nails, you are cutting them too low, safer to have Podiatry take care of toenails  Pneumonia 23 today   Congrats on improved blood sugar and overall health  Labs this week fasting microalb, CBC, iron, vit B 12, lipid, cmp and EGFR , TSH and vit D  It is important that you exercise regularly at least 30 minutes 5 times a week. If you develop chest pain, have severe difficulty breathing, or feel very tired, stop exercising immediately and seek medical attention  Think about what you will eat, plan ahead. Choose " clean, green, fresh or frozen" over canned, processed or packaged foods which are more sugary, salty and fatty. 70 to 75% of food eaten should be vegetables and fruit. Three meals at set times with snacks allowed between meals, but they must be fruit or vegetables. Aim to eat over a 12 hour period , example 7 am to 7 pm, and STOP after  your last meal of the day. Drink water,generally about 64 ounces per day, no other drink is as healthy. Fruit juice is best enjoyed in a healthy way, by EATING the fruit. Thanks for choosing [facility], we consider it a privelige to serve you.

## 2019-06-02 DIAGNOSIS — I10 Essential (primary) hypertension: Secondary | ICD-10-CM | POA: Diagnosis not present

## 2019-06-02 DIAGNOSIS — E669 Obesity, unspecified: Secondary | ICD-10-CM | POA: Diagnosis not present

## 2019-06-02 DIAGNOSIS — E1169 Type 2 diabetes mellitus with other specified complication: Secondary | ICD-10-CM | POA: Diagnosis not present

## 2019-06-02 DIAGNOSIS — D508 Other iron deficiency anemias: Secondary | ICD-10-CM | POA: Diagnosis not present

## 2019-06-03 ENCOUNTER — Other Ambulatory Visit: Payer: Self-pay | Admitting: Family Medicine

## 2019-06-03 ENCOUNTER — Encounter: Payer: Self-pay | Admitting: Family Medicine

## 2019-06-03 MED ORDER — ROSUVASTATIN CALCIUM 5 MG PO TABS: ORAL_TABLET | ORAL | 3 refills | Status: DC

## 2019-06-03 NOTE — Progress Notes (Signed)
[facility] 5  

## 2019-06-04 ENCOUNTER — Encounter: Payer: Self-pay | Admitting: Family Medicine

## 2019-06-04 LAB — CMP14+EGFR
ALT: 10 IU/L (ref 0–32)
AST: 13 IU/L (ref 0–40)
Albumin/Globulin Ratio: 1.8 (ref 1.2–2.2)
Albumin: 4.8 g/dL (ref 3.8–4.8)
Alkaline Phosphatase: 73 IU/L (ref 39–117)
BUN/Creatinine Ratio: 9 (ref 9–23)
BUN: 10 mg/dL (ref 6–24)
Bilirubin Total: 0.2 mg/dL (ref 0.0–1.2)
CO2: 26 mmol/L (ref 20–29)
Calcium: 10 mg/dL (ref 8.7–10.2)
Chloride: 99 mmol/L (ref 96–106)
Creatinine, Ser: 1.11 mg/dL — ABNORMAL HIGH (ref 0.57–1.00)
GFR calc Af Amer: 70 mL/min/{1.73_m2} (ref >59)
GFR calc non Af Amer: 61 mL/min/{1.73_m2} (ref >59)
Globulin, Total: 2.6 g/dL (ref 1.5–4.5)
Glucose: 161 mg/dL — ABNORMAL HIGH (ref 65–99)
Potassium: 4.2 mmol/L (ref 3.5–5.2)
Sodium: 138 mmol/L (ref 134–144)
Total Protein: 7.4 g/dL (ref 6.0–8.5)

## 2019-06-04 LAB — CBC WITH DIFFERENTIAL/PLATELET
Basophils Absolute: 0 10*3/uL (ref 0.0–0.2)
Basos: 1 %
EOS (ABSOLUTE): 0.1 10*3/uL (ref 0.0–0.4)
Eos: 2 %
Hematocrit: 36.7 % (ref 34.0–46.6)
Hemoglobin: 12.4 g/dL (ref 11.1–15.9)
Immature Grans (Abs): 0 10*3/uL (ref 0.0–0.1)
Immature Granulocytes: 0 %
Lymphocytes Absolute: 2.7 10*3/uL (ref 0.7–3.1)
Lymphs: 47 %
MCH: 29.5 pg (ref 26.6–33.0)
MCHC: 33.8 g/dL (ref 31.5–35.7)
MCV: 87 fL (ref 79–97)
Monocytes Absolute: 0.4 10*3/uL (ref 0.1–0.9)
Monocytes: 8 %
Neutrophils Absolute: 2.3 10*3/uL (ref 1.4–7.0)
Neutrophils: 42 %
Platelets: 368 10*3/uL (ref 150–450)
RBC: 4.2 x10E6/uL (ref 3.77–5.28)
RDW: 12.4 % (ref 11.7–15.4)
WBC: 5.5 10*3/uL (ref 3.4–10.8)

## 2019-06-04 LAB — LIPID PANEL WITH LDL/HDL RATIO
Cholesterol, Total: 157 mg/dL (ref 100–199)
HDL: 58 mg/dL (ref >39)
LDL Chol Calc (NIH): 78 mg/dL (ref 0–99)
LDL/HDL Ratio: 1.3 ratio (ref 0.0–3.2)
Triglycerides: 118 mg/dL (ref 0–149)
VLDL Cholesterol Cal: 21 mg/dL (ref 5–40)

## 2019-06-04 LAB — MICROALBUMIN / CREATININE URINE RATIO
Creatinine, Urine: 101.2 mg/dL
Microalb/Creat Ratio: 42 mg/g creat — ABNORMAL HIGH (ref 0–29)
Microalbumin, Urine: 42.9 ug/mL

## 2019-06-04 LAB — VITAMIN B12: Vitamin B-12: 368 pg/mL (ref 232–1245)

## 2019-06-04 LAB — HEMOGLOBIN A1C
Est. average glucose Bld gHb Est-mCnc: 169 mg/dL
Hgb A1c MFr Bld: 7.5 % — ABNORMAL HIGH (ref 4.8–5.6)

## 2019-06-04 LAB — IRON: Iron: 62 ug/dL (ref 27–159)

## 2019-06-04 LAB — VITAMIN D 25 HYDROXY (VIT D DEFICIENCY, FRACTURES): Vit D, 25-Hydroxy: 31 ng/mL (ref 30.0–100.0)

## 2019-06-04 NOTE — Assessment & Plan Note (Signed)
Improved, pt followed by Endo, medication adjustment per Endo. Christina [name] is reminded of the importance of commitment to daily physical activity for 30 minutes or more, as able and the need to limit carbohydrate intake to 30 to 60 grams per meal to help with blood sugar control.   The need to take medication as prescribed, test blood sugar as directed, and to call between visits if there is a concern that blood sugar is uncontrolled is also discussed.   Christina [name] is reminded of the importance of daily foot exam, annual eye examination, and good blood sugar, blood pressure and cholesterol control.  Diabetic Labs Latest Ref Rng & Units 06/02/2019 02/01/2019 11/25/2018 04/27/2018 03/11/2018  HbA1c 4.8 - 5.6 % 7.5(H) - 9.6(H) 8.9(H) -  Microalbumin Not Estab. ug/mL - - - - -  Micro/Creat Ratio 0.0 - 30.0 mg/g creat - - - - -  Chol 100 - 199 mg/dL 157 - - - -  HDL >39 mg/dL 58 - - - -  Calc LDL 0 - 99 mg/dL 78 - - - -  Triglycerides 0 - 149 mg/dL 118 - - - -  Creatinine 0.57 - 1.00 mg/dL 1.11(H) 0.85 0.83 0.90 0.80   BP/Weight 05/30/2019 03/22/2019 03/13/2019 01/31/2019 11/23/2018 05/24/2018 123456  Systolic BP [id number] Q000111Q A999333 Q000111Q [id number]  Diastolic BP 84 87 64 74 80 81 79  Wt. (Lbs) 231 237.8 237 237 240 234 235  BMI 39.04 40.19 40.68 40.68 41.2 39.55 39.71  Some encounter information is confidential and restricted. Go to Review Flowsheets activity to see all data.   Foot/eye exam completion dates Latest Ref Rng & Units 05/30/2019 12/29/2017  Eye Exam No Retinopathy - No Retinopathy  Foot Form Completion - Done -

## 2019-06-04 NOTE — Assessment & Plan Note (Signed)
Oesity linked with hypertension and diabetes  Patient re-educated about  the importance of commitment to a  minimum of 150 minutes of exercise per week as able.  The importance of healthy food choices with portion control discussed, as well as eating regularly and within a 12 hour window most days. The need to choose "clean , green" food 50 to 75% of the time is discussed, as well as to make water the primary drink and set a goal of 64 ounces water daily.    Weight /BMI 05/30/2019 03/22/2019 03/13/2019  WEIGHT 231 lb 237 lb 12.8 oz 237 lb  HEIGHT 5' 4.5" 5' 4.5" 5\' 4"   BMI 39.04 kg/m2 40.19 kg/m2 40.68 kg/m2  Some encounter information is confidential and restricted. Go to Review Flowsheets activity to see all data.

## 2019-06-04 NOTE — Assessment & Plan Note (Signed)
Controlled, no change in medication DASH diet and commitment to daily physical activity for a minimum of 30 minutes discussed and encouraged, as a part of hypertension management. The importance of attaining a healthy weight is also discussed.  BP/Weight 05/30/2019 03/22/2019 03/13/2019 01/31/2019 11/23/2018 05/24/2018 123456  Systolic BP [id number] Q000111Q A999333 Q000111Q 123456 [id number]  Diastolic BP 84 87 64 74 80 81 79  Wt. (Lbs) 231 237.8 237 237 240 234 235  BMI 39.04 40.19 40.68 40.68 41.2 39.55 39.71  Some encounter information is confidential and restricted. Go to Review Flowsheets activity to see all data.

## 2019-06-04 NOTE — Progress Notes (Signed)
Christina [name]     MRN: [id number]      DOB: [birth year]   HPI Christina [name] is here for follow up and re-evaluation of chronic medical conditions, medication management and review of any available recent lab and radiology data.  Preventive health is updated, specifically  Cancer screening and Immunization.   Questions or concerns regarding consultations or procedures which the PT has had in the interim are  addressed. The PT denies any adverse reactions to current medications since the last visit.  There are no new concerns.  There are no specific complaints  Denies polyuria, polydipsia, blurred vision , or hypoglycemic episodes. Reports improved blood sugar control and is happy about weight loss  ROS Denies recent fever or chills. Denies sinus pressure, nasal congestion, ear pain or sore throat. Denies chest congestion, productive cough or wheezing. Denies chest pains, palpitations and leg swelling Denies abdominal pain, nausea, vomiting,diarrhea or constipation.   Denies dysuria, frequency, hesitancy or incontinence. Denies joint pain, swelling and limitation in mobility. Denies headaches, seizures, numbness, or tingling. Denies uncontrolled depression, anxiety or insomnia. Denies skin break down or rash.   PE  BP 130/84   Pulse 96   Temp (!) 97.4 F (36.3 C) (Temporal)   Resp 15   Ht 5' 4.5" (1.638 m)   Wt 231 lb (104.8 kg)   SpO2 97%   BMI 39.04 kg/m   Patient alert and oriented and in no cardiopulmonary distress.  HEENT: No facial asymmetry, EOMI,     Neck supple .  Chest: Clear to auscultation bilaterally.  CVS: S1, S2 no murmurs, no S3.Regular rate.  ABD: Soft non tender.   Ext: No edema  MS: Adequate ROM spine, shoulders, hips and knees.  Skin: Intact, no ulcerations or rash noted.  Psych: Good eye contact, normal affect. Memory intact not anxious or depressed appearing.  CNS: CN 2-12 intact, power,  normal throughout.no focal deficits noted.    Assessment & Plan  Essential hypertension, benign Controlled, no change in medication DASH diet and commitment to daily physical activity for a minimum of 30 minutes discussed and encouraged, as a part of hypertension management. The importance of attaining a healthy weight is also discussed.  BP/Weight 05/30/2019 03/22/2019 03/13/2019 01/31/2019 11/23/2018 05/24/2018 123456  Systolic BP [id number] Q000111Q A999333 Q000111Q 123456 [id number]  Diastolic BP 84 87 64 74 80 81 79  Wt. (Lbs) 231 237.8 237 237 240 234 235  BMI 39.04 40.19 40.68 40.68 41.2 39.55 39.71  Some encounter information is confidential and restricted. Go to Review Flowsheets activity to see all data.       Type 2 diabetes mellitus with vascular disease ([place]) Improved, pt followed by Endo, medication adjustment per Endo. Christina [name] is reminded of the importance of commitment to daily physical activity for 30 minutes or more, as able and the need to limit carbohydrate intake to 30 to 60 grams per meal to help with blood sugar control.   The need to take medication as prescribed, test blood sugar as directed, and to call between visits if there is a concern that blood sugar is uncontrolled is also discussed.   Christina [name] is reminded of the importance of daily foot exam, annual eye examination, and good blood sugar, blood pressure and cholesterol control.  Diabetic Labs Latest Ref Rng & Units 06/02/2019 02/01/2019 11/25/2018 04/27/2018 03/11/2018  HbA1c 4.8 - 5.6 % 7.5(H) - 9.6(H) 8.9(H) -  Microalbumin Not Estab. ug/mL - - - - -  Micro/Creat Ratio 0.0 - 30.0 mg/g creat - - - - -  Chol 100 - 199 mg/dL 157 - - - -  HDL >39 mg/dL 58 - - - -  Calc LDL 0 - 99 mg/dL 78 - - - -  Triglycerides 0 - 149 mg/dL 118 - - - -  Creatinine 0.57 - 1.00 mg/dL 1.11(H) 0.85 0.83 0.90 0.80   BP/Weight 05/30/2019 03/22/2019 03/13/2019 01/31/2019 11/23/2018 05/24/2018 123456  Systolic BP [id number] Q000111Q A999333 Q000111Q 123456 [id number]  Diastolic BP 84 87 64 74 80 81 79  Wt. (Lbs) 231 237.8  237 237 240 234 235  BMI 39.04 40.19 40.68 40.68 41.2 39.55 39.71  Some encounter information is confidential and restricted. Go to Review Flowsheets activity to see all data.   Foot/eye exam completion dates Latest Ref Rng & Units 05/30/2019 12/29/2017  Eye Exam No Retinopathy - No Retinopathy  Foot Form Completion - Done -        Morbid obesity Oesity linked with hypertension and diabetes  Patient re-educated about  the importance of commitment to a  minimum of 150 minutes of exercise per week as able.  The importance of healthy food choices with portion control discussed, as well as eating regularly and within a 12 hour window most days. The need to choose "clean , green" food 50 to 75% of the time is discussed, as well as to make water the primary drink and set a goal of 64 ounces water daily.    Weight /BMI 05/30/2019 03/22/2019 03/13/2019  WEIGHT 231 lb 237 lb 12.8 oz 237 lb  HEIGHT 5' 4.5" 5' 4.5" 5\' 4"   BMI 39.04 kg/m2 40.19 kg/m2 40.68 kg/m2  Some encounter information is confidential and restricted. Go to Review Flowsheets activity to see all data.      Bipolar 1 disorder, mixed, moderate ([place]) Controlled and managed by Psychiatry

## 2019-06-04 NOTE — Assessment & Plan Note (Signed)
Controlled and managed by Psychiatry 

## 2019-06-08 ENCOUNTER — Encounter: Payer: Self-pay | Admitting: Family Medicine

## 2019-06-08 NOTE — Telephone Encounter (Signed)
I am not familiar with her recent procedures. But, an in person appt is needed for any test ordering and possible referrals if needed. Please see if there is an opening next week on either schedule.

## 2019-06-13 ENCOUNTER — Encounter: Payer: Self-pay | Admitting: Family Medicine

## 2019-06-13 ENCOUNTER — Ambulatory Visit (INDEPENDENT_AMBULATORY_CARE_PROVIDER_SITE_OTHER): Payer: BC Managed Care – PPO | Admitting: Family Medicine

## 2019-06-13 ENCOUNTER — Ambulatory Visit (INDEPENDENT_AMBULATORY_CARE_PROVIDER_SITE_OTHER): Payer: BC Managed Care – PPO | Admitting: Psychology

## 2019-06-13 ENCOUNTER — Other Ambulatory Visit: Payer: Self-pay

## 2019-06-13 VITALS — BP 122/88 | HR 87 | Temp 98.0°F | Ht 65.0 in | Wt 232.0 lb

## 2019-06-13 DIAGNOSIS — R1032 Left lower quadrant pain: Secondary | ICD-10-CM

## 2019-06-13 DIAGNOSIS — F3162 Bipolar disorder, current episode mixed, moderate: Secondary | ICD-10-CM

## 2019-06-13 DIAGNOSIS — E1159 Type 2 diabetes mellitus with other circulatory complications: Secondary | ICD-10-CM

## 2019-06-13 MED ORDER — METFORMIN HCL 1000 MG PO TABS: 1000.0000 mg | ORAL_TABLET | Freq: Two times a day (BID) | ORAL | 3 refills | Status: DC

## 2019-06-13 NOTE — Progress Notes (Signed)
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 Denies sinus pressure, nasal congestion, ear pain or sore throat. Denies chest congestion, productive cough or wheezing. Denies chest pains, palpitations and leg swelling  Denies dysuria, frequency, hesitancy or incontinence. Denies joint pain, swelling and limitation in mobility. Denies headaches, seizures, numbness, or tingling. Denies depression, anxiety or insomnia. Denies skin break down or rash.   PE  BP 122/88   Pulse 87   Temp 98 F (36.7 C) (Temporal)   Ht 5\' 5"  (1.651 m)   Wt 232 lb (105.2 kg)   SpO2 97%   BMI 38.61 kg/m   Patient alert and oriented and in no cardiopulmonary distress.  HEENT: No facial asymmetry, EOMI,     Neck supple .  Chest: Clear to auscultation bilaterally.  CVS: S1, S2 no murmurs, no S3.Regular rate.  ABD: Soft.Mildly  tender over LLQ to palpation. Incision scare well healed wit no erythema or drainage  Ext: No edema  MS: Adequate ROM spine, shoulders, hips and knees.  Skin: Intact, no ulcerations or rash noted.  Psych: Good eye contact, normal affect. Memory intact not anxious or depressed appearing.  CNS: CN 2-12 intact, power,  normal throughout.no focal deficits noted.   Assessment & Plan  Abdominal pain 5 6 month h/o worsening LLQ pain following  Umbilical hernia repair complicated by post op  sepsis  Type 2 diabetes mellitus with vascular disease ([place]) Christina [name] is reminded of the importance of commitment to daily physical activity for 30 minutes or more, as able and the need to limit carbohydrate intake to 30 to 60 grams per meal to help with blood sugar control.   The need to take medication as prescribed, test blood sugar as directed, and to call between visits if there is a concern that blood sugar is uncontrolled is also discussed.   Christina [name] is reminded of the importance of daily foot exam, annual eye examination, and good blood sugar, blood pressure and cholesterol control.  Diabetic Labs Latest Ref Rng & Units 06/02/2019 02/01/2019 11/25/2018 04/27/2018 03/11/2018  HbA1c 4.8 - 5.6 % 7.5(H) - 9.6(H) 8.9(H) -  Microalbumin Not Estab. ug/mL - - - - -  Micro/Creat Ratio 0.0 - 30.0 mg/g creat - - - - -  Chol 100 - 199 mg/dL 157 - - - -  HDL >39 mg/dL 58 - - - -  Calc LDL 0 - 99 mg/dL 78 - - - -  Triglycerides 0 - 149 mg/dL 118 - - - -  Creatinine 0.57 - 1.00 mg/dL 1.11(H) 0.85 0.83 0.90 0.80   BP/Weight 06/13/2019 05/30/2019 03/22/2019 03/13/2019 01/31/2019 11/23/2018 Q000111Q  Systolic BP [id number] Q000111Q A999333 Q000111Q [id number]  Diastolic BP 88 84 87 64 74 80 81  Wt. (Lbs) 232 231 237.8 237 237 240 234  BMI 38.61 39.04 40.19 40.68  40.68 41.2 39.55  Some encounter information is confidential and restricted. Go to Review Flowsheets activity to see all data.   Foot/eye exam completion dates Latest Ref Rng & Units 05/30/2019 12/29/2017  Eye Exam No Retinopathy - No Retinopathy  Foot Form Completion - Done -   Marked improvement, treated byu endo, metformin refilled per pt request     Bipolar 1 disorder, mixed, moderate ([facility]) Controlled, no change in medication

## 2019-06-13 NOTE — Assessment & Plan Note (Signed)
5 6 month h/o worsening LLQ pain following  Umbilical hernia repair complicated by post op sepsis

## 2019-06-13 NOTE — Progress Notes (Signed)
    Virtual Visit via Telephone Note  I connected with Christina [name] on 06/13/19 at 12:30 PM EST by telephone and verified that I am speaking with the correct person using two identifiers.   I discussed the limitations, risks, security and privacy concerns of performing an evaluation and management service by telephone and the availability of in person appointments. I also discussed with the patient that there may be a patient responsible charge related to this service. The patient expressed understanding and agreed to proceed.    I discussed the assessment and treatment plan with the patient. The patient was provided an opportunity to ask questions and all were answered. The patient agreed with the plan and demonstrated an understanding of the instructions.   The patient was advised to call back or seek an in-person evaluation if the symptoms worsen or if the condition fails to improve as anticipated.  I provided 49 minutes of non-face-to-face time during this encounter.   [name] [facility]     THERAPIST PROGRESS NOTE  Session Time: 12.30pm-1.19pm  Participation Level: Active  Behavioral Response: Well GroomedAlertaffect wnl  Type of Therapy: Individual Therapy  Treatment Goals addressed: Diagnosis: Bipolar 1 d/o and goal 1.  Interventions: CBT, Strength-based and Supportive  Summary: Christina [name] is a 43 y.o. female who presents with affect wnl.  pt reported that has one class left complete for the semester- has wrapped up assignments.  Pt  Feels good about semester and ability to express concerns and assert in group project problem.  pt reported that she is f/u w/ her PCP re: some tenderness in abdomen.  Pt reported that hasn't talked w/her husband re: concers of drinking and exploring ways in which to approach.  Pt gave consideration and discussed that she would like for him to f/u w/ counseling individually.    Suicidal/Homicidal: Nowithout intent/plan  Therapist  Response: Assessed pt current functioning per pt report.  Processed w/pt stressors.  Reflected use of assertiveness and engaging instead of avoiding.  Explored w/pt use of communication w/ husband to address concerns.    Plan: Return again in 2 weeks, via webex.  Pt to f/u as scheduled w/ [name].  Diagnosis:  Bipolar 1 d/o    [name] [facility] 06/13/2019

## 2019-06-13 NOTE — Patient Instructions (Addendum)
F/U as before, call if you need me sooner  Metformin is prescribed for 1 year, please discuss with Endo at your visit  You are being referred to Surgery in [place] for eval of  abdominal pain, if unable to evaluate you, I will refer you to Dr Rogers Seeds

## 2019-06-14 ENCOUNTER — Encounter: Payer: Self-pay | Admitting: Family Medicine

## 2019-06-14 NOTE — Assessment & Plan Note (Signed)
Christina [name] is reminded of the importance of commitment to daily physical activity for 30 minutes or more, as able and the need to limit carbohydrate intake to 30 to 60 grams per meal to help with blood sugar control.   The need to take medication as prescribed, test blood sugar as directed, and to call between visits if there is a concern that blood sugar is uncontrolled is also discussed.   Christina [name] is reminded of the importance of daily foot exam, annual eye examination, and good blood sugar, blood pressure and cholesterol control.  Diabetic Labs Latest Ref Rng & Units 06/02/2019 02/01/2019 11/25/2018 04/27/2018 03/11/2018  HbA1c 4.8 - 5.6 % 7.5(H) - 9.6(H) 8.9(H) -  Microalbumin Not Estab. ug/mL - - - - -  Micro/Creat Ratio 0.0 - 30.0 mg/g creat - - - - -  Chol 100 - 199 mg/dL 157 - - - -  HDL >39 mg/dL 58 - - - -  Calc LDL 0 - 99 mg/dL 78 - - - -  Triglycerides 0 - 149 mg/dL 118 - - - -  Creatinine 0.57 - 1.00 mg/dL 1.11(H) 0.85 0.83 0.90 0.80   BP/Weight 06/13/2019 05/30/2019 03/22/2019 03/13/2019 01/31/2019 11/23/2018 [id number]  Systolic BP [id number] [id number]  Diastolic BP 88 84 87 64 74 80 81  Wt. (Lbs) 232 231 237.8 237 237 240 234  BMI 38.61 39.04 40.19 40.68 40.68 41.2 39.55  Some encounter information is confidential and restricted. Go to Review Flowsheets activity to see all data.   Foot/eye exam completion dates Latest Ref Rng & Units 05/30/2019 12/29/2017  Eye Exam No Retinopathy - No Retinopathy  Foot Form Completion - Done -   Marked improvement, treated byu endo, metformin refilled per pt request

## 2019-06-14 NOTE — Assessment & Plan Note (Signed)
Controlled, no change in medication  

## 2019-06-27 ENCOUNTER — Other Ambulatory Visit: Payer: Self-pay

## 2019-06-27 ENCOUNTER — Ambulatory Visit (INDEPENDENT_AMBULATORY_CARE_PROVIDER_SITE_OTHER): Payer: BC Managed Care – PPO | Admitting: Psychology

## 2019-06-27 DIAGNOSIS — F3162 Bipolar disorder, current episode mixed, moderate: Secondary | ICD-10-CM

## 2019-06-27 DIAGNOSIS — I10 Essential (primary) hypertension: Secondary | ICD-10-CM | POA: Diagnosis not present

## 2019-06-27 DIAGNOSIS — E1165 Type 2 diabetes mellitus with hyperglycemia: Secondary | ICD-10-CM | POA: Diagnosis not present

## 2019-06-27 DIAGNOSIS — D3501 Benign neoplasm of right adrenal gland: Secondary | ICD-10-CM | POA: Diagnosis not present

## 2019-06-27 DIAGNOSIS — E785 Hyperlipidemia, unspecified: Secondary | ICD-10-CM | POA: Diagnosis not present
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
## 2019-07-03 ENCOUNTER — Encounter: Payer: Self-pay | Admitting: Family Medicine

## 2019-07-05 ENCOUNTER — Other Ambulatory Visit: Payer: Self-pay | Admitting: Family Medicine

## 2019-07-05 MED ORDER — LOSARTAN POTASSIUM 50 MG PO TABS: 50.0000 mg | ORAL_TABLET | Freq: Every day | ORAL | 3 refills | Status: DC

## 2019-07-05 NOTE — Progress Notes (Signed)
  change to Christina [name]

## 2019-07-11 ENCOUNTER — Ambulatory Visit (HOSPITAL_COMMUNITY): Payer: BC Managed Care – PPO | Admitting: Psychology

## 2019-07-12 ENCOUNTER — Ambulatory Visit (HOSPITAL_COMMUNITY): Payer: BC Managed Care – PPO | Admitting: Psychiatry

## 2019-07-25 ENCOUNTER — Ambulatory Visit (INDEPENDENT_AMBULATORY_CARE_PROVIDER_SITE_OTHER): Payer: BC Managed Care – PPO | Admitting: Psychology

## 2019-07-25 ENCOUNTER — Other Ambulatory Visit: Payer: Self-pay

## 2019-07-25 DIAGNOSIS — F3162 Bipolar disorder, current episode mixed, moderate: Secondary | ICD-10-CM
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
## 2019-07-26 ENCOUNTER — Ambulatory Visit (HOSPITAL_COMMUNITY): Payer: BC Managed Care – PPO | Admitting: Psychiatry

## 2019-08-02 ENCOUNTER — Encounter (HOSPITAL_COMMUNITY): Payer: Self-pay | Admitting: Psychiatry

## 2019-08-02 ENCOUNTER — Other Ambulatory Visit: Payer: Self-pay

## 2019-08-02 ENCOUNTER — Ambulatory Visit (INDEPENDENT_AMBULATORY_CARE_PROVIDER_SITE_OTHER): Payer: BC Managed Care – PPO | Admitting: Psychiatry

## 2019-08-02 DIAGNOSIS — F3162 Bipolar disorder, current episode mixed, moderate: Secondary | ICD-10-CM | POA: Diagnosis not present

## 2019-08-02 DIAGNOSIS — F411 Generalized anxiety disorder: Secondary | ICD-10-CM

## 2019-08-02 MED ORDER — ARIPIPRAZOLE 5 MG PO TABS: 5.0000 mg | ORAL_TABLET | Freq: Every evening | ORAL | 0 refills | Status: DC

## 2019-08-02 NOTE — Progress Notes (Signed)
Virtual Visit via Telephone Note  I connected with Christina [name] on 08/02/19 at 11:00 AM EST by telephone and verified that I am speaking with the correct person using two identifiers.   I discussed the limitations, risks, security and privacy concerns of performing an evaluation and management service by telephone and the availability of in person appointments. I also discussed with the patient that there may be a patient responsible charge related to this service. The patient expressed understanding and agreed to proceed.   History of Present Illness: Patient was evaluated by phone session.  She feels the current medicine is working very well.  She denies any paranoia, hallucination, irritability, mood swing and anger.  Sleep is good.  Recently she had blood work and she was pleased that her hemoglobin A1c dropped to 7.5.  Patient had a quiet Christmas.  She had a good support from her husband and her in-laws.  Patient is concerned about her 57-year-old son who may have autism and she is in a process of getting the referral for speech therapy.  She is also in therapy with Forest Gleason.  She does not want to change medication since it is working.  She has no tremors, shakes, EPS.  She denies any nightmares or flashback.  Her anxiety is under control.    Past Psychiatric History:Reviewed. H/Odepression, anxiety, mania and psychosis. Admitted in [facility] at [facility] due to suicidal gestures. Seen inthis office since 2006. TriedProzac and Wellbutrin.    Recent Results (from the past 2160 hour(s))  Microalbumin / creatinine urine ratio     Status: Abnormal   Collection Time: 06/02/19 10:06 AM  Result Value Ref Range   Creatinine, Urine 101.2 Not Estab. mg/dL   Microalbumin, Urine 42.9 Not Estab. ug/mL   Microalb/Creat Ratio 42 (H) 0 - 29 mg/g creat    Comment:                        Normal:                0 -  29                        Moderately increased: 30 - 300                        Severely  increased:       >300   CBC with Differential/Platelet     Status: None   Collection Time: 06/02/19 10:06 AM  Result Value Ref Range   WBC 5.5 3.4 - 10.8 x10E3/uL   RBC 4.20 3.77 - 5.28 x10E6/uL   Hemoglobin 12.4 11.1 - 15.9 g/dL   Hematocrit 36.7 34.0 - 46.6 %   MCV 87 79 - 97 fL   MCH 29.5 26.6 - 33.0 pg   MCHC 33.8 31.5 - 35.7 g/dL   RDW 12.4 11.7 - 15.4 %   Platelets 368 150 - 450 x10E3/uL   Neutrophils 42 Not Estab. %   Lymphs 47 Not Estab. %   Monocytes 8 Not Estab. %   Eos 2 Not Estab. %   Basos 1 Not Estab. %   Neutrophils Absolute 2.3 1.4 - 7.0 x10E3/uL   Lymphocytes Absolute 2.7 0.7 - 3.1 x10E3/uL   Monocytes Absolute 0.4 0.1 - 0.9 x10E3/uL   EOS (ABSOLUTE) 0.1 0.0 - 0.4 x10E3/uL   Basophils Absolute 0.0 0.0 - 0.2 x10E3/uL   Immature Granulocytes  0 Not Estab. %   Immature Grans (Abs) 0.0 0.0 - 0.1 x10E3/uL  Iron     Status: None   Collection Time: 06/02/19 10:06 AM  Result Value Ref Range   Iron 62 27 - 159 ug/dL  B12     Status: None   Collection Time: 06/02/19 10:06 AM  Result Value Ref Range   Vitamin B-12 368 232 - 1,245 pg/mL  Hemoglobin A1c     Status: Abnormal   Collection Time: 06/02/19 10:06 AM  Result Value Ref Range   Hgb A1c MFr Bld 7.5 (H) 4.8 - 5.6 %    Comment:          Prediabetes: 5.7 - 6.4          Diabetes: >6.4          Glycemic control for adults with diabetes: <7.0    Est. average glucose Bld gHb Est-mCnc 169 mg/dL  CMP14+EGFR     Status: Abnormal   Collection Time: 06/02/19 10:06 AM  Result Value Ref Range   Glucose 161 (H) 65 - 99 mg/dL   BUN 10 6 - 24 mg/dL   Creatinine, Ser 1.11 (H) 0.57 - 1.00 mg/dL   GFR calc non Af Amer 61 >59 mL/min/1.73   GFR calc Af Amer 70 >59 mL/min/1.73   BUN/Creatinine Ratio 9 9 - 23   Sodium 138 134 - 144 mmol/L   Potassium 4.2 3.5 - 5.2 mmol/L   Chloride 99 96 - 106 mmol/L   CO2 26 20 - 29 mmol/L   Calcium 10.0 8.7 - 10.2 mg/dL   Total Protein 7.4 6.0 - 8.5 g/dL   Albumin 4.8 3.8 - 4.8 g/dL    Globulin, Total 2.6 1.5 - 4.5 g/dL   Albumin/Globulin Ratio 1.8 1.2 - 2.2   Bilirubin Total 0.2 0.0 - 1.2 mg/dL   Alkaline Phosphatase 73 39 - 117 IU/L   AST 13 0 - 40 IU/L   ALT 10 0 - 32 IU/L  Lipid Panel With LDL/HDL Ratio     Status: None   Collection Time: 06/02/19 10:06 AM  Result Value Ref Range   Cholesterol, Total 157 100 - 199 mg/dL   Triglycerides 118 0 - 149 mg/dL   HDL 58 >39 mg/dL   VLDL Cholesterol Cal 21 5 - 40 mg/dL   LDL Chol Calc (NIH) 78 0 - 99 mg/dL   LDL/HDL Ratio 1.3 0.0 - 3.2 ratio    Comment:                                     LDL/HDL Ratio                                             Men  Women                               1/2 Avg.Risk  1.0    1.5                                   Avg.Risk  3.6    3.2  2X Avg.Risk  6.2    5.0                                3X Avg.Risk  8.0    6.1   VITAMIN D 25 Hydroxy (Vit-D Deficiency, Fractures)     Status: None   Collection Time: 06/02/19 10:06 AM  Result Value Ref Range   Vit D, 25-Hydroxy 31.0 30.0 - 100.0 ng/mL    Comment: Vitamin D deficiency has been defined by the [place] practice guideline as a level of serum 25-OH vitamin D less than 20 ng/mL (1,2). The Endocrine Society went on to further define vitamin D insufficiency as a level between 21 and 29 ng/mL (2). 1. IOM (Institute of Medicine). 2010. Dietary reference    intakes for calcium and D. [place]: The    [employer]. 2. Holick MF, Binkley [place], Bischoff-Ferrari HA, et al.    Evaluation, treatment, and prevention of vitamin D    deficiency: an Endocrine Society clinical practice    guideline. JCEM. 2011 Jul; 96(7):1911-30.      Psychiatric Specialty Exam: Physical Exam  Review of Systems  unknown if currently breastfeeding.There is no height or weight on file to calculate BMI.  General Appearance: NA  Eye Contact:  NA  Speech:  Clear and Coherent  Volume:   Normal  Mood:  Euthymic  Affect:  NA  Thought Process:  Goal Directed  Orientation:  Full (Time, Place, and Person)  Thought Content:  WDL  Suicidal Thoughts:  No  Homicidal Thoughts:  No  Memory:  Immediate;   Good Recent;   Good Remote;   Good  Judgement:  Good  Insight:  Good  Psychomotor Activity:  NA  Concentration:  Concentration: Good and Attention Span: Good  Recall:  Good  Fund of Knowledge:  Good  Language:  Good  Akathisia:  No  Handed:  Right  AIMS (if indicated):     Assets:  Communication Skills Desire for Improvement Housing Resilience Social Support Talents/Skills Transportation  ADL's:  Intact  Cognition:  WNL  Sleep:   ok     Assessment and Plan: Bipolar disorder type I.  Generalized anxiety disorder.  Patient is a stable on her current medication.  I review her blood work results.  Her hemoglobin A1c is improved.  Continue Abilify 5 mg daily.  Encouraged to continue therapy with [name].  Recommended to call us back if she has any question of any concern.  Follow-up in 3 months.  Follow Up Instructions:    I discussed the assessment and treatment plan with the patient. The patient was provided an opportunity to ask questions and all were answered. The patient agreed with the plan and demonstrated an understanding of the instructions.   The patient was advised to call back or seek an in-person evaluation if the symptoms worsen or if the condition fails to improve as anticipated.  I provided 15 minutes of non-face-to-face time during this encounter.   Kathlee Nations, MD

## 2019-08-15 ENCOUNTER — Ambulatory Visit (INDEPENDENT_AMBULATORY_CARE_PROVIDER_SITE_OTHER): Payer: BC Managed Care – PPO | Admitting: Psychology

## 2019-08-15 ENCOUNTER — Ambulatory Visit (INDEPENDENT_AMBULATORY_CARE_PROVIDER_SITE_OTHER): Payer: BC Managed Care – PPO | Admitting: Family Medicine

## 2019-08-15 ENCOUNTER — Other Ambulatory Visit: Payer: Self-pay

## 2019-08-15 ENCOUNTER — Encounter: Payer: Self-pay | Admitting: Family Medicine

## 2019-08-15 VITALS — BP 124/84 | HR 81 | Temp 98.5°F | Resp 15 | Ht 65.0 in | Wt 239.4 lb

## 2019-08-15 DIAGNOSIS — F3162 Bipolar disorder, current episode mixed, moderate: Secondary | ICD-10-CM | POA: Diagnosis not present

## 2019-08-15 DIAGNOSIS — I10 Essential (primary) hypertension: Secondary | ICD-10-CM | POA: Diagnosis not present

## 2019-08-15 DIAGNOSIS — E1159 Type 2 diabetes mellitus with other circulatory complications: Secondary | ICD-10-CM

## 2019-08-15 MED ORDER — LOSARTAN POTASSIUM 50 MG PO TABS: 50.0000 mg | ORAL_TABLET | Freq: Every day | ORAL | 3 refills | Status: DC

## 2019-08-15 NOTE — Progress Notes (Signed)
Virtual Visit via Video Note  I connected with Christina [name] on 08/15/19 at 11:00 AM EST by a video, video not working today, enabled telemedicine application and verified that I am speaking with the correct person using two identifiers.   I discussed the limitations of evaluation and management by telemedicine and the availability of in person appointments. The patient expressed understanding and agreed to proceed.    I discussed the assessment and treatment plan with the patient. The patient was provided an opportunity to ask questions and all were answered. The patient agreed with the plan and demonstrated an understanding of the instructions.   The patient was advised to call back or seek an in-person evaluation if the symptoms worsen or if the condition fails to improve as anticipated.  I provided 53 minutes of non-face-to-face time during this encounter.   [name] [facility]    THERAPIST PROGRESS NOTE  Session Time: 11.02am-11.55am  Participation Level: Active  Behavioral Response: NAAlertaffect wnl  Type of Therapy: Individual Therapy  Treatment Goals addressed: Diagnosis: Bipolar 1 d/o and goal 1.  Interventions: CBT and Strength-based  Summary: Christina [name] is a 44 y.o. female who presents with affect wnl.  pt reported that they had a good newyears and interactions have been positive at home.  Pt reported that she is having difficulty w/ her connection today and so didn't use video portion.  Pt reported that her dad is concerned that mom's dealing w/ alzhimers not just effects of 2018 stroke. Pt discussed that dad also preparing for dialysis and stressors of supporting her parents w/ aging and health needs.  Pt discussed that if mom dealing w/ issues of alzheimer will initiate her to seek sorority that mom was part of.  Pt aware of this would be stressful for her to do currently and important for her to identify what's important and whether more info needed re: mom's health  to make decision.  Pt discussed continued impact of protecting husband's struggles from other's.  Pt did identify that plans to share w/her son's therapist.  .   Suicidal/Homicidal: Nowithout intent/plan  Therapist Response: Assessed pt current functioning per pt report.  Processed w/pt stressors of parents health and relationship issues -impact having on her.  Gave pt space to explore her response to these things and whether seeking change or maintain.  Plan: Return again in 2 weeks, via webex.  Pt to f/u as scheduled w/ [name].  Diagnosis: Bipolar 1 d/o   [name] [facility] 08/15/2019

## 2019-08-15 NOTE — Progress Notes (Signed)
Christina [name]     MRN: [id number]      DOB: [birth year]   HPI Christina [name] is here for follow up and re-evaluation of chronic medical conditions in particular re evaluation of blood pressure with recent change in medication, medication management and review of any available recent lab and radiology data.  Preventive health is updated, specifically  Cancer screening and Immunization.   Questions or concerns regarding consultations or procedures which the PT has had in the interim are  addressed. The PT denies any adverse reactions to current medications since the last visit.  There are no new concerns.  There are no specific complaints  Denies polyuria, polydipsia, blurred vision , or hypoglycemic episodes.   ROS Denies recent fever or chills. Denies sinus pressure, nasal congestion, ear pain or sore throat. Denies chest congestion, productive cough or wheezing. Denies chest pains, palpitations and leg swelling Denies abdominal pain, nausea, vomiting,diarrhea or constipation.   Denies dysuria, frequency, hesitancy or incontinence. Denies joint pain, swelling and limitation in mobility. Denies headaches, seizures, numbness, or tingling. Denies uncontrolled  depression, anxiety or insomnia. Denies skin break down or rash.   PE  BP 124/84   Pulse 81   Temp 98.5 F (36.9 C) (Temporal)   Resp 15   Ht 5\' 5"  (1.651 m)   Wt 239 lb 6.4 oz (108.6 kg)   SpO2 98%   BMI 39.84 kg/m   Patient alert and oriented and in no cardiopulmonary distress.  HEENT: No facial asymmetry, EOMI,     Neck supple .  Chest: Clear to auscultation bilaterally.  CVS: S1, S2 no murmurs, no S3.Regular rate.  ABD: Soft non tender.   Ext: No edema  MS: Adequate ROM spine, shoulders, hips and knees.  Skin: Intact, no ulcerations or rash noted.  Psych: Good eye contact, normal affect. Memory intact not anxious or depressed appearing.  CNS: CN 2-12 intact, power,  normal throughout.no focal deficits  noted.   Assessment & Plan  Essential hypertension, benign Controlled, no change in medication DASH diet and commitment to daily physical activity for a minimum of 30 minutes discussed and encouraged, as a part of hypertension management. The importance of attaining a healthy weight is also discussed.  BP/Weight 08/15/2019 06/13/2019 05/30/2019 03/22/2019 03/13/2019 01/31/2019 [id number]  Systolic BP A999333 [id number] [id number] A999333 Q000111Q [id number]  Diastolic BP 84 88 84 87 64 74 80  Wt. (Lbs) 239.4 232 231 237.8 237 237 240  BMI 39.84 38.61 39.04 40.19 40.68 40.68 41.2  Some encounter information is confidential and restricted. Go to Review Flowsheets activity to see all data.       Type 2 diabetes mellitus with vascular disease (HCC) Controlled, no change in medication Christina [name] is reminded of the importance of commitment to daily physical activity for 30 minutes or more, as able and the need to limit carbohydrate intake to 30 to 60 grams per meal to help with blood sugar control.   The need to take medication as prescribed, test blood sugar as directed, and to call between visits if there is a concern that blood sugar is uncontrolled is also discussed.   Christina [name] is reminded of the importance of daily foot exam, annual eye examination, and good blood sugar, blood pressure and cholesterol control.  Diabetic Labs Latest Ref Rng & Units 06/02/2019 02/01/2019 11/25/2018 04/27/2018 03/11/2018  HbA1c 4.8 - 5.6 % 7.5(H) - 9.6(H) 8.9(H) -  Microalbumin Not Estab. ug/mL - - - - -  Micro/Creat Ratio 0 - 29 mg/g creat 42(H) - - - -  Chol 100 - 199 mg/dL 157 - - - -  HDL >39 mg/dL 58 - - - -  Calc LDL 0 - 99 mg/dL 78 - - - -  Triglycerides 0 - 149 mg/dL 118 - - - -  Creatinine 0.57 - 1.00 mg/dL 1.11(H) 0.85 0.83 0.90 0.80   BP/Weight 08/15/2019 06/13/2019 05/30/2019 03/22/2019 03/13/2019 01/31/2019 [id number]  Systolic BP A999333 [id number] Q000111Q A999333 Q000111Q 123456  Diastolic BP 84 88 84 87 64 74 80  Wt. (Lbs) 239.4 232 231 237.8 237  237 240  BMI 39.84 38.61 39.04 40.19 40.68 40.68 41.2  Some encounter information is confidential and restricted. Go to Review Flowsheets activity to see all data.   Foot/eye exam completion dates Latest Ref Rng & Units 05/30/2019 12/29/2017  Eye Exam No Retinopathy - No Retinopathy  Foot Form Completion - Done -        Bipolar 1 disorder, mixed, moderate (HCC) Controlled, no change in medication   Morbid obesity Obesity linked with hypertension and diabetes  Patient re-educated about  the importance of commitment to a  minimum of 150 minutes of exercise per week as able.  The importance of healthy food choices with portion control discussed, as well as eating regularly and within a 12 hour window most days. The need to choose "clean , green" food 50 to 75% of the time is discussed, as well as to make water the primary drink and set a goal of 64 ounces water daily.    Weight /BMI 08/15/2019 06/13/2019 05/30/2019  WEIGHT 239 lb 6.4 oz 232 lb 231 lb  HEIGHT 5\' 5"  5\' 5"  5' 4.5"  BMI 39.84 kg/m2 38.61 kg/m2 39.04 kg/m2  Some encounter information is confidential and restricted. Go to Review Flowsheets activity to see all data.

## 2019-08-15 NOTE — Patient Instructions (Addendum)
F/U in 3.5  months, call if you need me before  Blood pressure is excellent , no med change  It is important that you exercise regularly at least 30 minutes 5 times a week. If you develop chest pain, have severe difficulty breathing, or feel very tired, stop exercising immediately and seek medical attention   Weight loss goal of 6 pounds       DASH Eating Plan DASH stands for "Dietary Approaches to Stop Hypertension." The DASH eating plan is a healthy eating plan that has been shown to reduce high blood pressure (hypertension). It may also reduce your risk for type 2 diabetes, heart disease, and stroke. The DASH eating plan may also help with weight loss. What are tips for following this plan?  General guidelines  Avoid eating more than 2,300 mg (milligrams) of salt (sodium) a day. If you have hypertension, you may need to reduce your sodium intake to 1,500 mg a day.  Limit alcohol intake to no more than 1 drink a day for nonpregnant women and 2 drinks a day for men. One drink equals 12 oz of beer, 5 oz of wine, or 1 oz of hard liquor.  Work with your health care provider to maintain a healthy body weight or to lose weight. Ask what an ideal weight is for you.  Get at least 30 minutes of exercise that causes your heart to beat faster (aerobic exercise) most days of the week. Activities may include walking, swimming, or biking.  Work with your health care provider or diet and nutrition specialist (dietitian) to adjust your eating plan to your individual calorie needs. Reading food labels   Check food labels for the amount of sodium per serving. Choose foods with less than 5 percent of the Daily Value of sodium. Generally, foods with less than 300 mg of sodium per serving fit into this eating plan.  To find whole grains, look for the word "whole" as the first word in the ingredient list. Shopping  Buy products labeled as "low-sodium" or "no salt added."  Buy fresh foods. Avoid  canned foods and premade or frozen meals. Cooking  Avoid adding salt when cooking. Use salt-free seasonings or herbs instead of table salt or sea salt. Check with your health care provider or pharmacist before using salt substitutes.  Do not fry foods. Cook foods using healthy methods such as baking, boiling, grilling, and broiling instead.  Cook with heart-healthy oils, such as olive, canola, soybean, or sunflower oil. Meal planning  Eat a balanced diet that includes: ? 5 or more servings of fruits and vegetables each day. At each meal, try to fill half of your plate with fruits and vegetables. ? Up to 6-8 servings of whole grains each day. ? Less than 6 oz of lean meat, poultry, or fish each day. A 3-oz serving of meat is about the same size as a deck of cards. One egg equals 1 oz. ? 2 servings of low-fat dairy each day. ? A serving of nuts, seeds, or beans 5 times each week. ? Heart-healthy fats. Healthy fats called Omega-3 fatty acids are found in foods such as flaxseeds and coldwater fish, like sardines, salmon, and mackerel.  Limit how much you eat of the following: ? Canned or prepackaged foods. ? Food that is high in trans fat, such as fried foods. ? Food that is high in saturated fat, such as fatty meat. ? Sweets, desserts, sugary drinks, and other foods with added sugar. ? Full-fat  dairy products.  Do not salt foods before eating.  Try to eat at least 2 vegetarian meals each week.  Eat more home-cooked food and less restaurant, buffet, and fast food.  When eating at a restaurant, ask that your food be prepared with less salt or no salt, if possible. What foods are recommended? The items listed may not be a complete list. Talk with your dietitian about what dietary choices are best for you. Grains Whole-grain or whole-wheat bread. Whole-grain or whole-wheat pasta. Brown rice. [place]. Bulgur. Whole-grain and low-sodium cereals. Pita bread. Low-fat, low-sodium  crackers. Whole-wheat flour tortillas. Vegetables Fresh or frozen vegetables (raw, steamed, roasted, or grilled). Low-sodium or reduced-sodium tomato and vegetable juice. Low-sodium or reduced-sodium tomato sauce and tomato paste. Low-sodium or reduced-sodium canned vegetables. Fruits All fresh, dried, or frozen fruit. Canned fruit in natural juice (without added sugar). Meat and other protein foods Skinless chicken or [place]. Ground chicken or [place]. Pork with fat trimmed off. Fish and seafood. Egg whites. Dried beans, peas, or lentils. Unsalted nuts, nut butters, and seeds. Unsalted canned beans. Lean cuts of beef with fat trimmed off. Low-sodium, lean deli meat. Dairy Low-fat (1%) or fat-free (skim) milk. Fat-free, low-fat, or reduced-fat cheeses. Nonfat, low-sodium ricotta or cottage cheese. Low-fat or nonfat yogurt. Low-fat, low-sodium cheese. Fats and oils Soft margarine without trans fats. Vegetable oil. Low-fat, reduced-fat, or light mayonnaise and salad dressings (reduced-sodium). Canola, safflower, olive, soybean, and sunflower oils. Avocado. Seasoning and other foods Herbs. Spices. Seasoning mixes without salt. Unsalted popcorn and pretzels. Fat-free sweets. What foods are not recommended? The items listed may not be a complete list. Talk with your dietitian about what dietary choices are best for you. Grains Baked goods made with fat, such as croissants, muffins, or some breads. Dry pasta or rice meal packs. Vegetables Creamed or fried vegetables. Vegetables in a cheese sauce. Regular canned vegetables (not low-sodium or reduced-sodium). Regular canned tomato sauce and paste (not low-sodium or reduced-sodium). Regular tomato and vegetable juice (not low-sodium or reduced-sodium). Angie Fava. Olives. Fruits Canned fruit in a light or heavy syrup. Fried fruit. Fruit in cream or butter sauce. Meat and other protein foods Fatty cuts of meat. Ribs. Fried meat. Berniece Salines. Sausage. Bologna and  other processed lunch meats. Salami. Fatback. Hotdogs. Bratwurst. Salted nuts and seeds. Canned beans with added salt. Canned or smoked fish. Whole eggs or egg yolks. Chicken or [place] with skin. Dairy Whole or 2% milk, cream, and half-and-half. Whole or full-fat cream cheese. Whole-fat or sweetened yogurt. Full-fat cheese. Nondairy creamers. Whipped toppings. Processed cheese and cheese spreads. Fats and oils Butter. Stick margarine. Lard. Shortening. Ghee. Bacon fat. Tropical oils, such as coconut, palm kernel, or palm oil. Seasoning and other foods Salted popcorn and pretzels. Onion salt, garlic salt, seasoned salt, table salt, and sea salt. Worcestershire sauce. Tartar sauce. Barbecue sauce. Teriyaki sauce. Soy sauce, including reduced-sodium. Steak sauce. Canned and packaged gravies. Fish sauce. Oyster sauce. Cocktail sauce. Horseradish that you find on the shelf. Ketchup. Mustard. Meat flavorings and tenderizers. Bouillon cubes. Hot sauce and Tabasco sauce. Premade or packaged marinades. Premade or packaged taco seasonings. Relishes. Regular salad dressings. Where to find more information:  National Heart, Lung, and Start: https://wilson-eaton.com/  American Heart Association: www.heart.org Summary  The DASH eating plan is a healthy eating plan that has been shown to reduce high blood pressure (hypertension). It may also reduce your risk for type 2 diabetes, heart disease, and stroke.  With the DASH eating plan, you should  limit salt (sodium) intake to 2,300 mg a day. If you have hypertension, you may need to reduce your sodium intake to 1,500 mg a day.  When on the DASH eating plan, aim to eat more fresh fruits and vegetables, whole grains, lean proteins, low-fat dairy, and heart-healthy fats.  Work with your health care provider or diet and nutrition specialist (dietitian) to adjust your eating plan to your individual calorie needs. This information is not intended to replace advice  given to you by your health care provider. Make sure you discuss any questions you have with your health care provider. Document Revised: 07/02/2017 Document Reviewed: 07/13/2016 Elsevier Patient Education  2020 [employer].

## 2019-08-19 ENCOUNTER — Encounter: Payer: Self-pay | Admitting: Family Medicine

## 2019-08-19 DIAGNOSIS — J069 Acute upper respiratory infection, unspecified: Secondary | ICD-10-CM | POA: Diagnosis not present

## 2019-08-19 NOTE — Assessment & Plan Note (Signed)
Controlled, no change in medication  

## 2019-08-19 NOTE — Assessment & Plan Note (Signed)
Controlled, no change in medication Christina [name] is reminded of the importance of commitment to daily physical activity for 30 minutes or more, as able and the need to limit carbohydrate intake to 30 to 60 grams per meal to help with blood sugar control.   The need to take medication as prescribed, test blood sugar as directed, and to call between visits if there is a concern that blood sugar is uncontrolled is also discussed.   Christina [name] is reminded of the importance of daily foot exam, annual eye examination, and good blood sugar, blood pressure and cholesterol control.  Diabetic Labs Latest Ref Rng & Units 06/02/2019 02/01/2019 11/25/2018 04/27/2018 03/11/2018  HbA1c 4.8 - 5.6 % 7.5(H) - 9.6(H) 8.9(H) -  Microalbumin Not Estab. ug/mL - - - - -  Micro/Creat Ratio 0 - 29 mg/g creat 42(H) - - - -  Chol 100 - 199 mg/dL 157 - - - -  HDL >39 mg/dL 58 - - - -  Calc LDL 0 - 99 mg/dL 78 - - - -  Triglycerides 0 - 149 mg/dL 118 - - - -  Creatinine 0.57 - 1.00 mg/dL 1.11(H) 0.85 0.83 0.90 0.80   BP/Weight 08/15/2019 06/13/2019 05/30/2019 03/22/2019 03/13/2019 01/31/2019 [id number]  Systolic BP A999333 [id number] Q000111Q A999333 Q000111Q 123456  Diastolic BP 84 88 84 87 64 74 80  Wt. (Lbs) 239.4 232 231 237.8 237 237 240  BMI 39.84 38.61 39.04 40.19 40.68 40.68 41.2  Some encounter information is confidential and restricted. Go to Review Flowsheets activity to see all data.   Foot/eye exam completion dates Latest Ref Rng & Units 05/30/2019 12/29/2017  Eye Exam No Retinopathy - No Retinopathy  Foot Form Completion - Done -

## 2019-08-19 NOTE — Assessment & Plan Note (Signed)
Obesity linked with hypertension and diabetes  Patient re-educated about  the importance of commitment to a  minimum of 150 minutes of exercise per week as able.  The importance of healthy food choices with portion control discussed, as well as eating regularly and within a 12 hour window most days. The need to choose "clean , green" food 50 to 75% of the time is discussed, as well as to make water the primary drink and set a goal of 64 ounces water daily.    Weight /BMI 08/15/2019 06/13/2019 05/30/2019  WEIGHT 239 lb 6.4 oz 232 lb 231 lb  HEIGHT 5\' 5"  5\' 5"  5' 4.5"  BMI 39.84 kg/m2 38.61 kg/m2 39.04 kg/m2  Some encounter information is confidential and restricted. Go to Review Flowsheets activity to see all data.

## 2019-08-19 NOTE — Assessment & Plan Note (Signed)
Controlled, no change in medication DASH diet and commitment to daily physical activity for a minimum of 30 minutes discussed and encouraged, as a part of hypertension management. The importance of attaining a healthy weight is also discussed.  BP/Weight 08/15/2019 06/13/2019 05/30/2019 03/22/2019 03/13/2019 01/31/2019 [id number]  Systolic BP A999333 [id number] Q000111Q A999333 Q000111Q 123456  Diastolic BP 84 88 84 87 64 74 80  Wt. (Lbs) 239.4 232 231 237.8 237 237 240  BMI 39.84 38.61 39.04 40.19 40.68 40.68 41.2  Some encounter information is confidential and restricted. Go to Review Flowsheets activity to see all data.

## 2019-08-24 ENCOUNTER — Other Ambulatory Visit: Payer: Self-pay

## 2019-08-24 ENCOUNTER — Encounter: Payer: Self-pay | Admitting: Family Medicine

## 2019-08-24 ENCOUNTER — Ambulatory Visit (INDEPENDENT_AMBULATORY_CARE_PROVIDER_SITE_OTHER): Payer: BC Managed Care – PPO | Admitting: Family Medicine

## 2019-08-24 VITALS — BP 128/82 | HR 89 | Temp 98.2°F | Resp 15 | Ht 65.0 in | Wt 243.8 lb

## 2019-08-24 DIAGNOSIS — R079 Chest pain, unspecified: Secondary | ICD-10-CM | POA: Diagnosis not present

## 2019-08-24 DIAGNOSIS — J0110 Acute frontal sinusitis, unspecified: Secondary | ICD-10-CM | POA: Diagnosis not present

## 2019-08-24 DIAGNOSIS — R9431 Abnormal electrocardiogram [ECG] [EKG]: Secondary | ICD-10-CM

## 2019-08-24 DIAGNOSIS — R0789 Other chest pain: Secondary | ICD-10-CM

## 2019-08-24 DIAGNOSIS — H6123 Impacted cerumen, bilateral: Secondary | ICD-10-CM | POA: Insufficient documentation

## 2019-08-24 DIAGNOSIS — E1159 Type 2 diabetes mellitus with other circulatory complications: Secondary | ICD-10-CM

## 2019-08-24 DIAGNOSIS — I10 Essential (primary) hypertension: Secondary | ICD-10-CM

## 2019-08-24 MED ORDER — AZITHROMYCIN 250 MG PO TABS: ORAL_TABLET | ORAL | 0 refills | Status: DC

## 2019-08-24 NOTE — Patient Instructions (Addendum)
F/U as before, call if you need me sooner  Both ears are flushed due to wax, which will cause ear pain  I have prescribed azithromycin due to c/o fever and frontal pressure  EKG in office today because of new chest tightness, compared to last eKG in 2018, no change noted, however since not totally normaland yourco morbidity of diabetes,  I am referring you to Cardiology  [facility] that you feel better soon.  Take tylenol only if you have fever or pain.  Thanks for choosing [facility], we consider it a privelige to serve you.

## 2019-08-28 ENCOUNTER — Encounter: Payer: Self-pay | Admitting: Family Medicine

## 2019-08-28 DIAGNOSIS — R9431 Abnormal electrocardiogram [ECG] [EKG]: Secondary | ICD-10-CM | POA: Insufficient documentation

## 2019-08-28 DIAGNOSIS — R0789 Other chest pain: Secondary | ICD-10-CM | POA: Insufficient documentation

## 2019-08-28 NOTE — Assessment & Plan Note (Signed)
Controlled, no change in medication DASH diet and commitment to daily physical activity for a minimum of 30 minutes discussed and encouraged, as a part of hypertension management. The importance of attaining a healthy weight is also discussed.  BP/Weight 08/24/2019 08/15/2019 06/13/2019 05/30/2019 03/22/2019 03/13/2019 [id number]  Systolic BP 0000000 A999333 [id number] Q000111Q A999333 Q000111Q  Diastolic BP 82 84 88 84 87 64 74  Wt. (Lbs) 243.8 239.4 232 231 237.8 237 237  BMI 40.57 39.84 38.61 39.04 40.19 40.68 40.68  Some encounter information is confidential and restricted. Go to Review Flowsheets activity to see all data.

## 2019-08-28 NOTE — Assessment & Plan Note (Signed)
Successful bilateral ear irrigation by LPN

## 2019-08-28 NOTE — Progress Notes (Signed)
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 She is at increased cV  Risk due to IDDM  ROS .  Denies  palpitations and leg swelling Denies abdominal pain, nausea, vomiting,diarrhea or constipation.   Denies dysuria, frequency, hesitancy or incontinence. Denies joint pain, swelling and limitation in mobility. Denies headaches, seizures, numbness, or tingling. Denies uncontrolled depression, anxiety or insomnia. Denies skin break down or rash.   PE  BP 128/82   Pulse 89   Temp 98.2 F (36.8 C) (Temporal)   Resp 15   Ht 5\' 5"  (1.651 m)   Wt 243 lb 12.8 oz (110.6 kg)   SpO2 98%   BMI 40.57 kg/m   Patient alert and oriented and in no cardiopulmonary distress.  HEENT: No facial asymmetry, EOMI,     Neck supple .frontal sinus tenderness  Chest: Clear to auscultation bilaterally.  CVS: S1, S2 no murmurs, no S3.Regular rate. EKG:NSR , rate 81, possible anterior infarct ABD: Soft non tender.   Ext: No edema  MS: Adequate ROM spine, shoulders, hips and knees.  Skin: Intact, no ulcerations or rash noted.  Psych: Good eye contact, normal affect. Memory intact not anxious or depressed appearing.  CNS: CN 2-12 intact, power,  normal throughout.no focal deficits noted.   Assessment & Plan  Bilateral impacted cerumen Successful bilateral ear irrigation by LPN  Frontal sinusitis z pack prescribed  Chest tightness New onset chest tightness, abnormal EKG , though unchanged, since 2018, since high risk, refer to Cardiology  Type 2 diabetes mellitus with vascular disease (Christina [name]) Improved Christina [name] is reminded of the importance of commitment to daily physical activity for 30 minutes  or more, as able and the need to limit carbohydrate intake to 30 to 60 grams per meal to help with blood sugar control.   The need to take medication as prescribed, test blood sugar as directed, and to call between visits if there is a concern that blood sugar is uncontrolled is also discussed.   Christina [name] is reminded of the importance of daily foot exam, annual eye examination, and good blood sugar, blood pressure and cholesterol control.  Diabetic Labs Latest Ref Rng & Units 06/02/2019 02/01/2019 11/25/2018 04/27/2018 03/11/2018  HbA1c 4.8 - 5.6 % 7.5(H) - 9.6(H) 8.9(H) -  Microalbumin Not Estab. ug/mL - - - - -  Micro/Creat Ratio 0 - 29 mg/g creat 42(H) - - - -  Chol 100 - 199 mg/dL 157 - - - -  HDL >39 mg/dL 58 - - - -  Calc LDL 0 - 99 mg/dL 78 - - - -  Triglycerides 0 - 149 mg/dL 118 - - - -  Creatinine 0.57 - 1.00 mg/dL 1.11(H) 0.85 0.83 0.90 0.80   BP/Weight 08/24/2019 08/15/2019 06/13/2019 05/30/2019 03/22/2019 03/13/2019 [id number]  Systolic BP 0000000 A999333 [id number] Q000111Q A999333 Q000111Q  Diastolic BP 82 84 88 84 87 64 74  Wt. (Lbs) 243.8 239.4 232 231 237.8 237 237  BMI 40.57 39.84 38.61 39.04 40.19 40.68 40.68  Some encounter information is confidential and restricted. Go to Review Flowsheets activity to see all data.   Foot/eye exam completion dates Latest Ref  Rng & Units 05/30/2019 12/29/2017  Eye Exam No Retinopathy - No Retinopathy  Foot Form Completion - Done -        Essential hypertension, benign Controlled, no change in medication DASH diet and commitment to daily physical activity for a minimum of 30 minutes discussed and encouraged, as a part of hypertension management. The importance of attaining a healthy weight is also discussed.  BP/Weight 08/24/2019 08/15/2019 06/13/2019 05/30/2019 03/22/2019 03/13/2019 [id number]  Systolic BP 0000000 A999333 [id number] Q000111Q A999333 Q000111Q  Diastolic BP 82 84 88 84 87 64 74  Wt. (Lbs) 243.8 239.4 232 231 237.8 237 237  BMI 40.57 39.84 38.61 39.04 40.19 40.68 40.68    Some encounter information is confidential and restricted. Go to Review Flowsheets activity to see all data.

## 2019-08-28 NOTE — Assessment & Plan Note (Signed)
z pack prescribed 

## 2019-08-28 NOTE — Assessment & Plan Note (Signed)
Improved Christina [name] is reminded of the importance of commitment to daily physical activity for 30 minutes or more, as able and the need to limit carbohydrate intake to 30 to 60 grams per meal to help with blood sugar control.   The need to take medication as prescribed, test blood sugar as directed, and to call between visits if there is a concern that blood sugar is uncontrolled is also discussed.   Christina [name] is reminded of the importance of daily foot exam, annual eye examination, and good blood sugar, blood pressure and cholesterol control.  Diabetic Labs Latest Ref Rng & Units 06/02/2019 02/01/2019 11/25/2018 04/27/2018 03/11/2018  HbA1c 4.8 - 5.6 % 7.5(H) - 9.6(H) 8.9(H) -  Microalbumin Not Estab. ug/mL - - - - -  Micro/Creat Ratio 0 - 29 mg/g creat 42(H) - - - -  Chol 100 - 199 mg/dL 157 - - - -  HDL >39 mg/dL 58 - - - -  Calc LDL 0 - 99 mg/dL 78 - - - -  Triglycerides 0 - 149 mg/dL 118 - - - -  Creatinine 0.57 - 1.00 mg/dL 1.11(H) 0.85 0.83 0.90 0.80   BP/Weight 08/24/2019 08/15/2019 06/13/2019 05/30/2019 03/22/2019 03/13/2019 [id number]  Systolic BP 0000000 A999333 [id number] Q000111Q A999333 Q000111Q  Diastolic BP 82 84 88 84 87 64 74  Wt. (Lbs) 243.8 239.4 232 231 237.8 237 237  BMI 40.57 39.84 38.61 39.04 40.19 40.68 40.68  Some encounter information is confidential and restricted. Go to Review Flowsheets activity to see all data.   Foot/eye exam completion dates Latest Ref Rng & Units 05/30/2019 12/29/2017  Eye Exam No Retinopathy - No Retinopathy  Foot Form Completion - Done -

## 2019-08-28 NOTE — Assessment & Plan Note (Signed)
New onset chest tightness, abnormal EKG , though unchanged, since 2018, since high risk, refer to Cardiology

## 2019-08-29 ENCOUNTER — Ambulatory Visit (INDEPENDENT_AMBULATORY_CARE_PROVIDER_SITE_OTHER): Payer: BC Managed Care – PPO | Admitting: Psychology

## 2019-08-29 ENCOUNTER — Other Ambulatory Visit: Payer: Self-pay

## 2019-08-29 DIAGNOSIS — F411 Generalized anxiety disorder: Secondary | ICD-10-CM | POA: Diagnosis not present

## 2019-08-29 DIAGNOSIS — F3162 Bipolar disorder, current episode mixed, moderate: Secondary | ICD-10-CM | POA: Diagnosis not present

## 2019-08-29 NOTE — Progress Notes (Signed)
Virtual Visit via Telephone Note  I connected with Christina [name] on 08/29/19 at 12:30 PM EST by telephone and verified that I am speaking with the correct person using two identifiers.   I discussed the limitations, risks, security and privacy concerns of performing an evaluation and management service by telephone and the availability of in person appointments. I also discussed with the patient that there may be a patient responsible charge related to this service. The patient expressed understanding and agreed to proceed.     I discussed the assessment and treatment plan with the patient. The patient was provided an opportunity to ask questions and all were answered. The patient agreed with the plan and demonstrated an understanding of the instructions.   The patient was advised to call back or seek an in-person evaluation if the symptoms worsen or if the condition fails to improve as anticipated.  I provided 50 minutes of non-face-to-face time during this encounter.   [name] [facility]    THERAPIST PROGRESS NOTE  Session Time: 12.30pm-1.20pm  Participation Level: Active  Behavioral Response: NAAlertaffect wnl  Type of Therapy: Individual Therapy  Treatment Goals addressed: Diagnosis: bipolar 1 d/o and GAD and goal 1.  Interventions: CBT and Strength-based  Summary: Christina [name] is a 44 y.o. female who presents with affect wnl.  pt reported that she had a covid scare shortly after our last visit- she had a potential exposure at work- first test came back negative but then developed cold symptoms so had to quarantine- 2nd test came back negative and she was able to be tx for sinus infection.  Pt reported that her doctor visit showed an abnormal EKG and has been referred to cardiologist.  Pt reported she had assumed the tightness in chest was anxiety.  Pt reported that she is focused on her self care w/ diabetes to do what she can at this time.  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  Pt reported that she feels that w/ counseling she has been able to learn and approach things differently.   Suicidal/Homicidal: Nowithout intent/plan  Therapist Response: Assessed pt current functioning per pt report. Processed w/pt recent stressors w/ medical concerns. Reflecting statements of guilt and Assisted pt w/ reframing.  Explored transition to new semester and change in schedule and responsibilities.    Plan: Return again in 2 weeks, via webex.  Pt to f/u as scheduled w/ [name].   Diagnosis: Bipolar 1 d/o   [name] [facility] 08/29/2019

## 2019-09-05 ENCOUNTER — Encounter: Payer: Self-pay | Admitting: Gastroenterology

## 2019-09-13 NOTE — Progress Notes (Signed)
Cardiology Office Note   Date:  09/15/2019   ID:  Christina [name], DOB 09-24-1975, MRN [id number]  PCP:  [name], MD  Cardiologist:   Dorris Carnes, MD    Pt referred by Dr Moshe Cipro for CP    History of Present Illness: Christina [name] is a 44 y.o. female with a hx of DM  who is referred by Dr Moshe Cipro for chest tightenss   The pt was seen in [place] and complained of tightness and a dry cough    EKG done   The pt says her symptoms began about 1 month ago    On and off all day   No SOB   No pleuritc or positional component.  Describes as a tightness.   Has a hx of gastroparesis   Followed in GI   Does have problems with food not going down about 1 time per week   Also has a hx of reflux but on acid inhibitor  Note:  CT of abdomen in 03/2018 showed no vascular abnormalities  Small adrenal adenomas noted       Current Meds  Medication Sig  . acetaminophen (TYLENOL) 500 MG tablet Take 1,000 mg every 8 (eight) hours as needed by mouth for mild pain.   . ARIPiprazole (ABILIFY) 5 MG tablet Take 1 tablet (5 mg total) by mouth every evening.  . B-D UF III MINI PEN NEEDLES 31G X 5 MM MISC USE AS DIRECTED  . Coenzyme Q10 (CO Q-10) 300 MG CAPS Take by mouth. Once daily  . Flaxseed, Linseed, (FLAX SEED OIL) 1000 MG CAPS Take 1 capsule by mouth daily.  . fluticasone (FLONASE) 50 MCG/ACT nasal spray Place 2 sprays into both nostrils daily.  Marland Kitchen glucose blood test strip Use as instructed, three times daily  . ibuprofen (ADVIL,MOTRIN) 200 MG tablet Take 600 mg by mouth every 8 (eight) hours as needed (FOR PAIN.).  . Insulin Glargine (LANTUS) 100 UNIT/ML Solostar Pen Inject 50 Units into the skin at bedtime.  Marland Kitchen loratadine (CLARITIN) 10 MG tablet Take 1 tablet (10 mg total) by mouth daily. Taking generic otc allergy (Patient taking differently: Take 10 mg by mouth every evening. Taking generic otc allergy)  . losartan (COZAAR) 50 MG tablet Take 1 tablet (50 mg total) by mouth daily.  . Magnesium 200  MG TABS Take 1 tablet by mouth daily.  . metFORMIN (GLUCOPHAGE) 1000 MG tablet Take 1 tablet (1,000 mg total) by mouth 2 (two) times daily with a meal.  . montelukast (SINGULAIR) 10 MG tablet Take 1 tablet (10 mg total) by mouth at bedtime.  Marland Kitchen omeprazole (PRILOSEC) 20 MG capsule Take 1 capsule (20 mg total) by mouth daily before breakfast.  . ONETOUCH DELICA LANCETS 32I MISC Three times daily testing dx e11.9  . rosuvastatin (CRESTOR) 5 MG tablet Take one tablet by mouth three times weekly  . sodium chloride (OCEAN) 0.65 % SOLN nasal spray Place 1 spray into both nostrils 4 (four) times daily as needed for congestion.  . valACYclovir (VALTREX) 500 MG tablet Take 1,000 mg by mouth daily as needed (for 3 days if needed for out breaks).   . [DISCONTINUED] Multiple Vitamin (MULTIVITAMIN WITH MINERALS) TABS tablet 1 PO DAILY     Allergies:   Other, Peanut oil, Peanut-containing drug products, and Augmentin [amoxicillin-pot clavulanate]   Past Medical History:  Diagnosis Date  . Anemia   . Bipolar disorder ([place])   . Depression   . Diabetes mellitus  type 2 DM, insulin only needed during pregnancy  . Gastroparesis   . 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 GERD (gastroesophageal reflux disease)   . H/O acute sinusitis 10/2016  . Hypertension   . Thyroid enlargement   . Wears glasses     Past Surgical History:  Procedure Laterality Date  . BREAST REDUCTION SURGERY  1994  . CESAREAN SECTION N/A 03/06/2016   Procedure: CESAREAN SECTION;  Surgeon: Ena Dawley, MD;  Location: [facility];  Service: Obstetrics;  Laterality: N/A;  . COLONOSCOPY WITH PROPOFOL N/A 10/26/2017   Dr. Oneida Alar: Torturous transverse and sigmoid colon, internal hemorrhoids.  Next colonoscopy in 10 years with MAC and color wrap  . dermoid tumor  2000  . DILITATION & CURRETTAGE/HYSTROSCOPY WITH NOVASURE ABLATION N/A 12/10/2016   Procedure: DILATATION & CURETTAGE/HYSTEROSCOPY WITH NOVASURE ABLATION;  Surgeon:  Ena Dawley, MD;  Location: [facility];  Service: Gynecology;  Laterality: N/A;  . ESOPHAGOGASTRODUODENOSCOPY  07/01/2009   FFM:[id number] esphagus without barrett's/dilation with 16 mm/mild erthyema in the antrum. mild chronic gastritis on path.   . ESOPHAGOGASTRODUODENOSCOPY (EGD) WITH PROPOFOL N/A 10/26/2017   Dr. Oneida Alar: Gastritis, gastric polyps.  Biopsies benign.  Small bowel biopsies negative for celiac.  No H. pylori.  Marland Kitchen HERNIA REPAIR  12/2018  . TRIGGER FINGER RELEASE Bilateral 06/2015  . TUBAL LIGATION Bilateral 03/06/2016   Procedure: BILATERAL TUBAL LIGATION;  Surgeon: Ena Dawley, MD;  Location: [place];  Service: Obstetrics;  Laterality: Bilateral;     Social History:  The patient  reports that she has never smoked. She has never used smokeless tobacco. She reports current alcohol use. She reports that she does not use drugs.   Family History:  The patient's family history includes Asthma in her father; Depression in her father; Diabetes in her father and mother; Fibromyalgia in her mother; Heart disease in her father; Hypertension in her father and mother; Stroke in her mother.    ROS:  Please see the history of present illness. All other systems are reviewed and  Negative to the above problem except as noted.    PHYSICAL EXAM: VS:  BP 128/83   Pulse 81   Temp 97.8 F (36.6 C)   Ht _0  (1.651 m)   Wt 238 lb (108 kg)   SpO2 99%   BMI 39.61 kg/m   GEN: Morbidly obese 44 yo  in no acute distress  HEENT: normal  Neck: no JVD, carotid bruits Cardiac: RRR; no murmurs, rubs, or gallops,no edema  Chest  Tender on palpation of parsternal region  This is similar to pain / pressure she is having  Respiratory:  clear to auscultation bilaterally, normal work of breathing GI: soft, nontender, nondistended, + BS  No hepatomegaly  MS: no deformity Moving all extremities   Skin: warm and dry, no obvious rash Neuro:  Strength and sensation are  intact Psych: euthymic mood, full affect   EKG:  EKG is NOT ordered today.  oN 08/24/19:  SR  Nonspecific ST changes    Lipid Panel    Component Value Date/Time   CHOL 157 06/02/2019 1006   TRIG 118 06/02/2019 1006   HDL 58 06/02/2019 1006   CHOLHDL 2.8 10/04/2017 0852   CHOLHDL 1.7 03/16/2016 1115   VLDL 26 03/16/2016 1115   LDLCALC 78 06/02/2019 1006      Wt Readings from Last 3 Encounters:  09/15/19 238 lb (108 kg)  08/24/19 243 lb 12.8 oz (110.6  kg)  08/15/19 239 lb 6.4 oz (108.6 kg)      ASSESSMENT AND PLAN:  1  Chst tightness   I do not think her episodes are cardiac    SOme may be GI with hx of gastroparesis   Some appears musculoskel as brought on with CP REcomm:  Stretching exercises for chest   F/U in GI as well     2  LIpids  LDL in Nov 2020 was 91; HDL was 58   With hx of DM she was started on Crestor a few wks ago  Takes 3x per week   I think this is reasonable with DM Hx   Note CT in 2019 showed in significant vascular abnorm in abdomen  3   DM  Followed by Dr Horald Chestnut  4  HTN  Good control on current agent      Will be available as needed      Current medicines are reviewed at length with the patient today.  The patient does not have concerns regarding medicines.  Signed, Dorris Carnes, MD  09/15/2019 9:15 AM    [address], [place], [place]  [address] Phone: [phone]; Fax: [phone]

## 2019-09-15 ENCOUNTER — Ambulatory Visit: Payer: BC Managed Care – PPO | Admitting: Internal Medicine

## 2019-09-15 ENCOUNTER — Other Ambulatory Visit: Payer: Self-pay

## 2019-09-15 ENCOUNTER — Encounter: Payer: Self-pay | Admitting: Internal Medicine

## 2019-09-15 VITALS — BP 128/83 | HR 81 | Temp 97.8°F | Ht 65.0 in | Wt 238.0 lb

## 2019-09-15 DIAGNOSIS — I10 Essential (primary) hypertension: Secondary | ICD-10-CM | POA: Diagnosis not present

## 2019-09-15 DIAGNOSIS — R079 Chest pain, unspecified: Secondary | ICD-10-CM | POA: Diagnosis not present

## 2019-09-15 DIAGNOSIS — E782 Mixed hyperlipidemia: Secondary | ICD-10-CM | POA: Diagnosis not present

## 2019-09-15 NOTE — Patient Instructions (Signed)
Medication Instructions:  Your physician recommends that you continue on your current medications as directed. Please refer to the Current Medication list given to you today.  *If you need a refill on your cardiac medications before your next appointment, please call your pharmacy*  Lab Work: None If you have labs (blood work) drawn today and your tests are completely normal, you will receive your results only by: Marland Kitchen MyChart Message (if you have MyChart) OR . A paper copy in the mail If you have any lab test that is abnormal or we need to change your treatment, we will call you to review the results.  Testing/Procedures: None  Follow-Up: As needed with Dr.Ross      Thank you for choosing [facility] !

## 2019-09-19 ENCOUNTER — Ambulatory Visit (HOSPITAL_COMMUNITY): Payer: BC Managed Care – PPO | Admitting: Psychology

## 2019-10-02 ENCOUNTER — Other Ambulatory Visit: Payer: Self-pay

## 2019-10-02 ENCOUNTER — Ambulatory Visit (INDEPENDENT_AMBULATORY_CARE_PROVIDER_SITE_OTHER): Payer: BC Managed Care – PPO | Admitting: Psychology

## 2019-10-02 DIAGNOSIS — F411 Generalized anxiety disorder: Secondary | ICD-10-CM | POA: Diagnosis not present

## 2019-10-02 DIAGNOSIS — F3162 Bipolar disorder, current episode mixed, moderate: Secondary | ICD-10-CM

## 2019-10-02 NOTE — Progress Notes (Signed)
Virtual Visit via Telephone Note  I connected with Christina [name] on 10/02/19 at 10:00 AM EST by telephone, as video internet connection poor, and verified that I am speaking with the correct person using two identifiers.   I discussed the limitations, risks, security and privacy concerns of performing an evaluation and management service by telephone and the availability of in person appointments. I also discussed with the patient that there may be a patient responsible charge related to this service. The patient expressed understanding and agreed to proceed.    I discussed the assessment and treatment plan with the patient. The patient was provided an opportunity to ask questions and all were answered. The patient agreed with the plan and demonstrated an understanding of the instructions.   The patient was advised to call back or seek an in-person evaluation if the symptoms worsen or if the condition fails to improve as anticipated.  I provided 50 minutes of non-face-to-face time during this encounter.   [name] [facility]    THERAPIST PROGRESS NOTE  Session Time: 10am-10.50am  Participation Level: Active  Behavioral Response: NAAlertaffect wnl  Type of Therapy: Individual Therapy  Treatment Goals addressed: Diagnosis: Bipolar 1 d/o and goal 1.  Interventions: CBT and Supportive  Summary: Christina [name] is a 44 y.o. female who presents with affect wnl.  Pt reported that her health has been doing well overall and has recommitted to changes made at beginning of year w/ healthy eating.  Pt reported that she has been managing w/ work, school, internship, kids.  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  Pt reported felt good to do as has wanted but also nervous to take that step.  Pt reported that her husband and her had tense talk the other night- but overall went well as both able to express some concerns.  He express felt that taking on too  much w/ applying and she expressed concerns his drinking and how impacting.  Pt acknowledged that wasn't resolved but was able to express that feels they need to seek couples counseling.  Pt reported that her son's evaluation is this week and feels good about being able to take these steps to identifying needs and starting interventions.  Suicidal/Homicidal: Nowithout intent/plan  Therapist Response: Assessed pt current functioning per pt report.  Processed w/pt decisions re: moving forward w/ her goals- balancing and acknowledging increase in commitments and benefits receiving.  Reflected pt progress w/ continued expressing concerns and feelings in relationship in healthier ways.    Plan: Return again in 2 weeks, via webex.  F/u as scheduled w/ [name].  Diagnosis: Bipolar 1 d/o and GAD    [name] [facility] 10/02/2019

## 2019-10-17 ENCOUNTER — Other Ambulatory Visit: Payer: Self-pay

## 2019-10-17 ENCOUNTER — Ambulatory Visit (INDEPENDENT_AMBULATORY_CARE_PROVIDER_SITE_OTHER): Payer: BC Managed Care – PPO | Admitting: Psychology

## 2019-10-17 DIAGNOSIS — F3162 Bipolar disorder, current episode mixed, moderate: Secondary | ICD-10-CM | POA: Diagnosis not present

## 2019-10-17 DIAGNOSIS — F411 Generalized anxiety disorder: Secondary | ICD-10-CM | POA: Diagnosis not present

## 2019-10-17 NOTE — Progress Notes (Signed)
Virtual Visit via Video Note  I connected with Christina [name] on 10/17/19 at 10:00 AM EDT by a video enabled telemedicine application and verified that I am speaking with the correct person using two identifiers.   I discussed the limitations of evaluation and management by telemedicine and the availability of in person appointments. The patient expressed understanding and agreed to proceed.    I discussed the assessment and treatment plan with the patient. The patient was provided an opportunity to ask questions and all were answered. The patient agreed with the plan and demonstrated an understanding of the instructions.   The patient was advised to call back or seek an in-person evaluation if the symptoms worsen or if the condition fails to improve as anticipated.  I provided 46 minutes of non-face-to-face time during this encounter.   [name] [facility]    THERAPIST PROGRESS NOTE  Session Time: 10am-10.46am  Participation Level: Active  Behavioral Response: Well GroomedAlertaffect wnl  Type of Therapy: Individual Therapy  Treatment Goals addressed: Diagnosis: Bipolar 1 d/o and GAD and goal 1.  Interventions: CBT and Supportive  Summary: Christina [name] is a 44 y.o. female who presents with affect wnl.  pt reported that she is facing a decision about prioritizing what important.  Pt received invite to go through initiation for sorority that she has been called to for years.  Pt reported that this initiation month coincides w/ last month of the semester and is aware that she will have to sacrifice some w/ school- missing some classes to meet the requirements for sorority.  Pt acknowledged that while she still learn what she needs will likely not have a 4.0 for semester and currently she is at 4.0 w/ her masters degree.  Pt reflected on how this sorority will give her something for herself and is opportunity for her now.  Pt discussed how she does have support of parents to help w/ kids  on weekends and has her husband's full support and encouragement.  Pt acknowledged that this was helpful to process through and acceptance of moving forward w/ her decision.   Suicidal/Homicidal: Nowithout intent/plan  Therapist Response: Assessed pt current functioning per pt report.  Processed w/pt decision she is facing and explored w/her wants, her supports and sacrifices facing.  Reflected to pt that her decision and seems that she has thought out and considered things and drawn towards initiation to sorority vs. 4.0 GPA.    Plan: Return again in 2 weeks, via webex.  F/u as scheduled w/ [name].  Diagnosis: Bipolar 1 d/o and GAD  [name] [facility] 10/17/2019

## 2019-10-23 ENCOUNTER — Ambulatory Visit: Payer: BC Managed Care – PPO | Admitting: Family Medicine

## 2019-11-01 ENCOUNTER — Ambulatory Visit (INDEPENDENT_AMBULATORY_CARE_PROVIDER_SITE_OTHER): Payer: BC Managed Care – PPO | Admitting: Psychiatry

## 2019-11-01 ENCOUNTER — Encounter (HOSPITAL_COMMUNITY): Payer: Self-pay | Admitting: Psychiatry

## 2019-11-01 ENCOUNTER — Other Ambulatory Visit: Payer: Self-pay

## 2019-11-01 DIAGNOSIS — F419 Anxiety disorder, unspecified: Secondary | ICD-10-CM

## 2019-11-01 DIAGNOSIS — F3162 Bipolar disorder, current episode mixed, moderate: Secondary | ICD-10-CM

## 2019-11-01 MED ORDER — ARIPIPRAZOLE 5 MG PO TABS: 5.0000 mg | ORAL_TABLET | Freq: Every evening | ORAL | 0 refills | Status: DC

## 2019-11-01 NOTE — Progress Notes (Signed)
Virtual Visit via Telephone Note  I connected with Christina [name] on 11/01/19 at 10:20 AM EDT by telephone and verified that I am speaking with the correct person using two identifiers.   I discussed the limitations, risks, security and privacy concerns of performing an evaluation and management service by telephone and the availability of in person appointments. I also discussed with the patient that there may be a patient responsible charge related to this service. The patient expressed understanding and agreed to proceed.   History of Present Illness: Patient was evaluated by phone session.  She is doing well on her current medication.  She is in therapy with Leanne.  Sometimes she has issue going to alter her boundaries but she is learning in therapy that she need to stay in her boundaries because it does cause anxiety when she goes out into things which she not supposed to do.  She is happy that her 1-year-old son is now getting speech therapy.  She is sleeping good.  She denies any irritability, mania, anger or any severe mood swings.  She is scheduled to have blood work on April 28.  She is compliant with Abilify and reported no tremors, shakes or any EPS.  She denies any nightmares.  She is hoping by next blood work will be much better since she has a better control on her blood sugar.  She sees endocrinology at [facility].  She is pleased that her parents are vaccinated and she also received 1 dose of vaccine.  She had a good support from her husband and her in-laws.   Past Psychiatric History:Reviewed. H/Odepression, anxiety, mania and psychosis. Admitted in 1997 at [facility] due to suicidal gestures. Seen inthis office since 2006. TriedProzac and Wellbutrin.    Psychiatric Specialty Exam: Physical Exam  Review of Systems  unknown if currently breastfeeding.There is no height or weight on file to calculate BMI.  General Appearance: NA  Eye Contact:  NA  Speech:  Clear and  Coherent and Normal Rate  Volume:  Normal  Mood:  Euthymic  Affect:  NA  Thought Process:  Goal Directed  Orientation:  Full (Time, Place, and Person)  Thought Content:  WDL and Logical  Suicidal Thoughts:  No  Homicidal Thoughts:  No  Memory:  Immediate;   Good Recent;   Good Remote;   Good  Judgement:  Good  Insight:  Good  Psychomotor Activity:  NA  Concentration:  Concentration: Good and Attention Span: Good  Recall:  Good  Fund of Knowledge:  Good  Language:  Good  Akathisia:  No  Handed:  Right  AIMS (if indicated):     Assets:  Communication Skills Desire for Improvement Housing Resilience Social Support Talents/Skills Transportation  ADL's:  Intact  Cognition:  WNL  Sleep:   good      Assessment and Plan: Bipolar disorder type I.  Anxiety.  Patient is a stable on her meds.  She is having blood work on April 28.  Discussed medication side effects and benefits.  Continue Abilify 5 mg daily.  Recommended to call us back if she has any question of any concern.  Follow-up in 3 months.  Follow Up Instructions:    I discussed the assessment and treatment plan with the patient. The patient was provided an opportunity to ask questions and all were answered. The patient agreed with the plan and demonstrated an understanding of the instructions.   The patient was advised to call back or seek  an in-person evaluation if the symptoms worsen or if the condition fails to improve as anticipated.  I provided 20 minutes of non-face-to-face time during this encounter.   Kathlee Nations, MD

## 2019-11-07 ENCOUNTER — Ambulatory Visit (INDEPENDENT_AMBULATORY_CARE_PROVIDER_SITE_OTHER): Payer: BC Managed Care – PPO | Admitting: Psychology

## 2019-11-07 ENCOUNTER — Other Ambulatory Visit: Payer: Self-pay

## 2019-11-07 DIAGNOSIS — F411 Generalized anxiety disorder: Secondary | ICD-10-CM | POA: Diagnosis not present

## 2019-11-07 DIAGNOSIS — F3162 Bipolar disorder, current episode mixed, moderate: Secondary | ICD-10-CM | POA: Diagnosis not present

## 2019-11-07 NOTE — Progress Notes (Signed)
Virtual Visit via Video Note  I connected with Christina [name] on 11/07/19 at  9:00 AM EDT by a video enabled telemedicine application and verified that I am speaking with the correct person using two identifiers.   I discussed the limitations of evaluation and management by telemedicine and the availability of in person appointments. The patient expressed understanding and agreed to proceed.    I discussed the assessment and treatment plan with the patient. The patient was provided an opportunity to ask questions and all were answered. The patient agreed with the plan and demonstrated an understanding of the instructions.   The patient was advised to call back or seek an in-person evaluation if the symptoms worsen or if the condition fails to improve as anticipated.  I provided 50 minutes of non-face-to-face time during this encounter.   [name] [facility]    THERAPIST PROGRESS NOTE  Session Time: 9.01am-9.51am  Participation Level: Active  Behavioral Response: Well GroomedAlertaffect wnl  Type of Therapy: Individual Therapy  Treatment Goals addressed: Diagnosis: bipolar 1 d/o and goal 1  Interventions: CBT and Strength-based  Summary: Christina [name] is a 44 y.o. female who presents with affect wnl.  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  pt reported that she has her energy focused on joining this organization and priority for herself now.  Pt feels that in good place w/ school and aware that grade may drop to B w/ juggling both.  pt discussed how she has been reevaluating school plans and taking a break this summer.  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  Pt reported that she needs time to regroup and not overwhelm w/ fitting in 2 summer courses.  Pt reframing that doesn't need to finish by a certain date to have done well in school.  Pt  discussed other challenges as well w/son being referred for speech and occupation therapy and potential of ASD dx.  Pt acknowledged that needs to focus on what is in her control to manage w/out getting overwhelmed and impacting negatively.   Suicidal/Homicidal: Nowithout intent/plan  Therapist Response: ASsessed pt current functioning per pt report.  Processed w/pt coping w/ stressors and exploring ways she can manage stressors.  Assisted pt w/ reframing how she identifies success and focuses on coping through stressors. Reflected strengths and ability to set more boundaries for self.  Plan: Return again in 2 weeks, via webex.  Diagnosis: Bipolar 1 d/o    [name] [facility] 11/07/2019

## 2019-11-21 ENCOUNTER — Ambulatory Visit (HOSPITAL_COMMUNITY): Payer: BC Managed Care – PPO | Admitting: Psychology

## 2019-11-28 ENCOUNTER — Ambulatory Visit: Payer: BC Managed Care – PPO | Admitting: Family Medicine

## 2019-12-05 ENCOUNTER — Ambulatory Visit (INDEPENDENT_AMBULATORY_CARE_PROVIDER_SITE_OTHER): Payer: BC Managed Care – PPO | Admitting: Psychology

## 2019-12-05 ENCOUNTER — Other Ambulatory Visit: Payer: Self-pay

## 2019-12-05 DIAGNOSIS — F3162 Bipolar disorder, current episode mixed, moderate: Secondary | ICD-10-CM

## 2019-12-05 NOTE — Progress Notes (Signed)
Virtual Visit via Video Note  I connected with Brailynn A Halterman on 12/05/19 at  9:00 AM EDT by a video enabled telemedicine application and verified that I am speaking with the correct person using two identifiers.   I discussed the limitations of evaluation and management by telemedicine and the availability of in person appointments. The patient expressed understanding and agreed to proceed.    I discussed the assessment and treatment plan with the patient. The patient was provided an opportunity to ask questions and all were answered. The patient agreed with the plan and demonstrated an understanding of the instructions.   The patient was advised to call back or seek an in-person evaluation if the symptoms worsen or if the condition fails to improve as anticipated.  I provided 50 minutes of non-face-to-face time during this encounter.   [name] [facility]    THERAPIST PROGRESS NOTE  Session Time: 9am-9.50am  Participation Level: Active  Behavioral Response: Well GroomedAlertBipolar 1 d/o and goal 1.  Type of Therapy: Individual Therapy  Treatment Goals addressed: Diagnosis: Bipolar 1 d/o and goal 1.  Interventions: CBT, Strength-based and Supportive  Summary: ADDELYN LITTIG is a 44 y.o. female who presents with affect wnl.  Pt reported that she is sad as just found out this morning that childhood friend's parent died yesterday.  Pt reported that she also has been faced w/ decision re: school and job.  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  Pt is able to accept that not reflection on her abilities but rather too much w/ school/work/family.  Pt identified that needs to take break for summer to regroup and focus on her family as  priority.  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  Pt discussed how she is aware of what needs to do- just not clarity of how and what this will look like. Pt reported feeling more at ease talking and getting clarification for self w/ needs.  Pt informed of pt choice to continue w/ current counselor at new practice.  Suicidal/Homicidal: Nowithout intent/plan  Therapist Response: Assessed pt current functioning per pt report. Processed w/pt coping w/ new barrier for completing masters as originally planned.  Encouraged pt to take time to step back, explore priorities, explore options before making decision.  Assisted pt w/ making reframes and identifying wants for self, career and family.  Discussed counselor's last day w/ current practice on 12/21/19 and pt choice w/ transition for tx.   Plan: Return again in 2 weeks, via webex.  F/u as scheduled w/ [name].  Pt to call to [place] for transition of counseling per pt choice.   Diagnosis: Bipolar 1 d/o  [name] [facility] 12/05/2019

## 2019-12-14 ENCOUNTER — Ambulatory Visit: Payer: BC Managed Care – PPO | Admitting: Family Medicine

## 2019-12-19 ENCOUNTER — Ambulatory Visit (INDEPENDENT_AMBULATORY_CARE_PROVIDER_SITE_OTHER): Payer: BC Managed Care – PPO | Admitting: Psychology

## 2019-12-19 ENCOUNTER — Other Ambulatory Visit: Payer: Self-pay

## 2019-12-19 DIAGNOSIS — F3162 Bipolar disorder, current episode mixed, moderate: Secondary | ICD-10-CM

## 2019-12-19 DIAGNOSIS — F411 Generalized anxiety disorder: Secondary | ICD-10-CM | POA: Diagnosis not present

## 2019-12-19 NOTE — Progress Notes (Signed)
Virtual Visit via Video Note  I connected with Christina [name] on 12/19/19 at  9:00 AM EDT by a video enabled telemedicine application and verified that I am speaking with the correct person using two identifiers.   I discussed the limitations of evaluation and management by telemedicine and the availability of in person appointments. The patient expressed understanding and agreed to proceed.   I discussed the assessment and treatment plan with the patient. The patient was provided an opportunity to ask questions and all were answered. The patient agreed with the plan and demonstrated an understanding of the instructions.   The patient was advised to call back or seek an in-person evaluation if the symptoms worsen or if the condition fails to improve as anticipated.  I provided 47 minutes of non-face-to-face time during this encounter.   [name] [facility]    THERAPIST PROGRESS NOTE  Session Time: 9am-9.47am  Participation Level: Active  Behavioral Response: Well GroomedAlertaffect wnl  Type of Therapy: Individual Therapy  Treatment Goals addressed: Diagnosis: Bipolar 1 d/o and goal 1.  Interventions: CBT and Strength-based  Summary: Christina [name] is a 44 y.o. female who presents with affect wnl.  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  Pt reported that she is aware that will be demanding, but wants to finish her masters and after talking w/ a peer in field- feels that is doable.  Pt discussed that she has become aware of some things that give her joy- part of sorority and tennis- and need to find ways to incorporate joy for self as get fatigued by always doing for others.  Pt discussed same for her husband and even w/ struggles so grateful for how they have worked as a Therapist, occupational and through this pandemic.  Pt reported that seeing garden is this for him.  Pt discussed his  alcoholism, how impacts their interactions and questions her response to.  Pt agreed that would be beneficial to have couples counseling to be able to build better communication and be able to express this to husband and that it's not an ultimatum.     Suicidal/Homicidal: Nowithout intent/plan  Therapist Response: Assessed pt current functioning per pt report. Processed w/pt coping w/ stressors and her decision about master program moving forward.  Reflected to strength in being able to make decision that best for self.  Explored activities that give her joy and need for this.  Processed relationship struggles and desires to resolve and work through together- recommend counseling.  Plan: Return again in 2-3 weeks.  Pt to call to [place] to schedule follow for counseling.  Pt to continue as scheduled w/ [name].   Diagnosis: Bipolar d/o  [name] [facility] 12/19/2019

## 2019-12-21 DIAGNOSIS — J309 Allergic rhinitis, unspecified: Secondary | ICD-10-CM | POA: Diagnosis not present

## 2019-12-21 DIAGNOSIS — J3089 Other allergic rhinitis: Secondary | ICD-10-CM | POA: Diagnosis not present

## 2019-12-21 DIAGNOSIS — T781XXD Other adverse food reactions, not elsewhere classified, subsequent encounter: Secondary | ICD-10-CM | POA: Diagnosis not present

## 2019-12-21 DIAGNOSIS — J301 Allergic rhinitis due to pollen: Secondary | ICD-10-CM | POA: Diagnosis not present

## 2019-12-22 DIAGNOSIS — Z6841 Body Mass Index (BMI) 40.0 and over, adult: Secondary | ICD-10-CM | POA: Diagnosis not present

## 2019-12-22 DIAGNOSIS — Z01419 Encounter for gynecological examination (general) (routine) without abnormal findings: Secondary | ICD-10-CM | POA: Diagnosis not present

## 2019-12-22 DIAGNOSIS — N939 Abnormal uterine and vaginal bleeding, unspecified: Secondary | ICD-10-CM | POA: Diagnosis not present

## 2019-12-22 LAB — HEMOGLOBIN A1C: Hemoglobin A1C: 9.4

## 2019-12-29 DIAGNOSIS — J301 Allergic rhinitis due to pollen: Secondary | ICD-10-CM | POA: Diagnosis not present

## 2020-01-02 ENCOUNTER — Ambulatory Visit: Payer: BC Managed Care – PPO | Admitting: Psychology

## 2020-01-02 DIAGNOSIS — J3089 Other allergic rhinitis: Secondary | ICD-10-CM | POA: Diagnosis not present

## 2020-01-04 ENCOUNTER — Encounter: Payer: Self-pay | Admitting: Family Medicine

## 2020-01-04 ENCOUNTER — Other Ambulatory Visit: Payer: Self-pay

## 2020-01-04 ENCOUNTER — Ambulatory Visit: Payer: BC Managed Care – PPO | Admitting: Family Medicine

## 2020-01-04 VITALS — BP 130/90 | HR 89 | Temp 97.2°F | Resp 16 | Ht 65.0 in | Wt 238.0 lb

## 2020-01-04 DIAGNOSIS — D508 Other iron deficiency anemias: Secondary | ICD-10-CM

## 2020-01-04 DIAGNOSIS — F3162 Bipolar disorder, current episode mixed, moderate: Secondary | ICD-10-CM

## 2020-01-04 DIAGNOSIS — K219 Gastro-esophageal reflux disease without esophagitis: Secondary | ICD-10-CM

## 2020-01-04 DIAGNOSIS — E1159 Type 2 diabetes mellitus with other circulatory complications: Secondary | ICD-10-CM

## 2020-01-04 DIAGNOSIS — Z1231 Encounter for screening mammogram for malignant neoplasm of breast: Secondary | ICD-10-CM | POA: Diagnosis not present

## 2020-01-04 DIAGNOSIS — I10 Essential (primary) hypertension: Secondary | ICD-10-CM

## 2020-01-04 NOTE — Patient Instructions (Addendum)
F/U in 4 months, with MD, call if you need me sooner  Mammogram will be scheduled  Fasting lipid, cmp and EGFr and TSH and iron at [place] , in next 1 to 2 weeks  It is important that you exercise regularly at least 30 minutes 5 times a week. If you develop chest pain, have severe difficulty breathing, or feel very tired, stop exercising immediately and seek medical attention   Think about what you will eat, plan ahead. Choose " clean, green, fresh or frozen" over canned, processed or packaged foods which are more sugary, salty and fatty. 70 to 75% of food eaten should be vegetables and fruit. Three meals at set times with snacks allowed between meals, but they must be fruit or vegetables. Aim to eat over a 12 hour period , example 7 am to 7 pm, and STOP after  your last meal of the day. Drink water,generally about 64 ounces per day, no other drink is as healthy. Fruit juice is best enjoyed in a healthy way, by EATING the fruit.  Thanks for choosing [facility], we consider it a privelige to serve you.

## 2020-01-05 ENCOUNTER — Encounter: Payer: Self-pay | Admitting: Family Medicine

## 2020-01-05 NOTE — Assessment & Plan Note (Signed)
Uncontrolled however no med change DASH diet and commitment to daily physical activity for a minimum of 30 minutes discussed and encouraged, as a part of hypertension management. The importance of attaining a healthy weight is also discussed.  BP/Weight 01/04/2020 09/15/2019 08/24/2019 08/15/2019 06/13/2019 05/30/2019 4/50/3888  Systolic BP 280 034 917 915 056 979 480  Diastolic BP 90 83 82 84 88 84 87  Wt. (Lbs) 238 238 243.8 239.4 232 231 237.8  BMI 39.61 39.61 40.57 39.84 38.61 39.04 40.19  Some encounter information is confidential and restricted. Go to Review Flowsheets activity to see all data.

## 2020-01-05 NOTE — Assessment & Plan Note (Signed)
Controlled, no change in medication  

## 2020-01-05 NOTE — Assessment & Plan Note (Signed)
  Patient re-educated about  the importance of commitment to a  minimum of 150 minutes of exercise per week as able.  The importance of healthy food choices with portion control discussed, as well as eating regularly and within a 12 hour window most days. The need to choose "clean , green" food 50 to 75% of the time is discussed, as well as to make water the primary drink and set a goal of 64 ounces water daily.    Weight /BMI 01/04/2020 09/15/2019 08/24/2019  WEIGHT 238 lb 238 lb 243 lb 12.8 oz  HEIGHT 5\' 5"  5\' 5"  5\' 5"   BMI 39.61 kg/m2 39.61 kg/m2 40.57 kg/m2  Some encounter information is confidential and restricted. Go to Review Flowsheets activity to see all data.

## 2020-01-05 NOTE — Assessment & Plan Note (Signed)
Deteriorated due to non compliance, however , reports that she has changed behavior. Has appt with Endo in 3 months Christina [name] is reminded of the importance of commitment to daily physical activity for 30 minutes or more, as able and the need to limit carbohydrate intake to 30 to 60 grams per meal to help with blood sugar control.   The need to take medication as prescribed, test blood sugar as directed, and to call between visits if there is a concern that blood sugar is uncontrolled is also discussed.   Christina [name] is reminded of the importance of daily foot exam, annual eye examination, and good blood sugar, blood pressure and cholesterol control.  Diabetic Labs Latest Ref Rng & Units 12/22/2019 06/02/2019 02/01/2019 11/25/2018 04/27/2018  HbA1c - 9.4 7.5(H) - 9.6(H) 8.9(H)  Microalbumin Not Estab. ug/mL - - - - -  Micro/Creat Ratio 0 - 29 mg/g creat - 42(H) - - -  Chol 100 - 199 mg/dL - 157 - - -  HDL >39 mg/dL - 58 - - -  Calc LDL 0 - 99 mg/dL - 78 - - -  Triglycerides 0 - 149 mg/dL - 118 - - -  Creatinine 0.57 - 1.00 mg/dL - 1.11(H) 0.85 0.83 0.90   BP/Weight 01/04/2020 09/15/2019 08/24/2019 08/15/2019 06/13/2019 05/30/2019 12/06/3974  Systolic BP 734 193 790 240 973 532 992  Diastolic BP 90 83 82 84 88 84 87  Wt. (Lbs) 238 238 243.8 239.4 232 231 237.8  BMI 39.61 39.61 40.57 39.84 38.61 39.04 40.19  Some encounter information is confidential and restricted. Go to Review Flowsheets activity to see all data.   Foot/eye exam completion dates Latest Ref Rng & Units 05/30/2019 12/29/2017  Eye Exam No Retinopathy - No Retinopathy  Foot Form Completion - Done -

## 2020-01-05 NOTE — Progress Notes (Signed)
Christina [name]     MRN: [id number]      DOB: Apr 18, 1976   HPI Christina [name] here for follow up and re-evaluation of chronic medical conditions, medication management and review of any available recent lab and radiology data.  Preventive health [name] updated, specifically  Cancer screening and Immunization.   Questions or concerns regarding consultations or procedures which the PT has had in the interim are  addressed. The PT denies any adverse reactions to current medications since the last visit.  Reports increased stress in the past several months and non compliance with diabetic diet, hence hBA1c very elevated and uncontrolled , however reports changing behavior since past 2 weeks with normalization Denies polyuria, polydipsia, blurred vision , or hypoglycemic episodes.   ROS Denies recent fever or chills. Denies sinus pressure, nasal congestion, ear pain or sore throat. Denies chest congestion, productive cough or wheezing. Denies chest pains, palpitations and leg swelling Denies abdominal pain, nausea, vomiting,diarrhea or constipation.   Denies dysuria, frequency, hesitancy or incontinence. Denies joint pain, swelling and limitation in mobility. Denies headaches, seizures, numbness, or tingling.    PE  BP 130/90   Pulse 89   Temp (!) 97.2 F (36.2 C) (Temporal)   Resp 16   Ht 5\' 5"  (1.651 m)   Wt 238 lb (108 kg)   SpO2 98%   BMI 39.61 kg/m   Patient alert and oriented and in no cardiopulmonary distress.  HEENT: No facial asymmetry, EOMI,     Neck supple .  Chest: Clear to auscultation bilaterally.  CVS: S1, S2 no murmurs, no S3.Regular rate.  ABD: Soft non tender.   Ext: No edema  MS: Adequate ROM spine, shoulders, hips and knees.  Skin: Intact, no ulcerations or rash noted.  Psych: Good eye contact, normal affect. Memory intact not anxious or depressed appearing.  CNS: CN 2-12 intact, power,  normal throughout.no focal deficits noted.   Assessment &  Plan  Essential hypertension, benign Uncontrolled however no med change DASH diet and commitment to daily physical activity for a minimum of 30 minutes discussed and encouraged, as a part of hypertension management. The importance of attaining a healthy weight [name] also discussed.  BP/Weight 01/04/2020 09/15/2019 08/24/2019 08/15/2019 06/13/2019 05/30/2019 8/67/6195  Systolic BP 093 267 124 580 998 338 250  Diastolic BP 90 83 82 84 88 84 87  Wt. (Lbs) 238 238 243.8 239.4 232 231 237.8  BMI 39.61 39.61 40.57 39.84 38.61 39.04 40.19  Some encounter information [name] confidential and restricted. Go to Review Flowsheets activity to see all data.       Morbid obesity ([place])  Patient re-educated about  the importance of commitment to a  minimum of 150 minutes of exercise per week as able.  The importance of healthy food choices with portion control discussed, as well as eating regularly and within a 12 hour window most days. The need to choose "clean , green" food 50 to 75% of the time [name] discussed, as well as to make water the primary drink and set a goal of 64 ounces water daily.    Weight /BMI 01/04/2020 09/15/2019 08/24/2019  WEIGHT 238 lb 238 lb 243 lb 12.8 oz  HEIGHT 5\' 5"  5\' 5"  5\' 5"   BMI 39.61 kg/m2 39.61 kg/m2 40.57 kg/m2  Some encounter information [name] confidential and restricted. Go to Review Flowsheets activity to see all data.      Type 2 diabetes mellitus with vascular disease ([place]) Deteriorated due to non  compliance, however , reports that she has changed behavior. Has appt with Endo in 3 months Christina [name] [name] reminded of the importance of commitment to daily physical activity for 30 minutes or more, as able and the need to limit carbohydrate intake to 30 to 60 grams per meal to help with blood sugar control.   The need to take medication as prescribed, test blood sugar as directed, and to call between visits if there [name] a concern that blood sugar [name] uncontrolled [name] also discussed.    Christina [name] [name] reminded of the importance of daily foot exam, annual eye examination, and good blood sugar, blood pressure and cholesterol control.  Diabetic Labs Latest Ref Rng & Units 12/22/2019 06/02/2019 02/01/2019 11/25/2018 04/27/2018  HbA1c - 9.4 7.5(H) - 9.6(H) 8.9(H)  Microalbumin Not Estab. ug/mL - - - - -  Micro/Creat Ratio 0 - 29 mg/g creat - 42(H) - - -  Chol 100 - 199 mg/dL - 157 - - -  HDL >39 mg/dL - 58 - - -  Calc LDL 0 - 99 mg/dL - 78 - - -  Triglycerides 0 - 149 mg/dL - 118 - - -  Creatinine 0.57 - 1.00 mg/dL - 1.11(H) 0.85 0.83 0.90   BP/Weight 01/04/2020 09/15/2019 08/24/2019 08/15/2019 06/13/2019 05/30/2019 6/55/3748  Systolic BP 270 786 754 492 010 071 219  Diastolic BP 90 83 82 84 88 84 87  Wt. (Lbs) 238 238 243.8 239.4 232 231 237.8  BMI 39.61 39.61 40.57 39.84 38.61 39.04 40.19  Some encounter information [name] confidential and restricted. Go to Review Flowsheets activity to see all data.   Foot/eye exam completion dates Latest Ref Rng & Units 05/30/2019 12/29/2017  Eye Exam No Retinopathy - No Retinopathy  Foot Form Completion - Done -        Bipolar 1 disorder, mixed, moderate (HCC) Stable and managed by Psych  GERD Controlled, no change in medication

## 2020-01-05 NOTE — Assessment & Plan Note (Signed)
Stable and managed by Psych 

## 2020-01-11 DIAGNOSIS — N939 Abnormal uterine and vaginal bleeding, unspecified: Secondary | ICD-10-CM | POA: Diagnosis not present

## 2020-01-12 DIAGNOSIS — D508 Other iron deficiency anemias: Secondary | ICD-10-CM | POA: Diagnosis not present

## 2020-01-12 DIAGNOSIS — E1159 Type 2 diabetes mellitus with other circulatory complications: Secondary | ICD-10-CM | POA: Diagnosis not present

## 2020-01-12 DIAGNOSIS — D539 Nutritional anemia, unspecified: Secondary | ICD-10-CM | POA: Diagnosis not present

## 2020-01-13 LAB — LIPID PANEL WITH LDL/HDL RATIO
Cholesterol, Total: 124 mg/dL (ref 100–199)
HDL: 66 mg/dL (ref >39)
LDL Chol Calc (NIH): 41 mg/dL (ref 0–99)
LDL/HDL Ratio: 0.6 ratio (ref 0.0–3.2)
Triglycerides: 88 mg/dL (ref 0–149)
VLDL Cholesterol Cal: 17 mg/dL (ref 5–40)

## 2020-01-13 LAB — CMP14+EGFR
ALT: 9 IU/L (ref 0–32)
AST: 14 IU/L (ref 0–40)
Albumin/Globulin Ratio: 1.7 (ref 1.2–2.2)
Albumin: 4.4 g/dL (ref 3.8–4.8)
Alkaline Phosphatase: 67 IU/L (ref 48–121)
BUN/Creatinine Ratio: 10 (ref 9–23)
BUN: 8 mg/dL (ref 6–24)
Bilirubin Total: 0.4 mg/dL (ref 0.0–1.2)
CO2: 23 mmol/L (ref 20–29)
Calcium: 9.6 mg/dL (ref 8.7–10.2)
Chloride: 102 mmol/L (ref 96–106)
Creatinine, Ser: 0.83 mg/dL (ref 0.57–1.00)
GFR calc Af Amer: 99 mL/min/{1.73_m2} (ref >59)
GFR calc non Af Amer: 86 mL/min/{1.73_m2} (ref >59)
Globulin, Total: 2.6 g/dL (ref 1.5–4.5)
Glucose: 108 mg/dL — ABNORMAL HIGH (ref 65–99)
Potassium: 4.5 mmol/L (ref 3.5–5.2)
Sodium: 144 mmol/L (ref 134–144)
Total Protein: 7 g/dL (ref 6.0–8.5)

## 2020-01-13 LAB — IRON: Iron: 74 ug/dL (ref 27–159)

## 2020-01-13 LAB — TSH: TSH: 1.42 u[IU]/mL (ref 0.450–4.500)

## 2020-01-16 ENCOUNTER — Ambulatory Visit: Payer: BC Managed Care – PPO | Admitting: Psychology

## 2020-01-16 ENCOUNTER — Ambulatory Visit (INDEPENDENT_AMBULATORY_CARE_PROVIDER_SITE_OTHER): Payer: BC Managed Care – PPO | Admitting: Psychology

## 2020-01-16 DIAGNOSIS — F319 Bipolar disorder, unspecified: Secondary | ICD-10-CM

## 2020-01-29 ENCOUNTER — Ambulatory Visit (HOSPITAL_COMMUNITY): Payer: BC Managed Care – PPO | Admitting: Psychiatry

## 2020-01-30 ENCOUNTER — Encounter: Payer: Self-pay | Admitting: Family Medicine

## 2020-01-30 ENCOUNTER — Ambulatory Visit (INDEPENDENT_AMBULATORY_CARE_PROVIDER_SITE_OTHER): Payer: BC Managed Care – PPO | Admitting: Psychology

## 2020-01-30 DIAGNOSIS — D2339 Other benign neoplasm of skin of other parts of face: Secondary | ICD-10-CM | POA: Diagnosis not present

## 2020-01-30 DIAGNOSIS — F319 Bipolar disorder, unspecified: Secondary | ICD-10-CM

## 2020-01-30 DIAGNOSIS — L821 Other seborrheic keratosis: Secondary | ICD-10-CM | POA: Diagnosis not present

## 2020-01-30 DIAGNOSIS — D229 Melanocytic nevi, unspecified: Secondary | ICD-10-CM | POA: Diagnosis not present

## 2020-01-30 DIAGNOSIS — D225 Melanocytic nevi of trunk: Secondary | ICD-10-CM | POA: Diagnosis not present

## 2020-01-30 DIAGNOSIS — D485 Neoplasm of uncertain behavior of skin: Secondary | ICD-10-CM | POA: Diagnosis not present

## 2020-01-31 ENCOUNTER — Telehealth (INDEPENDENT_AMBULATORY_CARE_PROVIDER_SITE_OTHER): Payer: BC Managed Care – PPO | Admitting: Psychiatry

## 2020-01-31 ENCOUNTER — Other Ambulatory Visit: Payer: Self-pay

## 2020-01-31 ENCOUNTER — Encounter (HOSPITAL_COMMUNITY): Payer: Self-pay | Admitting: Psychiatry

## 2020-01-31 DIAGNOSIS — F419 Anxiety disorder, unspecified: Secondary | ICD-10-CM | POA: Diagnosis not present

## 2020-01-31 DIAGNOSIS — F3162 Bipolar disorder, current episode mixed, moderate: Secondary | ICD-10-CM

## 2020-01-31 MED ORDER — ARIPIPRAZOLE 5 MG PO TABS: 5.0000 mg | ORAL_TABLET | Freq: Every evening | ORAL | 0 refills | Status: DC

## 2020-01-31 NOTE — Progress Notes (Signed)
Virtual Visit via Telephone Note  I connected with Christina [name] on 01/31/20 at  9:20 AM EDT by telephone and verified that I am speaking with the correct person using two identifiers.  Location: Patient: work Provider: Biomedical scientist   I discussed the limitations, risks, security and privacy concerns of performing an evaluation and management service by telephone and the availability of in person appointments. I also discussed with the patient that there may be a patient responsible charge related to this service. The patient expressed understanding and agreed to proceed.   History of Present Illness: Patient is evaluated by phone session.  She reported her mood symptoms and anxiety is much better on Abilify.  She denies any highs or lows, mania, depression, crying spells or any anger issues.  However she is concerned about her high blood sugar.  Her last level was 9.4.  She admitted during pandemic she has been not watching her calorie intake and increase carbohydrates may have caused this.  But now she is more consistent with her diet plan.  She is working and she feels her job is going well.  She denies any irritability.  She is sleeping good.  She is seen endocrinology at [facility].  Her 14-year-old son getting speech therapy and doing better.  She has no tremor shakes or any EPS.  She had a good support from her husband.  They started going guardians and she enjoyed spending time in the garden.   Past Psychiatric History:Reviewed. H/Odepression, anxiety, mania and psychosis. Admitted in 1997 at [facility] due to suicidal gestures. Seen inthis office since 2006. TriedProzac and Wellbutrin.   Recent Results (from the past 2160 hour(s))  Hemoglobin A1c     Status: None   Collection Time: 12/22/19 12:00 AM  Result Value Ref Range   Hemoglobin A1C 9.4   Lipid Panel With LDL/HDL Ratio     Status: None   Collection Time: 01/12/20  9:00 AM  Result Value Ref Range   Cholesterol, Total 124  100 - 199 mg/dL   Triglycerides 88 0 - 149 mg/dL   HDL 66 >39 mg/dL   VLDL Cholesterol Cal 17 5 - 40 mg/dL   LDL Chol Calc (NIH) 41 0 - 99 mg/dL   LDL/HDL Ratio 0.6 0.0 - 3.2 ratio    Comment:                                     LDL/HDL Ratio                                             Men  Women                               1/2 Avg.Risk  1.0    1.5                                   Avg.Risk  3.6    3.2                                2X Avg.Risk  6.2  5.0                                3X Avg.Risk  8.0    6.1   CMP14+EGFR     Status: Abnormal   Collection Time: 01/12/20  9:00 AM  Result Value Ref Range   Glucose 108 (H) 65 - 99 mg/dL   BUN 8 6 - 24 mg/dL   Creatinine, Ser 0.83 0.57 - 1.00 mg/dL   GFR calc non Af Amer 86 >59 mL/min/1.73   GFR calc Af Amer 99 >59 mL/min/1.73    Comment: **[facility] currently reports eGFR in compliance with the current**   recommendations of the [employer]. [facility] will   update reporting as new guidelines are published from the NKF-ASN   Task force.    BUN/Creatinine Ratio 10 9 - 23   Sodium 144 134 - 144 mmol/L   Potassium 4.5 3.5 - 5.2 mmol/L   Chloride 102 96 - 106 mmol/L   CO2 23 20 - 29 mmol/L   Calcium 9.6 8.7 - 10.2 mg/dL   Total Protein 7.0 6.0 - 8.5 g/dL   Albumin 4.4 3.8 - 4.8 g/dL   Globulin, Total 2.6 1.5 - 4.5 g/dL   Albumin/Globulin Ratio 1.7 1.2 - 2.2   Bilirubin Total 0.4 0.0 - 1.2 mg/dL   Alkaline Phosphatase 67 48 - 121 IU/L   AST 14 0 - 40 IU/L   ALT 9 0 - 32 IU/L  TSH     Status: None   Collection Time: 01/12/20  9:00 AM  Result Value Ref Range   TSH 1.420 0.450 - 4.500 uIU/mL  Iron     Status: None   Collection Time: 01/12/20  9:00 AM  Result Value Ref Range   Iron 74 27 - 159 ug/dL     Psychiatric Specialty Exam: Physical Exam  Review of Systems  Weight 238 lb (108 kg), unknown if currently breastfeeding.There is no height or weight on file to calculate BMI.  General Appearance: NA  Eye  Contact:  NA  Speech:  Clear and Coherent  Volume:  Normal  Mood:  Euthymic  Affect:  NA  Thought Process:  Goal Directed  Orientation:  Full (Time, Place, and Person)  Thought Content:  Logical  Suicidal Thoughts:  No  Homicidal Thoughts:  No  Memory:  Immediate;   Good Recent;   Good Remote;   Good  Judgement:  Intact  Insight:  Present  Psychomotor Activity:  NA  Concentration:  Concentration: Good and Attention Span: Good  Recall:  Good  Fund of Knowledge:  Good  Language:  Good  Akathisia:  No  Handed:  Right  AIMS (if indicated):     Assets:  Communication Skills Desire for Improvement Housing Resilience Social Support  ADL's:  Intact  Cognition:  WNL  Sleep:         Assessment and Plan: Bipolar disorder type I.  Anxiety.  I reviewed blood work results with her.  Her hemoglobin A1c is very high and she is trying to be compliant with her diet plan.  She is hoping next blood work will show improved results.  She does not want to change Abilify since it is helping her mood symptoms or anxiety.  Continue Abilify 5 mg daily.  She is in therapy with Leeann and encouraged to continue therapy.  Recommended to call us back if she has any question or  any concern.  Follow-up in 3 months.  Follow Up Instructions:    I discussed the assessment and treatment plan with the patient. The patient was provided an opportunity to ask questions and all were answered. The patient agreed with the plan and demonstrated an understanding of the instructions.   The patient was advised to call back or seek an in-person evaluation if the symptoms worsen or if the condition fails to improve as anticipated.  I provided 20 minutes of non-face-to-face time during this encounter.   Kathlee Nations, MD

## 2020-02-02 DIAGNOSIS — N939 Abnormal uterine and vaginal bleeding, unspecified: Secondary | ICD-10-CM | POA: Diagnosis not present

## 2020-02-13 ENCOUNTER — Ambulatory Visit: Payer: BC Managed Care – PPO | Admitting: Psychology

## 2020-02-13 ENCOUNTER — Encounter: Payer: Self-pay | Admitting: Family Medicine

## 2020-02-14 DIAGNOSIS — E119 Type 2 diabetes mellitus without complications: Secondary | ICD-10-CM | POA: Diagnosis not present

## 2020-02-14 LAB — HM DIABETES EYE EXAM

## 2020-02-21 ENCOUNTER — Ambulatory Visit (INDEPENDENT_AMBULATORY_CARE_PROVIDER_SITE_OTHER): Payer: BC Managed Care – PPO | Admitting: Psychology

## 2020-02-21 DIAGNOSIS — F319 Bipolar disorder, unspecified: Secondary | ICD-10-CM | POA: Diagnosis not present

## 2020-02-23 ENCOUNTER — Other Ambulatory Visit: Payer: Self-pay

## 2020-02-23 ENCOUNTER — Ambulatory Visit
Admission: RE | Admit: 2020-02-23 | Discharge: 2020-02-23 | Disposition: A | Discharge status: Home / Self Care | Payer: BC Managed Care – PPO | Source: Ambulatory Visit | Attending: Family Medicine | Admitting: Family Medicine

## 2020-02-23 DIAGNOSIS — Z1231 Encounter for screening mammogram for malignant neoplasm of breast: Secondary | ICD-10-CM

## 2020-02-27 ENCOUNTER — Ambulatory Visit: Payer: BC Managed Care – PPO | Admitting: Psychology

## 2020-03-12 ENCOUNTER — Ambulatory Visit: Payer: BC Managed Care – PPO | Admitting: Psychology

## 2020-03-12 ENCOUNTER — Ambulatory Visit (INDEPENDENT_AMBULATORY_CARE_PROVIDER_SITE_OTHER): Payer: BC Managed Care – PPO | Admitting: Psychology

## 2020-03-12 DIAGNOSIS — F319 Bipolar disorder, unspecified: Secondary | ICD-10-CM

## 2020-03-20 DIAGNOSIS — Z20828 Contact with and (suspected) exposure to other viral communicable diseases: Secondary | ICD-10-CM | POA: Diagnosis not present

## 2020-03-20 DIAGNOSIS — R0602 Shortness of breath: Secondary | ICD-10-CM | POA: Diagnosis not present

## 2020-03-20 DIAGNOSIS — R05 Cough: Secondary | ICD-10-CM | POA: Diagnosis not present

## 2020-04-02 ENCOUNTER — Other Ambulatory Visit: Payer: Self-pay

## 2020-04-02 DIAGNOSIS — E1165 Type 2 diabetes mellitus with hyperglycemia: Secondary | ICD-10-CM | POA: Diagnosis not present

## 2020-04-02 DIAGNOSIS — D3501 Benign neoplasm of right adrenal gland: Secondary | ICD-10-CM | POA: Diagnosis not present

## 2020-04-02 DIAGNOSIS — I10 Essential (primary) hypertension: Secondary | ICD-10-CM | POA: Diagnosis not present

## 2020-04-02 DIAGNOSIS — E785 Hyperlipidemia, unspecified: Secondary | ICD-10-CM | POA: Diagnosis not present

## 2020-04-04 ENCOUNTER — Ambulatory Visit (INDEPENDENT_AMBULATORY_CARE_PROVIDER_SITE_OTHER): Payer: BC Managed Care – PPO | Admitting: Psychology

## 2020-04-04 DIAGNOSIS — F319 Bipolar disorder, unspecified: Secondary | ICD-10-CM

## 2020-04-04 MED ORDER — OMEPRAZOLE 20 MG PO CPDR
20.0000 mg | DELAYED_RELEASE_CAPSULE | Freq: Every day | ORAL | 3 refills | Status: DC
Start: 2020-04-04 — End: 2020-05-08

## 2020-04-10 DIAGNOSIS — R05 Cough: Secondary | ICD-10-CM | POA: Diagnosis not present

## 2020-04-10 DIAGNOSIS — J329 Chronic sinusitis, unspecified: Secondary | ICD-10-CM | POA: Diagnosis not present

## 2020-04-10 DIAGNOSIS — R52 Pain, unspecified: Secondary | ICD-10-CM | POA: Diagnosis not present

## 2020-04-10 DIAGNOSIS — R6883 Chills (without fever): Secondary | ICD-10-CM | POA: Diagnosis not present

## 2020-04-18 ENCOUNTER — Ambulatory Visit: Payer: BC Managed Care – PPO | Admitting: Psychology

## 2020-04-18 DIAGNOSIS — J301 Allergic rhinitis due to pollen: Secondary | ICD-10-CM | POA: Diagnosis not present

## 2020-04-18 DIAGNOSIS — T781XXD Other adverse food reactions, not elsewhere classified, subsequent encounter: Secondary | ICD-10-CM | POA: Diagnosis not present

## 2020-04-18 DIAGNOSIS — J3089 Other allergic rhinitis: Secondary | ICD-10-CM | POA: Diagnosis not present

## 2020-04-19 DIAGNOSIS — E1165 Type 2 diabetes mellitus with hyperglycemia: Secondary | ICD-10-CM | POA: Diagnosis not present

## 2020-04-19 DIAGNOSIS — Z794 Long term (current) use of insulin: Secondary | ICD-10-CM | POA: Diagnosis not present

## 2020-05-02 ENCOUNTER — Ambulatory Visit (INDEPENDENT_AMBULATORY_CARE_PROVIDER_SITE_OTHER): Payer: BC Managed Care – PPO | Admitting: Psychology

## 2020-05-02 ENCOUNTER — Encounter (HOSPITAL_COMMUNITY): Payer: Self-pay | Admitting: Psychiatry

## 2020-05-02 ENCOUNTER — Other Ambulatory Visit: Payer: Self-pay

## 2020-05-02 ENCOUNTER — Telehealth (INDEPENDENT_AMBULATORY_CARE_PROVIDER_SITE_OTHER): Payer: BC Managed Care – PPO | Admitting: Psychiatry

## 2020-05-02 VITALS — Wt 230.0 lb

## 2020-05-02 DIAGNOSIS — F419 Anxiety disorder, unspecified: Secondary | ICD-10-CM

## 2020-05-02 DIAGNOSIS — F319 Bipolar disorder, unspecified: Secondary | ICD-10-CM

## 2020-05-02 DIAGNOSIS — F3162 Bipolar disorder, current episode mixed, moderate: Secondary | ICD-10-CM

## 2020-05-02 MED ORDER — ARIPIPRAZOLE 5 MG PO TABS: 5.0000 mg | ORAL_TABLET | Freq: Every evening | ORAL | 0 refills | Status: DC

## 2020-05-02 MED ORDER — LORAZEPAM 0.5 MG PO TABS: ORAL_TABLET | ORAL | 0 refills | Status: DC

## 2020-05-02 NOTE — Progress Notes (Signed)
Virtual Visit via Telephone Note  I connected with Christina [name] on 05/02/20 at  9:20 AM EDT by telephone and verified that I am speaking with the correct person using two identifiers.  Location: Patient: Work Provider: Biomedical scientist   I discussed the limitations, risks, security and privacy concerns of performing an evaluation and management service by telephone and the availability of in person appointments. I also discussed with the patient that there may be a patient responsible charge related to this service. The patient expressed understanding and agreed to proceed.   History of Present Illness: Patient is evaluated by phone session.  She admitted last Monday she is stressed and anxious.  She reported her 67-year-old son who has autism was sick last Monday and he called words "mommy"and school staff call her to pick him up.  Patient told it was very stressful.  He was throwing up and have a high-grade fever but COVID was negative.  She felt at that moment that she has responsibilities to take care of his son and that stressed him.  She is in a school and also working.  She admitted some ruminative thoughts but denies any paranoia, hallucination or any anger.  She is sleeping good.  She was thinking to talk to school but not sure since she has good grades.  She is in therapy with Leeann and that is going well.  She also reported taking her medication to help control her diabetes and her last hemoglobin A1c is actually dropped from 9.4-8.4.  She lost some weight.  She is more active.  She denies any highs and lows, mania, suicidal thoughts.  She has no tremors or shakes.  She is taking Abilify every day and she had a good support from her husband.  She is seeing endocrinologist at [facility] and happy with the care.     Past Psychiatric History: H/Odepression, anxiety, mania and psychosis. Admitted in 1997 at [facility] due to suicidal gestures. Seen inthis office since 2006. TriedProzac and  Wellbutrin.   Psychiatric Specialty Exam: Physical Exam  Review of Systems  Weight 230 lb (104.3 kg), unknown if currently breastfeeding.There is no height or weight on file to calculate BMI.  General Appearance: NA  Eye Contact:  NA  Speech:  Slow  Volume:  Decreased  Mood:  Anxious  Affect:  NA  Thought Process:  Goal Directed  Orientation:  Full (Time, Place, and Person)  Thought Content:  Rumination  Suicidal Thoughts:  No  Homicidal Thoughts:  No  Memory:  Immediate;   Good Recent;   Good Remote;   Good  Judgement:  Intact  Insight:  Present  Psychomotor Activity:  NA  Concentration:  Concentration: Fair and Attention Span: Fair  Recall:  Good  Fund of Knowledge:  Good  Language:  Good  Akathisia:  No  Handed:  Right  AIMS (if indicated):     Assets:  Communication Skills Desire for Improvement Housing Social Support Transportation  ADL's:  Intact  Cognition:  WNL  Sleep:   ok      Assessment and Plan: Bipolar disorder type I.  Anxiety.  Discussed the incident that caused stressed out.  Reassurance given.  Recommend that she has responsibility as a mother and also she is working and in school and she need to balance her responsibilities.  I encourage talk to [facility] about coping skills.  I recommend if she feels very stressed out and anxious then she can take lorazepam 0.5 mg half to  1 tablet as needed but avoid taking unless she can handle a situation on her own.  Discussed benzodiazepine dependence tolerance and withdrawal.  I reviewed blood work results.  Complement on her improving diabetes.  She does not want to change medication and we will continue Abilify 5 mg daily.  I recommend to call us back if she has any question, concern or if she feels worsening of the symptom.  Follow-up in 3 months.  Patient has appointment with Secundino Ginger this afternoon.   Follow Up Instructions:    I discussed the assessment and treatment plan with the patient. The patient was  provided an opportunity to ask questions and all were answered. The patient agreed with the plan and demonstrated an understanding of the instructions.   The patient was advised to call back or seek an in-person evaluation if the symptoms worsen or if the condition fails to improve as anticipated.  I provided 30 minutes of non-face-to-face time during this encounter.   Kathlee Nations, MD

## 2020-05-07 ENCOUNTER — Other Ambulatory Visit: Payer: Self-pay

## 2020-05-07 ENCOUNTER — Encounter: Payer: Self-pay | Admitting: Family Medicine

## 2020-05-07 ENCOUNTER — Ambulatory Visit: Payer: BC Managed Care – PPO | Admitting: Family Medicine

## 2020-05-07 VITALS — BP 120/82 | HR 98 | Resp 16 | Ht 65.0 in | Wt 233.0 lb

## 2020-05-07 DIAGNOSIS — I10 Essential (primary) hypertension: Secondary | ICD-10-CM

## 2020-05-07 DIAGNOSIS — D508 Other iron deficiency anemias: Secondary | ICD-10-CM | POA: Diagnosis not present

## 2020-05-07 DIAGNOSIS — E1159 Type 2 diabetes mellitus with other circulatory complications: Secondary | ICD-10-CM | POA: Diagnosis not present

## 2020-05-07 DIAGNOSIS — E559 Vitamin D deficiency, unspecified: Secondary | ICD-10-CM

## 2020-05-07 DIAGNOSIS — Z23 Encounter for immunization: Secondary | ICD-10-CM

## 2020-05-07 DIAGNOSIS — K219 Gastro-esophageal reflux disease without esophagitis: Secondary | ICD-10-CM

## 2020-05-07 DIAGNOSIS — Z1159 Encounter for screening for other viral diseases: Secondary | ICD-10-CM

## 2020-05-07 DIAGNOSIS — F319 Bipolar disorder, unspecified: Secondary | ICD-10-CM

## 2020-05-07 DIAGNOSIS — J302 Other seasonal allergic rhinitis: Secondary | ICD-10-CM

## 2020-05-07 MED ORDER — LOSARTAN POTASSIUM 50 MG PO TABS: 50.0000 mg | ORAL_TABLET | Freq: Every day | ORAL | 1 refills | Status: DC

## 2020-05-07 MED ORDER — ROSUVASTATIN CALCIUM 5 MG PO TABS: ORAL_TABLET | ORAL | 3 refills | Status: DC

## 2020-05-07 NOTE — Patient Instructions (Addendum)
F/U office with MD mid February, call if you need me sooner  Flu vaccine today  Please get fasting lipid, cmp and EGFr, hepatitis C screen, vit D and magnesium mid December  Congrats on weight loss and improving blood sugar , keep moving in this direction  Please ONLY use tylenol for pain,everything else may cause damage to kidneys  It is important that you exercise regularly at least 30 minutes 5 times a week. If you develop chest pain, have severe difficulty breathing, or feel very tired, stop exercising immediately and seek medical attention  Think about what you will eat, plan ahead. Choose " clean, green, fresh or frozen" over canned, processed or packaged foods which are more sugary, salty and fatty. 70 to 75% of food eaten should be vegetables and fruit. Three meals at set times with snacks allowed between meals, but they must be fruit or vegetables. Aim to eat over a 12 hour period , example 7 am to 7 pm, and STOP after  your last meal of the day. Drink water,generally about 64 ounces per day, no other drink is as healthy. Fruit juice is best enjoyed in a healthy way, by EATING the fruit. Thanks for choosing [facility], we consider it a privelige to serve you.    

## 2020-05-08 ENCOUNTER — Encounter: Payer: Self-pay | Admitting: Internal Medicine

## 2020-05-08 ENCOUNTER — Encounter: Payer: Self-pay | Admitting: Family Medicine

## 2020-05-08 ENCOUNTER — Ambulatory Visit: Payer: BC Managed Care – PPO | Admitting: Internal Medicine

## 2020-05-08 VITALS — BP 126/80 | HR 104 | Temp 97.1°F | Ht 65.0 in | Wt 231.6 lb

## 2020-05-08 DIAGNOSIS — K3184 Gastroparesis: Secondary | ICD-10-CM | POA: Diagnosis not present

## 2020-05-08 MED ORDER — ONDANSETRON HCL 4 MG PO TABS: 4.0000 mg | ORAL_TABLET | Freq: Three times a day (TID) | ORAL | 1 refills | Status: AC | PRN

## 2020-05-08 MED ORDER — OMEPRAZOLE 40 MG PO CPDR: 40.0000 mg | DELAYED_RELEASE_CAPSULE | Freq: Two times a day (BID) | ORAL | 5 refills | Status: DC

## 2020-05-08 NOTE — Assessment & Plan Note (Signed)
Controlled, no change in medication DASH diet and commitment to daily physical activity for a minimum of 30 minutes discussed and encouraged, as a part of hypertension management. The importance of attaining a healthy weight is also discussed.  BP/Weight 05/07/2020 01/04/2020 09/15/2019 08/24/2019 08/15/2019 06/13/2019 70/08/7492  Systolic BP 496 759 163 846 659 935 701  Diastolic BP 82 90 83 82 84 88 84  Wt. (Lbs) 233 238 238 243.8 239.4 232 231  BMI 38.77 39.61 39.61 40.57 39.84 38.61 39.04  Some encounter information is confidential and restricted. Go to Review Flowsheets activity to see all data.

## 2020-05-08 NOTE — Progress Notes (Signed)
Referring Provider: [name], MD Primary Care Physician:  [name], MD Primary GI:  Dr. Abbey Chatters  Chief Complaint  Patient presents with  . Diarrhea    twice a week. Takes ozempic on _0 94  . TRIGGER FINGER RELEASE Bilateral 06/2015  . TUBAL LIGATION Bilateral 03/06/2016   Procedure: BILATERAL TUBAL LIGATION;  Surgeon: Ena Dawley, MD;  Location: [place];  Service: Obstetrics;  Laterality: Bilateral;    Current Outpatient Medications  Medication Sig Dispense Refill  . acetaminophen (TYLENOL) 500 MG tablet Take 1,000 mg every 8 (eight) hours as needed by mouth for mild pain.     . ARIPiprazole (ABILIFY) 5 MG tablet Take 1 tablet (5 mg total) by mouth every evening. 90 tablet 0  . B-D UF III MINI PEN NEEDLES 31G X 5 MM MISC USE AS DIRECTED 100 each 0  . Coenzyme Q10 (CO Q-10) 300 MG CAPS Take by mouth. Once daily    . Cranberry 425 MG CAPS Take 2 capsules by mouth in the morning and at bedtime.    . Flaxseed, Linseed, (FLAX SEED OIL) 1000 MG CAPS Take 1 capsule by mouth daily.    . fluticasone (FLONASE) 50 MCG/ACT nasal spray Place  2 sprays into both nostrils daily. 48 mL 5  . glucose blood test strip Use as instructed, three times daily 300 each 12  . Insulin Glargine (LANTUS) 100 UNIT/ML Solostar Pen Inject 50 Units into the skin at bedtime. 15 mL 11  . levocetirizine (XYZAL) 5 MG tablet Take 5 mg by mouth every evening.    Marland Kitchen LORazepam (ATIVAN) 0.5 MG tablet Take 1/2 to one tab as needed for severe anxiety 5 tablet 0  . losartan (COZAAR) 50 MG tablet Take 1 tablet (50 mg total) by mouth daily. 90 tablet 1  . Magnesium 200 MG TABS Take 1 tablet by mouth daily.    . metFORMIN (GLUCOPHAGE) 1000 MG tablet Take 1  tablet (1,000 mg total) by mouth 2 (two) times daily with a meal. 180 tablet 3  . montelukast (SINGULAIR) 10 MG tablet Take 1 tablet (10 mg total) by mouth at bedtime. 90 tablet 3  . omeprazole (PRILOSEC) 40 MG capsule Take 1 capsule (40 mg total) by mouth 2 (two) times daily before a meal. 60 capsule 5  . OZEMPIC, 1 MG/DOSE, 2 MG/1.5ML SOPN Inject 1 mg into the skin once a week.    . rosuvastatin (CRESTOR) 5 MG tablet Take one tablet by mouth three times weekly 36 tablet 3  . sodium chloride (OCEAN) 0.65 % SOLN nasal spray Place 1 spray into both nostrils 4 (four) times daily as needed for congestion.    Glory Rosebush DELICA LANCETS 80D MISC Three times daily testing dx e11.9 300 each 5   No current facility-administered medications for this visit.    Allergies as of 05/08/2020 - Review Complete 05/08/2020  Allergen Reaction Noted  . Other Anaphylaxis 06/13/2013  . 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 Peanut-containing drug products Anaphylaxis 02/08/2013  . Augmentin [amoxicillin-pot clavulanate] Nausea And Vomiting 04/07/2013    Family History  Problem Relation Age of Onset  . Diabetes Mother   . Hypertension Mother   . Fibromyalgia Mother   . Stroke Mother   . Diabetes Father   . Hypertension Father   . Asthma Father   . Heart disease Father   . Depression Father   . Colon cancer Neg Hx     Social History   Socioeconomic History  . Marital status: Married    Spouse name: Not on file  . Number of children: Not on file  . Years of education: Not on file  . Highest education level: Not on file  Occupational History  . Not on file  Tobacco Use  . Smoking status: Never Smoker  . Smokeless tobacco: Never Used  . Tobacco comment: not everyday  Vaping Use  . Vaping Use: Never used  Substance and Sexual Activity  . Alcohol use: Yes    Alcohol/week: 0.0 standard drinks    Comment: occ  . Drug use: No  . Sexual activity: Yes    Partners: Male    Birth control/protection:  Surgical  Other Topics Concern  . Not on file  Social History Narrative  . Not on file   Social Determinants of Health   Financial Resource Strain:   . Difficulty of Paying Living Expenses: Not on file  Food Insecurity:   . Worried About Charity fundraiser in the Last Year: Not on file  . Ran Out of Food in the Last Year: Not on file  Transportation Needs:   . Lack of Transportation (Medical): Not on file  . Lack of Transportation (Non-Medical): Not on file  Physical Activity:   . Days of Exercise per Week: Not on file  . Minutes of Exercise per Session: Not on file  Stress:   . Feeling of Stress : Not on file  Social Connections:   . Frequency of Communication with Friends and Family: Not on file  . Frequency of Social Gatherings with Friends and Family: Not on file  . Attends Religious Services: Not on file  . Active Member of Clubs or Organizations: Not on file  . Attends Archivist Meetings: Not on file  . Marital Status: Not on file    Subjective: Review of Systems  Constitutional: Negative for chills and fever.  HENT: Negative for congestion and hearing loss.   Eyes: Negative for blurred vision and double vision.  Respiratory: Negative for cough and shortness of breath.   Cardiovascular: Negative for chest pain and palpitations.  Gastrointestinal: Positive for diarrhea and nausea. Negative for abdominal pain, blood in stool, constipation, heartburn, melena and vomiting.  Genitourinary: Negative for dysuria and urgency.  Musculoskeletal: Negative for joint pain and myalgias.  Skin: Negative for itching and rash.  Neurological: Negative for dizziness and headaches.  Psychiatric/Behavioral: Negative for depression. The patient is not nervous/anxious.      Objective: BP 126/80   Pulse (!) 104   Temp (!) 97.1 F (36.2 C) (Oral)   Ht _0  (1.651 m)   Wt 231 lb 9.6 oz (105.1 kg)   LMP 04/16/2020   BMI 38.54 kg/m  Physical Exam Constitutional:       Appearance: Normal appearance. She is obese.  HENT:     Head: Normocephalic and atraumatic.  Eyes:     Extraocular Movements: Extraocular movements intact.     Conjunctiva/sclera: Conjunctivae normal.  Cardiovascular:     Rate and Rhythm: Normal rate and regular rhythm.  Pulmonary:     Effort: Pulmonary effort is normal.     Breath sounds: Normal breath sounds.  Abdominal:     General: Bowel sounds are normal.     Palpations: Abdomen is soft.  Musculoskeletal:        General: No swelling. Normal range of motion.     Cervical back: Normal range of motion and neck supple.  Skin:    General: Skin is warm and dry.     Coloration: Skin is not jaundiced.  Neurological:     General: No focal deficit present.     Mental Status: She is alert and oriented to person, place, and time.  Psychiatric:        Mood and Affect: Mood normal.        Behavior: Behavior normal.      Assessment: *Gastroparesis *Nausea with occasional vomiting *GERD-worsening *Diarrhea  Plan: Patient appears to be having side effects from her new medication Ozempic.  Hopefully over time these will improve.  In the meantime I will increase her omeprazole to 40 mg twice daily for the next 8 weeks and then decrease back to once daily thereafter.  I will also send in as needed Zofran for nausea.  Patient to follow-up with her endocrinologist regarding the [place].  I will see her back in 6 months or sooner if needed.  05/08/2020 10:48 AM   Disclaimer: This note was dictated with voice recognition software. Similar sounding words can inadvertently be transcribed and may not be corrected upon review.

## 2020-05-08 NOTE — Assessment & Plan Note (Signed)
  Patient re-educated about  the importance of commitment to a  minimum of 150 minutes of exercise per week as able.  The importance of healthy food choices with portion control discussed, as well as eating regularly and within a 12 hour window most days. The need to choose "clean , green" food 50 to 75% of the time is discussed, as well as to make water the primary drink and set a goal of 64 ounces water daily.    Weight /BMI 05/07/2020 01/04/2020 09/15/2019  WEIGHT 233 lb 238 lb 238 lb  HEIGHT 5\' 5"  5\' 5"  5\' 5"   BMI 38.77 kg/m2 39.61 kg/m2 39.61 kg/m2  Some encounter information is confidential and restricted. Go to Review Flowsheets activity to see all data.

## 2020-05-08 NOTE — Assessment & Plan Note (Signed)
Controlled, no change in medication Followed by GI 

## 2020-05-08 NOTE — Assessment & Plan Note (Signed)
Managed by Psychiatry and stable and controlled

## 2020-05-08 NOTE — Patient Instructions (Signed)
I will increase your omeprazole to 40 mg twice daily.  After 8 weeks you can decrease this back to once daily.  Take 30 minutes before breakfast and 1 dose at night.  I would recommend eating 6 small meals daily versus 3 large meals.  I have sent in Zofran to take as needed for nausea.  Follow-up in 6 months or sooner if needed.  At [facility] we value your feedback. You may receive a survey about your visit today. Please share your experience as we strive to create trusting relationships with our patients to provide genuine, compassionate, quality care.  We appreciate your understanding and patience as we review any laboratory studies, imaging, and other diagnostic tests that are ordered as we care for you. Our office policy is 5 business days for review of these results, and any emergent or urgent results are addressed in a timely manner for your best interest. If you do not hear from our office in 1 week, please contact us.   We also encourage the use of MyChart, which contains your medical information for your review as well. If you are not enrolled in this feature, an access code is on this after visit summary for your convenience. Thank you for allowing [place] to be involved in your care.  It was great to see you today!  I hope you have a great rest of your fall!!    Earon Rivest K. Abbey Chatters, D.O. Gastroenterology and Hepatology [facility]

## 2020-05-08 NOTE — Progress Notes (Signed)
Christina [name]     MRN: [id number]      DOB: [birth year]   HPI Christina [name] is here for follow up and re-evaluation of chronic medical conditions, medication management and review of any available recent lab and radiology data.  Preventive health is updated, specifically  Cancer screening and Immunization.   Questions or concerns regarding consultations or procedures which the PT has had in the interim are  addressed. The PT denies any adverse reactions to current medications since the last visit.  Increased stress from school and work but has reached out for help from Psych with good result Improving blood sugar, and will start regulkalrly walking at lunchtime Denies polyuria, polydipsia, blurred vision , or hypoglycemic episodes.    ROS Denies recent fever or chills. Denies sinus pressure, nasal congestion, ear pain or sore throat. Denies chest congestion, productive cough or wheezing. Denies chest pains, palpitations and leg swelling Denies abdominal pain, nausea, vomiting,diarrhea or constipation.   Denies dysuria, frequency, hesitancy or incontinence. Denies joint pain, swelling and limitation in mobility. Denies headaches, seizures, numbness, or tingling. Denies depression, c/o increased  anxiety denies  insomnia. Denies skin break down or rash.   PE  BP 120/82   Pulse 98   Resp 16   Ht 5\' 5"  (1.651 m)   Wt 233 lb (105.7 kg)   SpO2 97%   BMI 38.77 kg/m   Patient alert and oriented and in no cardiopulmonary distress.  HEENT: No facial asymmetry, EOMI,     Neck supple .  Chest: Clear to auscultation bilaterally.  CVS: S1, S2 no murmurs, no S3.Regular rate.  ABD: Soft non tender.   Ext: No edema  MS: Adequate ROM spine, shoulders, hips and knees.  Skin: Intact, no ulcerations or rash noted.  Psych: Good eye contact, normal affect. Memory intact not anxious or depressed appearing.  CNS: CN 2-12 intact, power,  normal throughout.no focal deficits  noted.   Assessment & Plan  Essential hypertension, benign Controlled, no change in medication DASH diet and commitment to daily physical activity for a minimum of 30 minutes discussed and encouraged, as a part of hypertension management. The importance of attaining a healthy weight is also discussed.  BP/Weight 05/07/2020 01/04/2020 09/15/2019 08/24/2019 08/15/2019 06/13/2019 30/86/5784  Systolic BP 696 295 284 132 440 102 725  Diastolic BP 82 90 83 82 84 88 84  Wt. (Lbs) 233 238 238 243.8 239.4 232 231  BMI 38.77 39.61 39.61 40.57 39.84 38.61 39.04  Some encounter information is confidential and restricted. Go to Review Flowsheets activity to see all data.       Type 2 diabetes mellitus with vascular disease ([place]) Improving , managed by Endo, still not at goal Christina [name] is reminded of the importance of commitment to daily physical activity for 30 minutes or more, as able and the need to limit carbohydrate intake to 30 to 60 grams per meal to help with blood sugar control.   The need to take medication as prescribed, test blood sugar as directed, and to call between visits if there is a concern that blood sugar is uncontrolled is also discussed.   Christina [name] is reminded of the importance of daily foot exam, annual eye examination, and good blood sugar, blood pressure and cholesterol control.  Diabetic Labs Latest Ref Rng & Units 01/12/2020 12/22/2019 06/02/2019 02/01/2019 11/25/2018  HbA1c - - 9.4 7.5(H) - 9.6(H)  Microalbumin Not Estab. ug/mL - - - - -  Micro/Creat Ratio 0 -  29 mg/g creat - - 42(H) - -  Chol 100 - 199 mg/dL 124 - 157 - -  HDL >39 mg/dL 66 - 58 - -  Calc LDL 0 - 99 mg/dL 41 - 78 - -  Triglycerides 0 - 149 mg/dL 88 - 118 - -  Creatinine 0.57 - 1.00 mg/dL 0.83 - 1.11(H) 0.85 0.83   BP/Weight 05/07/2020 01/04/2020 09/15/2019 08/24/2019 08/15/2019 06/13/2019 04/88/8916  Systolic BP 945 038 882 800 349 179 150  Diastolic BP 82 90 83 82 84 88 84  Wt. (Lbs) 233 238 238 243.8 239.4  232 231  BMI 38.77 39.61 39.61 40.57 39.84 38.61 39.04  Some encounter information is confidential and restricted. Go to Review Flowsheets activity to see all data.   Foot/eye exam completion dates Latest Ref Rng & Units 05/07/2020 02/14/2020  Eye Exam No Retinopathy - No Retinopathy  Foot Form Completion - Done -        Seasonal allergies Improved control with immunotherapy, continue same  Bipolar 1 disorder Managed by Psychiatry and stable and controlled   Morbid obesity ([place])  Patient re-educated about  the importance of commitment to a  minimum of 150 minutes of exercise per week as able.  The importance of healthy food choices with portion control discussed, as well as eating regularly and within a 12 hour window most days. The need to choose "clean , green" food 50 to 75% of the time is discussed, as well as to make water the primary drink and set a goal of 64 ounces water daily.    Weight /BMI 05/07/2020 01/04/2020 09/15/2019  WEIGHT 233 lb 238 lb 238 lb  HEIGHT 5\' 5"  5\' 5"  5\' 5"   BMI 38.77 kg/m2 39.61 kg/m2 39.61 kg/m2  Some encounter information is confidential and restricted. Go to Review Flowsheets activity to see all data.      GERD Controlled, no change in medication Followed by GI

## 2020-05-08 NOTE — Assessment & Plan Note (Signed)
Improving , managed by Endo, still not at goal Christina [name] is reminded of the importance of commitment to daily physical activity for 30 minutes or more, as able and the need to limit carbohydrate intake to 30 to 60 grams per meal to help with blood sugar control.   The need to take medication as prescribed, test blood sugar as directed, and to call between visits if there is a concern that blood sugar is uncontrolled is also discussed.   Christina [name] is reminded of the importance of daily foot exam, annual eye examination, and good blood sugar, blood pressure and cholesterol control.  Diabetic Labs Latest Ref Rng & Units 01/12/2020 12/22/2019 06/02/2019 02/01/2019 11/25/2018  HbA1c - - 9.4 7.5(H) - 9.6(H)  Microalbumin Not Estab. ug/mL - - - - -  Micro/Creat Ratio 0 - 29 mg/g creat - - 42(H) - -  Chol 100 - 199 mg/dL 124 - 157 - -  HDL >39 mg/dL 66 - 58 - -  Calc LDL 0 - 99 mg/dL 41 - 78 - -  Triglycerides 0 - 149 mg/dL 88 - 118 - -  Creatinine 0.57 - 1.00 mg/dL 0.83 - 1.11(H) 0.85 0.83   BP/Weight 05/07/2020 01/04/2020 09/15/2019 08/24/2019 08/15/2019 06/13/2019 09/40/7680  Systolic BP 881 103 159 458 592 924 462  Diastolic BP 82 90 83 82 84 88 84  Wt. (Lbs) 233 238 238 243.8 239.4 232 231  BMI 38.77 39.61 39.61 40.57 39.84 38.61 39.04  Some encounter information is confidential and restricted. Go to Review Flowsheets activity to see all data.   Foot/eye exam completion dates Latest Ref Rng & Units 05/07/2020 02/14/2020  Eye Exam No Retinopathy - No Retinopathy  Foot Form Completion - Done -

## 2020-05-08 NOTE — Assessment & Plan Note (Signed)
Improved control with immunotherapy, continue same

## 2020-05-15 ENCOUNTER — Ambulatory Visit (INDEPENDENT_AMBULATORY_CARE_PROVIDER_SITE_OTHER): Payer: BC Managed Care – PPO | Admitting: Psychology

## 2020-05-15 DIAGNOSIS — F319 Bipolar disorder, unspecified: Secondary | ICD-10-CM

## 2020-05-30 ENCOUNTER — Ambulatory Visit (INDEPENDENT_AMBULATORY_CARE_PROVIDER_SITE_OTHER): Payer: BC Managed Care – PPO | Admitting: Psychology

## 2020-05-30 DIAGNOSIS — F319 Bipolar disorder, unspecified: Secondary | ICD-10-CM

## 2020-06-13 ENCOUNTER — Ambulatory Visit (INDEPENDENT_AMBULATORY_CARE_PROVIDER_SITE_OTHER): Payer: BC Managed Care – PPO | Admitting: Psychology

## 2020-06-13 DIAGNOSIS — F319 Bipolar disorder, unspecified: Secondary | ICD-10-CM

## 2020-06-26 ENCOUNTER — Other Ambulatory Visit: Payer: Self-pay | Admitting: Family Medicine

## 2020-07-01 ENCOUNTER — Ambulatory Visit: Payer: BC Managed Care – PPO | Admitting: Family Medicine

## 2020-07-01 ENCOUNTER — Encounter: Payer: Self-pay | Admitting: Family Medicine

## 2020-07-01 ENCOUNTER — Other Ambulatory Visit: Payer: Self-pay

## 2020-07-01 ENCOUNTER — Ambulatory Visit: Payer: Self-pay | Admitting: Podiatry

## 2020-07-01 VITALS — BP 125/86 | HR 90 | Temp 98.4°F | Resp 16 | Ht 64.5 in | Wt 226.0 lb

## 2020-07-01 DIAGNOSIS — F319 Bipolar disorder, unspecified: Secondary | ICD-10-CM

## 2020-07-01 DIAGNOSIS — J029 Acute pharyngitis, unspecified: Secondary | ICD-10-CM

## 2020-07-01 DIAGNOSIS — J0110 Acute frontal sinusitis, unspecified: Secondary | ICD-10-CM

## 2020-07-01 DIAGNOSIS — I10 Essential (primary) hypertension: Secondary | ICD-10-CM | POA: Diagnosis not present

## 2020-07-01 DIAGNOSIS — E1159 Type 2 diabetes mellitus with other circulatory complications: Secondary | ICD-10-CM

## 2020-07-01 LAB — POCT RAPID STREP A (OFFICE): Rapid Strep A Screen: NEGATIVE

## 2020-07-01 MED ORDER — FLUCONAZOLE 150 MG PO TABS: 150.0000 mg | ORAL_TABLET | Freq: Once | ORAL | 0 refills | Status: AC

## 2020-07-01 MED ORDER — AZITHROMYCIN 250 MG PO TABS: ORAL_TABLET | ORAL | 0 refills | Status: DC

## 2020-07-01 NOTE — Patient Instructions (Addendum)
Follow-up as before call if you need me sooner.  You are treated for frontal sinusitis.  Azithromycin for 5 days and 1 fluconazole tablet is prescribed.  Use Tylenol for headache or fever.  Work excuse from today to return on December 1.  Your rapid strep test is negative.   Right ear drum is occluded by wax, left is normal   Sinusitis, Adult Sinusitis is soreness and swelling (inflammation) of your sinuses. Sinuses are hollow spaces in the bones around your face. They are located:  Around your eyes.  In the middle of your forehead.  Behind your nose.  In your cheekbones. Your sinuses and nasal passages are lined with a fluid called mucus. Mucus drains out of your sinuses. Swelling can trap mucus in your sinuses. This lets germs (bacteria, virus, or fungus) grow, which leads to infection. Most of the time, this condition is caused by a virus. What are the causes? This condition is caused by:  Allergies.  Asthma.  Germs.  Things that block your nose or sinuses.  Growths in the nose (nasal polyps).  Chemicals or irritants in the air.  Fungus (rare). What increases the risk? You are more likely to develop this condition if:  You have a weak body defense system (immune system).  You do a lot of swimming or diving.  You use nasal sprays too much.  You smoke. What are the signs or symptoms? The main symptoms of this condition are pain and a feeling of pressure around the sinuses. Other symptoms include:  Stuffy nose (congestion).  Runny nose (drainage).  Swelling and warmth in the sinuses.  Headache.  Toothache.  A cough that may get worse at night.  Mucus that collects in the throat or the back of the nose (postnasal drip).  Being unable to smell and taste.  Being very tired (fatigue).  A fever.  Sore throat.  Bad breath. How is this diagnosed? This condition is diagnosed based on:  Your symptoms.  Your medical history.  A physical  exam.  Tests to find out if your condition is short-term (acute) or long-term (chronic). Your doctor may: ? Check your nose for growths (polyps). ? Check your sinuses using a tool that has a light (endoscope). ? Check for allergies or germs. ? Do imaging tests, such as an MRI or CT scan. How is this treated? Treatment for this condition depends on the cause and whether it is short-term or long-term.  If caused by a virus, your symptoms should go away on their own within 10 days. You may be given medicines to relieve symptoms. They include: ? Medicines that shrink swollen tissue in the nose. ? Medicines that treat allergies (antihistamines). ? A spray that treats swelling of the nostrils. ? Rinses that help get rid of thick mucus in your nose (nasal saline washes).  If caused by bacteria, your doctor may wait to see if you will get better without treatment. You may be given antibiotic medicine if you have: ? A very bad infection. ? A weak body defense system.  If caused by growths in the nose, you may need to have surgery. Follow these instructions at home: Medicines  Take, use, or apply over-the-counter and prescription medicines only as told by your doctor. These may include nasal sprays.  If you were prescribed an antibiotic medicine, take it as told by your doctor. Do not stop taking the antibiotic even if you start to feel better. Hydrate and humidify   Drink enough  water to keep your pee (urine) pale yellow.  Use a cool mist humidifier to keep the humidity level in your home above 50%.  Breathe in steam for 10-15 minutes, 3-4 times a day, or as told by your doctor. You can do this in the bathroom while a hot shower is running.  Try not to spend time in cool or dry air. Rest  Rest as much as you can.  Sleep with your head raised (elevated).  Make sure you get enough sleep each night. General instructions   Put a warm, moist washcloth on your face 3-4 times a day,  or as often as told by your doctor. This will help with discomfort.  Wash your hands often with soap and water. If there is no soap and water, use hand sanitizer.  Do not smoke. Avoid being around people who are smoking (secondhand smoke).  Keep all follow-up visits as told by your doctor. This is important. Contact a doctor if:  You have a fever.  Your symptoms get worse.  Your symptoms do not get better within 10 days. Get help right away if:  You have a very bad headache.  You cannot stop throwing up (vomiting).  You have very bad pain or swelling around your face or eyes.  You have trouble seeing.  You feel confused.  Your neck is stiff.  You have trouble breathing. Summary  Sinusitis is swelling of your sinuses. Sinuses are hollow spaces in the bones around your face.  This condition is caused by tissues in your nose that become inflamed or swollen. This traps germs. These can lead to infection.  If you were prescribed an antibiotic medicine, take it as told by your doctor. Do not stop taking it even if you start to feel better.  Keep all follow-up visits as told by your doctor. This is important. This information is not intended to replace advice given to you by your health care provider. Make sure you discuss any questions you have with your health care provider. Document Revised: 12/20/2017 Document Reviewed: 12/20/2017 Elsevier Patient Education  [place].  Sinus Headache  A sinus headache occurs when your sinuses become clogged or swollen. Sinuses are air-filled spaces in your skull that are behind the bones of your face and forehead. Sinus headaches can range from mild to severe. What are the causes? A sinus headache can result from various conditions that affect the sinuses. Common causes include:  Colds.  Sinus infections.  Allergies. Many people confuse sinus headaches with migraines or tension headaches because those headaches can also  cause facial pain and nasal symptoms. What are the signs or symptoms? The main symptom of this condition is a headache that may feel like pain or pressure in your face, forehead, ears, or upper teeth. People who have a sinus headache often have other symptoms, such as:  Congested or runny nose.  Fever.  Inability to smell. Weather changes can make symptoms worse. How is this diagnosed? This condition may be diagnosed based on:  A physical exam and medical history.  Imaging tests, such as a CT scan or MRI, to check for problems with the sinuses.  Examination of the sinuses using a thin tool with a camera that is inserted through your nose (endoscopy). How is this treated? Treatment for this condition depends on the cause.  Sinus pain that is caused by a sinus infection may be treated with antibiotic medicine.  Sinus pain that is caused by allergies may  be helped by allergy medicines (antihistamines) and medicated nasal sprays.  Sinus pain that is caused by congestion may be helped by rinsing out (flushing) the nose and sinuses with saline solution.  Sinus surgery may be needed in some cases if other treatments do not help. Follow these instructions at home: General instructions  If directed: ? Apply a warm, moist washcloth to your face to help relieve pain. ? Use a nasal saline wash. Medicines   Take over-the-counter and prescription medicines only as told by your health care provider.  If you were prescribed an antibiotic medicine, take it as told by your health care provider. Do not stop taking the antibiotic even if you start to feel better.  If you have congestion, use a nasal spray to help lessen pressure. Hydrate and humidify  Drink enough water to keep your urine clear or pale yellow. Staying hydrated will help to thin your mucus.  Use a cool mist humidifier to keep the humidity level in your home above 50%.  Inhale steam for 10-15 minutes, 3-4 times a day or as  told by your health care provider. You can do this in the bathroom while a hot shower is running.  Limit your exposure to cool or dry air. Contact a health care provider if:  You have a headache more than one time a week.  You have sensitivity to light or sound.  You develop a fever.  You feel nauseous or you vomit.  Your headaches do not get better with treatment. Many people think that they have a sinus headache when they actually have a migraine or a tension headache. Get help right away if:  You have vision problems.  You have sudden, severe pain in your face or head.  You have a seizure.  You are confused.  You have a stiff neck. Summary  A sinus headache occurs when your sinuses become clogged or swollen.  A sinus headache can result from various conditions that affect the sinuses, such as a cold, a sinus infection, or an allergy.  Treatment for this condition depends on the cause. It may include medicine, such as antibiotics or antihistamines. This information is not intended to replace advice given to you by your health care provider. Make sure you discuss any questions you have with your health care provider. Document Revised: 07/02/2017 Document Reviewed: 04/30/2017 Elsevier Patient Education  2020 [employer].

## 2020-07-02 ENCOUNTER — Ambulatory Visit (INDEPENDENT_AMBULATORY_CARE_PROVIDER_SITE_OTHER): Payer: BC Managed Care – PPO | Admitting: Psychology

## 2020-07-02 DIAGNOSIS — F319 Bipolar disorder, unspecified: Secondary | ICD-10-CM

## 2020-07-07 ENCOUNTER — Encounter: Payer: Self-pay | Admitting: Family Medicine

## 2020-07-07 NOTE — Assessment & Plan Note (Signed)
  Patient re-educated about  the importance of commitment to a  minimum of 150 minutes of exercise per week as able.  The importance of healthy food choices with portion control discussed, as well as eating regularly and within a 12 hour window most days. The need to choose "clean , green" food 50 to 75% of the time is discussed, as well as to make water the primary drink and set a goal of 64 ounces water daily.    Weight /BMI 07/01/2020 05/08/2020 05/07/2020  WEIGHT 226 lb 0.6 oz 231 lb 9.6 oz 233 lb  HEIGHT 5' 4.5" 5\' 5"  5\' 5"   BMI 38.2 kg/m2 38.54 kg/m2 38.77 kg/m2  Some encounter information is confidential and restricted. Go to Review Flowsheets activity to see all data.

## 2020-07-07 NOTE — Assessment & Plan Note (Signed)
Rapid strep negative.

## 2020-07-07 NOTE — Assessment & Plan Note (Signed)
z pack prescribed 

## 2020-07-07 NOTE — Assessment & Plan Note (Signed)
Controlled, no change in medication DASH diet and commitment to daily physical activity for a minimum of 30 minutes discussed and encouraged, as a part of hypertension management. The importance of attaining a healthy weight is also discussed.  BP/Weight 07/01/2020 05/08/2020 05/07/2020 01/04/2020 09/15/2019 08/24/2019 04/16/4457  Systolic BP 483 507 573 225 672 091 980  Diastolic BP 86 80 82 90 83 82 84  Wt. (Lbs) 226.04 231.6 233 238 238 243.8 239.4  BMI 38.2 38.54 38.77 39.61 39.61 40.57 39.84  Some encounter information is confidential and restricted. Go to Review Flowsheets activity to see all data.

## 2020-07-07 NOTE — Assessment & Plan Note (Signed)
Stable, treated by psychiatry 

## 2020-07-07 NOTE — Assessment & Plan Note (Signed)
Christina [name] is reminded of the importance of commitment to daily physical activity for 30 minutes or more, as able and the need to limit carbohydrate intake to 30 to 60 grams per meal to help with blood sugar control.   The need to take medication as prescribed, test blood sugar as directed, and to call between visits if there is a concern that blood sugar is uncontrolled is also discussed.   Christina [name] is reminded of the importance of daily foot exam, annual eye examination, and good blood sugar, blood pressure and cholesterol control.  Diabetic Labs Latest Ref Rng & Units 01/12/2020 12/22/2019 06/02/2019 02/01/2019 11/25/2018  HbA1c - - 9.4 7.5(H) - 9.6(H)  Microalbumin Not Estab. ug/mL - - - - -  Micro/Creat Ratio 0 - 29 mg/g creat - - 42(H) - -  Chol 100 - 199 mg/dL 124 - 157 - -  HDL >39 mg/dL 66 - 58 - -  Calc LDL 0 - 99 mg/dL 41 - 78 - -  Triglycerides 0 - 149 mg/dL 88 - 118 - -  Creatinine 0.57 - 1.00 mg/dL 0.83 - 1.11(H) 0.85 0.83   BP/Weight 07/01/2020 05/08/2020 05/07/2020 01/04/2020 09/15/2019 08/24/2019 02/06/2375  Systolic BP 283 151 761 607 371 062 694  Diastolic BP 86 80 82 90 83 82 84  Wt. (Lbs) 226.04 231.6 233 238 238 243.8 239.4  BMI 38.2 38.54 38.77 39.61 39.61 40.57 39.84  Some encounter information is confidential and restricted. Go to Review Flowsheets activity to see all data.   Foot/eye exam completion dates Latest Ref Rng & Units 05/07/2020 02/14/2020  Eye Exam No Retinopathy - No Retinopathy  Foot Form Completion - Done -

## 2020-07-07 NOTE — Progress Notes (Signed)
Christina [name]     MRN: [id number]      DOB: 14-Jan-1976   HPI [name] is here for follow up and re-evaluation of chronic medical conditions, medication management and review of any available recent lab and radiology data.  Preventive health is updated, specifically  Cancer screening and Immunization.   Questions or concerns regarding consultations or procedures which the PT has had in the interim are  addressed. The PT denies any adverse reactions to current medications since the last visit.  2 day h/o frontal pressure with yellow drainage and fever, also c/o bilateral ear pain and sore throat. No other family member is sick Denies polyuria, polydipsia, blurred vision , or hypoglycemic episodes.  ROS  Denies chest congestion, productive cough or wheezing. Denies chest pains, palpitations and leg swelling Denies abdominal pain, nausea, vomiting,diarrhea or constipation.   Denies dysuria, frequency, hesitancy or incontinence. Denies joint pain, swelling and limitation in mobility. Denies headaches, seizures, numbness, or tingling. Denies depression, anxiety or insomnia. Denies skin break down or rash.   PE  BP 125/86   Pulse 90   Temp 98.4 F (36.9 C) (Oral)   Resp 16   Ht 5' 4.5" (1.638 m)   Wt 226 lb 0.6 oz (102.5 kg)   SpO2 99%   BMI 38.20 kg/m   Patient alert and oriented and in no cardiopulmonary distress.  HEENT: No facial asymmetry, EOMI,     Neck supple .Frontal sinus tenderness, TM clear left  , right  TM totally occluded . No  Erythema or exudate seen on throat  Chest: Clear to auscultation bilaterally.  CVS: S1, S2 no murmurs, no S3.Regular rate.  ABD: Soft non tender.   Ext: No edema  MS: Adequate ROM spine, shoulders, hips and knees.  Skin: Intact, no ulcerations or rash noted.  Psych: Good eye contact, normal affect. Memory intact not anxious or depressed appearing.  CNS: CN 2-12 intact, power,  normal throughout.no focal deficits  noted.   Assessment & Plan  Sore throat Rapid strep negative  Frontal sinusitis z pack prescribed  Essential hypertension, benign Controlled, no change in medication DASH diet and commitment to daily physical activity for a minimum of 30 minutes discussed and encouraged, as a part of hypertension management. The importance of attaining a healthy weight is also discussed.  BP/Weight 07/01/2020 05/08/2020 05/07/2020 01/04/2020 09/15/2019 08/24/2019 11/09/8117  Systolic BP 147 829 562 130 865 784 696  Diastolic BP 86 80 82 90 83 82 84  Wt. (Lbs) 226.04 231.6 233 238 238 243.8 239.4  BMI 38.2 38.54 38.77 39.61 39.61 40.57 39.84  Some encounter information is confidential and restricted. Go to Review Flowsheets activity to see all data.       Morbid obesity ([place])  Patient re-educated about  the importance of commitment to a  minimum of 150 minutes of exercise per week as able.  The importance of healthy food choices with portion control discussed, as well as eating regularly and within a 12 hour window most days. The need to choose "clean , green" food 50 to 75% of the time is discussed, as well as to make water the primary drink and set a goal of 64 ounces water daily.    Weight /BMI 07/01/2020 05/08/2020 05/07/2020  WEIGHT 226 lb 0.6 oz 231 lb 9.6 oz 233 lb  HEIGHT 5' 4.5" 5\' 5"  5\' 5"   BMI 38.2 kg/m2 38.54 kg/m2 38.77 kg/m2  Some encounter information is confidential and restricted. Go to  Review Flowsheets activity to see all data.      Bipolar 1 disorder Stable , treated by psychiatry  Type 2 diabetes mellitus with vascular disease ([place]) [name] is reminded of the importance of commitment to daily physical activity for 30 minutes or more, as able and the need to limit carbohydrate intake to 30 to 60 grams per meal to help with blood sugar control.   The need to take medication as prescribed, test blood sugar as directed, and to call between visits if there is a concern that  blood sugar is uncontrolled is also discussed.   [name] is reminded of the importance of daily foot exam, annual eye examination, and good blood sugar, blood pressure and cholesterol control.  Diabetic Labs Latest Ref Rng & Units 01/12/2020 12/22/2019 06/02/2019 02/01/2019 11/25/2018  HbA1c - - 9.4 7.5(H) - 9.6(H)  Microalbumin Not Estab. ug/mL - - - - -  Micro/Creat Ratio 0 - 29 mg/g creat - - 42(H) - -  Chol 100 - 199 mg/dL 124 - 157 - -  HDL >39 mg/dL 66 - 58 - -  Calc LDL 0 - 99 mg/dL 41 - 78 - -  Triglycerides 0 - 149 mg/dL 88 - 118 - -  Creatinine 0.57 - 1.00 mg/dL 0.83 - 1.11(H) 0.85 0.83   BP/Weight 07/01/2020 05/08/2020 05/07/2020 01/04/2020 09/15/2019 08/24/2019 2/95/2841  Systolic BP 324 401 027 253 664 403 474  Diastolic BP 86 80 82 90 83 82 84  Wt. (Lbs) 226.04 231.6 233 238 238 243.8 239.4  BMI 38.2 38.54 38.77 39.61 39.61 40.57 39.84  Some encounter information is confidential and restricted. Go to Review Flowsheets activity to see all data.   Foot/eye exam completion dates Latest Ref Rng & Units 05/07/2020 02/14/2020  Eye Exam No Retinopathy - No Retinopathy  Foot Form Completion - Done -

## 2020-07-15 ENCOUNTER — Ambulatory Visit (INDEPENDENT_AMBULATORY_CARE_PROVIDER_SITE_OTHER): Payer: BC Managed Care – PPO | Admitting: Psychology

## 2020-07-15 DIAGNOSIS — F319 Bipolar disorder, unspecified: Secondary | ICD-10-CM

## 2020-07-30 ENCOUNTER — Ambulatory Visit: Payer: BC Managed Care – PPO | Admitting: Nurse Practitioner

## 2020-07-30 ENCOUNTER — Encounter: Payer: Self-pay | Admitting: Nurse Practitioner

## 2020-07-30 ENCOUNTER — Other Ambulatory Visit: Payer: Self-pay

## 2020-07-30 DIAGNOSIS — J0110 Acute frontal sinusitis, unspecified: Secondary | ICD-10-CM

## 2020-07-30 MED ORDER — DOXYCYCLINE HYCLATE 100 MG PO TABS: 100.0000 mg | ORAL_TABLET | Freq: Two times a day (BID) | ORAL | 0 refills | Status: DC

## 2020-07-30 MED ORDER — BENZONATATE 100 MG PO CAPS: 100.0000 mg | ORAL_CAPSULE | Freq: Two times a day (BID) | ORAL | 0 refills | Status: DC | PRN

## 2020-07-30 MED ORDER — NOREL AD 4-10-325 MG PO TABS: 1.0000 | ORAL_TABLET | ORAL | 1 refills | Status: DC | PRN

## 2020-07-30 NOTE — Assessment & Plan Note (Signed)
-  her children have been sick recently with similar symptoms, and they tested negative for flu, COVID, and strep -Rx. Doxycycline (has augmentin allergy) -Rx. norel and tessalon for symptoms

## 2020-07-30 NOTE — Progress Notes (Signed)
Acute Office Visit  Subjective:    Patient ID: Christina [name], female    DOB: Feb 25, 1976, 44 y.o.   MRN: [id number]  Chief Complaint  Patient presents with  . Sinusitis    X 6 days   . Cough    HPI Patient is in today for cough and nasal congestion that started about a week ago.  Her children had similar symptoms, and they all tested negative for COVID, strep, and flu. She has had fever and sinus pain. She has sore throat and runny nose as well. Denies SOB.  Past Medical History:  Diagnosis Date  . Anemia   . Bipolar disorder ([place])   . Depression   . Diabetes mellitus    type 2 DM, insulin only needed during pregnancy  . Gastroparesis   . 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 GERD (gastroesophageal reflux disease)   . H/O acute sinusitis 10/2016  . Hypertension   . Thyroid enlargement   . Wears glasses     Past Surgical History:  Procedure Laterality Date  . BREAST REDUCTION SURGERY  1994  . CESAREAN SECTION N/A 03/06/2016   Procedure: CESAREAN SECTION;  Surgeon: Ena Dawley, MD;  Location: [place];  Service: Obstetrics;  Laterality: N/A;  . COLONOSCOPY WITH PROPOFOL N/A 10/26/2017   Dr. Oneida Alar: Torturous transverse and sigmoid colon, internal hemorrhoids.  Next colonoscopy in 10 years with MAC and color wrap  . dermoid tumor  2000  . DILITATION & CURRETTAGE/HYSTROSCOPY WITH NOVASURE ABLATION N/A 12/10/2016   Procedure: DILATATION & CURETTAGE/HYSTEROSCOPY WITH NOVASURE ABLATION;  Surgeon: Ena Dawley, MD;  Location: [facility];  Service: Gynecology;  Laterality: N/A;  . ESOPHAGOGASTRODUODENOSCOPY  07/01/2009   MPN:TIRWER esphagus without barrett's/dilation with 16 mm/mild erthyema in the antrum. mild chronic gastritis on path.   . ESOPHAGOGASTRODUODENOSCOPY (EGD) WITH PROPOFOL N/A 10/26/2017   Dr. Oneida Alar: Gastritis, gastric polyps.  Biopsies benign.  Small bowel biopsies negative for celiac.  No H. pylori.  Marland Kitchen HERNIA REPAIR  12/2018  .  REDUCTION MAMMAPLASTY     1994  . TRIGGER FINGER RELEASE Bilateral 06/2015  . TUBAL LIGATION Bilateral 03/06/2016   Procedure: BILATERAL TUBAL LIGATION;  Surgeon: Ena Dawley, MD;  Location: [place];  Service: Obstetrics;  Laterality: Bilateral;    Family History  Problem Relation Age of Onset  . Diabetes Mother   . Hypertension Mother   . Fibromyalgia Mother   . Stroke Mother   . Diabetes Father   . Hypertension Father   . Asthma Father   . Heart disease Father   . Depression Father   . Colon cancer Neg Hx     Social History   Socioeconomic History  . Marital status: Married    Spouse name: Not on file  . Number of children: Not on file  . Years of education: Not on file  . Highest education level: Not on file  Occupational History  . Not on file  Tobacco Use  . Smoking status: Never Smoker  . Smokeless tobacco: Never Used  . Tobacco comment: not everyday  Vaping Use  . Vaping Use: Never used  Substance and Sexual Activity  . Alcohol use: Yes    Alcohol/week: 0.0 standard drinks    Comment: occ  . Drug use: No  . Sexual activity: Yes    Partners: Male    Birth control/protection: Surgical  Other Topics Concern  . Not on file  Social History Narrative  .  Not on file   Social Determinants of Health   Financial Resource Strain: Not on file  Food Insecurity: Not on file  Transportation Needs: Not on file  Physical Activity: Not on file  Stress: Not on file  Social Connections: Not on file  Intimate Partner Violence: Not on file    Outpatient Medications Prior to Visit  Medication Sig Dispense Refill  . acetaminophen (TYLENOL) 500 MG tablet Take 1,000 mg every 8 (eight) hours as needed by mouth for mild pain.     . ARIPiprazole (ABILIFY) 5 MG tablet Take 1 tablet (5 mg total) by mouth every evening. 90 tablet 0  . azithromycin (ZITHROMAX) 250 MG tablet Take two tablets on day one, then take one tablet once daily for an additional four days 6  tablet 0  . B-D UF III MINI PEN NEEDLES 31G X 5 MM MISC USE AS DIRECTED 100 each 0  . Coenzyme Q10 (CO Q-10) 300 MG CAPS Take by mouth. Once daily    . Cranberry 425 MG CAPS Take 2 capsules by mouth in the morning and at bedtime.    . Flaxseed, Linseed, (FLAX SEED OIL) 1000 MG CAPS Take 1 capsule by mouth daily.    . fluticasone (FLONASE) 50 MCG/ACT nasal spray Place 2 sprays into both nostrils daily. 48 mL 5  . glucose blood test strip Use as instructed, three times daily 300 each 12  . Insulin Glargine (LANTUS) 100 UNIT/ML Solostar Pen Inject 50 Units into the skin at bedtime. 15 mL 11  . levocetirizine (XYZAL) 5 MG tablet Take 5 mg by mouth every evening.    Marland Kitchen LORazepam (ATIVAN) 0.5 MG tablet Take 1/2 to one tab as needed for severe anxiety 5 tablet 0  . losartan (COZAAR) 50 MG tablet Take 1 tablet (50 mg total) by mouth daily. 90 tablet 1  . Magnesium 200 MG TABS Take 1 tablet by mouth daily.    . metFORMIN (GLUCOPHAGE) 1000 MG tablet TAKE 1 TABLET BY MOUTH TWICE DAILY WITH MEALS 180 tablet 0  . montelukast (SINGULAIR) 10 MG tablet Take 1 tablet (10 mg total) by mouth at bedtime. 90 tablet 3  . omeprazole (PRILOSEC) 40 MG capsule Take 1 capsule (40 mg total) by mouth 2 (two) times daily before a meal. 60 capsule 5  . ONETOUCH DELICA LANCETS 08M MISC Three times daily testing dx e11.9 300 each 5  . OZEMPIC, 1 MG/DOSE, 2 MG/1.5ML SOPN Inject 1 mg into the skin once a week.    . rosuvastatin (CRESTOR) 5 MG tablet Take one tablet by mouth three times weekly 36 tablet 3  . sodium chloride (OCEAN) 0.65 % SOLN nasal spray Place 1 spray into both nostrils 4 (four) times daily as needed for congestion.     No facility-administered medications prior to visit.    Allergies  Allergen Reactions  . Other Anaphylaxis    Tree nuts  . Peanut Oil Anaphylaxis  . Peanut-Containing Drug Products Anaphylaxis  . Augmentin [Amoxicillin-Pot Clavulanate] Nausea And Vomiting    Causes stomach cramps. Patient  can take amoxicillin, not Augmentin. Has patient had a PCN reaction causing immediate rash, facial/tongue/throat swelling, SOB or lightheadedness with hypotension: No Has patient had a PCN reaction causing severe rash involving mucus membranes or skin necrosis: No Has patient had a PCN reaction that required hospitalization: No Has patient had a PCN reaction occurring within the last 10 years: No If all of the above answers are "NO", then may proceed with [facility]  Review of Systems  Constitutional: Positive for fever. Negative for chills and fatigue.  HENT: Positive for ear pain, rhinorrhea, sinus pressure, sinus pain and sore throat.   Respiratory: Positive for cough. Negative for chest tightness, shortness of breath and wheezing.   Cardiovascular: Negative.        Objective:    Physical Exam HENT:     Nose: Congestion present.     Comments: Sinuses tender to palpation Cardiovascular:     Rate and Rhythm: Normal rate and regular rhythm.     Pulses: Normal pulses.     Heart sounds: Normal heart sounds.  Pulmonary:     Effort: Pulmonary effort is normal.     Breath sounds: Normal breath sounds.     BP 135/89   Pulse 98   Temp 98.3 F (36.8 C)   Resp 20   Ht _0  (1.651 m)   Wt 226 lb (102.5 kg)   SpO2 95%   BMI 37.61 kg/m  Wt Readings from Last 3 Encounters:  07/30/20 226 lb (102.5 kg)  07/01/20 226 lb 0.6 oz (102.5 kg)  05/08/20 231 lb 9.6 oz (105.1 kg)    Health Maintenance Due  Topic Date Due  . Hepatitis C Screening  Never done    There are no preventive care reminders to display for this patient.   Lab Results  Component Value Date   TSH 1.420 01/12/2020   Lab Results  Component Value Date   WBC 5.5 06/02/2019   HGB 12.4 06/02/2019   HCT 36.7 06/02/2019   MCV 87 06/02/2019   PLT 368 06/02/2019   Lab Results  Component Value Date   NA 144 01/12/2020   K 4.5 01/12/2020   CO2 23 01/12/2020   GLUCOSE 108 (H) 01/12/2020   BUN 8  01/12/2020   CREATININE 0.83 01/12/2020   BILITOT 0.4 01/12/2020   ALKPHOS 67 01/12/2020   AST 14 01/12/2020   ALT 9 01/12/2020   PROT 7.0 01/12/2020   ALBUMIN 4.4 01/12/2020   CALCIUM 9.6 01/12/2020   ANIONGAP 7 06/28/2017   Lab Results  Component Value Date   CHOL 124 01/12/2020   Lab Results  Component Value Date   HDL 66 01/12/2020   Lab Results  Component Value Date   LDLCALC 41 01/12/2020   Lab Results  Component Value Date   TRIG 88 01/12/2020   Lab Results  Component Value Date   CHOLHDL 2.8 10/04/2017   Lab Results  Component Value Date   HGBA1C 9.4 12/22/2019       Assessment & Plan:   Problem List Items Addressed This Visit      Respiratory   Frontal sinusitis    -her children have been sick recently with similar symptoms, and they tested negative for flu, COVID, and strep -Rx. Doxycycline (has augmentin allergy) -Rx. norel and tessalon for symptoms       Relevant Medications   doxycycline (VIBRA-TABS) 100 MG tablet   benzonatate (TESSALON) 100 MG capsule   Chlorphen-PE-Acetaminophen (NOREL AD) 4-10-325 MG TABS       Meds ordered this encounter  Medications  . doxycycline (VIBRA-TABS) 100 MG tablet    Sig: Take 1 tablet (100 mg total) by mouth 2 (two) times daily.    Dispense:  20 tablet    Refill:  0  . benzonatate (TESSALON) 100 MG capsule    Sig: Take 1 capsule (100 mg total) by mouth 2 (two) times daily as needed for cough.  Dispense:  20 capsule    Refill:  0  . Chlorphen-PE-Acetaminophen (NOREL AD) 4-10-325 MG TABS    Sig: Take 1 tablet by mouth every 4 (four) hours as needed (nasal congestion, cold symptoms).    Dispense:  20 tablet    Refill:  [place], NP

## 2020-07-31 ENCOUNTER — Ambulatory Visit: Payer: BC Managed Care – PPO | Admitting: Psychology

## 2020-08-01 ENCOUNTER — Other Ambulatory Visit: Payer: Self-pay

## 2020-08-01 ENCOUNTER — Encounter (HOSPITAL_COMMUNITY): Payer: Self-pay | Admitting: Psychiatry

## 2020-08-01 ENCOUNTER — Ambulatory Visit (INDEPENDENT_AMBULATORY_CARE_PROVIDER_SITE_OTHER): Payer: BC Managed Care – PPO | Admitting: Psychiatry

## 2020-08-01 DIAGNOSIS — F419 Anxiety disorder, unspecified: Secondary | ICD-10-CM

## 2020-08-01 DIAGNOSIS — F3162 Bipolar disorder, current episode mixed, moderate: Secondary | ICD-10-CM

## 2020-08-01 MED ORDER — ARIPIPRAZOLE 5 MG PO TABS: 5.0000 mg | ORAL_TABLET | Freq: Every evening | ORAL | 0 refills | Status: DC

## 2020-08-01 NOTE — Progress Notes (Signed)
Virtual Visit via Telephone Note  I connected with Christina [name] on 08/01/20 at 10:20 AM EST by telephone and verified that I am speaking with the correct person using two identifiers.  Location: Patient: Home Provider: Home Office   I discussed the limitations, risks, security and privacy concerns of performing an evaluation and management service by telephone and the availability of in person appointments. I also discussed with the patient that there may be a patient responsible charge related to this service. The patient expressed understanding and agreed to proceed.   History of Present Illness: Patient was evaluated by phone session.  She is on the phone by herself.  She admitted taking single pill of lorazepam when her grandmother died on 06/17/2023.  Patient told she was 44 years old and news was given to her and she was very upset however after taking lorazepam she felt better.  She has not taking any Ativan since then.  Recently she has flulike symptoms and she is taking medicine but now feeling better and back to work.  She denies any hallucination, paranoia or any suicidal thoughts.  Her job is going well and recently she was recommended to apply for higher position but she wants to finish her masters and does not get distracted.  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  Her school is going well.  She is trying to keep her balance in life.  She denies any irritability, anger, mania.  She is trying to lose weight and she lost few pounds since the last visit.  She is scheduled to have hemoglobin A1c soon.  She has no tremors, shakes or any EPS.  She feels her sugar is much better since she is watching her diet and checking her blood sugar every day.  Her 42-year-old son still needs more evaluation and now she is hoping to have a hearing test soon.  She is in therapy with Leeann.   Past Psychiatric History: H/Odepression, anxiety, mania and  psychosis. Admitted in 1997 at [facility] due to suicidal gestures. Seen inthis office since 2006. TriedProzac and Wellbutrin.   Psychiatric Specialty Exam: Physical Exam  Review of Systems  unknown if currently breastfeeding.There is no height or weight on file to calculate BMI.  General Appearance: NA  Eye Contact:  NA  Speech:  Clear and Coherent  Volume:  Normal  Mood:  Dysphoric  Affect:  NA  Thought Process:  Goal Directed  Orientation:  Full (Time, Place, and Person)  Thought Content:  Logical  Suicidal Thoughts:  No  Homicidal Thoughts:  No  Memory:  Immediate;   Good Recent;   Good Remote;   Good  Judgement:  Intact  Insight:  Present  Psychomotor Activity:  NA  Concentration:  Concentration: Good and Attention Span: Good  Recall:  Good  Fund of Knowledge:  Good  Language:  Good  Akathisia:  No  Handed:  Right  AIMS (if indicated):     Assets:  Communication Skills Desire for Improvement Housing Resilience Social Support  ADL's:  Intact  Cognition:  WNL  Sleep:         Assessment and Plan: Bipolar disorder type I.  Anxiety.  Patient is a stable on Abilify 5 mg daily.  She denies any mania, hallucination, irritability.  Her anxiety is also well managed and she took only once Ativan.  Discussed medication side effects and benefits.  Continue Abilify 5 mg daily.  Encourage therapy with Leeann.  She does not need a new prescription Ativan however she is told if needed then she can call us back.  Recommended to call us back if she has any question or worsening of symptoms.  Follow-up in 3 months.  Follow Up Instructions:    I discussed the assessment and treatment plan with the patient. The patient was provided an opportunity to ask questions and all were answered. The patient agreed with the plan and demonstrated an understanding of the instructions.   The patient was advised to call back or seek an in-person evaluation if the symptoms worsen or if the  condition fails to improve as anticipated.  I provided 12 minutes of non-face-to-face time during this encounter.   Kathlee Nations, MD

## 2020-08-09 DIAGNOSIS — Z794 Long term (current) use of insulin: Secondary | ICD-10-CM | POA: Diagnosis not present

## 2020-08-09 DIAGNOSIS — E1165 Type 2 diabetes mellitus with hyperglycemia: Secondary | ICD-10-CM | POA: Diagnosis not present

## 2020-08-26 ENCOUNTER — Other Ambulatory Visit: Payer: Self-pay

## 2020-08-26 ENCOUNTER — Telehealth: Payer: BC Managed Care – PPO | Admitting: Family Medicine

## 2020-08-26 ENCOUNTER — Encounter: Payer: Self-pay | Admitting: Family Medicine

## 2020-08-26 VITALS — BP 122/91 | HR 100 | Ht 64.5 in | Wt 221.0 lb

## 2020-08-26 DIAGNOSIS — F3162 Bipolar disorder, current episode mixed, moderate: Secondary | ICD-10-CM | POA: Diagnosis not present

## 2020-08-26 DIAGNOSIS — I10 Essential (primary) hypertension: Secondary | ICD-10-CM | POA: Diagnosis not present

## 2020-08-26 DIAGNOSIS — J329 Chronic sinusitis, unspecified: Secondary | ICD-10-CM | POA: Diagnosis not present

## 2020-08-26 DIAGNOSIS — E1159 Type 2 diabetes mellitus with other circulatory complications: Secondary | ICD-10-CM

## 2020-08-26 MED ORDER — FLUCONAZOLE 150 MG PO TABS: ORAL_TABLET | ORAL | 0 refills | Status: DC

## 2020-08-26 MED ORDER — SULFAMETHOXAZOLE-TRIMETHOPRIM 800-160 MG PO TABS: 1.0000 | ORAL_TABLET | Freq: Two times a day (BID) | ORAL | 0 refills | Status: DC

## 2020-08-26 NOTE — Patient Instructions (Addendum)
F/U as before , call if you need me sooner  Work excuse from 01/24 return 08/28/2020, we will fax to [phone] to your home ( nurse/ front desk staff,  please verify with the patient , the  fax number before faxing   Septra and fluconazole are prescribed for sinusitis  I strongly recommend starting the immunotherapy recommended for allergic sinusitis, since in the past 2 months you are reporting symptoms suggestive of sinus infection  Please get X ray sinuses, labs ordered in October, fasting,  and submit sinus drainage for culture  Congrats on improved blood sugar, keep it up!  It is important that you exercise regularly at least 30 minutes 5 times a week. If you develop chest pain, have severe difficulty breathing, or feel very tired, stop exercising immediately and seek medical attention  Think about what you will eat, plan ahead. Choose " clean, green, fresh or frozen" over canned, processed or packaged foods which are more sugary, salty and fatty. 70 to 75% of food eaten should be vegetables and fruit. Three meals at set times with snacks allowed between meals, but they must be fruit or vegetables. Aim to eat over a 12 hour period , example 7 am to 7 pm, and STOP after  your last meal of the day. Drink water,generally about 64 ounces per day, no other drink is as healthy. Fruit juice is best enjoyed in a healthy way, by EATING the fruit. Thanks for choosing [facility], we consider it a privelige to serve you.

## 2020-08-26 NOTE — Progress Notes (Signed)
Virtual Visit via Telephone Note  I connected with Christina [name] on 08/26/20 at  3:00 PM EST by telephone and verified that I am speaking with the correct person using two identifiers.  Location: Patient: home Provider: work   I discussed the limitations, risks, security and privacy concerns of performing an evaluation and management service by telephone and the availability of in person appointments. I also discussed with the patient that there may be a patient responsible charge related to this service. The patient expressed understanding and agreed to proceed.   History of Present Illness:   9 day h/o sinus pressure, drainage and cough, temp max 100, body aches, no chills,  Bilateral ear pain and sore throat Drainage currently light yellow , was clear, also bloody, yesterday was awful, clly, dry cough, drainage Forehead and right cheek have pressure rated at 6 to 8 Observations/Objective: BP (!) 122/91   Pulse 100   Ht 5' 4.5" (1.638 m)   Wt 221 lb (100.2 kg)   SpO2 97%   BMI 37.35 kg/m  Good communication with no confusion and intact memory. Alert and oriented x 3 No signs of respiratory distress during speech    Assessment and Plan:  Chronic sinusitis 3 episodes of acute pain and pressure with colored nasal drainage, pt is not getting the immunotherapy recommended as she is unable to find the time, I recommend same due to recurrent severe symptoms. X ray sinus today  Essential hypertension, benign Controlled, no change in medication DASH diet and commitment to daily physical activity for a minimum of 30 minutes discussed and encouraged, as a part of hypertension management. The importance of attaining a healthy weight is also discussed.  BP/Weight 08/26/2020 07/30/2020 07/01/2020 05/08/2020 05/07/2020 01/04/2020 2/67/1245  Systolic BP 809 983 382 505 397 673 419  Diastolic BP 91 89 86 80 82 90 83  Wt. (Lbs) 221 226 226.04 231.6 233 238 238  BMI 37.35 37.61 38.2 38.54 38.77  39.61 39.61  Some encounter information is confidential and restricted. Go to Review Flowsheets activity to see all data.       Bipolar 1 disorder, mixed, moderate (HCC) Controlled, no change in medication Managed by Psych  Morbid obesity [facility])  Patient re-educated about  the importance of commitment to a  minimum of 150 minutes of exercise per week as able.  The importance of healthy food choices with portion control discussed, as well as eating regularly and within a 12 hour window most days. The need to choose "clean , green" food 50 to 75% of the time is discussed, as well as to make water the primary drink and set a goal of 64 ounces water daily.    Weight /BMI 08/26/2020 07/30/2020 07/01/2020  WEIGHT 221 lb 226 lb 226 lb 0.6 oz  HEIGHT 5' 4.5" 5\' 5"  5' 4.5"  BMI 37.35 kg/m2 37.61 kg/m2 38.2 kg/m2  Some encounter information is confidential and restricted. Go to Review Flowsheets activity to see all data.      Type 2 diabetes mellitus with vascular disease ([place]) Improved control managed by Endo at [facility]   Follow Up Instructions:    I discussed the assessment and treatment plan with the patient. The patient was provided an opportunity to ask questions and all were answered. The patient agreed with the plan and demonstrated an understanding of the instructions.   The patient was advised to call back or seek an in-person evaluation if the symptoms worsen or if the condition fails to  improve as anticipated.  I provided 22 minutes of non-face-to-face time during this encounter.   Tula Nakayama, MD

## 2020-08-27 ENCOUNTER — Other Ambulatory Visit: Payer: Self-pay

## 2020-08-27 ENCOUNTER — Ambulatory Visit (HOSPITAL_COMMUNITY)
Admission: RE | Admit: 2020-08-27 | Discharge: 2020-08-27 | Disposition: A | Discharge status: Home / Self Care | Payer: BC Managed Care – PPO | Source: Ambulatory Visit | Attending: Family Medicine | Admitting: Family Medicine

## 2020-08-27 DIAGNOSIS — J329 Chronic sinusitis, unspecified: Secondary | ICD-10-CM | POA: Insufficient documentation

## 2020-08-28 ENCOUNTER — Encounter: Payer: Self-pay | Admitting: Family Medicine

## 2020-08-28 NOTE — Assessment & Plan Note (Signed)
  Patient re-educated about  the importance of commitment to a  minimum of 150 minutes of exercise per week as able.  The importance of healthy food choices with portion control discussed, as well as eating regularly and within a 12 hour window most days. The need to choose "clean , green" food 50 to 75% of the time is discussed, as well as to make water the primary drink and set a goal of 64 ounces water daily.    Weight /BMI 08/26/2020 07/30/2020 07/01/2020  WEIGHT 221 lb 226 lb 226 lb 0.6 oz  HEIGHT 5' 4.5" 5' 5" 5' 4.5"  BMI 37.35 kg/m2 37.61 kg/m2 38.2 kg/m2  Some encounter information is confidential and restricted. Go to Review Flowsheets activity to see all data.     

## 2020-08-28 NOTE — Assessment & Plan Note (Signed)
3 episodes of acute pain and pressure with colored nasal drainage, pt is not getting the immunotherapy recommended as she is unable to find the time, I recommend same due to recurrent severe symptoms. X ray sinus today

## 2020-08-28 NOTE — Assessment & Plan Note (Signed)
Controlled, no change in medication Managed by Psych 

## 2020-08-28 NOTE — Assessment & Plan Note (Signed)
Controlled, no change in medication DASH diet and commitment to daily physical activity for a minimum of 30 minutes discussed and encouraged, as a part of hypertension management. The importance of attaining a healthy weight is also discussed.  BP/Weight 08/26/2020 07/30/2020 07/01/2020 05/08/2020 05/07/2020 01/04/2020 8/84/1660  Systolic BP 630 160 109 323 557 322 025  Diastolic BP 91 89 86 80 82 90 83  Wt. (Lbs) 221 226 226.04 231.6 233 238 238  BMI 37.35 37.61 38.2 38.54 38.77 39.61 39.61  Some encounter information is confidential and restricted. Go to Review Flowsheets activity to see all data.

## 2020-08-28 NOTE — Assessment & Plan Note (Signed)
Improved control managed by Endo at [facility]

## 2020-08-29 ENCOUNTER — Other Ambulatory Visit (HOSPITAL_COMMUNITY): Payer: Self-pay | Admitting: Psychiatry

## 2020-08-29 DIAGNOSIS — F419 Anxiety disorder, unspecified: Secondary | ICD-10-CM

## 2020-08-29 DIAGNOSIS — F3162 Bipolar disorder, current episode mixed, moderate: Secondary | ICD-10-CM

## 2020-09-18 DIAGNOSIS — E1159 Type 2 diabetes mellitus with other circulatory complications: Secondary | ICD-10-CM | POA: Diagnosis not present

## 2020-09-18 DIAGNOSIS — Z1159 Encounter for screening for other viral diseases: Secondary | ICD-10-CM | POA: Diagnosis not present

## 2020-09-18 DIAGNOSIS — E559 Vitamin D deficiency, unspecified: Secondary | ICD-10-CM | POA: Diagnosis not present

## 2020-09-18 DIAGNOSIS — I10 Essential (primary) hypertension: Secondary | ICD-10-CM | POA: Diagnosis not present

## 2020-09-19 LAB — MAGNESIUM: Magnesium: 1.6 mg/dL (ref 1.6–2.3)

## 2020-09-19 LAB — LIPID PANEL
Chol/HDL Ratio: 1.9 ratio (ref 0.0–4.4)
Cholesterol, Total: 122 mg/dL (ref 100–199)
HDL: 63 mg/dL (ref >39)
LDL Chol Calc (NIH): 43 mg/dL (ref 0–99)
Triglycerides: 85 mg/dL (ref 0–149)
VLDL Cholesterol Cal: 16 mg/dL (ref 5–40)

## 2020-09-19 LAB — CMP14+EGFR
ALT: 11 IU/L (ref 0–32)
AST: 9 IU/L (ref 0–40)
Albumin/Globulin Ratio: 1.6 (ref 1.2–2.2)
Albumin: 4.4 g/dL (ref 3.8–4.8)
Alkaline Phosphatase: 66 IU/L (ref 44–121)
BUN/Creatinine Ratio: 10 (ref 9–23)
BUN: 8 mg/dL (ref 6–24)
Bilirubin Total: 0.4 mg/dL (ref 0.0–1.2)
CO2: 23 mmol/L (ref 20–29)
Calcium: 9.7 mg/dL (ref 8.7–10.2)
Chloride: 102 mmol/L (ref 96–106)
Creatinine, Ser: 0.82 mg/dL (ref 0.57–1.00)
GFR calc Af Amer: 101 mL/min/{1.73_m2} (ref >59)
GFR calc non Af Amer: 87 mL/min/{1.73_m2} (ref >59)
Globulin, Total: 2.7 g/dL (ref 1.5–4.5)
Glucose: 89 mg/dL (ref 65–99)
Potassium: 4.2 mmol/L (ref 3.5–5.2)
Sodium: 141 mmol/L (ref 134–144)
Total Protein: 7.1 g/dL (ref 6.0–8.5)

## 2020-09-19 LAB — HEPATITIS C ANTIBODY: Hep C Virus Ab: <0.1 s/co ratio (ref 0.0–0.9)

## 2020-09-19 LAB — VITAMIN D 25 HYDROXY (VIT D DEFICIENCY, FRACTURES): Vit D, 25-Hydroxy: 25.7 ng/mL — ABNORMAL LOW (ref 30.0–100.0)

## 2020-09-24 ENCOUNTER — Ambulatory Visit: Payer: BC Managed Care – PPO | Admitting: Family Medicine

## 2020-09-26 ENCOUNTER — Telehealth (INDEPENDENT_AMBULATORY_CARE_PROVIDER_SITE_OTHER): Payer: BC Managed Care – PPO | Admitting: Family Medicine

## 2020-09-26 ENCOUNTER — Other Ambulatory Visit: Payer: Self-pay

## 2020-09-26 DIAGNOSIS — F319 Bipolar disorder, unspecified: Secondary | ICD-10-CM | POA: Diagnosis not present

## 2020-09-26 DIAGNOSIS — Z1231 Encounter for screening mammogram for malignant neoplasm of breast: Secondary | ICD-10-CM

## 2020-09-26 DIAGNOSIS — Z13220 Encounter for screening for lipoid disorders: Secondary | ICD-10-CM

## 2020-09-26 DIAGNOSIS — E1159 Type 2 diabetes mellitus with other circulatory complications: Secondary | ICD-10-CM

## 2020-09-26 DIAGNOSIS — I10 Essential (primary) hypertension: Secondary | ICD-10-CM | POA: Diagnosis not present

## 2020-09-26 DIAGNOSIS — E559 Vitamin D deficiency, unspecified: Secondary | ICD-10-CM

## 2020-09-26 DIAGNOSIS — D508 Other iron deficiency anemias: Secondary | ICD-10-CM

## 2020-09-26 MED ORDER — LOSARTAN POTASSIUM 50 MG PO TABS: 50.0000 mg | ORAL_TABLET | Freq: Every day | ORAL | 1 refills | Status: DC

## 2020-09-26 MED ORDER — AZELASTINE HCL 0.1 % NA SOLN: 2.0000 | Freq: Two times a day (BID) | NASAL | 12 refills | Status: DC

## 2020-09-26 NOTE — Patient Instructions (Addendum)
F/U in 6 months, call if you need me sooner  Fasting lipid, cmp and EGFr, Vit D and magnesium 5 days before follow up , [facility]  Mammogram due July 24 or after to be scheduled at checkout, pt requests mobile unit if possible  Please send current weight and blood pressure  CONGRATS on excellent labs  Please send your next HBA1C when you get this  New is OTC vit D 3 2000IU  Once daily  New is magnesium one daily  New is Astelin nasal spray use daily along with your 3 other allergy medications until you start immuotherapy  It is important that you exercise regularly at least 30 minutes 5 times a week. If you develop chest pain, have severe difficulty breathing, or feel very tired, stop exercising immediately and seek medical attention   Carb counting and portion control are ey to success with diabetes and weight loss  Thanks for choosing [facility], we consider it a privelige to serve you.

## 2020-09-26 NOTE — Progress Notes (Signed)
Virtual Visit via Video Note  I connected with Christina [name] on 09/26/20 at  8:40 AM EST by a video enabled telemedicine application and verified that I am speaking with the correct person using two identifiers.  Location: Patient: work Provider: office   I discussed the limitations of evaluation and management by telemedicine and the availability of in person appointments. The patient expressed understanding and agreed to proceed.  History of Present Illness: F/U chronic problems and address any new or current concerns. Review and update medications and allergies. Review recent lab and radiologic data . Update routine health maintainace. Review an encourage improved health habits to include nutrition, exercise and  sleep .  Denies recent fever or chills. Denies sinus pressure, nasal congestion, ear pain or sore throat. Denies chest congestion, productive cough or wheezing. Denies chest pains, palpitations and leg swelling Denies abdominal pain, nausea, vomiting,diarrhea or constipation.   Denies dysuria, frequency, hesitancy or incontinence. Denies joint pain, swelling and limitation in mobility. Denies headaches, seizures, numbness, or tingling. Denies uncontrolled  depression, anxiety or insomnia. Denies skin break down or rash. Denies polyuria, polydipsia, blurred vision , or hypoglycemic episodes. Needs to exercise more frequently      Observations/Objective: There were no vitals taken for this visit. Good communication with no confusion and intact memory. Alert and oriented x 3 No signs of respiratory distress during speech    Assessment and Plan:  Essential hypertension, benign DASH diet and commitment to daily physical activity for a minimum of 30 minutes discussed and encouraged, as a part of hypertension management. The importance of attaining a healthy weight is also discussed.  BP/Weight 08/26/2020 07/30/2020 07/01/2020 05/08/2020 05/07/2020 01/04/2020 0/03/6760   Systolic BP 950 932 671 245 809 983 382  Diastolic BP 91 89 86 80 82 90 83  Wt. (Lbs) 221 226 226.04 231.6 233 238 238  BMI 37.35 37.61 38.2 38.54 38.77 39.61 39.61  Some encounter information is confidential and restricted. Go to Review Flowsheets activity to see all data.  diastolic elevated, needs to work on weight loss and less sodium     Type 2 diabetes mellitus with vascular disease (HCC) Controlled, no change in medication [name] is reminded of the importance of commitment to daily physical activity for 30 minutes or more, as able and the need to limit carbohydrate intake to 30 to 60 grams per meal to help with blood sugar control.   The need to take medication as prescribed, test blood sugar as directed, and to call between visits if there is a concern that blood sugar is uncontrolled is also discussed.   Christina [name] is reminded of the importance of daily foot exam, annual eye examination, and good blood sugar, blood pressure and cholesterol control. Managed by Endo at [facility]  Diabetic Labs Latest Ref Rng & Units 09/18/2020 01/12/2020 12/22/2019 06/02/2019 02/01/2019  HbA1c - - - 9.4 7.5(H) -  Microalbumin Not Estab. ug/mL - - - - -  Micro/Creat Ratio 0 - 29 mg/g creat - - - 42(H) -  Chol 100 - 199 mg/dL 122 124 - 157 -  HDL >39 mg/dL 63 66 - 58 -  Calc LDL 0 - 99 mg/dL 43 41 - 78 -  Triglycerides 0 - 149 mg/dL 85 88 - 118 -  Creatinine 0.57 - 1.00 mg/dL 0.82 0.83 - 1.11(H) 0.85   BP/Weight 08/26/2020 07/30/2020 07/01/2020 05/08/2020 05/07/2020 01/04/2020 12/06/3974  Systolic BP 734 193 790 240 973 532 992  Diastolic BP 91 89  86 80 82 90 83  Wt. (Lbs) 221 226 226.04 231.6 233 238 238  BMI 37.35 37.61 38.2 38.54 38.77 39.61 39.61  Some encounter information is confidential and restricted. Go to Review Flowsheets activity to see all data.   Foot/eye exam completion dates Latest Ref Rng & Units 05/07/2020 02/14/2020  Eye Exam No Retinopathy - No Retinopathy  Foot Form Completion - Done  -        Iron deficiency anemia Updated lab needed at next visit  Bipolar 1 disorder Managed by Psych, controlled and stable  Morbid obesity ([place])  Patient re-educated about  the importance of commitment to a  minimum of 150 minutes of exercise per week as able.  The importance of healthy food choices with portion control discussed, as well as eating regularly and within a 12 hour window most days. The need to choose "clean , green" food 50 to 75% of the time is discussed, as well as to make water the primary drink and set a goal of 64 ounces water daily.    Weight /BMI 08/26/2020 07/30/2020 07/01/2020  WEIGHT 221 lb 226 lb 226 lb 0.6 oz  HEIGHT 5' 4.5" 5\' 5"  5' 4.5"  BMI 37.35 kg/m2 37.61 kg/m2 38.2 kg/m2  Some encounter information is confidential and restricted. Go to Review Flowsheets activity to see all data.       Follow Up Instructions:    I discussed the assessment and treatment plan with the patient. The patient was provided an opportunity to ask questions and all were answered. The patient agreed with the plan and demonstrated an understanding of the instructions.   The patient was advised to call back or seek an in-person evaluation if the symptoms worsen or if the condition fails to improve as anticipated.  I provided 20 minutes of non-face-to-face time during this encounter.   Tula Nakayama, MD

## 2020-09-27 ENCOUNTER — Encounter: Payer: Self-pay | Admitting: Family Medicine

## 2020-09-27 NOTE — Assessment & Plan Note (Signed)
  Patient re-educated about  the importance of commitment to a  minimum of 150 minutes of exercise per week as able.  The importance of healthy food choices with portion control discussed, as well as eating regularly and within a 12 hour window most days. The need to choose "clean , green" food 50 to 75% of the time is discussed, as well as to make water the primary drink and set a goal of 64 ounces water daily.    Weight /BMI 08/26/2020 07/30/2020 07/01/2020  WEIGHT 221 lb 226 lb 226 lb 0.6 oz  HEIGHT 5' 4.5" 5\' 5"  5' 4.5"  BMI 37.35 kg/m2 37.61 kg/m2 38.2 kg/m2  Some encounter information is confidential and restricted. Go to Review Flowsheets activity to see all data.

## 2020-09-27 NOTE — Assessment & Plan Note (Signed)
DASH diet and commitment to daily physical activity for a minimum of 30 minutes discussed and encouraged, as a part of hypertension management. The importance of attaining a healthy weight is also discussed.  BP/Weight 08/26/2020 07/30/2020 07/01/2020 05/08/2020 05/07/2020 01/04/2020 3/72/9021  Systolic BP 115 520 802 233 612 244 975  Diastolic BP 91 89 86 80 82 90 83  Wt. (Lbs) 221 226 226.04 231.6 233 238 238  BMI 37.35 37.61 38.2 38.54 38.77 39.61 39.61  Some encounter information is confidential and restricted. Go to Review Flowsheets activity to see all data.  diastolic elevated, needs to work on weight loss and less sodium

## 2020-09-27 NOTE — Assessment & Plan Note (Signed)
Managed by Psych, controlled and stable

## 2020-09-27 NOTE — Assessment & Plan Note (Signed)
Updated lab needed at next visit.  

## 2020-09-27 NOTE — Assessment & Plan Note (Signed)
Controlled, no change in medication Christina [name] is reminded of the importance of commitment to daily physical activity for 30 minutes or more, as able and the need to limit carbohydrate intake to 30 to 60 grams per meal to help with blood sugar control.   The need to take medication as prescribed, test blood sugar as directed, and to call between visits if there is a concern that blood sugar is uncontrolled is also discussed.   Christina [name] is reminded of the importance of daily foot exam, annual eye examination, and good blood sugar, blood pressure and cholesterol control. Managed by Endo at [facility]  Diabetic Labs Latest Ref Rng & Units 09/18/2020 01/12/2020 12/22/2019 06/02/2019 02/01/2019  HbA1c - - - 9.4 7.5(H) -  Microalbumin Not Estab. ug/mL - - - - -  Micro/Creat Ratio 0 - 29 mg/g creat - - - 42(H) -  Chol 100 - 199 mg/dL 122 124 - 157 -  HDL >39 mg/dL 63 66 - 58 -  Calc LDL 0 - 99 mg/dL 43 41 - 78 -  Triglycerides 0 - 149 mg/dL 85 88 - 118 -  Creatinine 0.57 - 1.00 mg/dL 0.82 0.83 - 1.11(H) 0.85   BP/Weight 08/26/2020 07/30/2020 07/01/2020 05/08/2020 05/07/2020 01/04/2020 09/10/221  Systolic BP 361 224 497 530 051 102 111  Diastolic BP 91 89 86 80 82 90 83  Wt. (Lbs) 221 226 226.04 231.6 233 238 238  BMI 37.35 37.61 38.2 38.54 38.77 39.61 39.61  Some encounter information is confidential and restricted. Go to Review Flowsheets activity to see all data.   Foot/eye exam completion dates Latest Ref Rng & Units 05/07/2020 02/14/2020  Eye Exam No Retinopathy - No Retinopathy  Foot Form Completion - Done -

## 2020-09-30 ENCOUNTER — Other Ambulatory Visit: Payer: Self-pay

## 2020-09-30 DIAGNOSIS — I10 Essential (primary) hypertension: Secondary | ICD-10-CM

## 2020-09-30 DIAGNOSIS — E559 Vitamin D deficiency, unspecified: Secondary | ICD-10-CM

## 2020-09-30 DIAGNOSIS — D508 Other iron deficiency anemias: Secondary | ICD-10-CM

## 2020-09-30 DIAGNOSIS — E1159 Type 2 diabetes mellitus with other circulatory complications: Secondary | ICD-10-CM

## 2020-10-01 ENCOUNTER — Encounter: Payer: Self-pay | Admitting: Internal Medicine

## 2020-10-01 ENCOUNTER — Ambulatory Visit (INDEPENDENT_AMBULATORY_CARE_PROVIDER_SITE_OTHER): Payer: BC Managed Care – PPO | Admitting: Psychology

## 2020-10-01 DIAGNOSIS — F319 Bipolar disorder, unspecified: Secondary | ICD-10-CM | POA: Diagnosis not present

## 2020-10-07 ENCOUNTER — Encounter: Payer: Self-pay | Admitting: Family Medicine

## 2020-10-08 ENCOUNTER — Ambulatory Visit: Payer: BC Managed Care – PPO | Admitting: Internal Medicine

## 2020-10-08 NOTE — Telephone Encounter (Signed)
Pt made an appt with dr patel 10-08-20 at 1:40

## 2020-10-16 ENCOUNTER — Ambulatory Visit: Payer: BC Managed Care – PPO | Admitting: Psychology

## 2020-10-22 DIAGNOSIS — E785 Hyperlipidemia, unspecified: Secondary | ICD-10-CM | POA: Diagnosis not present

## 2020-10-22 DIAGNOSIS — Z6838 Body mass index (BMI) 38.0-38.9, adult: Secondary | ICD-10-CM | POA: Diagnosis not present

## 2020-10-22 DIAGNOSIS — I10 Essential (primary) hypertension: Secondary | ICD-10-CM | POA: Diagnosis not present

## 2020-10-22 DIAGNOSIS — E1165 Type 2 diabetes mellitus with hyperglycemia: Secondary | ICD-10-CM | POA: Diagnosis not present

## 2020-10-22 DIAGNOSIS — D3501 Benign neoplasm of right adrenal gland: Secondary | ICD-10-CM | POA: Diagnosis not present

## 2020-10-22 DIAGNOSIS — Z794 Long term (current) use of insulin: Secondary | ICD-10-CM | POA: Diagnosis not present

## 2020-10-23 ENCOUNTER — Telehealth (INDEPENDENT_AMBULATORY_CARE_PROVIDER_SITE_OTHER): Payer: BC Managed Care – PPO | Admitting: Psychiatry

## 2020-10-23 ENCOUNTER — Encounter (HOSPITAL_COMMUNITY): Payer: Self-pay | Admitting: Psychiatry

## 2020-10-23 ENCOUNTER — Other Ambulatory Visit: Payer: Self-pay

## 2020-10-23 DIAGNOSIS — F419 Anxiety disorder, unspecified: Secondary | ICD-10-CM

## 2020-10-23 DIAGNOSIS — F3162 Bipolar disorder, current episode mixed, moderate: Secondary | ICD-10-CM | POA: Diagnosis not present

## 2020-10-23 MED ORDER — ARIPIPRAZOLE 5 MG PO TABS
5.0000 mg | ORAL_TABLET | Freq: Every evening | ORAL | 0 refills | Status: DC
Start: 2020-10-23 — End: 2021-02-26

## 2020-10-23 NOTE — Progress Notes (Signed)
Virtual Visit via Telephone Note  I connected with Christina [name] on 10/23/20 at  9:20 AM EDT by telephone and verified that I am speaking with the correct person using two identifiers.  Location: Patient: Work Provider: Biomedical scientist   I discussed the limitations, risks, security and privacy concerns of performing an evaluation and management service by telephone and the availability of in person appointments. I also discussed with the patient that there may be a patient responsible charge related to this service. The patient expressed understanding and agreed to proceed.   History of Present Illness: Patient is evaluated by phone session.  She is doing well on current medication.  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  She reported her mood is stable and she denies any irritability, highs or lows, mania or any psychosis.  Sometimes she gets frustrated because she is trying to get testing for her 45-year-old son to rule out autism but it is taking longer.  She is sleeping good.  She is in therapy with Leeann and things are going well.  She denies any major panic attack.  She has not taken Ativan in more than 6 months.  Recently she had blood work and her last hemoglobin A1c is 7.7 which was done in January which is actually dropped from 8.4.  She is going to have another hemoglobin A1c next month and she is hoping to drop further her numbers.  Patient does not want to change the medication.  She has no tremors shakes or any EPS.  Her family life is good.   gradual gradual company and she is happy about it.  Past Psychiatric History: H/Odepression, anxiety, mania and psychosis. Admitted in 1997 at [facility] due to suicidal gestures. Seen inthis office since 2006. TriedProzac and Wellbutrin.   Psychiatric Specialty Exam: Physical Exam  Review of Systems  Weight 221 lb (100.2 kg), unknown if currently breastfeeding.There is no height or weight on file to calculate BMI.   General Appearance: NA  Eye Contact:  NA  Speech:  Normal Rate  Volume:  Normal  Mood:  Euthymic  Affect:  NA  Thought Process:  Goal Directed  Orientation:  Full (Time, Place, and Person)  Thought Content:  WDL  Suicidal Thoughts:  No  Homicidal Thoughts:  No  Memory:  Immediate;   Good Recent;   Good Remote;   Good  Judgement:  Intact  Insight:  Present  Psychomotor Activity:  NA  Concentration:  Concentration: Good and Attention Span: Good  Recall:  Good  Fund of Knowledge:  Good  Language:  Good  Akathisia:  No  Handed:  Right  AIMS (if indicated):     Assets:  Communication Skills Desire for Improvement Housing Resilience Social Support Talents/Skills Transportation  ADL's:  Intact  Cognition:  WNL  Sleep:   good      Assessment and Plan: Bipolar disorder type I.  Anxiety.  I reviewed blood work results.  Her hemoglobin A1c is improved from the past.  Continue Abilify 5 mg daily which is helping her mania, hallucination and mood swings.  Encourage continued therapy with Leeann.  Recommended to call us back if she has any question or any concern.  Follow-up in 3 months.  Follow Up Instructions:    I discussed the assessment and treatment plan with the patient. The patient was provided an opportunity to ask questions and all were answered. The patient agreed with the plan and demonstrated an understanding of the  instructions.   The patient was advised to call back or seek an in-person evaluation if the symptoms worsen or if the condition fails to improve as anticipated.  I provided 19 minutes of non-face-to-face time during this encounter.   Kathlee Nations, MD

## 2020-10-30 ENCOUNTER — Ambulatory Visit: Payer: BC Managed Care – PPO | Admitting: Psychology

## 2020-11-07 ENCOUNTER — Other Ambulatory Visit: Payer: Self-pay | Admitting: Internal Medicine

## 2020-11-14 ENCOUNTER — Ambulatory Visit (INDEPENDENT_AMBULATORY_CARE_PROVIDER_SITE_OTHER): Payer: BC Managed Care – PPO | Admitting: Psychology

## 2020-11-14 DIAGNOSIS — F319 Bipolar disorder, unspecified: Secondary | ICD-10-CM

## 2020-11-26 ENCOUNTER — Ambulatory Visit (INDEPENDENT_AMBULATORY_CARE_PROVIDER_SITE_OTHER): Payer: BC Managed Care – PPO | Admitting: Psychology

## 2020-11-26 DIAGNOSIS — F319 Bipolar disorder, unspecified: Secondary | ICD-10-CM | POA: Diagnosis not present

## 2020-11-28 DIAGNOSIS — H524 Presbyopia: Secondary | ICD-10-CM | POA: Diagnosis not present

## 2020-12-02 ENCOUNTER — Ambulatory Visit: Payer: BC Managed Care – PPO | Admitting: Psychology

## 2020-12-05 ENCOUNTER — Encounter: Payer: Self-pay | Admitting: Internal Medicine

## 2020-12-05 ENCOUNTER — Ambulatory Visit: Payer: BC Managed Care – PPO | Admitting: Internal Medicine

## 2020-12-05 ENCOUNTER — Other Ambulatory Visit: Payer: Self-pay

## 2020-12-05 VITALS — BP 122/82 | HR 96 | Temp 96.9°F | Ht 65.0 in | Wt 227.0 lb

## 2020-12-05 DIAGNOSIS — K3184 Gastroparesis: Secondary | ICD-10-CM | POA: Diagnosis not present

## 2020-12-05 DIAGNOSIS — K219 Gastro-esophageal reflux disease without esophagitis: Secondary | ICD-10-CM | POA: Diagnosis not present

## 2020-12-05 DIAGNOSIS — R197 Diarrhea, unspecified: Secondary | ICD-10-CM | POA: Diagnosis not present

## 2020-12-05 NOTE — Patient Instructions (Signed)
I am happy to hear you are doing well.  For your chronic reflux I will trial omeprazole 40 mg once daily 30 minutes before breakfast.  If this is not adequate then he can go back to twice daily.    We will plan on colonoscopy in March 2029 for colon cancer screening purposes.  Follow-up with GI in 1 year or sooner if needed.  At [facility] we value your feedback. You may receive a survey about your visit today. Please share your experience as we strive to create trusting relationships with our patients to provide genuine, compassionate, quality care.  We appreciate your understanding and patience as we review any laboratory studies, imaging, and other diagnostic tests that are ordered as we care for you. Our office policy is 5 business days for review of these results, and any emergent or urgent results are addressed in a timely manner for your best interest. If you do not hear from our office in 1 week, please contact us.   We also encourage the use of MyChart, which contains your medical information for your review as well. If you are not enrolled in this feature, an access code is on this after visit summary for your convenience. Thank you for allowing [place] to be involved in your care.  It was great to see you today!  I hope you have a great rest of your spring!!    Elon Alas. Abbey Chatters, D.O. Gastroenterology and Hepatology [facility]

## 2020-12-05 NOTE — Progress Notes (Signed)
Referring Provider: [name], MD Primary Care Physician:  [name], MD Primary GI:  Dr. Abbey Chatters  Chief Complaint  Patient presents with  . gastroparesis    Doing ok    HPI:   Christina [name] is a 45 y.o. female who presents to clinic today for follow-up visit.  She has a history of chronic reflux.  She was previously on omeprazole 20 mg daily and had numerous breakthrough symptoms.  I increased her to 40 mg twice daily and she states her reflux is much better.  No dysphagia odynophagia.  No melena hematochezia.  No abdominal pain.  Does have history of gastroparesis but states this is doing well.  EGD in 2019 relatively unremarkable besides gastritis.  Colonoscopy at the same time without polyps.  On her last visit 6 months ago she was having diarrhea but recently started Ozempic.  She states this improved as her body got used to the medication.  However she just had an increase in dosage and is now having diarrhea again.  Past Medical History:  Diagnosis Date  . Anemia   . Bipolar disorder ([place])   . Depression   . Diabetes mellitus    type 2 DM, insulin only needed during pregnancy  . Diabetes mellitus without complication ([place])    Phreesia 08/26/2020  . Gastroparesis   . 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 GERD (gastroesophageal reflux disease)   . H/O acute sinusitis 10/2016  . Hypertension   . Thyroid enlargement   . Wears glasses     Past Surgical History:  Procedure Laterality Date  . BREAST REDUCTION SURGERY  1994  . BREAST SURGERY N/A    Phreesia 08/26/2020  . CESAREAN SECTION N/A 03/06/2016   Procedure: CESAREAN SECTION;  Surgeon: Ena Dawley, MD;  Location: [place];  Service: Obstetrics;  Laterality: N/A;  . COLONOSCOPY WITH PROPOFOL N/A 10/26/2017   Dr. Oneida Alar: Torturous transverse and sigmoid colon, internal hemorrhoids.  Next colonoscopy in 10 years with MAC and color wrap  . dermoid tumor  2000  . DILITATION &  CURRETTAGE/HYSTROSCOPY WITH NOVASURE ABLATION N/A 12/10/2016   Procedure: DILATATION & CURETTAGE/HYSTEROSCOPY WITH NOVASURE ABLATION;  Surgeon: Ena Dawley, MD;  Location: [facility];  Service: Gynecology;  Laterality: N/A;  . ESOPHAGOGASTRODUODENOSCOPY  07/01/2009   KAJ:GOTLXB esphagus without barrett's/dilation with 16 mm/mild erthyema in the antrum. mild chronic gastritis on path.   . ESOPHAGOGASTRODUODENOSCOPY (EGD) WITH PROPOFOL N/A 10/26/2017   Dr. Oneida Alar: Gastritis, gastric polyps.  Biopsies benign.  Small bowel biopsies negative for celiac.  No H. pylori.  Marland Kitchen HERNIA REPAIR  12/2018  . REDUCTION MAMMAPLASTY     1994  . TRIGGER FINGER RELEASE Bilateral 06/2015  . TUBAL LIGATION Bilateral 03/06/2016   Procedure: BILATERAL TUBAL LIGATION;  Surgeon: Ena Dawley, MD;  Location: [place];  Service: Obstetrics;  Laterality: Bilateral;    Current Outpatient Medications  Medication Sig Dispense Refill  . acetaminophen (TYLENOL) 500 MG tablet Take 1,000 mg every 8 (eight) hours as needed by mouth for mild pain.     . ARIPiprazole (ABILIFY) 5 MG tablet Take 1 tablet (5 mg total) by mouth every evening. 90 tablet 0  . azelastine (ASTELIN) 0.1 % nasal spray Place 2 sprays into both nostrils 2 (two) times daily. Use in each nostril as directed 30 mL 12  . B-D UF III MINI PEN NEEDLES 31G X 5 MM MISC USE AS DIRECTED 100 each 0  .  fluticasone (FLONASE) 50 MCG/ACT nasal spray Place 2 sprays into both nostrils daily. 48 mL 5  . glucose blood test strip Use as instructed, three times daily 300 each 12  . levocetirizine (XYZAL) 5 MG tablet Take 5 mg by mouth every evening.    Marland Kitchen LORazepam (ATIVAN) 0.5 MG tablet Take 1/2 to one tab as needed for severe anxiety 5 tablet 0  . losartan (COZAAR) 50 MG tablet Take 1 tablet (50 mg total) by mouth daily. 90 tablet 1  . Magnesium 200 MG TABS Take 1 tablet by mouth daily.    . metFORMIN (GLUCOPHAGE) 1000 MG tablet TAKE 1 TABLET BY  MOUTH TWICE DAILY WITH MEALS 180 tablet 0  . montelukast (SINGULAIR) 10 MG tablet Take 1 tablet (10 mg total) by mouth at bedtime. 90 tablet 3  . omeprazole (PRILOSEC) 40 MG capsule TAKE 1 CAPSULE BY MOUTH TWICE DAILY BEFORE A MEAL 180 capsule 1  . ONETOUCH DELICA LANCETS 19J MISC Three times daily testing dx e11.9 300 each 5  . OZEMPIC, 1 MG/DOSE, 2 MG/1.5ML SOPN Inject 0.5 mg into the skin once a week.    . rosuvastatin (CRESTOR) 5 MG tablet Take one tablet by mouth three times weekly 36 tablet 3  . sodium chloride (OCEAN) 0.65 % SOLN nasal spray Place 1 spray into both nostrils 4 (four) times daily as needed for congestion.    Tyler Aas FLEXTOUCH 200 UNIT/ML FlexTouch Pen Inject 30 Units into the skin at bedtime.    . Insulin Glargine (LANTUS) 100 UNIT/ML Solostar Pen Inject 50 Units into the skin at bedtime. (Patient not taking: Reported on 12/05/2020) 15 mL 11   No current facility-administered medications for this visit.    Allergies as of 12/05/2020 - Review Complete 12/05/2020  Allergen Reaction Noted  . Other Anaphylaxis 06/13/2013  . 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 Peanut-containing drug products Anaphylaxis 02/08/2013  . Augmentin [amoxicillin-pot clavulanate] Nausea And Vomiting 04/07/2013    Family History  Problem Relation Age of Onset  . Diabetes Mother   . Hypertension Mother   . Fibromyalgia Mother   . Stroke Mother   . Diabetes Father   . Hypertension Father   . Asthma Father   . Heart disease Father   . Depression Father   . Colon cancer Neg Hx     Social History   Socioeconomic History  . Marital status: Married    Spouse name: Not on file  . Number of children: Not on file  . Years of education: Not on file  . Highest education level: Not on file  Occupational History  . Not on file  Tobacco Use  . Smoking status: Never Smoker  . Smokeless tobacco: Never Used  . Tobacco comment: not everyday  Vaping Use  . Vaping Use: Never used  Substance  and Sexual Activity  . Alcohol use: Yes    Alcohol/week: 0.0 standard drinks    Comment: occ  . Drug use: No  . Sexual activity: Yes    Partners: Male    Birth control/protection: Surgical  Other Topics Concern  . Not on file  Social History Narrative  . Not on file   Social Determinants of Health   Financial Resource Strain: Not on file  Food Insecurity: Not on file  Transportation Needs: Not on file  Physical Activity: Not on file  Stress: Not on file  Social Connections: Not on file    Subjective: Review of Systems  Constitutional: Negative for chills and  fever.  HENT: Negative for congestion and hearing loss.   Eyes: Negative for blurred vision and double vision.  Respiratory: Negative for cough and shortness of breath.   Cardiovascular: Negative for chest pain and palpitations.  Gastrointestinal: Positive for diarrhea. Negative for abdominal pain, blood in stool, constipation, heartburn, melena and vomiting.  Genitourinary: Negative for dysuria and urgency.  Musculoskeletal: Negative for joint pain and myalgias.  Skin: Negative for itching and rash.  Neurological: Negative for dizziness and headaches.  Psychiatric/Behavioral: Negative for depression. The patient is not nervous/anxious.      Objective: BP 122/82   Pulse 96   Temp (!) 96.9 F (36.1 C) (Temporal)   Ht _0  (1.651 m)   Wt 227 lb (103 kg)   BMI 37.77 kg/m  Physical Exam Constitutional:      Appearance: Normal appearance. She is obese.  HENT:     Head: Normocephalic and atraumatic.  Eyes:     Extraocular Movements: Extraocular movements intact.     Conjunctiva/sclera: Conjunctivae normal.  Cardiovascular:     Rate and Rhythm: Normal rate and regular rhythm.  Pulmonary:     Effort: Pulmonary effort is normal.     Breath sounds: Normal breath sounds.  Abdominal:     General: Bowel sounds are normal.     Palpations: Abdomen is soft.  Musculoskeletal:        General: No swelling. Normal  range of motion.     Cervical back: Normal range of motion and neck supple.  Skin:    General: Skin is warm and dry.     Coloration: Skin is not jaundiced.  Neurological:     General: No focal deficit present.     Mental Status: She is alert and oriented to person, place, and time.  Psychiatric:        Mood and Affect: Mood normal.        Behavior: Behavior normal.      Assessment: *Chronic reflux-well-controlled on omeprazole *Gastroparesis *Diarrhea  Plan: Patient's chronic reflux much better controlled on omeprazole 40 mg twice daily.  I counseled patient to trial once daily dosing and see how she does.  She can also take her nightly dose if she knows she is going to eat something that will trigger her symptoms such as spicy foods or tomato-based foods.  Gastroparesis well controlled at present.  Diarrhea likely side effect of her recently increased Ozempic.  Hopefully her body adjusts to this over time as it did prior.  Colonoscopy recall 2029 for colon cancer screening  Patient to follow-up in 1 year or sooner if needed.  12/05/2020 8:51 AM   Disclaimer: This note was dictated with voice recognition software. Similar sounding words can inadvertently be transcribed and may not be corrected upon review.

## 2020-12-10 ENCOUNTER — Ambulatory Visit (INDEPENDENT_AMBULATORY_CARE_PROVIDER_SITE_OTHER): Payer: BC Managed Care – PPO | Admitting: Psychology

## 2020-12-10 DIAGNOSIS — F319 Bipolar disorder, unspecified: Secondary | ICD-10-CM

## 2020-12-11 DIAGNOSIS — Z794 Long term (current) use of insulin: Secondary | ICD-10-CM | POA: Diagnosis not present

## 2020-12-11 DIAGNOSIS — E1165 Type 2 diabetes mellitus with hyperglycemia: Secondary | ICD-10-CM | POA: Diagnosis not present

## 2020-12-11 DIAGNOSIS — D3501 Benign neoplasm of right adrenal gland: Secondary | ICD-10-CM | POA: Diagnosis not present

## 2020-12-11 DIAGNOSIS — E559 Vitamin D deficiency, unspecified: Secondary | ICD-10-CM | POA: Diagnosis not present

## 2020-12-11 DIAGNOSIS — E1159 Type 2 diabetes mellitus with other circulatory complications: Secondary | ICD-10-CM | POA: Diagnosis not present

## 2020-12-12 LAB — COMPREHENSIVE METABOLIC PANEL
ALT: 15 IU/L (ref 0–32)
AST: 13 IU/L (ref 0–40)
Albumin/Globulin Ratio: 1.8 (ref 1.2–2.2)
Albumin: 4.9 g/dL — ABNORMAL HIGH (ref 3.8–4.8)
Alkaline Phosphatase: 74 IU/L (ref 44–121)
BUN/Creatinine Ratio: 10 (ref 9–23)
BUN: 8 mg/dL (ref 6–24)
Bilirubin Total: 0.2 mg/dL (ref 0.0–1.2)
CO2: 21 mmol/L (ref 20–29)
Calcium: 10 mg/dL (ref 8.7–10.2)
Chloride: 101 mmol/L (ref 96–106)
Creatinine, Ser: 0.82 mg/dL (ref 0.57–1.00)
Globulin, Total: 2.7 g/dL (ref 1.5–4.5)
Glucose: 126 mg/dL — ABNORMAL HIGH (ref 65–99)
Potassium: 4.7 mmol/L (ref 3.5–5.2)
Sodium: 138 mmol/L (ref 134–144)
Total Protein: 7.6 g/dL (ref 6.0–8.5)
eGFR: 90 mL/min/{1.73_m2} (ref >59)

## 2020-12-12 LAB — LIPID PANEL
Chol/HDL Ratio: 1.6 ratio (ref 0.0–4.4)
Cholesterol, Total: 112 mg/dL (ref 100–199)
HDL: 71 mg/dL (ref >39)
LDL Chol Calc (NIH): 25 mg/dL (ref 0–99)
Triglycerides: 78 mg/dL (ref 0–149)
VLDL Cholesterol Cal: 16 mg/dL (ref 5–40)

## 2020-12-12 LAB — CBC
Hematocrit: 37.8 % (ref 34.0–46.6)
Hemoglobin: 12.7 g/dL (ref 11.1–15.9)
MCH: 30.2 pg (ref 26.6–33.0)
MCHC: 33.6 g/dL (ref 31.5–35.7)
MCV: 90 fL (ref 79–97)
Platelets: 337 10*3/uL (ref 150–450)
RBC: 4.21 x10E6/uL (ref 3.77–5.28)
RDW: 13.2 % (ref 11.7–15.4)
WBC: 5.1 10*3/uL (ref 3.4–10.8)

## 2020-12-12 LAB — VITAMIN D 25 HYDROXY (VIT D DEFICIENCY, FRACTURES): Vit D, 25-Hydroxy: 25.5 ng/mL — ABNORMAL LOW (ref 30.0–100.0)

## 2020-12-12 LAB — MAGNESIUM: Magnesium: 1.9 mg/dL (ref 1.6–2.3)

## 2020-12-17 ENCOUNTER — Other Ambulatory Visit: Payer: Self-pay | Admitting: Family Medicine

## 2020-12-17 MED ORDER — VITAMIN D (ERGOCALCIFEROL) 50000 UNITS PO CAPS: 1.0000 | ORAL_CAPSULE | ORAL | 1 refills | Status: DC

## 2020-12-19 ENCOUNTER — Ambulatory Visit: Payer: BC Managed Care – PPO | Admitting: Psychology

## 2020-12-24 ENCOUNTER — Ambulatory Visit (INDEPENDENT_AMBULATORY_CARE_PROVIDER_SITE_OTHER): Payer: BC Managed Care – PPO | Admitting: Psychology

## 2020-12-24 DIAGNOSIS — F319 Bipolar disorder, unspecified: Secondary | ICD-10-CM

## 2020-12-29 ENCOUNTER — Encounter: Payer: Self-pay | Admitting: Family Medicine

## 2020-12-31 ENCOUNTER — Other Ambulatory Visit: Payer: Self-pay

## 2020-12-31 ENCOUNTER — Telehealth: Payer: BC Managed Care – PPO | Admitting: Family Medicine

## 2020-12-31 ENCOUNTER — Encounter: Payer: Self-pay | Admitting: Family Medicine

## 2020-12-31 VITALS — HR 111 | Ht 64.5 in | Wt 225.0 lb

## 2020-12-31 DIAGNOSIS — U071 COVID-19: Secondary | ICD-10-CM

## 2020-12-31 DIAGNOSIS — E1159 Type 2 diabetes mellitus with other circulatory complications: Secondary | ICD-10-CM

## 2020-12-31 HISTORY — DX: COVID-19: U07.1

## 2020-12-31 MED ORDER — NIRMATRELVIR/RITONAVIR (PAXLOVID)TABLET: 3.0000 | ORAL_TABLET | Freq: Two times a day (BID) | ORAL | 0 refills | Status: AC

## 2020-12-31 NOTE — Assessment & Plan Note (Signed)
Home test positive for covifd on 12/29/2020, needs positive PCR, 5 days of antiviral and work excuse from 05/30 to 01/06/2021

## 2020-12-31 NOTE — Patient Instructions (Addendum)
F/U in 3 weeks, call if you need me sooner  Medication is prescribed for covid  Sent to  [facility] we will follow up with you on avialability as discussed.   Need PCR test done also, verify test site at [facility]  With nurse  Work excuse from 5/31 to 06//01/2021, to be faxed to you  At [phone]

## 2020-12-31 NOTE — Progress Notes (Signed)
Virtual Visit via Telephone Note  I connected with Christina [name] on 12/31/20 at  9:00 AM EDT by telephone and verified that I am speaking with the correct person using two identifiers.  Location: Patient: home Provider: office    I discussed the limitations, risks, security and privacy concerns of performing an evaluation and management service by telephone and the availability of in person appointments. I also discussed with the patient that there may be a patient responsible charge related to this service. The patient expressed understanding and agreed to proceed.   History of Present Illness: Poisitive covid and symprtomatic x  2days, all of the family is infected. She is at increased risk for complications due to diabetes and obesity  Observations/Objective: Pulse (!) 111   Ht 5' 4.5" (1.638 m)   Wt 225 lb (102.1 kg)   SpO2 97%   BMI 38.02 kg/m  Good communication with no confusion and intact memory. Alert and oriented x 3 No signs of respiratory distress during speech    Assessment and Plan:  Lab test positive for detection of COVID-19 virus Home test positive for covifd on 12/29/2020, needs positive PCR, 5 days of antiviral and work excuse from 05/30 to 01/06/2021  Morbid obesity ([place])  Patient re-educated about  the importance of commitment to a  minimum of 150 minutes of exercise per week as able.  The importance of healthy food choices with portion control discussed, as well as eating regularly and within a 12 hour window most days. The need to choose "clean , green" food 50 to 75% of the time is discussed, as well as to make water the primary drink and set a goal of 64 ounces water daily.    Weight /BMI 12/31/2020 12/05/2020 08/26/2020  WEIGHT 225 lb 227 lb 221 lb  HEIGHT 5' 4.5" 5\' 5"  5' 4.5"  BMI 38.02 kg/m2 37.77 kg/m2 37.35 kg/m2  Some encounter information is confidential and restricted. Go to Review Flowsheets activity to see all data.      Type 2 diabetes  mellitus with vascular disease ([place]) Christina [name] is reminded of the importance of commitment to daily physical activity for 30 minutes or more, as able and the need to limit carbohydrate intake to 30 to 60 grams per meal to help with blood sugar control.   The need to take medication as prescribed, test blood sugar as directed, and to call between visits if there is a concern that blood sugar is uncontrolled is also discussed.   Christina [name] is reminded of the importance of daily foot exam, annual eye examination, and good blood sugar, blood pressure and cholesterol control. Managed by Endo at [facility] and controlled   Diabetic Labs Latest Ref Rng & Units 12/11/2020 09/18/2020 01/12/2020 12/22/2019 06/02/2019  HbA1c - - - - 9.4 7.5(H)  Microalbumin Not Estab. ug/mL - - - - -  Micro/Creat Ratio 0 - 29 mg/g creat - - - - 42(H)  Chol 100 - 199 mg/dL 112 122 124 - 157  HDL >39 mg/dL 71 63 66 - 58  Calc LDL 0 - 99 mg/dL 25 43 41 - 78  Triglycerides 0 - 149 mg/dL 78 85 88 - 118  Creatinine 0.57 - 1.00 mg/dL 0.82 0.82 0.83 - 1.11(H)   BP/Weight 12/31/2020 12/05/2020 08/26/2020 07/30/2020 07/01/2020 05/08/2020 01/0/2725  Systolic BP - 366 440 347 425 956 387  Diastolic BP - 82 91 89 86 80 82  Wt. (Lbs) 225 227 221 226 226.04 231.6 233  BMI 38.02 37.77 37.35 37.61 38.2 38.54 38.77  Some encounter information is confidential and restricted. Go to Review Flowsheets activity to see all data.   Foot/eye exam completion dates Latest Ref Rng & Units 05/07/2020 02/14/2020  Eye Exam No Retinopathy - No Retinopathy  Foot Form Completion - Done -         Follow Up Instructions:    I discussed the assessment and treatment plan with the patient. The patient was provided an opportunity to ask questions and all were answered. The patient agreed with the plan and demonstrated an understanding of the instructions.   The patient was advised to call back or seek an in-person evaluation if the symptoms worsen or if the  condition fails to improve as anticipated.  I provided 8 minutes of non-face-to-face time during this encounter.   Tula Nakayama, MD

## 2020-12-31 NOTE — Telephone Encounter (Signed)
Pt made virtual visit with simpson 12-31-20

## 2021-01-01 DIAGNOSIS — J029 Acute pharyngitis, unspecified: Secondary | ICD-10-CM | POA: Diagnosis not present

## 2021-01-01 DIAGNOSIS — R519 Headache, unspecified: Secondary | ICD-10-CM | POA: Diagnosis not present

## 2021-01-01 DIAGNOSIS — J3489 Other specified disorders of nose and nasal sinuses: Secondary | ICD-10-CM | POA: Diagnosis not present

## 2021-01-01 DIAGNOSIS — Z20828 Contact with and (suspected) exposure to other viral communicable diseases: Secondary | ICD-10-CM | POA: Diagnosis not present

## 2021-01-03 DIAGNOSIS — U071 COVID-19: Secondary | ICD-10-CM | POA: Diagnosis not present

## 2021-01-03 DIAGNOSIS — R519 Headache, unspecified: Secondary | ICD-10-CM | POA: Diagnosis not present

## 2021-01-05 ENCOUNTER — Encounter: Payer: Self-pay | Admitting: Family Medicine

## 2021-01-06 ENCOUNTER — Encounter: Payer: Self-pay | Admitting: Family Medicine

## 2021-01-06 ENCOUNTER — Encounter: Payer: Self-pay | Admitting: Nurse Practitioner

## 2021-01-06 ENCOUNTER — Other Ambulatory Visit: Payer: Self-pay

## 2021-01-06 ENCOUNTER — Telehealth: Payer: BC Managed Care – PPO | Admitting: Nurse Practitioner

## 2021-01-06 DIAGNOSIS — U071 COVID-19: Secondary | ICD-10-CM | POA: Diagnosis not present

## 2021-01-06 MED ORDER — AZITHROMYCIN 250 MG PO TABS: ORAL_TABLET | ORAL | 0 refills | Status: DC

## 2021-01-06 MED ORDER — PREDNISONE 20 MG PO TABS: 40.0000 mg | ORAL_TABLET | Freq: Every day | ORAL | 0 refills | Status: AC

## 2021-01-06 NOTE — Assessment & Plan Note (Signed)
-  tested positive sometime between 12/29/20 and 01/01/21 and is still symptomatic -Rx. z-pack and prednisone -she will take OTC norel that she has at home for her congestion -work note through the week, but can return sooner if she has no symptoms without medicine x24 hrs

## 2021-01-06 NOTE — Assessment & Plan Note (Signed)
Christina [name] is reminded of the importance of commitment to daily physical activity for 30 minutes or more, as able and the need to limit carbohydrate intake to 30 to 60 grams per meal to help with blood sugar control.   The need to take medication as prescribed, test blood sugar as directed, and to call between visits if there is a concern that blood sugar is uncontrolled is also discussed.   Christina [name] is reminded of the importance of daily foot exam, annual eye examination, and good blood sugar, blood pressure and cholesterol control. Managed by Endo at [facility] and controlled   Diabetic Labs Latest Ref Rng & Units 12/11/2020 09/18/2020 01/12/2020 12/22/2019 06/02/2019  HbA1c - - - - 9.4 7.5(H)  Microalbumin Not Estab. ug/mL - - - - -  Micro/Creat Ratio 0 - 29 mg/g creat - - - - 42(H)  Chol 100 - 199 mg/dL 112 122 124 - 157  HDL >39 mg/dL 71 63 66 - 58  Calc LDL 0 - 99 mg/dL 25 43 41 - 78  Triglycerides 0 - 149 mg/dL 78 85 88 - 118  Creatinine 0.57 - 1.00 mg/dL 0.82 0.82 0.83 - 1.11(H)   BP/Weight 12/31/2020 12/05/2020 08/26/2020 07/30/2020 07/01/2020 05/08/2020 95/01/4733  Systolic BP - 037 096 438 381 840 375  Diastolic BP - 82 91 89 86 80 82  Wt. (Lbs) 225 227 221 226 226.04 231.6 233  BMI 38.02 37.77 37.35 37.61 38.2 38.54 38.77  Some encounter information is confidential and restricted. Go to Review Flowsheets activity to see all data.   Foot/eye exam completion dates Latest Ref Rng & Units 05/07/2020 02/14/2020  Eye Exam No Retinopathy - No Retinopathy  Foot Form Completion - Done -

## 2021-01-06 NOTE — Assessment & Plan Note (Signed)
  Patient re-educated about  the importance of commitment to a  minimum of 150 minutes of exercise per week as able.  The importance of healthy food choices with portion control discussed, as well as eating regularly and within a 12 hour window most days. The need to choose "clean , green" food 50 to 75% of the time is discussed, as well as to make water the primary drink and set a goal of 64 ounces water daily.    Weight /BMI 12/31/2020 12/05/2020 08/26/2020  WEIGHT 225 lb 227 lb 221 lb  HEIGHT 5' 4.5" 5\' 5"  5' 4.5"  BMI 38.02 kg/m2 37.77 kg/m2 37.35 kg/m2  Some encounter information is confidential and restricted. Go to Review Flowsheets activity to see all data.

## 2021-01-06 NOTE — Progress Notes (Signed)
Acute Office Visit  Subjective:    Patient ID: Christina [name], female    DOB: [birth year], 45 y.o.   MRN: [id number]  Chief Complaint  Patient presents with  . Covid Positive    Pt covid positive 12-31-20 has taken the paxlovid and tylenol however this is not working and she still feels terrible. Still has fatigue sore throat and sinus drainage     HPI Patient is in today for COVID-19. She tested positive on 12/29/20. She has taken paxlovid and tylenol, but is still having sore throat and rhinorrhea. She has tried no other OTC medications.  Past Medical History:  Diagnosis Date  . Anemia   . Bipolar disorder ([place])   . Depression   . Diabetes mellitus    type 2 DM, insulin only needed during pregnancy  . Diabetes mellitus without complication ([place])    Phreesia 08/26/2020  . Gastroparesis   . 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 GERD (gastroesophageal reflux disease)   . H/O acute sinusitis 10/2016  . Hypertension   . Thyroid enlargement   . Wears glasses     Past Surgical History:  Procedure Laterality Date  . BREAST REDUCTION SURGERY  1994  . BREAST SURGERY N/A    Phreesia 08/26/2020  . CESAREAN SECTION N/A 03/06/2016   Procedure: CESAREAN SECTION;  Surgeon: Ena Dawley, MD;  Location: [place];  Service: Obstetrics;  Laterality: N/A;  . COLONOSCOPY WITH PROPOFOL N/A 10/26/2017   Dr. Oneida Alar: Torturous transverse and sigmoid colon, internal hemorrhoids.  Next colonoscopy in 10 years with MAC and color wrap  . dermoid tumor  2000  . DILITATION & CURRETTAGE/HYSTROSCOPY WITH NOVASURE ABLATION N/A 12/10/2016   Procedure: DILATATION & CURETTAGE/HYSTEROSCOPY WITH NOVASURE ABLATION;  Surgeon: Ena Dawley, MD;  Location: [facility];  Service: Gynecology;  Laterality: N/A;  . ESOPHAGOGASTRODUODENOSCOPY  07/01/2009   IRJ:JOACZY esphagus without barrett's/dilation with 16 mm/mild erthyema in the antrum. mild chronic gastritis on path.   .  ESOPHAGOGASTRODUODENOSCOPY (EGD) WITH PROPOFOL N/A 10/26/2017   Dr. Oneida Alar: Gastritis, gastric polyps.  Biopsies benign.  Small bowel biopsies negative for celiac.  No H. pylori.  Marland Kitchen HERNIA REPAIR  12/2018  . REDUCTION MAMMAPLASTY     1994  . TRIGGER FINGER RELEASE Bilateral 06/2015  . TUBAL LIGATION Bilateral 03/06/2016   Procedure: BILATERAL TUBAL LIGATION;  Surgeon: Ena Dawley, MD;  Location: [facility];  Service: Obstetrics;  Laterality: Bilateral;    Family History  Problem Relation Age of Onset  . Diabetes Mother   . Hypertension Mother   . Fibromyalgia Mother   . Stroke Mother   . Diabetes Father   . Hypertension Father   . Asthma Father   . Heart disease Father   . Depression Father   . Colon cancer Neg Hx     Social History   Socioeconomic History  . Marital status: Married    Spouse name: Not on file  . Number of children: Not on file  . Years of education: Not on file  . Highest education level: Not on file  Occupational History  . Not on file  Tobacco Use  . Smoking status: Never Smoker  . Smokeless tobacco: Never Used  . Tobacco comment: not everyday  Vaping Use  . Vaping Use: Never used  Substance and Sexual Activity  . Alcohol use: Yes    Alcohol/week: 0.0 standard drinks    Comment: occ  . Drug use: No  .  Sexual activity: Yes    Partners: Male    Birth control/protection: Surgical  Other Topics Concern  . Not on file  Social History Narrative  . Not on file   Social Determinants of Health   Financial Resource Strain: Not on file  Food Insecurity: Not on file  Transportation Needs: Not on file  Physical Activity: Not on file  Stress: Not on file  Social Connections: Not on file  Intimate Partner Violence: Not on file    Outpatient Medications Prior to Visit  Medication Sig Dispense Refill  . acetaminophen (TYLENOL) 500 MG tablet Take 1,000 mg every 8 (eight) hours as needed by mouth for mild pain.     . ARIPiprazole (ABILIFY)  5 MG tablet Take 1 tablet (5 mg total) by mouth every evening. 90 tablet 0  . azelastine (ASTELIN) 0.1 % nasal spray Place 2 sprays into both nostrils 2 (two) times daily. Use in each nostril as directed 30 mL 12  . B-D UF III MINI PEN NEEDLES 31G X 5 MM MISC USE AS DIRECTED 100 each 0  . fluticasone (FLONASE) 50 MCG/ACT nasal spray Place 2 sprays into both nostrils daily. 48 mL 5  . glucose blood test strip Use as instructed, three times daily 300 each 12  . levocetirizine (XYZAL) 5 MG tablet Take 5 mg by mouth every evening.    Marland Kitchen LORazepam (ATIVAN) 0.5 MG tablet Take 1/2 to one tab as needed for severe anxiety 5 tablet 0  . losartan (COZAAR) 50 MG tablet Take 1 tablet (50 mg total) by mouth daily. 90 tablet 1  . Magnesium 200 MG TABS Take 1 tablet by mouth daily.    . metFORMIN (GLUCOPHAGE) 1000 MG tablet TAKE 1 TABLET BY MOUTH TWICE DAILY WITH MEALS 180 tablet 0  . montelukast (SINGULAIR) 10 MG tablet Take 1 tablet (10 mg total) by mouth at bedtime. 90 tablet 3  . omeprazole (PRILOSEC) 40 MG capsule TAKE 1 CAPSULE BY MOUTH TWICE DAILY BEFORE A MEAL 180 capsule 1  . ONETOUCH DELICA LANCETS 32I MISC Three times daily testing dx e11.9 300 each 5  . OZEMPIC, 1 MG/DOSE, 2 MG/1.5ML SOPN Inject 0.5 mg into the skin once a week.    . rosuvastatin (CRESTOR) 5 MG tablet Take one tablet by mouth three times weekly 36 tablet 3  . sodium chloride (OCEAN) 0.65 % SOLN nasal spray Place 1 spray into both nostrils 4 (four) times daily as needed for congestion.    Tyler Aas FLEXTOUCH 200 UNIT/ML FlexTouch Pen Inject 30 Units into the skin at bedtime.    . Vitamin D, Ergocalciferol, 50000 units CAPS Take 1 capsule by mouth once a week. 12 capsule 1   No facility-administered medications prior to visit.    Allergies  Allergen Reactions  . Other Anaphylaxis    Tree nuts  . Peanut Oil Anaphylaxis  . Peanut-Containing Drug Products Anaphylaxis  . Augmentin [Amoxicillin-Pot Clavulanate] Nausea And Vomiting     Causes stomach cramps. Patient can take amoxicillin, not Augmentin. Has patient had a PCN reaction causing immediate rash, facial/tongue/throat swelling, SOB or lightheadedness with hypotension: No Has patient had a PCN reaction causing severe rash involving mucus membranes or skin necrosis: No Has patient had a PCN reaction that required hospitalization: No Has patient had a PCN reaction occurring within the last 10 years: No If all of the above answers are "NO", then may proceed with Cephalospori    Review of Systems  Constitutional: Positive for chills, fatigue  and fever.  HENT: Positive for congestion, rhinorrhea, sinus pressure, sinus pain, sneezing and sore throat.   Respiratory: Positive for cough. Negative for chest tightness, shortness of breath and wheezing.   Cardiovascular: Negative.        Objective:    Physical Exam  There were no vitals taken for this visit. Wt Readings from Last 3 Encounters:  12/31/20 225 lb (102.1 kg)  12/05/20 227 lb (103 kg)  08/26/20 221 lb (100.2 kg)    Health Maintenance Due  Topic Date Due  . Pneumococcal Vaccine 47-47 Years old (1 of 2 - PPSV23) Never done  . HEMOGLOBIN A1C  10/17/2020    There are no preventive care reminders to display for this patient.   Lab Results  Component Value Date   TSH 1.420 01/12/2020   Lab Results  Component Value Date   WBC 5.1 12/11/2020   HGB 12.7 12/11/2020   HCT 37.8 12/11/2020   MCV 90 12/11/2020   PLT 337 12/11/2020   Lab Results  Component Value Date   NA 138 12/11/2020   K 4.7 12/11/2020   CO2 21 12/11/2020   GLUCOSE 126 (H) 12/11/2020   BUN 8 12/11/2020   CREATININE 0.82 12/11/2020   BILITOT 0.2 12/11/2020   ALKPHOS 74 12/11/2020   AST 13 12/11/2020   ALT 15 12/11/2020   PROT 7.6 12/11/2020   ALBUMIN 4.9 (H) 12/11/2020   CALCIUM 10.0 12/11/2020   ANIONGAP 7 06/28/2017   EGFR 90 12/11/2020   Lab Results  Component Value Date   CHOL 112 12/11/2020   Lab Results   Component Value Date   HDL 71 12/11/2020   Lab Results  Component Value Date   LDLCALC 25 12/11/2020   Lab Results  Component Value Date   TRIG 78 12/11/2020   Lab Results  Component Value Date   CHOLHDL 1.6 12/11/2020   Lab Results  Component Value Date   HGBA1C 9.4 12/22/2019       Assessment & Plan:   Problem List Items Addressed This Visit      Other   [id number] - Primary    -tested positive sometime between 12/29/20 and 01/01/21 and is still symptomatic -Rx. z-pack and prednisone -she will take OTC norel that she has at home for her congestion -work note through the week, but can return sooner if she has no symptoms without medicine x24 hrs      Relevant Medications   azithromycin (ZITHROMAX) 250 MG tablet   predniSONE (DELTASONE) 20 MG tablet       Meds ordered this encounter  Medications  . azithromycin (ZITHROMAX) 250 MG tablet    Sig: Please dispense as a z-pack    Dispense:  6 tablet    Refill:  0  . predniSONE (DELTASONE) 20 MG tablet    Sig: Take 2 tablets (40 mg total) by mouth daily with breakfast for 5 days.    Dispense:  10 tablet    Refill:  0   Date:  01/06/2021   Location of Patient: Home Location of Provider: Office Consent was obtain for visit to be over via telehealth. I verified that I am speaking with the correct person using two identifiers.  I connected with  Trishia A Secrist on 01/06/21 via telephone and verified that I am speaking with the correct person using two identifiers.   I discussed the limitations of evaluation and management by telemedicine. The patient expressed understanding and agreed to proceed.  Time spent: 9 minutes  Noreene Larsson, NP

## 2021-01-06 NOTE — Telephone Encounter (Signed)
Pt made a work in appt 01-06-21 with Pearline Cables

## 2021-01-08 ENCOUNTER — Encounter: Payer: Self-pay | Admitting: Nurse Practitioner

## 2021-01-21 ENCOUNTER — Ambulatory Visit (INDEPENDENT_AMBULATORY_CARE_PROVIDER_SITE_OTHER): Payer: BC Managed Care – PPO | Admitting: Psychology

## 2021-01-21 DIAGNOSIS — F319 Bipolar disorder, unspecified: Secondary | ICD-10-CM

## 2021-01-22 ENCOUNTER — Ambulatory Visit: Payer: BC Managed Care – PPO | Admitting: Family Medicine

## 2021-01-23 ENCOUNTER — Telehealth (HOSPITAL_COMMUNITY): Payer: BC Managed Care – PPO | Admitting: Psychiatry

## 2021-01-29 ENCOUNTER — Other Ambulatory Visit: Payer: Self-pay | Admitting: Family Medicine

## 2021-01-29 DIAGNOSIS — Z1231 Encounter for screening mammogram for malignant neoplasm of breast: Secondary | ICD-10-CM

## 2021-02-11 ENCOUNTER — Ambulatory Visit (INDEPENDENT_AMBULATORY_CARE_PROVIDER_SITE_OTHER): Payer: BC Managed Care – PPO | Admitting: Psychology

## 2021-02-11 DIAGNOSIS — F319 Bipolar disorder, unspecified: Secondary | ICD-10-CM | POA: Diagnosis not present

## 2021-02-12 ENCOUNTER — Ambulatory Visit: Payer: BC Managed Care – PPO | Admitting: Family Medicine

## 2021-02-17 DIAGNOSIS — A6004 Herpesviral vulvovaginitis: Secondary | ICD-10-CM | POA: Diagnosis not present

## 2021-02-17 DIAGNOSIS — Z01419 Encounter for gynecological examination (general) (routine) without abnormal findings: Secondary | ICD-10-CM | POA: Diagnosis not present

## 2021-02-17 DIAGNOSIS — N939 Abnormal uterine and vaginal bleeding, unspecified: Secondary | ICD-10-CM | POA: Diagnosis not present

## 2021-02-17 DIAGNOSIS — Z6838 Body mass index (BMI) 38.0-38.9, adult: Secondary | ICD-10-CM | POA: Diagnosis not present

## 2021-02-25 ENCOUNTER — Ambulatory Visit (INDEPENDENT_AMBULATORY_CARE_PROVIDER_SITE_OTHER): Payer: BC Managed Care – PPO | Admitting: Psychology

## 2021-02-25 DIAGNOSIS — F319 Bipolar disorder, unspecified: Secondary | ICD-10-CM | POA: Diagnosis not present

## 2021-02-26 ENCOUNTER — Telehealth (INDEPENDENT_AMBULATORY_CARE_PROVIDER_SITE_OTHER): Payer: BC Managed Care – PPO | Admitting: Psychiatry

## 2021-02-26 ENCOUNTER — Other Ambulatory Visit: Payer: Self-pay

## 2021-02-26 ENCOUNTER — Encounter (HOSPITAL_COMMUNITY): Payer: Self-pay | Admitting: Psychiatry

## 2021-02-26 DIAGNOSIS — F419 Anxiety disorder, unspecified: Secondary | ICD-10-CM | POA: Diagnosis not present

## 2021-02-26 DIAGNOSIS — F3162 Bipolar disorder, current episode mixed, moderate: Secondary | ICD-10-CM

## 2021-02-26 MED ORDER — ARIPIPRAZOLE 5 MG PO TABS: 5.0000 mg | ORAL_TABLET | Freq: Every evening | ORAL | 0 refills | Status: DC

## 2021-02-26 NOTE — Progress Notes (Signed)
Virtual Visit via Telephone Note  I connected with Christina [name] on 02/26/21 at  4:20 PM EDT by telephone and verified that I am speaking with the correct person using two identifiers.  Location: Patient: Home Provider: Home Office   I discussed the limitations, risks, security and privacy concerns of performing an evaluation and management service by telephone and the availability of in person appointments. I also discussed with the patient that there may be a patient responsible charge related to this service. The patient expressed understanding and agreed to proceed.   History of Present Illness: Patient is evaluated by phone session.  She apologized missing last appointment because she was busy at a seminar and could not return appointment.  Patient admitted for past few weeks she was very stressed and anxious about her job and keeping her summer classes.  She is doing masters in education /counseling through [employer] but decided to take a semester off because it is too much and overwhelming having a full-time job.  Since she got a promotion she has more responsibilities.  She admitted seeing back-to-back Leeann but now feeling better since she made the decision.  Finally she got the appointment for her son to rule out autism.  Overall she feels things are going better and her symptoms are manageable as lately talking to [place] helps.  She is sleeping good.  She recently had a blood work at [facility] and found to have hemoglobin A1c 7.6 but may need more work-up and see endocrinologist for adrenal adenoma.  She denies any mania, psychosis, hallucination.  She denies any agitation or any impulsive behavior.  She reported her husband is now hoping to take over her mother-in-law business of group homes because she is thinking to get her tired.  Patient has no tremors or shakes or any EPS.  He does not want to change the medications as it is working well.  She has not taken any benzodiazepine for more than a year.   Her appetite is okay.  Her energy level is good.   Past Psychiatric History:  H/O depression, anxiety, mania and psychosis.  Admitted in [facility] at [facility] due to suicidal gestures.  Seen in this office since 2006. Tried Prozac and Wellbutrin.    Recent Results (from the past 2160 hour(s))  Magnesium     Status: None   Collection Time: 12/11/20  8:37 AM  Result Value Ref Range   Magnesium 1.9 1.6 - 2.3 mg/dL  VITAMIN D 25 Hydroxy (Vit-D Deficiency, Fractures)     Status: Abnormal   Collection Time: 12/11/20  8:37 AM  Result Value Ref Range   Vit D, 25-Hydroxy 25.5 (L) 30.0 - 100.0 ng/mL    Comment: Vitamin D deficiency has been defined by the [place] practice guideline as a level of serum 25-OH vitamin D less than 20 ng/mL (1,2). The Endocrine Society went on to further define vitamin D insufficiency as a level between 21 and 29 ng/mL (2). 1. IOM (Institute of Medicine). 2010. Dietary reference    intakes for calcium and D. [place]: The    [employer]. 2. Holick MF, Binkley [place], Bischoff-Ferrari HA, et al.    Evaluation, treatment, and prevention of vitamin D    deficiency: an Endocrine Society clinical practice    guideline. JCEM. 2011 Jul; 96(7):1911-30.   Comprehensive metabolic panel     Status: Abnormal   Collection Time: 12/11/20  8:37 AM  Result Value Ref Range  Glucose 126 (H) 65 - 99 mg/dL   BUN 8 6 - 24 mg/dL   Creatinine, Ser 0.82 0.57 - 1.00 mg/dL   eGFR 90 >59 mL/min/1.73   BUN/Creatinine Ratio 10 9 - 23   Sodium 138 134 - 144 mmol/L   Potassium 4.7 3.5 - 5.2 mmol/L   Chloride 101 96 - 106 mmol/L   CO2 21 20 - 29 mmol/L   Calcium 10.0 8.7 - 10.2 mg/dL   Total Protein 7.6 6.0 - 8.5 g/dL   Albumin 4.9 (H) 3.8 - 4.8 g/dL   Globulin, Total 2.7 1.5 - 4.5 g/dL   Albumin/Globulin Ratio 1.8 1.2 - 2.2   Bilirubin Total 0.2 0.0 - 1.2 mg/dL   Alkaline Phosphatase 74 44 - 121 IU/L   AST 13 0 - 40 IU/L   ALT 15 0 - 32  IU/L  CBC     Status: None   Collection Time: 12/11/20  8:37 AM  Result Value Ref Range   WBC 5.1 3.4 - 10.8 x10E3/uL   RBC 4.21 3.77 - 5.28 x10E6/uL   Hemoglobin 12.7 11.1 - 15.9 g/dL   Hematocrit 37.8 34.0 - 46.6 %   MCV 90 79 - 97 fL   MCH 30.2 26.6 - 33.0 pg   MCHC 33.6 31.5 - 35.7 g/dL   RDW 13.2 11.7 - 15.4 %   Platelets 337 150 - 450 x10E3/uL  Lipid panel     Status: None   Collection Time: 12/11/20  8:37 AM  Result Value Ref Range   Cholesterol, Total 112 100 - 199 mg/dL   Triglycerides 78 0 - 149 mg/dL   HDL 71 >39 mg/dL   VLDL Cholesterol Cal 16 5 - 40 mg/dL   LDL Chol Calc (NIH) 25 0 - 99 mg/dL   Chol/HDL Ratio 1.6 0.0 - 4.4 ratio    Comment:                                   T. Chol/HDL Ratio                                             Men  Women                               1/2 Avg.Risk  3.4    3.3                                   Avg.Risk  5.0    4.4                                2X Avg.Risk  9.6    7.1                                3X Avg.Risk 23.4   11.0      Psychiatric Specialty Exam: Physical Exam  Review of Systems  Weight 225 lb (102.1 kg), unknown if currently breastfeeding.There is no height or weight on file to calculate BMI.  General Appearance: NA  Eye Contact:  NA  Speech:  Normal Rate  Volume:  Normal  Mood:  Euthymic  Affect:  NA  Thought Process:  Coherent  Orientation:  Full (Time, Place, and Person)  Thought Content:  WDL  Suicidal Thoughts:  No  Homicidal Thoughts:  No  Memory:  Immediate;   Good Recent;   Good Remote;   Good  Judgement:  Intact  Insight:  Present  Psychomotor Activity:  NA  Concentration:  Concentration: Good and Attention Span: Good  Recall:  Good  Fund of Knowledge:  Good  Language:  Good  Akathisia:  No  Handed:  Right  AIMS (if indicated):     Assets:  Communication Skills Desire for [place] Talents/Skills Transportation  ADL's:  Intact  Cognition:  WNL   Sleep:   ok      Assessment and Plan: Bipolar disorder type I.  Anxiety.  I reviewed blood work results.  Her last hemoglobin A1c which was done in May is 7.6.  She like to keep Abilify which is helping her mania, hallucination and mood swings.  Discussed medication side effects and benefits.  Encouraged to continue therapy with Ar[facility] Endo.  Recommended to call us back if she is any question or any concern.  Follow-up in 3 months.  Follow Up Instructions:    I discussed the assessment and treatment plan with the patient. The patient was provided an opportunity to ask questions and all were answered. The patient agreed with the plan and demonstrated an understanding of the instructions.   The patient was advised to call back or seek an in-person evaluation if the symptoms worsen or if the condition fails to improve as anticipated.  I provided 15 minutes of non-face-to-face time during this encounter.   Kathlee Nations, MD

## 2021-02-28 DIAGNOSIS — N939 Abnormal uterine and vaginal bleeding, unspecified: Secondary | ICD-10-CM | POA: Diagnosis not present

## 2021-03-03 DIAGNOSIS — E119 Type 2 diabetes mellitus without complications: Secondary | ICD-10-CM | POA: Diagnosis not present

## 2021-03-18 ENCOUNTER — Ambulatory Visit: Payer: BC Managed Care – PPO | Admitting: Psychology

## 2021-03-26 ENCOUNTER — Encounter: Payer: Self-pay | Admitting: Family Medicine

## 2021-03-26 ENCOUNTER — Other Ambulatory Visit: Payer: Self-pay

## 2021-03-26 ENCOUNTER — Ambulatory Visit (INDEPENDENT_AMBULATORY_CARE_PROVIDER_SITE_OTHER): Payer: BC Managed Care – PPO | Admitting: Family Medicine

## 2021-03-26 VITALS — BP 130/75 | HR 99 | Resp 16 | Ht 64.5 in | Wt 226.1 lb

## 2021-03-26 DIAGNOSIS — I10 Essential (primary) hypertension: Secondary | ICD-10-CM | POA: Diagnosis not present

## 2021-03-26 DIAGNOSIS — J302 Other seasonal allergic rhinitis: Secondary | ICD-10-CM

## 2021-03-26 DIAGNOSIS — E1165 Type 2 diabetes mellitus with hyperglycemia: Secondary | ICD-10-CM

## 2021-03-26 DIAGNOSIS — Z794 Long term (current) use of insulin: Secondary | ICD-10-CM

## 2021-03-26 DIAGNOSIS — F4321 Adjustment disorder with depressed mood: Secondary | ICD-10-CM

## 2021-03-26 DIAGNOSIS — K219 Gastro-esophageal reflux disease without esophagitis: Secondary | ICD-10-CM

## 2021-03-26 DIAGNOSIS — F3162 Bipolar disorder, current episode mixed, moderate: Secondary | ICD-10-CM

## 2021-03-26 DIAGNOSIS — Z23 Encounter for immunization: Secondary | ICD-10-CM

## 2021-03-26 LAB — POCT GLYCOSYLATED HEMOGLOBIN (HGB A1C): HbA1c, POC (controlled diabetic range): 7.8 % — AB (ref 0.0–7.0)

## 2021-03-26 NOTE — Patient Instructions (Addendum)
F/U in January, call if you  need me sooner Flu vaccine today  Please get fasting lipid, cmp and eGF, hBA1C, mg, tSH, vit D and microalb Nov 24 or after Blood sugar less well controlled, please work on improving this  My condolence and prayers  Please get covid  booster  Excellent foot exam  It is important that you exercise regularly at least 30 minutes 5 times a week. If you develop chest pain, have severe difficulty breathing, or feel very tired, stop exercising immediately and seek medical attention   Thanks for choosing [facility], we consider it a privelige to serve you.

## 2021-03-27 ENCOUNTER — Ambulatory Visit: Payer: BC Managed Care – PPO | Admitting: Family Medicine

## 2021-03-27 DIAGNOSIS — F4321 Adjustment disorder with depressed mood: Secondary | ICD-10-CM | POA: Insufficient documentation

## 2021-03-27 NOTE — Assessment & Plan Note (Signed)
Controlled, no change in medication  

## 2021-03-27 NOTE — Progress Notes (Signed)
Christina [name]     MRN: [id number]      DOB: 1976-01-03   HPI Christina [name] is here for follow up and re-evaluation of chronic medical conditions, medication management and review of any available recent lab and radiology data.  Preventive health is updated, specifically  Cancer screening and Immunization.   Questions or concerns regarding consultations or procedures which the PT has had in the interim are  addressed. The PT denies any adverse reactions to current medications since the last visit.  Grieving unexpected loss of her dad less than 1 week ago Blood sugar fluctuating in past 4 weeks Denies polyuria, polydipsia, blurred vision , or hypoglycemic episodes.   ROS Denies recent fever or chills. Denies uncontrolled  sinus pressure, nasal congestion, ear pain or sore throat. Denies chest congestion, productive cough or wheezing. Denies chest pains, palpitations and leg swelling Denies abdominal pain, nausea, vomiting,diarrhea or constipation.   Denies dysuria, frequency, hesitancy or incontinence. Denies joint pain, swelling and limitation in mobility. Denies headaches, seizures, numbness, or tingling. Denies skin break down or rash.   PE  BP 130/75   Pulse 99   Resp 16   Ht 5' 4.5" (1.638 m)   Wt 226 lb 1.9 oz (102.6 kg)   SpO2 96%   BMI 38.21 kg/m   Patient alert and oriented and in no cardiopulmonary distress.  HEENT: No facial asymmetry, EOMI,     Neck supple .  Chest: Clear to auscultation bilaterally.  CVS: S1, S2 no murmurs, no S3.Regular rate.  ABD: Soft non tender.   Ext: No edema  MS: Adequate ROM spine, shoulders, hips and knees.  Skin: Intact, no ulcerations or rash noted.  Psych: Good eye contact, normal affect. Memory intact not anxious or depressed appearing.  CNS: CN 2-12 intact, power,  normal throughout.no focal deficits noted.   Assessment & Plan  Essential hypertension, benign Controlled, no change in medication DASH diet and commitment  to daily physical activity for a minimum of 30 minutes discussed and encouraged, as a part of hypertension management. The importance of attaining a healthy weight is also discussed.  BP/Weight 03/26/2021 12/31/2020 12/05/2020 08/26/2020 07/30/2020 07/01/2020 0000000  Systolic BP AB-123456789 - 123XX123 123XX123 [id number] 0000000 123XX123  Diastolic BP 75 - 82 91 89 86 80  Wt. (Lbs) 226.12 225 227 221 226 226.04 231.6  BMI 38.21 38.02 37.77 37.35 37.61 38.2 38.54  Some encounter information is confidential and restricted. Go to Review Flowsheets activity to see all data.       Seasonal allergies Controlled, no change in medication   Bipolar 1 disorder, mixed, moderate ([place]) Managed by Psych and stable  Diabetes mellitus ([place]) Deteriorated Managed by Endo at [facility], needs to be more diligent with diet and exercise Christina [name] is reminded of the importance of commitment to daily physical activity for 30 minutes or more, as able and the need to limit carbohydrate intake to 30 to 60 grams per meal to help with blood sugar control.   The need to take medication as prescribed, test blood sugar as directed, and to call between visits if there is a concern that blood sugar is uncontrolled is also discussed.   Christina [name] is reminded of the importance of daily foot exam, annual eye examination, and good blood sugar, blood pressure and cholesterol control.  Diabetic Labs Latest Ref Rng & Units 03/26/2021 12/11/2020 09/18/2020 01/12/2020 12/22/2019  HbA1c 0.0 - 7.0 % 7.8(A) - - - 9.4  Microalbumin Not Estab.  ug/mL - - - - -  Micro/Creat Ratio 0 - 29 mg/g creat - - - - -  Chol 100 - 199 mg/dL - 112 122 124 -  HDL >39 mg/dL - 71 63 66 -  Calc LDL 0 - 99 mg/dL - 25 43 41 -  Triglycerides 0 - 149 mg/dL - 78 85 88 -  Creatinine 0.57 - 1.00 mg/dL - 0.82 0.82 0.83 -   BP/Weight 03/26/2021 12/31/2020 12/05/2020 08/26/2020 07/30/2020 07/01/2020 0000000  Systolic BP [id number] - 123XX123 123XX123 [id number] 0000000 123XX123  Diastolic BP 75 - 82 91 89 86 80  Wt. (Lbs) 226.12  225 227 221 226 226.04 231.6  BMI 38.21 38.02 37.77 37.35 37.61 38.2 38.54  Some encounter information is confidential and restricted. Go to Review Flowsheets activity to see all data.   Foot/eye exam completion dates Latest Ref Rng & Units 03/26/2021 05/07/2020  Eye Exam No Retinopathy - -  Foot Form Completion - Done Done        Grief Appropriate grief because of recent loss of her Dad, I encouraged her to reach out to Therapist as well as to plan on tsking approc 4 weeks leave  GERD Controlled, no change in medication   Morbid obesity ([place])  Patient re-educated about  the importance of commitment to a  minimum of 150 minutes of exercise per week as able.  The importance of healthy food choices with portion control discussed, as well as eating regularly and within a 12 hour window most days. The need to choose "clean , green" food 50 to 75% of the time is discussed, as well as to make water the primary drink and set a goal of 64 ounces water daily.    Weight /BMI 03/26/2021 12/31/2020 12/05/2020  WEIGHT 226 lb 1.9 oz 225 lb 227 lb  HEIGHT 5' 4.5" 5' 4.5" '5\' 5"'$   BMI 38.21 kg/m2 38.02 kg/m2 37.77 kg/m2  Some encounter information is confidential and restricted. Go to Review Flowsheets activity to see all data.

## 2021-03-27 NOTE — Assessment & Plan Note (Signed)
Controlled, no change in medication DASH diet and commitment to daily physical activity for a minimum of 30 minutes discussed and encouraged, as a part of hypertension management. The importance of attaining a healthy weight is also discussed.  BP/Weight 03/26/2021 12/31/2020 12/05/2020 08/26/2020 07/30/2020 07/01/2020 0000000  Systolic BP [id number] - 123XX123 123XX123 [id number] 0000000 123XX123  Diastolic BP 75 - 82 91 89 86 80  Wt. (Lbs) 226.12 225 227 221 226 226.04 231.6  BMI 38.21 38.02 37.77 37.35 37.61 38.2 38.54  Some encounter information is confidential and restricted. Go to Review Flowsheets activity to see all data.

## 2021-03-27 NOTE — Assessment & Plan Note (Signed)
Appropriate grief because of recent loss of her Dad, I encouraged her to reach out to Therapist as well as to plan on tsking approc 4 weeks leave

## 2021-03-27 NOTE — Assessment & Plan Note (Signed)
Deteriorated Managed by Endo at [facility], needs to be more diligent with diet and exercise Christina [name] is reminded of the importance of commitment to daily physical activity for 30 minutes or more, as able and the need to limit carbohydrate intake to 30 to 60 grams per meal to help with blood sugar control.   The need to take medication as prescribed, test blood sugar as directed, and to call between visits if there is a concern that blood sugar is uncontrolled is also discussed.   Christina [name] is reminded of the importance of daily foot exam, annual eye examination, and good blood sugar, blood pressure and cholesterol control.  Diabetic Labs Latest Ref Rng & Units 03/26/2021 12/11/2020 09/18/2020 01/12/2020 12/22/2019  HbA1c 0.0 - 7.0 % 7.8(A) - - - 9.4  Microalbumin Not Estab. ug/mL - - - - -  Micro/Creat Ratio 0 - 29 mg/g creat - - - - -  Chol 100 - 199 mg/dL - 112 122 124 -  HDL >39 mg/dL - 71 63 66 -  Calc LDL 0 - 99 mg/dL - 25 43 41 -  Triglycerides 0 - 149 mg/dL - 78 85 88 -  Creatinine 0.57 - 1.00 mg/dL - 0.82 0.82 0.83 -   BP/Weight 03/26/2021 12/31/2020 12/05/2020 08/26/2020 07/30/2020 07/01/2020 0000000  Systolic BP AB-123456789 - 123XX123 123XX123 A999333 0000000 123XX123  Diastolic BP 75 - 82 91 89 86 80  Wt. (Lbs) 226.12 225 227 221 226 226.04 231.6  BMI 38.21 38.02 37.77 37.35 37.61 38.2 38.54  Some encounter information is confidential and restricted. Go to Review Flowsheets activity to see all data.   Foot/eye exam completion dates Latest Ref Rng & Units 03/26/2021 05/07/2020  Eye Exam No Retinopathy - -  Foot Form Completion - Done Done

## 2021-03-27 NOTE — Assessment & Plan Note (Signed)
  Patient re-educated about  the importance of commitment to a  minimum of 150 minutes of exercise per week as able.  The importance of healthy food choices with portion control discussed, as well as eating regularly and within a 12 hour window most days. The need to choose "clean , green" food 50 to 75% of the time is discussed, as well as to make water the primary drink and set a goal of 64 ounces water daily.    Weight /BMI 03/26/2021 12/31/2020 12/05/2020  WEIGHT 226 lb 1.9 oz 225 lb 227 lb  HEIGHT 5' 4.5" 5' 4.5" '5\' 5"'$   BMI 38.21 kg/m2 38.02 kg/m2 37.77 kg/m2  Some encounter information is confidential and restricted. Go to Review Flowsheets activity to see all data.

## 2021-03-27 NOTE — Assessment & Plan Note (Signed)
Managed by Psych and stable 

## 2021-04-01 ENCOUNTER — Ambulatory Visit (INDEPENDENT_AMBULATORY_CARE_PROVIDER_SITE_OTHER): Payer: BC Managed Care – PPO | Admitting: Psychology

## 2021-04-01 DIAGNOSIS — F319 Bipolar disorder, unspecified: Secondary | ICD-10-CM | POA: Diagnosis not present

## 2021-04-03 DIAGNOSIS — E119 Type 2 diabetes mellitus without complications: Secondary | ICD-10-CM | POA: Diagnosis not present

## 2021-04-08 ENCOUNTER — Encounter: Payer: Self-pay | Admitting: Internal Medicine

## 2021-04-08 ENCOUNTER — Other Ambulatory Visit: Payer: Self-pay

## 2021-04-08 ENCOUNTER — Ambulatory Visit: Payer: BC Managed Care – PPO | Admitting: Internal Medicine

## 2021-04-08 DIAGNOSIS — J069 Acute upper respiratory infection, unspecified: Secondary | ICD-10-CM | POA: Diagnosis not present

## 2021-04-08 MED ORDER — NOREL AD 4-10-325 MG PO TABS: 1.0000 | ORAL_TABLET | Freq: Three times a day (TID) | ORAL | 0 refills | Status: DC | PRN

## 2021-04-08 NOTE — Progress Notes (Signed)
Virtual Visit via Telephone Note   This visit type was conducted due to national recommendations for restrictions regarding the COVID-19 Pandemic (e.g. social distancing) in an effort to limit this patient's exposure and mitigate transmission in our community.  Due to her co-morbid illnesses, this patient is at least at moderate risk for complications without adequate follow up.  This format is felt to be most appropriate for this patient at this time.  The patient did not have access to video technology/had technical difficulties with video requiring transitioning to audio format only (telephone).  All issues noted in this document were discussed and addressed.  No physical exam could be performed with this format.  Please refer to the patient's chart for her  consent to telehealth for [facility].   Evaluation Performed:  Follow-up visit  Date:  04/08/2021   ID:  Christina [name], DOB [birth year], MRN [id number]  Patient Location: Home Provider Location: Office/Clinic  Location of Patient: Home Location of Provider: Telehealth Consent was obtain for visit to be over via telehealth. I verified that I am speaking with the correct person using two identifiers.  PCP:  [name], MD   Chief Complaint:  Fever, nasal congestion, sore throat and fatigue  History of Present Illness:    Christina [name] is a 45 y.o. female who has a televisit for complaint of fever and nasal congestion along with sore throat for last 2 to 3 days.  She had a negative COVID test at home.  Denies any sick contacts.  She had COVID infection in 12/2020.  She is up-to-date with COVID-vaccine.  She denies any dyspnea or wheezing currently.  Denies any nausea, vomiting, or diarrhea.  The patient does have symptoms concerning for COVID-19 infection (fever, chills, cough, or new shortness of breath).   Past Medical, Surgical, Social History, Allergies, and Medications have been Reviewed.  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 Oneida Alar: Torturous transverse and sigmoid colon, internal hemorrhoids.  Next colonoscopy in 10 years with MAC and color wrap   dermoid tumor  [place] N/A 12/10/2016   Procedure: DILATATION & CURETTAGE/HYSTEROSCOPY WITH NOVASURE ABLATION;  Surgeon: Ena Dawley, MD;  Location[facility];  Service: Gynecology;  Laterality: N/A;   ESOPHAGOGASTRODUODENOSCOPY  07/01/2009   JKD:TOIZTI esphagus without barrett's/dilation with 16 mm/mild erthyema in the antrum. mild chronic gastritis on path.    ESOPHAGOGASTRODUODENOSCOPY (EGD) WITH PROPOFOL N/A 10/26/2017   Dr. Oneida Alar: Gastritis, gastric polyps.  Biopsies benign.  Small bowel biopsies negative for celiac.  No H. pylori.   HERNIA REPAIR  12/2018  REDUCTION MAMMAPLASTY     1994   TRIGGER FINGER RELEASE Bilateral 06/2015   TUBAL LIGATION Bilateral 03/06/2016   Procedure: BILATERAL TUBAL LIGATION;  Surgeon: Ena Dawley, MD;  Location: [place];  Service: Obstetrics;  Laterality: Bilateral;     Current Meds  Medication Sig   acetaminophen (TYLENOL) 500 MG tablet Take 1,000 mg every 8 (eight) hours as needed by mouth for mild  pain.    ARIPiprazole (ABILIFY) 5 MG tablet Take 1 tablet (5 mg total) by mouth every evening.   azelastine (ASTELIN) 0.1 % nasal spray Place 2 sprays into both nostrils 2 (two) times daily. Use in each nostril as directed   B-D UF III MINI PEN NEEDLES 31G X 5 MM MISC USE AS DIRECTED   Chlorphen-PE-Acetaminophen (NOREL AD) 4-10-325 MG TABS Take 1 tablet by mouth 3 (three) times daily as needed (Nasal congestion).   fluticasone (FLONASE) 50 MCG/ACT nasal spray Place 2 sprays into both nostrils daily.   glucose blood test strip Use as instructed, three times daily   levocetirizine (XYZAL) 5 MG tablet Take 5 mg by mouth every evening.   LORazepam (ATIVAN) 0.5 MG tablet Take 1/2 to one tab as needed for severe anxiety   losartan (COZAAR) 50 MG tablet Take 1 tablet (50 mg total) by mouth daily.   Magnesium 200 MG TABS Take 1 tablet by mouth daily.   metFORMIN (GLUCOPHAGE) 1000 MG tablet TAKE 1 TABLET BY MOUTH TWICE DAILY WITH MEALS   montelukast (SINGULAIR) 10 MG tablet Take 1 tablet (10 mg total) by mouth at bedtime.   omeprazole (PRILOSEC) 40 MG capsule TAKE 1 CAPSULE BY MOUTH TWICE DAILY BEFORE A MEAL   ONETOUCH DELICA LANCETS 54Y MISC Three times daily testing dx e11.9   OZEMPIC, 1 MG/DOSE, 2 MG/1.5ML SOPN Inject 0.5 mg into the skin once a week.   rosuvastatin (CRESTOR) 5 MG tablet Take one tablet by mouth three times weekly   sodium chloride (OCEAN) 0.65 % SOLN nasal spray Place 1 spray into both nostrils 4 (four) times daily as needed for congestion.   TRESIBA FLEXTOUCH 200 UNIT/ML FlexTouch Pen Inject 30 Units into the skin at bedtime.   Vitamin D, Ergocalciferol, 50000 units CAPS Take 1 capsule by mouth once a week.     Allergies:   Other, Peanut oil, Peanut-containing drug products, and Augmentin [amoxicillin-pot clavulanate]   ROS:   Please see the history of present illness.     All other systems reviewed and are negative.   Labs/Other Tests and Data Reviewed:    Recent  Labs: 12/11/2020: ALT 15; BUN 8; Creatinine, Ser 0.82; Hemoglobin 12.7; Magnesium 1.9; Platelets 337; Potassium 4.7; Sodium 138   Recent Lipid Panel Lab Results  Component Value Date/Time   CHOL 112 12/11/2020 08:37 AM   TRIG 78 12/11/2020 08:37 AM   HDL 71 12/11/2020 08:37 AM   CHOLHDL 1.6 12/11/2020 08:37 AM   CHOLHDL 1.7 03/16/2016 11:15 AM   LDLCALC 25 12/11/2020 08:37 AM    Wt Readings from Last 3 Encounters:  03/26/21 226 lb 1.9 oz (102.6 kg)  12/31/20 225 lb (102.1 kg)  12/05/20 227 lb (103 kg)     ASSESSMENT & PLAN:    URTI Symptomatic treatment for now Norel for nasal congestion Nasal saline spray as needed Flonase for allergic symptoms Less likely to be COVID infection as she had COVID infection recently and had a COVID test at home  Time:   Today, I have spent 9 minutes reviewing  the chart, including problem list, medications, and with the patient with telehealth technology discussing the above problems.   Medication Adjustments/Labs and Tests Ordered: Current medicines are reviewed at length with the patient today.  Concerns regarding medicines are outlined above.   Tests Ordered: No orders of the defined types were placed in this encounter.   Medication Changes: Meds ordered this encounter  Medications   Chlorphen-PE-Acetaminophen (NOREL AD) 4-10-325 MG TABS    Sig: Take 1 tablet by mouth 3 (three) times daily as needed (Nasal congestion).    Dispense:  30 tablet    Refill:  0     Note: This dictation was prepared with Dragon dictation along with smaller phrase technology. Similar sounding words can be transcribed inadequately or may not be corrected upon review. Any transcriptional errors that result from this process are unintentional.      Disposition:  Follow up  Signed, Lindell Spar, MD  04/08/2021 4:00 PM     [place]

## 2021-04-14 ENCOUNTER — Telehealth (HOSPITAL_COMMUNITY): Payer: Self-pay | Admitting: *Deleted

## 2021-04-14 NOTE — Telephone Encounter (Signed)
She can try seeing Leeann more frequently to address loss of her grandmother.  I will defer increasing Abilify as it may cause worsening of blood sugar.  If she agree we can try low-dose BuSpar 5 mg twice a day.  Please call her pharmacy if she want to give a try BuSpar.

## 2021-04-14 NOTE — Telephone Encounter (Signed)
Pt is asking if you would consider "adjusting" her meds, or adding an adjunctive to her regime. Pt says that she continues to feel depressed and most specifically still grieving the loss of her Grandmother. Pt has an appointment scheduled for 05/29/21.

## 2021-04-15 ENCOUNTER — Other Ambulatory Visit (HOSPITAL_COMMUNITY): Payer: Self-pay | Admitting: *Deleted

## 2021-04-15 ENCOUNTER — Ambulatory Visit (INDEPENDENT_AMBULATORY_CARE_PROVIDER_SITE_OTHER): Payer: BC Managed Care – PPO | Admitting: Psychology

## 2021-04-15 ENCOUNTER — Telehealth (HOSPITAL_COMMUNITY): Payer: Self-pay | Admitting: *Deleted

## 2021-04-15 DIAGNOSIS — F319 Bipolar disorder, unspecified: Secondary | ICD-10-CM | POA: Diagnosis not present

## 2021-04-15 MED ORDER — BUSPIRONE HCL 15 MG PO TABS: 15.0000 mg | ORAL_TABLET | Freq: Two times a day (BID) | ORAL | 0 refills | Status: DC

## 2021-04-15 NOTE — Telephone Encounter (Signed)
Writer left VM for pt regarding her request to increase Abilify. Writer advised pt that Dr.Arfeen has concerns about increasing Abilify due to possible blood sugar increase. Pt offered Buspar 5 mg bid. Pt encouraged to return call and let nurse know if she would like to try the [place]. FYI.

## 2021-04-18 ENCOUNTER — Other Ambulatory Visit: Payer: Self-pay

## 2021-04-18 ENCOUNTER — Ambulatory Visit
Admission: RE | Admit: 2021-04-18 | Discharge: 2021-04-18 | Disposition: A | Discharge status: Home / Self Care | Payer: BC Managed Care – PPO | Source: Ambulatory Visit | Attending: Family Medicine | Admitting: Family Medicine

## 2021-04-18 DIAGNOSIS — Z1231 Encounter for screening mammogram for malignant neoplasm of breast: Secondary | ICD-10-CM | POA: Diagnosis not present

## 2021-04-22 ENCOUNTER — Ambulatory Visit (INDEPENDENT_AMBULATORY_CARE_PROVIDER_SITE_OTHER): Payer: BC Managed Care – PPO | Admitting: Psychology

## 2021-04-22 DIAGNOSIS — F319 Bipolar disorder, unspecified: Secondary | ICD-10-CM

## 2021-04-28 ENCOUNTER — Other Ambulatory Visit (HOSPITAL_COMMUNITY): Payer: Self-pay | Admitting: Psychiatry

## 2021-04-28 ENCOUNTER — Ambulatory Visit (INDEPENDENT_AMBULATORY_CARE_PROVIDER_SITE_OTHER): Payer: BC Managed Care – PPO | Admitting: Psychology

## 2021-04-28 DIAGNOSIS — F319 Bipolar disorder, unspecified: Secondary | ICD-10-CM | POA: Diagnosis not present

## 2021-04-29 ENCOUNTER — Ambulatory Visit: Payer: BC Managed Care – PPO | Admitting: Psychology

## 2021-05-01 DIAGNOSIS — J301 Allergic rhinitis due to pollen: Secondary | ICD-10-CM | POA: Diagnosis not present

## 2021-05-01 DIAGNOSIS — J3089 Other allergic rhinitis: Secondary | ICD-10-CM | POA: Diagnosis not present

## 2021-05-01 DIAGNOSIS — T781XXD Other adverse food reactions, not elsewhere classified, subsequent encounter: Secondary | ICD-10-CM | POA: Diagnosis not present

## 2021-05-03 DIAGNOSIS — Z7189 Other specified counseling: Secondary | ICD-10-CM | POA: Diagnosis not present

## 2021-05-05 ENCOUNTER — Telehealth (HOSPITAL_COMMUNITY): Payer: Self-pay

## 2021-05-06 ENCOUNTER — Ambulatory Visit (INDEPENDENT_AMBULATORY_CARE_PROVIDER_SITE_OTHER): Payer: BC Managed Care – PPO | Admitting: Psychology

## 2021-05-06 DIAGNOSIS — F319 Bipolar disorder, unspecified: Secondary | ICD-10-CM

## 2021-05-13 ENCOUNTER — Ambulatory Visit (INDEPENDENT_AMBULATORY_CARE_PROVIDER_SITE_OTHER): Payer: BC Managed Care – PPO | Admitting: Psychology

## 2021-05-13 DIAGNOSIS — F319 Bipolar disorder, unspecified: Secondary | ICD-10-CM | POA: Diagnosis not present

## 2021-05-13 NOTE — Telephone Encounter (Signed)
error 

## 2021-05-15 DIAGNOSIS — J3089 Other allergic rhinitis: Secondary | ICD-10-CM | POA: Diagnosis not present

## 2021-05-15 DIAGNOSIS — J301 Allergic rhinitis due to pollen: Secondary | ICD-10-CM | POA: Diagnosis not present

## 2021-05-16 DIAGNOSIS — J3089 Other allergic rhinitis: Secondary | ICD-10-CM | POA: Diagnosis not present

## 2021-05-16 DIAGNOSIS — J301 Allergic rhinitis due to pollen: Secondary | ICD-10-CM | POA: Diagnosis not present

## 2021-05-20 ENCOUNTER — Ambulatory Visit: Payer: BC Managed Care – PPO | Admitting: Psychology

## 2021-05-20 ENCOUNTER — Telehealth: Payer: Self-pay | Admitting: Internal Medicine

## 2021-05-20 NOTE — Telephone Encounter (Signed)
Returned the pt's call and was advised by her that the past 24 hrs., she started with vomiting then from there added watery diarrhea, fever, nausea and abdominal cramping above her navel. The pt stated she had the exact same thing week before last and thought it was a virus. It last about 24 hrs. She stated no one else in the house got sick and for it to be back she was wondering what would you advise for her to take or to do. Please advise.

## 2021-05-20 NOTE — Telephone Encounter (Signed)
Pt is aware of her OV in March and that she is on a cancellation list. She would like to talk with the nurse. [phone]

## 2021-05-21 ENCOUNTER — Other Ambulatory Visit: Payer: Self-pay

## 2021-05-21 DIAGNOSIS — R197 Diarrhea, unspecified: Secondary | ICD-10-CM

## 2021-05-21 DIAGNOSIS — R112 Nausea with vomiting, unspecified: Secondary | ICD-10-CM

## 2021-05-21 MED ORDER — ONDANSETRON HCL 4 MG PO TABS: 4.0000 mg | ORAL_TABLET | Freq: Three times a day (TID) | ORAL | 0 refills | Status: AC | PRN

## 2021-05-21 NOTE — Telephone Encounter (Signed)
Likely this is viral gastroenteritis.  She needs to make sure that she is staying well-hydrated and getting her fluids in.  If her symptoms last longer than a few days then she will need to go to urgent care or ER for evaluation.  If she would like nausea medication, okay to send in ondansetron 4 mg every 8 hours as needed 30 tablets, no refills.  Thank you

## 2021-05-21 NOTE — Telephone Encounter (Signed)
Phoned and advised the pt of the above (she is still having symptoms). Advised of the recommendations of UC/ER evaluation. Rx sent in to [facility] in [place]. Pt is aware of this

## 2021-05-27 ENCOUNTER — Ambulatory Visit (INDEPENDENT_AMBULATORY_CARE_PROVIDER_SITE_OTHER): Payer: BC Managed Care – PPO | Admitting: Psychology

## 2021-05-27 DIAGNOSIS — F319 Bipolar disorder, unspecified: Secondary | ICD-10-CM | POA: Diagnosis not present

## 2021-05-29 ENCOUNTER — Telehealth (HOSPITAL_BASED_OUTPATIENT_CLINIC_OR_DEPARTMENT_OTHER): Payer: BC Managed Care – PPO | Admitting: Psychiatry

## 2021-05-29 ENCOUNTER — Other Ambulatory Visit: Payer: Self-pay

## 2021-05-29 ENCOUNTER — Encounter (HOSPITAL_COMMUNITY): Payer: Self-pay | Admitting: Psychiatry

## 2021-05-29 VITALS — Wt 215.0 lb

## 2021-05-29 DIAGNOSIS — F419 Anxiety disorder, unspecified: Secondary | ICD-10-CM

## 2021-05-29 DIAGNOSIS — F4321 Adjustment disorder with depressed mood: Secondary | ICD-10-CM

## 2021-05-29 DIAGNOSIS — F3162 Bipolar disorder, current episode mixed, moderate: Secondary | ICD-10-CM | POA: Diagnosis not present

## 2021-05-29 MED ORDER — LORAZEPAM 0.5 MG PO TABS: ORAL_TABLET | ORAL | 0 refills | Status: DC

## 2021-05-29 MED ORDER — ARIPIPRAZOLE 5 MG PO TABS: 5.0000 mg | ORAL_TABLET | Freq: Every evening | ORAL | 0 refills | Status: DC

## 2021-05-29 NOTE — Progress Notes (Signed)
Virtual Visit via Telephone Note  I connected with Christina [name] on 05/29/21 at  3:20 PM EDT by telephone and verified that I am speaking with the correct person using two identifiers.  Location: Patient: Work Provider: [facility]ionic[facility] Stall   I discussed the limitations, risks, security and privacy concerns of performing an evaluation and management service by telephone and the availability of in person appointments. I also discussed with the patient that there may be a patient responsible charge related to this service. The patient expressed understanding and agreed to proceed.   History of Present Illness: Patient is evaluated by phone session.  She admitted being very sad and dysphoric going through the grief.  Her father passed away in April 05, 2023 due to cardiogenic shock.  Patient told father was on transplant list for kidney but died due to heart issues.  She admitted being very isolated withdrawn and having crying spells.  Sometimes she does not sleep as good.  She feels nervous and anxious.  She worried about her mother who is also very sad.  She had put a hold on her masters because she is overwhelmed with things.  There are nights she does not sleep very well.  She is seeing Leeann twice a month but she feels that she need to talk more about grief.  Recently she had a blood work and her hemoglobin A1c is slightly increased from the past.  Patient told her work is challenging because she took a few weeks of after her dad passed away and now she has to work extra hours and on the weekends.  There are days when she stays in the bed and has no energy or motivation to do things.  We have tried BuSpar but she feels more tired and stopped taking it.  She denies any mania, psychosis, hallucination.  She denies any highs and lows or any suicidal thoughts.  She regret holding her masters but she has no other choice.  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  Her  husband is very supportive.  Patient told her son finally diagnosed with autism and now she is working with the therapist to work on treatment plan.  She admitted weight loss because she was going through grief but denies any binge eating or purging.    Past Psychiatric History:  H/O depression, anxiety, mania and psychosis.  Admitted in 1997 at [facility] due to suicidal gestures.  Seen in this office since 2006. Tried Prozac and Wellbutrin.    Recent Results (from the past 2160 hour(s))  POCT glycosylated hemoglobin (Hb A1C)     Status: Abnormal   Collection Time: 03/26/21 10:30 AM  Result Value Ref Range   Hemoglobin A1C     HbA1c POC (<> result, manual entry)     HbA1c, POC (prediabetic range)     HbA1c, POC (controlled diabetic range) 7.8 (A) 0.0 - 7.0 %     Psychiatric Specialty Exam: Physical Exam  Review of Systems  Weight 215 lb (97.5 kg), unknown if currently breastfeeding.There is no height or weight on file to calculate BMI.  General Appearance: NA  Eye Contact:  NA  Speech:  Slow  Volume:  Decreased  Mood:  Dysphoric  Affect:  NA  Thought Process:  Goal Directed  Orientation:  Full (Time, Place, and Person)  Thought Content:  Rumination  Suicidal Thoughts:  No  Homicidal Thoughts:  No  Memory:  Immediate;  Good Recent;   Good Remote;   Good  Judgement:  Intact  Insight:  Good  Psychomotor Activity:  NA  Concentration:  Concentration: Good and Attention Span: Good  Recall:  Good  Fund of Knowledge:  Good  Language:  Good  Akathisia:  No  Handed:  Right  AIMS (if indicated):     Assets:  Communication Skills [facility] [place] Talents/Skills Transportation  ADL's:  Intact  Cognition:  WNL  Sleep:   Fair      Assessment and Plan: Bipolar disorder type I.  Anxiety.  Grief.  I reviewed blood work results.  Her hemoglobin A1c is slightly increased from the past.  She lost weight because sometimes she does not have an  appetite.  Discussed going through grief and strongly recommend to consider grief counseling through hospice.  Patient agreed with the plan.  I offered low-dose BuSpar since higher dose make her tired but patient feels it is the grief process and if symptoms continue to persist after therapy then she will call us back.  She does 1 Ativan because holidays are coming and she feels she needed when she will see the family members.  We have provided Ativan 5 tablets last year but there is no refill on it.  We will provide a new prescription of Ativan 0.5 mg to take half to 1 tablet as needed number 7 tablets.  I will continue Abilify 5 mg daily.  Encouraged to keep appointment with the PCP.  Discontinue BuSpar as patient did not continue.  Encouraged to continue therapy with Archie Endo.  Recommended to call us back if she is any question or any concern.  Follow-up in 10 months.  Follow Up Instructions:    I discussed the assessment and treatment plan with the patient. The patient was provided an opportunity to ask questions and all were answered. The patient agreed with the plan and demonstrated an understanding of the instructions.   The patient was advised to call back or seek an in-person evaluation if the symptoms worsen or if the condition fails to improve as anticipated.  I provided 28 minutes of non-face-to-face time during this encounter.   Kathlee Nations, MD

## 2021-06-03 DIAGNOSIS — E119 Type 2 diabetes mellitus without complications: Secondary | ICD-10-CM | POA: Diagnosis not present

## 2021-06-04 ENCOUNTER — Ambulatory Visit (INDEPENDENT_AMBULATORY_CARE_PROVIDER_SITE_OTHER): Payer: BC Managed Care – PPO | Admitting: Psychology

## 2021-06-04 DIAGNOSIS — F319 Bipolar disorder, unspecified: Secondary | ICD-10-CM

## 2021-06-18 ENCOUNTER — Ambulatory Visit: Payer: BC Managed Care – PPO | Admitting: Psychology

## 2021-06-23 ENCOUNTER — Ambulatory Visit (INDEPENDENT_AMBULATORY_CARE_PROVIDER_SITE_OTHER): Payer: BC Managed Care – PPO | Admitting: Psychology

## 2021-06-23 DIAGNOSIS — F319 Bipolar disorder, unspecified: Secondary | ICD-10-CM

## 2021-06-24 ENCOUNTER — Other Ambulatory Visit: Payer: Self-pay | Admitting: Family Medicine

## 2021-06-24 ENCOUNTER — Ambulatory Visit: Payer: BC Managed Care – PPO | Admitting: Psychology

## 2021-06-30 ENCOUNTER — Ambulatory Visit (INDEPENDENT_AMBULATORY_CARE_PROVIDER_SITE_OTHER): Payer: BC Managed Care – PPO | Admitting: Psychology

## 2021-06-30 DIAGNOSIS — F319 Bipolar disorder, unspecified: Secondary | ICD-10-CM

## 2021-07-03 DIAGNOSIS — E119 Type 2 diabetes mellitus without complications: Secondary | ICD-10-CM | POA: Diagnosis not present

## 2021-07-04 DIAGNOSIS — D3501 Benign neoplasm of right adrenal gland: Secondary | ICD-10-CM | POA: Diagnosis not present

## 2021-07-04 DIAGNOSIS — Z794 Long term (current) use of insulin: Secondary | ICD-10-CM | POA: Diagnosis not present

## 2021-07-04 DIAGNOSIS — Z6836 Body mass index (BMI) 36.0-36.9, adult: Secondary | ICD-10-CM | POA: Diagnosis not present

## 2021-07-04 DIAGNOSIS — E1165 Type 2 diabetes mellitus with hyperglycemia: Secondary | ICD-10-CM | POA: Diagnosis not present

## 2021-07-04 DIAGNOSIS — E785 Hyperlipidemia, unspecified: Secondary | ICD-10-CM | POA: Diagnosis not present

## 2021-07-04 DIAGNOSIS — I10 Essential (primary) hypertension: Secondary | ICD-10-CM | POA: Diagnosis not present

## 2021-07-08 ENCOUNTER — Other Ambulatory Visit: Payer: Self-pay

## 2021-07-08 ENCOUNTER — Telehealth: Payer: Self-pay | Admitting: Family Medicine

## 2021-07-08 NOTE — Telephone Encounter (Signed)
Summer with [employer] called in on pt behalf  Pt needs refill on   losartan (COZAAR) 50 MG tablet  Sent to [facility] , [employer]  [phone] Fax - [phone]

## 2021-07-09 ENCOUNTER — Ambulatory Visit (INDEPENDENT_AMBULATORY_CARE_PROVIDER_SITE_OTHER): Payer: BC Managed Care – PPO | Admitting: Psychology

## 2021-07-09 ENCOUNTER — Other Ambulatory Visit: Payer: Self-pay

## 2021-07-09 DIAGNOSIS — F319 Bipolar disorder, unspecified: Secondary | ICD-10-CM | POA: Diagnosis not present

## 2021-07-09 NOTE — Progress Notes (Signed)
[place] Counselor/Therapist Progress Note  Patient ID: Christina [name], MRN: [id number],    Date: 07/09/2021  Time Spent: 1:30pm-2:21pm   Treatment Type: Individual Therapy  Pt is seen for a virtual audio visit.  Internet access prevents video access.  Pt joins from her car outside of work and counselor from her home office.  Reported Symptoms:  Pt endorses stress, anxiety and grief.  Pt reported on job stressors and politics of her job.  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  Pt discussed options, pros and cons.  Pt identified priorities for work/home balance, being able to support her youngest's needs and completing her masters.    Mental Status Exam: Appearance:  NA     Behavior: Appropriate  Motor: N/a  Speech/Language:  Normal Rate  Affect: Appropriate  Mood: normal  Thought process: normal  Thought content:   WNL  Sensory/Perceptual disturbances:   WNL  Orientation: oriented to person, place, time/date, and situation  Attention: Good  Concentration: Good  Memory: WNL  Fund of knowledge:  Good  Insight:   Good  Judgment:  Good  Impulse Control: Good   Risk Assessment: Danger to Self:  No Self-injurious Behavior: No Danger to Others: No Duty to Warn:no Physical Aggression / Violence:No   Subjective: Counselor assessed pt current functioning per pt report.  Processed w/pt stressors and decisions considering re: job, school, family balance.   Assisted pt in exploring her values and what is consistent w/ her values.  Encouraged pt to continue to explore her options and opportunities.  Interventions: Cognitive Behavioral Therapy, Humanistic/Existential, Insight-Oriented, and supportive  Diagnosis:Bipolar I disorder ([place])  Plan: Pt to f/u w/ cousneling in 1 week to assist coping w/ grief and mood stability.  See tx plan on file in therapy charts. Pt to f/u w/ [name] as scheduled.    [name], [facility]

## 2021-07-10 ENCOUNTER — Other Ambulatory Visit: Payer: Self-pay | Admitting: Nurse Practitioner

## 2021-07-10 ENCOUNTER — Telehealth: Payer: Self-pay

## 2021-07-10 ENCOUNTER — Other Ambulatory Visit: Payer: Self-pay

## 2021-07-10 MED ORDER — LOSARTAN POTASSIUM 50 MG PO TABS: 50.0000 mg | ORAL_TABLET | Freq: Every day | ORAL | 1 refills | Status: DC

## 2021-07-10 NOTE — Telephone Encounter (Signed)
Can you re-fax the RX to [facility] - they said they did not get it.

## 2021-07-14 ENCOUNTER — Other Ambulatory Visit: Payer: Self-pay

## 2021-07-14 MED ORDER — ROSUVASTATIN CALCIUM 5 MG PO TABS: ORAL_TABLET | ORAL | 3 refills | Status: DC

## 2021-07-14 NOTE — Telephone Encounter (Signed)
Katie from [facility] called need refill on rosuvastatin 5 mg

## 2021-07-14 NOTE — Telephone Encounter (Signed)
Sent refills to [facility]

## 2021-07-16 ENCOUNTER — Ambulatory Visit: Payer: BC Managed Care – PPO | Admitting: Psychology

## 2021-07-23 ENCOUNTER — Ambulatory Visit: Payer: BC Managed Care – PPO | Admitting: Psychology

## 2021-07-29 ENCOUNTER — Ambulatory Visit (INDEPENDENT_AMBULATORY_CARE_PROVIDER_SITE_OTHER): Payer: BC Managed Care – PPO | Admitting: Psychology

## 2021-07-29 DIAGNOSIS — F319 Bipolar disorder, unspecified: Secondary | ICD-10-CM

## 2021-07-29 NOTE — Progress Notes (Signed)
[facility] Counselor/Therapist Progress Note  Patient ID: Christina [name], MRN: [id number],    Date: 07/29/2021  Time Spent: 111:00am-11:50am   Treatment Type: Individual Therapy  Pt is seen for a virtual audio visit.  Internet access prevents video access.  Pt joins from her car outside of home and counselor from her home office.  Reported Symptoms:  Pt endorses stress and grief.   Mental Status Exam: Appearance:  NA     Behavior: Appropriate  Motor: N/a  Speech/Language:  Normal Rate  Affect: Appropriate  Mood: normal  Thought process: normal  Thought content:   WNL  Sensory/Perceptual disturbances:   WNL  Orientation: oriented to person, place, time/date, and situation  Attention: Good  Concentration: Good  Memory: WNL  Fund of knowledge:  Good  Insight:   Good  Judgment:  Good  Impulse Control: Good   Risk Assessment: Danger to Self:  No Self-injurious Behavior: No Danger to Others: No Duty to Warn:no Physical Aggression / Violence:No   Subjective: Counselor assessed pt current functioning per pt report.  Processed w/pt holiday, interactions w/ family and grief.  Discussed how expressing emotions and normalized grief reactions.  Discussed upcoming dates related to dad.  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  Pt affect wnl.  Pt reported positive interactions over holidays. Pt reported on grief reaction this morning.  Pt discussed how expressing emotions.  Pt reported on dates upcoming that anticipates may be difficult.  Pt reported on her plan A and B for job and completing masters w/ internship and feels more settled about this.    Interventions: Cognitive Behavioral Therapy, Humanistic/Existential, Insight-Oriented, and supportive  Diagnosis:Bipolar I disorder ([place])  Plan: Pt to f/u w/ cousneling in 1 week to assist coping w/ grief and mood stability.  Pt to f/u w/ [name] as scheduled.   Treatment Plan Client Abilities/Strengths   supports: family- partner and parents, friends positives: making a plan and sticking to it i had the confidence to pursue promotion for first time.  Client Treatment Preferences  biweekly counseling and continue medication management [name].  Client Statement of Needs  pt "continue strategies shared and use when recognize when going into my highs or lows or in between". Continue same- " i just need to maintain." 02/08/2021 grief counseling with death of her father.  Treatment Level  outpatient counseling  Symptoms  death of father 04-05-21. History of at least one hypomanic, manic, or mixed mood episode.Marland Kitchen History of chronic or recurrent depression for which the client has taken antidepressant medication,  had outpatient treatment, or had a course of electroconvulsive therapy.  Problems Addressed  Grief / Loss Unresolved, Bipolar Disorde, Bipolar Disorder  Goals 1. Begin a healthy grieving process around the loss. Objective Begin verbalizing feelings associated with the loss. Target Date: 2022-04-15 Frequency: Daily  Progress: 0 Modality: individual  Related Interventions Assist the client in identifying and expressing feelings connected with his/her loss. 2. continue to use calming skills to assist coping w/ stress and managing mood Objective Maintain positive self care, use of mindfulness and other grounding activities to maintain mood stability. Target Date: 02-08-2022 Frequency: Daily  Progress: 70 Modality: individual  Related Interventions Assist the client in establishing a routine pattern of daily activities such as sleeping, eating, solitary and social activities, and exercise; use of relaxation and grounding strategies routine and assist in assessing mood. 3. Develop healthy cognitive patterns and beliefs about self and the world that lead to alleviation and  help prevent the relapse of mood episodes. Objective Discuss and resolve troubling personal and interpersonal  issues. Target Date: 2022-01-21 Frequency: Daily  Progress: 30 Modality: individual  Related Interventions Use interpersonal therapy techniques to explore and resolve issues surrounding marital stress, role disputes, role transitions, and conflict skills/boundaries; provide support and strategies for resolving identified interpersonal issues. Objective Identify and replace thoughts and behaviors that trigger manic or depressive symptoms. Target Date: 2022-01-21 Frequency: Daily  Progress: 60 Modality: individual  Related Interventions uitilize CBT strategies to assist in challenging negative thought patterns and beliefs.  Client participated in Treatment plan development and provided verbal consent.   [name], [facility]

## 2021-08-01 DIAGNOSIS — D3501 Benign neoplasm of right adrenal gland: Secondary | ICD-10-CM | POA: Diagnosis not present

## 2021-08-03 DIAGNOSIS — E119 Type 2 diabetes mellitus without complications: Secondary | ICD-10-CM | POA: Diagnosis not present

## 2021-08-11 ENCOUNTER — Ambulatory Visit (INDEPENDENT_AMBULATORY_CARE_PROVIDER_SITE_OTHER): Payer: BC Managed Care – PPO | Admitting: Psychology

## 2021-08-11 DIAGNOSIS — F319 Bipolar disorder, unspecified: Secondary | ICD-10-CM

## 2021-08-11 DIAGNOSIS — F4321 Adjustment disorder with depressed mood: Secondary | ICD-10-CM

## 2021-08-11 NOTE — Progress Notes (Addendum)
[facility] Counselor/Therapist Progress Note  Patient ID: KEIRAH KONITZER, MRN: [id number],    Date: 08/11/2021  Time Spent: 1:30pm-2:20pm   Treatment Type: Individual Therapy  Pt is seen for a virtual audio visit.  Internet access prevents video access.  Pt joins from her car outside of work and counselor from her home office.  Reported Symptoms:  Pt endorses sadness and grief.   Mental Status Exam: Appearance:  NA     Behavior: Appropriate  Motor: N/a  Speech/Language:  Normal Rate  Affect: Appropriate  Mood: normal  Thought process: normal  Thought content:   WNL  Sensory/Perceptual disturbances:   WNL  Orientation: oriented to person, place, time/date, and situation  Attention: Good  Concentration: Good  Memory: WNL  Fund of knowledge:  Good  Insight:   Good  Judgment:  Good  Impulse Control: Good   Risk Assessment: Danger to Self:  No Self-injurious Behavior: No Danger to Others: No Duty to Warn:no Physical Aggression / Violence:No   Subjective: Counselor assessed pt current functioning per pt report.  Processed w/pt holidays, dad's upcoming birthday and grief.  Validated and normalized emotions and grief reactions.  Discussed dad's birthday and what plans for acknowledging, engaging w/ others and allowing to be flexible.  Explored job stressors.  Pt affect wnl.  Pt reports sadness, grieving w/ dad's birthday tomorrow.  Pt acknowledges how will be difficult and plans to engage w/ family. Pt discussed drive/route that tends to trigger thoughts of dad/tearfulness and unsure why connection.  Pt receptive to allowing for that expression of emotion.  Pt discussed some work stressors and how coping.  Pt discussed plans w/her son's therapy and committing to that and discussed some positives upcoming.  Interventions: Cognitive Behavioral Therapy, Humanistic/Existential, Insight-Oriented, and supportive  Diagnosis:No diagnosis found.  Plan: Pt to f/u w/ cousneling  in 1 week to assist coping w/ grief and mood stability. Plan for in person session.  Pt to f/u w/ [name] as scheduled.   Treatment Plan Client Abilities/Strengths  supports: family- partner and parents, friends positives: making a plan and sticking to it i had the confidence to pursue promotion for first time.  Client Treatment Preferences  biweekly counseling and continue medication management [name].  Client Statement of Needs  pt "continue strategies shared and use when recognize when going into my highs or lows or in between". Continue same- " i just need to maintain." 01-28-2021 grief counseling with death of her father.  Treatment Level  outpatient counseling  Symptoms  death of father 03-25-2021. History of at least one hypomanic, manic, or mixed mood episode.Marland Kitchen History of chronic or recurrent depression for which the client has taken antidepressant medication,  had outpatient treatment, or had a course of electroconvulsive therapy.  Problems Addressed  Grief / Loss Unresolved, Bipolar Disorde, Bipolar Disorder  Goals 1. Begin a healthy grieving process around the loss. Objective Begin verbalizing feelings associated with the loss. Target Date: 2022-04-15 Frequency: Daily  Progress: 0 Modality: individual  Related Interventions Assist the client in identifying and expressing feelings connected with his/her loss. 2. continue to use calming skills to assist coping w/ stress and managing mood Objective Maintain positive self care, use of mindfulness and other grounding activities to maintain mood stability. Target Date: January 28, 2022 Frequency: Daily  Progress: 70 Modality: individual  Related Interventions Assist the client in establishing a routine pattern of daily activities such as sleeping, eating, solitary and social activities, and exercise; use of relaxation and grounding  strategies routine and assist in assessing mood. 3. Develop healthy cognitive patterns and beliefs about  self and the world that lead to alleviation and help prevent the relapse of mood episodes. Objective Discuss and resolve troubling personal and interpersonal issues. Target Date: 2022-01-21 Frequency: Daily  Progress: 30 Modality: individual  Related Interventions Use interpersonal therapy techniques to explore and resolve issues surrounding marital stress, role disputes, role transitions, and conflict skills/boundaries; provide support and strategies for resolving identified interpersonal issues. Objective Identify and replace thoughts and behaviors that trigger manic or depressive symptoms. Target Date: 2022-01-21 Frequency: Daily  Progress: 60 Modality: individual  Related Interventions uitilize CBT strategies to assist in challenging negative thought patterns and beliefs.  Client participated in Treatment plan development and provided verbal consent.   [name], [facility]

## 2021-08-13 ENCOUNTER — Telehealth (HOSPITAL_COMMUNITY): Payer: BC Managed Care – PPO | Admitting: Psychiatry

## 2021-08-18 ENCOUNTER — Other Ambulatory Visit: Payer: Self-pay

## 2021-08-18 ENCOUNTER — Telehealth (HOSPITAL_BASED_OUTPATIENT_CLINIC_OR_DEPARTMENT_OTHER): Payer: BC Managed Care – PPO | Admitting: Psychiatry

## 2021-08-18 ENCOUNTER — Encounter (HOSPITAL_COMMUNITY): Payer: Self-pay | Admitting: Psychiatry

## 2021-08-18 DIAGNOSIS — F3162 Bipolar disorder, current episode mixed, moderate: Secondary | ICD-10-CM

## 2021-08-18 DIAGNOSIS — F419 Anxiety disorder, unspecified: Secondary | ICD-10-CM

## 2021-08-18 MED ORDER — ARIPIPRAZOLE 5 MG PO TABS: 5.0000 mg | ORAL_TABLET | Freq: Every evening | ORAL | 0 refills | Status: DC

## 2021-08-18 NOTE — Progress Notes (Signed)
Virtual Visit via Telephone Note  I connected with Christina [name] on 08/18/21 at 10:40 AM EST by telephone and verified that I am speaking with the correct person using two identifiers.  Location: Patient: Home Provider: Home office   I discussed the limitations, risks, security and privacy concerns of performing an evaluation and management service by telephone and the availability of in person appointments. I also discussed with the patient that there may be a patient responsible charge related to this service. The patient expressed understanding and agreed to proceed.   History of Present Illness: Patient is evaluated by phone session.  She endorsed Christmas was difficult as she was going through grief and remembering her father who passed away in March 25, 2023 due to cardiogenic shock.  We have recommended to contact hospice but she stayed with Secundino Ginger once a week and that helped her a lot.  She still have some time dysphoric mood but overall things are going well.  She had a supportive family.  She denies any mania, hallucination, anger.  She denies any impulsive behavior.  She sleeps good.  She is compliant with Abilify.  She has taken twice Ativan that help her anxiety from Christmas.  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  She had a plan to go back to do masters but she is not sure if it will happen given the fact that she been very busy at work.  Patient denies any crying spells.  She denies any panic attack.  She is trying to have a better control on her blood sugar.  Her last hemoglobin A1c dropped to 6.6 and she is happy about it.  She is now taking higher dose of Ozempic.  Her diabetes is managed at [facility].  Patient denies any tremors, shakes.  She like to keep the Abilify current dose.   Past Psychiatric History:  H/O depression, anxiety, mania and psychosis.  Admitted in 1997 at [facility] due to suicidal gestures.  Seen in this office since 2006. Tried  Prozac and Wellbutrin.     Psychiatric Specialty Exam: Physical Exam  Review of Systems  Weight 213 lb (96.6 kg), unknown if currently breastfeeding.There is no height or weight on file to calculate BMI.  General Appearance: NA  Eye Contact:  NA  Speech:  Clear and Coherent  Volume:  Normal  Mood:  Dysphoric  Affect:  NA  Thought Process:  Goal Directed  Orientation:  Full (Time, Place, and Person)  Thought Content:  Logical  Suicidal Thoughts:  No  Homicidal Thoughts:  No  Memory:  Immediate;   Good Recent;   Good Remote;   Good  Judgement:  Intact  Insight:  Good  Psychomotor Activity:  NA  Concentration:  Concentration: Good and Attention Span: Good  Recall:  Good  Fund of Knowledge:  Good  Language:  Good  Akathisia:  No  Handed:  Right  AIMS (if indicated):     Assets:  Communication Skills Desire for Improvement Housing Resilience Social Support Talents/Skills Transportation  ADL's:  Intact  Cognition:  WNL  Sleep:   ok      Assessment and Plan: Bipolar disorder type I.  Anxiety.  I reviewed blood work results.  Her hemoglobin A1c dropped to 6.6.  She does not need a new refill of Ativan as she still had a few pills left.  Continue Abilify 5 mg daily.  Encouraged to continue therapy with Archie Endo.  Recommended  to call us back if she has any question or any concern.  Follow-up in 3 months.  Follow Up Instructions:    I discussed the assessment and treatment plan with the patient. The patient was provided an opportunity to ask questions and all were answered. The patient agreed with the plan and demonstrated an understanding of the instructions.   The patient was advised to call back or seek an in-person evaluation if the symptoms worsen or if the condition fails to improve as anticipated.  I provided 16 minutes of non-face-to-face time during this encounter.   Kathlee Nations, MD

## 2021-08-19 DIAGNOSIS — E559 Vitamin D deficiency, unspecified: Secondary | ICD-10-CM | POA: Diagnosis not present

## 2021-08-19 DIAGNOSIS — E1165 Type 2 diabetes mellitus with hyperglycemia: Secondary | ICD-10-CM | POA: Diagnosis not present

## 2021-08-20 ENCOUNTER — Other Ambulatory Visit: Payer: Self-pay

## 2021-08-20 ENCOUNTER — Encounter: Payer: Self-pay | Admitting: Family Medicine

## 2021-08-20 ENCOUNTER — Ambulatory Visit: Payer: BC Managed Care – PPO | Admitting: Family Medicine

## 2021-08-20 VITALS — BP 110/78 | HR 95 | Ht 65.0 in | Wt 216.1 lb

## 2021-08-20 DIAGNOSIS — F3162 Bipolar disorder, current episode mixed, moderate: Secondary | ICD-10-CM | POA: Diagnosis not present

## 2021-08-20 DIAGNOSIS — Z794 Long term (current) use of insulin: Secondary | ICD-10-CM

## 2021-08-20 DIAGNOSIS — I10 Essential (primary) hypertension: Secondary | ICD-10-CM | POA: Diagnosis not present

## 2021-08-20 DIAGNOSIS — E1165 Type 2 diabetes mellitus with hyperglycemia: Secondary | ICD-10-CM

## 2021-08-20 DIAGNOSIS — J302 Other seasonal allergic rhinitis: Secondary | ICD-10-CM

## 2021-08-20 NOTE — Patient Instructions (Addendum)
F/U in 4 months, call if you need me sooner  EXCELLENt labs and happy about weight loss.  No medication changes  Best with new exercise program and continued close attention to your health  Thanks for choosing [facility], we consider it a privelige to serve you.   CBC, chem 7 and EGFR and hBA1c 3 days before next visit

## 2021-08-21 ENCOUNTER — Ambulatory Visit (INDEPENDENT_AMBULATORY_CARE_PROVIDER_SITE_OTHER): Payer: BC Managed Care – PPO | Admitting: Psychology

## 2021-08-21 DIAGNOSIS — F319 Bipolar disorder, unspecified: Secondary | ICD-10-CM | POA: Diagnosis not present

## 2021-08-21 LAB — CMP14+EGFR
ALT: 10 IU/L (ref 0–32)
AST: 10 IU/L (ref 0–40)
Albumin/Globulin Ratio: 1.8 (ref 1.2–2.2)
Albumin: 4.4 g/dL (ref 3.8–4.8)
Alkaline Phosphatase: 64 IU/L (ref 44–121)
BUN/Creatinine Ratio: 10 (ref 9–23)
BUN: 8 mg/dL (ref 6–24)
Bilirubin Total: 0.4 mg/dL (ref 0.0–1.2)
CO2: 25 mmol/L (ref 20–29)
Calcium: 9.9 mg/dL (ref 8.7–10.2)
Chloride: 102 mmol/L (ref 96–106)
Creatinine, Ser: 0.78 mg/dL (ref 0.57–1.00)
Globulin, Total: 2.5 g/dL (ref 1.5–4.5)
Glucose: 83 mg/dL (ref 70–99)
Potassium: 4.3 mmol/L (ref 3.5–5.2)
Sodium: 139 mmol/L (ref 134–144)
Total Protein: 6.9 g/dL (ref 6.0–8.5)
eGFR: 95 mL/min/{1.73_m2} (ref >59)

## 2021-08-21 LAB — LIPID PANEL
Chol/HDL Ratio: 1.9 ratio (ref 0.0–4.4)
Cholesterol, Total: 106 mg/dL (ref 100–199)
HDL: 57 mg/dL (ref >39)
LDL Chol Calc (NIH): 32 mg/dL (ref 0–99)
Triglycerides: 88 mg/dL (ref 0–149)
VLDL Cholesterol Cal: 17 mg/dL (ref 5–40)

## 2021-08-21 LAB — MICROALBUMIN / CREATININE URINE RATIO
Creatinine, Urine: 232.8 mg/dL
Microalb/Creat Ratio: 25 mg/g creat (ref 0–29)
Microalbumin, Urine: 57.7 ug/mL

## 2021-08-21 LAB — VITAMIN D 25 HYDROXY (VIT D DEFICIENCY, FRACTURES): Vit D, 25-Hydroxy: 83.7 ng/mL (ref 30.0–100.0)

## 2021-08-21 LAB — HEMOGLOBIN A1C
Est. average glucose Bld gHb Est-mCnc: 151 mg/dL
Hgb A1c MFr Bld: 6.9 % — ABNORMAL HIGH (ref 4.8–5.6)

## 2021-08-21 LAB — TSH: TSH: 1.58 u[IU]/mL (ref 0.450–4.500)

## 2021-08-21 LAB — MAGNESIUM: Magnesium: 1.6 mg/dL (ref 1.6–2.3)

## 2021-08-21 NOTE — Progress Notes (Signed)
[facility] Counselor/Therapist Progress Note  Patient ID: Christina [name], MRN: [id number],    Date: 08/21/2021  Time Spent: 9:00am-9:52am  Treatment Type: Individual Therapy  Pt is seen for a virtual audio visit.  Internet access prevents video access.  Pt joins from her car outside of work and counselor from her home office.  Reported Symptoms:  Pt endorses grief and feeling overwhelmed.   Mental Status Exam: Appearance:  NA     Behavior: Appropriate  Motor: N/a  Speech/Language:  Normal Rate  Affect: Appropriate  Mood: normal  Thought process: normal  Thought content:   WNL  Sensory/Perceptual disturbances:   WNL  Orientation: oriented to person, place, time/date, and situation  Attention: Good  Concentration: Good  Memory: WNL  Fund of knowledge:  Good  Insight:   Good  Judgment:  Good  Impulse Control: Good   Risk Assessment: Danger to Self:  No Self-injurious Behavior: No Danger to Others: No Duty to Warn:no Physical Aggression / Violence:No   Subjective: Counselor assessed pt current functioning per pt report.  Processed w/pt dad's birthday, recent death of colleague and mood. Reflected insight about grief and validated and normalized.  Discussed feeling overwhelmed and discussed how coping and reflecting progress on some of to [name].   Explored job stressors.  Pt affect wnl.  Pt reports grieving w/ dad's birthday. Pt discussed how board member died and how impact for her w/ her father's death and interactions w/ this person. Pt discussed how engaged w/ family for father's birthday and how planning for deceased grandmother's birthday this year w/ extended family.  Pt identified stressors at work w/ recent incidents and w/ political aspects. Pt discussed what she values w/her work and how to maintain this for self.  Interventions: Cognitive Behavioral Therapy, Humanistic/Existential, Insight-Oriented, and supportive  Diagnosis:Bipolar I disorder  ([name])  Plan: Pt to f/u w/ cousneling in 1 week to assist coping w/ grief and mood stability.   Pt to f/u w/ [name] as scheduled.   Treatment Plan Client Abilities/Strengths  supports: family- partner and parents, friends positives: making a plan and sticking to it i had the confidence to pursue promotion for first time.  Client Treatment Preferences  biweekly counseling and continue medication management [name].  Client Statement of Needs  pt "continue strategies shared and use when recognize when going into my highs or lows or in between". Continue same- " i just need to maintain." 01/31/21 grief counseling with death of her father.  Treatment Level  outpatient counseling  Symptoms  death of father 2021/03/28. History of at least one hypomanic, manic, or mixed mood episode.Marland Kitchen History of chronic or recurrent depression for which the client has taken antidepressant medication,  had outpatient treatment, or had a course of electroconvulsive therapy.  Problems Addressed  Grief / Loss Unresolved, Bipolar Disorde, Bipolar Disorder  Goals 1. Begin a healthy grieving process around the loss. Objective Begin verbalizing feelings associated with the loss. Target Date: 2022-04-15 Frequency: Daily  Progress: 0 Modality: individual  Related Interventions Assist the client in identifying and expressing feelings connected with his/her loss. 2. continue to use calming skills to assist coping w/ stress and managing mood Objective Maintain positive self care, use of mindfulness and other grounding activities to maintain mood stability. Target Date: 01/31/2022 Frequency: Daily  Progress: 70 Modality: individual  Related Interventions Assist the client in establishing a routine pattern of daily activities such as sleeping, eating, solitary and social  activities, and exercise; use of relaxation and grounding strategies routine and assist in assessing mood. 3. Develop healthy cognitive patterns and  beliefs about self and the world that lead to alleviation and help prevent the relapse of mood episodes. Objective Discuss and resolve troubling personal and interpersonal issues. Target Date: 2022-01-21 Frequency: Daily  Progress: 30 Modality: individual  Related Interventions Use interpersonal therapy techniques to explore and resolve issues surrounding marital stress, role disputes, role transitions, and conflict skills/boundaries; provide support and strategies for resolving identified interpersonal issues. Objective Identify and replace thoughts and behaviors that trigger manic or depressive symptoms. Target Date: 2022-01-21 Frequency: Daily  Progress: 60 Modality: individual  Related Interventions uitilize CBT strategies to assist in challenging negative thought patterns and beliefs.  Client participated in Treatment plan development and provided verbal consent.   [name] [facility]              [place], [facility]

## 2021-08-23 ENCOUNTER — Encounter: Payer: Self-pay | Admitting: Family Medicine

## 2021-08-23 NOTE — Assessment & Plan Note (Signed)
Christina [name] is reminded of the importance of commitment to daily physical activity for 30 minutes or more, as able and the need to limit carbohydrate intake to 30 to 60 grams per meal to help with blood sugar control.   The need to take medication as prescribed, test blood sugar as directed, and to call between visits if there is a concern that blood sugar is uncontrolled is also discussed.   Christina [name] is reminded of the importance of daily foot exam, annual eye examination, and good blood sugar, blood pressure and cholesterol control. Markedly improved  Diabetic Labs Latest Ref Rng & Units 08/19/2021 03/26/2021 12/11/2020 09/18/2020 01/12/2020  HbA1c 4.8 - 5.6 % 6.9(H) 7.8(A) - - -  Microalbumin Not Estab. ug/mL - - - - -  Micro/Creat Ratio 0 - 29 mg/g creat 25 - - - -  Chol 100 - 199 mg/dL 106 - 112 122 124  HDL >39 mg/dL 57 - 71 63 66  Calc LDL 0 - 99 mg/dL 32 - 25 43 41  Triglycerides 0 - 149 mg/dL 88 - 78 85 88  Creatinine 0.57 - 1.00 mg/dL 0.78 - 0.82 0.82 0.83   BP/Weight 08/20/2021 03/26/2021 12/31/2020 12/05/2020 08/26/2020 07/30/2020 56/81/2751  Systolic BP 700 174 - 944 967 591 638  Diastolic BP 78 75 - 82 91 89 86  Wt. (Lbs) 216.12 226.12 225 227 221 226 226.04  BMI 35.96 38.21 38.02 37.77 37.35 37.61 38.2  Some encounter information is confidential and restricted. Go to Review Flowsheets activity to see all data.   Foot/eye exam completion dates Latest Ref Rng & Units 03/26/2021 05/07/2020  Eye Exam No Retinopathy - -  Foot Form Completion - Done Done

## 2021-08-23 NOTE — Progress Notes (Signed)
Christina [name]     MRN: [id number]      DOB: [birth year]   HPI [name] is here for follow up and re-evaluation of chronic medical conditions, medication management and review of any available recent lab and radiology data.  Preventive health is updated, specifically  Cancer screening and Immunization.   Questions or concerns regarding consultations or procedures which the PT has had in the interim are  addressed. The PT denies any adverse reactions to current medications since the last visit.  There are no new concerns.  There are no specific complaints   ROS Denies recent fever or chills. Denies sinus pressure, nasal congestion, ear pain or sore throat. Denies chest congestion, productive cough or wheezing. Denies chest pains, palpitations and leg swelling Denies abdominal pain, nausea, vomiting,diarrhea or constipation.   Denies dysuria, frequency, hesitancy or incontinence. Denies joint pain, swelling and limitation in mobility. Denies headaches, seizures, numbness, or tingling. Denies depression, anxiety or insomnia. Denies skin break down or rash. Denies polyuria, polydipsia, blurred vision , or hypoglycemic episodes. Plans to start a regular exercise program at home  PE  BP 110/78    Pulse 95    Ht 5\' 5"  (1.651 m)    Wt 216 lb 1.9 oz (98 kg)    SpO2 97%    BMI 35.96 kg/m   Patient alert and oriented and in no cardiopulmonary distress.  HEENT: No facial asymmetry, EOMI,     Neck supple .  Chest: Clear to auscultation bilaterally.  CVS: S1, S2 no murmurs, no S3.Regular rate.  ABD: Soft non tender.   Ext: No edema  MS: Adequate ROM spine, shoulders, hips and knees.  Skin: Intact, no ulcerations or rash noted.  Psych: Good eye contact, normal affect. Memory intact not anxious or depressed appearing.  CNS: CN 2-12 intact, power,  normal throughout.no focal deficits noted.   Assessment & Plan  Essential hypertension, benign Controlled, no change in  medication DASH diet and commitment to daily physical activity for a minimum of 30 minutes discussed and encouraged, as a part of hypertension management. The importance of attaining a healthy weight is also discussed.  BP/Weight 08/20/2021 03/26/2021 12/31/2020 12/05/2020 08/26/2020 07/30/2020 24/26/8341  Systolic BP 962 229 - 798 921 194 174  Diastolic BP 78 75 - 82 91 89 86  Wt. (Lbs) 216.12 226.12 225 227 221 226 226.04  BMI 35.96 38.21 38.02 37.77 37.35 37.61 38.2  Some encounter information is confidential and restricted. Go to Review Flowsheets activity to see all data.       Morbid obesity ([place])  Patient re-educated about  the importance of commitment to a  minimum of 150 minutes of exercise per week as able.  The importance of healthy food choices with portion control discussed, as well as eating regularly and within a 12 hour window most days. The need to choose "clean , green" food 50 to 75% of the time is discussed, as well as to make water the primary drink and set a goal of 64 ounces water daily.    Weight /BMI 08/20/2021 03/26/2021 12/31/2020  WEIGHT 216 lb 1.9 oz 226 lb 1.9 oz 225 lb  HEIGHT 5\' 5"  5' 4.5" 5' 4.5"  BMI 35.96 kg/m2 38.21 kg/m2 38.02 kg/m2  Some encounter information is confidential and restricted. Go to Review Flowsheets activity to see all data.      Diabetes mellitus ([place]) [name] is reminded of the importance of commitment to daily physical activity for 30  minutes or more, as able and the need to limit carbohydrate intake to 30 to 60 grams per meal to help with blood sugar control.   The need to take medication as prescribed, test blood sugar as directed, and to call between visits if there is a concern that blood sugar is uncontrolled is also discussed.   [name] is reminded of the importance of daily foot exam, annual eye examination, and good blood sugar, blood pressure and cholesterol control. Markedly improved  Diabetic Labs Latest Ref Rng &  Units 08/19/2021 03/26/2021 12/11/2020 09/18/2020 01/12/2020  HbA1c 4.8 - 5.6 % 6.9(H) 7.8(A) - - -  Microalbumin Not Estab. ug/mL - - - - -  Micro/Creat Ratio 0 - 29 mg/g creat 25 - - - -  Chol 100 - 199 mg/dL 106 - 112 122 124  HDL >39 mg/dL 57 - 71 63 66  Calc LDL 0 - 99 mg/dL 32 - 25 43 41  Triglycerides 0 - 149 mg/dL 88 - 78 85 88  Creatinine 0.57 - 1.00 mg/dL 0.78 - 0.82 0.82 0.83   BP/Weight 08/20/2021 03/26/2021 12/31/2020 12/05/2020 08/26/2020 07/30/2020 94/32/7614  Systolic BP 709 295 - 747 340 370 964  Diastolic BP 78 75 - 82 91 89 86  Wt. (Lbs) 216.12 226.12 225 227 221 226 226.04  BMI 35.96 38.21 38.02 37.77 37.35 37.61 38.2  Some encounter information is confidential and restricted. Go to Review Flowsheets activity to see all data.   Foot/eye exam completion dates Latest Ref Rng & Units 03/26/2021 05/07/2020  Eye Exam No Retinopathy - -  Foot Form Completion - Done Done        Bipolar 1 disorder, mixed, moderate (HCC) Stable and managed by Psych  Seasonal allergies Controlled, no change in medication

## 2021-08-23 NOTE — Assessment & Plan Note (Signed)
Controlled, no change in medication  

## 2021-08-23 NOTE — Assessment & Plan Note (Signed)
Stable and managed by Psych 

## 2021-08-23 NOTE — Assessment & Plan Note (Signed)
°  Patient re-educated about  the importance of commitment to a  minimum of 150 minutes of exercise per week as able.  The importance of healthy food choices with portion control discussed, as well as eating regularly and within a 12 hour window most days. The need to choose "clean , green" food 50 to 75% of the time is discussed, as well as to make water the primary drink and set a goal of 64 ounces water daily.    Weight /BMI 08/20/2021 03/26/2021 12/31/2020  WEIGHT 216 lb 1.9 oz 226 lb 1.9 oz 225 lb  HEIGHT 5\' 5"  5' 4.5" 5' 4.5"  BMI 35.96 kg/m2 38.21 kg/m2 38.02 kg/m2  Some encounter information is confidential and restricted. Go to Review Flowsheets activity to see all data.

## 2021-08-23 NOTE — Assessment & Plan Note (Signed)
Controlled, no change in medication DASH diet and commitment to daily physical activity for a minimum of 30 minutes discussed and encouraged, as a part of hypertension management. The importance of attaining a healthy weight is also discussed.  BP/Weight 08/20/2021 03/26/2021 12/31/2020 12/05/2020 08/26/2020 07/30/2020 79/15/0413  Systolic BP 643 837 - 793 968 864 847  Diastolic BP 78 75 - 82 91 89 86  Wt. (Lbs) 216.12 226.12 225 227 221 226 226.04  BMI 35.96 38.21 38.02 37.77 37.35 37.61 38.2  Some encounter information is confidential and restricted. Go to Review Flowsheets activity to see all data.

## 2021-08-27 ENCOUNTER — Ambulatory Visit: Payer: BC Managed Care – PPO | Admitting: Psychology

## 2021-09-02 ENCOUNTER — Ambulatory Visit (INDEPENDENT_AMBULATORY_CARE_PROVIDER_SITE_OTHER): Payer: BC Managed Care – PPO | Admitting: Psychology

## 2021-09-02 DIAGNOSIS — F319 Bipolar disorder, unspecified: Secondary | ICD-10-CM

## 2021-09-02 NOTE — Progress Notes (Signed)
[facility] Counselor/Therapist Progress Note  Patient ID: Christina [name], MRN: [id number],    Date: 09/02/2021  Time Spent: 9:00am-9:50am  Treatment Type: Individual Therapy  Pt is seen for a virtual audio visit.  Internet access prevents video access.  Pt joins from her car outside of work and counselor from her home office.  Reported Symptoms:  Pt endorses grief and work stressors.   Mental Status Exam: Appearance:  NA     Behavior: Appropriate  Motor: N/a  Speech/Language:  Normal Rate  Affect: Appropriate  Mood: normal  Thought process: normal  Thought content:   WNL  Sensory/Perceptual disturbances:   WNL  Orientation: oriented to person, place, time/date, and situation  Attention: Good  Concentration: Good  Memory: WNL  Fund of knowledge:  Good  Insight:   Good  Judgment:  Good  Impulse Control: Good   Risk Assessment: Danger to Self:  No Self-injurious Behavior: No Danger to Others: No Duty to Warn:no Physical Aggression / Violence:No   Subjective: Counselor assessed pt current functioning per pt report.  Processed w/pt mood, grief and stressors. Validated emotions of grief and at times feeling empty w/not having dad present to engage with.  Explored w/ pt ways connecting w/ values and positives of sharing memories.   Discussed job stressors and reflected how positive that took time for self today and continuing w/ finding ways to take time for self.  Pt affect wnl.  Pt reports stress w/ work grant due today and last minute changes presented to her.  Pt was encouraging self w/ what was in her control and still taking time for self w/her appointment today.  Pt reported on a lot of things happening her family and milestones and empty w/ not being able to share w/ dad.  Pt discussed how connects w/ dad through continuing on values.  Pt recognized that she has been able to share memories about dad that represents him w/ others now and feels good to do  that.  Pt discussed that has been engaged w/ assisting mom w/ grandmother and dad's estate process.  Interventions: Cognitive Behavioral Therapy, Humanistic/Existential, Insight-Oriented, and supportive  Diagnosis:Bipolar I disorder ([place])  Plan: Pt to f/u w/ cousneling in 1 week to assist coping w/ grief and mood stability.   Pt to f/u w/ [name] as scheduled.   Treatment Plan Client Abilities/Strengths  supports: family- partner and parents, friends positives: making a plan and sticking to it i had the confidence to pursue promotion for first time.  Client Treatment Preferences  biweekly counseling and continue medication management [name].  Client Statement of Needs  pt "continue strategies shared and use when recognize when going into my highs or lows or in between". Continue same- " i just need to maintain." 01/25/21 grief counseling with death of her father.  Treatment Level  outpatient counseling  Symptoms  death of father 2021-03-22. History of at least one hypomanic, manic, or mixed mood episode.Marland Kitchen History of chronic or recurrent depression for which the client has taken antidepressant medication,  had outpatient treatment, or had a course of electroconvulsive therapy.  Problems Addressed  Grief / Loss Unresolved, Bipolar Disorde, Bipolar Disorder  Goals 1. Begin a healthy grieving process around the loss. Objective Begin verbalizing feelings associated with the loss. Target Date: 2022-04-15 Frequency: Daily  Progress: 0 Modality: individual  Related Interventions Assist the client in identifying and expressing feelings connected with his/her loss. 2. continue to use  calming skills to assist coping w/ stress and managing mood Objective Maintain positive self care, use of mindfulness and other grounding activities to maintain mood stability. Target Date: 2022-01-21 Frequency: Daily  Progress: 70 Modality: individual  Related Interventions Assist the client in establishing a  routine pattern of daily activities such as sleeping, eating, solitary and social activities, and exercise; use of relaxation and grounding strategies routine and assist in assessing mood. 3. Develop healthy cognitive patterns and beliefs about self and the world that lead to alleviation and help prevent the relapse of mood episodes. Objective Discuss and resolve troubling personal and interpersonal issues. Target Date: 2022-01-21 Frequency: Daily  Progress: 30 Modality: individual  Related Interventions Use interpersonal therapy techniques to explore and resolve issues surrounding marital stress, role disputes, role transitions, and conflict skills/boundaries; provide support and strategies for resolving identified interpersonal issues. Objective Identify and replace thoughts and behaviors that trigger manic or depressive symptoms. Target Date: 2022-01-21 Frequency: Daily  Progress: 60 Modality: individual  Related Interventions uitilize CBT strategies to assist in challenging negative thought patterns and beliefs.  Client participated in Treatment plan development and provided verbal consent.    Christina [name], [facility]

## 2021-09-03 DIAGNOSIS — E119 Type 2 diabetes mellitus without complications: Secondary | ICD-10-CM | POA: Diagnosis not present

## 2021-09-09 ENCOUNTER — Ambulatory Visit (INDEPENDENT_AMBULATORY_CARE_PROVIDER_SITE_OTHER): Payer: BC Managed Care – PPO | Admitting: Psychology

## 2021-09-09 DIAGNOSIS — F4321 Adjustment disorder with depressed mood: Secondary | ICD-10-CM

## 2021-09-09 DIAGNOSIS — F319 Bipolar disorder, unspecified: Secondary | ICD-10-CM

## 2021-09-09 NOTE — Progress Notes (Signed)
[facility] Counselor/Therapist Progress Note  Patient ID: RASHEMA SEAWRIGHT, MRN: [id number],    Date: 09/09/2021  Time Spent: 10:00am-10:50am  Treatment Type: Individual Therapy  Pt is seen for a virtual video visit via webex.pt joins from her car at home and counselor from her home office.  Reported Symptoms:  Pt endorsed grief and work stressors.   Mental Status Exam: Appearance:  NA     Behavior: Appropriate  Motor: N/a  Speech/Language:  Normal Rate  Affect: Appropriate  Mood: normal  Thought process: normal  Thought content:   WNL  Sensory/Perceptual disturbances:   WNL  Orientation: oriented to person, place, time/date, and situation  Attention: Good  Concentration: Good  Memory: WNL  Fund of knowledge:  Good  Insight:   Good  Judgment:  Good  Impulse Control: Good   Risk Assessment: Danger to Self:  No Self-injurious Behavior: No Danger to Others: No Duty to Warn:no Physical Aggression / Violence:No   Subjective: Counselor assessed pt current functioning per pt report.  Processed w/pt mood, grief and current stressors.  Reflected pt insights and how transition for her family and positives that exploring together next steps.  Pt affect wnl.  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  Pt discussed how keeping out of to minimize her stress. Pt reflected on how not happy w/her job and recognizing that will be shift for her in some way.  Pt discussed family transitions and how exploring different options and how to best support family together.  Pt reports that have been focus for her and husband.  Pt discussed how misses her father's insight and perspective.    Interventions: Cognitive Behavioral Therapy, Humanistic/Existential, Insight-Oriented, and supportive  Diagnosis:Bipolar I disorder ([place])  Grief  Plan: Pt to f/u w/ cousneling in 1 week to assist coping w/ grief and mood stability.   Pt to f/u w/ [name] as scheduled.   Treatment  Plan Client Abilities/Strengths  supports: family- partner and parents, friends positives: making a plan and sticking to it i had the confidence to pursue promotion for first time.  Client Treatment Preferences  biweekly counseling and continue medication management [name].  Client Statement of Needs  pt "continue strategies shared and use when recognize when going into my highs or lows or in between". Continue same- " i just need to maintain." 02/18/2021 grief counseling with death of her father.  Treatment Level  outpatient counseling  Symptoms  death of father 04-15-2021. History of at least one hypomanic, manic, or mixed mood episode.Marland Kitchen History of chronic or recurrent depression for which the client has taken antidepressant medication,  had outpatient treatment, or had a course of electroconvulsive therapy.  Problems Addressed  Grief / Loss Unresolved, Bipolar Disorde, Bipolar Disorder  Goals 1. Begin a healthy grieving process around the loss. Objective Begin verbalizing feelings associated with the loss. Target Date: 2022-04-15 Frequency: Daily  Progress: 0 Modality: individual  Related Interventions Assist the client in identifying and expressing feelings connected with his/her loss. 2. continue to use calming skills to assist coping w/ stress and managing mood Objective Maintain positive self care, use of mindfulness and other grounding activities to maintain mood stability. Target Date: 02-18-22 Frequency: Daily  Progress: 70 Modality: individual  Related Interventions Assist the client in establishing a routine pattern of daily activities such as sleeping, eating, solitary and social activities, and exercise; use of relaxation and grounding strategies routine and assist in assessing mood. 3. Develop  healthy cognitive patterns and beliefs about self and the world that lead to alleviation and help prevent the relapse of mood episodes. Objective Discuss and resolve troubling  personal and interpersonal issues. Target Date: 2022-01-21 Frequency: Daily  Progress: 30 Modality: individual  Related Interventions Use interpersonal therapy techniques to explore and resolve issues surrounding marital stress, role disputes, role transitions, and conflict skills/boundaries; provide support and strategies for resolving identified interpersonal issues. Objective Identify and replace thoughts and behaviors that trigger manic or depressive symptoms. Target Date: 2022-01-21 Frequency: Daily  Progress: 60 Modality: individual  Related Interventions uitilize CBT strategies to assist in challenging negative thought patterns and beliefs.  Client participated in Treatment plan development and provided verbal consent.    [name], [facility]

## 2021-09-15 ENCOUNTER — Ambulatory Visit: Payer: BC Managed Care – PPO | Admitting: Psychology

## 2021-09-18 ENCOUNTER — Other Ambulatory Visit: Payer: Self-pay

## 2021-09-18 ENCOUNTER — Encounter: Payer: Self-pay | Admitting: Internal Medicine

## 2021-09-18 ENCOUNTER — Ambulatory Visit: Payer: BC Managed Care – PPO | Admitting: Internal Medicine

## 2021-09-18 DIAGNOSIS — J069 Acute upper respiratory infection, unspecified: Secondary | ICD-10-CM

## 2021-09-18 DIAGNOSIS — Z20822 Contact with and (suspected) exposure to covid-19: Secondary | ICD-10-CM

## 2021-09-18 MED ORDER — BENZONATATE 100 MG PO CAPS: 100.0000 mg | ORAL_CAPSULE | Freq: Two times a day (BID) | ORAL | 0 refills | Status: DC | PRN

## 2021-09-18 NOTE — Progress Notes (Signed)
Virtual Visit via Telephone Note   This visit type was conducted due to national recommendations for restrictions regarding the COVID-19 Pandemic (e.g. social distancing) in an effort to limit this patient's exposure and mitigate transmission in our community.  Due to her co-morbid illnesses, this patient is at least at moderate risk for complications without adequate follow up.  This format is felt to be most appropriate for this patient at this time.  The patient did not have access to video technology/had technical difficulties with video requiring transitioning to audio format only (telephone).  All issues noted in this document were discussed and addressed.  No physical exam could be performed with this format.  Evaluation Performed:  Follow-up visit  Date:  09/18/2021   ID:  Christina [name], DOB [birth year], MRN [id number]  Patient Location: Home Provider Location: Office/Clinic  Participants: Patient Location of Patient: Home Location of Provider: Telehealth Consent was obtain for visit to be over via telehealth. I verified that I am speaking with the correct person using two identifiers.  PCP:  [name], MD   Chief Complaint: Cough, nasal congestion and fever  History of Present Illness:    Christina [name] is a 46 y.o. female has a televisit for complaint of cough, nasal congestion, postnasal drip and fever since yesterday.  She denies any dyspnea or wheezing currently.  She has tried taking Norel AD and Tessalon Perles with some relief.  The patient does have symptoms concerning for COVID-19 infection (fever, chills, cough, or new shortness of breath).   Past Medical, Surgical, Social History, Allergies, and Medications have been Reviewed.  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 Oneida Alar: Torturous transverse and sigmoid colon, internal hemorrhoids.  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 mild chronic gastritis on path.    ESOPHAGOGASTRODUODENOSCOPY (EGD) WITH PROPOFOL N/A 10/26/2017   Dr. Oneida Alar: Gastritis, gastric polyps.  Biopsies benign.  Small bowel biopsies negative for celiac.  No H. pylori.   HERNIA REPAIR  12/2018   REDUCTION MAMMAPLASTY     1994   TRIGGER FINGER RELEASE Bilateral 06/2015   TUBAL LIGATION Bilateral 03/06/2016   Procedure: BILATERAL TUBAL LIGATION;  Surgeon: Ena Dawley, MD;  Location: [facility];  Service: Obstetrics;  Laterality: Bilateral;  Current Meds  Medication Sig   acetaminophen (TYLENOL) 500 MG tablet Take 1,000 mg every 8 (eight) hours as needed by mouth for mild pain.    ARIPiprazole (ABILIFY) 5 MG tablet Take 1 tablet (5 mg total) by mouth every evening.   azelastine (ASTELIN) 0.1 % nasal spray Place 2 sprays into both nostrils 2 (two) times daily. Use in each  nostril as directed   B-D UF III MINI PEN NEEDLES 31G X 5 MM MISC USE AS DIRECTED   benzonatate (TESSALON) 100 MG capsule Take 1 capsule (100 mg total) by mouth 2 (two) times daily as needed for cough.   Chlorphen-PE-Acetaminophen (NOREL AD) 4-10-325 MG TABS Take 1 tablet by mouth 3 (three) times daily as needed (Nasal congestion).   fluticasone (FLONASE) 50 MCG/ACT nasal spray Place 2 sprays into both nostrils daily.   glucose blood test strip Use as instructed, three times daily   levocetirizine (XYZAL) 5 MG tablet Take 5 mg by mouth every evening.   LORazepam (ATIVAN) 0.5 MG tablet Take 1/2 to one tab as needed for severe anxiety   losartan (COZAAR) 50 MG tablet Take 1 tablet (50 mg total) by mouth daily.   Magnesium 200 MG TABS Take 1 tablet by mouth daily.   metFORMIN (GLUCOPHAGE) 1000 MG tablet TAKE 1 TABLET BY MOUTH TWICE DAILY WITH MEALS   montelukast (SINGULAIR) 10 MG tablet Take 1 tablet (10 mg total) by mouth at bedtime.   omeprazole (PRILOSEC) 40 MG capsule TAKE 1 CAPSULE BY MOUTH TWICE DAILY BEFORE A MEAL   ONETOUCH DELICA LANCETS 78E MISC Three times daily testing dx e11.9   OZEMPIC, 1 MG/DOSE, 2 MG/1.5ML SOPN Inject 1 mg into the skin once a week.   rosuvastatin (CRESTOR) 5 MG tablet Take one tablet by mouth three times weekly   sodium chloride (OCEAN) 0.65 % SOLN nasal spray Place 1 spray into both nostrils 4 (four) times daily as needed for congestion.   TRESIBA FLEXTOUCH 200 UNIT/ML FlexTouch Pen Inject 20 Units into the skin at bedtime.   Vitamin D, Ergocalciferol, (DRISDOL) 1.25 MG (50000 UNIT) CAPS capsule Take 1 capsule by mouth once a week     Allergies:   Other, Peanut oil, Peanut-containing drug products, and Augmentin [amoxicillin-pot clavulanate]   ROS:   Please see the history of present illness.     All other systems reviewed and are negative.   Labs/Other Tests and Data Reviewed:    Recent Labs: 12/11/2020: Hemoglobin 12.7; Platelets 337 08/19/2021: ALT  10; BUN 8; Creatinine, Ser 0.78; Magnesium 1.6; Potassium 4.3; Sodium 139; TSH 1.580   Recent Lipid Panel Lab Results  Component Value Date/Time   CHOL 106 08/19/2021 08:31 AM   TRIG 88 08/19/2021 08:31 AM   HDL 57 08/19/2021 08:31 AM   CHOLHDL 1.9 08/19/2021 08:31 AM   CHOLHDL 1.7 03/16/2016 11:15 AM   LDLCALC 32 08/19/2021 08:31 AM    Wt Readings from Last 3 Encounters:  08/20/21 216 lb 1.9 oz (98 kg)  03/26/21 226 lb 1.9 oz (102.6 kg)  12/31/20 225 lb (102.1 kg)     ASSESSMENT & PLAN:    URTI Suspected COVID-19 infection Advised to check rapid COVID test at home and contact us if positive Refilled Tessalon for cough Advised to take Mucinex or Robitussin PRN for cough Work note provided  Time:   Today, I have spent 9 minutes reviewing the chart, including problem list, medications, and with the patient with telehealth technology discussing the above problems.  Medication Adjustments/Labs and Tests Ordered: Current medicines are reviewed at length with the patient today.  Concerns regarding medicines are outlined above.   Tests Ordered: No orders of the defined types were placed in this encounter.   Medication Changes: Meds ordered this encounter  Medications   benzonatate (TESSALON) 100 MG capsule    Sig: Take 1 capsule (100 mg total) by mouth 2 (two) times daily as needed for cough.    Dispense:  30 capsule    Refill:  0     Note: This dictation was prepared with Dragon dictation along with smaller phrase technology. Similar sounding words can be transcribed inadequately or may not be corrected upon review. Any transcriptional errors that result from this process are unintentional.      Disposition:  Follow up  Signed, Lindell Spar, MD  09/18/2021 8:34 AM     [facility]

## 2021-09-22 DIAGNOSIS — J029 Acute pharyngitis, unspecified: Secondary | ICD-10-CM | POA: Diagnosis not present

## 2021-09-22 DIAGNOSIS — R519 Headache, unspecified: Secondary | ICD-10-CM | POA: Diagnosis not present

## 2021-09-22 DIAGNOSIS — J069 Acute upper respiratory infection, unspecified: Secondary | ICD-10-CM | POA: Diagnosis not present

## 2021-09-22 DIAGNOSIS — R059 Cough, unspecified: Secondary | ICD-10-CM | POA: Diagnosis not present

## 2021-09-24 ENCOUNTER — Ambulatory Visit (INDEPENDENT_AMBULATORY_CARE_PROVIDER_SITE_OTHER): Payer: BC Managed Care – PPO | Admitting: Psychology

## 2021-09-24 DIAGNOSIS — F4321 Adjustment disorder with depressed mood: Secondary | ICD-10-CM | POA: Diagnosis not present

## 2021-09-24 DIAGNOSIS — F319 Bipolar disorder, unspecified: Secondary | ICD-10-CM

## 2021-09-24 NOTE — Progress Notes (Signed)
[facility] Counselor/Therapist Progress Note  Patient ID: Christina [name], MRN: [id number],    Date: 09/24/2021  Time Spent: 44:96PR-91:63WG  Treatment Type: Individual Therapy  Pt is seen for a virtual video visit via webex.pt joins from her car at home and counselor from her home office.  Reported Symptoms:  Pt endorsed grief.  Pt reported self awareness.  Mood stable.   Mental Status Exam: Appearance:  NA     Behavior: Appropriate  Motor: N/a  Speech/Language:  Normal Rate  Affect: Appropriate  Mood: normal  Thought process: normal  Thought content:   WNL  Sensory/Perceptual disturbances:   WNL  Orientation: oriented to person, place, time/date, and situation  Attention: Good  Concentration: Good  Memory: WNL  Fund of knowledge:  Good  Insight:   Good  Judgment:  Good  Impulse Control: Good   Risk Assessment: Danger to Self:  No Self-injurious Behavior: No Danger to Others: No Duty to Warn:no Physical Aggression / Violence:No   Subjective: Counselor assessed pt current functioning per pt report.  Processed w/pt mood, grief and current stressors.  Reflected pt insights and normalizing her grief and recognizing how she is coping through.  Pt affect wnl.  Pt reported that she is grieving, but recognized mood has been stable and sleep improved.  Pt reflected on her experience w/ her grief and validating her emotions.  Pt reported on being sick and had to slow down w/ that.  Pt recognized as positive.  Pt discussed work stressors and identifying her plans for approaching.   Interventions: Cognitive Behavioral Therapy, Humanistic/Existential, Insight-Oriented, and supportive  Diagnosis:Bipolar I disorder ([place])  Grief  Plan: Pt to f/u w/ cousneling in 2 weeks to assist coping w/ grief and mood stability.   Pt to f/u w/ [name] as scheduled.   Treatment Plan Client Abilities/Strengths  supports: family- partner and parents, friends positives: making  a plan and sticking to it i had the confidence to pursue promotion for first time.  Client Treatment Preferences  biweekly counseling and continue medication management [name].  Client Statement of Needs  pt "continue strategies shared and use when recognize when going into my highs or lows or in between". Continue same- " i just need to maintain." 02-18-2021 grief counseling with death of her father.  Treatment Level  outpatient counseling  Symptoms  death of father 04/15/21. History of at least one hypomanic, manic, or mixed mood episode.Marland Kitchen History of chronic or recurrent depression for which the client has taken antidepressant medication,  had outpatient treatment, or had a course of electroconvulsive therapy.  Problems Addressed  Grief / Loss Unresolved, Bipolar Disorde, Bipolar Disorder  Goals 1. Begin a healthy grieving process around the loss. Objective Begin verbalizing feelings associated with the loss. Target Date: 2022-04-15 Frequency: Daily  Progress: 50 Modality: individual  Related Interventions Assist the client in identifying and expressing feelings connected with his/her loss. 2. continue to use calming skills to assist coping w/ stress and managing mood Objective Maintain positive self care, use of mindfulness and other grounding activities to maintain mood stability. Target Date: 02/18/2022 Frequency: Daily  Progress: 70 Modality: individual  Related Interventions Assist the client in establishing a routine pattern of daily activities such as sleeping, eating, solitary and social activities, and exercise; use of relaxation and grounding strategies routine and assist in assessing mood. 3. Develop healthy cognitive patterns and beliefs about self and the world that lead to alleviation and help  prevent the relapse of mood episodes. Objective Discuss and resolve troubling personal and interpersonal issues. Target Date: 2022-01-21 Frequency: Daily  Progress: 30 Modality:  individual  Related Interventions Use interpersonal therapy techniques to explore and resolve issues surrounding marital stress, role disputes, role transitions, and conflict skills/boundaries; provide support and strategies for resolving identified interpersonal issues. Objective Identify and replace thoughts and behaviors that trigger manic or depressive symptoms. Target Date: 2022-01-21 Frequency: Daily  Progress: 60 Modality: individual  Related Interventions uitilize CBT strategies to assist in challenging negative thought patterns and beliefs.  Client participated in Treatment plan development and provided verbal consent.    [name], [facility]

## 2021-10-01 DIAGNOSIS — E119 Type 2 diabetes mellitus without complications: Secondary | ICD-10-CM | POA: Diagnosis not present

## 2021-10-08 ENCOUNTER — Ambulatory Visit (INDEPENDENT_AMBULATORY_CARE_PROVIDER_SITE_OTHER): Payer: BC Managed Care – PPO | Admitting: Psychology

## 2021-10-08 DIAGNOSIS — F319 Bipolar disorder, unspecified: Secondary | ICD-10-CM

## 2021-10-08 DIAGNOSIS — F4321 Adjustment disorder with depressed mood: Secondary | ICD-10-CM | POA: Diagnosis not present

## 2021-10-08 NOTE — Progress Notes (Signed)
? ? ? ?  [facility] Counselor/Therapist Progress Note ? ?Patient ID: Christina [name], MRN: [id number],   ? ?Date: 10/08/2021 ? ?Time Spent: 10:00am-10:50am ? ?Treatment Type: Individual Therapy  Pt is seen for a virtual video visit via webex.pt joins from her car at home and counselor from her home office. ? ?Reported Symptoms:  Pt endorsed grief.  Pt reported self awareness.  Mood stable.   ?Mental Status Exam: ?Appearance:  NA     ?Behavior: Appropriate  ?Motor: N/a  ?Speech/Language:  Normal Rate  ?Affect: Appropriate  ?Mood: normal  ?Thought process: normal  ?Thought content:   WNL  ?Sensory/Perceptual disturbances:   WNL  ?Orientation: oriented to person, place, time/date, and situation  ?Attention: Good  ?Concentration: Good  ?Memory: WNL  ?Fund of knowledge:  Good  ?Insight:   Good  ?Judgment:  Good  ?Impulse Control: Good  ? ?Risk Assessment: ?Danger to Self:  No ?Self-injurious Behavior: No ?Danger to Others: No ?Duty to Warn:no ?Physical Aggression / Violence:No  ? ?Subjective: Counselor assessed pt current functioning per pt report.  Processed w/pt mood, stressors and positives.  Reflected pt self awareness and clarity about next steps w/ work/school/family.  Discussed positive self care and ways to continue to work in.  Pt affect wnl.  Pt reported mood continues to be stable, continued grief and coping through.  Pt reported that she had a brain break and walked about work and reminded how positive for her.  Pt reflected on clarity gave w/ next steps w/ school, work.  Pt discussed steps taking and how aligned w/ her values.  Pt is looking forward to upcoming vacation w/ her family.   ? ?Interventions: Cognitive Behavioral Therapy, Insight-Oriented, and supportive and ACT ? ?Diagnosis:Bipolar I disorder ([place]) ? ?Grief ? ?Plan: Pt to f/u w/ cousneling in 2 weeks to assist coping w/ grief and mood stability.   Pt to f/u w/ [name] as scheduled.  ? ?Treatment Plan ?Client Abilities/Strengths   ?supports: family- partner and parents, friends positives: making a plan and sticking to it i had the confidence to pursue promotion for first time.  ?Client Treatment Preferences  ?biweekly counseling and continue medication management [name].  ?Client Statement of Needs  ?pt "continue strategies shared and use when recognize when going into my highs or lows or in between". Continue same- " i just need to maintain." Feb 08, 2021 grief counseling with death of her father.  ?Treatment Level  ?outpatient counseling  ?Symptoms  ?death of father 2021-04-04. History of at least one hypomanic, manic, or mixed mood episode.Marland Kitchen History of chronic or recurrent depression for which the client has taken antidepressant medication,  had outpatient treatment, or had a course of electroconvulsive therapy.  ?Problems Addressed  ?Grief / Loss Unresolved, Bipolar Disorde, Bipolar Disorder  ?Goals ?1. Begin a healthy grieving process around the loss. ?Objective ?Begin verbalizing feelings associated with the loss. ?Target Date: 2022-04-15 Frequency: Daily  ?Progress: 50 Modality: individual  ?Related Interventions ?Assist the client in identifying and expressing feelings connected with his/her loss. ?2. continue to use calming skills to assist coping w/ stress and managing mood ?Objective ?Maintain positive self care, use of mindfulness and other grounding activities to maintain mood stability. ?Target Date: 02/08/22 Frequency: Daily  ?Progress: 70 Modality: individual  ?Related Interventions ?Assist the client in establishing a routine pattern of daily activities such as sleeping, eating, solitary and social activities, and exercise; use of relaxation and grounding strategies routine and assist in assessing mood. ?  3. Develop healthy cognitive patterns and beliefs about self and the world that lead to alleviation and help prevent the relapse of mood episodes. ?Objective ?Discuss and resolve troubling personal and interpersonal  issues. ?Target Date: 2022-01-21 Frequency: Daily  ?Progress: 30 Modality: individual  ?Related Interventions ?Use interpersonal therapy techniques to explore and resolve issues surrounding marital stress, role disputes, role transitions, and conflict skills/boundaries; provide support and strategies for resolving identified interpersonal issues. ?Objective ?Identify and replace thoughts and behaviors that trigger manic or depressive symptoms. ?Target Date: 2022-01-21 Frequency: Daily  ?Progress: 60 Modality: individual  ?Related Interventions ?uitilize CBT strategies to assist in challenging negative thought patterns and beliefs.  ?Client participated in Treatment plan development and provided verbal consent.  ? ? ? ? ? ? [name], [facility] ?

## 2021-10-11 NOTE — Progress Notes (Signed)
Referring Provider: [name], MD Primary Care Physician:  [name], MD Primary GI Physician: Dr. Abbey Chatters  Chief Complaint  Patient presents with   Abdominal Pain    Feeling better. Medication refill     HPI:   Christina [name] is a 46 y.o. female with history of GERD and gastroparesis.  EGD and colonoscopy in 2019.  No polyps on colonoscopy, due for repeat in 2029.  EGD with gastritis, fundic gland polyps, gastric biopsies benign, small bowel biopsies negative for celiac.  Last seen in our office 12/05/2020.  Her omeprazole had been increased from 20 mg daily to 40 mg twice daily and GERD was much improved.  Denied abdominal pain, dysphagia.  She was having some diarrhea after recently increasing her dose of Ozempic.  This had also previously happened when she first started Ozempic, but resolved once her body adjusted to the medication.  Recommended continuing current medications and follow-up in 1 year or sooner if needed.   Patient called in October reporting 24 hours of vomiting, watery diarrhea, fever, nausea, crampy abdominal pain above her navel.  Reported similar symptoms 2 weeks prior again lasting 24 hours.  This was suspected to be viral gastroenteritis.  She was prescribed Zofran and advised to proceed to urgent care or ER for evaluation if symptoms persist more than a few days.  Today: Nausea, vomiting, diarrhea that resolved in about 24 to 48 hours in October and have not returned.  She feels very well at this time.  GERD is well controlled on omeprazole 40 mg daily.  Denies dysphagia, abdominal pain, constipation, diarrhea, BRBPR, or melena.   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 Oneida Alar: Torturous transverse and sigmoid colon, internal hemorrhoids.  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 mild chronic gastritis on path.    ESOPHAGOGASTRODUODENOSCOPY (EGD) WITH PROPOFOL N/A 10/26/2017   Dr. Oneida Alar: Gastritis, gastric polyps.  Biopsies benign.  Small bowel biopsies negative for celiac.  No H. pylori.   HERNIA REPAIR  12/2018   REDUCTION MAMMAPLASTY     1994   TRIGGER FINGER RELEASE Bilateral 06/2015   TUBAL LIGATION Bilateral 03/06/2016   Procedure: BILATERAL TUBAL LIGATION;  Surgeon: Ena Dawley, MD;  Location: [facility];  Service: Obstetrics;  Laterality:  Bilateral;    Current Outpatient Medications  Medication Sig Dispense Refill   acetaminophen (TYLENOL) 500 MG tablet Take 1,000 mg every 8 (eight) hours as needed by mouth for mild pain.      ARIPiprazole (ABILIFY) 5 MG tablet Take 1 tablet (5 mg total) by mouth every evening. 90 tablet 0   azelastine (ASTELIN) 0.1 % nasal spray Place 2 sprays into both nostrils 2 (two) times daily. Use in each nostril as directed 30 mL 12   B-D UF III MINI  PEN NEEDLES 31G X 5 MM MISC USE AS DIRECTED 100 each 0   benzonatate (TESSALON) 100 MG capsule Take 1 capsule (100 mg total) by mouth 2 (two) times daily as needed for cough. 30 capsule 0   fluticasone (FLONASE) 50 MCG/ACT nasal spray Place 2 sprays into both nostrils daily. 48 mL 5   glucose blood test strip Use as instructed, three times daily 300 each 12   levocetirizine (XYZAL) 5 MG tablet Take 5 mg by mouth every evening.     losartan (COZAAR) 50 MG tablet Take 1 tablet (50 mg total) by mouth daily. 90 tablet 1   metFORMIN (GLUCOPHAGE) 1000 MG tablet TAKE 1 TABLET BY MOUTH TWICE DAILY WITH MEALS 180 tablet 0   montelukast (SINGULAIR) 10 MG tablet Take 1 tablet (10 mg total) by mouth at bedtime. 90 tablet 3   ONETOUCH DELICA LANCETS 52W MISC Three times daily testing dx e11.9 300 each 5   OZEMPIC, 1 MG/DOSE, 2 MG/1.5ML SOPN Inject 1 mg into the skin once a week.     rosuvastatin (CRESTOR) 5 MG tablet Take one tablet by mouth three times weekly 36 tablet 3   sodium chloride (OCEAN) 0.65 % SOLN nasal spray Place 1 spray into both nostrils 4 (four) times daily as needed for congestion.     TRESIBA FLEXTOUCH 200 UNIT/ML FlexTouch Pen Inject 20 Units into the skin at bedtime.     omeprazole (PRILOSEC) 40 MG capsule Take 1 capsule (40 mg total) by mouth daily. 90 capsule 3   No current facility-administered medications for this visit.    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   Peanut-containing drug products Anaphylaxis 02/08/2013   Augmentin [amoxicillin-pot clavulanate] Nausea And Vomiting 04/07/2013    Family History  Problem Relation Age of Onset   Diabetes Mother    Hypertension Mother    Fibromyalgia Mother    Stroke Mother    Diabetes Father    Hypertension Father    Asthma Father    Heart disease Father    Depression Father    Colon cancer Neg Hx    Breast cancer  Neg Hx     Social History   Socioeconomic History   Marital status: Married    Spouse name: Not on file   Number of children: Not on file   Years of education: Not on file   Highest education level: Not on file  Occupational History   Not on file  Tobacco Use   Smoking status: Never   Smokeless tobacco: Never   Tobacco comments:    not everyday  Vaping Use   Vaping Use: Never used  Substance and Sexual Activity   Alcohol use: Yes    Alcohol/week: 0.0 standard drinks    Comment: occ   Drug use: No   Sexual activity: Yes    Partners: Male  Birth control/protection: Surgical  Other Topics Concern   Not on file  Social History Narrative   Not on file   Social Determinants of Health   Financial Resource Strain: Not on file  Food Insecurity: Not on file  Transportation Needs: Not on file  Physical Activity: Not on file  Stress: Not on file  Social Connections: Not on file    Review of Systems: Gen: Denies fever, chills, cold or flulike symptoms, presyncope, syncope. CV: Denies chest pain, palpitations. Resp: Denies dyspnea or cough. GI: See HPI Heme: See HPI  Physical Exam: BP 120/66    Pulse 94    Temp 97.7 F (36.5 C) (Temporal)    Ht _0  (1.651 m)    Wt 218 lb 3.2 oz (99 kg)    LMP 10/09/2021    BMI 36.31 kg/m  General:   Alert and oriented. No distress noted. Pleasant and cooperative.  Head:  Normocephalic and atraumatic. Eyes:  Conjuctiva clear without scleral icterus. Heart:  S1, S2 present without murmurs appreciated. Lungs:  Clear to auscultation bilaterally. No wheezes, rales, or rhonchi. No distress.  Abdomen:  +BS, soft, non-tender and non-distended. No rebound or guarding. No HSM or masses noted. Msk:  Symmetrical without gross deformities. Normal posture. Extremities:  Without edema. Neurologic:  Alert and  oriented x4 Psych:  Normal mood and affect.    Assessment: 46 year old female presenting today for follow-up of GERD and  gastroparesis.   GERD: Has been taking omeprazole 40 mg twice daily, but patient decreased to 40 mg once daily on her own and her symptoms continue to be well controlled.  No alarm symptoms.  Gastroparesis:  Clinically asymptomatic.  No nausea or vomiting.  Patient asked today about gastroparesis flares and what to do about them if they occur.  Recommended backing down to clear liquid diet for 24-48 hours and using Zofran every 8 hours as needed.  Also reinforced general gastroparesis diet recommendations including low fiber/low fat diet and 4-6 small meals daily.  Notably, she has not had any significant gastroparesis flare.  She did have an episode of nausea, vomiting, abdominal pain, diarrhea in October lasting 24 to 48 hours, but I suspect this was secondary to gastroenteritis rather than gastroparesis.  Plan: Continue omeprazole 40 mg daily.  1 year supply sent to pharmacy. Low fiber/low fat diet. 4-6 small meals daily. Discussed plan if gastroparesis flare including backing down to clear liquid diet for 24-48 hours and using Zofran as needed.  She will call our office if this occurs as she does not currently have a prescription for Zofran due to no significant problems related to gastroparesis.  Follow-up in 1 year or sooner if needed.   Aliene Altes, PA-C [facility] 10/13/2021

## 2021-10-13 ENCOUNTER — Other Ambulatory Visit: Payer: Self-pay

## 2021-10-13 ENCOUNTER — Encounter: Payer: Self-pay | Admitting: Gastroenterology

## 2021-10-13 ENCOUNTER — Ambulatory Visit: Payer: BC Managed Care – PPO | Admitting: Gastroenterology

## 2021-10-13 VITALS — BP 120/66 | HR 94 | Temp 97.7°F | Ht 65.0 in | Wt 218.2 lb

## 2021-10-13 DIAGNOSIS — K219 Gastro-esophageal reflux disease without esophagitis: Secondary | ICD-10-CM | POA: Diagnosis not present

## 2021-10-13 DIAGNOSIS — K3184 Gastroparesis: Secondary | ICD-10-CM

## 2021-10-13 MED ORDER — OMEPRAZOLE 40 MG PO CPDR: 40.0000 mg | DELAYED_RELEASE_CAPSULE | Freq: Every day | ORAL | 3 refills | Status: DC

## 2021-10-13 NOTE — Patient Instructions (Signed)
Continue taking omeprazole 40 mg daily 30 minutes before breakfast.  I have sent a years worth of refills to your pharmacy. ? ?You are doing very well from a gastroparesis standpoint as you are not having any concerning symptoms. ? ?General gastroparesis diet recommendations: ?Low-fat/low fiber diet. ?4-6 small meals daily. ? ?If you feel like you are having a gastroparesis flare with nausea or vomiting, back down to about a liquid diet for 24 to 48 hours.  He can also call our office for nausea medicine if needed. ? ?It was a pleasure meeting you today!  We will follow-up with you in 1 year or sooner if needed. ? ?Aliene Altes, PA-C ?[facility] ? ?

## 2021-10-23 ENCOUNTER — Ambulatory Visit: Payer: BC Managed Care – PPO | Admitting: Psychology

## 2021-10-29 ENCOUNTER — Ambulatory Visit: Payer: BC Managed Care – PPO | Admitting: Psychology

## 2021-11-01 DIAGNOSIS — E119 Type 2 diabetes mellitus without complications: Secondary | ICD-10-CM | POA: Diagnosis not present

## 2021-11-04 ENCOUNTER — Ambulatory Visit (INDEPENDENT_AMBULATORY_CARE_PROVIDER_SITE_OTHER): Payer: BC Managed Care – PPO | Admitting: Psychology

## 2021-11-04 DIAGNOSIS — F4321 Adjustment disorder with depressed mood: Secondary | ICD-10-CM | POA: Diagnosis not present

## 2021-11-04 DIAGNOSIS — F319 Bipolar disorder, unspecified: Secondary | ICD-10-CM

## 2021-11-04 NOTE — Progress Notes (Signed)
? ? ? ?  [facility] Counselor/Therapist Progress Note ? ?Patient ID: Christina [name], MRN: [id number],   ? ?Date: 11/04/2021 ? ?Time Spent: 9:03am-9:41am ? 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 ? ?Reported Symptoms:  Pt mood stable.  Pt reported self awareness.   ? ?Mental Status Exam: ?Appearance:  NA     ?Behavior: Appropriate  ?Motor: N/a  ?Speech/Language:  Normal Rate  ?Affect: Appropriate  ?Mood: normal  ?Thought process: normal  ?Thought content:   WNL  ?Sensory/Perceptual disturbances:   WNL  ?Orientation: oriented to person, place, time/date, and situation  ?Attention: Good  ?Concentration: Good  ?Memory: WNL  ?Fund of knowledge:  Good  ?Insight:   Good  ?Judgment:  Good  ?Impulse Control: Good  ? ?Risk Assessment: ?Danger to Self:  No ?Self-injurious Behavior: No ?Danger to Others: No ?Duty to Warn:no ?Physical Aggression / Violence:No  ? ?Subjective: Counselor assessed pt current functioning per pt report.  Processed w/pt mood, stressors and positives.  Reflected positives w/ upcoming vacation for her in family.  Explored upcoming birthday, grief and plans.  Explored concerns w/ husband's health.  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   Pt affect wnl.  Pt reported mood continues to be stable.  Pt reported on work stressors and how coping.  Pt discussed her birthday upcoming and usual to spend time w/ her family and parents. Pt reflected on how will be first birthday w/out dad.  Pt reported that her husband is dealing w/ some medical concerns and further testing w/ cardiologist.  Pt has worry w/ his family hx of heart disorders.  Pt discussed positive of family vacation upcoming and awareness of work, family, personal life balance as starts summer session in may. ? ?Interventions: Cognitive Behavioral Therapy, Insight-Oriented, and  supportive and ACT ? ?Diagnosis:Bipolar I disorder ([place]) ? ?Grief ? ?Plan: Pt to f/u w/ cousneling in 2 weeks to assist coping w/ grief and mood stability.   Pt to f/u w/ [name] as scheduled.  ? ?Treatment Plan ?Client Abilities/Strengths  ?supports: family- partner and parents, friends positives: making a plan and sticking to it i had the confidence to pursue promotion for first time.  ?Client Treatment Preferences  ?biweekly counseling and continue medication management [name].  ?Client Statement of Needs  ?pt "continue strategies shared and use when recognize when going into my highs or lows or in between". Continue same- " i just need to maintain." Feb 06, 2021 grief counseling with death of her father.  ?Treatment Level  ?outpatient counseling  ?Symptoms  ?death of father 04/02/2021. History of at least one hypomanic, manic, or mixed mood episode.Marland Kitchen History of chronic or recurrent depression for which the client has taken antidepressant medication,  had outpatient treatment, or had a course of electroconvulsive therapy.  ?Problems Addressed  ?Grief / Loss Unresolved, Bipolar Disorde, Bipolar Disorder  ?Goals ?1. Begin a healthy grieving process around the loss. ?Objective ?Begin verbalizing feelings associated with the loss. ?Target Date: 2022-04-15 Frequency: Daily  ?Progress: 50 Modality: individual  ?Related Interventions ?Assist the client in identifying and expressing feelings connected with his/her loss. ?2. continue to use calming skills to assist coping w/ stress and managing mood ?Objective ?Maintain positive self care, use of mindfulness and other grounding activities to maintain mood stability. ?Target Date: 02-06-22 Frequency: Daily  ?Progress: 70 Modality: individual  ?  Related Interventions ?Assist the client in establishing a routine pattern of daily activities such as sleeping, eating, solitary and social activities, and exercise; use of relaxation and grounding strategies routine and assist in  assessing mood. ?3. Develop healthy cognitive patterns and beliefs about self and the world that lead to alleviation and help prevent the relapse of mood episodes. ?Objective ?Discuss and resolve troubling personal and interpersonal issues. ?Target Date: 2022-01-21 Frequency: Daily  ?Progress: 30 Modality: individual  ?Related Interventions ?Use interpersonal therapy techniques to explore and resolve issues surrounding marital stress, role disputes, role transitions, and conflict skills/boundaries; provide support and strategies for resolving identified interpersonal issues. ?Objective ?Identify and replace thoughts and behaviors that trigger manic or depressive symptoms. ?Target Date: 2022-01-21 Frequency: Daily  ?Progress: 60 Modality: individual  ?Related Interventions ?uitilize CBT strategies to assist in challenging negative thought patterns and beliefs.  ?Client participated in Treatment plan development and provided verbal consent.  ? ? ? ? ? ? ?[name], [facility] ? ? ? ? ? ? ? ? ? ? ? ? ? ? [name], [facility] ?

## 2021-11-17 ENCOUNTER — Telehealth (HOSPITAL_BASED_OUTPATIENT_CLINIC_OR_DEPARTMENT_OTHER): Payer: BC Managed Care – PPO | Admitting: Psychiatry

## 2021-11-17 ENCOUNTER — Encounter (HOSPITAL_COMMUNITY): Payer: Self-pay | Admitting: Psychiatry

## 2021-11-17 DIAGNOSIS — F419 Anxiety disorder, unspecified: Secondary | ICD-10-CM | POA: Diagnosis not present

## 2021-11-17 DIAGNOSIS — F3162 Bipolar disorder, current episode mixed, moderate: Secondary | ICD-10-CM

## 2021-11-17 MED ORDER — ARIPIPRAZOLE 5 MG PO TABS: 5.0000 mg | ORAL_TABLET | Freq: Every evening | ORAL | 0 refills | Status: DC

## 2021-11-17 NOTE — Progress Notes (Signed)
Virtual Visit via Telephone Note ? ?I connected with Christina [name] on 11/17/21 at  2:00 PM EDT by telephone and verified that I am speaking with the correct person using two identifiers. ? ?Location: ?Patient: In car ?Provider: Home Office ?  ?I discussed the limitations, risks, security and privacy concerns of performing an evaluation and management service by telephone and the availability of in person appointments. I also discussed with the patient that there may be a patient responsible charge related to this service. The patient expressed understanding and agreed to proceed. ? ? ?History of Present Illness: ?Patient is evaluated by phone session.  She is in the car coming from [facility].  Patient told that she is anxious as recently her husband diagnosed with CHF.  Today was the appointment.  Patient admitted that has caused some time insomnia and racing thoughts.  She is in therapy with Lerry Paterson.  Other than anxiety and sometimes ruminative thoughts patient denies any paranoia, hallucination, panic attack.  She is not taking Ativan.  She like Abilify which is keeping her mood stable.  She denies any mania or psychosis.  Her appetite is okay.  She started walking every day and lost 7 to 8 pounds.  She assembled her bike and her plan to use the bike every day.  Now husband diagnosed with CHF she is more cautious about diet.  She has no tremor or shakes or any EPS.  Her blood sugar is much better and she has appointment coming up in June.  She like to keep the Abilify current dose. ? ?Past Psychiatric History:  ?H/O depression, anxiety, mania and psychosis.  Admitted in 1997 at [facility] due to suicidal gestures.  Seen in this office since 2006. Tried Prozac and Wellbutrin.   ?  ?Psychiatric Specialty Exam: ?Physical Exam  ?Review of Systems  ?Weight 210 lb (95.3 kg), unknown if currently breastfeeding.There is no height or weight on file to calculate BMI.  ?General Appearance: NA  ?Eye Contact:  NA   ?Speech:  Normal Rate  ?Volume:  Normal  ?Mood:  Anxious  ?Affect:  NA  ?Thought Process:  Goal Directed  ?Orientation:  Full (Time, Place, and Person)  ?Thought Content:  Rumination  ?Suicidal Thoughts:  No  ?Homicidal Thoughts:  No  ?Memory:  Immediate;   Good ?Recent;   Good ?Remote;   Good  ?Judgement:  Intact  ?Insight:  Present  ?Psychomotor Activity:  NA  ?Concentration:  Concentration: Good and Attention Span: Good  ?Recall:  Good  ?Fund of Knowledge:  Good  ?Language:  Good  ?Akathisia:  No  ?Handed:  Right  ?AIMS (if indicated):     ?Assets:  Communication Skills ?Desire for Improvement ?Housing ?Resilience ?Social Support ?Talents/Skills ?Transportation  ?ADL's:  Intact  ?Cognition:  WNL  ?Sleep:   fair 5-6 hrs  ? ? ? ? ?Assessment and Plan: ?Bipolar disorder type I.  Anxiety. ? ?Discussed recent diagnosis of her husband CHF.  She admitted insomnia and racing thoughts.  I recommend to take over-the-counter melatonin to help her sleep as it could be situational.  Reassurance given as husband start taking the medication will get better.  I encouraged to keep appointment with [name].  Continue Abilify 5 mg daily.  Patient is considering taking FMLA to help her husband's doctor's appointments.  I recommended to call us back if she has any questions or any concern.  Follow-up in 3 months. ? ?Follow Up Instructions: ? ?  ?I  discussed the assessment and treatment plan with the patient. The patient was provided an opportunity to ask questions and all were answered. The patient agreed with the plan and demonstrated an understanding of the instructions. ?  ?The patient was advised to call back or seek an in-person evaluation if the symptoms worsen or if the condition fails to improve as anticipated. ? ?Collaboration of Care: Primary Care Provider AEB notes are in epic to review.  ? ?Patient/Guardian was advised Release of Information must be obtained prior to any record release in order to collaborate their  care with an outside provider. Patient/Guardian was advised if they have not already done so to contact the registration department to sign all necessary forms in order for [place] to release information regarding their care.  ? ?Consent: Patient/Guardian gives verbal consent for treatment and assignment of benefits for services provided during this visit. Patient/Guardian expressed understanding and agreed to proceed.   ? ?I provided 18 minutes of non-face-to-face time during this encounter. ? ? ?Kathlee Nations, MD  ?

## 2021-11-19 ENCOUNTER — Ambulatory Visit (INDEPENDENT_AMBULATORY_CARE_PROVIDER_SITE_OTHER): Payer: BC Managed Care – PPO | Admitting: Psychology

## 2021-11-19 DIAGNOSIS — F319 Bipolar disorder, unspecified: Secondary | ICD-10-CM

## 2021-11-19 NOTE — Progress Notes (Signed)
? ? ? ?  [place] Counselor/Therapist Progress Note ? ?Patient ID: Christina [name], MRN: [id number],   ? ?Date: 11/19/2021 ? ?Time Spent: 9:03am-9:46 am ? 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 ? ?Reported Symptoms:  Pt reports stressed w/ husband's health.    ? ?Mental Status Exam: ?Appearance:  NA     ?Behavior: Appropriate  ?Motor: N/a  ?Speech/Language:  Normal Rate  ?Affect: Appropriate  ?Mood: normal  ?Thought process: normal  ?Thought content:   WNL  ?Sensory/Perceptual disturbances:   WNL  ?Orientation: oriented to person, place, time/date, and situation  ?Attention: Good  ?Concentration: Good  ?Memory: WNL  ?Fund of knowledge:  Good  ?Insight:   Good  ?Judgment:  Good  ?Impulse Control: Good  ? ?Risk Assessment: ?Danger to Self:  No ?Self-injurious Behavior: No ?Danger to Others: No ?Duty to Warn:no ?Physical Aggression / Violence:No  ? ?Subjective: Counselor assessed pt current functioning per pt report.  Processed w/pt recent stressors w/ news of husband's health.  Reflected how pt acknowledging stressors, worry, awareness and focused on what can get done.  Discussed importance of balance not taking on more and self care time as well.  Pt affect wnl.  Pt reported that didn't get to go on vacation due to husband's health concerns.  Pt reported that in past 2 weeks husband been to cardiologist and had several procedures and dx w/ heart failure.   She reported cried initially and aware in survival mode now- focused on what she can do and taking care of responsibilities.  Pt reported she is filling for FMLA to be able to assist husband and needs of kids as husband's condition is treated and stabilizes.  Pt noted how her father's health issues started in the same way.  Pt discussed 2 events upcoming that she is still going to for her sorority and given as time for self.  Pt is  acknowledging need for self care and removing other responsibilities.  ? ?Interventions: Cognitive Behavioral Therapy and supportive ? ?Diagnosis:Bipolar I disorder ([place]) ? ?Plan: Pt to f/u w/ cousneling in 1 week to assist coping w/ grief and mood stability.   Pt to f/u w/ [name] as scheduled.  ? ?Treatment Plan ?Client Abilities/Strengths  ?supports: family- partner and parents, friends positives: making a plan and sticking to it i had the confidence to pursue promotion for first time.  ?Client Treatment Preferences  ?biweekly counseling and continue medication management [name].  ?Client Statement of Needs  ?pt "continue strategies shared and use when recognize when going into my highs or lows or in between". Continue same- " i just need to maintain." 2021-02-09 grief counseling with death of her father.  ?Treatment Level  ?outpatient counseling  ?Symptoms  ?death of father April 05, 2021. History of at least one hypomanic, manic, or mixed mood episode.Marland Kitchen History of chronic or recurrent depression for which the client has taken antidepressant medication,  had outpatient treatment, or had a course of electroconvulsive therapy.  ?Problems Addressed  ?Grief / Loss Unresolved, Bipolar Disorde, Bipolar Disorder  ?Goals ?1. Begin a healthy grieving process around the loss. ?Objective ?Begin verbalizing feelings associated with the loss. ?Target Date: 2022-04-15 Frequency: Daily  ?Progress: 50 Modality: individual  ?Related Interventions ?Assist the client in identifying and expressing feelings connected with his/her loss. ?2. continue to use calming skills to assist coping w/ stress and  managing mood ?Objective ?Maintain positive self care, use of mindfulness and other grounding activities to maintain mood stability. ?Target Date: 2022-01-21 Frequency: Daily  ?Progress: 70 Modality: individual  ?Related Interventions ?Assist the client in establishing a routine pattern of daily activities such as sleeping, eating, solitary  and social activities, and exercise; use of relaxation and grounding strategies routine and assist in assessing mood. ?3. Develop healthy cognitive patterns and beliefs about self and the world that lead to alleviation and help prevent the relapse of mood episodes. ?Objective ?Discuss and resolve troubling personal and interpersonal issues. ?Target Date: 2022-01-21 Frequency: Daily  ?Progress: 30 Modality: individual  ?Related Interventions ?Use interpersonal therapy techniques to explore and resolve issues surrounding marital stress, role disputes, role transitions, and conflict skills/boundaries; provide support and strategies for resolving identified interpersonal issues. ?Objective ?Identify and replace thoughts and behaviors that trigger manic or depressive symptoms. ?Target Date: 2022-01-21 Frequency: Daily  ?Progress: 60 Modality: individual  ?Related Interventions ?uitilize CBT strategies to assist in challenging negative thought patterns and beliefs.  ?Client participated in Treatment plan development and provided verbal consent.  ? ? ? ? ? [name], [facility] ?

## 2021-11-26 ENCOUNTER — Ambulatory Visit: Payer: BC Managed Care – PPO | Admitting: Psychology

## 2021-11-27 ENCOUNTER — Ambulatory Visit (INDEPENDENT_AMBULATORY_CARE_PROVIDER_SITE_OTHER): Payer: BC Managed Care – PPO | Admitting: Psychology

## 2021-11-27 DIAGNOSIS — F319 Bipolar disorder, unspecified: Secondary | ICD-10-CM

## 2021-11-27 NOTE — Progress Notes (Signed)
? ? ? ?  [place] Counselor/Therapist Progress Note ? ?Patient ID: Christina [name], MRN: [id number],   ? ?Date: 11/27/2021 ? ?Time Spent: 12:00pm-12:48pm ? 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 ? ?Reported Symptoms:  Pt reports stressed w/ husband's health, feeling overwhelmed   ? ?Mental Status Exam: ?Appearance:  NA     ?Behavior: Appropriate  ?Motor: N/a  ?Speech/Language:  Normal Rate  ?Affect: Appropriate  ?Mood: anxious  ?Thought process: normal  ?Thought content:   WNL  ?Sensory/Perceptual disturbances:   WNL  ?Orientation: oriented to person, place, time/date, and situation  ?Attention: Good  ?Concentration: Good  ?Memory: WNL  ?Fund of knowledge:  Good  ?Insight:   Good  ?Judgment:  Good  ?Impulse Control: Good  ? ?Risk Assessment: ?Danger to Self:  No ?Self-injurious Behavior: No ?Danger to Others: No ?Duty to Warn:no ?Physical Aggression / Violence:No  ? ?Subjective: Counselor assessed pt current functioning per pt report.  Processed w/pt stressors w/ husband's health, family needs and taking intermittent FMLA.  Reflected and validated pt feeling overwhelmed and focus on ways to manage.  Explored w/pt starting summer school and giving space to evaluate decision about continuing or leave of absence from school.  Pt affect wnl.  Pt reported that she has started w/ intermittent FMLA at work to attend to family needs- working 11-3 M, W, F and 8-3 Tu/Th.  Pt reported that not feeling in crisis but overwhelmed w/ figuring out the schedule and getting things done.  Pt discussed that in 2 weeks will be starting summer session.  Pt identified that summer school puts more on her and is a time commitment that recognized can't do currently.  Pt wants to complete school and not have hanging over her.  Pt decided that best for leave of absence and to give self grace about this.  Pt insight that  this has been weighing on her but hasn't been able to verbalize to anyone and good to do so today.   ? ?Interventions: Cognitive Behavioral Therapy and supportive ? ?Diagnosis:Bipolar I disorder ([place]) ? ?Plan: Pt to f/u w/ cousneling in 1 week to assist coping w/ grief and mood stability.   Pt to f/u w/ [name] as scheduled.  ? ?Treatment Plan ?Client Abilities/Strengths  ?supports: family- partner and parents, friends positives: making a plan and sticking to it i had the confidence to pursue promotion for first time.  ?Client Treatment Preferences  ?biweekly counseling and continue medication management [name].  ?Client Statement of Needs  ?pt "continue strategies shared and use when recognize when going into my highs or lows or in between". Continue same- " i just need to maintain." Jan 30, 2021 grief counseling with death of her father.  ?Treatment Level  ?outpatient counseling  ?Symptoms  ?death of father 03/26/21. History of at least one hypomanic, manic, or mixed mood episode.Marland Kitchen History of chronic or recurrent depression for which the client has taken antidepressant medication,  had outpatient treatment, or had a course of electroconvulsive therapy.  ?Problems Addressed  ?Grief / Loss Unresolved, Bipolar Disorde, Bipolar Disorder  ?Goals ?1. Begin a healthy grieving process around the loss. ?Objective ?Begin verbalizing feelings associated with the loss. ?Target Date: 2022-04-15 Frequency: Daily  ?Progress: 50 Modality: individual  ?Related Interventions ?Assist the client in identifying and expressing feelings connected with his/her loss. ?2. continue to use calming skills to  assist coping w/ stress and managing mood ?Objective ?Maintain positive self care, use of mindfulness and other grounding activities to maintain mood stability. ?Target Date: 2022-01-21 Frequency: Daily  ?Progress: 70 Modality: individual  ?Related Interventions ?Assist the client in establishing a routine pattern of daily activities such  as sleeping, eating, solitary and social activities, and exercise; use of relaxation and grounding strategies routine and assist in assessing mood. ?3. Develop healthy cognitive patterns and beliefs about self and the world that lead to alleviation and help prevent the relapse of mood episodes. ?Objective ?Discuss and resolve troubling personal and interpersonal issues. ?Target Date: 2022-01-21 Frequency: Daily  ?Progress: 30 Modality: individual  ?Related Interventions ?Use interpersonal therapy techniques to explore and resolve issues surrounding marital stress, role disputes, role transitions, and conflict skills/boundaries; provide support and strategies for resolving identified interpersonal issues. ?Objective ?Identify and replace thoughts and behaviors that trigger manic or depressive symptoms. ?Target Date: 2022-01-21 Frequency: Daily  ?Progress: 60 Modality: individual  ?Related Interventions ?uitilize CBT strategies to assist in challenging negative thought patterns and beliefs.  ?Client participated in Treatment plan development and provided verbal consent.  ? ? ? ? ? ? [name], [facility] ?

## 2021-12-01 DIAGNOSIS — E119 Type 2 diabetes mellitus without complications: Secondary | ICD-10-CM | POA: Diagnosis not present

## 2021-12-02 ENCOUNTER — Ambulatory Visit: Payer: BC Managed Care – PPO | Admitting: Psychology

## 2021-12-04 ENCOUNTER — Ambulatory Visit (INDEPENDENT_AMBULATORY_CARE_PROVIDER_SITE_OTHER): Payer: BC Managed Care – PPO | Admitting: Psychology

## 2021-12-04 DIAGNOSIS — F4321 Adjustment disorder with depressed mood: Secondary | ICD-10-CM

## 2021-12-04 DIAGNOSIS — F319 Bipolar disorder, unspecified: Secondary | ICD-10-CM

## 2021-12-04 NOTE — Progress Notes (Signed)
? ?  [place] Counselor/Therapist Progress Note ? ?Patient ID: Christina [name], MRN: [id number],   ? ?Date: 12/04/2021 ? ?Time Spent: 12:01pm-12:48pm ? ?Treatment Type: Individual Therapy  Pt is seen for a virtual audio visit via caregility. 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 ? ?Reported Symptoms:  Pt reports stressed w/ husband's health, feeling overwhelmed, grief w/ her birthday, dad's headstone being placed and son's milestone.   ? ?Mental Status Exam: ?Appearance:  NA     ?Behavior: Appropriate  ?Motor: N/a  ?Speech/Language:  Normal Rate  ?Affect: Appropriate  ?Mood: sad  ?Thought process: normal  ?Thought content:   WNL  ?Sensory/Perceptual disturbances:   WNL  ?Orientation: oriented to person, place, time/date, and situation  ?Attention: Good  ?Concentration: Good  ?Memory: WNL  ?Fund of knowledge:  Good  ?Insight:   Good  ?Judgment:  Good  ?Impulse Control: Good  ? ?Risk Assessment: ?Danger to Self:  No ?Self-injurious Behavior: No ?Danger to Others: No ?Duty to Warn:no ?Physical Aggression / Violence:No  ? ?Subjective: Counselor assessed pt current functioning per pt report.  Processed w/pt grief experienced this week.  Discussed stressors w/ husband's health, family needs and summer school decision.  Reflected and validated pt awareness of feeling overwhelmed and evaluating what she can and cannot do.   Pt affect wnl.  Pt expressed that has been emotional week w/ grief. On her birthday she learned that her father's Christina [name] was placed, visited w/ her mom and then her son after he received his learner's permit.  Pt discussed how that was emotional for them both.  Pt reported that she still feels that w/ summer school too much, but husband wants her to still go.  Pt discussed how this is coming as support from him.  Pt discussed recognizing what needs for self.   ? ?Interventions: Cognitive Behavioral Therapy and supportive ? ?Diagnosis:Bipolar I disorder  ([place]) ? ?Grief ? ?Plan: Pt to f/u w/ cousneling in 1 week to assist coping w/ grief and mood stability.   Pt to f/u w/ [name] as scheduled.  ? ?Treatment Plan ?Client Abilities/Strengths  ?supports: family- partner and parents, friends positives: making a plan and sticking to it i had the confidence to pursue promotion for first time.  ?Client Treatment Preferences  ?biweekly counseling and continue medication management [name].  ?Client Statement of Needs  ?pt "continue strategies shared and use when recognize when going into my highs or lows or in between". Continue same- " i just need to maintain." 02-09-21 grief counseling with death of her father.  ?Treatment Level  ?outpatient counseling  ?Symptoms  ?death of father 04-05-21. History of at least one hypomanic, manic, or mixed mood episode.Marland Kitchen History of chronic or recurrent depression for which the client has taken antidepressant medication,  had outpatient treatment, or had a course of electroconvulsive therapy.  ?Problems Addressed  ?Grief / Loss Unresolved, Bipolar Disorde, Bipolar Disorder  ?Goals ?1. Begin a healthy grieving process around the loss. ?Objective ?Begin verbalizing feelings associated with the loss. ?Target Date: 2022-04-15 Frequency: Daily  ?Progress: 50 Modality: individual  ?Related Interventions ?Assist the client in identifying and expressing feelings connected with his/her loss. ?2. continue to use calming skills to assist coping w/ stress and managing mood ?Objective ?Maintain positive self care, use of mindfulness and other grounding activities to maintain mood stability. ?Target Date: 2022-02-09 Frequency: Daily  ?Progress: 70 Modality: individual  ?Related Interventions ?Assist the client in establishing  a routine pattern of daily activities such as sleeping, eating, solitary and social activities, and exercise; use of relaxation and grounding strategies routine and assist in assessing mood. ?3. Develop healthy cognitive  patterns and beliefs about self and the world that lead to alleviation and help prevent the relapse of mood episodes. ?Objective ?Discuss and resolve troubling personal and interpersonal issues. ?Target Date: 2022-01-21 Frequency: Daily  ?Progress: 30 Modality: individual  ?Related Interventions ?Use interpersonal therapy techniques to explore and resolve issues surrounding marital stress, role disputes, role transitions, and conflict skills/boundaries; provide support and strategies for resolving identified interpersonal issues. ?Objective ?Identify and replace thoughts and behaviors that trigger manic or depressive symptoms. ?Target Date: 2022-01-21 Frequency: Daily  ?Progress: 60 Modality: individual  ?Related Interventions ?uitilize CBT strategies to assist in challenging negative thought patterns and beliefs.  ?Client participated in Treatment plan development and provided verbal consent.  ? ? ? Christina [name], [facility] ?

## 2021-12-09 ENCOUNTER — Ambulatory Visit (INDEPENDENT_AMBULATORY_CARE_PROVIDER_SITE_OTHER): Payer: BC Managed Care – PPO | Admitting: Psychology

## 2021-12-09 ENCOUNTER — Other Ambulatory Visit: Payer: Self-pay

## 2021-12-09 DIAGNOSIS — F319 Bipolar disorder, unspecified: Secondary | ICD-10-CM | POA: Diagnosis not present

## 2021-12-09 DIAGNOSIS — F4321 Adjustment disorder with depressed mood: Secondary | ICD-10-CM

## 2021-12-09 MED ORDER — LOSARTAN POTASSIUM 50 MG PO TABS: 50.0000 mg | ORAL_TABLET | Freq: Every day | ORAL | 1 refills | Status: DC

## 2021-12-09 NOTE — Progress Notes (Signed)
? ?  [place] Counselor/Therapist Progress Note ? ?Patient ID: Christina [name], MRN: [id number],   ? ?Date: 12/09/2021 ? ?Time Spent: 1:37pm-2:20pm ? ?Treatment Type: Individual Therapy  Pt is seen for a virtual audio visit via phone.  Pt Internet connection didn't work for video.  pt joins from her home and counselor from her home office. ? ?Reported Symptoms:  Pt reports stressed w/ husband's health, feeling overwhelmed. ?Mental Status Exam: ?Appearance:  NA     ?Behavior: Appropriate  ?Motor: N/a  ?Speech/Language:  Normal Rate  ?Affect: Appropriate  ?Mood: overwhelmed  ?Thought process: normal  ?Thought content:   WNL  ?Sensory/Perceptual disturbances:   WNL  ?Orientation: oriented to person, place, time/date, and situation  ?Attention: Good  ?Concentration: Good  ?Memory: WNL  ?Fund of knowledge:  Good  ?Insight:   Good  ?Judgment:  Good  ?Impulse Control: Good  ? ?Risk Assessment: ?Danger to Self:  No ?Self-injurious Behavior: No ?Danger to Others: No ?Duty to Warn:no ?Physical Aggression / Violence:No  ? ?Subjective: Counselor assessed pt current functioning per pt report.  Processed w/pt stressors, increased responsibilities and mood.  Validated and normalized emotions and reflected positive of recognizing limits.  Discussed reality of changes given husband's health.  Pt affect wnl.  Pt expressed feeling overwhelmed and recognizing reality and what is possible for her and not.  Pt reported that IEP meeting for son went well.  Pt reported that husband will start on supplemental oxygen.  Pt recognized the toll of transportation for son's program and working full time and attending to other family needs.  Pt discussed how still hasn't contacted school re: summer session.  Pt recognized her own self care is being impacted.  Pt identified what things not possible, has to change/shift and what has to say no to.     ? ?Interventions: Cognitive Behavioral Therapy and supportive ? ?Diagnosis:Bipolar I  disorder ([place]) ? ?Grief ? ?Plan: Pt to f/u w/ cousneling in 1 week to assist coping w/ grief and mood stability.   Pt to f/u w/ [name] as scheduled.  ? ?Treatment Plan ?Client Abilities/Strengths  ?supports: family- partner and parents, friends positives: making a plan and sticking to it i had the confidence to pursue promotion for first time.  ?Client Treatment Preferences  ?biweekly counseling and continue medication management [name].  ?Client Statement of Needs  ?pt "continue strategies shared and use when recognize when going into my highs or lows or in between". Continue same- " i just need to maintain." February 07, 2021 grief counseling with death of her father.  ?Treatment Level  ?outpatient counseling  ?Symptoms  ?death of father 04-03-2021. History of at least one hypomanic, manic, or mixed mood episode.Marland Kitchen History of chronic or recurrent depression for which the client has taken antidepressant medication,  had outpatient treatment, or had a course of electroconvulsive therapy.  ?Problems Addressed  ?Grief / Loss Unresolved, Bipolar Disorde, Bipolar Disorder  ?Goals ?1. Begin a healthy grieving process around the loss. ?Objective ?Begin verbalizing feelings associated with the loss. ?Target Date: 2022-04-15 Frequency: Daily  ?Progress: 50 Modality: individual  ?Related Interventions ?Assist the client in identifying and expressing feelings connected with his/her loss. ?2. continue to use calming skills to assist coping w/ stress and managing mood ?Objective ?Maintain positive self care, use of mindfulness and other grounding activities to maintain mood stability. ?Target Date: 2022-02-07 Frequency: Daily  ?Progress: 70 Modality: individual  ?Related Interventions ?Assist the client in establishing a routine pattern of  daily activities such as sleeping, eating, solitary and social activities, and exercise; use of relaxation and grounding strategies routine and assist in assessing mood. ?3. Develop healthy  cognitive patterns and beliefs about self and the world that lead to alleviation and help prevent the relapse of mood episodes. ?Objective ?Discuss and resolve troubling personal and interpersonal issues. ?Target Date: 2022-01-21 Frequency: Daily  ?Progress: 30 Modality: individual  ?Related Interventions ?Use interpersonal therapy techniques to explore and resolve issues surrounding marital stress, role disputes, role transitions, and conflict skills/boundaries; provide support and strategies for resolving identified interpersonal issues. ?Objective ?Identify and replace thoughts and behaviors that trigger manic or depressive symptoms. ?Target Date: 2022-01-21 Frequency: Daily  ?Progress: 60 Modality: individual  ?Related Interventions ?uitilize CBT strategies to assist in challenging negative thought patterns and beliefs.  ?Client participated in Treatment plan development and provided verbal consent.  ? ? ? ?[name], [facility] ? ? ? ? ? ? ? ? ? ? ? ? ? ? [name], [facility] ?

## 2021-12-15 DIAGNOSIS — E875 Hyperkalemia: Secondary | ICD-10-CM | POA: Diagnosis not present

## 2021-12-17 ENCOUNTER — Ambulatory Visit: Payer: BC Managed Care – PPO | Admitting: Family Medicine

## 2021-12-18 ENCOUNTER — Ambulatory Visit (INDEPENDENT_AMBULATORY_CARE_PROVIDER_SITE_OTHER): Payer: BC Managed Care – PPO | Admitting: Psychology

## 2021-12-18 DIAGNOSIS — F319 Bipolar disorder, unspecified: Secondary | ICD-10-CM

## 2021-12-18 DIAGNOSIS — F4321 Adjustment disorder with depressed mood: Secondary | ICD-10-CM | POA: Diagnosis not present

## 2021-12-18 NOTE — Progress Notes (Signed)
[place] Counselor/Therapist Progress Note  Patient ID: Christina [name], MRN: [id number],    Date: 12/18/2021  Time Spent: 99:[id number]-83:[id number]  Treatment Type: Individual Therapy  Pt is seen for a virtual video visit via caregility.   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  Reported Symptoms:  Pt reports stressed w/ husband's health, feeling overwhelmed. Mental Status Exam: Appearance:  NA     Behavior: Appropriate  Motor: Normal  Speech/Language:  Normal Rate  Affect: Appropriate  Mood: overwhelmed  Thought process: normal  Thought content:   WNL  Sensory/Perceptual disturbances:   WNL  Orientation: oriented to person, place, time/date, and situation  Attention: Good  Concentration: Good  Memory: WNL  Fund of knowledge:  Good  Insight:   Good  Judgment:  Good  Impulse Control: Good   Risk Assessment: Danger to Self:  No Self-injurious Behavior: No Danger to Others: No Duty to Warn:no Physical Aggression / Violence:No   Subjective: Counselor assessed pt current functioning per pt report.  Processed w/pt stressors and feeling overwhelmed.  Explored pt decisions re: school, work, family, self care balance.  Reiterated self compassion and not holding to unrealistic expectations.  Pt affect wnl.  Pt expressed feeling overwhelmed w/ this week.  Pt reported husband sent to ED by PCP following his appointment.  Pt reported she is giving self grace on things and aware that can't do everything.  Pt reported she did drop one class and having to decide about another.  Pt reports good support and aware of needing to attend to her own self care.  Interventions: Cognitive Behavioral Therapy and supportive  Diagnosis:Bipolar I disorder ([place])  Grief  Plan: Pt to f/u w/ cousneling in 1 week to assist coping w/ grief and mood stability.   Pt to f/u w/ [name] as scheduled.   Treatment Plan Client Abilities/Strengths  supports: family-  partner and parents, friends positives: making a plan and sticking to it i had the confidence to pursue promotion for first time.  Client Treatment Preferences  biweekly counseling and continue medication management [name].  Client Statement of Needs  pt "continue strategies shared and use when recognize when going into my highs or lows or in between". Continue same- " i just need to maintain." 02/17/2021 grief counseling with death of her father.  Treatment Level  outpatient counseling  Symptoms  death of father 13-Apr-2021. History of at least one hypomanic, manic, or mixed mood episode.Marland Kitchen History of chronic or recurrent depression for which the client has taken antidepressant medication,  had outpatient treatment, or had a course of electroconvulsive therapy.  Problems Addressed  Grief / Loss Unresolved, Bipolar Disorde, Bipolar Disorder  Goals 1. Begin a healthy grieving process around the loss. Objective Begin verbalizing feelings associated with the loss. Target Date: 2022-04-15 Frequency: Daily  Progress: 50 Modality: individual  Related Interventions Assist the client in identifying and expressing feelings connected with his/her loss. 2. continue to use calming skills to assist coping w/ stress and managing mood Objective Maintain positive self care, use of mindfulness and other grounding activities to maintain mood stability. Target Date: 02-17-2022 Frequency: Daily  Progress: 70 Modality: individual  Related Interventions Assist the client in establishing a routine pattern of daily activities such as sleeping, eating, solitary and social activities, and exercise; use of relaxation and grounding strategies routine and assist in assessing mood. 3. Develop healthy cognitive patterns and beliefs about self and the world that  lead to alleviation and help prevent the relapse of mood episodes. Objective Discuss and resolve troubling personal and interpersonal issues. Target Date:  2022-01-21 Frequency: Daily  Progress: 30 Modality: individual  Related Interventions Use interpersonal therapy techniques to explore and resolve issues surrounding marital stress, role disputes, role transitions, and conflict skills/boundaries; provide support and strategies for resolving identified interpersonal issues. Objective Identify and replace thoughts and behaviors that trigger manic or depressive symptoms. Target Date: 2022-01-21 Frequency: Daily  Progress: 60 Modality: individual  Related Interventions uitilize CBT strategies to assist in challenging negative thought patterns and beliefs.  Client participated in Treatment plan development and provided verbal consent.    [name], [facility]

## 2021-12-23 ENCOUNTER — Ambulatory Visit (INDEPENDENT_AMBULATORY_CARE_PROVIDER_SITE_OTHER): Payer: BC Managed Care – PPO | Admitting: Family Medicine

## 2021-12-23 ENCOUNTER — Encounter: Payer: Self-pay | Admitting: Family Medicine

## 2021-12-23 VITALS — BP 116/81 | HR 92 | Ht 64.0 in | Wt 208.1 lb

## 2021-12-23 DIAGNOSIS — I10 Essential (primary) hypertension: Secondary | ICD-10-CM | POA: Diagnosis not present

## 2021-12-23 DIAGNOSIS — Z6379 Other stressful life events affecting family and household: Secondary | ICD-10-CM | POA: Diagnosis not present

## 2021-12-23 DIAGNOSIS — F3162 Bipolar disorder, current episode mixed, moderate: Secondary | ICD-10-CM

## 2021-12-23 DIAGNOSIS — Z1231 Encounter for screening mammogram for malignant neoplasm of breast: Secondary | ICD-10-CM

## 2021-12-23 DIAGNOSIS — Z794 Long term (current) use of insulin: Secondary | ICD-10-CM

## 2021-12-23 DIAGNOSIS — E1165 Type 2 diabetes mellitus with hyperglycemia: Secondary | ICD-10-CM

## 2021-12-23 NOTE — Patient Instructions (Addendum)
F/U  in 4 months, call if you need me sooner  Labs ordered in January today  No medication changes  Please schedule mobile mammogram due in Sept at checkout, mobile unit  Thanks for choosing [facility], we consider it a privelige to serve you.

## 2021-12-24 LAB — CBC
Hematocrit: 36.3 % (ref 34.0–46.6)
Hemoglobin: 11.8 g/dL (ref 11.1–15.9)
MCH: 29.4 pg (ref 26.6–33.0)
MCHC: 32.5 g/dL (ref 31.5–35.7)
MCV: 90 fL (ref 79–97)
Platelets: 335 10*3/uL (ref 150–450)
RBC: 4.02 x10E6/uL (ref 3.77–5.28)
RDW: 13.2 % (ref 11.7–15.4)
WBC: 4.3 10*3/uL (ref 3.4–10.8)

## 2021-12-24 LAB — BMP8+EGFR
BUN/Creatinine Ratio: 9 (ref 9–23)
BUN: 7 mg/dL (ref 6–24)
CO2: 23 mmol/L (ref 20–29)
Calcium: 9.2 mg/dL (ref 8.7–10.2)
Chloride: 102 mmol/L (ref 96–106)
Creatinine, Ser: 0.78 mg/dL (ref 0.57–1.00)
Glucose: 132 mg/dL — ABNORMAL HIGH (ref 70–99)
Potassium: 4.1 mmol/L (ref 3.5–5.2)
Sodium: 140 mmol/L (ref 134–144)
eGFR: 95 mL/min/{1.73_m2} (ref >59)

## 2021-12-24 LAB — HEMOGLOBIN A1C
Est. average glucose Bld gHb Est-mCnc: 157 mg/dL
Hgb A1c MFr Bld: 7.1 % — ABNORMAL HIGH (ref 4.8–5.6)

## 2021-12-25 ENCOUNTER — Ambulatory Visit: Payer: BC Managed Care – PPO | Admitting: Psychology

## 2021-12-29 ENCOUNTER — Encounter: Payer: Self-pay | Admitting: Family Medicine

## 2021-12-29 DIAGNOSIS — Z6379 Other stressful life events affecting family and household: Secondary | ICD-10-CM | POA: Insufficient documentation

## 2021-12-29 NOTE — Assessment & Plan Note (Addendum)
Managing as well as one could expect, leaning heavily on her therapist which is absolutely to her benefit, also has support of family and friends.

## 2021-12-29 NOTE — Assessment & Plan Note (Signed)
Controlled, no change in medication DASH diet and commitment to daily physical activity for a minimum of 30 minutes discussed and encouraged, as a part of hypertension management. The importance of attaining a healthy weight is also discussed.     12/23/2021    9:08 AM 11/17/2021    2:02 PM 10/13/2021    9:40 AM 08/20/2021    9:03 AM 08/18/2021    9:43 AM 05/29/2021    2:58 PM 03/26/2021    9:38 AM  BP/Weight  Systolic BP 704  888 916   945  Diastolic BP 81  66 78   75  Wt. (Lbs) 208.12  218.2 216.12   226.12  BMI 35.72 kg/m2  36.31 kg/m2 35.96 kg/m2   38.21 kg/m2     Information is confidential and restricted. Go to Review Flowsheets to unlock data.

## 2021-12-29 NOTE — Assessment & Plan Note (Signed)
Mrked improvem,ent  Patient re-educated about  the importance of commitment to a  minimum of 150 minutes of exercise per week as able.  The importance of healthy food choices with portion control discussed, as well as eating regularly and within a 12 hour window most days. The need to choose "clean , green" food 50 to 75% of the time is discussed, as well as to make water the primary drink and set a goal of 64 ounces water daily.       12/23/2021    9:08 AM 11/17/2021    2:02 PM 10/13/2021    9:40 AM  Weight /BMI  Weight 208 lb 1.9 oz  218 lb 3.2 oz  Height '5\' 4"'$  (1.626 m)  '5\' 5"'$  (1.651 m)  BMI 35.72 kg/m2  36.31 kg/m2     Information is confidential and restricted. Go to Review Flowsheets to unlock data.

## 2021-12-29 NOTE — Assessment & Plan Note (Signed)
Stable and controlled, managed by Psych 

## 2021-12-29 NOTE — Assessment & Plan Note (Signed)
Christina [name] is reminded of the importance of commitment to daily physical activity for 30 minutes or more, as able and the need to limit carbohydrate intake to 30 to 60 grams per meal to help with blood sugar control.   The need to take medication as prescribed, test blood sugar as directed, and to call between visits if there is a concern that blood sugar is uncontrolled is also discussed.   Christina [name] is reminded of the importance of daily foot exam, annual eye examination, and good blood sugar, blood pressure and cholesterol control. Slightly less well controlled, managed by Endo at [facility] and has upcoming appt     Latest Ref Rng & Units 12/23/2021   10:10 AM 08/19/2021    8:31 AM 03/26/2021   10:30 AM 12/11/2020    8:37 AM 09/18/2020    7:54 AM  Diabetic Labs  HbA1c 4.8 - 5.6 % 7.1   6.9   7.8      Micro/Creat Ratio 0 - 29 mg/g creat  25       Chol 100 - 199 mg/dL  106    112   122    HDL >39 mg/dL  57    71   63    Calc LDL 0 - 99 mg/dL  32    25   43    Triglycerides 0 - 149 mg/dL  88    78   85    Creatinine 0.57 - 1.00 mg/dL 0.78   0.78    0.82   0.82        12/23/2021    9:08 AM 11/17/2021    2:02 PM 10/13/2021    9:40 AM 08/20/2021    9:03 AM 08/18/2021    9:43 AM 05/29/2021    2:58 PM 03/26/2021    9:38 AM  BP/Weight  Systolic BP 798  921 194   174  Diastolic BP 81  66 78   75  Wt. (Lbs) 208.12  218.2 216.12   226.12  BMI 35.72 kg/m2  36.31 kg/m2 35.96 kg/m2   38.21 kg/m2     Information is confidential and restricted. Go to Review Flowsheets to unlock data.      03/26/2021    9:20 AM 05/07/2020   10:00 AM  Foot/eye exam completion dates  Foot Form Completion Done Done

## 2021-12-29 NOTE — Progress Notes (Signed)
Christina [name]     MRN: [id number]      DOB: Apr 01, 1976   HPI Christina [name] is here for follow up and re-evaluation of chronic medical conditions, medication management and review of any available recent lab and radiology data.  Preventive health is updated, specifically  Cancer screening and Immunization.   Questions or concerns regarding consultations or procedures which the PT has had in the interim are  addressed. The PT denies any adverse reactions to current medications since the last visit.  A lot of physical and mental stress in past 12  months, lost her father and in past 2 months, spouse diagnosed with heart failure and uncontrolled diabetes. Has been dealing with autistic son, 3 days / week had to go to [facility], but sees progress in his development , which is rewarding. Withdrew from class as unable to cope currently, work is going well Has weekly therapy sessions and is treated by Psych, both have been a tremendous help Denies polyuria, polydipsia, blurred vision , or hypoglycemic episodes.   ROS Denies recent fever or chills. Denies sinus pressure, nasal congestion, ear pain or sore throat. Denies chest congestion, productive cough or wheezing. Denies chest pains, palpitations and leg swelling Denies abdominal pain, nausea, vomiting,diarrhea or constipation.   Denies dysuria, frequency, hesitancy or incontinence. Denies joint pain, swelling and limitation in mobility. Denies headaches, seizures, numbness, or tingling.  PE  BP 116/81   Pulse 92   Ht '5\' 4"'$  (1.626 m)   Wt 208 lb 1.9 oz (94.4 kg)   SpO2 98%   BMI 35.72 kg/m   Patient alert and oriented and in no cardiopulmonary distress.  HEENT: No facial asymmetry, EOMI,     Neck supple .  Chest: Clear to auscultation bilaterally.  CVS: S1, S2 no murmurs, no S3.Regular rate.  ABD: Soft non tender.   Ext: No edema  MS: Adequate ROM spine, shoulders, hips and knees.  Skin: Intact, no ulcerations or rash  noted.  Psych: Good eye contact, normal affect. Memory intact not anxious or depressed appearing.  CNS: CN 2-12 intact, power,  normal throughout.no focal deficits noted.   Assessment & Plan Essential hypertension, benign Controlled, no change in medication DASH diet and commitment to daily physical activity for a minimum of 30 minutes discussed and encouraged, as a part of hypertension management. The importance of attaining a healthy weight is also discussed.     12/23/2021    9:08 AM 11/17/2021    2:02 PM 10/13/2021    9:40 AM 08/20/2021    9:03 AM 08/18/2021    9:43 AM 05/29/2021    2:58 PM 03/26/2021    9:38 AM  BP/Weight  Systolic BP 409  811 914   782  Diastolic BP 81  66 78   75  Wt. (Lbs) 208.12  218.2 216.12   226.12  BMI 35.72 kg/m2  36.31 kg/m2 35.96 kg/m2   38.21 kg/m2     Information is confidential and restricted. Go to Review Flowsheets to unlock data.       Diabetes mellitus ([place]) Christina [name] is reminded of the importance of commitment to daily physical activity for 30 minutes or more, as able and the need to limit carbohydrate intake to 30 to 60 grams per meal to help with blood sugar control.   The need to take medication as prescribed, test blood sugar as directed, and to call between visits if there is a concern that blood sugar is uncontrolled is  also discussed.   Christina [name] is reminded of the importance of daily foot exam, annual eye examination, and good blood sugar, blood pressure and cholesterol control. Slightly less well controlled, managed by Endo at [facility] and has upcoming appt     Latest Ref Rng & Units 12/23/2021   10:10 AM 08/19/2021    8:31 AM 03/26/2021   10:30 AM 12/11/2020    8:37 AM 09/18/2020    7:54 AM  Diabetic Labs  HbA1c 4.8 - 5.6 % 7.1   6.9   7.8      Micro/Creat Ratio 0 - 29 mg/g creat  25       Chol 100 - 199 mg/dL  106    112   122    HDL >39 mg/dL  57    71   63    Calc LDL 0 - 99 mg/dL  32    25   43    Triglycerides 0 - 149  mg/dL  88    78   85    Creatinine 0.57 - 1.00 mg/dL 0.78   0.78    0.82   0.82        12/23/2021    9:08 AM 11/17/2021    2:02 PM 10/13/2021    9:40 AM 08/20/2021    9:03 AM 08/18/2021    9:43 AM 05/29/2021    2:58 PM 03/26/2021    9:38 AM  BP/Weight  Systolic BP 852  778 242   353  Diastolic BP 81  66 78   75  Wt. (Lbs) 208.12  218.2 216.12   226.12  BMI 35.72 kg/m2  36.31 kg/m2 35.96 kg/m2   38.21 kg/m2     Information is confidential and restricted. Go to Review Flowsheets to unlock data.      03/26/2021    9:20 AM 05/07/2020   10:00 AM  Foot/eye exam completion dates  Foot Form Completion Done Done        Bipolar 1 disorder, mixed, moderate ([place]) Stable and controlled, managed by Psych  Morbid obesity ([place]) Mrked improvem,ent  Patient re-educated about  the importance of commitment to a  minimum of 150 minutes of exercise per week as able.  The importance of healthy food choices with portion control discussed, as well as eating regularly and within a 12 hour window most days. The need to choose "clean , green" food 50 to 75% of the time is discussed, as well as to make water the primary drink and set a goal of 64 ounces water daily.       12/23/2021    9:08 AM 11/17/2021    2:02 PM 10/13/2021    9:40 AM  Weight /BMI  Weight 208 lb 1.9 oz  218 lb 3.2 oz  Height '5\' 4"'$  (1.626 m)  '5\' 5"'$  (1.651 m)  BMI 35.72 kg/m2  36.31 kg/m2     Information is confidential and restricted. Go to Review Flowsheets to unlock data.      Stress due to illness of family member Managing as well as one could expect, leaning heavily on her therapist which is absolutely to her benefit, also has support of family and friends.

## 2021-12-30 ENCOUNTER — Encounter: Payer: Self-pay | Admitting: Family Medicine

## 2021-12-31 NOTE — Telephone Encounter (Signed)
Patient was calling to follow up on mychart message that was sent yesterday.

## 2022-01-01 ENCOUNTER — Ambulatory Visit (INDEPENDENT_AMBULATORY_CARE_PROVIDER_SITE_OTHER): Payer: BC Managed Care – PPO | Admitting: Psychology

## 2022-01-01 ENCOUNTER — Ambulatory Visit: Payer: BC Managed Care – PPO | Admitting: Family Medicine

## 2022-01-01 DIAGNOSIS — F319 Bipolar disorder, unspecified: Secondary | ICD-10-CM

## 2022-01-01 DIAGNOSIS — E119 Type 2 diabetes mellitus without complications: Secondary | ICD-10-CM | POA: Diagnosis not present

## 2022-01-01 NOTE — Progress Notes (Signed)
[facility] Counselor/Therapist Progress Note  Patient ID: Christina [name], MRN: [id number],    Date: 01/01/2022  Time Spent: 10:02am-10:50am  Treatment Type: Individual Therapy  Pt is seen for a virtual video visit via caregility.   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  Reported Symptoms:  Pt reports continued stressed w/ husband's health, and recent health concern for self Mental Status Exam: Appearance:  NA     Behavior: Appropriate  Motor: Normal  Speech/Language:  Normal Rate  Affect: Appropriate  Mood: anxious  Thought process: normal  Thought content:   WNL  Sensory/Perceptual disturbances:   WNL  Orientation: oriented to person, place, time/date, and situation  Attention: Good  Concentration: Good  Memory: WNL  Fund of knowledge:  Good  Insight:   Good  Judgment:  Good  Impulse Control: Good   Risk Assessment: Danger to Self:  No Self-injurious Behavior: No Danger to Others: No Duty to Warn:no Physical Aggression / Violence:No   Subjective: Counselor assessed pt current functioning per pt report.  Processed w/pt stressors and anxiety.  Discussed stress of her own health concern and unknown dx current.  Reflected avoidance and acknowledging survival mode.   Pt affect wnl. Pt some avoidance that she was able to acknowledge as part of survival to focus on what is present.  Pt reported that husband started procedure to drain fluid once a week and acknowledges that he is not stable.  Pt discussed that she did withdraw from class and feels good about.  Pt reported that she experienced her own medical concern w/ leaking from breast and consult scheduled next week.  Pt acknowledged hx of negative relationship w/ breast from teen years and reduction surgery, through struggles w/ breast feeding and now concern for potential cancer.  Interventions: Cognitive Behavioral Therapy and supportive  Diagnosis:Bipolar I disorder  ([place])  Plan: Pt to f/u w/ cousneling in 1-2 week to assist coping w/ grief and mood stability.   Pt to f/u w/ [name] as scheduled.   Treatment Plan Client Abilities/Strengths  supports: family- partner and parents, friends positives: making a plan and sticking to it i had the confidence to pursue promotion for first time.  Client Treatment Preferences  biweekly counseling and continue medication management [name].  Client Statement of Needs  pt "continue strategies shared and use when recognize when going into my highs or lows or in between". Continue same- " i just need to maintain." 2021/02/09 grief counseling with death of her father.  Treatment Level  outpatient counseling  Symptoms  death of father 04-05-21. History of at least one hypomanic, manic, or mixed mood episode.Marland Kitchen History of chronic or recurrent depression for which the client has taken antidepressant medication,  had outpatient treatment, or had a course of electroconvulsive therapy.  Problems Addressed  Grief / Loss Unresolved, Bipolar Disorde, Bipolar Disorder  Goals 1. Begin a healthy grieving process around the loss. Objective Begin verbalizing feelings associated with the loss. Target Date: 2022-04-15 Frequency: Daily  Progress: 50 Modality: individual  Related Interventions Assist the client in identifying and expressing feelings connected with his/her loss. 2. continue to use calming skills to assist coping w/ stress and managing mood Objective Maintain positive self care, use of mindfulness and other grounding activities to maintain mood stability. Target Date: 2022/02/09 Frequency: Daily  Progress: 70 Modality: individual  Related Interventions Assist the client in establishing a routine pattern of daily activities such as sleeping,  eating, solitary and social activities, and exercise; use of relaxation and grounding strategies routine and assist in assessing mood. 3. Develop healthy cognitive patterns and  beliefs about self and the world that lead to alleviation and help prevent the relapse of mood episodes. Objective Discuss and resolve troubling personal and interpersonal issues. Target Date: 2022-01-21 Frequency: Daily  Progress: 30 Modality: individual  Related Interventions Use interpersonal therapy techniques to explore and resolve issues surrounding marital stress, role disputes, role transitions, and conflict skills/boundaries; provide support and strategies for resolving identified interpersonal issues. Objective Identify and replace thoughts and behaviors that trigger manic or depressive symptoms. Target Date: 2022-01-21 Frequency: Daily  Progress: 60 Modality: individual  Related Interventions uitilize CBT strategies to assist in challenging negative thought patterns and beliefs.  Client participated in Treatment plan development and provided verbal consent.    [name], [facility]

## 2022-01-06 ENCOUNTER — Encounter: Payer: Self-pay | Admitting: Family Medicine

## 2022-01-06 ENCOUNTER — Telehealth: Payer: Self-pay

## 2022-01-06 ENCOUNTER — Ambulatory Visit (INDEPENDENT_AMBULATORY_CARE_PROVIDER_SITE_OTHER): Payer: BC Managed Care – PPO | Admitting: Family Medicine

## 2022-01-06 VITALS — BP 136/84 | HR 91 | Resp 16 | Ht 65.0 in | Wt 211.4 lb

## 2022-01-06 DIAGNOSIS — N6452 Nipple discharge: Secondary | ICD-10-CM | POA: Insufficient documentation

## 2022-01-06 DIAGNOSIS — I10 Essential (primary) hypertension: Secondary | ICD-10-CM

## 2022-01-06 HISTORY — DX: Nipple discharge: N64.52

## 2022-01-06 MED ORDER — DOXYCYCLINE HYCLATE 100 MG PO TABS: 100.0000 mg | ORAL_TABLET | Freq: Two times a day (BID) | ORAL | 0 refills | Status: DC

## 2022-01-06 NOTE — Patient Instructions (Signed)
F/U as before, call if you need me sooner  Discharge does have blood  Nurse please fax printed script for doxycycline to pharmacy of her choice, needs to start today  Nurse please order diagnoastic imaging needed, mammogram and ultrasound,  at [facility] as soon as possible   Doxycycline is prescribed for 10 days since there appears to be pus in the discharge  Thanks for choosing [facility], we consider it a privelige to serve you.

## 2022-01-06 NOTE — Progress Notes (Signed)
   Christina [name]     MRN: [id number]      DOB: 01-20-1976   HPI Christina [name] is here fwith a 10 day h/o unprovoked purulent drainage from left nipple, tender in sub areolar region, bloody, no fever or chills  ROS Denies recent fever or chills. Denies sinus pressure, nasal congestion, ear pain or sore throat. Denies chest congestion, productive cough or wheezing. Denies chest pains, palpitations and leg swelling Denies abdominal pain, nausea, vomiting,diarrhea or constipation.   Denies dysuria, frequency, hesitancy or incontinence. Denies joint pain, swelling and limitation in mobility. Denies headaches, seizures, numbness, or tingling. Denies depression, anxiety or insomnia. Denies skin break down or rash.   PE  BP 136/84   Pulse 91   Resp 16   Ht '5\' 5"'$  (1.651 m)   Wt 211 lb 6.4 oz (95.9 kg)   SpO2 97%   BMI 35.18 kg/m   Patient alert and oriented and in no cardiopulmonary distress.  HEENT: No facial asymmetry, EOMI,     Neck supple .  Chest: Clear to auscultation bilaterally. Breast: left nipple4 discharge , white, heme positive CVS: S1, S2 no murmurs, no S3.Regular rate.  ABD: Soft non tender.   Ext: No edema   Psych: Good eye contact, normal affect. Memory intact not anxious or depressed appearing.  CNS: CN 2-12 intact, power,  normal throughout.no focal deficits noted.   Assessment & Plan  Bloody discharge from left nipple No known rauma, 10 day history, urgent imaging needed  Purulent discharge from nipple Antibiotic course prescribed  Essential hypertension, benign DASH diet and commitment to daily physical activity for a minimum of 30 minutes discussed and encouraged, as a part of hypertension management. The importance of attaining a healthy weight is also discussed.     01/06/2022    3:37 PM 12/23/2021    9:08 AM 11/17/2021    2:02 PM 10/13/2021    9:40 AM 08/20/2021    9:03 AM 08/18/2021    9:43 AM 05/29/2021    2:58 PM  BP/Weight  Systolic BP 734  193  790 240    Diastolic BP 84 81  66 78    Wt. (Lbs) 211.4 208.12  218.2 216.12    BMI 35.18 kg/m2 35.72 kg/m2  36.31 kg/m2 35.96 kg/m2       Information is confidential and restricted. Go to Review Flowsheets to unlock data.     Controlled, no change in medication

## 2022-01-06 NOTE — Telephone Encounter (Signed)
Called to schedule the patient diagnostic mammogram and ultrasound.  Asking diagnostic for discharge at nipple and breast lump bilateral? Cannot do the bilateral order that was sent in last mammogram was done bilateral last September 2022 and insurance will not cover.  Can only can do left breast, needs a new order put in for left breast only for diagnostic and dx code of where in left breast Did not get anything order today. Call # [phone] to scheduled once order is in.

## 2022-01-07 ENCOUNTER — Other Ambulatory Visit: Payer: Self-pay | Admitting: Family Medicine

## 2022-01-07 DIAGNOSIS — N6452 Nipple discharge: Secondary | ICD-10-CM

## 2022-01-07 NOTE — Telephone Encounter (Signed)
Scheduled. Wednesday, 01/14/2022 at 10:30 am.

## 2022-01-07 NOTE — Telephone Encounter (Signed)
New order put in.

## 2022-01-12 DIAGNOSIS — I110 Hypertensive heart disease with heart failure: Secondary | ICD-10-CM | POA: Diagnosis not present

## 2022-01-13 ENCOUNTER — Encounter: Payer: Self-pay | Admitting: Family Medicine

## 2022-01-13 NOTE — Assessment & Plan Note (Signed)
DASH diet and commitment to daily physical activity for a minimum of 30 minutes discussed and encouraged, as a part of hypertension management. The importance of attaining a healthy weight is also discussed.     01/06/2022    3:37 PM 12/23/2021    9:08 AM 11/17/2021    2:02 PM 10/13/2021    9:40 AM 08/20/2021    9:03 AM 08/18/2021    9:43 AM 05/29/2021    2:58 PM  BP/Weight  Systolic BP 494 944  739 584    Diastolic BP 84 81  66 78    Wt. (Lbs) 211.4 208.12  218.2 216.12    BMI 35.18 kg/m2 35.72 kg/m2  36.31 kg/m2 35.96 kg/m2       Information is confidential and restricted. Go to Review Flowsheets to unlock data.     Controlled, no change in medication

## 2022-01-13 NOTE — Assessment & Plan Note (Signed)
No known rauma, 10 day history, urgent imaging needed

## 2022-01-13 NOTE — Assessment & Plan Note (Signed)
Antibiotic course prescribed 

## 2022-01-14 ENCOUNTER — Ambulatory Visit
Admission: RE | Admit: 2022-01-14 | Discharge: 2022-01-14 | Disposition: A | Discharge status: Home / Self Care | Payer: BC Managed Care – PPO | Source: Ambulatory Visit | Attending: Family Medicine | Admitting: Family Medicine

## 2022-01-14 DIAGNOSIS — N6452 Nipple discharge: Secondary | ICD-10-CM

## 2022-01-15 ENCOUNTER — Other Ambulatory Visit: Payer: Self-pay | Admitting: Family Medicine

## 2022-01-15 ENCOUNTER — Ambulatory Visit (INDEPENDENT_AMBULATORY_CARE_PROVIDER_SITE_OTHER): Payer: BC Managed Care – PPO | Admitting: Psychology

## 2022-01-15 DIAGNOSIS — N6452 Nipple discharge: Secondary | ICD-10-CM

## 2022-01-15 DIAGNOSIS — F319 Bipolar disorder, unspecified: Secondary | ICD-10-CM

## 2022-01-15 NOTE — Progress Notes (Signed)
[facility] Counselor/Therapist Progress Note  Patient ID: Christina [name], MRN: [id number],    Date: 01/15/2022  Time Spent: 12:00pm-12:47pm  Treatment Type: Individual Therapy  Pt is seen for a virtual video visit via caregility.   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  Reported Symptoms:  Pt reports continued stressed w/medical stressors for self and husband Mental Status Exam: Appearance:  NA     Behavior: Appropriate  Motor: Normal  Speech/Language:  Normal Rate  Affect: Appropriate  Mood: anxious  Thought process: normal  Thought content:   WNL  Sensory/Perceptual disturbances:   WNL  Orientation: oriented to person, place, time/date, and situation  Attention: Good  Concentration: Good  Memory: WNL  Fund of knowledge:  Good  Insight:   Good  Judgment:  Good  Impulse Control: Good   Risk Assessment: Danger to Self:  No Self-injurious Behavior: No Danger to Others: No Duty to Warn:no Physical Aggression / Violence:No   Subjective: Counselor assessed pt current functioning per pt report.  Processed w/pt stressors and anxiety.  Validated emotions w/ life threatening dx for husband and unknown prognosis for her.  Assisted pt in acknowledging warning signs for self and planning for self care activities and avoiding throwing self into work. Pt affect congruent w/ worry/anxiety.  Pt expressed frustration w/ lack of answers yesterday re: dx .  Pt discussed that her husband also has been referred to heart failure specialist as current providers unable to help w/ their scope of knowledge.  Pt is able to validate her stressors and emotions.  Pt recognize that work provides escape but also needs to monitor and not throw self into work for healthy coping.  Pt discussed walks as helpful and approach mom when finds self indecisive and on the couch w/self compassion.  Interventions: Cognitive Behavioral Therapy and  supportive  Diagnosis:Bipolar I disorder ([place])  Plan: Pt to f/u w/ cousneling in 1-2 week to assist coping w/ grief and mood stability.   Pt to f/u w/ [name] as scheduled.   Treatment Plan Client Abilities/Strengths  supports: family- partner and parents, friends positives: making a plan and sticking to it i had the confidence to pursue promotion for first time.  Client Treatment Preferences  biweekly counseling and continue medication management [name].  Client Statement of Needs  pt "continue strategies shared and use when recognize when going into my highs or lows or in between". Continue same- " i just need to maintain." January 30, 2021 grief counseling with death of her father.  Treatment Level  outpatient counseling  Symptoms  death of father 2021/03/26. History of at least one hypomanic, manic, or mixed mood episode.Marland Kitchen History of chronic or recurrent depression for which the client has taken antidepressant medication,  had outpatient treatment, or had a course of electroconvulsive therapy.  Problems Addressed  Grief / Loss Unresolved, Bipolar Disorde, Bipolar Disorder  Goals 1. Begin a healthy grieving process around the loss. Objective Begin verbalizing feelings associated with the loss. Target Date: 2022-04-15 Frequency: Daily  Progress: 50 Modality: individual  Related Interventions Assist the client in identifying and expressing feelings connected with his/her loss. 2. continue to use calming skills to assist coping w/ stress and managing mood Objective Maintain positive self care, use of mindfulness and other grounding activities to maintain mood stability. Target Date: 01/30/22 Frequency: Daily  Progress: 70 Modality: individual  Related Interventions Assist the client in establishing a routine pattern of daily activities  such as sleeping, eating, solitary and social activities, and exercise; use of relaxation and grounding strategies routine and assist in assessing  mood. 3. Develop healthy cognitive patterns and beliefs about self and the world that lead to alleviation and help prevent the relapse of mood episodes. Objective Discuss and resolve troubling personal and interpersonal issues. Target Date: 2022-01-21 Frequency: Daily  Progress: 30 Modality: individual  Related Interventions Use interpersonal therapy techniques to explore and resolve issues surrounding marital stress, role disputes, role transitions, and conflict skills/boundaries; provide support and strategies for resolving identified interpersonal issues. Objective Identify and replace thoughts and behaviors that trigger manic or depressive symptoms. Target Date: 2022-01-21 Frequency: Daily  Progress: 60 Modality: individual  Related Interventions uitilize CBT strategies to assist in challenging negative thought patterns and beliefs.  Client participated in Treatment plan development and provided verbal consent.        [name], [facility]

## 2022-01-16 ENCOUNTER — Encounter: Payer: Self-pay | Admitting: Family Medicine

## 2022-01-27 ENCOUNTER — Ambulatory Visit (INDEPENDENT_AMBULATORY_CARE_PROVIDER_SITE_OTHER): Payer: BC Managed Care – PPO | Admitting: Psychology

## 2022-01-27 DIAGNOSIS — F319 Bipolar disorder, unspecified: Secondary | ICD-10-CM

## 2022-01-27 DIAGNOSIS — F4321 Adjustment disorder with depressed mood: Secondary | ICD-10-CM

## 2022-01-27 NOTE — Progress Notes (Signed)
[place] Counselor Annual Adult Exam  Name: Christina [name] Date: 01/27/2022 MRN: [id number] DOB: 04/26/1976 PCP: [name], MD  Time spent: 79:59am-9:43am   Pt is seen for a virtual audio visit via phone.  Session was started on video through caregility but was unable to sustain connection for video visit.  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    Guardian/Payee:  self    Reason for Visit /Presenting Problem: Pt is seen for annual assessment to continue individual therapy for Bipolar 1 D/O and grief.  Pt has been established with this counselor since 2019, dx w/ Bipolar d/o in 2006 and been under the care of psychiatrist, Dr Adele Schilder.  Pt has had hx of manic and depressive episodes.  Pt had previously struggled w/ maintaining w/ jobs, endeavors and had periods of elevated mood and energy.  Then would experience depressed mood that followed- described as crawling into hole and unable to functioning and engage.  Pt also struggles w/ anxiety w/ perfectionist standards and feeling inadequate.  Pt has seen a lot of growth in the past several years with being consistent with her tx and self awareness.  Pt has experienced significant life stressors this past year- including the sudden death of her father in 04-06-21, husband being dx w/ heart failure April 2023, pt is currently going through dx test to dx or r/o breast cancer. Pt youngest son has received dx of Autism Spectrum d/o this past year and attended PreK w/ IEP and ABA therapy started this year.  Pt has endorses grief, feeling overwhelmed and some periods of being down moods/withdrawing for 1-2 days.  Pt has recognized limitations and reducing her load and commitments.  Pt is currently on intermittent FMLA w/ current family needs.     Mental Status Exam: Appearance:   Well Groomed     Behavior:  Appropriate  Motor:  Normal  Speech/Language:   Normal Rate  Affect:   Appropriate  Mood:  anxious  Thought process:  normal  Thought content:    WNL  Sensory/Perceptual disturbances:    WNL  Orientation:  oriented to person, place, time/date, and situation  Attention:  Good  Concentration:  Good  Memory:  WNL  Fund of knowledge:   Good  Insight:    Good  Judgment:   Good  Impulse Control:  Good   Reported Symptoms:  Pt endorses grieving her father- at times tearful and sad.  Pt feels that is able to acknowledge and address her grief better.  Pt reports feeling overwhelmed w/ stressors of husband's health, youngest needs, balancing work and life and extra responsibilities w/ transportation etc. Given husband's health restrictions current.  Pt expresses anxiety re: her dx  and prognosis of breast lump.  Pt has been able to increase awareness when she is doing "too much" and needs to step back and reduce commitments and ask for help.   Risk Assessment: Danger to Self:  No Self-injurious Behavior: No Danger to Others: No Duty to Warn:no Physical Aggression / Violence:No  Access to Firearms a concern: No  Gang Involvement:No  Patient / guardian was educated about steps to take if suicide or homicide risk level increases between visits: yes While future psychiatric events cannot be accurately predicted, the patient does not currently require acute inpatient psychiatric care and does not currently meet [facility] involuntary commitment criteria.  Substance Abuse History: Current substance abuse: No  Past Psychiatric History:   Previous psychological history is significant for Bipolar 1 D/O Outpatient Providers:Dr. Arfeen psychiatrist since 2006 and [place], [facility] since 2019. History of Psych Hospitalization: No  Psychological Testing:  none    Abuse History:  Victim of: No.,  No abuse hx    Report needed: No. Victim of Neglect:No. Perpetrator of  n/a   Witness / Exposure to Domestic Violence: No   Protective Services Involvement: No   Witness to [employer] Violence:  No   Family History:  Family History  Problem Relation Age of Onset   Diabetes Mother    Hypertension Mother    Fibromyalgia Mother    Stroke Mother    Diabetes Father    Hypertension Father    Asthma Father    Heart disease Father    Depression Father    Colon cancer Neg Hx    Breast cancer Neg Hx   youngest son was dx w/ Autism Spectrum D/O Dec 2022 and complete PreK this year w/ IEP. He has started ABA therapy in 2023.   Pt oldest son dx of ADHD.   Pt husband has been dx w/ heart failure April 2023 and currently working w/ specialist to tx.  He had hx of alcohol abuse but minimizes. - causes conflict and marital stress.  pt parents are aging and have medical concerns. Pt father died unexpectedly 23-Mar-2021.  Her maternal grandmother died in Nov 202. Her mom has hx of stroke.    Living situation: the patient lives with her husband, 15y/o son Cherylann Banas, 8y/o daughter Alver Fisher, 5y/o son Grayland Ormond in [place], [place]  Pt grew up [place], [place]. black family with both parents master's degree and pt as only child.  Sexual Orientation: Straight  Relationship Status: married to her husband, Lennette Bihari, for 18 years.  Name of spouse / other:Kevin If a parent, number of children:3 children.  Support Systems: husband, mom, friends, sorority.   Financial Stress:  Yes  w/ reduced income since husband on medical leave and increased expenses w/ medical bills.   Income/Employment/Disability: Employment.  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  Pt in process of completing master's counseling- had to postpone w/ family stressors.   Military Service: No   Educational History: Education: post Forensic psychologist work or degree  Religion/Sprituality/World View: Christian   Any cultural differences that may affect / interfere with treatment:  not applicable   Recreation/Hobbies: tennis,  involvement in sorority, time w/ family  Stressors: Educational concerns   Financial difficulties   Health problems   Loss of father in 2022, maternal gm in 2021   Other: Son's needs w/ ASD.  Balance work, family, school    Strengths: Supportive Relationships, Family, Friends, Financial controller, Conservator, museum/gallery, Able to [employer], and motivated, intelligent and hard working  Barriers:  mental heath stigma   Legal History: Pending legal issue / charges: The patient has no significant history of legal issues. 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 Oneida Alar: Torturous transverse and sigmoid colon, internal hemorrhoids.  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 mild chronic gastritis on path.    ESOPHAGOGASTRODUODENOSCOPY (EGD) WITH PROPOFOL N/A 10/26/2017   Dr. Oneida Alar: Gastritis, gastric polyps.  Biopsies benign.  Small bowel biopsies negative for celiac.  No H. pylori.   HERNIA REPAIR  12/2018   REDUCTION MAMMAPLASTY     1994   TRIGGER FINGER RELEASE Bilateral 06/2015   TUBAL LIGATION Bilateral 03/06/2016   Procedure: BILATERAL TUBAL LIGATION;  Surgeon: Ena Dawley, MD;  Location: [place] [facility];  Service: Obstetrics;  Laterality: Bilateral;    Medications: Current Outpatient Medications  Medication Sig Dispense Refill   acetaminophen (TYLENOL) 500 MG tablet Take 1,000 mg every 8 (eight) hours as needed by mouth for mild pain.      ARIPiprazole (ABILIFY) 5 MG tablet Take 1 tablet (5 mg total) by mouth every evening. 90 tablet 0   azelastine (ASTELIN) 0.1 % nasal spray Place 2 sprays into both nostrils 2 (two) times daily. Use in each nostril as directed 30 mL 12   B-D UF III MINI PEN NEEDLES 31G X 5 MM MISC USE AS DIRECTED 100 each 0   doxycycline (VIBRA-TABS) 100 MG tablet Take 1 tablet (100 mg total) by mouth 2 (two) times daily. 20 tablet 0   fluticasone (FLONASE) 50 MCG/ACT nasal spray Place 2 sprays into both nostrils daily. 48 mL 5   glucose blood test strip Use as instructed, three times daily 300 each 12   levocetirizine (XYZAL) 5 MG tablet Take 5 mg by mouth every evening.     losartan (COZAAR) 50 MG tablet Take 1 tablet (50 mg total) by mouth daily. 90 tablet 1   metFORMIN (GLUCOPHAGE) 1000 MG tablet TAKE 1 TABLET BY MOUTH TWICE DAILY WITH MEALS 180 tablet 0   montelukast (SINGULAIR) 10 MG tablet Take 1 tablet (10 mg total) by mouth at bedtime. 90 tablet 3   omeprazole (PRILOSEC) 40 MG capsule Take 1 capsule (40 mg total) by mouth daily. 90 capsule 3   ONETOUCH DELICA LANCETS 94I MISC Three times daily testing dx e11.9 300 each 5   OZEMPIC, 1 MG/DOSE, 2 MG/1.5ML SOPN Inject 1 mg into the skin once a week.      rosuvastatin (CRESTOR) 5 MG tablet Take one tablet by mouth three times weekly 36 tablet 3   sodium chloride (OCEAN) 0.65 % SOLN nasal spray Place 1 spray into both nostrils 4 (four) times daily as needed for congestion.     No current facility-administered medications for this visit.    Allergies  Allergen Reactions   Other Anaphylaxis  Tree nuts   Peanut Oil Anaphylaxis   Peanut-Containing Drug Products Anaphylaxis   Augmentin [Amoxicillin-Pot Clavulanate] Nausea And Vomiting    Causes stomach cramps. Patient can take amoxicillin, not Augmentin. Has patient had a PCN reaction causing immediate rash, facial/tongue/throat swelling, SOB or lightheadedness with hypotension: No Has patient had a PCN reaction causing severe rash involving mucus membranes or skin necrosis: No Has patient had a PCN reaction that required hospitalization: No Has patient had a PCN reaction occurring within the last 10 years: No If all of the above answers are "NO", then may proceed with Cephalospori    Diagnoses:  Bipolar I disorder ([place])  Grief  Plan of Care: Pt is a 46y/o married black female who is seeking continued tx of Bipolar 1 d/o.  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  Pt has had signficant stressors this year w/ her father's death 03/28/2021, son's dx of ASD and beginning tx for him, husband dx of heart failure 11/2021 and her recent dx evaluation to r/o or dx breast cancer. Pt has been consistent w/ weekly to biweekly counseling in the past year w/ this provider and f/u w/ her psychiatrist.  Pt hasn't experienced any manic episodes, some days of depressed mood.  Pt to continue w/ weekly to biweekly counseling, f/u w/ Dr. Marchia Bond, and her PCP and specialist for medical concerns.   Treatment Plan 01/27/22  Client Abilities/Strengths  supports: family- partner and mom, friends  positives: self awareness, expressing feelings, advocating for  self. Motivated, hardworking, intelligent, family centered  Client Treatment Preferences  Weekly to biweekly counseling and continue medication management [name].   Client Statement of Needs  pt "to preserve my mental health.  Not be afraid to dream big.  Be able to continue to improve. Maintain compliance with my therapy, medications, holistic approaches.  I don't want to go back to where I was in the past"  Treatment Level  outpatient counseling   Symptoms  death of father 2021-03-28.  Husband dx w/ heart failure 11/2021. Grief and anxiety. History of at least one hypomanic, manic, or mixed mood episode.Marland Kitchen History of chronic or recurrent depression for which the client has taken antidepressant medication and had outpatient treatment,   Goals 1. Begin a healthy grieving process around the loss.  Objective Begin verbalizing feelings associated with the loss. Target Date: 2023-01-28 Frequency: Daily  Progress: 50 Modality: individual  Related Interventions Assist the client in identifying and expressing feelings connected with his/her loss.  2. continue to use calming skills to assist coping w/ stress and managing mood Objective Maintain positive self care, use of mindfulness and other grounding activities to maintain mood stability. Target Date: 2023-01-28 Frequency: Daily  Progress: 70 Modality: individual  Related Interventions Assist the client in establishing a routine pattern of daily activities such as sleeping, eating, solitary and social activities, and exercise; use of relaxation and grounding strategies routine and assist in assessing mood.  3. Develop healthy cognitive patterns and beliefs about self and the world that lead to alleviation and help prevent the relapse of mood episodes.  Objective Identify and replace thoughts and behaviors that trigger manic or depressive symptoms. Target Date: 2023-01-28 Frequency: Daily  Progress: 60 Modality: individual  Related  Interventions utilize CBT strategies to assist in challenging negative thought patterns and beliefs and ACT strategies to assist in actions, decisions and interactions consistent w/values.  Client participated in Treatment plan development and provided  verbal consent.       [name], [facility]

## 2022-01-29 ENCOUNTER — Ambulatory Visit
Admission: RE | Admit: 2022-01-29 | Discharge: 2022-01-29 | Disposition: A | Discharge status: Home / Self Care | Payer: BC Managed Care – PPO | Source: Ambulatory Visit | Attending: Family Medicine | Admitting: Family Medicine

## 2022-01-29 DIAGNOSIS — N6452 Nipple discharge: Secondary | ICD-10-CM

## 2022-01-29 MED ORDER — GADOBUTROL 1 MMOL/ML IV SOLN
9.0000 mL | Freq: Once | INTRAVENOUS | Status: AC | PRN
  Administered 2022-01-29: 9 mL via INTRAVENOUS

## 2022-02-01 ENCOUNTER — Encounter: Payer: Self-pay | Admitting: Family Medicine

## 2022-02-02 ENCOUNTER — Other Ambulatory Visit: Payer: Self-pay | Admitting: Family Medicine

## 2022-02-02 DIAGNOSIS — N6320 Unspecified lump in the left breast, unspecified quadrant: Secondary | ICD-10-CM

## 2022-02-06 DIAGNOSIS — Z794 Long term (current) use of insulin: Secondary | ICD-10-CM | POA: Diagnosis not present

## 2022-02-06 DIAGNOSIS — I10 Essential (primary) hypertension: Secondary | ICD-10-CM | POA: Diagnosis not present

## 2022-02-06 DIAGNOSIS — D3501 Benign neoplasm of right adrenal gland: Secondary | ICD-10-CM | POA: Diagnosis not present

## 2022-02-06 DIAGNOSIS — E1165 Type 2 diabetes mellitus with hyperglycemia: Secondary | ICD-10-CM | POA: Diagnosis not present

## 2022-02-06 DIAGNOSIS — E785 Hyperlipidemia, unspecified: Secondary | ICD-10-CM | POA: Diagnosis not present

## 2022-02-09 ENCOUNTER — Ambulatory Visit
Admission: RE | Admit: 2022-02-09 | Discharge: 2022-02-09 | Disposition: A | Discharge status: Home / Self Care | Payer: BC Managed Care – PPO | Source: Ambulatory Visit | Attending: Family Medicine | Admitting: Family Medicine

## 2022-02-09 ENCOUNTER — Other Ambulatory Visit (HOSPITAL_COMMUNITY): Payer: Self-pay | Admitting: Diagnostic Radiology

## 2022-02-09 DIAGNOSIS — N6452 Nipple discharge: Secondary | ICD-10-CM | POA: Diagnosis not present

## 2022-02-09 DIAGNOSIS — N6012 Diffuse cystic mastopathy of left breast: Secondary | ICD-10-CM | POA: Diagnosis not present

## 2022-02-09 DIAGNOSIS — N6342 Unspecified lump in left breast, subareolar: Secondary | ICD-10-CM | POA: Diagnosis not present

## 2022-02-09 DIAGNOSIS — N6320 Unspecified lump in the left breast, unspecified quadrant: Secondary | ICD-10-CM

## 2022-02-09 HISTORY — PX: BREAST BIOPSY: SHX20

## 2022-02-10 ENCOUNTER — Other Ambulatory Visit: Payer: Self-pay | Admitting: Family Medicine

## 2022-02-10 ENCOUNTER — Ambulatory Visit (INDEPENDENT_AMBULATORY_CARE_PROVIDER_SITE_OTHER): Payer: BC Managed Care – PPO | Admitting: Psychology

## 2022-02-10 DIAGNOSIS — R9389 Abnormal findings on diagnostic imaging of other specified body structures: Secondary | ICD-10-CM

## 2022-02-10 DIAGNOSIS — F319 Bipolar disorder, unspecified: Secondary | ICD-10-CM | POA: Diagnosis not present

## 2022-02-10 NOTE — Progress Notes (Signed)
[place] Counselor/Therapist Progress Note  Patient ID: Christina [name], MRN: [id number],    Date: 02/10/2022  Time Spent: 10:01am-10:41am  Treatment Type: Individual Therapy  Pt is seen for a virtual video visit via caregility.   pt joins from her parked car and counselor from her home office.  Reported Symptoms:  Pt reports continued stressed w/medical stressors for self and husband, pt reports feeling that better managing  Mental Status Exam: Appearance:  NA     Behavior: Appropriate  Motor: Normal  Speech/Language:  Normal Rate  Affect: Appropriate  Mood: normal  Thought process: normal  Thought content:   WNL  Sensory/Perceptual disturbances:   WNL  Orientation: oriented to person, place, time/date, and situation  Attention: Good  Concentration: Good  Memory: WNL  Fund of knowledge:  Good  Insight:   Good  Judgment:  Good  Impulse Control: Good   Risk Assessment: Danger to Self:  No Self-injurious Behavior: No Danger to Others: No Duty to Warn:no Physical Aggression / Violence:No   Subjective: Counselor assessed pt current functioning per pt report.  Processed w/pt stressors and anxiety.  Validating emotions, response to stressors.  Reflected positives of recognizing need to step back to reduce load and use of stress management.  Pt affect wnl.  Pt reported that she had her biopsy yesterday and anticipates results tomorrow.  Pt reported that focus on what is in her control and acknowledging things that needs to step back from and not stressing w/ doing what she can't.  Pt reported has been very helpful to not have travel for son's ABA therapy.  Pt discussed feeling less overwhelmed since taking steps back from things.   Interventions: Cognitive Behavioral Therapy and supportive  Diagnosis:Bipolar I disorder ([place])  Plan: Pt to f/u w/ cousneling in 1 week to assist coping w/ grief and mood stability.   Pt to f/u w/ [name] as scheduled.  Treatment  Plan 01/27/22   Client Abilities/Strengths  supports: family- partner and mom, friends  positives: self awareness, expressing feelings, advocating for self. Motivated, hardworking, intelligent, family centered   Client Treatment Preferences  Weekly to biweekly counseling and continue medication management [name].    Client Statement of Needs  pt "to preserve my mental health.  Not be afraid to dream big.  Be able to continue to improve. Maintain compliance with my therapy, medications, holistic approaches.  I don't want to go back to where I was in the past"   Treatment Level  outpatient counseling    Symptoms  death of father 2021-04-11.  Husband dx w/ heart failure 11/2021. Grief and anxiety. History of at least one hypomanic, manic, or mixed mood episode.Marland Kitchen History of chronic or recurrent depression for which the client has taken antidepressant medication and had outpatient treatment,     Goals 1. Begin a healthy grieving process around the loss.   Objective Begin verbalizing feelings associated with the loss. Target Date: 2023-01-28 Frequency: Daily  Progress: 50 Modality: individual  Related Interventions Assist the client in identifying and expressing feelings connected with his/her loss.   2. continue to use calming skills to assist coping w/ stress and managing mood Objective Maintain positive self care, use of mindfulness and other grounding activities to maintain mood stability. Target Date: 2023-01-28 Frequency: Daily  Progress: 70 Modality: individual  Related Interventions Assist the client in establishing a routine pattern of daily activities such as sleeping, eating, solitary and social activities, and exercise; use of  relaxation and grounding strategies routine and assist in assessing mood.   3. Develop healthy cognitive patterns and beliefs about self and the world that lead to alleviation and help prevent the relapse of mood episodes.   Objective Identify and  replace thoughts and behaviors that trigger manic or depressive symptoms. Target Date: 2023-01-28 Frequency: Daily  Progress: 60 Modality: individual  Related Interventions utilize CBT strategies to assist in challenging negative thought patterns and beliefs and ACT strategies to assist in actions, decisions and interactions consistent w/values.   Client participated in Treatment plan development and provided verbal consent        Christina [name] [place], [facility]

## 2022-02-12 DIAGNOSIS — H524 Presbyopia: Secondary | ICD-10-CM | POA: Diagnosis not present

## 2022-02-12 LAB — HM DIABETES EYE EXAM

## 2022-02-13 ENCOUNTER — Ambulatory Visit
Admission: RE | Admit: 2022-02-13 | Discharge: 2022-02-13 | Disposition: A | Discharge status: Home / Self Care | Payer: BC Managed Care – PPO | Source: Ambulatory Visit | Attending: Family Medicine | Admitting: Family Medicine

## 2022-02-13 DIAGNOSIS — R9389 Abnormal findings on diagnostic imaging of other specified body structures: Secondary | ICD-10-CM

## 2022-02-13 DIAGNOSIS — N6342 Unspecified lump in left breast, subareolar: Secondary | ICD-10-CM | POA: Diagnosis not present

## 2022-02-13 DIAGNOSIS — D242 Benign neoplasm of left breast: Secondary | ICD-10-CM | POA: Diagnosis not present

## 2022-02-13 HISTORY — PX: BREAST BIOPSY: SHX20

## 2022-02-13 MED ORDER — GADOBUTROL 1 MMOL/ML IV SOLN
9.0000 mL | Freq: Once | INTRAVENOUS | Status: AC | PRN
Start: 2022-02-13 — End: 2022-02-13
  Administered 2022-02-13: 9 mL via INTRAVENOUS

## 2022-02-16 ENCOUNTER — Telehealth (INDEPENDENT_AMBULATORY_CARE_PROVIDER_SITE_OTHER): Payer: BC Managed Care – PPO | Admitting: Psychiatry

## 2022-02-16 ENCOUNTER — Encounter (HOSPITAL_COMMUNITY): Payer: Self-pay | Admitting: Psychiatry

## 2022-02-16 DIAGNOSIS — F419 Anxiety disorder, unspecified: Secondary | ICD-10-CM

## 2022-02-16 DIAGNOSIS — F3162 Bipolar disorder, current episode mixed, moderate: Secondary | ICD-10-CM | POA: Diagnosis not present

## 2022-02-16 MED ORDER — ARIPIPRAZOLE 5 MG PO TABS: 5.0000 mg | ORAL_TABLET | Freq: Every evening | ORAL | 0 refills | Status: DC

## 2022-02-16 NOTE — Progress Notes (Signed)
Virtual Visit via Telephone Note  I connected with Christina [name] on 02/16/22 at  2:00 PM EDT by telephone and verified that I am speaking with the correct person using two identifiers.  Location: Patient: In Car Provider: Home Office   I discussed the limitations, risks, security and privacy concerns of performing an evaluation and management service by telephone and the availability of in person appointments. I also discussed with the patient that there may be a patient responsible charge related to this service. The patient expressed understanding and agreed to proceed.   History of Present Illness: Patient is evaluated by phone session.  She is in the car.  Today she is very happy because recently discovered mass in the breast and today she find out it is not malignant.  Overall she feels things are going very well.  Her blood sugar is stable.  She lost few pounds and denies any major other concerns or issues in her life.  She reported her husband still have struggle with his illness who is diagnosed with CHF few months ago and doctors are trying to manage his CHF.  Her job is going well and today she has a presentation in front of commissioner to get $3 million grant and she is hoping they approved.  She sleeps good.  She denies any paranoia, hallucination, suicidal thoughts.  She has no tremors, shakes or any EPS.  She is getting her appointment regularly with providers.  She like to keep the Abilify that is keeping her mood stable.  She denies any major panic attack.    Past Psychiatric History:  H/O depression, anxiety, mania and psychosis.  Admitted in [facility] at [facility] due to suicidal gestures.  Seen in this office since 2006. Tried Prozac and Wellbutrin.    Psychiatric Specialty Exam: Physical Exam  Review of Systems  Weight 203 lb (92.1 kg), unknown if currently breastfeeding.There is no height or weight on file to calculate BMI.  General Appearance: NA  Eye Contact:  NA  Speech:  Clear  and Coherent and Normal Rate  Volume:  Normal  Mood:  Euthymic  Affect:  NA  Thought Process:  Goal Directed  Orientation:  Full (Time, Place, and Person)  Thought Content:  Logical  Suicidal Thoughts:  No  Homicidal Thoughts:  No  Memory:  Immediate;   Good Recent;   Good Remote;   Good  Judgement:  Intact  Insight:  Present  Psychomotor Activity:  Normal  Concentration:  Concentration: Good and Attention Span: Good  Recall:  Good  Fund of Knowledge:  Good  Language:  Good  Akathisia:  No  Handed:  Right  AIMS (if indicated):     Assets:  Communication Skills Desire for Improvement Housing Resilience Social Support Talents/Skills Transportation  ADL's:  Intact  Cognition:  WNL  Sleep:   ok      Assessment and Plan: Bipolar disorder type I.  Anxiety.  I reviewed blood work results.  Her last hemoglobin A1c is 6.9.  Continue Abilify 5 mg daily.  Recommended to call us back if she has any question or any concern.  Follow-up in 3 months.  Follow Up Instructions:    I discussed the assessment and treatment plan with the patient. The patient was provided an opportunity to ask questions and all were answered. The patient agreed with the plan and demonstrated an understanding of the instructions.   The patient was advised to call back or seek an in-person evaluation if the  symptoms worsen or if the condition fails to improve as anticipated.  Collaboration of Care: Primary Care Provider AEB notes are available in epic to review.  Patient/Guardian was advised Release of Information must be obtained prior to any record release in order to collaborate their care with an outside provider. Patient/Guardian was advised if they have not already done so to contact the registration department to sign all necessary forms in order for [place] to release information regarding their care.   Consent: Patient/Guardian gives verbal consent for treatment and assignment of benefits for services  provided during this visit. Patient/Guardian expressed understanding and agreed to proceed.    I provided 20 minutes of non-face-to-face time during this encounter.   Kathlee Nations, MD

## 2022-02-18 ENCOUNTER — Other Ambulatory Visit: Payer: Self-pay | Admitting: Family Medicine

## 2022-02-18 ENCOUNTER — Ambulatory Visit: Payer: BC Managed Care – PPO | Admitting: Psychology

## 2022-02-18 DIAGNOSIS — D369 Benign neoplasm, unspecified site: Secondary | ICD-10-CM

## 2022-02-20 ENCOUNTER — Ambulatory Visit
Admission: RE | Admit: 2022-02-20 | Discharge: 2022-02-20 | Disposition: A | Discharge status: Home / Self Care | Payer: BC Managed Care – PPO | Source: Ambulatory Visit | Attending: Family Medicine | Admitting: Family Medicine

## 2022-02-20 DIAGNOSIS — R928 Other abnormal and inconclusive findings on diagnostic imaging of breast: Secondary | ICD-10-CM | POA: Diagnosis not present

## 2022-02-20 DIAGNOSIS — D369 Benign neoplasm, unspecified site: Secondary | ICD-10-CM

## 2022-02-24 ENCOUNTER — Telehealth: Payer: Self-pay | Admitting: Family Medicine

## 2022-02-24 NOTE — Telephone Encounter (Signed)
Pls call and speak with pt, let her know that I have been following imaging reports and biopsy report of intraduct papilloma which she needs surgical consultation to have this removed,  I [facility] at  [facility] [place] [facility] who specializes in this . If she agrees please refer , I will assist if needed in getting specific name of Surgeon, please give me feedback

## 2022-02-25 ENCOUNTER — Ambulatory Visit (INDEPENDENT_AMBULATORY_CARE_PROVIDER_SITE_OTHER): Payer: BC Managed Care – PPO | Admitting: Psychology

## 2022-02-25 DIAGNOSIS — F3162 Bipolar disorder, current episode mixed, moderate: Secondary | ICD-10-CM

## 2022-02-25 NOTE — Progress Notes (Signed)
[facility] Counselor/Therapist Progress Note  Patient ID: CARLEEN RHUE, MRN: [id number],    Date: 02/25/2022  Time Spent: 9:59am-10:45am  Treatment Type: Individual Therapy  Pt is seen for a virtual video visit via caregility.   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  Reported Symptoms:  Pt reports increased emotional this past week w/ grief/sadness.  Pt reports reducing her load to manage stress. Mental Status Exam: Appearance:  NA     Behavior: Appropriate  Motor: Normal  Speech/Language:  Normal Rate  Affect: Appropriate  Mood: normal  Thought process: normal  Thought content:   WNL  Sensory/Perceptual disturbances:   WNL  Orientation: oriented to person, place, time/date, and situation  Attention: Good  Concentration: Good  Memory: WNL  Fund of knowledge:  Good  Insight:   Good  Judgment:  Good  Impulse Control: Good   Risk Assessment: Danger to Self:  No Self-injurious Behavior: No Danger to Others: No Duty to Warn:no Physical Aggression / Violence:No   Subjective: Counselor assessed pt current functioning per pt report.  Processed w/pt emotions, grief and stressors.  Reflected awareness of grief and process of grieving and increased emotions w/ year anniversary approaching.  Explored ways of being intentional and allowing self to embrace emotions w/ supports and coping.  Pt affect wnl .  Pt reported past week recognized increased emotions and grief- feeling sadness.  Pt reports that wants to be at family reunion to honor dad, but also aware not ready.  Pt reported good visit for husband w/ new cardiologist and his approach.  Pt shared she doesn't have cancer- dx w/ papilloma that will need to be surgical removed. Pt discussed how she has recognized a lot of past year felt like a zombie in her grief and recognized w/ added family stressors. Pt discussed ways she is coping w/ reducing stress she has control over and has stepped  away from some added projects, etc work and personal.   Interventions: Public relations account executive Therapy and supportive  Diagnosis:Bipolar 1 disorder, mixed, moderate ([place])  Plan: Pt to f/u w/ cousneling in 1 week to assist coping w/ grief and mood stability.   Pt to f/u w/ [name] as scheduled.  Treatment Plan 01/27/22   Client Abilities/Strengths  supports: family- partner and mom, friends  positives: self awareness, expressing feelings, advocating for self. Motivated, hardworking, intelligent, family centered   Client Treatment Preferences  Weekly to biweekly counseling and continue medication management [name].    Client Statement of Needs  pt "to preserve my mental health.  Not be afraid to dream big.  Be able to continue to improve. Maintain compliance with my therapy, medications, holistic approaches.  I don't want to go back to where I was in the past"   Treatment Level  outpatient counseling    Symptoms  death of father 04-Apr-2021.  Husband dx w/ heart failure 11/2021. Grief and anxiety. History of at least one hypomanic, manic, or mixed mood episode.Marland Kitchen History of chronic or recurrent depression for which the client has taken antidepressant medication and had outpatient treatment,     Goals 1. Begin a healthy grieving process around the loss.   Objective Begin verbalizing feelings associated with the loss. Target Date: 2023-01-28 Frequency: Daily  Progress: 50 Modality: individual  Related Interventions Assist the client in identifying and expressing feelings connected with his/her loss.   2. continue to use calming skills to assist coping w/ stress  and managing mood Objective Maintain positive self care, use of mindfulness and other grounding activities to maintain mood stability. Target Date: 2023-01-28 Frequency: Daily  Progress: 70 Modality: individual  Related Interventions Assist the client in establishing a routine pattern of daily activities such as sleeping,  eating, solitary and social activities, and exercise; use of relaxation and grounding strategies routine and assist in assessing mood.   3. Develop healthy cognitive patterns and beliefs about self and the world that lead to alleviation and help prevent the relapse of mood episodes.   Objective Identify and replace thoughts and behaviors that trigger manic or depressive symptoms. Target Date: 2023-01-28 Frequency: Daily  Progress: 60 Modality: individual  Related Interventions utilize CBT strategies to assist in challenging negative thought patterns and beliefs and ACT strategies to assist in actions, decisions and interactions consistent w/values.   Client participated in Treatment plan development and provided verbal consent       [name] [place], [facility]

## 2022-02-25 NOTE — Telephone Encounter (Signed)
She has an appt with Dr Harrington Challenger at [facility] on Friday for surgical consultation

## 2022-02-27 ENCOUNTER — Ambulatory Visit: Payer: Self-pay | Admitting: Surgery

## 2022-02-27 DIAGNOSIS — N6452 Nipple discharge: Secondary | ICD-10-CM | POA: Diagnosis not present

## 2022-02-27 DIAGNOSIS — D242 Benign neoplasm of left breast: Secondary | ICD-10-CM

## 2022-02-27 NOTE — H&P (View-Only) (Signed)
Subjective   Chief Complaint: Breast Discharge     History of Present Illness: Christina [name] is a 46 y.o. female who is seen today as an office consultation at the request of Dr. Moshe Cipro for evaluation of Breast Discharge .    This is a 46 year old female with diabetes and hypertension who presents after a her normal routine screening mammogram in September 2022.  In May of this year, she began having purulent and bloody discharge from the left nipple associated with some discomfort.  The discharge improved after a 10-day course of antibiotics prescribed by her primary care physician.  She had a left diagnostic mammogram and ultrasound in June that showed some mild duct ectasia but no sign of mass.  Subsequently, she underwent an MRI which showed a 6 mm enhancing mass in the retroareolar left breast  She underwent an attempt at an ultrasound-guided biopsy of this area which revealed only dilated ducts and focal fibrocystic changes.  She then underwent MR guided biopsy on 02/13/2022 that revealed an intraductal papilloma.  She has developed a hematoma in this area with some associated tenderness.  No further nipple discharge.  No family history of breast cancer.  She has had previous bilateral breast reductions in 1994.   Review of Systems: A complete review of systems was obtained from the patient.  I have reviewed this information and discussed as appropriate with the patient.  See HPI as well for other ROS.  Review of Systems  Constitutional: Negative.   HENT: Negative.    Eyes: Negative.   Respiratory: Negative.    Cardiovascular: Negative.   Gastrointestinal: Negative.   Genitourinary: Negative.   Musculoskeletal: Negative.   Skin: Negative.   Neurological: Negative.   Endo/Heme/Allergies: Negative.   Psychiatric/Behavioral: Negative.       Medical History: Past Medical History:  Diagnosis Date   Diabetes mellitus type 2, uncomplicated (CMS-HCC)    Hypertension      Patient Active Problem List  Diagnosis   Bipolar 1 disorder (CMS-HCC)   Bloody discharge from left nipple   Depression   Diabetes mellitus (CMS-HCC)   Essential hypertension, benign   Esophageal reflux   Family history of blood disorder   Family history of sickle cell trait   Fatty liver   Morbid obesity (CMS-HCC)    Past Surgical History:  Procedure Laterality Date   REDUCTION MAMMAPLASTY  1994   Dermoid tumor  2000   Trigger finger release Right 2016   Ring finger     Allergies  Allergen Reactions   Other Anaphylaxis    Tree nuts   Peanut Anaphylaxis   Peanut Oil Anaphylaxis   Augmentin [Amoxicillin-Pot Clavulanate] Unknown   Augmentin [Amoxicillin-Pot Clavulanate] Other (See Comments)    CRAMPS    Current Outpatient Medications on File Prior to Visit  Medication Sig Dispense Refill   levocetirizine (XYZAL) 5 MG tablet Take 1 tablet by mouth once daily     semaglutide (OZEMPIC) 1 mg/dose (2 mg/1.5 mL) pen injector Inject subcutaneously     aspirin 81 MG EC tablet Take 81 mg by mouth once daily.     HUMALOG KWIKPEN 100 unit/mL pen injector 30 units IN AM AND 16 units IN THE EVENING      HUMULIN N KWIKPEN 100 unit/mL (3 mL) pen injector 19 units AM & 14 units IN THE EVENING      loratadine (CLARITIN) 10 mg capsule Take 10 mg by mouth once daily.     metFORMIN (GLUCOPHAGE) 1000  MG tablet Take 1,000 mg by mouth 2 (two) times daily with meals.     montelukast (SINGULAIR) 10 mg tablet Take 1 tablet by mouth at bedtime     omeprazole (PRILOSEC) 20 MG DR capsule Take 20 mg by mouth once daily.     ondansetron (ZOFRAN-ODT) 4 MG disintegrating tablet      prenatal vit-iron fumarate-FA (PRENAVITE) tablet Take 1 tablet by mouth once daily.     No current facility-administered medications on file prior to visit.    Family History  Problem Relation Age of Onset   Diabetes Father    High blood pressure (Hypertension) Father    Heart failure Father      Social  History   Tobacco Use  Smoking Status Never  Smokeless Tobacco Never     Social History   Socioeconomic History   Marital status: Married  Tobacco Use   Smoking status: Never   Smokeless tobacco: Never    Objective:    Vitals:   02/27/22 0916  BP: 118/80  Pulse: 102  Temp: 37 C (98.6 F)  SpO2: 96%  Weight: 92.2 kg (203 lb 3.2 oz)  Height: 165.1 cm ('5\' 5"'$ )    Body mass index is 33.81 kg/m.  Physical Exam   Constitutional:  WDWN in NAD, conversant, no obvious deformities; lying in bed comfortably Eyes:  Pupils equal, round; sclera anicteric; moist conjunctiva; no lid lag HENT:  Oral mucosa moist; good dentition  Neck:  No masses palpated, trachea midline; no thyromegaly Lungs:  CTA bilaterally; normal respiratory effort Breasts:  symmetric, no nipple discharge; bilateral fibrocystic changes, 1.5 cm palpable firmness under the lateral edge of the left areola but no other palpable masses in the left breast.  No palpable masses on the right side; no lymphadenopathy on either side CV:  Regular rate and rhythm; no murmurs; extremities well-perfused with no edema Abd:  +bowel sounds, soft, non-tender, no palpable organomegaly; no palpable hernias Musc:  Normal gait; no apparent clubbing or cyanosis in extremities Lymphatic:  No palpable cervical or axillary lymphadenopathy Skin:  Warm, dry; no sign of jaundice Psychiatric - alert and oriented x 4; calm mood and affect   Labs, Imaging and Diagnostic Testing: CLINICAL DATA:  Spontaneous bloody left nipple discharge for 2 weeks.   EXAM: DIGITAL DIAGNOSTIC UNILATERAL LEFT MAMMOGRAM WITH TOMOSYNTHESIS AND CAD; ULTRASOUND LEFT BREAST LIMITED   TECHNIQUE: Left digital diagnostic mammography and breast tomosynthesis was performed. The images were evaluated with computer-aided detection.; Targeted ultrasound examination of the left breast was performed.   COMPARISON:  Previous exam(s).   ACR Breast Density Category b:  There are scattered areas of fibroglandular density.   FINDINGS: Mild ductal ectasia. No suspicious masses, calcifications, or distortion are identified in the left breast.   Targeted ultrasound is performed, showing mild ductal ectasia. There is some debris within the ectatic milk ducts but no discrete intraductal mass identified.   IMPRESSION: Mild ductal ectasia.  No evidence of malignancy or intraductal mass.   RECOMMENDATION: Recommend breast MRI given the pattern of left nipple discharge without a mammographic or sonographic cause identified.   I have discussed the findings and recommendations with the patient. If applicable, a reminder letter will be sent to the patient regarding the next appointment.   BI-RADS CATEGORY  2: Benign.     Electronically Signed   By: Dorise Bullion III M.D.   On: 01/14/2022 11:49  CLINICAL DATA:  45 year old female with history of spontaneous bloody left nipple discharge  initially occurring early June. Diagnostic mammography and ultrasound 01/14/2022 demonstrated mild ductal ectasia without any additional abnormalities and therefore further evaluation with breast MRI was recommended.   EXAM: BILATERAL BREAST MRI WITH AND WITHOUT CONTRAST   TECHNIQUE: Multiplanar, multisequence MR images of both breasts were obtained prior to and following the intravenous administration of 9 ml of Gadavist   Three-dimensional MR images were rendered by post-processing of the original MR data on an independent workstation. The three-dimensional MR images were interpreted, and findings are reported in the following complete MRI report for this study. Three dimensional images were evaluated at the independent interpreting workstation using the DynaCAD thin client.   COMPARISON:  Previous exams.   FINDINGS: Breast composition: a.  Almost entirely fat.   Background parenchymal enhancement: Minimal.   Right breast: No mass or abnormal  enhancement.   Left breast: There is a small 0.6 cm irregular enhancing mass in the immediate retroareolar left breast (subtraction image 98) which appears to be associated with focally dilated ducts. No additional abnormal areas of enhancement identified in the left breast.   Lymph nodes: No abnormal appearing lymph nodes.   Ancillary findings:  None.   IMPRESSION: 0 6 cm enhancing mass in the retroareolar left breast.   RECOMMENDATION: Given the immediate retroareolar location of the small enhancing mass (which may be difficult to biopsy under MRI guidance), recommend the patient return for targeted ultrasound and possible biopsy of the mass seen in the immediate subareolar right breast. If this again cannot be visualized sonographically, then attempt at MRI guided biopsy would be recommended.   BI-RADS CATEGORY  4: Suspicious.   Electronically Signed: By: Everlean Alstrom M.D. On: 01/29/2022 14:34  Diagnosis Breast, left, needle core biopsy, subareolar - INTRADUCTAL PAPILLOMA. - SEE NOTE Diagnosis Note Called to the [facility] on 01/17/2022. Claudette Laws MD Pathologist, Electronic Signature (Case signed 02/16/2022)  Assessment and Plan:  Diagnoses and all orders for this visit:  Intraductal papilloma of breast, left  Bloody discharge from left nipple     Left breast radioactive seed localized excisional biopsy.  The surgical procedure has been discussed with the patient.  Potential risks, benefits, alternative treatments, and expected outcomes have been explained.  All of the patient's questions at this time have been answered.  The likelihood of reaching the patient's treatment goal is good.  The patient understand the proposed surgical procedure and wishes to proceed.    Jayvion Stefanski Jearld Adjutant, MD  02/27/2022 10:06 AM

## 2022-02-27 NOTE — H&P (Signed)
Subjective   Chief Complaint: Breast Discharge     History of Present Illness: Christina [name] is a 46 y.o. female who is seen today as an office consultation at the request of Dr. Moshe Cipro for evaluation of Breast Discharge .    This is a 46 year old female with diabetes and hypertension who presents after a her normal routine screening mammogram in September 2022.  In May of this year, she began having purulent and bloody discharge from the left nipple associated with some discomfort.  The discharge improved after a 10-day course of antibiotics prescribed by her primary care physician.  She had a left diagnostic mammogram and ultrasound in June that showed some mild duct ectasia but no sign of mass.  Subsequently, she underwent an MRI which showed a 6 mm enhancing mass in the retroareolar left breast  She underwent an attempt at an ultrasound-guided biopsy of this area which revealed only dilated ducts and focal fibrocystic changes.  She then underwent MR guided biopsy on 02/13/2022 that revealed an intraductal papilloma.  She has developed a hematoma in this area with some associated tenderness.  No further nipple discharge.  No family history of breast cancer.  She has had previous bilateral breast reductions in 1994.   Review of Systems: A complete review of systems was obtained from the patient.  I have reviewed this information and discussed as appropriate with the patient.  See HPI as well for other ROS.  Review of Systems  Constitutional: Negative.   HENT: Negative.    Eyes: Negative.   Respiratory: Negative.    Cardiovascular: Negative.   Gastrointestinal: Negative.   Genitourinary: Negative.   Musculoskeletal: Negative.   Skin: Negative.   Neurological: Negative.   Endo/Heme/Allergies: Negative.   Psychiatric/Behavioral: Negative.       Medical History: Past Medical History:  Diagnosis Date   Diabetes mellitus type 2, uncomplicated (CMS-HCC)    Hypertension      Patient Active Problem List  Diagnosis   Bipolar 1 disorder (CMS-HCC)   Bloody discharge from left nipple   Depression   Diabetes mellitus (CMS-HCC)   Essential hypertension, benign   Esophageal reflux   Family history of blood disorder   Family history of sickle cell trait   Fatty liver   Morbid obesity (CMS-HCC)    Past Surgical History:  Procedure Laterality Date   REDUCTION MAMMAPLASTY  1994   Dermoid tumor  2000   Trigger finger release Right 2016   Ring finger     Allergies  Allergen Reactions   Other Anaphylaxis    Tree nuts   Peanut Anaphylaxis   Peanut Oil Anaphylaxis   Augmentin [Amoxicillin-Pot Clavulanate] Unknown   Augmentin [Amoxicillin-Pot Clavulanate] Other (See Comments)    CRAMPS    Current Outpatient Medications on File Prior to Visit  Medication Sig Dispense Refill   levocetirizine (XYZAL) 5 MG tablet Take 1 tablet by mouth once daily     semaglutide (OZEMPIC) 1 mg/dose (2 mg/1.5 mL) pen injector Inject subcutaneously     aspirin 81 MG EC tablet Take 81 mg by mouth once daily.     HUMALOG KWIKPEN 100 unit/mL pen injector 30 units IN AM AND 16 units IN THE EVENING      HUMULIN N KWIKPEN 100 unit/mL (3 mL) pen injector 19 units AM & 14 units IN THE EVENING      loratadine (CLARITIN) 10 mg capsule Take 10 mg by mouth once daily.     metFORMIN (GLUCOPHAGE) 1000  MG tablet Take 1,000 mg by mouth 2 (two) times daily with meals.     montelukast (SINGULAIR) 10 mg tablet Take 1 tablet by mouth at bedtime     omeprazole (PRILOSEC) 20 MG DR capsule Take 20 mg by mouth once daily.     ondansetron (ZOFRAN-ODT) 4 MG disintegrating tablet      prenatal vit-iron fumarate-FA (PRENAVITE) tablet Take 1 tablet by mouth once daily.     No current facility-administered medications on file prior to visit.    Family History  Problem Relation Age of Onset   Diabetes Father    High blood pressure (Hypertension) Father    Heart failure Father      Social  History   Tobacco Use  Smoking Status Never  Smokeless Tobacco Never     Social History   Socioeconomic History   Marital status: Married  Tobacco Use   Smoking status: Never   Smokeless tobacco: Never    Objective:    Vitals:   02/27/22 0916  BP: 118/80  Pulse: 102  Temp: 37 C (98.6 F)  SpO2: 96%  Weight: 92.2 kg (203 lb 3.2 oz)  Height: 165.1 cm ('5\' 5"'$ )    Body mass index is 33.81 kg/m.  Physical Exam   Constitutional:  WDWN in NAD, conversant, no obvious deformities; lying in bed comfortably Eyes:  Pupils equal, round; sclera anicteric; moist conjunctiva; no lid lag HENT:  Oral mucosa moist; good dentition  Neck:  No masses palpated, trachea midline; no thyromegaly Lungs:  CTA bilaterally; normal respiratory effort Breasts:  symmetric, no nipple discharge; bilateral fibrocystic changes, 1.5 cm palpable firmness under the lateral edge of the left areola but no other palpable masses in the left breast.  No palpable masses on the right side; no lymphadenopathy on either side CV:  Regular rate and rhythm; no murmurs; extremities well-perfused with no edema Abd:  +bowel sounds, soft, non-tender, no palpable organomegaly; no palpable hernias Musc:  Normal gait; no apparent clubbing or cyanosis in extremities Lymphatic:  No palpable cervical or axillary lymphadenopathy Skin:  Warm, dry; no sign of jaundice Psychiatric - alert and oriented x 4; calm mood and affect   Labs, Imaging and Diagnostic Testing: CLINICAL DATA:  Spontaneous bloody left nipple discharge for 2 weeks.   EXAM: DIGITAL DIAGNOSTIC UNILATERAL LEFT MAMMOGRAM WITH TOMOSYNTHESIS AND CAD; ULTRASOUND LEFT BREAST LIMITED   TECHNIQUE: Left digital diagnostic mammography and breast tomosynthesis was performed. The images were evaluated with computer-aided detection.; Targeted ultrasound examination of the left breast was performed.   COMPARISON:  Previous exam(s).   ACR Breast Density Category b:  There are scattered areas of fibroglandular density.   FINDINGS: Mild ductal ectasia. No suspicious masses, calcifications, or distortion are identified in the left breast.   Targeted ultrasound is performed, showing mild ductal ectasia. There is some debris within the ectatic milk ducts but no discrete intraductal mass identified.   IMPRESSION: Mild ductal ectasia.  No evidence of malignancy or intraductal mass.   RECOMMENDATION: Recommend breast MRI given the pattern of left nipple discharge without a mammographic or sonographic cause identified.   I have discussed the findings and recommendations with the patient. If applicable, a reminder letter will be sent to the patient regarding the next appointment.   BI-RADS CATEGORY  2: Benign.     Electronically Signed   By: Dorise Bullion III M.D.   On: 01/14/2022 11:49  CLINICAL DATA:  45 year old female with history of spontaneous bloody left nipple discharge  initially occurring early June. Diagnostic mammography and ultrasound 01/14/2022 demonstrated mild ductal ectasia without any additional abnormalities and therefore further evaluation with breast MRI was recommended.   EXAM: BILATERAL BREAST MRI WITH AND WITHOUT CONTRAST   TECHNIQUE: Multiplanar, multisequence MR images of both breasts were obtained prior to and following the intravenous administration of 9 ml of Gadavist   Three-dimensional MR images were rendered by post-processing of the original MR data on an independent workstation. The three-dimensional MR images were interpreted, and findings are reported in the following complete MRI report for this study. Three dimensional images were evaluated at the independent interpreting workstation using the DynaCAD thin client.   COMPARISON:  Previous exams.   FINDINGS: Breast composition: a.  Almost entirely fat.   Background parenchymal enhancement: Minimal.   Right breast: No mass or abnormal  enhancement.   Left breast: There is a small 0.6 cm irregular enhancing mass in the immediate retroareolar left breast (subtraction image 98) which appears to be associated with focally dilated ducts. No additional abnormal areas of enhancement identified in the left breast.   Lymph nodes: No abnormal appearing lymph nodes.   Ancillary findings:  None.   IMPRESSION: 0 6 cm enhancing mass in the retroareolar left breast.   RECOMMENDATION: Given the immediate retroareolar location of the small enhancing mass (which may be difficult to biopsy under MRI guidance), recommend the patient return for targeted ultrasound and possible biopsy of the mass seen in the immediate subareolar right breast. If this again cannot be visualized sonographically, then attempt at MRI guided biopsy would be recommended.   BI-RADS CATEGORY  4: Suspicious.   Electronically Signed: By: Everlean Alstrom M.D. On: 01/29/2022 14:34  Diagnosis Breast, left, needle core biopsy, subareolar - INTRADUCTAL PAPILLOMA. - SEE NOTE Diagnosis Note Called to the [facility] on 01/17/2022. Claudette Laws MD Pathologist, Electronic Signature (Case signed 02/16/2022)  Assessment and Plan:  Diagnoses and all orders for this visit:  Intraductal papilloma of breast, left  Bloody discharge from left nipple     Left breast radioactive seed localized excisional biopsy.  The surgical procedure has been discussed with the patient.  Potential risks, benefits, alternative treatments, and expected outcomes have been explained.  All of the patient's questions at this time have been answered.  The likelihood of reaching the patient's treatment goal is good.  The patient understand the proposed surgical procedure and wishes to proceed.    Brigido Mera Jearld Adjutant, MD  02/27/2022 10:06 AM

## 2022-03-03 ENCOUNTER — Other Ambulatory Visit: Payer: Self-pay | Admitting: Surgery

## 2022-03-03 DIAGNOSIS — D242 Benign neoplasm of left breast: Secondary | ICD-10-CM

## 2022-03-11 ENCOUNTER — Encounter: Payer: Self-pay | Admitting: Family Medicine

## 2022-03-11 ENCOUNTER — Encounter (HOSPITAL_BASED_OUTPATIENT_CLINIC_OR_DEPARTMENT_OTHER): Payer: Self-pay | Admitting: Surgery

## 2022-03-11 ENCOUNTER — Other Ambulatory Visit: Payer: Self-pay

## 2022-03-11 ENCOUNTER — Ambulatory Visit (INDEPENDENT_AMBULATORY_CARE_PROVIDER_SITE_OTHER): Payer: BC Managed Care – PPO | Admitting: Psychology

## 2022-03-11 DIAGNOSIS — F3162 Bipolar disorder, current episode mixed, moderate: Secondary | ICD-10-CM

## 2022-03-11 NOTE — Progress Notes (Addendum)
[place] Counselor/Therapist Progress Note  Patient ID: Christina [name], MRN: [id number],    Date: 03/11/2022  Time Spent: 8:58am-9:47am  Treatment Type: Individual Therapy  Pt is seen for a virtual video visit via caregility.   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  Reported Symptoms:  Pt reports stress w/ changes at work, creating anxiety and uncertainty.    Mental Status Exam: Appearance:  NA     Behavior: Appropriate  Motor: Normal  Speech/Language:  Normal Rate  Affect: Appropriate  Mood: anxious  Thought process: normal  Thought content:   WNL  Sensory/Perceptual disturbances:   WNL  Orientation: oriented to person, place, time/date, and situation  Attention: Good  Concentration: Good  Memory: WNL  Fund of knowledge:  Good  Insight:   Good  Judgment:  Good  Impulse Control: Good   Risk Assessment: Danger to Self:  No Self-injurious Behavior: No Danger to Others: No Duty to Warn:no Physical Aggression / Violence:No   Subjective: Counselor assessed pt current functioning per pt report.  Processed w/pt emotions, stressors and coping.  Explored focus on what is in her control, focus on grounding, focus on present. Acknowledged pt has plan whether she is asked to step down or whether she accepts another job.  Pt affect blunted.  Pt reported that a lot happened in last week.  Pt scheduled for surgery to remove lump next week.  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  Pt discussed how had anticipated self care and focus on grief this month and how these unanticipated event changes things.  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  Pt believes that will be asked to step down w/ politcal makeup of board.  Pt discussed option of applying for another job that would assist to completing her Masters.  Pt acknowledge focus on utilizing coping skills.  Interventions:  Cognitive Behavioral Therapy and supportive  Diagnosis:Bipolar 1 disorder, mixed, moderate (HCC)  Plan: Pt to f/u w/ cousneling in 2 week to assist coping w/ grief and mood stability.   Pt to f/u w/ [name] as scheduled.  Treatment Plan 01/27/22   Client Abilities/Strengths  supports: family- partner and mom, friends  positives: self awareness, expressing feelings, advocating for self. Motivated, hardworking, intelligent, family centered   Client Treatment Preferences  Weekly to biweekly counseling and continue medication management [name].    Client Statement of Needs  pt "to preserve my mental health.  Not be afraid to dream big.  Be able to continue to improve. Maintain compliance with my therapy, medications, holistic approaches.  I don't want to go back to where I was in the past"   Treatment Level  outpatient counseling    Symptoms  death of father 2021/03/25.  Husband dx w/ heart failure 11/2021. Grief and anxiety. History of at least one hypomanic, manic, or mixed mood episode.Marland Kitchen History of chronic or recurrent depression for which the client has taken antidepressant medication and had outpatient treatment,     Goals 1. Begin a healthy grieving process around the loss.   Objective Begin verbalizing feelings associated with the loss. Target Date: 2023-01-28 Frequency: Daily  Progress: 50 Modality: individual  Related Interventions Assist the client in identifying and expressing feelings connected with his/her loss.   2. continue to use calming skills to assist coping w/ stress and managing mood Objective Maintain  positive self care, use of mindfulness and other grounding activities to maintain mood stability. Target Date: 2023-01-28 Frequency: Daily  Progress: 70 Modality: individual  Related Interventions Assist the client in establishing a routine pattern of daily activities such as sleeping, eating, solitary and social activities, and exercise; use of relaxation and  grounding strategies routine and assist in assessing mood.   3. Develop healthy cognitive patterns and beliefs about self and the world that lead to alleviation and help prevent the relapse of mood episodes.   Objective Identify and replace thoughts and behaviors that trigger manic or depressive symptoms. Target Date: 2023-01-28 Frequency: Daily  Progress: 60 Modality: individual  Related Interventions utilize CBT strategies to assist in challenging negative thought patterns and beliefs and ACT strategies to assist in actions, decisions and interactions consistent w/values.   Client participated in Treatment plan development and provided verbal consent     [name], [facility]

## 2022-03-13 ENCOUNTER — Encounter (HOSPITAL_BASED_OUTPATIENT_CLINIC_OR_DEPARTMENT_OTHER)
Admission: RE | Admit: 2022-03-13 | Discharge: 2022-03-13 | Disposition: A | Discharge status: Home / Self Care | Payer: BC Managed Care – PPO | Source: Ambulatory Visit | Attending: Surgery | Admitting: Surgery

## 2022-03-13 DIAGNOSIS — Z01818 Encounter for other preprocedural examination: Secondary | ICD-10-CM | POA: Diagnosis not present

## 2022-03-13 DIAGNOSIS — Z794 Long term (current) use of insulin: Secondary | ICD-10-CM | POA: Diagnosis not present

## 2022-03-13 DIAGNOSIS — E1165 Type 2 diabetes mellitus with hyperglycemia: Secondary | ICD-10-CM | POA: Diagnosis not present

## 2022-03-13 DIAGNOSIS — I10 Essential (primary) hypertension: Secondary | ICD-10-CM | POA: Insufficient documentation

## 2022-03-13 LAB — BASIC METABOLIC PANEL
Anion gap: 8 (ref 5–15)
BUN: 7 mg/dL (ref 6–20)
CO2: 25 mmol/L (ref 22–32)
Calcium: 9 mg/dL (ref 8.9–10.3)
Chloride: 107 mmol/L (ref 98–111)
Creatinine, Ser: 0.73 mg/dL (ref 0.44–1.00)
GFR, Estimated: >60 mL/min (ref >60)
Glucose, Bld: 145 mg/dL — ABNORMAL HIGH (ref 70–99)
Potassium: 4.2 mmol/L (ref 3.5–5.1)
Sodium: 140 mmol/L (ref 135–145)

## 2022-03-13 NOTE — Progress Notes (Signed)

## 2022-03-16 ENCOUNTER — Telehealth: Payer: Self-pay | Admitting: Family Medicine

## 2022-03-16 NOTE — Telephone Encounter (Signed)
FMLA paperwork. Patient sent in paperwork via [employer].     (Noted/copied/sleeved)

## 2022-03-17 ENCOUNTER — Ambulatory Visit
Admission: RE | Admit: 2022-03-17 | Discharge: 2022-03-17 | Disposition: A | Discharge status: Home / Self Care | Payer: BC Managed Care – PPO | Source: Ambulatory Visit | Attending: Surgery | Admitting: Surgery

## 2022-03-17 DIAGNOSIS — R928 Other abnormal and inconclusive findings on diagnostic imaging of breast: Secondary | ICD-10-CM | POA: Diagnosis not present

## 2022-03-17 DIAGNOSIS — D242 Benign neoplasm of left breast: Secondary | ICD-10-CM

## 2022-03-17 NOTE — Anesthesia Preprocedure Evaluation (Signed)
Anesthesia Evaluation  Patient identified by MRN, date of birth, ID band Patient awake    Reviewed: Allergy & Precautions, NPO status , Patient's Chart, lab work & pertinent test results  Airway Mallampati: II  TM Distance: >3 FB Neck ROM: Full    Dental no notable dental hx. (+) Teeth Intact, Dental Advisory Given   Pulmonary neg pulmonary ROS,    Pulmonary exam normal breath sounds clear to auscultation       Cardiovascular hypertension, negative cardio ROS Normal cardiovascular exam Rhythm:Regular Rate:Normal     Neuro/Psych PSYCHIATRIC DISORDERS Bipolar Disorder    GI/Hepatic negative GI ROS, Neg liver ROS, GERD  ,  Endo/Other  diabetes, Type 2  Renal/GU      Musculoskeletal negative musculoskeletal ROS (+)   Abdominal (+) + obese (BMI 33.6),   Peds  Hematology   Anesthesia Other Findings   Augmentin  Reproductive/Obstetrics                          Anesthesia Physical Anesthesia Plan  ASA: 3  Anesthesia Plan: General   Post-op Pain Management: Tylenol PO (pre-op)*, Toradol IV (intra-op)* and Dilaudid IV   Induction: Intravenous, Cricoid pressure planned and Rapid sequence  PONV Risk Score and Plan: 4 or greater and Treatment may vary due to age or medical condition, Ondansetron and Midazolam  Airway Management Planned: Oral ETT  Additional Equipment: None  Intra-op Plan:   Post-operative Plan: Extubation in OR  Informed Consent: I have reviewed the patients History and Physical, chart, labs and discussed the procedure including the risks, benefits and alternatives for the proposed anesthesia with the patient or authorized representative who has indicated his/her understanding and acceptance.     Dental advisory given  Plan Discussed with:   Anesthesia Plan Comments: (Pt on  q Week ozempic last dose 7/12)       Anesthesia Quick Evaluation

## 2022-03-18 ENCOUNTER — Ambulatory Visit (HOSPITAL_BASED_OUTPATIENT_CLINIC_OR_DEPARTMENT_OTHER): Payer: BC Managed Care – PPO | Admitting: Anesthesiology

## 2022-03-18 ENCOUNTER — Ambulatory Visit
Admission: RE | Admit: 2022-03-18 | Discharge: 2022-03-18 | Disposition: A | Discharge status: Home / Self Care | Payer: BC Managed Care – PPO | Source: Ambulatory Visit | Attending: Surgery | Admitting: Surgery

## 2022-03-18 ENCOUNTER — Other Ambulatory Visit: Payer: Self-pay

## 2022-03-18 ENCOUNTER — Encounter (HOSPITAL_BASED_OUTPATIENT_CLINIC_OR_DEPARTMENT_OTHER)
Admission: RE | Disposition: A | Discharge status: Home / Self Care | Payer: Self-pay | Source: Home / Self Care | Attending: Surgery

## 2022-03-18 ENCOUNTER — Ambulatory Visit (HOSPITAL_BASED_OUTPATIENT_CLINIC_OR_DEPARTMENT_OTHER)
Admission: RE | Admit: 2022-03-18 | Discharge: 2022-03-18 | Disposition: A | Discharge status: Home / Self Care | Payer: BC Managed Care – PPO | Attending: Surgery | Admitting: Surgery

## 2022-03-18 ENCOUNTER — Encounter (HOSPITAL_BASED_OUTPATIENT_CLINIC_OR_DEPARTMENT_OTHER): Payer: Self-pay | Admitting: Surgery

## 2022-03-18 ENCOUNTER — Ambulatory Visit: Payer: BC Managed Care – PPO | Admitting: Psychology

## 2022-03-18 DIAGNOSIS — D242 Benign neoplasm of left breast: Secondary | ICD-10-CM | POA: Insufficient documentation

## 2022-03-18 DIAGNOSIS — R928 Other abnormal and inconclusive findings on diagnostic imaging of breast: Secondary | ICD-10-CM | POA: Diagnosis not present

## 2022-03-18 DIAGNOSIS — N641 Fat necrosis of breast: Secondary | ICD-10-CM | POA: Insufficient documentation

## 2022-03-18 DIAGNOSIS — E119 Type 2 diabetes mellitus without complications: Secondary | ICD-10-CM | POA: Diagnosis not present

## 2022-03-18 DIAGNOSIS — Z7984 Long term (current) use of oral hypoglycemic drugs: Secondary | ICD-10-CM | POA: Insufficient documentation

## 2022-03-18 DIAGNOSIS — Z419 Encounter for procedure for purposes other than remedying health state, unspecified: Secondary | ICD-10-CM

## 2022-03-18 DIAGNOSIS — N6042 Mammary duct ectasia of left breast: Secondary | ICD-10-CM | POA: Insufficient documentation

## 2022-03-18 DIAGNOSIS — Z7985 Long-term (current) use of injectable non-insulin antidiabetic drugs: Secondary | ICD-10-CM | POA: Diagnosis not present

## 2022-03-18 DIAGNOSIS — Z9889 Other specified postprocedural states: Secondary | ICD-10-CM | POA: Diagnosis not present

## 2022-03-18 DIAGNOSIS — F319 Bipolar disorder, unspecified: Secondary | ICD-10-CM | POA: Diagnosis not present

## 2022-03-18 DIAGNOSIS — Z794 Long term (current) use of insulin: Secondary | ICD-10-CM | POA: Insufficient documentation

## 2022-03-18 DIAGNOSIS — N6022 Fibroadenosis of left breast: Secondary | ICD-10-CM | POA: Diagnosis not present

## 2022-03-18 DIAGNOSIS — E1165 Type 2 diabetes mellitus with hyperglycemia: Secondary | ICD-10-CM

## 2022-03-18 DIAGNOSIS — N6452 Nipple discharge: Secondary | ICD-10-CM | POA: Diagnosis not present

## 2022-03-18 DIAGNOSIS — I10 Essential (primary) hypertension: Secondary | ICD-10-CM | POA: Insufficient documentation

## 2022-03-18 DIAGNOSIS — K219 Gastro-esophageal reflux disease without esophagitis: Secondary | ICD-10-CM | POA: Diagnosis not present

## 2022-03-18 HISTORY — PX: BREAST EXCISIONAL BIOPSY: SUR124

## 2022-03-18 HISTORY — PX: RADIOACTIVE SEED GUIDED EXCISIONAL BREAST BIOPSY: SHX6490

## 2022-03-18 LAB — GLUCOSE, CAPILLARY
Glucose-Capillary: 113 mg/dL — ABNORMAL HIGH (ref 70–99)
Glucose-Capillary: 90 mg/dL (ref 70–99)

## 2022-03-18 LAB — POCT PREGNANCY, URINE: Preg Test, Ur: NEGATIVE

## 2022-03-18 SURGERY — RADIOACTIVE SEED GUIDED BREAST BIOPSY
Anesthesia: General | Site: Breast | Laterality: Left

## 2022-03-18 MED ORDER — DEXMEDETOMIDINE HCL IN NACL 80 MCG/20ML IV SOLN
INTRAVENOUS | Status: AC
  Filled 2022-03-18: qty 20

## 2022-03-18 MED ORDER — LIDOCAINE 2% (20 MG/ML) 5 ML SYRINGE
INTRAMUSCULAR | Status: DC | PRN
  Administered 2022-03-18: 100 mg via INTRAVENOUS

## 2022-03-18 MED ORDER — ACETAMINOPHEN 500 MG PO TABS
1000.0000 mg | ORAL_TABLET | ORAL | Status: AC
  Administered 2022-03-18: 1000 mg via ORAL

## 2022-03-18 MED ORDER — CHLORHEXIDINE GLUCONATE CLOTH 2 % EX PADS: 6.0000 | MEDICATED_PAD | Freq: Once | CUTANEOUS | Status: DC

## 2022-03-18 MED ORDER — SUCCINYLCHOLINE CHLORIDE 200 MG/10ML IV SOSY
PREFILLED_SYRINGE | INTRAVENOUS | Status: AC
  Filled 2022-03-18: qty 10

## 2022-03-18 MED ORDER — HYDROMORPHONE HCL 1 MG/ML IJ SOLN: 0.2500 mg | INTRAMUSCULAR | Status: DC | PRN

## 2022-03-18 MED ORDER — KETOROLAC TROMETHAMINE 30 MG/ML IJ SOLN: 30.0000 mg | Freq: Once | INTRAMUSCULAR | Status: DC | PRN

## 2022-03-18 MED ORDER — DEXAMETHASONE SODIUM PHOSPHATE 10 MG/ML IJ SOLN
INTRAMUSCULAR | Status: AC
  Filled 2022-03-18: qty 1

## 2022-03-18 MED ORDER — OXYCODONE HCL 5 MG PO TABS: 5.0000 mg | ORAL_TABLET | Freq: Once | ORAL | Status: DC | PRN

## 2022-03-18 MED ORDER — ACETAMINOPHEN 500 MG PO TABS
ORAL_TABLET | ORAL | Status: AC
  Filled 2022-03-18: qty 2

## 2022-03-18 MED ORDER — FENTANYL CITRATE (PF) 100 MCG/2ML IJ SOLN
INTRAMUSCULAR | Status: AC
  Filled 2022-03-18: qty 2

## 2022-03-18 MED ORDER — MIDAZOLAM HCL 2 MG/2ML IJ SOLN
INTRAMUSCULAR | Status: DC | PRN
  Administered 2022-03-18: 2 mg via INTRAVENOUS

## 2022-03-18 MED ORDER — KETOROLAC TROMETHAMINE 30 MG/ML IJ SOLN
INTRAMUSCULAR | Status: DC | PRN
  Administered 2022-03-18: 30 mg via INTRAVENOUS

## 2022-03-18 MED ORDER — ROCURONIUM BROMIDE 10 MG/ML (PF) SYRINGE
PREFILLED_SYRINGE | INTRAVENOUS | Status: AC
  Filled 2022-03-18: qty 40

## 2022-03-18 MED ORDER — CHLORHEXIDINE GLUCONATE CLOTH 2 % EX PADS
6.0000 | MEDICATED_PAD | Freq: Once | CUTANEOUS | Status: AC
  Administered 2022-03-18: 6 via TOPICAL

## 2022-03-18 MED ORDER — DEXAMETHASONE SODIUM PHOSPHATE 10 MG/ML IJ SOLN
INTRAMUSCULAR | Status: DC | PRN
  Administered 2022-03-18: 5 mg via INTRAVENOUS

## 2022-03-18 MED ORDER — SUCCINYLCHOLINE CHLORIDE 200 MG/10ML IV SOSY
PREFILLED_SYRINGE | INTRAVENOUS | Status: DC | PRN
  Administered 2022-03-18: 100 mg via INTRAVENOUS

## 2022-03-18 MED ORDER — LACTATED RINGERS IV SOLN
INTRAVENOUS | Status: DC
Start: 2022-03-18 — End: 2022-03-18

## 2022-03-18 MED ORDER — SUGAMMADEX SODIUM 200 MG/2ML IV SOLN
INTRAVENOUS | Status: DC | PRN
  Administered 2022-03-18: 200 mg via INTRAVENOUS

## 2022-03-18 MED ORDER — ONDANSETRON HCL 4 MG/2ML IJ SOLN: 4.0000 mg | Freq: Once | INTRAMUSCULAR | Status: DC | PRN

## 2022-03-18 MED ORDER — PROPOFOL 500 MG/50ML IV EMUL
INTRAVENOUS | Status: AC
  Filled 2022-03-18: qty 150

## 2022-03-18 MED ORDER — CEFAZOLIN SODIUM-DEXTROSE 2-4 GM/100ML-% IV SOLN
INTRAVENOUS | Status: AC
  Filled 2022-03-18: qty 100

## 2022-03-18 MED ORDER — KETOROLAC TROMETHAMINE 30 MG/ML IJ SOLN
INTRAMUSCULAR | Status: AC
  Filled 2022-03-18: qty 1

## 2022-03-18 MED ORDER — ONDANSETRON HCL 4 MG/2ML IJ SOLN
INTRAMUSCULAR | Status: DC | PRN
  Administered 2022-03-18: 4 mg via INTRAVENOUS

## 2022-03-18 MED ORDER — ONDANSETRON HCL 4 MG/2ML IJ SOLN
INTRAMUSCULAR | Status: AC
  Filled 2022-03-18: qty 6

## 2022-03-18 MED ORDER — ROCURONIUM BROMIDE 10 MG/ML (PF) SYRINGE
PREFILLED_SYRINGE | INTRAVENOUS | Status: DC | PRN
  Administered 2022-03-18: 30 mg via INTRAVENOUS

## 2022-03-18 MED ORDER — LIDOCAINE 2% (20 MG/ML) 5 ML SYRINGE
INTRAMUSCULAR | Status: AC
  Filled 2022-03-18: qty 10

## 2022-03-18 MED ORDER — CEFAZOLIN SODIUM-DEXTROSE 2-4 GM/100ML-% IV SOLN
2.0000 g | INTRAVENOUS | Status: AC
  Administered 2022-03-18: 2 g via INTRAVENOUS

## 2022-03-18 MED ORDER — PROPOFOL 10 MG/ML IV BOLUS
INTRAVENOUS | Status: DC | PRN
  Administered 2022-03-18: 160 mg via INTRAVENOUS

## 2022-03-18 MED ORDER — OXYCODONE HCL 5 MG/5ML PO SOLN: 5.0000 mg | Freq: Once | ORAL | Status: DC | PRN

## 2022-03-18 MED ORDER — MIDAZOLAM HCL 2 MG/2ML IJ SOLN
INTRAMUSCULAR | Status: AC
  Filled 2022-03-18: qty 2

## 2022-03-18 MED ORDER — BUPIVACAINE-EPINEPHRINE 0.25% -1:200000 IJ SOLN
INTRAMUSCULAR | Status: DC | PRN
  Administered 2022-03-18: 10 mL

## 2022-03-18 MED ORDER — FENTANYL CITRATE (PF) 250 MCG/5ML IJ SOLN
INTRAMUSCULAR | Status: DC | PRN
Start: 2022-03-18 — End: 2022-03-18
  Administered 2022-03-18: 100 ug via INTRAVENOUS

## 2022-03-18 SURGICAL SUPPLY — 44 items
APL PRP STRL LF DISP 70% ISPRP (MISCELLANEOUS) ×1
APL SKNCLS STERI-STRIP NONHPOA (GAUZE/BANDAGES/DRESSINGS) ×1
APPLIER CLIP 9.375 MED OPEN (MISCELLANEOUS)
APR CLP MED 9.3 20 MLT OPN (MISCELLANEOUS)
BENZOIN TINCTURE PRP APPL 2/3 (GAUZE/BANDAGES/DRESSINGS) ×2 IMPLANT
BLADE HEX COATED 2.75 (ELECTRODE) ×2 IMPLANT
BLADE SURG 15 STRL LF DISP TIS (BLADE) ×1 IMPLANT
BLADE SURG 15 STRL SS (BLADE) ×2
CANISTER SUCT 1200ML W/VALVE (MISCELLANEOUS) ×2 IMPLANT
CHLORAPREP W/TINT 26 (MISCELLANEOUS) ×2 IMPLANT
CLIP APPLIE 9.375 MED OPEN (MISCELLANEOUS) IMPLANT
COVER BACK TABLE 60X90IN (DRAPES) ×2 IMPLANT
COVER MAYO STAND STRL (DRAPES) ×2 IMPLANT
COVER PROBE W GEL 5X96 (DRAPES) ×2 IMPLANT
DRAPE LAPAROTOMY 100X72 PEDS (DRAPES) ×2 IMPLANT
DRAPE UTILITY XL STRL (DRAPES) ×2 IMPLANT
DRSG TEGADERM 4X4.75 (GAUZE/BANDAGES/DRESSINGS) ×2 IMPLANT
ELECT REM PT RETURN 9FT ADLT (ELECTROSURGICAL) ×2
ELECTRODE REM PT RTRN 9FT ADLT (ELECTROSURGICAL) ×1 IMPLANT
GAUZE SPONGE 4X4 12PLY STRL LF (GAUZE/BANDAGES/DRESSINGS) ×2 IMPLANT
GLOVE BIO SURGEON STRL SZ7 (GLOVE) ×2 IMPLANT
GLOVE BIOGEL PI IND STRL 7.5 (GLOVE) ×1 IMPLANT
GLOVE BIOGEL PI INDICATOR 7.5 (GLOVE) ×1
GOWN STRL REUS W/ TWL LRG LVL3 (GOWN DISPOSABLE) ×2 IMPLANT
GOWN STRL REUS W/TWL LRG LVL3 (GOWN DISPOSABLE) ×4
KIT MARKER MARGIN INK (KITS) ×2 IMPLANT
NDL HYPO 25X1 1.5 SAFETY (NEEDLE) ×1 IMPLANT
NEEDLE HYPO 25X1 1.5 SAFETY (NEEDLE) ×2 IMPLANT
NS IRRIG 1000ML POUR BTL (IV SOLUTION) ×2 IMPLANT
PACK BASIN DAY SURGERY FS (CUSTOM PROCEDURE TRAY) ×2 IMPLANT
PENCIL SMOKE EVACUATOR (MISCELLANEOUS) ×2 IMPLANT
SLEEVE SCD COMPRESS KNEE MED (STOCKING) ×2 IMPLANT
SPONGE GAUZE 2X2 8PLY STRL LF (GAUZE/BANDAGES/DRESSINGS) IMPLANT
SPONGE T-LAP 4X18 ~~LOC~~+RFID (SPONGE) ×2 IMPLANT
STRIP CLOSURE SKIN 1/2X4 (GAUZE/BANDAGES/DRESSINGS) ×2 IMPLANT
SUT MON AB 4-0 PC3 18 (SUTURE) ×2 IMPLANT
SUT SILK 2 0 SH (SUTURE) IMPLANT
SUT VIC AB 3-0 SH 27 (SUTURE) ×2
SUT VIC AB 3-0 SH 27X BRD (SUTURE) ×1 IMPLANT
SYR CONTROL 10ML LL (SYRINGE) ×2 IMPLANT
TOWEL GREEN STERILE FF (TOWEL DISPOSABLE) ×2 IMPLANT
TRAY FAXITRON CT DISP (TRAY / TRAY PROCEDURE) ×2 IMPLANT
TUBE CONNECTING 20X1/4 (TUBING) ×2 IMPLANT
YANKAUER SUCT BULB TIP NO VENT (SUCTIONS) ×2 IMPLANT

## 2022-03-18 NOTE — Op Note (Signed)
Pre-op Diagnosis:  Left Post-op Diagnosis: same Procedure:  Left radioactive seed localized lumpectomy Surgeon:  Leeta Grimme K. Anesthesia:  GEN - LMA Indications:  This is a 46 year old female with diabetes and hypertension who presents after a her normal routine screening mammogram in September 2022.  In May of this year, she began having purulent and bloody discharge from the left nipple associated with some discomfort.  The discharge improved after a 10-day course of antibiotics prescribed by her primary care physician.  She had a left diagnostic mammogram and ultrasound in June that showed some mild duct ectasia but no sign of mass.  Subsequently, she underwent an MRI which showed a 6 mm enhancing mass in the retroareolar left breast  She underwent an attempt at an ultrasound-guided biopsy of this area which revealed only dilated ducts and focal fibrocystic changes.  She then underwent MR guided biopsy on 02/13/2022 that revealed an intraductal papilloma.  She has developed a hematoma in this area with some associated tenderness.  No further nipple discharge.   No family history of breast cancer.  She has had previous bilateral breast reductions in 1994. Description of procedure: The patient is brought to the operating room placed in supine position on the operating room table. After an adequate level of general anesthesia was obtained, her left breast was prepped with ChloraPrep and draped in sterile fashion. A timeout was taken to ensure the proper patient and proper procedure. We interrogated the breast with the neoprobe. We made a circumareolar incision around the lateral side of the nipple using part of her old circumareolar incision after infiltrating with 0.25% Marcaine. Dissection was carried down in the breast tissue with cautery.  We dissected medially behind the nipple.  We used the neoprobe to guide [place] towards the radioactive seed. We excised an area of tissue around the radioactive seed  1.5 cm in diameter. The specimen was removed and was oriented with a paint kit. Specimen mammogram showed the radioactive seed within the specimen.  However, the biopsy clip was not identified.  We took a small additional medial margin which also did not reveal the biopsy clip.  I used a scalpel to sharply take a small amount of anterior margin, shaving it off the posterior surface of the nipple.  Specimen mammogram revealed the biopsy clip within this anterior margin.  These were all sent for pathologic examination. There is no residual radioactivity within the biopsy cavity. We inspected carefully for hemostasis. The wound was thoroughly irrigated. The wound was closed with a deep layer of 3-0 Vicryl and a subcuticular layer of 4-0 Monocryl. Benzoin Steri-Strips were applied. The patient was then extubated and brought to the recovery room in stable condition. All sponge, instrument, and needle counts are correct.  Imogene Burn. Georgette Dover, MD, [facility]  General/ Trauma Surgery  03/18/2022 1:15 PM

## 2022-03-18 NOTE — Anesthesia Procedure Notes (Signed)
Procedure Name: Intubation Date/Time: 03/18/2022 12:26 PM  Performed by: Imagene Riches, CRNAPre-anesthesia Checklist: Patient identified, Emergency Drugs available, Suction available and Patient being monitored Patient Re-evaluated:Patient Re-evaluated prior to induction Oxygen Delivery Method: Circle System Utilized Preoxygenation: Pre-oxygenation with 100% oxygen Induction Type: IV induction, Rapid sequence and Cricoid Pressure applied Laryngoscope Size: Miller and 2 Grade View: Grade I Tube type: Oral Tube size: 7.0 mm Number of attempts: 1 Airway Equipment and Method: Stylet and Oral airway Placement Confirmation: ETT inserted through vocal cords under direct vision, positive ETCO2 and breath sounds checked- equal and bilateral Secured at: 21 cm Tube secured with: Tape Dental Injury: Teeth and Oropharynx as per pre-operative assessment

## 2022-03-18 NOTE — Interval H&P Note (Signed)
History and Physical Interval Note:  03/18/2022 12:01 PM  Christina [name]  has presented today for surgery, with the diagnosis of LEFT BREAST INTRADUCTAL PAPILLOMA.  The various methods of treatment have been discussed with the patient and family. After consideration of risks, benefits and other options for treatment, the patient has consented to  Procedure(s): LEFT BREAST RADIOACTIVE SEED LOCALIZED EXCISIONAL BIOPSY (Left) as a surgical intervention.  The patient's history has been reviewed, patient examined, no change in status, stable for surgery.  I have reviewed the patient's chart and labs.  Questions were answered to the patient's satisfaction.     Maia Petties

## 2022-03-18 NOTE — Transfer of Care (Signed)
Immediate Anesthesia Transfer of Care Note  Patient: Christina [name]  Procedure(s) Performed: LEFT BREAST RADIOACTIVE SEED LOCALIZED EXCISIONAL BIOPSY (Left: Breast)  Patient Location: PACU  Anesthesia Type:General  Level of Consciousness: sedated  Airway & Oxygen Therapy: Patient Spontanous Breathing and Patient connected to nasal cannula oxygen  Post-op Assessment: Report given to RN and Post -op Vital signs reviewed and stable  Post vital signs: Reviewed and stable  Last Vitals:  Vitals Value Taken Time  BP 142/102 03/18/22 1320  Temp    Pulse 89 03/18/22 1321  Resp 17 03/18/22 1321  SpO2 98 % 03/18/22 1321  Vitals shown include unvalidated device data.  Last Pain:  Vitals:   03/18/22 1132  TempSrc: Oral  PainSc: 0-No pain      Patients Stated Pain Goal: 4 (45/85/92 9244)  Complications: No notable events documented.

## 2022-03-18 NOTE — Anesthesia Postprocedure Evaluation (Signed)
Anesthesia Post Note  Patient: Christina [name]  Procedure(s) Performed: LEFT BREAST RADIOACTIVE SEED LOCALIZED EXCISIONAL BIOPSY (Left: Breast)     Patient location during evaluation: PACU Anesthesia Type: General Level of consciousness: awake and alert Pain management: pain level controlled Vital Signs Assessment: post-procedure vital signs reviewed and stable Respiratory status: spontaneous breathing, nonlabored ventilation, respiratory function stable and patient connected to nasal cannula oxygen Cardiovascular status: blood pressure returned to baseline and stable Postop Assessment: no apparent nausea or vomiting Anesthetic complications: no   No notable events documented.  Last Vitals:  Vitals:   03/18/22 1400 03/18/22 1415  BP: (!) 134/91 (!) 140/94  Pulse: 82 82  Resp: 14 15  Temp:    SpO2: 97% 96%    Last Pain:  Vitals:   03/18/22 1415  TempSrc:   PainSc: 3                  Barnet Glasgow

## 2022-03-18 NOTE — Discharge Instructions (Addendum)
[place] Office Phone Number [phone]  BREAST BIOPSY/ PARTIAL MASTECTOMY: POST OP INSTRUCTIONS  Always review your discharge instruction sheet given to you by the facility where your surgery was performed.  IF YOU HAVE DISABILITY OR FAMILY LEAVE FORMS, YOU MUST BRING THEM TO THE OFFICE FOR PROCESSING.  DO NOT GIVE THEM TO YOUR DOCTOR.  A prescription for pain medication may be given to you upon discharge.  Take your pain medication as prescribed, if needed.  If narcotic pain medicine is not needed, then you may take acetaminophen (Tylenol) or ibuprofen (Advil) as needed. Take your usually prescribed medications unless otherwise directed If you need a refill on your pain medication, please contact your pharmacy.  They will contact our office to request authorization.  Prescriptions will not be filled after 5pm or on week-ends. You should eat very light the first 24 hours after surgery, such as soup, crackers, pudding, etc.  Resume your normal diet the day after surgery. Most patients will experience some swelling and bruising in the breast.  Ice packs and a good support bra will help.  Swelling and bruising can take several days to resolve.  It is common to experience some constipation if taking pain medication after surgery.  Increasing fluid intake and taking a stool softener will usually help or prevent this problem from occurring.  A mild laxative (Milk of Magnesia or Miralax) should be taken according to package directions if there are no bowel movements after 48 hours. Unless discharge instructions indicate otherwise, you may remove your bandages 24-48 hours after surgery, and you may shower at that time.  You may have steri-strips (small skin tapes) in place directly over the incision.  These strips should be left on the skin for 7-10 days.  If your surgeon used skin glue on the incision, you may shower in 24 hours.  The glue will flake off over the next 2-3 weeks.  Any  sutures or staples will be removed at the office during your follow-up visit. ACTIVITIES:  You may resume regular daily activities (gradually increasing) beginning the next day.  Wearing a good support bra or sports bra minimizes pain and swelling.  You may have sexual intercourse when it is comfortable. You may drive when you no longer are taking prescription pain medication, you can comfortably wear a seatbelt, and you can safely maneuver your car and apply brakes. RETURN TO WORK:  ______________________________________________________________________________________ Dennis Bast should see your doctor in the office for a follow-up appointment approximately two weeks after your surgery.  Your doctor's nurse will typically make your follow-up appointment when she calls you with your pathology report.  Expect your pathology report 2-3 business days after your surgery.  You may call to check if you do not hear from [place] after three days. OTHER INSTRUCTIONS: _______________________________________________________________________________________________ _____________________________________________________________________________________________________________________________________ _____________________________________________________________________________________________________________________________________ _____________________________________________________________________________________________________________________________________  WHEN TO CALL YOUR DOCTOR: Fever over 101.0 Nausea and/or vomiting. Extreme swelling or bruising. Continued bleeding from incision. Increased pain, redness, or drainage from the incision.  The clinic staff is available to answer your questions during regular business hours.  Please don't hesitate to call and ask to speak to one of the nurses for clinical concerns.  If you have a medical emergency, go to the nearest emergency room or call 911.  A surgeon from [facility] is always on call at the hospital.  For further questions, please visit [email or web address]    No Tylenol before 6:00pm if needed. No ibuprofen before 7:00pm if needed.  Post Anesthesia  Home Care Instructions  Activity: Get plenty of rest for the remainder of the day. A responsible individual must stay with you for 24 hours following the procedure.  For the next 24 hours, DO NOT: -Drive a car -Paediatric nurse -Drink alcoholic beverages -Take any medication unless instructed by your physician -Make any legal decisions or sign important papers.  Meals: Start with liquid foods such as gelatin or soup. Progress to regular foods as tolerated. Avoid greasy, spicy, heavy foods. If nausea and/or vomiting occur, drink only clear liquids until the nausea and/or vomiting subsides. Call your physician if vomiting continues.  Special Instructions/Symptoms: Your throat may feel dry or sore from the anesthesia or the breathing tube placed in your throat during surgery. If this causes discomfort, gargle with warm salt water. The discomfort should disappear within 24 hours.  If you had a scopolamine patch placed behind your ear for the management of post- operative nausea and/or vomiting:  1. The medication in the patch is effective for 72 hours, after which it should be removed.  Wrap patch in a tissue and discard in the trash. Wash hands thoroughly with soap and water. 2. You may remove the patch earlier than 72 hours if you experience unpleasant side effects which may include dry mouth, dizziness or visual disturbances. 3. Avoid touching the patch. Wash your hands with soap and water after contact with the patch.

## 2022-03-19 ENCOUNTER — Encounter (HOSPITAL_BASED_OUTPATIENT_CLINIC_OR_DEPARTMENT_OTHER): Payer: Self-pay | Admitting: Surgery

## 2022-03-20 DIAGNOSIS — Z0279 Encounter for issue of other medical certificate: Secondary | ICD-10-CM

## 2022-03-20 LAB — SURGICAL PATHOLOGY

## 2022-03-25 ENCOUNTER — Ambulatory Visit (INDEPENDENT_AMBULATORY_CARE_PROVIDER_SITE_OTHER): Payer: BC Managed Care – PPO | Admitting: Psychology

## 2022-03-25 DIAGNOSIS — F319 Bipolar disorder, unspecified: Secondary | ICD-10-CM | POA: Diagnosis not present

## 2022-03-25 NOTE — Progress Notes (Signed)
[facility] Counselor/Therapist Progress Note  Patient ID: Christina [name], MRN: [id number],    Date: 03/25/2022  Time Spent: 10:01am-10:45am  Treatment Type: Individual Therapy  Pt is seen for a virtual audio visit via caregility.  Pt couldn't access video due to poor Internet connection.  pt joins from her home and counselor from her home office.  Reported Symptoms:  Pt reports grief and some difficulty sleeping.   Mental Status Exam: Appearance:  NA     Behavior: Appropriate  Motor: Normal  Speech/Language:  Normal Rate  Affect: Appropriate  Mood: sad  Thought process: normal  Thought content:   WNL  Sensory/Perceptual disturbances:   WNL  Orientation: oriented to person, place, time/date, and situation  Attention: Good  Concentration: Good  Memory: WNL  Fund of knowledge:  Good  Insight:   Good  Judgment:  Good  Impulse Control: Good   Risk Assessment: Danger to Self:  No Self-injurious Behavior: No Danger to Others: No Duty to Warn:no Physical Aggression / Violence:No   Subjective: Counselor assessed pt current functioning per pt report.  Processed w/pt emotions and grief.  Discussed how this feels this year repeat from last. Validated and normalized grieving and disengaging at times.  Reiterated grounding for coping and recognizing ways of slowing down.    Pt affect wnl.  Pt reported that her surgery went well and that her PCP approved FMLA until 04/07/22.  Pt discussed grief w/ anniversary of her father's death and same routine of prepping kids for school following.   Pt reported she had a day that didn't engage much and gave space to do so.  Pt reported that has had difficulty sleeping past couple of nights.  Pt recognized need to monitor not engaging in too much and allowing for grounding each day.    Interventions: Cognitive Behavioral Therapy and supportive  Diagnosis:Bipolar I disorder ([place])  Plan: Pt to f/u w/ cousneling in 1 week to assist  coping w/ grief and mood stability.   Pt to f/u w/ [name] as scheduled.  Treatment Plan 01/27/22   Client Abilities/Strengths  supports: family- partner and mom, friends  positives: self awareness, expressing feelings, advocating for self. Motivated, hardworking, intelligent, family centered   Client Treatment Preferences  Weekly to biweekly counseling and continue medication management [name].    Client Statement of Needs  pt "to preserve my mental health.  Not be afraid to dream big.  Be able to continue to improve. Maintain compliance with my therapy, medications, holistic approaches.  I don't want to go back to where I was in the past"   Treatment Level  outpatient counseling    Symptoms  death of father 2021/04/09.  Husband dx w/ heart failure 11/2021. Grief and anxiety. History of at least one hypomanic, manic, or mixed mood episode.Marland Kitchen History of chronic or recurrent depression for which the client has taken antidepressant medication and had outpatient treatment,     Goals 1. Begin a healthy grieving process around the loss.   Objective Begin verbalizing feelings associated with the loss. Target Date: 2023-01-28 Frequency: Daily  Progress: 50 Modality: individual  Related Interventions Assist the client in identifying and expressing feelings connected with his/her loss.   2. continue to use calming skills to assist coping w/ stress and managing mood Objective Maintain positive self care, use of mindfulness and other grounding activities to maintain mood stability. Target Date: 2023-01-28 Frequency: Daily  Progress: 70 Modality: individual  Related Interventions  Assist the client in establishing a routine pattern of daily activities such as sleeping, eating, solitary and social activities, and exercise; use of relaxation and grounding strategies routine and assist in assessing mood.   3. Develop healthy cognitive patterns and beliefs about self and the world that lead to  alleviation and help prevent the relapse of mood episodes.   Objective Identify and replace thoughts and behaviors that trigger manic or depressive symptoms. Target Date: 2023-01-28 Frequency: Daily  Progress: 60 Modality: individual  Related Interventions utilize CBT strategies to assist in challenging negative thought patterns and beliefs and ACT strategies to assist in actions, decisions and interactions consistent w/values.   Client participated in Treatment plan development and provided verbal consent    Christina [name], [facility]

## 2022-04-01 ENCOUNTER — Ambulatory Visit (INDEPENDENT_AMBULATORY_CARE_PROVIDER_SITE_OTHER): Payer: BC Managed Care – PPO | Admitting: Psychology

## 2022-04-01 DIAGNOSIS — F319 Bipolar disorder, unspecified: Secondary | ICD-10-CM | POA: Diagnosis not present

## 2022-04-01 NOTE — Progress Notes (Signed)
[facility] Counselor/Therapist Progress Note  Patient ID: Christina [name], MRN: [id number],    Date: 04/01/2022  Time Spent: 06:26RS-85:46EV  Treatment Type: Individual Therapy  Pt is seen for a virtual audio visit via caregility.  Pt couldn't access video due to poor Internet connection.  pt joins from her home and counselor from her home office.  Reported Symptoms:  Pt reports sleep is improving, Pt reports some sad mood/grief, Pt reports engaged w/ family and activities.   Mental Status Exam: Appearance:  NA     Behavior: Appropriate  Motor: Normal  Speech/Language:  Normal Rate  Affect: Appropriate  Mood: sad  Thought process: normal  Thought content:   WNL  Sensory/Perceptual disturbances:   WNL  Orientation: oriented to person, place, time/date, and situation  Attention: Good  Concentration: Good  Memory: WNL  Fund of knowledge:  Good  Insight:   Good  Judgment:  Good  Impulse Control: Good   Risk Assessment: Danger to Self:  No Self-injurious Behavior: No Danger to Others: No Duty to Warn:no Physical Aggression / Violence:No   Subjective: Counselor assessed pt current functioning per pt report.  Processed w/pt emotions, grief, transition w/ kids return to school and anniversary of father's funeral.  Validated and normalized grieving.  Reflected how positives of being able to slow down on stressors and ways she has been able to engage.  Explored work/life/family balance.  Pt affect wnl.  Pt reported that she has been sleeping better, pt reported that she has had sad days and also has been able to stay engaged.  Pt discussed transition w/ kids return to school.  Pt discussed opportunities w/ son's treament and going for assessment this week.  Pt reported on exploring ways to finish master's and have balance needed between self, family and profession.   Interventions: Cognitive Behavioral Therapy and supportive  Diagnosis:Bipolar I disorder  ([place])  Plan: Pt to f/u w/ cousneling in 2 week to assist coping w/ grief and mood stability.   Pt to f/u w/ [name] as scheduled.  Treatment Plan 01/27/22   Client Abilities/Strengths  supports: family- partner and mom, friends  positives: self awareness, expressing feelings, advocating for self. Motivated, hardworking, intelligent, family centered   Client Treatment Preferences  Weekly to biweekly counseling and continue medication management [name].    Client Statement of Needs  pt "to preserve my mental health.  Not be afraid to dream big.  Be able to continue to improve. Maintain compliance with my therapy, medications, holistic approaches.  I don't want to go back to where I was in the past"   Treatment Level  outpatient counseling    Symptoms  death of father Apr 07, 2021.  Husband dx w/ heart failure 11/2021. Grief and anxiety. History of at least one hypomanic, manic, or mixed mood episode.Marland Kitchen History of chronic or recurrent depression for which the client has taken antidepressant medication and had outpatient treatment,     Goals 1. Begin a healthy grieving process around the loss.   Objective Begin verbalizing feelings associated with the loss. Target Date: 2023-01-28 Frequency: Daily  Progress: 50 Modality: individual  Related Interventions Assist the client in identifying and expressing feelings connected with his/her loss.   2. continue to use calming skills to assist coping w/ stress and managing mood Objective Maintain positive self care, use of mindfulness and other grounding activities to maintain mood stability. Target Date: 2023-01-28 Frequency: Daily  Progress: 70 Modality: individual  Related Interventions  Assist the client in establishing a routine pattern of daily activities such as sleeping, eating, solitary and social activities, and exercise; use of relaxation and grounding strategies routine and assist in assessing mood.   3. Develop healthy cognitive  patterns and beliefs about self and the world that lead to alleviation and help prevent the relapse of mood episodes.   Objective Identify and replace thoughts and behaviors that trigger manic or depressive symptoms. Target Date: 2023-01-28 Frequency: Daily  Progress: 60 Modality: individual  Related Interventions utilize CBT strategies to assist in challenging negative thought patterns and beliefs and ACT strategies to assist in actions, decisions and interactions consistent w/values.   Client participated in Treatment plan development and provided verbal consent     [name], [facility]

## 2022-04-02 ENCOUNTER — Encounter: Payer: Self-pay | Admitting: Family Medicine

## 2022-04-08 ENCOUNTER — Encounter (HOSPITAL_COMMUNITY): Payer: Self-pay

## 2022-04-14 ENCOUNTER — Ambulatory Visit (INDEPENDENT_AMBULATORY_CARE_PROVIDER_SITE_OTHER): Payer: BC Managed Care – PPO | Admitting: Psychology

## 2022-04-14 DIAGNOSIS — F319 Bipolar disorder, unspecified: Secondary | ICD-10-CM

## 2022-04-14 NOTE — Progress Notes (Signed)
[facility] Counselor/Therapist Progress Note  Patient ID: Christina [name], MRN: [id number],    Date: 04/14/2022  Time Spent: 10:00am-10:43am  Treatment Type: Individual Therapy  Pt is seen for a virtual video visit via caregility.  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  Reported Symptoms:  Pt reports returned to work last week,  pt reports feeling melancholy, pt reports also feeling settled.  Mental Status Exam: Appearance:  NA     Behavior: Appropriate  Motor: Normal  Speech/Language:  Normal Rate  Affect: Appropriate  Mood: sad  Thought process: normal  Thought content:   WNL  Sensory/Perceptual disturbances:   WNL  Orientation: oriented to person, place, time/date, and situation  Attention: Good  Concentration: Good  Memory: WNL  Fund of knowledge:  Good  Insight:   Good  Judgment:  Good  Impulse Control: Good   Risk Assessment: Danger to Self:  No Self-injurious Behavior: No Danger to Others: No Duty to Warn:no Physical Aggression / Violence:No   Subjective: Counselor assessed pt current functioning per pt report.  Processed w/pt emotions, grief, and return to work. Validated and normalized grieving and related emotions.  Discussed recent interview and son's assessment for ABA center. Reflected how making decisions for self care, reducing stressors and aligning w/ values.  Pt affect wnl.  Pt reported feeling of melancholy w/ thoughts of dad and not able to reflect/share things w/ him.  Pt reported on interview and offered job.  Pt reported on next steps and how taking this job allows her to meet needs of family and finish her master's degree.  Pt acknowledged how this aligns w/ what she values and needs at this time.     Interventions: Cognitive Behavioral Therapy and supportive  Diagnosis:Bipolar I disorder ([place])  Plan: Pt to f/u w/ cousneling in 2 week to assist coping w/ grief and mood stability.   Pt to f/u  w/ [name] as scheduled.  Treatment Plan 01/27/22   Client Abilities/Strengths  supports: family- partner and mom, friends  positives: self awareness, expressing feelings, advocating for self. Motivated, hardworking, intelligent, family centered   Client Treatment Preferences  Weekly to biweekly counseling and continue medication management [name].    Client Statement of Needs  pt "to preserve my mental health.  Not be afraid to dream big.  Be able to continue to improve. Maintain compliance with my therapy, medications, holistic approaches.  I don't want to go back to where I was in the past"   Treatment Level  outpatient counseling    Symptoms  death of father 08-Apr-2021.  Husband dx w/ heart failure 11/2021. Grief and anxiety. History of at least one hypomanic, manic, or mixed mood episode.Marland Kitchen History of chronic or recurrent depression for which the client has taken antidepressant medication and had outpatient treatment,     Goals 1. Begin a healthy grieving process around the loss.   Objective Begin verbalizing feelings associated with the loss. Target Date: 2023-01-28 Frequency: Daily  Progress: 50 Modality: individual  Related Interventions Assist the client in identifying and expressing feelings connected with his/her loss.   2. continue to use calming skills to assist coping w/ stress and managing mood Objective Maintain positive self care, use of mindfulness and other grounding activities to maintain mood stability. Target Date: 2023-01-28 Frequency: Daily  Progress: 70 Modality: individual  Related Interventions Assist the client in establishing a routine pattern of daily activities such as sleeping,  eating, solitary and social activities, and exercise; use of relaxation and grounding strategies routine and assist in assessing mood.   3. Develop healthy cognitive patterns and beliefs about self and the world that lead to alleviation and help prevent the relapse of mood  episodes.   Objective Identify and replace thoughts and behaviors that trigger manic or depressive symptoms. Target Date: 2023-01-28 Frequency: Daily  Progress: 60 Modality: individual  Related Interventions utilize CBT strategies to assist in challenging negative thought patterns and beliefs and ACT strategies to assist in actions, decisions and interactions consistent w/values.   Client participated in Treatment plan development and provided verbal consent    [name], [facility]

## 2022-04-16 DIAGNOSIS — Z6833 Body mass index (BMI) 33.0-33.9, adult: Secondary | ICD-10-CM | POA: Diagnosis not present

## 2022-04-16 DIAGNOSIS — Z01419 Encounter for gynecological examination (general) (routine) without abnormal findings: Secondary | ICD-10-CM | POA: Diagnosis not present

## 2022-04-16 DIAGNOSIS — A6004 Herpesviral vulvovaginitis: Secondary | ICD-10-CM | POA: Diagnosis not present

## 2022-04-20 ENCOUNTER — Ambulatory Visit (HOSPITAL_COMMUNITY): Payer: BC Managed Care – PPO

## 2022-04-21 ENCOUNTER — Ambulatory Visit: Payer: BC Managed Care – PPO | Admitting: Psychology

## 2022-04-23 ENCOUNTER — Ambulatory Visit (HOSPITAL_COMMUNITY): Payer: BC Managed Care – PPO

## 2022-04-28 ENCOUNTER — Ambulatory Visit: Payer: BC Managed Care – PPO | Admitting: Family Medicine

## 2022-04-28 ENCOUNTER — Ambulatory Visit: Payer: BC Managed Care – PPO | Admitting: Internal Medicine

## 2022-04-28 ENCOUNTER — Encounter: Payer: Self-pay | Admitting: Internal Medicine

## 2022-04-28 VITALS — BP 124/82 | HR 97 | Ht 65.0 in | Wt 202.8 lb

## 2022-04-28 DIAGNOSIS — B9789 Other viral agents as the cause of diseases classified elsewhere: Secondary | ICD-10-CM | POA: Diagnosis not present

## 2022-04-28 DIAGNOSIS — J019 Acute sinusitis, unspecified: Secondary | ICD-10-CM

## 2022-04-28 NOTE — Patient Instructions (Signed)
It was a pleasure to see you today.  Thank you for giving [place] the opportunity to be involved in your care.  Below is a brief recap of your visit and next steps.  We will plan to see you again in 05/05/22 with Dr. Moshe Cipro.  Summary You likely have viral sinus infection. I recommend you continue your current treatment measures as we discussed, and consider adding Sudafed.  Next steps You have follow up scheduled with Dr. Moshe Cipro on 10/3

## 2022-04-28 NOTE — Progress Notes (Signed)
Acute Office Visit  Subjective:     Patient ID: Christina [name], female    DOB: [birth year], 46 y.o.   MRN: [id number]  Chief Complaint  Patient presents with   Ear Pain    Ear pain in both ears since 04/25/2022.   Ms. Flury presents today for an acute visit.  She reports bilateral ear pain, throat irritation, and bilateral maxillary pain for the last 4 days.  She additionally endorses a headache originating from the occipital region of her head and radiating to the front.  She feels a pain behind her eyes as well.  She endorses chills and has checked her temperature with a reported maximum temperature of 99.5 F.  She endorses body aches as well.  Ms. Zendejas states that her youngest child has been sick for the past week.  She has been taking Tylenol and using fluticasone for symptom relief with minimal improvement.  She would like to discuss additional treatment options and to review if an antibiotic is indicated.  Review of Systems  HENT:  Positive for congestion, ear pain, sinus pain and sore throat.   Neurological:  Positive for headaches.  All other systems reviewed and are negative.     Objective:    BP 124/82   Pulse 97   Ht '5\' 5"'$  (1.651 m)   Wt 202 lb 12.8 oz (92 kg)   SpO2 99%   BMI 33.75 kg/m   Physical Exam Constitutional:      General: She is not in acute distress.    Appearance: Normal appearance. She is not toxic-appearing.  HENT:     Head: Normocephalic and atraumatic.     Comments: There is tenderness palpation over the maxillary sinuses bilaterally.    Right Ear: Tympanic membrane and external ear normal. There is no impacted cerumen.     Left Ear: Tympanic membrane, ear canal and external ear normal. There is no impacted cerumen.     Ears:     Comments: Mild erythema present in the right ear canal    Nose: Congestion present. No rhinorrhea.     Mouth/Throat:     Mouth: Mucous membranes are moist.     Pharynx: Oropharynx is clear. No oropharyngeal exudate or  posterior oropharyngeal erythema.  Eyes:     General: No scleral icterus.    Extraocular Movements: Extraocular movements intact.     Conjunctiva/sclera: Conjunctivae normal.     Pupils: Pupils are equal, round, and reactive to light.     Comments: Stye present medial aspect of left lower eyelid  Cardiovascular:     Rate and Rhythm: Normal rate and regular rhythm.     Pulses: Normal pulses.     Heart sounds: Normal heart sounds. No murmur heard.    No friction rub. No gallop.  Pulmonary:     Effort: Pulmonary effort is normal.     Breath sounds: Normal breath sounds. No wheezing, rhonchi or rales.  Abdominal:     General: Abdomen is flat. Bowel sounds are normal. There is no distension.     Palpations: Abdomen is soft.     Tenderness: There is no abdominal tenderness.  Musculoskeletal:     Cervical back: Normal range of motion.  Lymphadenopathy:     Cervical: No cervical adenopathy.  Neurological:     Mental Status: She is alert.       Assessment & Plan:   Problem List Items Addressed This Visit       Acute viral sinusitis -  Primary    Seen today for an acute visit for the symptoms mentioned above.  Her symptoms seem most consistent with acute viral sinusitis.  We discussed treatment recommendations.  She has been using fluticasone and Tylenol.  Both of these measures and fine.  I recommended that she add a saline rinse and a nasal decongestant to her regimen.  No indication for antibiotic treatment for now in the absence of fever and unilateral sinus pain. -She currently has follow-up scheduled for 10/3 with Dr. Moshe Cipro for reassessment      Return if symptoms worsen or fail to improve.  (Follow-up previously scheduled for 10/3 with Dr. Moshe Cipro)  Johnette Abraham, MD

## 2022-04-28 NOTE — Assessment & Plan Note (Signed)
Seen today for an acute visit for the symptoms mentioned above.  Her symptoms seem most consistent with acute viral sinusitis.  We discussed treatment recommendations.  She has been using fluticasone and Tylenol.  Both of these measures and fine.  I recommended that she add a saline rinse and a nasal decongestant to her regimen.  No indication for antibiotic treatment for now in the absence of fever and unilateral sinus pain. -She currently has follow-up scheduled for 10/3 with Dr. Moshe Cipro for reassessment

## 2022-04-29 ENCOUNTER — Telehealth (HOSPITAL_COMMUNITY): Payer: Self-pay

## 2022-04-29 ENCOUNTER — Ambulatory Visit: Payer: BC Managed Care – PPO | Admitting: Psychology

## 2022-04-29 NOTE — Telephone Encounter (Signed)
We do not give 22-monthsupply medication.  Please ask if she has enough until her next appointment otherwise give the medicine until her next appointment.

## 2022-04-29 NOTE — Telephone Encounter (Signed)
Spoke to patient she stated that she does have enough until her next appt she also stated that [facility] automatically sends those request out she did not request that.

## 2022-04-29 NOTE — Telephone Encounter (Signed)
Fax from [facility] requesting a 6 month supply of Aripiprazole '5mg'$  tablets last appt 02/16/22 next appt 05/20/22   ARIPiprazole (ABILIFY) 5 MG tablet 90 tablet 0 02/16/2022    Sig - Route: Take 1 tablet (5 mg total) by mouth every evening. - Oral   Sent to pharmacy as: ARIPiprazole (ABILIFY) 5 MG tablet   E-Prescribing Status: Receipt confirmed by pharmacy (02/16/2022  2:15 PM EDT)

## 2022-05-05 ENCOUNTER — Ambulatory Visit: Payer: BC Managed Care – PPO | Admitting: Family Medicine

## 2022-05-05 ENCOUNTER — Encounter: Payer: Self-pay | Admitting: Family Medicine

## 2022-05-05 VITALS — BP 118/80 | HR 89 | Ht 65.0 in | Wt 204.1 lb

## 2022-05-05 DIAGNOSIS — J0140 Acute pansinusitis, unspecified: Secondary | ICD-10-CM

## 2022-05-05 DIAGNOSIS — I10 Essential (primary) hypertension: Secondary | ICD-10-CM

## 2022-05-05 DIAGNOSIS — J3089 Other allergic rhinitis: Secondary | ICD-10-CM | POA: Diagnosis not present

## 2022-05-05 DIAGNOSIS — Z13220 Encounter for screening for lipoid disorders: Secondary | ICD-10-CM

## 2022-05-05 DIAGNOSIS — T781XXD Other adverse food reactions, not elsewhere classified, subsequent encounter: Secondary | ICD-10-CM | POA: Diagnosis not present

## 2022-05-05 DIAGNOSIS — Z794 Long term (current) use of insulin: Secondary | ICD-10-CM

## 2022-05-05 DIAGNOSIS — J301 Allergic rhinitis due to pollen: Secondary | ICD-10-CM | POA: Diagnosis not present

## 2022-05-05 DIAGNOSIS — E1165 Type 2 diabetes mellitus with hyperglycemia: Secondary | ICD-10-CM

## 2022-05-05 MED ORDER — AZITHROMYCIN 250 MG PO TABS: ORAL_TABLET | ORAL | 0 refills | Status: AC

## 2022-05-05 MED ORDER — FLUCONAZOLE 150 MG PO TABS: 150.0000 mg | ORAL_TABLET | Freq: Once | ORAL | 0 refills | Status: AC

## 2022-05-05 NOTE — Patient Instructions (Addendum)
F/u IN 4.5  MONTHS, CALL IF YOU NEED ME SOONER  Zpack and fluconazole are prescribed for sinus infection and cough  Nurse visit for flu vaccine in 2 weeks   Fasting lipid, cmp and eGFR in 2 weeks   It is important that you exercise regularly at least 30 minutes 5 times a week. If you develop chest pain, have severe difficulty breathing, or feel very tired, stop exercising immediately and seek medical attention   Think about what you will eat, plan ahead. Choose " clean, green, fresh or frozen" over canned, processed or packaged foods which are more sugary, salty and fatty. 70 to 75% of food eaten should be vegetables and fruit. Three meals at set times with snacks allowed between meals, but they must be fruit or vegetables. Aim to eat over a 12 hour period , example 7 am to 7 pm, and STOP after  your last meal of the day. Drink water,generally about 64 ounces per day, no other drink is as healthy. Fruit juice is best enjoyed in a healthy way, by EATING the fruit.  Excellent foot exam  Thanks for choosing [facility], we consider it a privelige to serve you.  

## 2022-05-07 ENCOUNTER — Ambulatory Visit (INDEPENDENT_AMBULATORY_CARE_PROVIDER_SITE_OTHER): Payer: BC Managed Care – PPO | Admitting: Psychology

## 2022-05-07 DIAGNOSIS — F319 Bipolar disorder, unspecified: Secondary | ICD-10-CM

## 2022-05-07 NOTE — Progress Notes (Signed)
[facility] Counselor/Therapist Progress Note  Patient ID: Christina [name], MRN: [id number],    Date: 05/07/2022  Time Spent: 10:01am-10:47am  Treatment Type: Individual Therapy  Pt is seen for a virtual video visit via caregility.  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  Reported Symptoms:  Pt reports feeling scattered and lack of focus.   Mental Status Exam: Appearance:  NA     Behavior: Appropriate  Motor: Normal  Speech/Language:  Normal Rate  Affect: Appropriate  Mood: anxious  Thought process: normal  Thought content:   WNL  Sensory/Perceptual disturbances:   WNL  Orientation: oriented to person, place, time/date, and situation  Attention: Good  Concentration: Good  Memory: WNL  Fund of knowledge:  Good  Insight:   Good  Judgment:  Good  Impulse Control: Good   Risk Assessment: Danger to Self:  No Self-injurious Behavior: No Danger to Others: No Duty to Warn:no Physical Aggression / Violence:No   Subjective: Counselor assessed pt current functioning per pt report.  Processed w/pt feelings and contributing factors.  Validated and normalized feelings w/ uncertainty w/ job and w/ family stressors.  Discussed setting intentions for daily tasks and use of time for grounding w/ walks, brain breaks etc.  Pt affect wnl.  Pt reported struggles w/ feeling focused and that feels being pulled in so many directions and not getting anything completed.  Pt recognized feeling scattered and intentional about concrete step of making a list to refer to.  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  Pt shared about steps she is taking and ways she is reframing to focus for self.  Pt agreed need for time for self and grounding and that can't be everything to everyone.      Interventions: Cognitive Behavioral Therapy and  ACT and supportive  Diagnosis:Bipolar I disorder ([place])  Plan: Pt to f/u w/ cousneling in 1 week to assist coping w/ grief and mood stability.   Pt to f/u w/ [name] as scheduled.  Treatment Plan 01/27/22   Client Abilities/Strengths  supports: family- partner and mom, friends  positives: self awareness, expressing feelings, advocating for self. Motivated, hardworking, intelligent, family centered   Client Treatment Preferences  Weekly to biweekly counseling and continue medication management [name].    Client Statement of Needs  pt "to preserve my mental health.  Not be afraid to dream big.  Be able to continue to improve. Maintain compliance with my therapy, medications, holistic approaches.  I don't want to go back to where I was in the past"   Treatment Level  outpatient counseling    Symptoms  death of father 01-Apr-2021.  Husband dx w/ heart failure 11/2021. Grief and anxiety. History of at least one hypomanic, manic, or mixed mood episode.Marland Kitchen History of chronic or recurrent depression for which the client has taken antidepressant medication and had outpatient treatment,     Goals 1. Begin a healthy grieving process around the loss.   Objective Begin verbalizing feelings associated with the loss. Target Date: 2023-01-28 Frequency: Daily  Progress: 50 Modality: individual  Related Interventions Assist the client in identifying and expressing feelings connected with his/her loss.   2. continue to use calming skills to assist coping w/ stress and managing mood Objective Maintain positive self care, use of mindfulness and other grounding  activities to maintain mood stability. Target Date: 2023-01-28 Frequency: Daily  Progress: 70 Modality: individual  Related Interventions Assist the client in establishing a routine pattern of daily activities such as sleeping, eating, solitary and social activities, and exercise; use of relaxation and grounding strategies routine and assist in  assessing mood.   3. Develop healthy cognitive patterns and beliefs about self and the world that lead to alleviation and help prevent the relapse of mood episodes.   Objective Identify and replace thoughts and behaviors that trigger manic or depressive symptoms. Target Date: 2023-01-28 Frequency: Daily  Progress: 60 Modality: individual  Related Interventions utilize CBT strategies to assist in challenging negative thought patterns and beliefs and ACT strategies to assist in actions, decisions and interactions consistent w/values.   Client participated in Treatment plan development and provided verbal consent     [name], [facility]

## 2022-05-11 ENCOUNTER — Encounter: Payer: Self-pay | Admitting: Family Medicine

## 2022-05-11 DIAGNOSIS — J019 Acute sinusitis, unspecified: Secondary | ICD-10-CM | POA: Insufficient documentation

## 2022-05-11 NOTE — Assessment & Plan Note (Addendum)
z pack prescribed and fluconazole x 1 , if needed

## 2022-05-11 NOTE — Assessment & Plan Note (Signed)
  Patient re-educated about  the importance of commitment to a  minimum of 150 minutes of exercise per week as able.  The importance of healthy food choices with portion control discussed, as well as eating regularly and within a 12 hour window most days. The need to choose "clean , green" food 50 to 75% of the time is discussed, as well as to make water the primary drink and set a goal of 64 ounces water daily.       05/05/2022    2:30 PM 04/28/2022    9:19 AM 03/18/2022   11:32 AM  Weight /BMI  Weight 204 lb 1.9 oz 202 lb 12.8 oz 204 lb 9.4 oz  Height '5\' 5"'$  (1.651 m) '5\' 5"'$  (1.651 m) '5\' 5"'$  (1.651 m)  BMI 33.97 kg/m2 33.75 kg/m2 34.05 kg/m2

## 2022-05-11 NOTE — Assessment & Plan Note (Signed)
Controlled, no change in medication DASH diet and commitment to daily physical activity for a minimum of 30 minutes discussed and encouraged, as a part of hypertension management. The importance of attaining a healthy weight is also discussed.     05/05/2022    3:32 PM 05/05/2022    2:30 PM 04/28/2022    9:19 AM 03/18/2022    2:38 PM 03/18/2022    2:15 PM 03/18/2022    2:00 PM 03/18/2022    1:45 PM  BP/Weight  Systolic BP [phone] 417 [phone]  Diastolic BP 80 87 82 92 94 91 92  Wt. (Lbs)  204.12 202.8      BMI  33.97 kg/m2 33.75 kg/m2

## 2022-05-11 NOTE — Progress Notes (Signed)
Christina [name]     MRN: [id number]      DOB: 16-Apr-1976   HPI Christina [name] is here for follow up and re-evaluation of chronic medical conditions, medication management and review of any available recent lab and radiology data.  Preventive health is updated, specifically  Cancer screening and Immunization.   Questions or concerns regarding consultations or procedures which the PT has had in the interim are  addressed. The PT denies any adverse reactions to current medications since the last visit.  1 week  h/o sinus pressure , pain and cough, yellow green nasal drainage and sputum, not improving. Chills no documented fever  ROS Denies chest pains, palpitations and leg swelling Denies abdominal pain, nausea, vomiting,diarrhea or constipation.   Denies dysuria, frequency, hesitancy or incontinence. Denies joint pain, swelling and limitation in mobility. Denies headaches, seizures, numbness, or tingling. Denies depression, anxiety or insomnia. Denies skin break down or rash.   PE  BP 118/80   Pulse 89   Ht '5\' 5"'$  (1.651 m)   Wt 204 lb 1.9 oz (92.6 kg)   SpO2 96%   BMI 33.97 kg/m   Patient alert and oriented and in no cardiopulmonary distress.  HEENT: No facial asymmetry, EOMI,     Neck supple Maxillary and frontal sinus tenderness, TM clear.  Chest: Clear to auscultation bilaterally.  CVS: S1, S2 no murmurs, no S3.Regular rate.  ABD: Soft non tender.   Ext: No edema  MS: Adequate ROM spine, shoulders, hips and knees.  Skin: Intact, no ulcerations or rash noted.  Psych: Good eye contact, normal affect. Memory intact not anxious or depressed appearing.  CNS: CN 2-12 intact, power,  normal throughout.no focal deficits noted.   Assessment & Plan  Acute sinusitis z pack prescribed and fluconazole x 1 , if needed  Essential hypertension, benign Controlled, no change in medication DASH diet and commitment to daily physical activity for a minimum of 30 minutes discussed and  encouraged, as a part of hypertension management. The importance of attaining a healthy weight is also discussed.     05/05/2022    3:32 PM 05/05/2022    2:30 PM 04/28/2022    9:19 AM 03/18/2022    2:38 PM 03/18/2022    2:15 PM 03/18/2022    2:00 PM 03/18/2022    1:45 PM  BP/Weight  Systolic BP 774 128 786 767 [phone]  Diastolic BP 80 87 82 92 94 91 92  Wt. (Lbs)  204.12 202.8      BMI  33.97 kg/m2 33.75 kg/m2           Morbid obesity (HCC)  Patient re-educated about  the importance of commitment to a  minimum of 150 minutes of exercise per week as able.  The importance of healthy food choices with portion control discussed, as well as eating regularly and within a 12 hour window most days. The need to choose "clean , green" food 50 to 75% of the time is discussed, as well as to make water the primary drink and set a goal of 64 ounces water daily.       05/05/2022    2:30 PM 04/28/2022    9:19 AM 03/18/2022   11:32 AM  Weight /BMI  Weight 204 lb 1.9 oz 202 lb 12.8 oz 204 lb 9.4 oz  Height '5\' 5"'$  (1.651 m) '5\' 5"'$  (1.651 m) '5\' 5"'$  (1.651 m)  BMI 33.97 kg/m2 33.75 kg/m2 34.05 kg/m2  Diabetes mellitus ([place]) Christina [name] is reminded of the importance of commitment to daily physical activity for 30 minutes or more, as able and the need to limit carbohydrate intake to 30 to 60 grams per meal to help with blood sugar control.   The need to take medication as prescribed, test blood sugar as directed, and to call between visits if there is a concern that blood sugar is uncontrolled is also discussed.   Christina [name] is reminded of the importance of daily foot exam, annual eye examination, and good blood sugar, blood pressure and cholesterol control.     Latest Ref Rng & Units 03/13/2022    1:50 PM 12/23/2021   10:10 AM 08/19/2021    8:31 AM 03/26/2021   10:30 AM 12/11/2020    8:37 AM  Diabetic Labs  HbA1c 4.8 - 5.6 %  7.1  6.9  7.8    Micro/Creat Ratio 0 - 29 mg/g creat   25     Chol  100 - 199 mg/dL   106   112   HDL >39 mg/dL   57   71   Calc LDL 0 - 99 mg/dL   32   25   Triglycerides 0 - 149 mg/dL   88   78   Creatinine 0.44 - 1.00 mg/dL 0.73  0.78  0.78   0.82       05/05/2022    3:32 PM 05/05/2022    2:30 PM 04/28/2022    9:19 AM 03/18/2022    2:38 PM 03/18/2022    2:15 PM 03/18/2022    2:00 PM 03/18/2022    1:45 PM  BP/Weight  Systolic BP 093 818 299 371 [phone]  Diastolic BP 80 87 82 92 94 91 92  Wt. (Lbs)  204.12 202.8      BMI  33.97 kg/m2 33.75 kg/m2          Latest Ref Rng & Units 05/05/2022    2:20 PM 02/12/2022   12:00 AM  Foot/eye exam completion dates  Eye Exam No Retinopathy  No Retinopathy      Foot Form Completion  Done      This result is from an external source.      Controlled, no change in medication Managed by Endo at [facility]

## 2022-05-11 NOTE — Assessment & Plan Note (Signed)
Christina [name] is reminded of the importance of commitment to daily physical activity for 30 minutes or more, as able and the need to limit carbohydrate intake to 30 to 60 grams per meal to help with blood sugar control.   The need to take medication as prescribed, test blood sugar as directed, and to call between visits if there is a concern that blood sugar is uncontrolled is also discussed.   Christina [name] is reminded of the importance of daily foot exam, annual eye examination, and good blood sugar, blood pressure and cholesterol control.     Latest Ref Rng & Units 03/13/2022    1:50 PM 12/23/2021   10:10 AM 08/19/2021    8:31 AM 03/26/2021   10:30 AM 12/11/2020    8:37 AM  Diabetic Labs  HbA1c 4.8 - 5.6 %  7.1  6.9  7.8    Micro/Creat Ratio 0 - 29 mg/g creat   25     Chol 100 - 199 mg/dL   106   112   HDL >39 mg/dL   57   71   Calc LDL 0 - 99 mg/dL   32   25   Triglycerides 0 - 149 mg/dL   88   78   Creatinine 0.44 - 1.00 mg/dL 0.73  0.78  0.78   0.82       05/05/2022    3:32 PM 05/05/2022    2:30 PM 04/28/2022    9:19 AM 03/18/2022    2:38 PM 03/18/2022    2:15 PM 03/18/2022    2:00 PM 03/18/2022    1:45 PM  BP/Weight  Systolic BP 115 726 203 559 [phone]  Diastolic BP 80 87 82 92 94 91 92  Wt. (Lbs)  204.12 202.8      BMI  33.97 kg/m2 33.75 kg/m2          Latest Ref Rng & Units 05/05/2022    2:20 PM 02/12/2022   12:00 AM  Foot/eye exam completion dates  Eye Exam No Retinopathy  No Retinopathy      Foot Form Completion  Done      This result is from an external source.      Controlled, no change in medication Managed by Endo at [facility]

## 2022-05-13 ENCOUNTER — Ambulatory Visit (INDEPENDENT_AMBULATORY_CARE_PROVIDER_SITE_OTHER): Payer: BC Managed Care – PPO | Admitting: Psychology

## 2022-05-13 DIAGNOSIS — F319 Bipolar disorder, unspecified: Secondary | ICD-10-CM | POA: Diagnosis not present

## 2022-05-13 NOTE — Progress Notes (Signed)
[place] Counselor/Therapist Progress Note  Patient ID: Christina [name], MRN: [id number],    Date: 05/13/2022  Time Spent: 10:12am-10:48am  Treatment Type: Individual Therapy  Pt is seen for a virtual audio visit via caregility, then phone.  Pt had connection issues and was unable to join w/ video today.  pt joins from her car, reporting privacy, and counselor from her home office.  Reported Symptoms:  Pt some moments of feeling overwhelmed, advocating for self and increased clarity.   Mental Status Exam: Appearance:  NA     Behavior: Appropriate  Motor: Normal  Speech/Language:  Normal Rate  Affect: Appropriate  Mood: normal  Thought process: normal  Thought content:   WNL  Sensory/Perceptual disturbances:   WNL  Orientation: oriented to person, place, time/date, and situation  Attention: Good  Concentration: Good  Memory: WNL  Fund of knowledge:  Good  Insight:   Good  Judgment:  Good  Impulse Control: Good   Risk Assessment: Danger to Self:  No Self-injurious Behavior: No Danger to Others: No Duty to Warn:no Physical Aggression / Violence:No   Subjective: Counselor assessed pt current functioning per pt report.  Processed w/pt emotions, stressors and decisions.  Assisted pt w/ recognizing positives and how she has coped through overwhelming moments.  Reflected positive of advocating for self and positive response.  Assisted pt in recognizing her values at this time and aligning her decisions to put energy towards.  Pt affect wnl.  Pt reported a couple of moments escalating w/ feeling overwhelmed.  Pt was able to name, acknowledge and focus on something she could do in that moment.  Pt was able to reflect on how she has grown w/ this through counseling.  Pt reports she talked w/ her supervisor to check in w/ where her job was headed and her wants.  Pt discussed couple moments of interactions w/ friends that reinforced to her that she doesn't have to be  "superwoman" and that she is recognizing what is important to her now.   Interventions: Cognitive Behavioral Therapy and ACT and supportive  Diagnosis:Bipolar I disorder ([place])  Plan: Pt to f/u w/ cousneling in 1 week to assist coping w/ grief and mood stability.   Pt to f/u w/ [name] as scheduled.  Treatment Plan 01/27/22   Client Abilities/Strengths  supports: family- partner and mom, friends  positives: self awareness, expressing feelings, advocating for self. Motivated, hardworking, intelligent, family centered   Client Treatment Preferences  Weekly to biweekly counseling and continue medication management [name].    Client Statement of Needs  pt "to preserve my mental health.  Not be afraid to dream big.  Be able to continue to improve. Maintain compliance with my therapy, medications, holistic approaches.  I don't want to go back to where I was in the past"   Treatment Level  outpatient counseling    Symptoms  death of father 2021/04/11.  Husband dx w/ heart failure 11/2021. Grief and anxiety. History of at least one hypomanic, manic, or mixed mood episode.Marland Kitchen History of chronic or recurrent depression for which the client has taken antidepressant medication and had outpatient treatment,     Goals 1. Begin a healthy grieving process around the loss.   Objective Begin verbalizing feelings associated with the loss. Target Date: 2023-01-28 Frequency: Daily  Progress: 50 Modality: individual  Related Interventions Assist the client in identifying and expressing feelings connected with his/her loss.   2. continue to use calming skills  to assist coping w/ stress and managing mood Objective Maintain positive self care, use of mindfulness and other grounding activities to maintain mood stability. Target Date: 2023-01-28 Frequency: Daily  Progress: 70 Modality: individual  Related Interventions Assist the client in establishing a routine pattern of daily activities such as  sleeping, eating, solitary and social activities, and exercise; use of relaxation and grounding strategies routine and assist in assessing mood.   3. Develop healthy cognitive patterns and beliefs about self and the world that lead to alleviation and help prevent the relapse of mood episodes.   Objective Identify and replace thoughts and behaviors that trigger manic or depressive symptoms. Target Date: 2023-01-28 Frequency: Daily  Progress: 60 Modality: individual  Related Interventions utilize CBT strategies to assist in challenging negative thought patterns and beliefs and ACT strategies to assist in actions, decisions and interactions consistent w/values.   Client participated in Treatment plan development and provided verbal consent       [name], [place]

## 2022-05-20 ENCOUNTER — Encounter (HOSPITAL_COMMUNITY): Payer: Self-pay | Admitting: Psychiatry

## 2022-05-20 ENCOUNTER — Telehealth (HOSPITAL_BASED_OUTPATIENT_CLINIC_OR_DEPARTMENT_OTHER): Payer: BC Managed Care – PPO | Admitting: Psychiatry

## 2022-05-20 ENCOUNTER — Ambulatory Visit (INDEPENDENT_AMBULATORY_CARE_PROVIDER_SITE_OTHER): Payer: BC Managed Care – PPO

## 2022-05-20 ENCOUNTER — Ambulatory Visit (INDEPENDENT_AMBULATORY_CARE_PROVIDER_SITE_OTHER): Payer: BC Managed Care – PPO | Admitting: Psychology

## 2022-05-20 DIAGNOSIS — Z23 Encounter for immunization: Secondary | ICD-10-CM | POA: Diagnosis not present

## 2022-05-20 DIAGNOSIS — F319 Bipolar disorder, unspecified: Secondary | ICD-10-CM | POA: Diagnosis not present

## 2022-05-20 DIAGNOSIS — F419 Anxiety disorder, unspecified: Secondary | ICD-10-CM

## 2022-05-20 DIAGNOSIS — F3162 Bipolar disorder, current episode mixed, moderate: Secondary | ICD-10-CM

## 2022-05-20 DIAGNOSIS — I10 Essential (primary) hypertension: Secondary | ICD-10-CM | POA: Diagnosis not present

## 2022-05-20 DIAGNOSIS — Z13220 Encounter for screening for lipoid disorders: Secondary | ICD-10-CM | POA: Diagnosis not present

## 2022-05-20 MED ORDER — ARIPIPRAZOLE 5 MG PO TABS: 5.0000 mg | ORAL_TABLET | Freq: Every evening | ORAL | 0 refills | Status: DC

## 2022-05-20 NOTE — Progress Notes (Signed)
Virtual Visit via Video Note  I connected with Christina [name] on 05/20/22 at  8:40 AM EDT by a video enabled telemedicine application and verified that I am speaking with the correct person using two identifiers.  Location: Patient: In Car Provider: Home Office   I discussed the limitations of evaluation and management by telemedicine and the availability of in person appointments. The patient expressed understanding and agreed to proceed.  History of Present Illness: Patient is evaluated by video session.  She is in the car.  She reported her mood is a stable but she is anxious about the finances.  Her husband condition remains unchanged but she is pleased at least he is stable.  She is now looking for a better paying job because husband cannot work.  She is sleeping good.  She is disappointed because the grand did not approved and 10 days later they fired the commissioner.  She is now more responsibilities at work.  Even though she like her job but needs a better paying job.  She has no tremors, shakes or any EPS.  She denies any mania, psychosis, hallucination or any suicidal thoughts.  She is busy taking care of the kids and sometimes she has to take them to the sports events.  They have 36, 39 and 46 years old.  She like to keep the current medication.  Recently she had a visit with her PCP.  She reported her hemoglobin A1c 6.9.  Patient does not want to change the medication.   Past Psychiatric History:  H/O depression, anxiety, mania and psychosis.  Ad[facility] in [facility] at [facility] due to suicidal gestures.  Seen in this office since 2006. Tried Prozac and Wellbutrin.    Psychiatric Specialty Exam: Physical Exam  Review of Systems  Weight 204 lb (92.5 kg), unknown if currently breastfeeding.Body mass index is 33.95 kg/m.  General Appearance: Casual  Eye Contact:  Good  Speech:  Clear and Coherent  Volume:  Normal  Mood:  Anxious  Affect:  Appropriate  Thought Process:  Goal Directed   Orientation:  Full (Time, Place, and Person)  Thought Content:  Logical  Suicidal Thoughts:  No  Homicidal Thoughts:  No  Memory:  Immediate;   Good  Judgement:  Good  Insight:  Good  Psychomotor Activity:  Normal  Concentration:  Concentration: Good and Attention Span: Good  Recall:  Good  Fund of Knowledge:  Good  Language:  Good  Akathisia:  No  Handed:  Right  AIMS (if indicated):     Assets:  Communication Skills Desire for Improvement Housing Talents/Skills Transportation  ADL's:  Intact  Cognition:  WNL  Sleep:   ok      Assessment and Plan: Bipolar disorder type I.  Anxiety.  Patient like to keep the current medicine because it is helping her mania, depression and anxiety.  Her hemoglobin A1c remains unchanged from the past.  She is hoping to have a better paying job since husband is not working due to health conditions.  Discussed medication side effects and benefits.  Continue Abilify 5 mg daily.  Recommended to call us back if she has any question or any concern.  Follow-up in 3 months.  Follow Up Instructions:    I discussed the assessment and treatment plan with the patient. The patient was provided an opportunity to ask questions and all were answered. The patient agreed with the plan and demonstrated an understanding of the instructions.   The patient was advised to  call back or seek an in-person evaluation if the symptoms worsen or if the condition fails to improve as anticipated.  Collaboration of Care: Primary Care Provider AEB notes are available in epic to review.  Patient/Guardian was advised Release of Information must be obtained prior to any record release in order to collaborate their care with an outside provider. Patient/Guardian was advised if they have not already done so to contact the registration department to sign all necessary forms in order for [place] to release information regarding their care.   Consent: Patient/Guardian gives verbal  consent for treatment and assignment of benefits for services provided during this visit. Patient/Guardian expressed understanding and agreed to proceed.    I provided 18 minutes of non-face-to-face time during this encounter.   Kathlee Nations, MD

## 2022-05-20 NOTE — Progress Notes (Signed)
[place] Counselor/Therapist Progress Note  Patient ID: Christina [name], MRN: [id number],    Date: 05/20/2022  Time Spent: 1:30pm-2:11pm  Treatment Type: Individual Therapy  Pt is seen for a virtual video visit via caregility.    pt joins from her car outside of work, Field seismologist, and counselor from her home office.  Reported Symptoms:  Pt acknowledging struggles- some days overwhelmed- and focus on coping day to day.      Mental Status Exam: Appearance:  NA     Behavior: Appropriate  Motor: Normal  Speech/Language:  Normal Rate  Affect: Appropriate  Mood: normal  Thought process: normal  Thought content:   WNL  Sensory/Perceptual disturbances:   WNL  Orientation: oriented to person, place, time/date, and situation  Attention: Good  Concentration: Good  Memory: WNL  Fund of knowledge:  Good  Insight:   Good  Judgment:  Good  Impulse Control: Good   Risk Assessment: Danger to Self:  No Self-injurious Behavior: No Danger to Others: No Duty to Warn:no Physical Aggression / Violence:No   Subjective: Counselor assessed pt current functioning per pt report.  Processed w/pt stressors and emotions.  Assisted pt in validating and normalizing her emotions and giving self permission to not be 'ok'.  Explored coping skills and focus on taking each day w/ what pt knows at that time.  Discussed how getting some resolutions to questions.  Pt affect wnl.  Pt reported acknowledging that she has been going through a lot of tough times over past 2 years.  Pt discussed that ok for her to not minimize w/ "I'm ok".  Pt discussed that has been hard to not know what is next for her in the past couple months.  Pt is able to acknowledge that she has made decisions, sought info and advocated for self.  Pt reports that financially she is unable to take step back from current role as pay cut more than anticipated.  Pt dicussed acceptance of decisions from what she knows now.     Interventions: Cognitive Behavioral Therapy and ACT and supportive  Diagnosis:Bipolar I disorder ([place])  Plan: Pt to f/u w/ cousneling in 1 week to assist coping w/ grief and mood stability.   Pt to f/u w/ [name] as scheduled.  Treatment Plan 01/27/22   Client Abilities/Strengths  supports: family- partner and mom, friends  positives: self awareness, expressing feelings, advocating for self. Motivated, hardworking, intelligent, family centered   Client Treatment Preferences  Weekly to biweekly counseling and continue medication management [name].    Client Statement of Needs  pt "to preserve my mental health.  Not be afraid to dream big.  Be able to continue to improve. Maintain compliance with my therapy, medications, holistic approaches.  I don't want to go back to where I was in the past"   Treatment Level  outpatient counseling    Symptoms  death of father 04-02-2021.  Husband dx w/ heart failure 11/2021. Grief and anxiety. History of at least one hypomanic, manic, or mixed mood episode.Marland Kitchen History of chronic or recurrent depression for which the client has taken antidepressant medication and had outpatient treatment,     Goals 1. Begin a healthy grieving process around the loss.   Objective Begin verbalizing feelings associated with the loss. Target Date: 2023-01-28 Frequency: Daily  Progress: 50 Modality: individual  Related Interventions Assist the client in identifying and expressing feelings connected with his/her loss.   2. continue to use calming  skills to assist coping w/ stress and managing mood Objective Maintain positive self care, use of mindfulness and other grounding activities to maintain mood stability. Target Date: 2023-01-28 Frequency: Daily  Progress: 70 Modality: individual  Related Interventions Assist the client in establishing a routine pattern of daily activities such as sleeping, eating, solitary and social activities, and exercise; use of  relaxation and grounding strategies routine and assist in assessing mood.   3. Develop healthy cognitive patterns and beliefs about self and the world that lead to alleviation and help prevent the relapse of mood episodes.   Objective Identify and replace thoughts and behaviors that trigger manic or depressive symptoms. Target Date: 2023-01-28 Frequency: Daily  Progress: 60 Modality: individual  Related Interventions utilize CBT strategies to assist in challenging negative thought patterns and beliefs and ACT strategies to assist in actions, decisions and interactions consistent w/values.   Client participated in Treatment plan development and provided verbal consent      [name], [facility]

## 2022-05-21 LAB — CMP14+EGFR
ALT: 11 IU/L (ref 0–32)
AST: 13 IU/L (ref 0–40)
Albumin/Globulin Ratio: 2 (ref 1.2–2.2)
Albumin: 4.8 g/dL (ref 3.9–4.9)
Alkaline Phosphatase: 60 IU/L (ref 44–121)
BUN/Creatinine Ratio: 7 — ABNORMAL LOW (ref 9–23)
BUN: 6 mg/dL (ref 6–24)
Bilirubin Total: 0.4 mg/dL (ref 0.0–1.2)
CO2: 23 mmol/L (ref 20–29)
Calcium: 9.6 mg/dL (ref 8.7–10.2)
Chloride: 102 mmol/L (ref 96–106)
Creatinine, Ser: 0.83 mg/dL (ref 0.57–1.00)
Globulin, Total: 2.4 g/dL (ref 1.5–4.5)
Glucose: 109 mg/dL — ABNORMAL HIGH (ref 70–99)
Potassium: 4.8 mmol/L (ref 3.5–5.2)
Sodium: 142 mmol/L (ref 134–144)
Total Protein: 7.2 g/dL (ref 6.0–8.5)
eGFR: 88 mL/min/{1.73_m2} (ref >59)

## 2022-05-21 LAB — LIPID PANEL
Chol/HDL Ratio: 1.5 ratio (ref 0.0–4.4)
Cholesterol, Total: 119 mg/dL (ref 100–199)
HDL: 78 mg/dL (ref >39)
LDL Chol Calc (NIH): 26 mg/dL (ref 0–99)
Triglycerides: 71 mg/dL (ref 0–149)
VLDL Cholesterol Cal: 15 mg/dL (ref 5–40)

## 2022-05-27 ENCOUNTER — Ambulatory Visit: Payer: BC Managed Care – PPO | Admitting: Psychology

## 2022-06-02 ENCOUNTER — Other Ambulatory Visit: Payer: Self-pay | Admitting: Nurse Practitioner

## 2022-06-04 ENCOUNTER — Ambulatory Visit: Payer: BC Managed Care – PPO | Admitting: Psychology

## 2022-06-07 ENCOUNTER — Other Ambulatory Visit: Payer: Self-pay | Admitting: Family Medicine

## 2022-06-09 ENCOUNTER — Ambulatory Visit (INDEPENDENT_AMBULATORY_CARE_PROVIDER_SITE_OTHER): Payer: BC Managed Care – PPO | Admitting: Psychology

## 2022-06-09 DIAGNOSIS — F319 Bipolar disorder, unspecified: Secondary | ICD-10-CM | POA: Diagnosis not present

## 2022-06-09 NOTE — Progress Notes (Signed)
[place] Counselor/Therapist Progress Note  Patient ID: Christina [name], MRN: [id number],    Date: 06/09/2022  Time Spent: 11:02am-11:53pm  Treatment Type: Individual Therapy  Pt is seen for a virtual video visit via caregility.    pt joins from her work office, Field seismologist, and counselor from her home office.  Reported Symptoms:  Pt reports some indecision and anxiety about decision making  Mental Status Exam: Appearance:  NA     Behavior: Appropriate  Motor: Normal  Speech/Language:  Normal Rate  Affect: Appropriate  Mood: normal and worry  Thought process: normal  Thought content:   WNL  Sensory/Perceptual disturbances:   WNL  Orientation: oriented to person, place, time/date, and situation  Attention: Good  Concentration: Good  Memory: WNL  Fund of knowledge:  Good  Insight:   Good  Judgment:  Good  Impulse Control: Good   Risk Assessment: Danger to Self:  No Self-injurious Behavior: No Danger to Others: No Duty to Warn:no Physical Aggression / Violence:No   Subjective: Counselor assessed pt current functioning per pt report.  Processed w/pt decisions w/ jobs/career.  Validated and normalized indecision and worry re: decision.  Encouraged pt to gather information and options.  Assisted pt w/ recognizing what is important for self and family.   Pt affect wnl.  Pt reported busy past 2 weeks w/ job, interviews and worries w/ making decision for self and family.  Pt discussed options and risks and benefits of each.  Pt identified one option that off table and recognized still waiting for more clarity w/ others to be able to make decision.  Pt discussed what important to her currently and how plays into making decision.    Interventions: Cognitive Behavioral Therapy and ACT and supportive  Diagnosis:Bipolar I disorder ([place])  Plan: Pt to f/u w/ cousneling in 2 weeks to assist coping w/ grief and mood stability.   Pt to f/u w/ [name] as  scheduled.  Treatment Plan 01/27/22   Client Abilities/Strengths  supports: family- partner and mom, friends  positives: self awareness, expressing feelings, advocating for self. Motivated, hardworking, intelligent, family centered   Client Treatment Preferences  Weekly to biweekly counseling and continue medication management [name].    Client Statement of Needs  pt "to preserve my mental health.  Not be afraid to dream big.  Be able to continue to improve. Maintain compliance with my therapy, medications, holistic approaches.  I don't want to go back to where I was in the past"   Treatment Level  outpatient counseling    Symptoms  death of father 04-11-2021.  Husband dx w/ heart failure 11/2021. Grief and anxiety. History of at least one hypomanic, manic, or mixed mood episode.Marland Kitchen History of chronic or recurrent depression for which the client has taken antidepressant medication and had outpatient treatment,     Goals 1. Begin a healthy grieving process around the loss.   Objective Begin verbalizing feelings associated with the loss. Target Date: 2023-01-28 Frequency: Daily  Progress: 50 Modality: individual  Related Interventions Assist the client in identifying and expressing feelings connected with his/her loss.   2. continue to use calming skills to assist coping w/ stress and managing mood Objective Maintain positive self care, use of mindfulness and other grounding activities to maintain mood stability. Target Date: 2023-01-28 Frequency: Daily  Progress: 70 Modality: individual  Related Interventions Assist the client in establishing a routine pattern of daily activities such as sleeping, eating, solitary  and social activities, and exercise; use of relaxation and grounding strategies routine and assist in assessing mood.   3. Develop healthy cognitive patterns and beliefs about self and the world that lead to alleviation and help prevent the relapse of mood episodes.    Objective Identify and replace thoughts and behaviors that trigger manic or depressive symptoms. Target Date: 2023-01-28 Frequency: Daily  Progress: 60 Modality: individual  Related Interventions utilize CBT strategies to assist in challenging negative thought patterns and beliefs and ACT strategies to assist in actions, decisions and interactions consistent w/values.   Client participated in Treatment plan development and provided verbal consent     [name], [facility]

## 2022-06-23 ENCOUNTER — Ambulatory Visit: Payer: BC Managed Care – PPO | Admitting: Psychology

## 2022-06-30 ENCOUNTER — Ambulatory Visit: Payer: BC Managed Care – PPO | Admitting: Psychology

## 2022-06-30 DIAGNOSIS — H10029 Other mucopurulent conjunctivitis, unspecified eye: Secondary | ICD-10-CM | POA: Diagnosis not present

## 2022-07-07 ENCOUNTER — Ambulatory Visit (INDEPENDENT_AMBULATORY_CARE_PROVIDER_SITE_OTHER): Payer: BC Managed Care – PPO | Admitting: Psychology

## 2022-07-07 DIAGNOSIS — F319 Bipolar disorder, unspecified: Secondary | ICD-10-CM | POA: Diagnosis not present

## 2022-07-07 NOTE — Progress Notes (Signed)
[place] Counselor/Therapist Progress Note  Patient ID: MAYSIE PARKHILL, MRN: [id number],    Date: 07/07/2022  Time Spent: 11:00am-11:47pm  Treatment Type: Individual Therapy  Pt is seen for a virtual video visit via caregility.    pt joins from her car at home, reporting privacy, and counselor from her home office.  Reported Symptoms:  Pt reports some recent depressed mood, hurt w/ recent conflicts and alone.   Mental Status Exam: Appearance:  NA     Behavior: Appropriate  Motor: Normal  Speech/Language:  Normal Rate  Affect: Appropriate  Mood: sad  Thought process: normal  Thought content:   WNL  Sensory/Perceptual disturbances:   WNL  Orientation: oriented to person, place, time/date, and situation  Attention: Good  Concentration: Good  Memory: WNL  Fund of knowledge:  Good  Insight:   Good  Judgment:  Good  Impulse Control: Good   Risk Assessment: Danger to Self:  No Self-injurious Behavior: No Danger to Others: No Duty to Warn:no Physical Aggression / Violence:No   Subjective: Counselor assessed pt current functioning per pt report.  Processed w/pt mood and recent conflicts.  Validated and normalized grief.  Explored interactions w/ mother in law and w/ husband.  Discussed supports and conflict resolution.  Pt affect wnl.  Pt reported feeling depressed prior to thankgiving thinking of her father.  Pt reported conflict w/ mothr in law who hung up on her on phone and hasn't talked w/ her since. Pt reported feeling alone.  Pt also reported conflict w/ husband's godson brought bottle of liquor to him and discovered stash.  Pt discussed how will resolve w/ mother in law by agreeing to disagree.  Pt reports plans for holidays and engaging w/ family.  Pt informed she took job w [employer] and will start at [employer] year and last day w/ [place] is on 07/31/22.    Interventions: Cognitive Behavioral Therapy and ACT and supportive  Diagnosis:Bipolar I  disorder ([place])  Plan: Pt to f/u w/ cousneling in 2 weeks to assist coping w/ grief and mood stability.   Pt to f/u w/ [name] as scheduled.  Treatment Plan 01/27/22   Client Abilities/Strengths  supports: family- partner and mom, friends  positives: self awareness, expressing feelings, advocating for self. Motivated, hardworking, intelligent, family centered   Client Treatment Preferences  Weekly to biweekly counseling and continue medication management [name].    Client Statement of Needs  pt "to preserve my mental health.  Not be afraid to dream big.  Be able to continue to improve. Maintain compliance with my therapy, medications, holistic approaches.  I don't want to go back to where I was in the past"   Treatment Level  outpatient counseling    Symptoms  death of father 03/20/21.  Husband dx w/ heart failure 11/2021. Grief and anxiety. History of at least one hypomanic, manic, or mixed mood episode.Marland Kitchen History of chronic or recurrent depression for which the client has taken antidepressant medication and had outpatient treatment,     Goals 1. Begin a healthy grieving process around the loss.   Objective Begin verbalizing feelings associated with the loss. Target Date: 2023-01-28 Frequency: Daily  Progress: 50 Modality: individual  Related Interventions Assist the client in identifying and expressing feelings connected with his/her loss.   2. continue to use calming skills to assist coping w/ stress and managing mood Objective Maintain positive self care, use of mindfulness and other grounding activities to  maintain mood stability. Target Date: 2023-01-28 Frequency: Daily  Progress: 70 Modality: individual  Related Interventions Assist the client in establishing a routine pattern of daily activities such as sleeping, eating, solitary and social activities, and exercise; use of relaxation and grounding strategies routine and assist in assessing mood.   3. Develop healthy  cognitive patterns and beliefs about self and the world that lead to alleviation and help prevent the relapse of mood episodes.   Objective Identify and replace thoughts and behaviors that trigger manic or depressive symptoms. Target Date: 2023-01-28 Frequency: Daily  Progress: 60 Modality: individual  Related Interventions utilize CBT strategies to assist in challenging negative thought patterns and beliefs and ACT strategies to assist in actions, decisions and interactions consistent w/values.   Client participated in Treatment plan development and provided verbal consent     [name], [facility]

## 2022-07-15 ENCOUNTER — Ambulatory Visit (INDEPENDENT_AMBULATORY_CARE_PROVIDER_SITE_OTHER): Payer: BC Managed Care – PPO | Admitting: Psychology

## 2022-07-15 DIAGNOSIS — F319 Bipolar disorder, unspecified: Secondary | ICD-10-CM

## 2022-07-15 NOTE — Progress Notes (Signed)
[place] Counselor/Therapist Progress Note  Patient ID: Christina [name], MRN: [id number],    Date: 07/15/2022  Time Spent: 1:30pm-2:17pm  Treatment Type: Individual Therapy  Pt is seen for a virtual video visit via caregility.    pt joins from her car at work, Field seismologist, and counselor from her home office.  Reported Symptoms:  Pt reports recent family stress and some worry Mental Status Exam: Appearance:  NA     Behavior: Appropriate  Motor: Normal  Speech/Language:  Normal Rate  Affect: Appropriate  Mood: anxious  Thought process: normal  Thought content:   WNL  Sensory/Perceptual disturbances:   WNL  Orientation: oriented to person, place, time/date, and situation  Attention: Good  Concentration: Good  Memory: WNL  Fund of knowledge:  Good  Insight:   Good  Judgment:  Good  Impulse Control: Good   Risk Assessment: Danger to Self:  No Self-injurious Behavior: No Danger to Others: No Duty to Warn:no Physical Aggression / Violence:No   Subjective: Counselor assessed pt current functioning per pt report.  Processed w/pt recent stressors and mood.  Explored impact of recent stressors and increased awareness provided.  Discussed pt options and pt values and insights gained and next steps.   Pt affect wnl.  Pt reported Monday was a very stressful day w/ husband's new dx of emphysema, daughter's unplanned day off and awareness of job flexibility currently.  Pt discussed increased awareness of need to be able to be present and respond to family needs.  Pt expressed concern w/ upcoming job change that would limit her being present to family.  Pt discussed how increased insight has her considering potential of remaining w/ current employer.  Pt discussed plans for meeting current supervisor and HR to explore options.      Interventions: Cognitive Behavioral Therapy and ACT and supportive  Diagnosis:Bipolar I disorder ([place])  Plan: Pt to f/u w/ counseling  in 1 week.   Pt to f/u w/ [name] as scheduled.  Treatment Plan 01/27/22   Client Abilities/Strengths  supports: family- partner and mom, friends  positives: self awareness, expressing feelings, advocating for self. Motivated, hardworking, intelligent, family centered   Client Treatment Preferences  Weekly to biweekly counseling and continue medication management [name].    Client Statement of Needs  pt "to preserve my mental health.  Not be afraid to dream big.  Be able to continue to improve. Maintain compliance with my therapy, medications, holistic approaches.  I don't want to go back to where I was in the past"   Treatment Level  outpatient counseling    Symptoms  death of father 03/19/21.  Husband dx w/ heart failure 11/2021. Grief and anxiety. History of at least one hypomanic, manic, or mixed mood episode.Marland Kitchen History of chronic or recurrent depression for which the client has taken antidepressant medication and had outpatient treatment,     Goals 1. Begin a healthy grieving process around the loss.   Objective Begin verbalizing feelings associated with the loss. Target Date: 2023-01-28 Frequency: Daily  Progress: 50 Modality: individual  Related Interventions Assist the client in identifying and expressing feelings connected with his/her loss.   2. continue to use calming skills to assist coping w/ stress and managing mood Objective Maintain positive self care, use of mindfulness and other grounding activities to maintain mood stability. Target Date: 2023-01-28 Frequency: Daily  Progress: 70 Modality: individual  Related Interventions Assist the client in establishing a routine pattern of  daily activities such as sleeping, eating, solitary and social activities, and exercise; use of relaxation and grounding strategies routine and assist in assessing mood.   3. Develop healthy cognitive patterns and beliefs about self and the world that lead to alleviation and help  prevent the relapse of mood episodes.   Objective Identify and replace thoughts and behaviors that trigger manic or depressive symptoms. Target Date: 2023-01-28 Frequency: Daily  Progress: 60 Modality: individual  Related Interventions utilize CBT strategies to assist in challenging negative thought patterns and beliefs and ACT strategies to assist in actions, decisions and interactions consistent w/values.   Client participated in Treatment plan development and provided verbal consent          [name], [facility]

## 2022-07-22 ENCOUNTER — Ambulatory Visit (INDEPENDENT_AMBULATORY_CARE_PROVIDER_SITE_OTHER): Payer: BC Managed Care – PPO | Admitting: Psychology

## 2022-07-22 DIAGNOSIS — D3501 Benign neoplasm of right adrenal gland: Secondary | ICD-10-CM | POA: Diagnosis not present

## 2022-07-22 DIAGNOSIS — E1165 Type 2 diabetes mellitus with hyperglycemia: Secondary | ICD-10-CM | POA: Diagnosis not present

## 2022-07-22 DIAGNOSIS — F319 Bipolar disorder, unspecified: Secondary | ICD-10-CM

## 2022-07-22 DIAGNOSIS — I10 Essential (primary) hypertension: Secondary | ICD-10-CM | POA: Diagnosis not present

## 2022-07-22 DIAGNOSIS — Z794 Long term (current) use of insulin: Secondary | ICD-10-CM | POA: Diagnosis not present

## 2022-07-22 DIAGNOSIS — E785 Hyperlipidemia, unspecified: Secondary | ICD-10-CM | POA: Diagnosis not present

## 2022-07-22 NOTE — Progress Notes (Signed)
[place] Counselor/Therapist Progress Note  Patient ID: Christina [name], MRN: [id number],    Date: 07/22/2022  Time Spent: 8:00am-8:45am  Treatment Type: Individual Therapy  Pt is seen for a virtual video visit via caregility.    pt joins from her work office, Field seismologist, and counselor from her home office.  Reported Symptoms:  Pt increased ease about her decision, decreased anxiety Mental Status Exam: Appearance:  NA     Behavior: Appropriate  Motor: Normal  Speech/Language:  Normal Rate  Affect: Appropriate  Mood: normal  Thought process: normal  Thought content:   WNL  Sensory/Perceptual disturbances:   WNL  Orientation: oriented to person, place, time/date, and situation  Attention: Good  Concentration: Good  Memory: WNL  Fund of knowledge:  Good  Insight:   Good  Judgment:  Good  Impulse Control: Good   Risk Assessment: Danger to Self:  No Self-injurious Behavior: No Danger to Others: No Duty to Warn:no Physical Aggression / Violence:No   Subjective: Counselor assessed pt current functioning per pt report.  Processed w/pt decision re: job and impact on mood.  Explored how recent interactions/communication gave further insight and confirmed her decision.   Pt affect wnl.  Pt reported she meet w/ supervisor and HR and clear that not going to accommodate staying and pt recognized that she had made the right decision to move on from current employer.  Pt discussed how she feels more at ease and confident w/ upcoming change.  Pt is excited about her opportunity.  Pt discussed steps taking to wrap up w/ current job and move forward.  Interventions: Cognitive Behavioral Therapy and ACT and supportive  Diagnosis:Bipolar I disorder ([place])  Plan: Pt to f/u w/ counseling in 1 week.   Pt to f/u w/ [name] as scheduled.  Treatment Plan 01/27/22   Client Abilities/Strengths  supports: family- partner and mom, friends  positives: self awareness,  expressing feelings, advocating for self. Motivated, hardworking, intelligent, family centered   Client Treatment Preferences  Weekly to biweekly counseling and continue medication management [name].    Client Statement of Needs  pt "to preserve my mental health.  Not be afraid to dream big.  Be able to continue to improve. Maintain compliance with my therapy, medications, holistic approaches.  I don't want to go back to where I was in the past"   Treatment Level  outpatient counseling    Symptoms  death of father 2021/03/22.  Husband dx w/ heart failure 11/2021. Grief and anxiety. History of at least one hypomanic, manic, or mixed mood episode.Marland Kitchen History of chronic or recurrent depression for which the client has taken antidepressant medication and had outpatient treatment,     Goals 1. Begin a healthy grieving process around the loss.   Objective Begin verbalizing feelings associated with the loss. Target Date: 2023-01-28 Frequency: Daily  Progress: 50 Modality: individual  Related Interventions Assist the client in identifying and expressing feelings connected with his/her loss.   2. continue to use calming skills to assist coping w/ stress and managing mood Objective Maintain positive self care, use of mindfulness and other grounding activities to maintain mood stability. Target Date: 2023-01-28 Frequency: Daily  Progress: 70 Modality: individual  Related Interventions Assist the client in establishing a routine pattern of daily activities such as sleeping, eating, solitary and social activities, and exercise; use of relaxation and grounding strategies routine and assist in assessing mood.   3. Develop healthy cognitive patterns and  beliefs about self and the world that lead to alleviation and help prevent the relapse of mood episodes.   Objective Identify and replace thoughts and behaviors that trigger manic or depressive symptoms. Target Date: 2023-01-28 Frequency: Daily   Progress: 60 Modality: individual  Related Interventions utilize CBT strategies to assist in challenging negative thought patterns and beliefs and ACT strategies to assist in actions, decisions and interactions consistent w/values.   Client participated in Treatment plan development and provided verbal consent          [name] [place], [facility]

## 2022-07-30 ENCOUNTER — Ambulatory Visit: Payer: BC Managed Care – PPO | Admitting: Psychology

## 2022-07-30 DIAGNOSIS — F319 Bipolar disorder, unspecified: Secondary | ICD-10-CM

## 2022-07-30 NOTE — Progress Notes (Signed)
[place] Counselor/Therapist Progress Note  Patient ID: Christina [name], MRN: [id number],    Date: 07/30/2022  Time Spent: 3:30pm-4:15pm  Treatment Type: Individual Therapy  Pt is seen for a virtual audio visit via caregility.  Pt internet access preventing use of video.  pt joins from her home, reporting privacy, and counselor from her home office.  Reported Symptoms:  Pt increased feeling ease, increase hope and preparing for transition.  Mental Status Exam: Appearance:  NA     Behavior: Appropriate  Motor: Normal  Speech/Language:  Normal Rate  Affect: Appropriate  Mood: normal  Thought process: normal  Thought content:   WNL  Sensory/Perceptual disturbances:   WNL  Orientation: oriented to person, place, time/date, and situation  Attention: Good  Concentration: Good  Memory: WNL  Fund of knowledge:  Good  Insight:   Good  Judgment:  Good  Impulse Control: Good   Risk Assessment: Danger to Self:  No Self-injurious Behavior: No Danger to Others: No Duty to Warn:no Physical Aggression / Violence:No   Subjective: Counselor assessed pt current functioning per pt report.  Processed w/pt interactions w/ family and transition w/ work.  Explored positives of return to church and avoidance of her church.  Reflected positives and her goals w/ decreased avoidance.  Discussed setting boundaries for self w/ leaving current employer and opportunities ahead.   Pt affect wnl.  Pt reported holidays were good and discussed time w/ family and return to church.  Pt reflected on avoidance of return to church following funeral and how has made more difficult. Pt discussed positves of connection w/ community and wants to continue.  Pt discussed goal to return to her church in Jan 2024. Pt discussed some interactions w/ current employer and reconfirming time to move on. Pt discussed feeling more hopeful w/ opportunities for self and her family.    Interventions: Cognitive  Behavioral Therapy and ACT and supportive  Diagnosis:Bipolar I disorder ([place])  Plan: Pt to f/u w/ counseling in 1 week.   Pt to f/u w/ [name] as scheduled.  Treatment Plan 01/27/22   Client Abilities/Strengths  supports: family- partner and mom, friends  positives: self awareness, expressing feelings, advocating for self. Motivated, hardworking, intelligent, family centered   Client Treatment Preferences  Weekly to biweekly counseling and continue medication management [name].    Client Statement of Needs  pt "to preserve my mental health.  Not be afraid to dream big.  Be able to continue to improve. Maintain compliance with my therapy, medications, holistic approaches.  I don't want to go back to where I was in the past"   Treatment Level  outpatient counseling    Symptoms  death of father 04-14-21.  Husband dx w/ heart failure 11/2021. Grief and anxiety. History of at least one hypomanic, manic, or mixed mood episode.Marland Kitchen History of chronic or recurrent depression for which the client has taken antidepressant medication and had outpatient treatment,     Goals 1. Begin a healthy grieving process around the loss.   Objective Begin verbalizing feelings associated with the loss. Target Date: 2023-01-28 Frequency: Daily  Progress: 50 Modality: individual  Related Interventions Assist the client in identifying and expressing feelings connected with his/her loss.   2. continue to use calming skills to assist coping w/ stress and managing mood Objective Maintain positive self care, use of mindfulness and other grounding activities to maintain mood stability. Target Date: 2023-01-28 Frequency: Daily  Progress: 70 Modality: individual  Related Interventions Assist the client in establishing a routine pattern of daily activities such as sleeping, eating, solitary and social activities, and exercise; use of relaxation and grounding strategies routine and assist in assessing mood.   3.  Develop healthy cognitive patterns and beliefs about self and the world that lead to alleviation and help prevent the relapse of mood episodes.   Objective Identify and replace thoughts and behaviors that trigger manic or depressive symptoms. Target Date: 2023-01-28 Frequency: Daily  Progress: 60 Modality: individual  Related Interventions utilize CBT strategies to assist in challenging negative thought patterns and beliefs and ACT strategies to assist in actions, decisions and interactions consistent w/values.   Client participated in Treatment plan development and provided verbal consent          [name], [facility]

## 2022-08-03 ENCOUNTER — Other Ambulatory Visit (HOSPITAL_COMMUNITY): Payer: Self-pay | Admitting: Psychiatry

## 2022-08-03 DIAGNOSIS — F419 Anxiety disorder, unspecified: Secondary | ICD-10-CM

## 2022-08-03 DIAGNOSIS — F3162 Bipolar disorder, current episode mixed, moderate: Secondary | ICD-10-CM

## 2022-08-12 ENCOUNTER — Ambulatory Visit (INDEPENDENT_AMBULATORY_CARE_PROVIDER_SITE_OTHER): Payer: BC Managed Care – PPO | Admitting: Psychology

## 2022-08-12 DIAGNOSIS — F319 Bipolar disorder, unspecified: Secondary | ICD-10-CM | POA: Diagnosis not present

## 2022-08-12 NOTE — Progress Notes (Signed)
[place] Counselor/Therapist Progress Note  Patient ID: Christina [name], MRN: [id number],    Date: 08/12/2022  Time Spent: 12:03pm-12:47pm  Treatment Type: Individual Therapy  Pt is seen for a virtual audio visit via caregility.  Pt internet access preventing use of video.  pt joins from her car at work, Field seismologist, and counselor from her home office.  Reported Symptoms:  Pt reports feeling refreshed, pt reports settling into new job.  Mental Status Exam: Appearance:  NA     Behavior: Appropriate  Motor: Normal  Speech/Language:  Normal Rate  Affect: Appropriate  Mood: normal  Thought process: normal  Thought content:   WNL  Sensory/Perceptual disturbances:   WNL  Orientation: oriented to person, place, time/date, and situation  Attention: Good  Concentration: Good  Memory: WNL  Fund of knowledge:  Good  Insight:   Good  Judgment:  Good  Impulse Control: Good   Risk Assessment: Danger to Self:  No Self-injurious Behavior: No Danger to Others: No Duty to Warn:no Physical Aggression / Violence:No   Subjective: Counselor assessed pt current functioning per pt report.  Processed w/pt transition to new job.  Explored w/ pt mood and positives w/ this change.  Discussed snew routines and changes for family.  Pt affect wnl.  Pt reported on positives of starting new job.  Pt discussed feeling refreshed and recognizing positives of being in place that can utilize her skills and make an impact.  Pt reported on routines changing for her and family and adjusting.  Pt reported on being dad's birthday today and feeling positive about his fraternity reaching out and wanting family presence at celebration.    Interventions: Cognitive Behavioral Therapy and ACT and supportive  Diagnosis:Bipolar I disorder ([place])  Plan: Pt to f/u w/ counseling in 2 weeks.   Pt to f/u w/ [name] as scheduled.  Treatment Plan 01/27/22   Client Abilities/Strengths  supports:  family- partner and mom, friends  positives: self awareness, expressing feelings, advocating for self. Motivated, hardworking, intelligent, family centered   Client Treatment Preferences  Weekly to biweekly counseling and continue medication management [name].    Client Statement of Needs  pt "to preserve my mental health.  Not be afraid to dream big.  Be able to continue to improve. Maintain compliance with my therapy, medications, holistic approaches.  I don't want to go back to where I was in the past"   Treatment Level  outpatient counseling    Symptoms  death of father 2021/03/30.  Husband dx w/ heart failure 11/2021. Grief and anxiety. History of at least one hypomanic, manic, or mixed mood episode.Marland Kitchen History of chronic or recurrent depression for which the client has taken antidepressant medication and had outpatient treatment,     Goals 1. Begin a healthy grieving process around the loss.   Objective Begin verbalizing feelings associated with the loss. Target Date: 2023-01-28 Frequency: Daily  Progress: 50 Modality: individual  Related Interventions Assist the client in identifying and expressing feelings connected with his/her loss.   2. continue to use calming skills to assist coping w/ stress and managing mood Objective Maintain positive self care, use of mindfulness and other grounding activities to maintain mood stability. Target Date: 2023-01-28 Frequency: Daily  Progress: 70 Modality: individual  Related Interventions Assist the client in establishing a routine pattern of daily activities such as sleeping, eating, solitary and social activities, and exercise; use of relaxation and grounding strategies routine and assist in  assessing mood.   3. Develop healthy cognitive patterns and beliefs about self and the world that lead to alleviation and help prevent the relapse of mood episodes.   Objective Identify and replace thoughts and behaviors that trigger manic or  depressive symptoms. Target Date: 2023-01-28 Frequency: Daily  Progress: 60 Modality: individual  Related Interventions utilize CBT strategies to assist in challenging negative thought patterns and beliefs and ACT strategies to assist in actions, decisions and interactions consistent w/values.   Client participated in Treatment plan development and provided verbal consent         [name], [facility]

## 2022-08-20 ENCOUNTER — Telehealth (HOSPITAL_BASED_OUTPATIENT_CLINIC_OR_DEPARTMENT_OTHER): Payer: BC Managed Care – PPO | Admitting: Psychiatry

## 2022-08-20 ENCOUNTER — Encounter (HOSPITAL_COMMUNITY): Payer: Self-pay | Admitting: Psychiatry

## 2022-08-20 DIAGNOSIS — F419 Anxiety disorder, unspecified: Secondary | ICD-10-CM

## 2022-08-20 DIAGNOSIS — F3162 Bipolar disorder, current episode mixed, moderate: Secondary | ICD-10-CM | POA: Diagnosis not present

## 2022-08-20 MED ORDER — ARIPIPRAZOLE 5 MG PO TABS: 5.0000 mg | ORAL_TABLET | Freq: Every evening | ORAL | 0 refills | Status: DC

## 2022-08-20 NOTE — Progress Notes (Signed)
Virtual Visit via Video Note  I connected with Christina [name] on 08/20/22 at  8:40 AM EST by a video enabled telemedicine application and verified that I am speaking with the correct person using two identifiers.  Location: Patient: In Car Provider: Home Office   I discussed the limitations of evaluation and management by telemedicine and the availability of in person appointments. The patient expressed understanding and agreed to proceed.  History of Present Illness: Patient is evaluated by video session.  She is having flulike symptoms and going to see the doctor today.  Overall she feels things are going very well.  She excited about the new job.  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  She is happy because she has to ride the [place] and supervises other people.  She has more money.  She feels things are going very well.  She denies any mania, psychosis, hallucination.  Patient told Christmas was okay but kids were sick.  Her daughter required procedure for her ear and she is scheduled for surgery with Dr. Alcide Goodness.  Patient denies any suicidal thoughts or homicidal thoughts.  She lives with her husband and they have a 40, 6 and 77-year-old.  Patient admitted blood sugar increase and jump from 6.9-7.7.  Patient told despite taking Ozempic blood sugar increased because she is not watching her diet and admitted she thought Ozempic will take care of everything but realizes she need to cut down her sweets.  She lost few pounds since the last visit.  She has no tremors, shakes or any EPS.  She is compliant with Abilify.   Past Psychiatric History:  H/O depression, anxiety, mania and psychosis.  Admitted in 1997 at [facility] due to suicidal gestures.  Seen in this office since 2006. Tried Prozac and Wellbutrin.     Psychiatric Specialty Exam: Physical Exam  Review of Systems  Weight 197 lb (89.4 kg), unknown if currently breastfeeding.There is no height or weight on file to calculate BMI.   General Appearance: Casual  Eye Contact:  Good  Speech:  Normal Rate  Volume:  Normal  Mood:  Euthymic  Affect:  Congruent  Thought Process:  Goal Directed  Orientation:  Full (Time, Place, and Person)  Thought Content:  Logical  Suicidal Thoughts:  No  Homicidal Thoughts:  No  Memory:  Immediate;   Good Recent;   Good Remote;   Good  Judgement:  Good  Insight:  Present  Psychomotor Activity:  Normal  Concentration:  Concentration: Good and Attention Span: Good  Recall:  Good  Fund of Knowledge:  Good  Language:  Good  Akathisia:  No  Handed:  Right  AIMS (if indicated):     Assets:  Communication Skills [employer] for [place] [employer]  ADL's:  Intact  Cognition:  WNL  Sleep:   ok      Assessment and Plan: Bipolar disorder type I.  Anxiety.  Patient's symptoms are stable however today she has flu/cold symptoms.  She admitted not keeping her diet check and hemoglobin A1c increased from the past.  She is happy with the new job.  Discussed medication side effects and benefits.  Continue Abilify 5 mg daily.  Recommend to call us back if there is any question or any concern.  Follow-up in 3 months.  Follow Up Instructions:    I discussed the assessment and treatment plan with the patient. The patient was provided an opportunity to ask questions  and all were answered. The patient agreed with the plan and demonstrated an understanding of the instructions.   The patient was advised to call back or seek an in-person evaluation if the symptoms worsen or if the condition fails to improve as anticipated.  Collaboration of Care: Other provider involved in patient's care AEB notes are available in epic to review.  Patient/Guardian was advised Release of Information must be obtained prior to any record release in order to collaborate their care with an outside provider. Patient/Guardian was advised if they have not already  done so to contact the registration department to sign all necessary forms in order for [place] to release information regarding their care.   Consent: Patient/Guardian gives verbal consent for treatment and assignment of benefits for services provided during this visit. Patient/Guardian expressed understanding and agreed to proceed.    I provided 14 minutes of non-face-to-face time during this encounter.   Kathlee Nations, MD

## 2022-08-21 ENCOUNTER — Other Ambulatory Visit: Payer: Self-pay | Admitting: Family Medicine

## 2022-08-22 DIAGNOSIS — J019 Acute sinusitis, unspecified: Secondary | ICD-10-CM | POA: Diagnosis not present

## 2022-08-25 ENCOUNTER — Ambulatory Visit (INDEPENDENT_AMBULATORY_CARE_PROVIDER_SITE_OTHER): Payer: BC Managed Care – PPO | Admitting: Psychology

## 2022-08-25 DIAGNOSIS — F319 Bipolar disorder, unspecified: Secondary | ICD-10-CM | POA: Diagnosis not present

## 2022-08-25 NOTE — Progress Notes (Signed)
[place] Counselor/Therapist Progress Note  Patient ID: HENNESSEY CANTRELL, MRN: [id number],    Date: 08/25/2022  Time Spent: 12:17pm-12:47pm  Treatment Type: Individual Therapy  Pt is seen for a virtual video visit via caregility.   pt joins from her car at work, Field seismologist, and counselor from her home office.  Reported Symptoms:  Pt reports feeling cloudy head and sick w/ illness.  Pt reports some negative self talk.  Mental Status Exam: Appearance:  Well Groomed     Behavior: Appropriate  Motor: Normal  Speech/Language:  Normal Rate  Affect: Appropriate  Mood: dysthymic  Thought process: normal  Thought content:   WNL  Sensory/Perceptual disturbances:   WNL  Orientation: oriented to person, place, time/date, and situation  Attention: Good  Concentration: Good  Memory: WNL  Fund of knowledge:  Good  Insight:   Good  Judgment:  Good  Impulse Control: Good   Risk Assessment: Danger to Self:  No Self-injurious Behavior: No Danger to Others: No Duty to Warn:no Physical Aggression / Violence:No   Subjective: Counselor assessed pt current functioning per pt report.  Processed w/pt impact of feeling sick and awareness of negative self talk related.  Assisted pt w/ identifying negative self talk and reframing.   Pt affect congruent/w report of illness.  Pt reported that husband and children all have the flu and some w/ other infections.  Pt reports she is negative for flu w/ has sinus infection and being tx for. Pt reports some struggles w/ feeling cloudy headed and thoughts of imposter syndrome.  Pt is able to acknowledge negative self talk and distortion. Pt is able to challenge and reframe and give self permission to be less than 100/% w/ not feeling well. 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  Pt reports f/u w/ [name] last week and no changes to meds..    Interventions: Cognitive  Behavioral Therapy and ACT and supportive  Diagnosis:Bipolar I disorder ([place])  Plan: Pt to f/u w/ counseling in 2 weeks.   Pt to f/u w/ [name] as scheduled.  Treatment Plan 01/27/22   Client Abilities/Strengths  supports: family- partner and mom, friends  positives: self awareness, expressing feelings, advocating for self. Motivated, hardworking, intelligent, family centered   Client Treatment Preferences  Weekly to biweekly counseling and continue medication management [name].    Client Statement of Needs  pt "to preserve my mental health.  Not be afraid to dream big.  Be able to continue to improve. Maintain compliance with my therapy, medications, holistic approaches.  I don't want to go back to where I was in the past"   Treatment Level  outpatient counseling    Symptoms  death of father 2021-04-04.  Husband dx w/ heart failure 11/2021. Grief and anxiety. History of at least one hypomanic, manic, or mixed mood episode.Marland Kitchen History of chronic or recurrent depression for which the client has taken antidepressant medication and had outpatient treatment,     Goals 1. Begin a healthy grieving process around the loss.   Objective Begin verbalizing feelings associated with the loss. Target Date: 2023-01-28 Frequency: Daily  Progress: 50 Modality: individual  Related Interventions Assist the client in identifying and expressing feelings connected with his/her loss.   2. continue to use calming skills to assist coping w/ stress and managing mood Objective Maintain positive self care, use of mindfulness and other grounding activities to maintain  mood stability. Target Date: 2023-01-28 Frequency: Daily  Progress: 70 Modality: individual  Related Interventions Assist the client in establishing a routine pattern of daily activities such as sleeping, eating, solitary and social activities, and exercise; use of relaxation and grounding strategies routine and assist in assessing mood.    3. Develop healthy cognitive patterns and beliefs about self and the world that lead to alleviation and help prevent the relapse of mood episodes.   Objective Identify and replace thoughts and behaviors that trigger manic or depressive symptoms. Target Date: 2023-01-28 Frequency: Daily  Progress: 60 Modality: individual  Related Interventions utilize CBT strategies to assist in challenging negative thought patterns and beliefs and ACT strategies to assist in actions, decisions and interactions consistent w/values.   Client participated in Treatment plan development and provided verbal consent      [name], [facility]

## 2022-08-31 ENCOUNTER — Ambulatory Visit: Payer: BC Managed Care – PPO | Admitting: Psychology

## 2022-09-07 ENCOUNTER — Encounter: Payer: Self-pay | Admitting: Internal Medicine

## 2022-09-09 ENCOUNTER — Other Ambulatory Visit: Payer: Self-pay | Admitting: Gastroenterology

## 2022-09-09 DIAGNOSIS — K219 Gastro-esophageal reflux disease without esophagitis: Secondary | ICD-10-CM

## 2022-09-11 ENCOUNTER — Ambulatory Visit: Payer: BC Managed Care – PPO | Admitting: Family Medicine

## 2022-09-15 ENCOUNTER — Ambulatory Visit (INDEPENDENT_AMBULATORY_CARE_PROVIDER_SITE_OTHER): Payer: PRIVATE HEALTH INSURANCE | Admitting: Psychology

## 2022-09-15 DIAGNOSIS — F319 Bipolar disorder, unspecified: Secondary | ICD-10-CM

## 2022-09-15 NOTE — Progress Notes (Signed)
[place] Counselor/Therapist Progress Note  Patient ID: Christina [name], MRN: [id number],    Date: 09/15/2022  Time Spent: 12:01pm-12:46pm  Treatment Type: Individual Therapy  Pt is seen for a virtual video visit via caregility.   pt joins from her car at work, Field seismologist, and counselor from her home office.  Reported Symptoms:  Pt reports focused on doing something for self each week for self care  Mental Status Exam: Appearance:  Well Groomed     Behavior: Appropriate  Motor: Normal  Speech/Language:  Normal Rate  Affect: Appropriate  Mood: normal  Thought process: normal  Thought content:   WNL  Sensory/Perceptual disturbances:   WNL  Orientation: oriented to person, place, time/date, and situation  Attention: Good  Concentration: Good  Memory: WNL  Fund of knowledge:  Good  Insight:   Good  Judgment:  Good  Impulse Control: Good   Risk Assessment: Danger to Self:  No Self-injurious Behavior: No Danger to Others: No Duty to Warn:no Physical Aggression / Violence:No   Subjective: Counselor assessed pt current functioning per pt report.  Processed w/pt stressors and coping.  Normalized feeling 'mom guilt' and assisted in identifying related thoughts and how to reframe for importance of self care/positives for self.   Pt affect wnl.  Pt reports she has been busy w/ work, caring for family, activities w/ kids etc.  Pt recognized that she needs to be taking care of self and doing things for herself each week.  Pt reports guilt that feels as "other things could be doing at home/for kids- however also recognized importance of her wellness as mom.  Pt reports some frustration w/ husband's decisions w/ his health and that can't make changes for him.    Interventions: Cognitive Behavioral Therapy and ACT and supportive  Diagnosis:Bipolar I disorder ([place])  Plan: Pt to f/u w/ counseling in 1 week.   Pt to f/u w/ [name] as scheduled.   Treatment Plan  01/27/22   Client Abilities/Strengths  supports: family- partner and mom, friends  positives: self awareness, expressing feelings, advocating for self. Motivated, hardworking, intelligent, family centered   Client Treatment Preferences  Weekly to biweekly counseling and continue medication management [name].    Client Statement of Needs  pt "to preserve my mental health.  Not be afraid to dream big.  Be able to continue to improve. Maintain compliance with my therapy, medications, holistic approaches.  I don't want to go back to where I was in the past"   Treatment Level  outpatient counseling    Symptoms  death of father 03-23-2021.  Husband dx w/ heart failure 11/2021. Grief and anxiety. History of at least one hypomanic, manic, or mixed mood episode.Marland Kitchen History of chronic or recurrent depression for which the client has taken antidepressant medication and had outpatient treatment,     Goals 1. Begin a healthy grieving process around the loss.   Objective Begin verbalizing feelings associated with the loss. Target Date: 2023-01-28 Frequency: Daily  Progress: 50 Modality: individual  Related Interventions Assist the client in identifying and expressing feelings connected with his/her loss.   2. continue to use calming skills to assist coping w/ stress and managing mood Objective Maintain positive self care, use of mindfulness and other grounding activities to maintain mood stability. Target Date: 2023-01-28 Frequency: Daily  Progress: 70 Modality: individual  Related Interventions Assist the client in establishing a routine pattern of daily activities such as sleeping, eating, solitary and  social activities, and exercise; use of relaxation and grounding strategies routine and assist in assessing mood.   3. Develop healthy cognitive patterns and beliefs about self and the world that lead to alleviation and help prevent the relapse of mood episodes.   Objective Identify and replace  thoughts and behaviors that trigger manic or depressive symptoms. Target Date: 2023-01-28 Frequency: Daily  Progress: 60 Modality: individual  Related Interventions utilize CBT strategies to assist in challenging negative thought patterns and beliefs and ACT strategies to assist in actions, decisions and interactions consistent w/values.   Client participated in Treatment plan development and provided verbal consent      [name], [facility]

## 2022-09-21 ENCOUNTER — Ambulatory Visit: Payer: PRIVATE HEALTH INSURANCE | Admitting: Psychology

## 2022-09-23 ENCOUNTER — Encounter: Payer: Self-pay | Admitting: Family Medicine

## 2022-09-23 DIAGNOSIS — D3501 Benign neoplasm of right adrenal gland: Secondary | ICD-10-CM

## 2022-09-23 DIAGNOSIS — Z794 Long term (current) use of insulin: Secondary | ICD-10-CM

## 2022-09-24 ENCOUNTER — Encounter: Payer: Self-pay | Admitting: Family Medicine

## 2022-09-24 ENCOUNTER — Other Ambulatory Visit: Payer: Self-pay

## 2022-09-24 MED ORDER — ROSUVASTATIN CALCIUM 5 MG PO TABS: ORAL_TABLET | ORAL | 2 refills | Status: DC

## 2022-09-24 MED ORDER — METFORMIN HCL 1000 MG PO TABS: 1000.0000 mg | ORAL_TABLET | Freq: Two times a day (BID) | ORAL | 2 refills | Status: DC

## 2022-09-24 MED ORDER — MONTELUKAST SODIUM 10 MG PO TABS: 10.0000 mg | ORAL_TABLET | Freq: Every day | ORAL | 2 refills | Status: DC

## 2022-09-24 MED ORDER — LOSARTAN POTASSIUM 50 MG PO TABS: 50.0000 mg | ORAL_TABLET | Freq: Every day | ORAL | 2 refills | Status: DC

## 2022-09-25 ENCOUNTER — Ambulatory Visit: Payer: BC Managed Care – PPO | Admitting: Family Medicine

## 2022-09-28 ENCOUNTER — Ambulatory Visit: Payer: BC Managed Care – PPO | Admitting: Gastroenterology

## 2022-10-08 ENCOUNTER — Ambulatory Visit (INDEPENDENT_AMBULATORY_CARE_PROVIDER_SITE_OTHER): Payer: PRIVATE HEALTH INSURANCE | Admitting: Psychology

## 2022-10-08 DIAGNOSIS — F319 Bipolar disorder, unspecified: Secondary | ICD-10-CM

## 2022-10-08 NOTE — Progress Notes (Signed)
[place] Counselor/Therapist Progress Note  Patient ID: Christina [name], MRN: [id number],    Date: 10/08/2022  Time Spent: 12:00pm-12:46pm  Treatment Type: Individual Therapy  Pt is seen for a virtual video visit via caregility.   pt joins from her car at work, Field seismologist, and counselor from her home office.  Reported Symptoms:  Pt reports stressors with husband's health, focused on selfcare  Mental Status Exam: Appearance:  Well Groomed     Behavior: Appropriate  Motor: Normal  Speech/Language:  Normal Rate  Affect: Appropriate  Mood: anxious  Thought process: normal  Thought content:   WNL  Sensory/Perceptual disturbances:   WNL  Orientation: oriented to person, place, time/date, and situation  Attention: Good  Concentration: Good  Memory: WNL  Fund of knowledge:  Good  Insight:   Good  Judgment:  Good  Impulse Control: Good   Risk Assessment: Danger to Self:  No Self-injurious Behavior: No Danger to Others: No Duty to Warn:no Physical Aggression / Violence:No   Subjective: Counselor assessed pt current functioning per pt report.  Processed w/pt family  stressors.  Assisted w/ acknowledging emotions re: and needs for self and family.  Discussed self care- keeping boundaries to not take on more and prioritize time for self each week.  Pt affect wnl.  Pt reports that was difficult to see her husband and his limitations at recent outings.  Pt also discussed how he had to face and cardiologist discussed limitations.  Pt acknowledged impact that has for family and worry/anxiety about.  Pt reported she is working w/ [employer] for finding family counselor. Pt discussed that she did do yoga and enjoyed but also timing not working out.  Pt reported on other options and exploring things for weekly self care and reiterated for self that she needs to prioritize.    Interventions: Cognitive Behavioral Therapy and ACT and supportive  Diagnosis:Bipolar I disorder  ([place])  Plan: Pt to f/u w/ counseling in 1 week.   Pt to f/u w/ [name] as scheduled.   Treatment Plan 01/27/22   Client Abilities/Strengths  supports: family- partner and mom, friends  positives: self awareness, expressing feelings, advocating for self. Motivated, hardworking, intelligent, family centered   Client Treatment Preferences  Weekly to biweekly counseling and continue medication management [name].    Client Statement of Needs  pt "to preserve my mental health.  Not be afraid to dream big.  Be able to continue to improve. Maintain compliance with my therapy, medications, holistic approaches.  I don't want to go back to where I was in the past"   Treatment Level  outpatient counseling    Symptoms  death of father 03/16/2021.  Husband dx w/ heart failure 11/2021. Grief and anxiety. History of at least one hypomanic, manic, or mixed mood episode.Marland Kitchen History of chronic or recurrent depression for which the client has taken antidepressant medication and had outpatient treatment,     Goals 1. Begin a healthy grieving process around the loss.   Objective Begin verbalizing feelings associated with the loss. Target Date: 2023-01-28 Frequency: Daily  Progress: 50 Modality: individual  Related Interventions Assist the client in identifying and expressing feelings connected with his/her loss.   2. continue to use calming skills to assist coping w/ stress and managing mood Objective Maintain positive self care, use of mindfulness and other grounding activities to maintain mood stability. Target Date: 2023-01-28 Frequency: Daily  Progress: 70 Modality: individual  Related Interventions Assist the  client in establishing a routine pattern of daily activities such as sleeping, eating, solitary and social activities, and exercise; use of relaxation and grounding strategies routine and assist in assessing mood.   3. Develop healthy cognitive patterns and beliefs about self and the world  that lead to alleviation and help prevent the relapse of mood episodes.   Objective Identify and replace thoughts and behaviors that trigger manic or depressive symptoms. Target Date: 2023-01-28 Frequency: Daily  Progress: 60 Modality: individual  Related Interventions utilize CBT strategies to assist in challenging negative thought patterns and beliefs and ACT strategies to assist in actions, decisions and interactions consistent w/values.   Client participated in Treatment plan development and provided verbal consent         [name], [facility]

## 2022-10-15 ENCOUNTER — Ambulatory Visit: Payer: PRIVATE HEALTH INSURANCE | Admitting: Psychology

## 2022-10-15 DIAGNOSIS — F319 Bipolar disorder, unspecified: Secondary | ICD-10-CM | POA: Diagnosis not present

## 2022-10-15 NOTE — Progress Notes (Signed)
[place] Counselor/Therapist Progress Note  Patient ID: Christina [name], MRN: [id number],    Date: 10/15/2022  Time Spent: 12:07pm-12:47pm  Treatment Type: Individual Therapy  Pt is seen for a virtual video visit via caregility.   pt joins from her car at work, Field seismologist, and counselor from her office.  Reported Symptoms:  Pt reports stressors w/ husband's health and recent argument Mental Status Exam: Appearance:  Well Groomed     Behavior: Appropriate  Motor: Normal  Speech/Language:  Normal Rate  Affect: Appropriate  Mood: sad  Thought process: normal  Thought content:   WNL  Sensory/Perceptual disturbances:   WNL  Orientation: oriented to person, place, time/date, and situation  Attention: Good  Concentration: Good  Memory: WNL  Fund of knowledge:  Good  Insight:   Good  Judgment:  Good  Impulse Control: Good   Risk Assessment: Danger to Self:  No Self-injurious Behavior: No Danger to Others: No Duty to Warn:no Physical Aggression / Violence:No   Subjective: Counselor assessed pt current functioning per pt report.  Processed w/pt mood and stressors.  Assisted w/ acknowledging emotions.  Discussed communication in relationship and encouraging expressing wants and feelings.  Discussed self care and keeping priority.  Pt affect wnl.  Pt reports on argument w/ husband last night and how felt that he was looking to "pick a fight".  Pt identified contributing factors.  Pt discussed communication and ways of focus on effective communication.  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  Pt discussed next steps.  Pt reported on some possibilities for self care in next couple of weeks.   Interventions: Cognitive Behavioral Therapy and ACT and supportive  Diagnosis:Bipolar I disorder ([place])  Plan: Pt to f/u w/ counseling in 1 week.   Pt to f/u w/ [name] as scheduled.   Treatment Plan 01/27/22   Client  Abilities/Strengths  supports: family- partner and mom, friends  positives: self awareness, expressing feelings, advocating for self. Motivated, hardworking, intelligent, family centered   Client Treatment Preferences  Weekly to biweekly counseling and continue medication management [name].    Client Statement of Needs  pt "to preserve my mental health.  Not be afraid to dream big.  Be able to continue to improve. Maintain compliance with my therapy, medications, holistic approaches.  I don't want to go back to where I was in the past"   Treatment Level  outpatient counseling    Symptoms  death of father 18-Apr-2021.  Husband dx w/ heart failure 11/2021. Grief and anxiety. History of at least one hypomanic, manic, or mixed mood episode.Marland Kitchen History of chronic or recurrent depression for which the client has taken antidepressant medication and had outpatient treatment,     Goals 1. Begin a healthy grieving process around the loss.   Objective Begin verbalizing feelings associated with the loss. Target Date: 2023-01-28 Frequency: Daily  Progress: 50 Modality: individual  Related Interventions Assist the client in identifying and expressing feelings connected with his/her loss.   2. continue to use calming skills to assist coping w/ stress and managing mood Objective Maintain positive self care, use of mindfulness and other grounding activities to maintain mood stability. Target Date: 2023-01-28 Frequency: Daily  Progress: 70 Modality: individual  Related Interventions Assist the client in establishing a routine pattern of daily activities such as sleeping, eating, solitary and social activities, and exercise; use of relaxation and grounding strategies routine and assist in  assessing mood.   3. Develop healthy cognitive patterns and beliefs about self and the world that lead to alleviation and help prevent the relapse of mood episodes.   Objective Identify and replace thoughts and  behaviors that trigger manic or depressive symptoms. Target Date: 2023-01-28 Frequency: Daily  Progress: 60 Modality: individual  Related Interventions utilize CBT strategies to assist in challenging negative thought patterns and beliefs and ACT strategies to assist in actions, decisions and interactions consistent w/values.   Client participated in Treatment plan development and provided verbal consent      [name], [facility]

## 2022-10-20 ENCOUNTER — Ambulatory Visit: Payer: PRIVATE HEALTH INSURANCE | Admitting: Psychology

## 2022-10-20 DIAGNOSIS — F319 Bipolar disorder, unspecified: Secondary | ICD-10-CM

## 2022-10-20 NOTE — Progress Notes (Signed)
[place] Counselor/Therapist Progress Note  Patient ID: Christina [name], MRN: [id number],    Date: 10/20/2022  Time Spent: 11:00am-11:46am  Treatment Type: Individual Therapy  Pt is seen for a virtual video visit via caregility.   pt joins from her home, reporting privacy, and counselor from her office.  Reported Symptoms:  Pt reports worry/anxiety Mental Status Exam: Appearance:  Well Groomed     Behavior: Appropriate  Motor: Normal  Speech/Language:  Normal Rate  Affect: Appropriate  Mood: anxious  Thought process: normal  Thought content:   WNL  Sensory/Perceptual disturbances:   WNL  Orientation: oriented to person, place, time/date, and situation  Attention: Good  Concentration: Good  Memory: WNL  Fund of knowledge:  Good  Insight:   Good  Judgment:  Good  Impulse Control: Good   Risk Assessment: Danger to Self:  No Self-injurious Behavior: No Danger to Others: No Duty to Warn:no Physical Aggression / Violence:No   Subjective: Counselor assessed pt current functioning per pt report.  Processed w/pt mood and stressors.  Reflected anxiety and worry and awareness of changes for self/family.  Discussed avoidance pattern of communication in relationship. Processed decision facing re: summer grad class or not and assisted in identifying and reframing distortions.   Pt affect wnl.  Pt reports avoidance in communication re: argument/relationship w/ husband.  Pt reports aware of her own worry anxiety about husband his health/limitations/not pushing his limitations.  Pt discussed her awareness of changing expectations.  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  Pt discussed difficulty letting go of completing her grad program but awareness that things in her life very different than when started.  Pt able to acknowledged and reframe distortions  around failing.    Interventions: Cognitive Behavioral Therapy and ACT and supportive  Diagnosis:Bipolar I disorder ([place])  Plan: Pt to f/u w/ counseling in 1 week.   Pt to f/u w/ [name] as scheduled.   Treatment Plan 01/27/22   Client Abilities/Strengths  supports: family- partner and mom, friends  positives: self awareness, expressing feelings, advocating for self. Motivated, hardworking, intelligent, family centered   Client Treatment Preferences  Weekly to biweekly counseling and continue medication management [name].    Client Statement of Needs  pt "to preserve my mental health.  Not be afraid to dream big.  Be able to continue to improve. Maintain compliance with my therapy, medications, holistic approaches.  I don't want to go back to where I was in the past"   Treatment Level  outpatient counseling    Symptoms  death of father 24-Mar-2021.  Husband dx w/ heart failure 11/2021. Grief and anxiety. History of at least one hypomanic, manic, or mixed mood episode.Marland Kitchen History of chronic or recurrent depression for which the client has taken antidepressant medication and had outpatient treatment,     Goals 1. Begin a healthy grieving process around the loss.   Objective Begin verbalizing feelings associated with the loss. Target Date: 2023-01-28 Frequency: Daily  Progress: 50 Modality: individual  Related Interventions Assist the client in identifying and expressing feelings connected with his/her loss.   2. continue to use calming skills to assist coping w/ stress and managing mood Objective Maintain positive self care, use of mindfulness and other grounding activities to maintain mood stability. Target Date: 2023-01-28 Frequency: Daily  Progress: 70 Modality: individual  Related Interventions  Assist the client in establishing a routine pattern of daily activities such as sleeping, eating, solitary and social activities, and exercise; use of relaxation and grounding  strategies routine and assist in assessing mood.   3. Develop healthy cognitive patterns and beliefs about self and the world that lead to alleviation and help prevent the relapse of mood episodes.   Objective Identify and replace thoughts and behaviors that trigger manic or depressive symptoms. Target Date: 2023-01-28 Frequency: Daily  Progress: 60 Modality: individual  Related Interventions utilize CBT strategies to assist in challenging negative thought patterns and beliefs and ACT strategies to assist in actions, decisions and interactions consistent w/values.   Client participated in Treatment plan development and provided verbal consent         [name], [facility]

## 2022-10-22 ENCOUNTER — Encounter: Payer: Self-pay | Admitting: Family Medicine

## 2022-10-22 ENCOUNTER — Ambulatory Visit: Payer: Self-pay | Admitting: Psychology

## 2022-10-22 ENCOUNTER — Other Ambulatory Visit: Payer: Self-pay

## 2022-10-22 ENCOUNTER — Other Ambulatory Visit: Payer: Self-pay | Admitting: Family Medicine

## 2022-10-22 DIAGNOSIS — J309 Allergic rhinitis, unspecified: Secondary | ICD-10-CM

## 2022-10-22 DIAGNOSIS — Z1231 Encounter for screening mammogram for malignant neoplasm of breast: Secondary | ICD-10-CM

## 2022-10-22 MED ORDER — LOSARTAN POTASSIUM 50 MG PO TABS: 50.0000 mg | ORAL_TABLET | Freq: Every day | ORAL | 2 refills | Status: DC

## 2022-10-22 MED ORDER — MONTELUKAST SODIUM 10 MG PO TABS: 10.0000 mg | ORAL_TABLET | Freq: Every day | ORAL | 2 refills | Status: DC

## 2022-10-22 MED ORDER — AZELASTINE HCL 0.1 % NA SOLN: 2.0000 | Freq: Two times a day (BID) | NASAL | 12 refills | Status: DC

## 2022-10-22 MED ORDER — ROSUVASTATIN CALCIUM 5 MG PO TABS: ORAL_TABLET | ORAL | 2 refills | Status: DC

## 2022-10-22 MED ORDER — FLUTICASONE PROPIONATE 50 MCG/ACT NA SUSP: 2.0000 | Freq: Every day | NASAL | 5 refills | Status: DC

## 2022-10-22 MED ORDER — METFORMIN HCL 1000 MG PO TABS: 1000.0000 mg | ORAL_TABLET | Freq: Two times a day (BID) | ORAL | 2 refills | Status: DC

## 2022-10-31 ENCOUNTER — Other Ambulatory Visit (HOSPITAL_COMMUNITY): Payer: Self-pay | Admitting: Psychiatry

## 2022-10-31 DIAGNOSIS — F419 Anxiety disorder, unspecified: Secondary | ICD-10-CM

## 2022-10-31 DIAGNOSIS — F3162 Bipolar disorder, current episode mixed, moderate: Secondary | ICD-10-CM

## 2022-11-01 NOTE — Progress Notes (Signed)
Referring Provider: [name], MD Primary Care Physician:  [name], MD Primary GI Physician: Dr. Abbey Chatters  No chief complaint on file.   HPI:   Christina [name] is a 47 y.o. female  with history of GERD and gastroparesis.  EGD and colonoscopy in 2019.  No polyps on colonoscopy, due for repeat in 2029.  EGD with gastritis, fundic gland polyps, gastric biopsies benign, small bowel biopsies negative for celiac.  She is presenting today for 1 year follow-up.  Last seen in our office 10/13/2021.  GERD well-controlled on omeprazole 40 mg daily.  No nausea, vomiting, or any other significant GI symptoms.  No alarm symptoms.  Advised to continue current medications, reinforced gastroparesis diet, follow-up in 1 year.  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 Oneida Alar: Torturous transverse and sigmoid colon, internal hemorrhoids.  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 mild chronic gastritis on path.    ESOPHAGOGASTRODUODENOSCOPY (EGD) WITH PROPOFOL N/A 10/26/2017   Dr. Oneida Alar: Gastritis, gastric polyps.  Biopsies benign.  Small bowel biopsies negative for celiac.  No H. pylori.   HERNIA REPAIR  12/2018   RADIOACTIVE SEED GUIDED EXCISIONAL BREAST BIOPSY Left 03/18/2022   Procedure: LEFT BREAST RADIOACTIVE SEED LOCALIZED EXCISIONAL BIOPSY;  Surgeon: Donnie Mesa, MD;  Location: [facility];  Service: General;  Laterality: Left;   REDUCTION MAMMAPLASTY     1994   TRIGGER FINGER RELEASE Bilateral 06/2015   TUBAL LIGATION Bilateral 03/06/2016   Procedure: BILATERAL TUBAL LIGATION;  Surgeon: Ena Dawley, MD;  Location: [place] [facility];  Service: Obstetrics;  Laterality: Bilateral;    Current Outpatient Medications  Medication Sig Dispense Refill   acetaminophen (TYLENOL) 500 MG tablet Take 1,000 mg every 8 (eight) hours as needed by mouth for mild pain.      ARIPiprazole (ABILIFY) 5 MG tablet Take 1 tablet (5 mg total) by mouth every evening. 90 tablet 0   azelastine (ASTELIN) 0.1 % nasal spray Place 2 sprays into both nostrils 2 (two) times daily. Use in each nostril as directed 30 mL 12   fluticasone (FLONASE) 50 MCG/ACT nasal spray Place 2 sprays into both nostrils daily. 48 mL 5   levocetirizine (XYZAL) 5 MG tablet Take 5 mg by mouth  every evening.     losartan (COZAAR) 50 MG tablet Take 1 tablet (50 mg total) by mouth daily. 90 tablet 2   metFORMIN (GLUCOPHAGE) 1000 MG tablet Take 1 tablet (1,000 mg total) by mouth 2 (two) times daily with a meal. 180 tablet 2   montelukast (SINGULAIR) 10 MG tablet Take 1 tablet (10 mg total) by mouth at bedtime. 90 tablet 2   omeprazole (PRILOSEC) 40 MG capsule Take 1 capsule by mouth daily. 90 capsule 2   OZEMPIC, 1 MG/DOSE, 2 MG/1.5ML SOPN Inject 1 mg into  the skin once a week.     OZEMPIC, 1 MG/DOSE, 4 MG/3ML SOPN Inject 1 mg into the muscle once a week.     rosuvastatin (CRESTOR) 5 MG tablet Take 1 tablet by mouth three times weekly. 36 tablet 2   No current facility-administered medications for this visit.    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   Peanut-containing drug products Anaphylaxis 02/08/2013   Augmentin [amoxicillin-pot clavulanate] Nausea And Vomiting 04/07/2013    Family History  Problem Relation Age of Onset   Diabetes Mother    Hypertension Mother    Fibromyalgia Mother    Stroke Mother    Diabetes Father    Hypertension Father    Asthma Father    Heart disease Father    Depression Father    Colon cancer Neg Hx    Breast cancer Neg Hx     Social History   Socioeconomic History   Marital status: Married    Spouse name: Not on file   Number of children: Not on file   Years of education: Not on file   Highest education level: Not on file  Occupational History   Not on file  Tobacco Use   Smoking status: Never   Smokeless tobacco: Never   Tobacco comments:    not everyday  Vaping Use   Vaping Use: Never used  Substance and Sexual Activity   Alcohol use: Yes    Alcohol/week: 0.0 standard drinks of alcohol    Comment: occ   Drug use: No   Sexual activity: Yes    Partners: Male    Birth control/protection: Surgical  Other Topics Concern   Not on file  Social History Narrative   Not on file   Social Determinants of Health   Financial Resource Strain: Not on file  Food Insecurity: Not on file  Transportation Needs: Not on file  Physical Activity: Not on file  Stress: Not on file  Social Connections: Not on file    Review of Systems: Gen: Denies fever, chills, anorexia. Denies fatigue, weakness, weight loss.  CV: Denies chest pain, palpitations, syncope, peripheral edema, and  claudication. Resp: Denies dyspnea at rest, cough, wheezing, coughing up blood, and pleurisy. GI: Denies vomiting blood, jaundice, and fecal incontinence.   Denies dysphagia or odynophagia. Derm: Denies rash, itching, dry skin Psych: Denies depression, anxiety, memory loss, confusion. No homicidal or suicidal ideation.  Heme: Denies bruising, bleeding, and enlarged lymph nodes.  Physical Exam: There were no vitals taken for this visit. General:   Alert and oriented. No distress noted. Pleasant and cooperative.  Head:  Normocephalic and atraumatic. Eyes:  Conjuctiva clear without scleral icterus. Heart:  S1, S2 present without murmurs appreciated. Lungs:  Clear to auscultation bilaterally. No wheezes, rales, or rhonchi. No distress.  Abdomen:  +BS, soft, non-tender and non-distended.  No rebound or guarding. No HSM or masses noted. Msk:  Symmetrical without gross deformities. Normal posture. Extremities:  Without edema. Neurologic:  Alert and  oriented x4 Psych:  Normal mood and affect.    Assessment:     Plan:  ***   Aliene Altes, PA-C [facility] 11/02/2022

## 2022-11-02 ENCOUNTER — Ambulatory Visit (INDEPENDENT_AMBULATORY_CARE_PROVIDER_SITE_OTHER): Payer: PRIVATE HEALTH INSURANCE | Admitting: Psychology

## 2022-11-02 ENCOUNTER — Ambulatory Visit (INDEPENDENT_AMBULATORY_CARE_PROVIDER_SITE_OTHER): Payer: PRIVATE HEALTH INSURANCE | Admitting: Gastroenterology

## 2022-11-02 ENCOUNTER — Encounter: Payer: Self-pay | Admitting: Gastroenterology

## 2022-11-02 VITALS — BP 115/79 | HR 89 | Temp 98.1°F | Ht 64.5 in | Wt 208.2 lb

## 2022-11-02 DIAGNOSIS — F319 Bipolar disorder, unspecified: Secondary | ICD-10-CM | POA: Diagnosis not present

## 2022-11-02 DIAGNOSIS — K219 Gastro-esophageal reflux disease without esophagitis: Secondary | ICD-10-CM

## 2022-11-02 DIAGNOSIS — K3184 Gastroparesis: Secondary | ICD-10-CM

## 2022-11-02 MED ORDER — OMEPRAZOLE 40 MG PO CPDR: 40.0000 mg | DELAYED_RELEASE_CAPSULE | Freq: Every day | ORAL | 3 refills | Status: DC

## 2022-11-02 MED ORDER — ONDANSETRON HCL 4 MG PO TABS: 4.0000 mg | ORAL_TABLET | Freq: Three times a day (TID) | ORAL | 1 refills | Status: AC | PRN

## 2022-11-02 NOTE — Patient Instructions (Signed)
Continue omeprazole 40 mg daily 30 minutes before breakfast.  I have sent a refill to your pharmacy.  Use Zofran as needed for occasional nausea.  I have sent a refill to your pharmacy.  Gastroparesis recommendations:  4-6 small meals daily Low fat diet Low fiber diet (avoid raw fruits and vegetables).  We will follow-up with you in 1 year or sooner if needed.   It was great to see you again today!  I am glad you are doing well overall!  Aliene Altes, PA-C [facility]

## 2022-11-02 NOTE — Progress Notes (Signed)
[place] Counselor/Therapist Progress Note  Patient ID: Christina [name], MRN: [id number],    Date: 11/02/2022  Time Spent: 11:00am-11:47am  Treatment Type: Individual Therapy  Pt is seen for a virtual video visit via caregility.   pt joins from her home, reporting privacy, and counselor from her home office.  Reported Symptoms:  Pt reports grief, tearful episode yesterday.  Pt reported on some worry/self doubt re: parenting decisions.    Mental Status Exam: Appearance:  Well Groomed     Behavior: Appropriate  Motor: Normal  Speech/Language:  Normal Rate  Affect: Appropriate  Mood: anxious  Thought process: normal  Thought content:   WNL  Sensory/Perceptual disturbances:   WNL  Orientation: oriented to person, place, time/date, and situation  Attention: Good  Concentration: Good  Memory: WNL  Fund of knowledge:  Good  Insight:   Good  Judgment:  Good  Impulse Control: Good   Risk Assessment: Danger to Self:  No Self-injurious Behavior: No Danger to Others: No Duty to Warn:no Physical Aggression / Violence:No   Subjective: Counselor assessed pt current functioning per pt report.  Processed w/pt mood, positives and stressors.  Reflected positive of being able to express and release emotion re: grief.  Explored balance of being engaged but not overwhelming.  Validate feelings re: parenting, parenting decisions and giving self permission to not "know what's right" and ability to flex as needed.  Pt affect wnl.  Pt reports feeling overwhelmed w/ grief yesterday and choosing to be vunerable and visit the grave and tearful "breakdown" there.  Pt recognizing that was ok for her to feel these emotions.  Pt reported on things she is getting done around the house, activities engaged in and planning for another grant.  Pt acknowledged to be mindful of not taking to much on.  Pt discussed worry of making right decisions- youngest to return to public school or dual enroll for  ABA and how much to "push" for trying other sports for daughter who is athletically inclined.  Pt acknowledged importance of self compassion for validating emotions.   Interventions: Cognitive Behavioral Therapy and ACT and supportive  Diagnosis:Bipolar I disorder  Plan: Pt to f/u w/ counseling in 1-2 week.   Pt to f/u w/ [name] as scheduled.   Treatment Plan 01/27/22   Client Abilities/Strengths  supports: family- partner and mom, friends  positives: self awareness, expressing feelings, advocating for self. Motivated, hardworking, intelligent, family centered   Client Treatment Preferences  Weekly to biweekly counseling and continue medication management [name].    Client Statement of Needs  pt "to preserve my mental health.  Not be afraid to dream big.  Be able to continue to improve. Maintain compliance with my therapy, medications, holistic approaches.  I don't want to go back to where I was in the past"   Treatment Level  outpatient counseling    Symptoms  death of father 03-23-2021.  Husband dx w/ heart failure 11/2021. Grief and anxiety. History of at least one hypomanic, manic, or mixed mood episode.Marland Kitchen History of chronic or recurrent depression for which the client has taken antidepressant medication and had outpatient treatment,     Goals 1. Begin a healthy grieving process around the loss.   Objective Begin verbalizing feelings associated with the loss. Target Date: 2023-01-28 Frequency: Daily  Progress: 50 Modality: individual  Related Interventions Assist the client in identifying and expressing feelings connected with his/her loss.   2. continue to use calming  skills to assist coping w/ stress and managing mood Objective Maintain positive self care, use of mindfulness and other grounding activities to maintain mood stability. Target Date: 2023-01-28 Frequency: Daily  Progress: 70 Modality: individual  Related Interventions Assist the client in establishing a  routine pattern of daily activities such as sleeping, eating, solitary and social activities, and exercise; use of relaxation and grounding strategies routine and assist in assessing mood.   3. Develop healthy cognitive patterns and beliefs about self and the world that lead to alleviation and help prevent the relapse of mood episodes.   Objective Identify and replace thoughts and behaviors that trigger manic or depressive symptoms. Target Date: 2023-01-28 Frequency: Daily  Progress: 60 Modality: individual  Related Interventions utilize CBT strategies to assist in challenging negative thought patterns and beliefs and ACT strategies to assist in actions, decisions and interactions consistent w/values.   Client participated in Treatment plan development and provided verbal consent         [name] [place], [facility]

## 2022-11-06 ENCOUNTER — Other Ambulatory Visit: Payer: Self-pay | Admitting: Family Medicine

## 2022-11-11 ENCOUNTER — Encounter: Payer: Self-pay | Admitting: Family Medicine

## 2022-11-11 ENCOUNTER — Ambulatory Visit: Payer: PRIVATE HEALTH INSURANCE | Admitting: Family Medicine

## 2022-11-11 VITALS — BP 127/86 | HR 82 | Ht 64.0 in | Wt 206.1 lb

## 2022-11-11 DIAGNOSIS — N3941 Urge incontinence: Secondary | ICD-10-CM

## 2022-11-11 DIAGNOSIS — E559 Vitamin D deficiency, unspecified: Secondary | ICD-10-CM | POA: Diagnosis not present

## 2022-11-11 DIAGNOSIS — E1165 Type 2 diabetes mellitus with hyperglycemia: Secondary | ICD-10-CM

## 2022-11-11 DIAGNOSIS — I10 Essential (primary) hypertension: Secondary | ICD-10-CM

## 2022-11-11 DIAGNOSIS — Z13220 Encounter for screening for lipoid disorders: Secondary | ICD-10-CM

## 2022-11-11 DIAGNOSIS — Z794 Long term (current) use of insulin: Secondary | ICD-10-CM

## 2022-11-11 DIAGNOSIS — F3162 Bipolar disorder, current episode mixed, moderate: Secondary | ICD-10-CM

## 2022-11-11 MED ORDER — TIRZEPATIDE 2.5 MG/0.5ML ~~LOC~~ SOAJ: 2.5000 mg | SUBCUTANEOUS | 0 refills | Status: DC

## 2022-11-11 MED ORDER — SYNJARDY XR 12.5-1000 MG PO TB24: ORAL_TABLET | ORAL | 3 refills | Status: DC

## 2022-11-11 NOTE — Patient Instructions (Addendum)
F/U in 3.5 months, call if you need me sooner New med for diabetes is combination of metformin and jardiance highest dose  I may need to add glipizide  We will send off for mounjaro  I will message you today my chart pls respond   cBC, cmp and EGFr, HBa1C, TSH, vit D and  Urine ACr today [facility] , [place]   Tylenol for sciatic pain  Do [facility] [employer]  It is important that you exercise regularly at least 30 minutes 5 times a week. If you develop chest pain, have severe difficulty breathing, or feel very tired, stop exercising immediately and seek medical attention

## 2022-11-11 NOTE — Progress Notes (Signed)
Christina [name]     MRN: [id number]      DOB: 08/11/1975   HPI Christina [name] is here for follow up and re-evaluation of chronic medical conditions, medication management and review of any available recent lab and radiology data.  Preventive health is updated, specifically  Cancer screening and Immunization.   Awaiting appt with new Endo and states blood sugar is ncontrolled and unable to access medication as no longer covered  The PT denies any adverse reactions to current medications since the last visit.  LBP radiatingto right thigh, no new incontinence, weakness or numbness  ROS Denies recent fever or chills. Denies sinus pressure, nasal congestion, ear pain or sore throat. Denies chest congestion, productive cough or wheezing. Denies chest pains, palpitations and leg swelling Denies abdominal pain, nausea, vomiting,diarrhea or constipation.   Denies dysuria, frequency, hesitancy and c/o  incontinence. Denies joint pain, swelling and limitation in mobility. Denies headaches, seizures, numbness, or tingling. Denies uncontrolled  depression, anxiety or insomnia. Denies skin break down or rash.   PE  BP 127/86 (BP Location: Right Arm, Patient Position: Sitting, Cuff Size: Large)   Pulse 82   Ht 5\' 4"  (1.626 m)   Wt 206 lb 1.9 oz (93.5 kg)   LMP 11/01/2022   SpO2 98%   BMI 35.38 kg/m   Patient alert and oriented and in no cardiopulmonary distress.  HEENT: No facial asymmetry, EOMI,     Neck supple .  Chest: Clear to auscultation bilaterally.  CVS: S1, S2 no murmurs, no S3.Regular rate.  ABD: Soft non tender.   Ext: No edema  MS: Adequate ROM spine, shoulders, hips and knees.  Skin: Intact, no ulcerations or rash noted.  Psych: Good eye contact, normal affect. Memory intact not anxious or depressed appearing.  CNS: CN 2-12 intact, power,  normal throughout.no focal deficits noted.   Assessment & Plan  Essential hypertension, benign Controlled, no change in  medication DASH diet and commitment to daily physical activity for a minimum of 30 minutes discussed and encouraged, as a part of hypertension management. The importance of attaining a healthy weight is also discussed.     11/11/2022   11:35 AM 11/02/2022    2:06 PM 08/20/2022    8:49 AM 05/20/2022    9:49 AM 05/05/2022    3:32 PM 05/05/2022    2:30 PM 04/28/2022    9:19 AM  BP/Weight  Systolic BP 127 115   118 133 409  Diastolic BP 86 79   80 87 82  Wt. (Lbs) 206.12 208.2    204.12 202.8  BMI 35.38 kg/m2 35.19 kg/m2    33.97 kg/m2 33.75 kg/m2     Information is confidential and restricted. Go to Review Flowsheets to unlock data.       Diabetes mellitus (HCC) Uncontrolled Christina [name] is reminded of the importance of commitment to daily physical activity for 30 minutes or more, as able and the need to limit carbohydrate intake to 30 to 60 grams per meal to help with blood sugar control.   The need to take medication as prescribed, test blood sugar as directed, and to call between visits if there is a concern that blood sugar is uncontrolled is also discussed.   Christina [name] is reminded of the importance of daily foot exam, annual eye examination, and good blood sugar, blood pressure and cholesterol control.     Latest Ref Rng & Units 11/12/2022    2:21 PM 05/20/2022    9:41  AM 03/13/2022    1:50 PM 12/23/2021   10:10 AM 08/19/2021    8:31 AM  Diabetic Labs  HbA1c 4.8 - 5.6 % 8.7    7.1  6.9   Micro/Creat Ratio 0 - 29 mg/g creat 40     25   Chol 100 - 199 mg/dL  161    096   HDL >04 mg/dL  78    57   Calc LDL 0 - 99 mg/dL  26    32   Triglycerides 0 - 149 mg/dL  71    88   Creatinine 0.57 - 1.00 mg/dL 5.40  9.81  1.91  4.78  0.78       11/11/2022   11:35 AM 11/02/2022    2:06 PM 08/20/2022    8:49 AM 05/20/2022    9:49 AM 05/05/2022    3:32 PM 05/05/2022    2:30 PM 04/28/2022    9:19 AM  BP/Weight  Systolic BP 127 115   118 133 295  Diastolic BP 86 79   80 87 82  Wt. (Lbs) 206.12  208.2    204.12 202.8  BMI 35.38 kg/m2 35.19 kg/m2    33.97 kg/m2 33.75 kg/m2     Information is confidential and restricted. Go to Review Flowsheets to unlock data.      Latest Ref Rng & Units 05/05/2022    2:20 PM 02/12/2022   12:00 AM  Foot/eye exam completion dates  Eye Exam No Retinopathy  No Retinopathy      Foot Form Completion  Done      This result is from an external source.        Morbid obesity (HCC)  Patient re-educated about  the importance of commitment to a  minimum of 150 minutes of exercise per week as able.  The importance of healthy food choices with portion control discussed, as well as eating regularly and within a 12 hour window most days. The need to choose "clean , green" food 50 to 75% of the time is discussed, as well as to make water the primary drink and set a goal of 64 ounces water daily.       11/11/2022   11:35 AM 11/02/2022    2:06 PM 08/20/2022    8:49 AM  Weight /BMI  Weight 206 lb 1.9 oz 208 lb 3.2 oz   Height  (1.626 m) 5' 4.5" (1.638 m)   BMI 35.38 kg/m2 35.19 kg/m2      Information is confidential and restricted. Go to Review Flowsheets to unlock data.      Bipolar 1 disorder, mixed, moderate (HCC) Stable , managed by Psych  Urinary incontinence Reviewed keegel's exercise and encouraged her to commit to ding these regularly

## 2022-11-13 ENCOUNTER — Other Ambulatory Visit: Payer: Self-pay

## 2022-11-13 ENCOUNTER — Encounter: Payer: Self-pay | Admitting: Family Medicine

## 2022-11-13 MED ORDER — SYNJARDY XR 12.5-1000 MG PO TB24: ORAL_TABLET | ORAL | 3 refills | Status: DC

## 2022-11-13 MED ORDER — TIRZEPATIDE 2.5 MG/0.5ML ~~LOC~~ SOAJ: 2.5000 mg | SUBCUTANEOUS | 0 refills | Status: DC

## 2022-11-14 LAB — CBC
Hematocrit: 36.9 % (ref 34.0–46.6)
Hemoglobin: 12 g/dL (ref 11.1–15.9)
MCH: 29.3 pg (ref 26.6–33.0)
MCHC: 32.5 g/dL (ref 31.5–35.7)
MCV: 90 fL (ref 79–97)
Platelets: 463 10*3/uL — ABNORMAL HIGH (ref 150–450)
RBC: 4.1 x10E6/uL (ref 3.77–5.28)
RDW: 13.1 % (ref 11.7–15.4)
WBC: 6.1 10*3/uL (ref 3.4–10.8)

## 2022-11-14 LAB — CMP14+EGFR
ALT: 14 IU/L (ref 0–32)
AST: 11 IU/L (ref 0–40)
Albumin/Globulin Ratio: 1.7 (ref 1.2–2.2)
Albumin: 4.5 g/dL (ref 3.9–4.9)
Alkaline Phosphatase: 72 IU/L (ref 44–121)
BUN/Creatinine Ratio: 11 (ref 9–23)
BUN: 8 mg/dL (ref 6–24)
Bilirubin Total: 0.2 mg/dL (ref 0.0–1.2)
CO2: 25 mmol/L (ref 20–29)
Calcium: 10 mg/dL (ref 8.7–10.2)
Chloride: 97 mmol/L (ref 96–106)
Creatinine, Ser: 0.74 mg/dL (ref 0.57–1.00)
Globulin, Total: 2.7 g/dL (ref 1.5–4.5)
Glucose: 178 mg/dL — ABNORMAL HIGH (ref 70–99)
Potassium: 4 mmol/L (ref 3.5–5.2)
Sodium: 139 mmol/L (ref 134–144)
Total Protein: 7.2 g/dL (ref 6.0–8.5)
eGFR: 101 mL/min/{1.73_m2} (ref >59)

## 2022-11-14 LAB — HEMOGLOBIN A1C
Est. average glucose Bld gHb Est-mCnc: 203 mg/dL
Hgb A1c MFr Bld: 8.7 % — ABNORMAL HIGH (ref 4.8–5.6)

## 2022-11-14 LAB — MICROALBUMIN / CREATININE URINE RATIO
Creatinine, Urine: 189 mg/dL
Microalb/Creat Ratio: 40 mg/g creat — ABNORMAL HIGH (ref 0–29)
Microalbumin, Urine: 74.7 ug/mL

## 2022-11-14 LAB — TSH: TSH: 0.9 u[IU]/mL (ref 0.450–4.500)

## 2022-11-14 LAB — VITAMIN D 25 HYDROXY (VIT D DEFICIENCY, FRACTURES): Vit D, 25-Hydroxy: 25.6 ng/mL — ABNORMAL LOW (ref 30.0–100.0)

## 2022-11-15 ENCOUNTER — Encounter: Payer: Self-pay | Admitting: Family Medicine

## 2022-11-15 DIAGNOSIS — R32 Unspecified urinary incontinence: Secondary | ICD-10-CM | POA: Insufficient documentation

## 2022-11-15 NOTE — Assessment & Plan Note (Signed)
  Patient re-educated about  the importance of commitment to a  minimum of 150 minutes of exercise per week as able.  The importance of healthy food choices with portion control discussed, as well as eating regularly and within a 12 hour window most days. The need to choose "clean , green" food 50 to 75% of the time is discussed, as well as to make water the primary drink and set a goal of 64 ounces water daily.       11/11/2022   11:35 AM 11/02/2022    2:06 PM 08/20/2022    8:49 AM  Weight /BMI  Weight 206 lb 1.9 oz 208 lb 3.2 oz   Height 5\' 4"  (1.626 m) 5' 4.5" (1.638 m)   BMI 35.38 kg/m2 35.19 kg/m2      Information is confidential and restricted. Go to Review Flowsheets to unlock data.

## 2022-11-15 NOTE — Assessment & Plan Note (Signed)
Stable , managed by Psych 

## 2022-11-15 NOTE — Assessment & Plan Note (Signed)
Reviewed keegel's exercise and encouraged her to commit to ding these regularly

## 2022-11-15 NOTE — Assessment & Plan Note (Signed)
Controlled, no change in medication DASH diet and commitment to daily physical activity for a minimum of 30 minutes discussed and encouraged, as a part of hypertension management. The importance of attaining a healthy weight is also discussed.     11/11/2022   11:35 AM 11/02/2022    2:06 PM 08/20/2022    8:49 AM 05/20/2022    9:49 AM 05/05/2022    3:32 PM 05/05/2022    2:30 PM 04/28/2022    9:19 AM  BP/Weight  Systolic BP 127 115   118 133 056  Diastolic BP 86 79   80 87 82  Wt. (Lbs) 206.12 208.2    204.12 202.8  BMI 35.38 kg/m2 35.19 kg/m2    33.97 kg/m2 33.75 kg/m2     Information is confidential and restricted. Go to Review Flowsheets to unlock data.

## 2022-11-15 NOTE — Assessment & Plan Note (Addendum)
Uncontrolled Ms. Ricciardi is reminded of the importance of commitment to daily physical activity for 30 minutes or more, as able and the need to limit carbohydrate intake to 30 to 60 grams per meal to help with blood sugar control.   The need to take medication as prescribed, test blood sugar as directed, and to call between visits if there is a concern that blood sugar is uncontrolled is also discussed.   Ms. Thompsen is reminded of the importance of daily foot exam, annual eye examination, and good blood sugar, blood pressure and cholesterol control.     Latest Ref Rng & Units 11/12/2022    2:21 PM 05/20/2022    9:41 AM 03/13/2022    1:50 PM 12/23/2021   10:10 AM 08/19/2021    8:31 AM  Diabetic Labs  HbA1c 4.8 - 5.6 % 8.7    7.1  6.9   Micro/Creat Ratio 0 - 29 mg/g creat 40     25   Chol 100 - 199 mg/dL  283    662   HDL >94 mg/dL  78    57   Calc LDL 0 - 99 mg/dL  26    32   Triglycerides 0 - 149 mg/dL  71    88   Creatinine 0.57 - 1.00 mg/dL 7.65  4.65  0.35  4.65  0.78       11/11/2022   11:35 AM 11/02/2022    2:06 PM 08/20/2022    8:49 AM 05/20/2022    9:49 AM 05/05/2022    3:32 PM 05/05/2022    2:30 PM 04/28/2022    9:19 AM  BP/Weight  Systolic BP 127 115   118 133 681  Diastolic BP 86 79   80 87 82  Wt. (Lbs) 206.12 208.2    204.12 202.8  BMI 35.38 kg/m2 35.19 kg/m2    33.97 kg/m2 33.75 kg/m2     Information is confidential and restricted. Go to Review Flowsheets to unlock data.      Latest Ref Rng & Units 05/05/2022    2:20 PM 02/12/2022   12:00 AM  Foot/eye exam completion dates  Eye Exam No Retinopathy  No Retinopathy      Foot Form Completion  Done      This result is from an external source.

## 2022-11-16 ENCOUNTER — Ambulatory Visit: Payer: PRIVATE HEALTH INSURANCE | Admitting: Psychology

## 2022-11-17 ENCOUNTER — Ambulatory Visit: Payer: PRIVATE HEALTH INSURANCE | Admitting: Psychology

## 2022-11-19 ENCOUNTER — Telehealth (HOSPITAL_COMMUNITY): Payer: BC Managed Care – PPO | Admitting: Psychiatry

## 2022-11-19 ENCOUNTER — Telehealth (HOSPITAL_COMMUNITY): Payer: PRIVATE HEALTH INSURANCE | Admitting: Psychiatry

## 2022-11-19 ENCOUNTER — Encounter (HOSPITAL_COMMUNITY): Payer: Self-pay | Admitting: Psychiatry

## 2022-11-19 DIAGNOSIS — F3162 Bipolar disorder, current episode mixed, moderate: Secondary | ICD-10-CM | POA: Diagnosis not present

## 2022-11-19 DIAGNOSIS — F419 Anxiety disorder, unspecified: Secondary | ICD-10-CM | POA: Diagnosis not present

## 2022-11-19 MED ORDER — ARIPIPRAZOLE 5 MG PO TABS: 5.0000 mg | ORAL_TABLET | Freq: Every evening | ORAL | 0 refills | Status: DC

## 2022-11-19 NOTE — Progress Notes (Signed)
[facility] MD Virtual Progress Note   Patient Location: In Car Provider Location: Office  I connect with patient by video and verified that I am speaking with correct person by using two identifiers. I discussed the limitations of evaluation and management by telemedicine and the availability of in person appointments. I also discussed with the patient that there may be a patient responsible charge related to this service. The patient expressed understanding and agreed to proceed.  Christina [name] [id number] 47 y.o.  11/19/2022 10:28 AM  History of Present Illness:  Patient is evaluated by video session.  She admitted weight gain and increased blood sugar because her insurance does not approve Ozempic.  Now her primary care doctor switching to [facility] and she is hoping next blood work will show better results.  She denies any hallucination, paranoia, suicidal thoughts.  She is sleeping okay.  Her job is going well but sometimes she gets stressed about her husband who now having internal bleeding and seeing multiple doctors.  Patient husband has CHF and uses oxygen.  Her kids are doing okay.  She has a 18 year old, 63-year-old and 57-year-old.  She has no tremors, shakes or any EPS.  She denies any major panic attack but admitted she went to [place] at her father's graveyard and she has grief attack.  She denies drinking or using any illegal substances.  She does not want to change the medication because she feels her mania, impulsive behavior and anxiety is stable on Abilify.  She is not involved in any recent manic episode.  Past Psychiatric History: H/O depression, anxiety, mania and psychosis.  Admitted in 1997 at [facility] due to suicidal gestures.  Seen in this office since 2006. Tried Prozac and Wellbutrin.     Outpatient Encounter Medications as of 11/19/2022  Medication Sig   acetaminophen (TYLENOL) 500 MG tablet Take 1,000 mg every 8 (eight) hours as needed by mouth for mild pain.     ARIPiprazole (ABILIFY) 5 MG tablet Take 1 tablet (5 mg total) by mouth every evening.   azelastine (ASTELIN) 0.1 % nasal spray Place 2 sprays into both nostrils 2 (two) times daily. Use in each nostril as directed   Empagliflozin-metFORMIN HCl ER (SYNJARDY XR) 12.12-998 MG TB24 Take two tablets by mouth once daily for diabetes   fluticasone (FLONASE) 50 MCG/ACT nasal spray Place 2 sprays into both nostrils daily.   levocetirizine (XYZAL) 5 MG tablet Take 5 mg by mouth every evening.   losartan (COZAAR) 50 MG tablet Take 1 tablet (50 mg total) by mouth daily.   montelukast (SINGULAIR) 10 MG tablet Take 1 tablet (10 mg total) by mouth at bedtime.   omeprazole (PRILOSEC) 40 MG capsule Take 1 capsule (40 mg total) by mouth daily.   ondansetron (ZOFRAN) 4 MG tablet Take 1 tablet (4 mg total) by mouth every 8 (eight) hours as needed for nausea or vomiting.   rosuvastatin (CRESTOR) 5 MG tablet Take 1 tablet by mouth three times weekly.   tirzepatide [facility]) 2.5 MG/0.5ML Pen Inject 2.5 mg into the skin once a week.   No facility-administered encounter medications on file as of 11/19/2022.    Recent Results (from the past 2160 hour(s))  CBC     Status: Abnormal   Collection Time: 11/12/22  2:21 PM  Result Value Ref Range   WBC 6.1 3.4 - 10.8 x10E3/uL   RBC 4.10 3.77 - 5.28 x10E6/uL   Hemoglobin 12.0 11.1 - 15.9 g/dL   Hematocrit 59.5 63.8 -  46.6 %   MCV 90 79 - 97 fL   MCH 29.3 26.6 - 33.0 pg   MCHC 32.5 31.5 - 35.7 g/dL   R11.9W 13.1 14.7 - 82.9 %   Platelets 463 (H) 150 - 450 x10E3/uL  CMP14+EGFR     Status: Abnormal   Collection Time: 11/12/22  2:21 PM  Result Value Ref Range   Glucose 178 (H) 70 - 99 mg/dL   BUN 8 6 - 24 mg/dL   Creatinine, Ser 5.62 0.57 - 1.00 mg/dL   eGFR 130 >86 VH/QIO/9.62   BUN/Creatinine Ratio 11 9 - 23   Sodium 139 134 - 144 mmol/L   Potassium 4.0 3.5 - 5.2 mmol/L   Chloride 97 96 - 106 mmol/L   CO2 25 20 - 29 mmol/L   Calcium 10.0 8.7 - 10.2 mg/dL    Total Protein 7.2 6.0 - 8.5 g/dL   Albumin 4.5 3.9 - 4.9 g/dL   Globulin, Total 2.7 1.5 - 4.5 g/dL   Albumin/Globulin Ratio 1.7 1.2 - 2.2   Bilirubin Total 0.2 0.0 - 1.2 mg/dL   Alkaline Phosphatase 72 44 - 121 IU/L   AST 11 0 - 40 IU/L   ALT 14 0 - 32 IU/L  Hemoglobin A1c     Status: Abnormal   Collection Time: 11/12/22  2:21 PM  Result Value Ref Range   Hgb A1c MFr Bld 8.7 (H) 4.8 - 5.6 %    Comment:          Prediabetes: 5.7 - 6.4          Diabetes: >6.4          Glycemic control for adults with diabetes: <7.0    Est. average glucose Bld gHb Est-mCnc 203 mg/dL  TSH     Status: None   Collection Time: 11/12/22  2:21 PM  Result Value Ref Range   TSH 0.900 0.450 - 4.500 uIU/mL  VITAMIN D 25 Hydroxy (Vit-D Deficiency, Fractures)     Status: Abnormal   Collection Time: 11/12/22  2:21 PM  Result Value Ref Range   Vit D, 25-Hydroxy 25.6 (L) 30.0 - 100.0 ng/mL    Comment: Vitamin D deficiency has been defined by the Institute of Medicine and an Endocrine Society practice guideline as a level of serum 25-OH vitamin D less than 20 ng/mL (1,2). The Endocrine Society went on to further define vitamin D insufficiency as a level between 21 and 29 ng/mL (2). 1. IOM (Institute of Medicine). 2010. Dietary reference    intakes for calcium and D. [place]: The    [employer]. 2. Holick MF, Binkley Otis, Bischoff-Ferrari HA, et al.    Evaluation, treatment, and prevention of vitamin D    deficiency: an Endocrine Society clinical practice    guideline. JCEM. 2011 Jul; 96(7):1911-30.   Microalbumin / creatinine urine ratio     Status: Abnormal   Collection Time: 11/12/22  2:21 PM  Result Value Ref Range   Creatinine, Urine 189.0 Not Estab. mg/dL   Microalbumin, Urine 95.2 Not Estab. ug/mL   Microalb/Creat Ratio 40 (H) 0 - 29 mg/g creat    Comment:                        Normal:                0 -  29  Moderately increased: 30 - 300                         Severely increased:       >300      Psychiatric Specialty Exam: Physical Exam  Review of Systems  Weight 208 lb (94.3 kg), last menstrual period 11/01/2022, unknown if currently breastfeeding.There is no height or weight on file to calculate BMI.  General Appearance: Casual  Eye Contact:  Good  Speech:  Clear and Coherent and Normal Rate  Volume:  Normal  Mood:  Anxious  Affect:  Congruent  Thought Process:  Goal Directed  Orientation:  Full (Time, Place, and Person)  Thought Content:  Logical  Suicidal Thoughts:  No  Homicidal Thoughts:  No  Memory:  Immediate;   Good Recent;   Good Remote;   Good  Judgement:  Good  Insight:  Good  Psychomotor Activity:  Normal  Concentration:  Concentration: Good and Attention Span: Good  Recall:  Good  Fund of Knowledge:  Good  Language:  Good  Akathisia:  No  Handed:  Right  AIMS (if indicated):     Assets:  Communication Skills Desire for Improvement Housing Social Support Talents/Skills Transportation  ADL's:  Intact  Cognition:  WNL  Sleep:  ok     Assessment/Plan: Anxiety - Plan: ARIPiprazole (ABILIFY) 5 MG tablet  Bipolar 1 disorder, mixed, moderate - Plan: ARIPiprazole (ABILIFY) 5 MG tablet  I reviewed blood work results.  Her hemoglobin A1c increased and her weight increased which she believes due to not getting Ozempic as insurance refused.  Now she is taking Mounjaro.  I offered trying a different medication as explained Abilify can cause metabolic syndrome but patient told that she has been doing very well and her symptoms are stable.  She is hoping the new medication will help her sugar and weight under control.  Will keep Abilify 5 mg daily.  Recommended to call us back if she has any question or any concern.  Follow-up in 3 months.   Follow Up Instructions:     I discussed the assessment and treatment plan with the patient. The patient was provided an opportunity to ask questions and all were answered. The  patient agreed with the plan and demonstrated an understanding of the instructions.   The patient was advised to call back or seek an in-person evaluation if the symptoms worsen or if the condition fails to improve as anticipated.    Collaboration of Care: Other provider involved in patient's care AEB notes are available in epic to review.  Patient/Guardian was advised Release of Information must be obtained prior to any record release in order to collaborate their care with an outside provider. Patient/Guardian was advised if they have not already done so to contact the registration department to sign all necessary forms in order for [place] to release information regarding their care.   Consent: Patient/Guardian gives verbal consent for treatment and assignment of benefits for services provided during this visit. Patient/Guardian expressed understanding and agreed to proceed.     I provided 13 minutes of non face to face time during this encounter.  Note: This document was prepared by [employer] voice dictation technology and any errors that results from this process are unintentional.    Cleotis Nipper, MD 11/19/2022

## 2022-11-20 ENCOUNTER — Encounter: Payer: Self-pay | Admitting: Family Medicine

## 2022-11-24 ENCOUNTER — Ambulatory Visit (INDEPENDENT_AMBULATORY_CARE_PROVIDER_SITE_OTHER): Payer: PRIVATE HEALTH INSURANCE | Admitting: Psychology

## 2022-11-24 DIAGNOSIS — F319 Bipolar disorder, unspecified: Secondary | ICD-10-CM | POA: Diagnosis not present

## 2022-11-24 NOTE — Progress Notes (Signed)
[facility] Counselor/Therapist Progress Note  Patient ID: Christina [name], MRN: [id number],    Date: 11/24/2022  Time Spent: 12:00pm-12:48am  Treatment Type: Individual Therapy  Pt is seen for a virtual video visit via caregility.   pt joins from her parked car, reporting privacy, and counselor from her home office.  Reported Symptoms:  Pt reports increased self reflection and awareness of grief and expressing emotions.    Mental Status Exam: Appearance:  Well Groomed     Behavior: Appropriate  Motor: Normal  Speech/Language:  Normal Rate  Affect: Appropriate  Mood: anxious  Thought process: normal  Thought content:   WNL  Sensory/Perceptual disturbances:   WNL  Orientation: oriented to person, place, time/date, and situation  Attention: Good  Concentration: Good  Memory: WNL  Fund of knowledge:  Good  Insight:   Good  Judgment:  Good  Impulse Control: Good   Risk Assessment: Danger to Self:  No Self-injurious Behavior: No Danger to Others: No Duty to Warn:no Physical Aggression / Violence:No   Subjective: Counselor assessed pt current functioning per pt report.  Processed w/pt mood, positive and stressors.  Explored w/ pt self reflection and themes emerging.  Discussed decisions needing to for son w/ education.  Reflected positives of support gained from expressing emotions/grief w/ supports.  Pt affect wnl.  Pt reports awareness on increased reflection re: parenting child w/ autism, husband's health, impact on self and family and evaluating what's important.  Pt reports that having to decide next steps for son and his education return to public school system or not.  Pt discussed connecting w/ 2 friends who are also impacted by recent deaths in their families and expressing emotions and grief and supporting each other.    Interventions: Cognitive Behavioral Therapy and ACT and supportive  Diagnosis:Bipolar I disorder  Plan: Pt to f/u w/ counseling in 1-2  week.   Pt to f/u w/ Dr. Lolly Mustache as scheduled.   Treatment Plan 01/27/22   Client Abilities/Strengths  supports: family- partner and mom, friends  positives: self awareness, expressing feelings, advocating for self. Motivated, hardworking, intelligent, family centered   Client Treatment Preferences  Weekly to biweekly counseling and continue medication management Dr. Lolly Mustache.    Client Statement of Needs  pt "to preserve my mental health.  Not be afraid to dream big.  Be able to continue to improve. Maintain compliance with my therapy, medications, holistic approaches.  I don't want to go back to where I was in the past"   Treatment Level  outpatient counseling    Symptoms  death of father 03/19/21.  Husband dx w/ heart failure 11/2021. Grief and anxiety. History of at least one hypomanic, manic, or mixed mood episode.Marland Kitchen History of chronic or recurrent depression for which the client has taken antidepressant medication and had outpatient treatment,     Goals 1. Begin a healthy grieving process around the loss.   Objective Begin verbalizing feelings associated with the loss. Target Date: 2023-01-28 Frequency: Daily  Progress: 50 Modality: individual  Related Interventions Assist the client in identifying and expressing feelings connected with his/her loss.   2. continue to use calming skills to assist coping w/ stress and managing mood Objective Maintain positive self care, use of mindfulness and other grounding activities to maintain mood stability. Target Date: 2023-01-28 Frequency: Daily  Progress: 70 Modality: individual  Related Interventions Assist the client in establishing a routine pattern of daily activities such as sleeping, eating, solitary and social  activities, and exercise; use of relaxation and grounding strategies routine and assist in assessing mood.   3. Develop healthy cognitive patterns and beliefs about self and the world that lead to alleviation and help prevent  the relapse of mood episodes.   Objective Identify and replace thoughts and behaviors that trigger manic or depressive symptoms. Target Date: 2023-01-28 Frequency: Daily  Progress: 60 Modality: individual  Related Interventions utilize CBT strategies to assist in challenging negative thought patterns and beliefs and ACT strategies to assist in actions, decisions and interactions consistent w/values.   Client participated in Treatment plan development and provided verbal consent     Forde Radon, [facility]

## 2022-11-30 ENCOUNTER — Encounter: Payer: Self-pay | Admitting: Family Medicine

## 2022-12-02 ENCOUNTER — Ambulatory Visit: Payer: PRIVATE HEALTH INSURANCE | Admitting: Psychology

## 2022-12-02 ENCOUNTER — Ambulatory Visit: Payer: BC Managed Care – PPO | Admitting: Family Medicine

## 2022-12-07 ENCOUNTER — Encounter: Payer: Self-pay | Admitting: Family Medicine

## 2022-12-07 MED ORDER — TIRZEPATIDE 5 MG/0.5ML ~~LOC~~ SOAJ: 5.0000 mg | SUBCUTANEOUS | 2 refills | Status: DC

## 2022-12-09 ENCOUNTER — Ambulatory Visit: Payer: PRIVATE HEALTH INSURANCE | Admitting: Psychology

## 2022-12-09 DIAGNOSIS — F319 Bipolar disorder, unspecified: Secondary | ICD-10-CM

## 2022-12-09 NOTE — Progress Notes (Signed)
[facility] Counselor/Therapist Progress Note  Patient ID: Christina [name], MRN: [id number],    Date: 12/09/2022  Time Spent: 1:30pm-2:14pm  Treatment Type: Individual Therapy  Pt is seen for a virtual video visit via caregility.   pt joins from her parked car, reporting privacy, and counselor from her home office.  Reported Symptoms:  Pt reports worry/anxiety re: husband's health  Mental Status Exam: Appearance:  Well Groomed     Behavior: Appropriate  Motor: Normal  Speech/Language:  Normal Rate  Affect: Appropriate  Mood: anxious  Thought process: normal  Thought content:   WNL  Sensory/Perceptual disturbances:   WNL  Orientation: oriented to person, place, time/date, and situation  Attention: Good  Concentration: Good  Memory: WNL  Fund of knowledge:  Good  Insight:   Good  Judgment:  Good  Impulse Control: Good   Risk Assessment: Danger to Self:  No Self-injurious Behavior: No Danger to Others: No Duty to Warn:no Physical Aggression / Violence:No   Subjective: Counselor assessed pt current functioning per pt report.  Processed w/pt emotions and stressors. Explored coping and self care.  Discussed keeping balance- moderation of activities/commitments.  .  Reflected positives of decisions making planning w/ awareness of husband's limitations and not taking on more.  Pt affect wnl.  Pt reports anxiety and worry for husband and his health- further dx and impacting on his functioning.  Pt reports keeping realistic about planning for kids and knowing his limitations- enrolling son back into public school and utilizing resources and supports available. Pt good insight into go to is keeping self busy to not feel/think about things and needing to allow for space to do so.      Interventions: Cognitive Behavioral Therapy and ACT and supportive  Diagnosis:Bipolar I disorder (HCC)  Plan: Pt to f/u w/ counseling in 1-2 week.   Pt to f/u w/ Dr. Lolly Mustache as scheduled.    Treatment Plan 01/27/22   Client Abilities/Strengths  supports: family- partner and mom, friends  positives: self awareness, expressing feelings, advocating for self. Motivated, hardworking, intelligent, family centered   Client Treatment Preferences  Weekly to biweekly counseling and continue medication management Dr. Lolly Mustache.    Client Statement of Needs  pt "to preserve my mental health.  Not be afraid to dream big.  Be able to continue to improve. Maintain compliance with my therapy, medications, holistic approaches.  I don't want to go back to where I was in the past"   Treatment Level  outpatient counseling    Symptoms  death of father 03/27/21.  Husband dx w/ heart failure 11/2021. Grief and anxiety. History of at least one hypomanic, manic, or mixed mood episode.Marland Kitchen History of chronic or recurrent depression for which the client has taken antidepressant medication and had outpatient treatment,     Goals 1. Begin a healthy grieving process around the loss.   Objective Begin verbalizing feelings associated with the loss. Target Date: 2023-01-28 Frequency: Daily  Progress: 50 Modality: individual  Related Interventions Assist the client in identifying and expressing feelings connected with his/her loss.   2. continue to use calming skills to assist coping w/ stress and managing mood Objective Maintain positive self care, use of mindfulness and other grounding activities to maintain mood stability. Target Date: 2023-01-28 Frequency: Daily  Progress: 70 Modality: individual  Related Interventions Assist the client in establishing a routine pattern of daily activities such as sleeping, eating, solitary and social activities, and exercise; use of relaxation and  grounding strategies routine and assist in assessing mood.   3. Develop healthy cognitive patterns and beliefs about self and the world that lead to alleviation and help prevent the relapse of mood episodes.    Objective Identify and replace thoughts and behaviors that trigger manic or depressive symptoms. Target Date: 2023-01-28 Frequency: Daily  Progress: 60 Modality: individual  Related Interventions utilize CBT strategies to assist in challenging negative thought patterns and beliefs and ACT strategies to assist in actions, decisions and interactions consistent w/values.   Client participated in Treatment plan development and provided verbal consent        Forde Radon, [facility]

## 2022-12-15 ENCOUNTER — Encounter: Payer: Self-pay | Admitting: Family Medicine

## 2022-12-16 ENCOUNTER — Other Ambulatory Visit: Payer: Self-pay

## 2022-12-16 MED ORDER — ROSUVASTATIN CALCIUM 5 MG PO TABS: ORAL_TABLET | ORAL | 0 refills | Status: DC

## 2022-12-16 MED ORDER — MONTELUKAST SODIUM 10 MG PO TABS: 10.0000 mg | ORAL_TABLET | Freq: Every day | ORAL | 2 refills | Status: AC

## 2022-12-16 MED ORDER — SYNJARDY XR 12.5-1000 MG PO TB24: ORAL_TABLET | ORAL | 3 refills | Status: AC

## 2022-12-16 MED ORDER — TIRZEPATIDE 5 MG/0.5ML ~~LOC~~ SOAJ: 5.0000 mg | SUBCUTANEOUS | 2 refills | Status: DC

## 2022-12-16 MED ORDER — LOSARTAN POTASSIUM 50 MG PO TABS: 50.0000 mg | ORAL_TABLET | Freq: Every day | ORAL | 2 refills | Status: DC

## 2022-12-17 ENCOUNTER — Ambulatory Visit: Payer: PRIVATE HEALTH INSURANCE | Admitting: Psychology

## 2022-12-17 DIAGNOSIS — F319 Bipolar disorder, unspecified: Secondary | ICD-10-CM | POA: Diagnosis not present

## 2022-12-17 NOTE — Progress Notes (Signed)
[facility] Counselor/Therapist Progress Note  Patient ID: FATMAH GALAS, MRN: [id number],    Date: 12/17/2022  Time Spent: 12:00pm-12:43pm  Treatment Type: Individual Therapy  Pt is seen for a virtual video visit via caregility.   pt joins from her parked car, reporting privacy, and counselor from her home office.  Reported Symptoms:  Pt reports some grief moments.  Pt reports decisions to not take on extra projects right now.  Mental Status Exam: Appearance:  Well Groomed     Behavior: Appropriate  Motor: Normal  Speech/Language:  Normal Rate  Affect: Appropriate  Mood: anxious and sad  Thought process: normal  Thought content:   WNL  Sensory/Perceptual disturbances:   WNL  Orientation: oriented to person, place, time/date, and situation  Attention: Good  Concentration: Good  Memory: WNL  Fund of knowledge:  Good  Insight:   Good  Judgment:  Good  Impulse Control: Good   Risk Assessment: Danger to Self:  No Self-injurious Behavior: No Danger to Others: No Duty to Warn:no Physical Aggression / Violence:No   Subjective: Counselor assessed pt current functioning per pt report.  Processed w/pt emotions and grief reactions.  Encouraged ways of allowing to express some feelings and ground self.   Discussed keeping balance and reflected decision in congruence w/ this.  Pt affect wnl.  Pt reports some anxiety and some sadness w/ grief reactions and discussed some recent triggers.  Pt also acknowledged how pushing some feelings down and avoidance of some triggers.  Pt discussed some steps w/ plans and decisions re: self and for kids.  Pt reports she has decided to not take on a class for summer.       Interventions: Cognitive Behavioral Therapy and ACT and supportive  Diagnosis:Bipolar I disorder (HCC)  Plan: Pt to f/u w/ counseling in 1-2 week.   Pt to f/u w/ Dr. Lolly Mustache as scheduled.   Treatment Plan 01/27/22   Client Abilities/Strengths  supports: family-  partner and mom, friends  positives: self awareness, expressing feelings, advocating for self. Motivated, hardworking, intelligent, family centered   Client Treatment Preferences  Weekly to biweekly counseling and continue medication management Dr. Lolly Mustache.    Client Statement of Needs  pt "to preserve my mental health.  Not be afraid to dream big.  Be able to continue to improve. Maintain compliance with my therapy, medications, holistic approaches.  I don't want to go back to where I was in the past"   Treatment Level  outpatient counseling    Symptoms  death of father 04-16-21.  Husband dx w/ heart failure 11/2021. Grief and anxiety. History of at least one hypomanic, manic, or mixed mood episode.Marland Kitchen History of chronic or recurrent depression for which the client has taken antidepressant medication and had outpatient treatment,     Goals 1. Begin a healthy grieving process around the loss.   Objective Begin verbalizing feelings associated with the loss. Target Date: 2023-01-28 Frequency: Daily  Progress: 50 Modality: individual  Related Interventions Assist the client in identifying and expressing feelings connected with his/her loss.   2. continue to use calming skills to assist coping w/ stress and managing mood Objective Maintain positive self care, use of mindfulness and other grounding activities to maintain mood stability. Target Date: 2023-01-28 Frequency: Daily  Progress: 70 Modality: individual  Related Interventions Assist the client in establishing a routine pattern of daily activities such as sleeping, eating, solitary and social activities, and exercise; use of relaxation and grounding  strategies routine and assist in assessing mood.   3. Develop healthy cognitive patterns and beliefs about self and the world that lead to alleviation and help prevent the relapse of mood episodes.   Objective Identify and replace thoughts and behaviors that trigger manic or depressive  symptoms. Target Date: 2023-01-28 Frequency: Daily  Progress: 60 Modality: individual  Related Interventions utilize CBT strategies to assist in challenging negative thought patterns and beliefs and ACT strategies to assist in actions, decisions and interactions consistent w/values.   Client participated in Treatment plan development and provided verbal consent        Forde Radon [facility]               [place], [facility]

## 2022-12-18 LAB — HM DIABETES EYE EXAM

## 2022-12-31 ENCOUNTER — Ambulatory Visit: Payer: PRIVATE HEALTH INSURANCE | Admitting: Psychology

## 2023-01-05 ENCOUNTER — Ambulatory Visit: Payer: PRIVATE HEALTH INSURANCE | Admitting: Psychology

## 2023-01-06 ENCOUNTER — Telehealth: Payer: PRIVATE HEALTH INSURANCE | Admitting: Family Medicine

## 2023-01-06 ENCOUNTER — Encounter: Payer: Self-pay | Admitting: Family Medicine

## 2023-01-06 ENCOUNTER — Ambulatory Visit: Payer: PRIVATE HEALTH INSURANCE | Admitting: Psychology

## 2023-01-06 VITALS — Ht 64.0 in | Wt 188.0 lb

## 2023-01-06 DIAGNOSIS — A084 Viral intestinal infection, unspecified: Secondary | ICD-10-CM | POA: Diagnosis not present

## 2023-01-06 MED ORDER — LOPERAMIDE HCL 2 MG PO TABS: 2.0000 mg | ORAL_TABLET | Freq: Four times a day (QID) | ORAL | 0 refills | Status: DC | PRN

## 2023-01-06 NOTE — Progress Notes (Signed)
Virtual Visit via Video Note  I connected with Christina [name] on 01/06/23 at 10:00 AM EDT by a video enabled telemedicine application and verified that I am speaking with the correct person using two identifiers.  Patient Location: Other:  Museum/gallery conservator Location: Home Office  I discussed the limitations, risks, security, and privacy concerns of performing an evaluation and management service by video and the availability of in person appointments. I also discussed with the patient that there may be a patient responsible charge related to this service. The patient expressed understanding and agreed to proceed.  Subjective: PCP: Kerri Perches, MD  Chief Complaint  Patient presents with   Emesis    Patient complains of vomiting, diarrhea, body aches and headache, fever starting yesterday morning.    Abdominal Pain This is a new problem. The current episode started yesterday. The onset quality is sudden. Patient reports abdominal pain occurs constantly and has been gradually improving. The pain is located in the suprapubic region. The pain is at a severity of 5/10. The quality of the pain is tearing. The abdominal pain does not radiate. Associated symptoms include diarrhea, low grade fever and nausea. Pertinent negatives include no hematochezia, hematuria or vomiting. The pain is aggravated by movement. The pain is relieved by nothing. She has tried acetaminophen for the symptoms. The treatment provided moderate relief.        ROS: Per HPI  Current Outpatient Medications:    acetaminophen (TYLENOL) 500 MG tablet, Take 1,000 mg every 8 (eight) hours as needed by mouth for mild pain. , Disp: , Rfl:    ARIPiprazole (ABILIFY) 5 MG tablet, Take 1 tablet (5 mg total) by mouth every evening., Disp: 90 tablet, Rfl: 0   azelastine (ASTELIN) 0.1 % nasal spray, Place 2 sprays into both nostrils 2 (two) times daily. Use in each nostril as directed, Disp: 30 mL, Rfl: 12    Empagliflozin-metFORMIN HCl ER (SYNJARDY XR) 12.12-998 MG TB24, Take two tablets by mouth once daily for diabetes, Disp: 60 tablet, Rfl: 3   fluticasone (FLONASE) 50 MCG/ACT nasal spray, Place 2 sprays into both nostrils daily., Disp: 48 mL, Rfl: 5   levocetirizine (XYZAL) 5 MG tablet, Take 5 mg by mouth every evening., Disp: , Rfl:    losartan (COZAAR) 50 MG tablet, Take 1 tablet (50 mg total) by mouth daily., Disp: 90 tablet, Rfl: 2   montelukast (SINGULAIR) 10 MG tablet, Take 1 tablet (10 mg total) by mouth at bedtime., Disp: 90 tablet, Rfl: 2   omeprazole (PRILOSEC) 40 MG capsule, Take 1 capsule (40 mg total) by mouth daily., Disp: 90 capsule, Rfl: 3   ondansetron (ZOFRAN) 4 MG tablet, Take 1 tablet (4 mg total) by mouth every 8 (eight) hours as needed for nausea or vomiting., Disp: 30 tablet, Rfl: 1   rosuvastatin (CRESTOR) 5 MG tablet, Take 1 tablet by mouth three times weekly., Disp: 36 tablet, Rfl: 0   tirzepatide (MOUNJARO) 5 MG/0.5ML Pen, Inject 5 mg into the skin once a week., Disp: 2 mL, Rfl: 2  Observations/Objective: Today's Vitals   01/06/23 1000  Weight: 188 lb (85.3 kg)  Height: 5\' 4"  (1.626 m)   Physical Exam Patient is alert and no acute distress noted.   Assessment and Plan: Viral gastroenteritis Assessment & Plan: Increase oral fluid intake. Bland diet as tolerated. Avoid fluids that have a lot sugar or caffeine, Imodium 2 mg for diarrhea. Avoid spicy or fatty food. Avoid alcohol. Can take OTC tylenol for  pain. Follow-up in unable to keep food/fluid down x 24 hours, dizziness, fevers, worsening or persistent symptoms to present to ED or contact primary care provider. Patient verbalizes understanding regarding plan of care and all questions answered.  Advise patient to follow up with GI for worsening symptoms      Follow Up Instructions: No follow-ups on file.   I discussed the assessment and treatment plan with the patient. The patient was provided an opportunity to  ask questions, and all were answered. The patient agreed with the plan and demonstrated an understanding of the instructions.   The patient was advised to call back or seek an in-person evaluation if the symptoms worsen or if the condition fails to improve as anticipated.  The above assessment and management plan was discussed with the patient. The patient verbalized understanding of and has agreed to the management plan.   Cruzita Lederer Newman Nip, FNP

## 2023-01-06 NOTE — Assessment & Plan Note (Signed)
Increase oral fluid intake. Bland diet as tolerated. Avoid fluids that have a lot sugar or caffeine, Imodium 2 mg for diarrhea. Avoid spicy or fatty food. Avoid alcohol. Can take OTC tylenol for pain. Follow-up in unable to keep food/fluid down x 24 hours, dizziness, fevers, worsening or persistent symptoms to present to ED or contact primary care provider. Patient verbalizes understanding regarding plan of care and all questions answered.  Advise patient to follow up with GI for worsening symptoms

## 2023-01-18 ENCOUNTER — Ambulatory Visit (INDEPENDENT_AMBULATORY_CARE_PROVIDER_SITE_OTHER): Payer: PRIVATE HEALTH INSURANCE | Admitting: Psychology

## 2023-01-18 ENCOUNTER — Ambulatory Visit: Payer: PRIVATE HEALTH INSURANCE | Admitting: Psychology

## 2023-01-18 DIAGNOSIS — F319 Bipolar disorder, unspecified: Secondary | ICD-10-CM | POA: Diagnosis not present

## 2023-01-18 NOTE — Progress Notes (Signed)
[facility] Counselor/Therapist Progress Note  Patient ID: Christina [name], MRN: [id number],    Date: 01/18/2023  Time Spent: 12:01pm-12:48pm  Treatment Type: Individual Therapy  Pt is seen for a virtual video visit via caregility.  Pt consents to telehealth session and is aware of limitations of telehealth visits. pt joins from her parked car, reporting privacy, and counselor from her home office.  Reported Symptoms:  Pt reports on some anxiety/sadness and grief.  Pt able to allow self to acknowledge feelings and not avoid  Mental Status Exam: Appearance:  Well Groomed     Behavior: Appropriate  Motor: Normal  Speech/Language:  Normal Rate  Affect: Appropriate  Mood: anxious and sad  Thought process: normal  Thought content:   WNL  Sensory/Perceptual disturbances:   WNL  Orientation: oriented to person, place, time/date, and situation  Attention: Good  Concentration: Good  Memory: WNL  Fund of knowledge:  Good  Insight:   Good  Judgment:  Good  Impulse Control: Good   Risk Assessment: Danger to Self:  No Self-injurious Behavior: No Danger to Others: No Duty to Warn:no Physical Aggression / Violence:No   Subjective: Counselor assessed pt current functioning per pt report.  Processed w/pt emotions and ways expressing emotions.  Reflected positive of being able to identify and express emotion and not push down.  Discussed grounding self and ways of monitoring self care. Pt affect wnl.  Pt reports some positives w/ husband's birthday celebrations and some challenges w/ grief on father's day etc.  Pt reported on allow self to acknowledge her emotions and that felt ok to do so.  Pt also reports that she was able to express some to supports.  Pt reported on keeping aware of not taking on too much for self and getting overwhelmed.        Interventions: Cognitive Behavioral Therapy and ACT and supportive  Diagnosis:Bipolar I disorder (HCC)  Plan: Pt to f/u w/  counseling in 1-2 week.   Pt to f/u w/ Dr. Lolly Mustache as scheduled.   Treatment Plan 01/27/22   Client Abilities/Strengths  supports: family- partner and mom, friends  positives: self awareness, expressing feelings, advocating for self. Motivated, hardworking, intelligent, family centered   Client Treatment Preferences  Weekly to biweekly counseling and continue medication management Dr. Lolly Mustache.    Client Statement of Needs  pt "to preserve my mental health.  Not be afraid to dream big.  Be able to continue to improve. Maintain compliance with my therapy, medications, holistic approaches.  I don't want to go back to where I was in the past"   Treatment Level  outpatient counseling    Symptoms  death of father 05-Apr-2021.  Husband dx w/ heart failure 11/2021. Grief and anxiety. History of at least one hypomanic, manic, or mixed mood episode.Marland Kitchen History of chronic or recurrent depression for which the client has taken antidepressant medication and had outpatient treatment,     Goals 1. Begin a healthy grieving process around the loss.   Objective Begin verbalizing feelings associated with the loss. Target Date: 2023-01-28 Frequency: Daily  Progress: 50 Modality: individual  Related Interventions Assist the client in identifying and expressing feelings connected with his/her loss.   2. continue to use calming skills to assist coping w/ stress and managing mood Objective Maintain positive self care, use of mindfulness and other grounding activities to maintain mood stability. Target Date: 2023-01-28 Frequency: Daily  Progress: 70 Modality: individual  Related Interventions Assist the client  in establishing a routine pattern of daily activities such as sleeping, eating, solitary and social activities, and exercise; use of relaxation and grounding strategies routine and assist in assessing mood.   3. Develop healthy cognitive patterns and beliefs about self and the world that lead to alleviation  and help prevent the relapse of mood episodes.   Objective Identify and replace thoughts and behaviors that trigger manic or depressive symptoms. Target Date: 2023-01-28 Frequency: Daily  Progress: 60 Modality: individual  Related Interventions utilize CBT strategies to assist in challenging negative thought patterns and beliefs and ACT strategies to assist in actions, decisions and interactions consistent w/values.   Client participated in Treatment plan development and provided verbal consent     Forde Radon, [facility]

## 2023-01-28 ENCOUNTER — Ambulatory Visit: Payer: PRIVATE HEALTH INSURANCE | Admitting: Psychology

## 2023-02-03 ENCOUNTER — Ambulatory Visit: Payer: PRIVATE HEALTH INSURANCE | Admitting: Psychology

## 2023-02-03 DIAGNOSIS — F319 Bipolar disorder, unspecified: Secondary | ICD-10-CM

## 2023-02-03 NOTE — Progress Notes (Signed)
[facility] Counselor/Therapist Progress Note  Patient ID: BROOKLIN HIMMELSPACH, MRN: [id number],    Date: 02/03/2023  Time Spent: 1:30pm-2:03pm  Treatment Type: Individual Therapy  Pt is seen for a virtual video visit via caregility.  Pt consents to telehealth session and is aware of limitations of telehealth visits. pt joins from her parked car, reporting privacy, and counselor from her home office.  Reported Symptoms:  Pt reports on decisions re: family reunioin, being able to talk about dad recent and allow for acknowledging emotions.  Worry re: husband's health  Mental Status Exam: Appearance:  Well Groomed     Behavior: Appropriate  Motor: Normal  Speech/Language:  Normal Rate  Affect: Appropriate  Mood: anxious  Thought process: normal  Thought content:   WNL  Sensory/Perceptual disturbances:   WNL  Orientation: oriented to person, place, time/date, and situation  Attention: Good  Concentration: Good  Memory: WNL  Fund of knowledge:  Good  Insight:   Good  Judgment:  Good  Impulse Control: Good   Risk Assessment: Danger to Self:  No Self-injurious Behavior: No Danger to Others: No Duty to Warn:no Physical Aggression / Violence:No   Subjective: Counselor assessed pt current functioning per pt report.  Processed w/pt emotions, positives and stressors.   Reflected positive of being able to acknowledge emotions/grief and find ways of honoring her dad.  Explored upcoming activities and focus for self care.    Pt affect wnl.  Pt reports needs short session due to work schedule.  Pt reports that she had made decision to attend her father's side family reunion and feels as way of honoring dad.  Pt also reported she was able to refer to dad talking today w/out avoiding or emotionally escalating.  Pt discussed worry for husband and continued added  health problems and recognizing can't impact but support family.  Pt discussed continuing to be mindful of not taking on too  much for stability.  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   Interventions: Cognitive Behavioral Therapy and ACT and supportive  Diagnosis:Bipolar I disorder (HCC)  Plan: Pt to f/u w/ counseling in 1-2 week.   Pt to f/u w/ Dr. Lolly Mustache as scheduled.   Treatment Plan 01/27/22   Client Abilities/Strengths  supports: family- partner and mom, friends  positives: self awareness, expressing feelings, advocating for self. Motivated, hardworking, intelligent, family centered   Client Treatment Preferences  Weekly to biweekly counseling and continue medication management Dr. Lolly Mustache.    Client Statement of Needs  pt "to preserve my mental health.  Not be afraid to dream big.  Be able to continue to improve. Maintain compliance with my therapy, medications, holistic approaches.  I don't want to go back to where I was in the past"   Treatment Level  outpatient counseling    Symptoms  death of father March 28, 2021.  Husband dx w/ heart failure 11/2021. Grief and anxiety. History of at least one hypomanic, manic, or mixed mood episode.Marland Kitchen History of chronic or recurrent depression for which the client has taken antidepressant medication and had outpatient treatment,     Goals 1. Begin a healthy grieving process around the loss.   Objective Begin verbalizing feelings associated with the loss. Target Date: 2024-02-03 Frequency: Daily  Progress: 60 Modality: individual  Related Interventions Assist the client in identifying and expressing feelings connected with his/her loss.   2. continue to use calming skills to assist coping w/ stress and managing mood Objective  Maintain positive self care, use of mindfulness and other grounding activities to maintain mood stability. Target Date: 2023-02-03 Frequency: Daily  Progress: 70 Modality: individual  Related Interventions Assist the client in establishing a routine pattern of daily activities such as sleeping, eating, solitary and social  activities, and exercise; use of relaxation and grounding strategies routine and assist in assessing mood.   3. Develop healthy cognitive patterns and beliefs about self and the world that lead to alleviation and help prevent the relapse of mood episodes.   Objective Identify and replace thoughts and behaviors that trigger manic or depressive symptoms. Target Date: 2024-02-03 Frequency: Daily  Progress: 60 Modality: individual  Related Interventions utilize CBT strategies to assist in challenging negative thought patterns and beliefs and ACT strategies to assist in actions, decisions and interactions consistent w/values.   Client participated in Treatment plan development and provided verbal consent       Forde Radon, [facility]

## 2023-02-15 ENCOUNTER — Other Ambulatory Visit (HOSPITAL_COMMUNITY): Payer: Self-pay

## 2023-02-15 DIAGNOSIS — F3162 Bipolar disorder, current episode mixed, moderate: Secondary | ICD-10-CM

## 2023-02-15 DIAGNOSIS — F419 Anxiety disorder, unspecified: Secondary | ICD-10-CM

## 2023-02-15 MED ORDER — ARIPIPRAZOLE 5 MG PO TABS
5.0000 mg | ORAL_TABLET | Freq: Every evening | ORAL | 0 refills | Status: DC
Start: 2023-02-15 — End: 2023-03-03

## 2023-02-16 ENCOUNTER — Ambulatory Visit (INDEPENDENT_AMBULATORY_CARE_PROVIDER_SITE_OTHER): Payer: PRIVATE HEALTH INSURANCE | Admitting: Psychology

## 2023-02-16 DIAGNOSIS — F319 Bipolar disorder, unspecified: Secondary | ICD-10-CM | POA: Diagnosis not present

## 2023-02-16 NOTE — Progress Notes (Addendum)
[facility] Counselor/Therapist Progress Note  Patient ID: Christina [name], MRN: [id number],    Date: 02/16/2023  Time Spent: 12:00pm-12:46pm  Treatment Type: Individual Therapy  Pt is seen for a virtual video visit via caregility.  Pt consents to telehealth session and is aware of limitations of telehealth visits. pt joins from her parked car, reporting privacy, and counselor from her home office.  Reported Symptoms:  Pt reports some anxiety and grief.   Mental Status Exam: Appearance:  Well Groomed     Behavior: Appropriate  Motor: Normal  Speech/Language:  Normal Rate  Affect: Appropriate  Mood: anxious  Thought process: normal  Thought content:   WNL  Sensory/Perceptual disturbances:   WNL  Orientation: oriented to person, place, time/date, and situation  Attention: Good  Concentration: Good  Memory: WNL  Fund of knowledge:  Good  Insight:   Good  Judgment:  Good  Impulse Control: Good   Risk Assessment: Danger to Self:  No Self-injurious Behavior: No Danger to Others: No Duty to Warn:no Physical Aggression / Violence:No   Subjective: Counselor assessed pt current functioning per pt report.  Processed w/pt emotions and transitions.   Reflected awareness of emotions/grief and expressing emoitions not avoiding.  Explored upcoming activities and stressors.  Discussed supports and communicating about transitions w/ her husband.   Pt affect wnl.  Pt reports feeling emotions of grief and reminders of dad. Pt discussed being intentional about honoring her dad and keeping aware of emotions.  Pt reported that had noticed trying to find excuse not to attend family reunion and how connected w/ grief.  Pt reports on positives of being able to have supports and those that keeping grounded and consistent.  Pt reported that has recognized need for talking w husband about how finances and roles changed since husband's illness and need to plan for this together.     Interventions: Cognitive Behavioral Therapy and ACT and supportive  Diagnosis:Bipolar I disorder (HCC)  Plan: Pt to f/u w/ counseling in 1-2 week.   Pt to f/u w/ Dr. Lolly Mustache as scheduled.   Treatment Plan 01/27/22   Client Abilities/Strengths  supports: family- partner and mom, friends  positives: self awareness, expressing feelings, advocating for self. Motivated, hardworking, intelligent, family centered   Client Treatment Preferences  Weekly to biweekly counseling and continue medication management Dr. Lolly Mustache.    Client Statement of Needs  pt "to preserve my mental health.  Not be afraid to dream big.  Be able to continue to improve. Maintain compliance with my therapy, medications, holistic approaches.  I don't want to go back to where I was in the past"   Treatment Level  outpatient counseling    Symptoms  death of father 2021/04/16.  Husband dx w/ heart failure 11/2021. Grief and anxiety. History of at least one hypomanic, manic, or mixed mood episode.Marland Kitchen History of chronic or recurrent depression for which the client has taken antidepressant medication and had outpatient treatment,     Goals 1. Begin a healthy grieving process around the loss.   Objective Begin verbalizing feelings associated with the loss. Target Date: 2024-02-03 Frequency: Daily  Progress: 60 Modality: individual  Related Interventions Assist the client in identifying and expressing feelings connected with his/her loss.   2. continue to use calming skills to assist coping w/ stress and managing mood Objective Maintain positive self care, use of mindfulness and other grounding activities to maintain mood stability. Target Date: 2024-02-03 Frequency: Daily  Progress: 70  Modality: individual  Related Interventions Assist the client in establishing a routine pattern of daily activities such as sleeping, eating, solitary and social activities, and exercise; use of relaxation and grounding strategies routine and  assist in assessing mood.   3. Develop healthy cognitive patterns and beliefs about self and the world that lead to alleviation and help prevent the relapse of mood episodes.   Objective Identify and replace thoughts and behaviors that trigger manic or depressive symptoms. Target Date: 2024-02-03 Frequency: Daily  Progress: 60 Modality: individual  Related Interventions utilize CBT strategies to assist in challenging negative thought patterns and beliefs and ACT strategies to assist in actions, decisions and interactions consistent w/values.   Client participated in Treatment plan development and provided verbal consent       Forde Radon, [facility]

## 2023-02-17 ENCOUNTER — Ambulatory Visit (INDEPENDENT_AMBULATORY_CARE_PROVIDER_SITE_OTHER): Payer: PRIVATE HEALTH INSURANCE | Admitting: Family Medicine

## 2023-02-17 ENCOUNTER — Encounter: Payer: Self-pay | Admitting: Family Medicine

## 2023-02-17 VITALS — BP 122/86 | HR 87 | Ht 64.0 in | Wt 187.0 lb

## 2023-02-17 DIAGNOSIS — Z13220 Encounter for screening for lipoid disorders: Secondary | ICD-10-CM

## 2023-02-17 DIAGNOSIS — Z6379 Other stressful life events affecting family and household: Secondary | ICD-10-CM

## 2023-02-17 DIAGNOSIS — E1169 Type 2 diabetes mellitus with other specified complication: Secondary | ICD-10-CM | POA: Diagnosis not present

## 2023-02-17 DIAGNOSIS — E1165 Type 2 diabetes mellitus with hyperglycemia: Secondary | ICD-10-CM

## 2023-02-17 DIAGNOSIS — A084 Viral intestinal infection, unspecified: Secondary | ICD-10-CM

## 2023-02-17 DIAGNOSIS — I10 Essential (primary) hypertension: Secondary | ICD-10-CM | POA: Diagnosis not present

## 2023-02-17 DIAGNOSIS — F3162 Bipolar disorder, current episode mixed, moderate: Secondary | ICD-10-CM

## 2023-02-17 DIAGNOSIS — N926 Irregular menstruation, unspecified: Secondary | ICD-10-CM

## 2023-02-17 NOTE — Patient Instructions (Addendum)
F/U in 4 months, call if you need me sooner  Endocrinology will manage your diabetes  Congrats on success so far, keep it up!  Address irregular bleeding with Gyne  Thankful you have recovered from recent stomach virus  Fasting lipid, cmp and eGFr , hBA1C [facility] in [place], to be obtained tomorrow, Nurse pls order  Nurse pls send for diabetic eye exam at [facility] [place], 09/2022  It is important that you exercise regularly at least 30 minutes 5 times a week. If you develop chest pain, have severe difficulty breathing, or feel very tired, stop exercising immediately and seek medical attention  Thanks for choosing [facility], we consider it a privelige to serve you.

## 2023-02-19 ENCOUNTER — Telehealth (HOSPITAL_COMMUNITY): Payer: PRIVATE HEALTH INSURANCE | Admitting: Psychiatry

## 2023-02-19 LAB — CMP14+EGFR
ALT: 9 IU/L (ref 0–32)
AST: 11 IU/L (ref 0–40)
BUN: 10 mg/dL (ref 6–24)
Calcium: 9.8 mg/dL (ref 8.7–10.2)
Creatinine, Ser: 0.8 mg/dL (ref 0.57–1.00)
Globulin, Total: 2.5 g/dL (ref 1.5–4.5)
Potassium: 4.5 mmol/L (ref 3.5–5.2)

## 2023-02-19 LAB — MAGNESIUM: Magnesium: 1.8 mg/dL (ref 1.6–2.3)

## 2023-02-19 LAB — LIPID PANEL
Chol/HDL Ratio: 1.7 ratio (ref 0.0–4.4)
LDL Chol Calc (NIH): 22 mg/dL (ref 0–99)

## 2023-02-19 LAB — HEMOGLOBIN A1C

## 2023-02-20 LAB — LIPID PANEL
Cholesterol, Total: 87 mg/dL — ABNORMAL LOW (ref 100–199)
HDL: 51 mg/dL (ref >39)
Triglycerides: 58 mg/dL (ref 0–149)
VLDL Cholesterol Cal: 14 mg/dL (ref 5–40)

## 2023-02-20 LAB — CMP14+EGFR
Albumin: 4.5 g/dL (ref 3.9–4.9)
Alkaline Phosphatase: 55 IU/L (ref 44–121)
BUN/Creatinine Ratio: 13 (ref 9–23)
Bilirubin Total: 0.3 mg/dL (ref 0.0–1.2)
CO2: 24 mmol/L (ref 20–29)
Chloride: 99 mmol/L (ref 96–106)
Glucose: 94 mg/dL (ref 70–99)
Sodium: 139 mmol/L (ref 134–144)
Total Protein: 7 g/dL (ref 6.0–8.5)
eGFR: 91 mL/min/{1.73_m2} (ref >59)

## 2023-02-22 ENCOUNTER — Encounter: Payer: Self-pay | Admitting: Family Medicine

## 2023-02-22 NOTE — Progress Notes (Unsigned)
   Christina [name]     MRN: [id number]      DOB: 1975-12-06  Chief Complaint  Patient presents with   Follow-up    Follow up     HPI Ms. Garcia is here for follow up and re-evaluation of chronic medical conditions, medication management and review of any available recent lab and radiology data.  Preventive health is updated, specifically  Cancer screening and Immunization.   Questions or concerns regarding consultations or procedures which the PT has had in the interim are  addressed.Has endo appt next week The PT denies any adverse reactions to current medications since the last visit.  C/o irregular menses worse in past 6 months, sees gyne and will address with gyne at next visit.  Recently treated, 1 month ago,  for acute gastroenteritis which has resolved  Denies polyuria, polydipsia, blurred vision , or hypoglycemic episodes.   ROS Denies recent fever or chills. Denies sinus pressure, nasal congestion, ear pain or sore throat. Denies chest congestion, productive cough or wheezing. Denies chest pains, palpitations and leg swelling Denies abdominal pain, nausea, vomiting,diarrhea or constipation.   Denies dysuria, frequency, hesitancy or incontinence. Denies joint pain, swelling and limitation in mobility. Denies headaches, seizures, numbness, or tingling. Denies  uncontrolled  depression, anxiety or insomnia. Denies skin break down or rash.   PE  BP 122/86 (BP Location: Right Arm, Patient Position: Sitting, Cuff Size: Large)   Pulse 87   Ht 5\' 4"  (1.626 m)   Wt 187 lb 0.6 oz (84.8 kg)   SpO2 96%   BMI 32.11 kg/m   Patient alert and oriented and in no cardiopulmonary distress.  HEENT: No facial asymmetry, EOMI,     Neck supple .  Chest: Clear to auscultation bilaterally.  CVS: S1, S2 no murmurs, no S3.Regular rate.  ABD: Soft non tender.   Ext: No edema  MS: Adequate ROM spine, shoulders, hips and knees.  Skin: Intact, no ulcerations or rash noted.  Psych: Good  eye contact, normal affect. Memory intact not anxious or depressed appearing.  CNS: CN 2-12 intact, power,  normal throughout.no focal deficits noted.   Assessment & Plan  No problem-specific Assessment & Plan notes found for this encounter.

## 2023-02-23 DIAGNOSIS — N926 Irregular menstruation, unspecified: Secondary | ICD-10-CM | POA: Insufficient documentation

## 2023-02-23 MED ORDER — ROSUVASTATIN CALCIUM 5 MG PO TABS: ORAL_TABLET | ORAL | 0 refills | Status: DC

## 2023-02-23 NOTE — Assessment & Plan Note (Signed)
Stable , managed by Psych

## 2023-02-23 NOTE — Assessment & Plan Note (Signed)
Improved, management per Endo Christina [name] is reminded of the importance of commitment to daily physical activity for 30 minutes or more, as able and the need to limit carbohydrate intake to 30 to 60 grams per meal to help with blood sugar control.   The need to take medication as prescribed, test blood sugar as directed, and to call between visits if there is a concern that blood sugar is uncontrolled is also discussed.   Christina [name] is reminded of the importance of daily foot exam, annual eye examination, and good blood sugar, blood pressure and cholesterol control.     Latest Ref Rng & Units 02/18/2023   11:27 AM 11/12/2022    2:21 PM 05/20/2022    9:41 AM 03/13/2022    1:50 PM 12/23/2021   10:10 AM  Diabetic Labs  HbA1c 4.8 - 5.6 % 7.2  8.7    7.1   Micro/Creat Ratio 0 - 29 mg/g creat  40      Chol 100 - 199 mg/dL 87   956     HDL >21 mg/dL 51   78     Calc LDL 0 - 99 mg/dL 22   26     Triglycerides 0 - 149 mg/dL 58   71     Creatinine 0.57 - 1.00 mg/dL 3.08  6.57  8.46  9.62  0.78       02/17/2023   10:49 AM 01/06/2023   10:00 AM 11/19/2022   10:29 AM 11/11/2022   11:35 AM 11/02/2022    2:06 PM 08/20/2022    8:49 AM 05/20/2022    9:49 AM  BP/Weight  Systolic BP 122   127 115    Diastolic BP 86   86 79    Wt. (Lbs) 187.04 188  206.12 208.2    BMI 32.11 kg/m2 32.27 kg/m2  35.38 kg/m2 35.19 kg/m2       Information is confidential and restricted. Go to Review Flowsheets to unlock data.      Latest Ref Rng & Units 12/18/2022   12:00 AM 05/05/2022    2:20 PM  Foot/eye exam completion dates  Eye Exam No Retinopathy No Retinopathy       Foot Form Completion   Done     This result is from an external source.

## 2023-02-23 NOTE — Assessment & Plan Note (Signed)
Likely perimenopausal, Gyne to address

## 2023-02-23 NOTE — Assessment & Plan Note (Signed)
improvinmg

## 2023-02-23 NOTE — Assessment & Plan Note (Signed)
Improved  Patient re-educated about  the importance of commitment to a  minimum of 150 minutes of exercise per week as able.  The importance of healthy food choices with portion control discussed, as well as eating regularly and within a 12 hour window most days. The need to choose "clean , green" food 50 to 75% of the time is discussed, as well as to make water the primary drink and set a goal of 64 ounces water daily.       02/17/2023   10:49 AM 01/06/2023   10:00 AM 11/19/2022   10:29 AM  Weight /BMI  Weight 187 lb 0.6 oz 188 lb   Height 5\' 4"  (1.626 m) 5\' 4"  (1.626 m)   BMI 32.11 kg/m2 32.27 kg/m2      Information is confidential and restricted. Go to Review Flowsheets to unlock data.

## 2023-02-23 NOTE — Assessment & Plan Note (Signed)
Resolved fully

## 2023-02-25 ENCOUNTER — Telehealth (HOSPITAL_COMMUNITY): Payer: PRIVATE HEALTH INSURANCE | Admitting: Psychiatry

## 2023-03-01 ENCOUNTER — Ambulatory Visit (INDEPENDENT_AMBULATORY_CARE_PROVIDER_SITE_OTHER): Payer: PRIVATE HEALTH INSURANCE | Admitting: Psychology

## 2023-03-01 ENCOUNTER — Ambulatory Visit
Admission: RE | Admit: 2023-03-01 | Discharge: 2023-03-01 | Disposition: A | Discharge status: Home / Self Care | Payer: PRIVATE HEALTH INSURANCE | Source: Ambulatory Visit | Attending: Family Medicine | Admitting: Family Medicine

## 2023-03-01 ENCOUNTER — Ambulatory Visit: Payer: Self-pay

## 2023-03-01 ENCOUNTER — Other Ambulatory Visit: Payer: Self-pay | Admitting: Family Medicine

## 2023-03-01 DIAGNOSIS — F319 Bipolar disorder, unspecified: Secondary | ICD-10-CM

## 2023-03-01 DIAGNOSIS — Z1231 Encounter for screening mammogram for malignant neoplasm of breast: Secondary | ICD-10-CM

## 2023-03-01 DIAGNOSIS — D242 Benign neoplasm of left breast: Secondary | ICD-10-CM

## 2023-03-01 NOTE — Progress Notes (Signed)
[facility] Counselor/Therapist Progress Note  Patient ID: Christina [name], MRN: [id number],    Date: 03/01/2023  Time Spent: 4:30pm-5:25pm  Treatment Type: Individual Therapy  Pt is seen for a virtual video visit via caregility.  Pt consents to telehealth session and is aware of limitations of telehealth visits. pt joins from her home, reporting privacy, and counselor from her home office.  Reported Symptoms:  Pt reports anxiety about health and grief of dad.   Mental Status Exam: Appearance:  Well Groomed     Behavior: Appropriate  Motor: Normal  Speech/Language:  Normal Rate  Affect: Appropriate  Mood: anxious  Thought process: normal  Thought content:   WNL  Sensory/Perceptual disturbances:   WNL  Orientation: oriented to person, place, time/date, and situation  Attention: Good  Concentration: Good  Memory: WNL  Fund of knowledge:  Good  Insight:   Good  Judgment:  Good  Impulse Control: Good   Risk Assessment: Danger to Self:  No Self-injurious Behavior: No Danger to Others: No Duty to Warn:no Physical Aggression / Violence:No   Subjective: Counselor assessed pt current functioning per pt report.  Processed w/pt emotions and new information from her appointments today.   Assisted pt in process emotions related to info and coping w/ focus on facts and taking next steps.  Explored weekend at family reunion her grief and emotions surrounding and awareness of grieving changes w/ husband's health over past year.   Pt affect wnl.  Pt reports had planned on talking about her grief and family reunion today and had several appointments and new worries that arose from her mammogram, gynecologist and endocrinologist appointment.  Pt recognizing still processing info of further testing needed to dx concerns.  Pt is focus on support and acknowledging anxiety and taking next steps to dx.  Pt also discussed insights over past week and grieving changes in relationship and  dynamics w/ husband's illness.    Interventions: Cognitive Behavioral Therapy and ACT and supportive  Diagnosis:Bipolar I disorder (HCC)  Plan: Pt to f/u w/ counseling in 1-2 week.   Pt to f/u w/ Dr. Lolly [name] as scheduled.   Treatment Plan 01/27/22   Client Abilities/Strengths  supports: family- partner and mom, friends  positives: self awareness, expressing feelings, advocating for self. Motivated, hardworking, intelligent, family centered   Client Treatment Preferences  Weekly to biweekly counseling and continue medication management Dr. Lolly [name].    Client Statement of Needs  pt "to preserve my mental health.  Not be afraid to dream big.  Be able to continue to improve. Maintain compliance with my therapy, medications, holistic approaches.  I don't want to go back to where I was in the past"   Treatment Level  outpatient counseling    Symptoms  death of father 2021/03/31.  Husband dx w/ heart failure 11/2021. Grief and anxiety. History of at least one hypomanic, manic, or mixed mood episode.Marland Kitchen History of chronic or recurrent depression for which the client has taken antidepressant medication and had outpatient treatment,     Goals 1. Begin a healthy grieving process around the loss.   Objective Begin verbalizing feelings associated with the loss. Target Date: 2024-02-03 Frequency: Daily  Progress: 60 Modality: individual  Related Interventions Assist the client in identifying and expressing feelings connected with his/her loss.   2. continue to use calming skills to assist coping w/ stress and managing mood Objective Maintain positive self care, use of mindfulness and other grounding activities to maintain mood  stability. Target Date: 2024-02-03 Frequency: Daily  Progress: 70 Modality: individual  Related Interventions Assist the client in establishing a routine pattern of daily activities such as sleeping, eating, solitary and social activities, and exercise; use of relaxation and  grounding strategies routine and assist in assessing mood.   3. Develop healthy cognitive patterns and beliefs about self and the world that lead to alleviation and help prevent the relapse of mood episodes.   Objective Identify and replace thoughts and behaviors that trigger manic or depressive symptoms. Target Date: 2024-02-03 Frequency: Daily  Progress: 60 Modality: individual  Related Interventions utilize CBT strategies to assist in challenging negative thought patterns and beliefs and ACT strategies to assist in actions, decisions and interactions consistent w/values.   Client participated in Treatment plan development and provided verbal consent         Christina [name], [facility]

## 2023-03-03 ENCOUNTER — Telehealth (HOSPITAL_COMMUNITY): Payer: PRIVATE HEALTH INSURANCE | Admitting: Psychiatry

## 2023-03-03 ENCOUNTER — Encounter (HOSPITAL_COMMUNITY): Payer: Self-pay | Admitting: Psychiatry

## 2023-03-03 DIAGNOSIS — F419 Anxiety disorder, unspecified: Secondary | ICD-10-CM

## 2023-03-03 DIAGNOSIS — F3162 Bipolar disorder, current episode mixed, moderate: Secondary | ICD-10-CM | POA: Diagnosis not present

## 2023-03-03 MED ORDER — ARIPIPRAZOLE 5 MG PO TABS
5.0000 mg | ORAL_TABLET | Freq: Every evening | ORAL | 1 refills | Status: DC
Start: 2023-03-03 — End: 2023-09-02

## 2023-03-03 NOTE — Progress Notes (Signed)
[facility] MD Virtual Progress Note   Patient Location: In Car Provider Location: Home Office  I connect with patient by video and verified that I am speaking with correct person by using two identifiers. I discussed the limitations of evaluation and management by telemedicine and the availability of in person appointments. I also discussed with the patient that there may be a patient responsible charge related to this service. The patient expressed understanding and agreed to proceed.  Christina [name] [id number] 47 y.o.  03/03/2023 3:28 PM  History of Present Illness:  Patient is evaluated by video session.  She is in the car.  She reported recently came back from family reunion which was done in [place].  She reported it is emotional because in 2022 after the reunion her father died.  Patient told recently had a visit with her therapist Leanne.  Otherwise she reported things are going very well.  She did not like Mounjaro as blood sugar is much better and weight reduced from the past.  She denies any hallucination, paranoia, suicidal thoughts.  Her energy level is good.  Her husband still not able to function because he has CHF and uses oxygen.  His blood pressure is still not stable.  Patient denies any panic attack, crying spells or any feeling of hopelessness.  She sleeps good.  She has no tremors, shakes or any EPS.  She does not want to change the medication.  Past Psychiatric History: H/O depression, anxiety, mania and psychosis.  Admitted in 1997 at [facility] due to suicidal gestures.  Seen in this office since 2006. Tried Prozac and Wellbutrin.      Outpatient Encounter Medications as of 03/03/2023  Medication Sig   acetaminophen (TYLENOL) 500 MG tablet Take 1,000 mg every 8 (eight) hours as needed by mouth for mild pain.    ARIPiprazole (ABILIFY) 5 MG tablet Take 1 tablet (5 mg total) by mouth every evening.   azelastine (ASTELIN) 0.1 % nasal spray Place 2 sprays into  both nostrils 2 (two) times daily. Use in each nostril as directed   Empagliflozin-metFORMIN HCl ER (SYNJARDY XR) 12.12-998 MG TB24 Take two tablets by mouth once daily for diabetes   fluticasone (FLONASE) 50 MCG/ACT nasal spray Place 2 sprays into both nostrils daily.   levocetirizine (XYZAL) 5 MG tablet Take 5 mg by mouth every evening.   loperamide (IMODIUM A-D) 2 MG tablet Take 1 tablet (2 mg total) by mouth 4 (four) times daily as needed for diarrhea or loose stools.   losartan (COZAAR) 50 MG tablet Take 1 tablet (50 mg total) by mouth daily.   montelukast (SINGULAIR) 10 MG tablet Take 1 tablet (10 mg total) by mouth at bedtime.   omeprazole (PRILOSEC) 40 MG capsule Take 1 capsule (40 mg total) by mouth daily.   ondansetron (ZOFRAN) 4 MG tablet Take 1 tablet (4 mg total) by mouth every 8 (eight) hours as needed for nausea or vomiting.   rosuvastatin (CRESTOR) 5 MG tablet Take 1 tablet by mouth three times weekly.   tirzepatide [facility]) 5 MG/0.5ML Pen Inject 5 mg into the skin once a week.   No facility-administered encounter medications on file as of 03/03/2023.    Recent Results (from the past 2160 hour(s))  HM DIABETES EYE EXAM     Status: None   Collection Time: 12/18/22 12:00 AM  Result Value Ref Range   HM Diabetic Eye Exam No Retinopathy No Retinopathy  Lipid panel     Status:  Abnormal   Collection Time: 02/18/23 11:27 AM  Result Value Ref Range   Cholesterol, Total 87 (L) 100 - 199 mg/dL   Triglycerides 58 0 - 149 mg/dL   HDL 51 >09 mg/dL   VLDL Cholesterol Cal 14 5 - 40 mg/dL   LDL Chol Calc (NIH) 22 0 - 99 mg/dL   Chol/HDL Ratio 1.7 0.0 - 4.4 ratio    Comment:                                   T. Chol/HDL Ratio                                             Men  Women                               1/2 Avg.Risk  3.4    3.3                                   Avg.Risk  5.0    4.4                                2X Avg.Risk  9.6    7.1                                3X  Avg.Risk 23.4   11.0   CMP14+EGFR     Status: None   Collection Time: 02/18/23 11:27 AM  Result Value Ref Range   Glucose 94 70 - 99 mg/dL   BUN 10 6 - 24 mg/dL   Creatinine, Ser 8.11 0.57 - 1.00 mg/dL   eGFR 91 >91 YN/WGN/5.62   BUN/Creatinine Ratio 13 9 - 23   Sodium 139 134 - 144 mmol/L   Potassium 4.5 3.5 - 5.2 mmol/L   Chloride 99 96 - 106 mmol/L   CO2 24 20 - 29 mmol/L   Calcium 9.8 8.7 - 10.2 mg/dL   Total Protein 7.0 6.0 - 8.5 g/dL   Albumin 4.5 3.9 - 4.9 g/dL   Globulin, Total 2.5 1.5 - 4.5 g/dL   Bilirubin Total 0.3 0.0 - 1.2 mg/dL   Alkaline Phosphatase 55 44 - 121 IU/L   AST 11 0 - 40 IU/L   ALT 9 0 - 32 IU/L  Hemoglobin A1c     Status: Abnormal   Collection Time: 02/18/23 11:27 AM  Result Value Ref Range   Hgb A1c MFr Bld 7.2 (H) 4.8 - 5.6 %    Comment:          Prediabetes: 5.7 - 6.4          Diabetes: >6.4          Glycemic control for adults with diabetes: <7.0    Est. average glucose Bld gHb Est-mCnc 160 mg/dL  Magnesium     Status: None   Collection Time: 02/18/23 11:30 AM  Result Value Ref Range   Magnesium 1.8 1.6 - 2.3 mg/dL     Psychiatric Specialty Exam: Physical Exam  Review of Systems  Weight 187 lb (84.8 kg), unknown if currently breastfeeding.There is no height or weight on file to calculate BMI.  General Appearance: Casual  Eye Contact:  Good  Speech:  Clear and Coherent  Volume:  Normal  Mood:  Euthymic  Affect:  Appropriate  Thought Process:  Goal Directed  Orientation:  Full (Time, Place, and Person)  Thought Content:  Logical  Suicidal Thoughts:  No  Homicidal Thoughts:  No  Memory:  Immediate;   Good Recent;   Good Remote;   Good  Judgement:  Good  Insight:  Good  Psychomotor Activity:  Normal  Concentration:  Concentration: Good and Attention Span: Good  Recall:  Good  Fund of Knowledge:  Good  Language:  Good  Akathisia:  No  Handed:  Right  AIMS (if indicated):     Assets:  Communication Skills Desire for  Improvement Housing Social Support Talents/Skills Transportation  ADL's:  Intact  Cognition:  WNL  Sleep:  ok     Assessment/Plan: Anxiety - Plan: ARIPiprazole (ABILIFY) 5 MG tablet  Bipolar 1 disorder, mixed, moderate (HCC) - Plan: ARIPiprazole (ABILIFY) 5 MG tablet  Patient is stable on current medication.  I reviewed blood work results.  Hemoglobin A1c improved from the past.  She also lost weight.  Continue Abilify 5 mg daily.  Her anxiety and mania is stable.  Recommended to call us back if she has any question or any concern.  Follow-up in 6 months.   Follow Up Instructions:     I discussed the assessment and treatment plan with the patient. The patient was provided an opportunity to ask questions and all were answered. The patient agreed with the plan and demonstrated an understanding of the instructions.   The patient was advised to call back or seek an in-person evaluation if the symptoms worsen or if the condition fails to improve as anticipated.    Collaboration of Care: Other provider involved in patient's care AEB notes are available in epic to review.  Patient/Guardian was advised Release of Information must be obtained prior to any record release in order to collaborate their care with an outside provider. Patient/Guardian was advised if they have not already done so to contact the registration department to sign all necessary forms in order for [place] to release information regarding their care.   Consent: Patient/Guardian gives verbal consent for treatment and assignment of benefits for services provided during this visit. Patient/Guardian expressed understanding and agreed to proceed.     I provided 19 minutes of non face to face time during this encounter.  Note: This document was prepared by [employer] voice dictation technology and any errors that results from this process are unintentional.    Cleotis Nipper, MD 03/03/2023

## 2023-03-10 ENCOUNTER — Other Ambulatory Visit: Payer: Self-pay | Admitting: Family Medicine

## 2023-03-10 ENCOUNTER — Ambulatory Visit: Payer: PRIVATE HEALTH INSURANCE | Admitting: Psychology

## 2023-03-10 ENCOUNTER — Ambulatory Visit
Admission: RE | Admit: 2023-03-10 | Discharge: 2023-03-10 | Disposition: A | Discharge status: Home / Self Care | Payer: PRIVATE HEALTH INSURANCE | Source: Ambulatory Visit | Attending: Family Medicine | Admitting: Family Medicine

## 2023-03-10 DIAGNOSIS — N63 Unspecified lump in unspecified breast: Secondary | ICD-10-CM

## 2023-03-10 DIAGNOSIS — D242 Benign neoplasm of left breast: Secondary | ICD-10-CM

## 2023-03-11 ENCOUNTER — Ambulatory Visit (INDEPENDENT_AMBULATORY_CARE_PROVIDER_SITE_OTHER): Payer: PRIVATE HEALTH INSURANCE | Admitting: Psychology

## 2023-03-11 DIAGNOSIS — F319 Bipolar disorder, unspecified: Secondary | ICD-10-CM | POA: Diagnosis not present

## 2023-03-11 DIAGNOSIS — F4321 Adjustment disorder with depressed mood: Secondary | ICD-10-CM | POA: Diagnosis not present

## 2023-03-11 NOTE — Progress Notes (Signed)
[facility] Counselor/Therapist Progress Note  Patient ID: Christina [name], MRN: [id number],    Date: 03/11/2023  Time Spent: 12:02pm-12:47pm  Treatment Type: Individual Therapy  Pt is seen for a virtual video visit via caregility.  Pt consents to telehealth session and is aware of limitations of telehealth visits. pt joins from her home, reporting privacy, and counselor from her home office.  Reported Symptoms:  Pt reports grief symptoms w/ anniversary of dad's death approaching.  Pt reports awareness of needing to protect her time for self.    Mental Status Exam: Appearance:  Well Groomed     Behavior: Appropriate  Motor: Normal  Speech/Language:  Normal Rate  Affect: Appropriate  Mood: anxious  Thought process: normal  Thought content:   WNL  Sensory/Perceptual disturbances:   WNL  Orientation: oriented to person, place, time/date, and situation  Attention: Good  Concentration: Good  Memory: WNL  Fund of knowledge:  Good  Insight:   Good  Judgment:  Good  Impulse Control: Good   Risk Assessment: Danger to Self:  No Self-injurious Behavior: No Danger to Others: No Duty to Warn:no Physical Aggression / Violence:No   Subjective: Counselor assessed pt current functioning per pt report.  Processed w/pt emotions w/ grief, stressors and positives.  Explored interactions w/ family and friends and reflected recognizing positive impact have on others.  Discussed self care and ways of taking time for self and balancing w/ other commitments.  Pt affect wnl.  Pt reports of results of recent dx mammogram.  Pt discussed upcoming anniversary of dad's death and verbalized emotions and how coping.  Pt identified insight into how positive for her to keep enaged but not overcommitting and trying to find blance of attending to her kids.  Pt discussed how having to shift and husband taking primary lead for autistic's son tx meetings.  Pt discussed protecting her peace and focus on  her self care and time she is allotting to herself.    Interventions: Cognitive Behavioral Therapy and ACT and supportive  Diagnosis:Bipolar I disorder (HCC)  Grief  Plan: Pt to f/u w/ counseling in 1-2 week.   Pt to f/u w/ Dr. Lolly Mustache as scheduled.   Treatment Plan 02/03/23   Client Abilities/Strengths  supports: family- partner and mom, friends  positives: self awareness, expressing feelings, advocating for self. Motivated, hardworking, intelligent, family centered   Client Treatment Preferences  Weekly to biweekly counseling and continue medication management Dr. Lolly Mustache.    Client Statement of Needs  pt  "Need to continue w/ my therapy and compliance w/ my medication management- I don't want to go back to that (when mental health unstable).  to continue to support me- give my safe place continue talk about what I need.  I am working on protecting my peace".  .    Treatment Level  outpatient counseling    Symptoms  death of father 2021-03-16.  Husband dx w/ heart failure 11/2021. Grief and anxiety. History of at least one hypomanic, manic, or mixed mood episode.Marland Kitchen History of chronic or recurrent depression for which the client has taken antidepressant medication and had outpatient treatment,     Goals 1. Begin a healthy grieving process around the loss.   Objective Begin verbalizing feelings associated with the loss. Target Date: 2024-02-03 Frequency: Daily  Progress: 60 Modality: individual  Related Interventions Assist the client in identifying and expressing feelings connected with his/her loss.   2. continue to use calming skills to assist  coping w/ stress and managing mood Objective Maintain positive self care, use of mindfulness and other grounding activities to maintain mood stability. Target Date: 2024-02-03 Frequency: Daily  Progress: 70 Modality: individual  Related Interventions Assist the client in establishing a routine pattern of daily activities such as sleeping,  eating, solitary and social activities, and exercise; use of relaxation and grounding strategies routine and assist in assessing mood.   3. Develop healthy cognitive patterns and beliefs about self and the world that lead to alleviation and help prevent the relapse of mood episodes.   Objective Identify and replace thoughts and behaviors that trigger manic or depressive symptoms. Target Date: 2024-02-03 Frequency: Daily  Progress: 60 Modality: individual  Related Interventions utilize CBT strategies to assist in challenging negative thought patterns and beliefs and ACT strategies to assist in actions, decisions and interactions consistent w/values.   Client participated in Treatment plan development and provided verbal consent.              Forde Radon, [facility]

## 2023-03-16 ENCOUNTER — Ambulatory Visit (INDEPENDENT_AMBULATORY_CARE_PROVIDER_SITE_OTHER): Payer: PRIVATE HEALTH INSURANCE | Admitting: Psychology

## 2023-03-16 DIAGNOSIS — F319 Bipolar disorder, unspecified: Secondary | ICD-10-CM | POA: Diagnosis not present

## 2023-03-16 DIAGNOSIS — F4321 Adjustment disorder with depressed mood: Secondary | ICD-10-CM

## 2023-03-16 NOTE — Progress Notes (Signed)
[facility] Counselor/Therapist Progress Note  Patient ID: HEMLATA VARNADORE, MRN: [id number],    Date: 03/16/2023  Time Spent: 2:34pm-3:15pm  Treatment Type: Individual Therapy  Pt is seen for a virtual video visit via caregility.  Pt consents to telehealth session and is aware of limitations of telehealth visits. pt joins from her parked car, reporting privacy, and counselor from her home office.  Reported Symptoms:  Pt able to express emotions around grief.  Pt discussed awareness of steps taking to not overwhelm.     Mental Status Exam: Appearance:  Well Groomed     Behavior: Appropriate  Motor: Normal  Speech/Language:  Normal Rate  Affect: Appropriate  Mood: anxious  Thought process: normal  Thought content:   WNL  Sensory/Perceptual disturbances:   WNL  Orientation: oriented to person, place, time/date, and situation  Attention: Good  Concentration: Good  Memory: WNL  Fund of knowledge:  Good  Insight:   Good  Judgment:  Good  Impulse Control: Good   Risk Assessment: Danger to Self:  No Self-injurious Behavior: No Danger to Others: No Duty to Warn:no Physical Aggression / Violence:No   Subjective: Counselor assessed pt current functioning per pt report.  Processed w/pt emotions w/ grief and recent medical stressors.  Explored interactions w/ family and expressing grief in interactions w/.  Reflected worry re: recent lab results and acknowledging steps taking for self care and wellness.  Pt affect wnl.  Pt reports results indicating elevate cortisol levels.  Pt reports further f/u needed w/ her endocrinologist and worry re:Marland Kitchen  Pt also reports upcoming appt w/ GYN re: concerns.  Pt related her recent interactions w/ son around reminders of dad and feeling connection w/. Pt discussed positive to be able to talk about w/ son.  Pt discussed upcoming commitments and not taking on more.   Interventions: Cognitive Behavioral Therapy and ACT and  supportive  Diagnosis:Bipolar I disorder (HCC)  Grief  Plan: Pt to f/u w/ counseling in 1-2 week.   Pt to f/u w/ Dr. Lolly Mustache as scheduled.   Treatment Plan 02/03/23   Client Abilities/Strengths  supports: family- partner and mom, friends  positives: self awareness, expressing feelings, advocating for self. Motivated, hardworking, intelligent, family centered   Client Treatment Preferences  Weekly to biweekly counseling and continue medication management Dr. Lolly Mustache.    Client Statement of Needs  pt  "Need to continue w/ my therapy and compliance w/ my medication management- I don't want to go back to that (when mental health unstable).  to continue to support me- give my safe place continue talk about what I need.  I am working on protecting my peace".  .    Treatment Level  outpatient counseling    Symptoms  death of father 04/02/2021.  Husband dx w/ heart failure 11/2021. Grief and anxiety. History of at least one hypomanic, manic, or mixed mood episode.Marland Kitchen History of chronic or recurrent depression for which the client has taken antidepressant medication and had outpatient treatment,     Goals 1. Begin a healthy grieving process around the loss.   Objective Begin verbalizing feelings associated with the loss. Target Date: 2024-02-03 Frequency: Daily  Progress: 60 Modality: individual  Related Interventions Assist the client in identifying and expressing feelings connected with his/her loss.   2. continue to use calming skills to assist coping w/ stress and managing mood Objective Maintain positive self care, use of mindfulness and other grounding activities to maintain mood stability. Target Date:  2024-02-03 Frequency: Daily  Progress: 70 Modality: individual  Related Interventions Assist the client in establishing a routine pattern of daily activities such as sleeping, eating, solitary and social activities, and exercise; use of relaxation and grounding strategies routine and  assist in assessing mood.   3. Develop healthy cognitive patterns and beliefs about self and the world that lead to alleviation and help prevent the relapse of mood episodes.   Objective Identify and replace thoughts and behaviors that trigger manic or depressive symptoms. Target Date: 2024-02-03 Frequency: Daily  Progress: 60 Modality: individual  Related Interventions utilize CBT strategies to assist in challenging negative thought patterns and beliefs and ACT strategies to assist in actions, decisions and interactions consistent w/values.   Client participated in Treatment plan development and provided verbal consent.              Forde Radon, [facility]

## 2023-03-17 ENCOUNTER — Ambulatory Visit: Payer: PRIVATE HEALTH INSURANCE | Admitting: Psychology

## 2023-03-29 ENCOUNTER — Ambulatory Visit (INDEPENDENT_AMBULATORY_CARE_PROVIDER_SITE_OTHER): Payer: PRIVATE HEALTH INSURANCE | Admitting: Psychology

## 2023-03-29 DIAGNOSIS — F319 Bipolar disorder, unspecified: Secondary | ICD-10-CM | POA: Diagnosis not present

## 2023-03-29 DIAGNOSIS — F4321 Adjustment disorder with depressed mood: Secondary | ICD-10-CM | POA: Diagnosis not present

## 2023-03-29 NOTE — Progress Notes (Signed)
[facility] Counselor/Therapist Progress Note  Patient ID: CLARESA CANALE, MRN: [id number],    Date: 03/29/2023  Time Spent: 12:00pm-12:46pm  Treatment Type: Individual Therapy  Pt is seen for a virtual video visit via caregility.  Pt consents to telehealth session and is aware of limitations of telehealth visits. pt joins from her parked car, reporting privacy, and counselor from her home office.  Reported Symptoms:  Pt expressed emotions of grief.  Pt discussed how she has been acknowledging and verbalizing to others.  Mental Status Exam: Appearance:  Well Groomed     Behavior: Appropriate  Motor: Normal  Speech/Language:  Normal Rate  Affect: Appropriate  Mood: Sad/grief  Thought process: normal  Thought content:   WNL  Sensory/Perceptual disturbances:   WNL  Orientation: oriented to person, place, time/date, and situation  Attention: Good  Concentration: Good  Memory: WNL  Fund of knowledge:  Good  Insight:   Good  Judgment:  Good  Impulse Control: Good   Risk Assessment: Danger to Self:  No Self-injurious Behavior: No Danger to Others: No Duty to Warn:no Physical Aggression / Violence:No   Subjective: Counselor assessed pt current functioning per pt report.  Processed w/pt emotions of grief w/ recent anniversary of father's death. Explored ways pt is acknowledging and expressing.   Pt affect wnl.  Pt reports it is her grief week w/ anniversary of father's death and his funeral.  Pt acknowledged awareness of grief present and also business of back to school, doctor appointments and meetings.  Pt shared that she has been able to acknowledge and communicating to other's that greiving and that has been beneficial.    Interventions: Cognitive Behavioral Therapy and ACT and supportive  Diagnosis:Bipolar I disorder (HCC)  Grief  Plan: Pt to f/u w/ counseling in 1-2 week.   Pt to f/u w/ Dr. Lolly Mustache as scheduled.   Treatment Plan 02/03/23   Client  Abilities/Strengths  supports: family- partner and mom, friends  positives: self awareness, expressing feelings, advocating for self. Motivated, hardworking, intelligent, family centered   Client Treatment Preferences  Weekly to biweekly counseling and continue medication management Dr. Lolly Mustache.    Client Statement of Needs  pt  "Need to continue w/ my therapy and compliance w/ my medication management- I don't want to go back to that (when mental health unstable).  to continue to support me- give my safe place continue talk about what I need.  I am working on protecting my peace".  .    Treatment Level  outpatient counseling    Symptoms  death of father 03-19-2021.  Husband dx w/ heart failure 11/2021. Grief and anxiety. History of at least one hypomanic, manic, or mixed mood episode.Marland Kitchen History of chronic or recurrent depression for which the client has taken antidepressant medication and had outpatient treatment,     Goals 1. Begin a healthy grieving process around the loss.   Objective Begin verbalizing feelings associated with the loss. Target Date: 2024-02-03 Frequency: Daily  Progress: 60 Modality: individual  Related Interventions Assist the client in identifying and expressing feelings connected with his/her loss.   2. continue to use calming skills to assist coping w/ stress and managing mood Objective Maintain positive self care, use of mindfulness and other grounding activities to maintain mood stability. Target Date: 2024-02-03 Frequency: Daily  Progress: 70 Modality: individual  Related Interventions Assist the client in establishing a routine pattern of daily activities such as sleeping, eating, solitary and social activities, and  exercise; use of relaxation and grounding strategies routine and assist in assessing mood.   3. Develop healthy cognitive patterns and beliefs about self and the world that lead to alleviation and help prevent the relapse of mood episodes.    Objective Identify and replace thoughts and behaviors that trigger manic or depressive symptoms. Target Date: 2024-02-03 Frequency: Daily  Progress: 60 Modality: individual  Related Interventions utilize CBT strategies to assist in challenging negative thought patterns and beliefs and ACT strategies to assist in actions, decisions and interactions consistent w/values.   Client participated in Treatment plan development and provided verbal consent.             Forde Radon, [facility]

## 2023-04-06 ENCOUNTER — Ambulatory Visit (INDEPENDENT_AMBULATORY_CARE_PROVIDER_SITE_OTHER): Payer: PRIVATE HEALTH INSURANCE | Admitting: Psychology

## 2023-04-06 DIAGNOSIS — F4321 Adjustment disorder with depressed mood: Secondary | ICD-10-CM

## 2023-04-06 DIAGNOSIS — F319 Bipolar disorder, unspecified: Secondary | ICD-10-CM | POA: Diagnosis not present

## 2023-04-06 NOTE — Progress Notes (Signed)
[facility] Counselor/Therapist Progress Note  Patient ID: Christina [name], MRN: [id number],    Date: 04/06/2023  Time Spent: 11:00am-11:44am  Treatment Type: Individual Therapy  Pt is seen for a virtual video visit via caregility.  Pt consents to telehealth session and is aware of limitations of telehealth visits. pt joins from her parked car, reporting privacy, and counselor from her home office.  Reported Symptoms:  Pt report on grief.  Pt discussed awareness of finding balance for self care.   Mental Status Exam: Appearance:  Well Groomed     Behavior: Appropriate  Motor: Normal  Speech/Language:  Normal Rate  Affect: Appropriate  Mood: Sad/grief  Thought process: normal  Thought content:   WNL  Sensory/Perceptual disturbances:   WNL  Orientation: oriented to person, place, time/date, and situation  Attention: Good  Concentration: Good  Memory: WNL  Fund of knowledge:  Good  Insight:   Good  Judgment:  Good  Impulse Control: Good   Risk Assessment: Danger to Self:  No Self-injurious Behavior: No Danger to Others: No Duty to Warn:no Physical Aggression / Violence:No   Subjective: Counselor assessed pt current functioning per pt report.  Processed w/pt emotions of grief and mood stability.  Explored business w/ school, work, family and wants to attend to children w/out disabilities as well as her son w/ autism.  Discussed ways to continue incorporating time for self care and activities that are self care for pt.  Pt affect wnl.  Pt reported that her weekend was restorative for her.  Pt reported on feelings of grief and moments that hit her over past week.  Pt also reported on positives and enjoying things and getting out of house on weekend.  Pt discussed challenges w/ balance of multiple roles and w/ child w/ disability.  Pt discussed plans for weekends upcoming and mindful that needs time for self as well.  Pt discussed her plans for self care and ways to  further explored and practice this week.      Interventions: Cognitive Behavioral Therapy, Solution-Oriented/Positive Psychology, and ACT and supportive  Diagnosis:Bipolar I disorder (HCC)  Grief  Plan: Pt to f/u w/ counseling in 1-2 week.   Pt to f/u w/ Dr. Lolly Mustache as scheduled.   Treatment Plan 02/03/23   Client Abilities/Strengths  supports: family- partner and mom, friends  positives: self awareness, expressing feelings, advocating for self. Motivated, hardworking, intelligent, family centered   Client Treatment Preferences  Weekly to biweekly counseling and continue medication management Dr. Lolly Mustache.    Client Statement of Needs  pt  "Need to continue w/ my therapy and compliance w/ my medication management- I don't want to go back to that (when mental health unstable).  to continue to support me- give my safe place continue talk about what I need.  I am working on protecting my peace".  .    Treatment Level  outpatient counseling    Symptoms  death of father 2021-03-31.  Husband dx w/ heart failure 11/2021. Grief and anxiety. History of at least one hypomanic, manic, or mixed mood episode.Marland Kitchen History of chronic or recurrent depression for which the client has taken antidepressant medication and had outpatient treatment,     Goals 1. Begin a healthy grieving process around the loss.   Objective Begin verbalizing feelings associated with the loss. Target Date: 2024-02-03 Frequency: Daily  Progress: 60 Modality: individual  Related Interventions Assist the client in identifying and expressing feelings connected with his/her loss.  2. continue to use calming skills to assist coping w/ stress and managing mood Objective Maintain positive self care, use of mindfulness and other grounding activities to maintain mood stability. Target Date: 2024-02-03 Frequency: Daily  Progress: 70 Modality: individual  Related Interventions Assist the client in establishing a routine pattern of daily  activities such as sleeping, eating, solitary and social activities, and exercise; use of relaxation and grounding strategies routine and assist in assessing mood.   3. Develop healthy cognitive patterns and beliefs about self and the world that lead to alleviation and help prevent the relapse of mood episodes.   Objective Identify and replace thoughts and behaviors that trigger manic or depressive symptoms. Target Date: 2024-02-03 Frequency: Daily  Progress: 60 Modality: individual  Related Interventions utilize CBT strategies to assist in challenging negative thought patterns and beliefs and ACT strategies to assist in actions, decisions and interactions consistent w/values.   Client participated in Treatment plan development and provided verbal consent.           Forde Radon, [facility]

## 2023-04-14 ENCOUNTER — Ambulatory Visit: Payer: PRIVATE HEALTH INSURANCE | Admitting: Psychology

## 2023-04-22 ENCOUNTER — Ambulatory Visit: Payer: PRIVATE HEALTH INSURANCE | Admitting: Psychology

## 2023-04-22 DIAGNOSIS — F4321 Adjustment disorder with depressed mood: Secondary | ICD-10-CM

## 2023-04-22 DIAGNOSIS — F319 Bipolar disorder, unspecified: Secondary | ICD-10-CM

## 2023-04-22 NOTE — Progress Notes (Signed)
[facility] Counselor/Therapist Progress Note  Patient ID: Christina [name], MRN: [id number],    Date: 04/22/2023  Time Spent: 12:00pm-12:47pm  Treatment Type: Individual Therapy  Pt is seen for a virtual video visit via caregility.  Pt consents to telehealth session and is aware of limitations of telehealth visits. pt joins from her parked car, reporting privacy, and counselor from her home office.  Reported Symptoms:  Pt reports some worries- but not escalating anxiety  Mental Status Exam: Appearance:  Well Groomed     Behavior: Appropriate  Motor: Normal  Speech/Language:  Normal Rate  Affect: Appropriate  Mood: anxious- some worries  Thought process: normal  Thought content:   WNL  Sensory/Perceptual disturbances:   WNL  Orientation: oriented to person, place, time/date, and situation  Attention: Good  Concentration: Good  Memory: WNL  Fund of knowledge:  Good  Insight:   Good  Judgment:  Good  Impulse Control: Good   Risk Assessment: Danger to Self:  No Self-injurious Behavior: No Danger to Others: No Duty to Warn:no Physical Aggression / Violence:No   Subjective: Counselor assessed pt current functioning per pt report.  Processed w/pt positives and stressors. Explored concerns and validated and normalized given stressors.  Discussed decision making and effective communication.  Reiterated preserving self care.  Pt affect wnl.  Pt reported that she as recovered from being sick.  Pt reports continued stressors w/ medical issues for self/husband.  Pt reports financial stressors and trying to plan for future w/her kids-college.  Pt dicussed communicating about w/ husband and son.  Pt also discussed things she is working on to advocate for autism events in their community.  Pt identified some opportunities for time for self care upcoming.   Interventions: Cognitive Behavioral Therapy, Solution-Oriented/Positive Psychology, and ACT and  supportive  Diagnosis:Bipolar I disorder (HCC)  Grief  Plan: Pt to f/u w/ counseling in 1-2 week.   Pt to f/u w/ Dr. Lolly Mustache as scheduled.   Treatment Plan 02/03/23   Client Abilities/Strengths  supports: family- partner and mom, friends  positives: self awareness, expressing feelings, advocating for self. Motivated, hardworking, intelligent, family centered   Client Treatment Preferences  Weekly to biweekly counseling and continue medication management Dr. Lolly Mustache.    Client Statement of Needs  pt  "Need to continue w/ my therapy and compliance w/ my medication management- I don't want to go back to that (when mental health unstable).  to continue to support me- give my safe place continue talk about what I need.  I am working on protecting my peace".  .    Treatment Level  outpatient counseling    Symptoms  death of father 03-28-2021.  Husband dx w/ heart failure 11/2021. Grief and anxiety. History of at least one hypomanic, manic, or mixed mood episode.Marland Kitchen History of chronic or recurrent depression for which the client has taken antidepressant medication and had outpatient treatment,     Goals 1. Begin a healthy grieving process around the loss.   Objective Begin verbalizing feelings associated with the loss. Target Date: 2024-02-03 Frequency: Daily  Progress: 60 Modality: individual  Related Interventions Assist the client in identifying and expressing feelings connected with his/her loss.   2. continue to use calming skills to assist coping w/ stress and managing mood Objective Maintain positive self care, use of mindfulness and other grounding activities to maintain mood stability. Target Date: 2024-02-03 Frequency: Daily  Progress: 70 Modality: individual  Related Interventions Assist the client in establishing  a routine pattern of daily activities such as sleeping, eating, solitary and social activities, and exercise; use of relaxation and grounding strategies routine and  assist in assessing mood.   3. Develop healthy cognitive patterns and beliefs about self and the world that lead to alleviation and help prevent the relapse of mood episodes.   Objective Identify and replace thoughts and behaviors that trigger manic or depressive symptoms. Target Date: 2024-02-03 Frequency: Daily  Progress: 60 Modality: individual  Related Interventions utilize CBT strategies to assist in challenging negative thought patterns and beliefs and ACT strategies to assist in actions, decisions and interactions consistent w/values.   Client participated in Treatment plan development and provided verbal consent.              Forde Radon, [facility]

## 2023-05-03 ENCOUNTER — Encounter (INDEPENDENT_AMBULATORY_CARE_PROVIDER_SITE_OTHER): Payer: Self-pay | Admitting: Pediatric Genetics

## 2023-05-03 ENCOUNTER — Ambulatory Visit (INDEPENDENT_AMBULATORY_CARE_PROVIDER_SITE_OTHER): Payer: PRIVATE HEALTH INSURANCE | Admitting: Psychology

## 2023-05-03 DIAGNOSIS — F319 Bipolar disorder, unspecified: Secondary | ICD-10-CM | POA: Diagnosis not present

## 2023-05-03 DIAGNOSIS — F4321 Adjustment disorder with depressed mood: Secondary | ICD-10-CM

## 2023-05-03 NOTE — Progress Notes (Signed)
[facility] Counselor/Therapist Progress Note  Patient ID: Christina [name], MRN: [id number],    Date: 05/03/2023  Time Spent: 12:01pm-12:47pm  Treatment Type: Individual Therapy  Pt is seen for a virtual video visit via caregility.  Pt consents to telehealth session and is aware of limitations of telehealth visits. pt joins from her parked car, reporting privacy, and counselor from her home office.  Reported Symptoms:  Pt reports feeling overwhelmed and needing to prioritize.  Mental Status Exam: Appearance:  Well Groomed     Behavior: Appropriate  Motor: Normal  Speech/Language:  Normal Rate  Affect: Appropriate  Mood: anxious/overwhelmed  Thought process: normal  Thought content:   WNL  Sensory/Perceptual disturbances:   WNL  Orientation: oriented to person, place, time/date, and situation  Attention: Good  Concentration: Good  Memory: WNL  Fund of knowledge:  Good  Insight:   Good  Judgment:  Good  Impulse Control: Good   Risk Assessment: Danger to Self:  No Self-injurious Behavior: No Danger to Others: No Duty to Warn:no Physical Aggression / Violence:No   Subjective: Counselor assessed pt current functioning per pt report.  Processed w/pt mood, positives and stressors.  Explored weekend and busy and limited down time.  Discussed worry for husband, children and meeting everyone's needs as well as self.  Encouraged to not take on new things and prioritize current and what can let go of.  Pt affect wnl.  Pt reported that weekend was busy between husband's MRI appointment, son's autism event and husband's cousin's funeral.  Pt was able to be not put unrealistic expectations for what couldn't get done over weekend and accepting.  Pt discussed worry for supporting each of her children and her husband's needs and her self care.  Pt recognized need to let go of some things and prioritize others.      Interventions: Cognitive Behavioral Therapy,  Solution-Oriented/Positive Psychology, and ACT and supportive  Diagnosis:Bipolar I disorder (HCC)  Grief  Plan: Pt to f/u w/ counseling in 1-2 week.   Pt to f/u w/ Dr. Lolly Mustache as scheduled.   Treatment Plan 02/03/23   Client Abilities/Strengths  supports: family- partner and mom, friends  positives: self awareness, expressing feelings, advocating for self. Motivated, hardworking, intelligent, family centered   Client Treatment Preferences  Weekly to biweekly counseling and continue medication management Dr. Lolly Mustache.    Client Statement of Needs  pt  "Need to continue w/ my therapy and compliance w/ my medication management- I don't want to go back to that (when mental health unstable).  to continue to support me- give my safe place continue talk about what I need.  I am working on protecting my peace".  .    Treatment Level  outpatient counseling    Symptoms  death of father 11-Apr-2021.  Husband dx w/ heart failure 11/2021. Grief and anxiety. History of at least one hypomanic, manic, or mixed mood episode.Marland Kitchen History of chronic or recurrent depression for which the client has taken antidepressant medication and had outpatient treatment,     Goals 1. Begin a healthy grieving process around the loss.   Objective Begin verbalizing feelings associated with the loss. Target Date: 2024-02-03 Frequency: Daily  Progress: 60 Modality: individual  Related Interventions Assist the client in identifying and expressing feelings connected with his/her loss.   2. continue to use calming skills to assist coping w/ stress and managing mood Objective Maintain positive self care, use of mindfulness and other grounding activities  to maintain mood stability. Target Date: 2024-02-03 Frequency: Daily  Progress: 70 Modality: individual  Related Interventions Assist the client in establishing a routine pattern of daily activities such as sleeping, eating, solitary and social activities, and exercise; use of  relaxation and grounding strategies routine and assist in assessing mood.   3. Develop healthy cognitive patterns and beliefs about self and the world that lead to alleviation and help prevent the relapse of mood episodes.   Objective Identify and replace thoughts and behaviors that trigger manic or depressive symptoms. Target Date: 2024-02-03 Frequency: Daily  Progress: 60 Modality: individual  Related Interventions utilize CBT strategies to assist in challenging negative thought patterns and beliefs and ACT strategies to assist in actions, decisions and interactions consistent w/values.   Client participated in Treatment plan development and provided verbal consent.              Forde Radon [facility]   [place], [facility]

## 2023-05-12 ENCOUNTER — Ambulatory Visit: Payer: PRIVATE HEALTH INSURANCE | Admitting: Psychology

## 2023-05-18 ENCOUNTER — Ambulatory Visit (INDEPENDENT_AMBULATORY_CARE_PROVIDER_SITE_OTHER): Payer: PRIVATE HEALTH INSURANCE | Admitting: Psychology

## 2023-05-18 DIAGNOSIS — F4321 Adjustment disorder with depressed mood: Secondary | ICD-10-CM | POA: Diagnosis not present

## 2023-05-18 DIAGNOSIS — F319 Bipolar disorder, unspecified: Secondary | ICD-10-CM

## 2023-05-18 NOTE — Progress Notes (Signed)
[facility] Counselor/Therapist Progress Note  Patient ID: Christina [name], MRN: [id number],    Date: 05/18/2023  Time Spent: 1:31pm-2:18pm  Treatment Type: Individual Therapy  Pt is seen for a virtual video visit via caregility.  Pt consents to telehealth session and is aware of limitations of telehealth visits. pt joins from her parked car, reporting privacy, and counselor from her home office.  Reported Symptoms:  Pt reports feeling less overwhelmed and identifying emotions.  Mental Status Exam: Appearance:  Well Groomed     Behavior: Appropriate  Motor: Normal  Speech/Language:  Normal Rate  Affect: Appropriate  Mood: anxious  Thought process: normal  Thought content:   WNL  Sensory/Perceptual disturbances:   WNL  Orientation: oriented to person, place, time/date, and situation  Attention: Good  Concentration: Good  Memory: WNL  Fund of knowledge:  Good  Insight:   Good  Judgment:  Good  Impulse Control: Good   Risk Assessment: Danger to Self:  No Self-injurious Behavior: No Danger to Others: No Duty to Warn:no Physical Aggression / Violence:No   Subjective: Counselor assessed pt current functioning per pt report.  Processed w/pt mood and coping.   Explored recent stressors and reflected positive of advocating for self.  Discussed positive connections and acknowledging grief.   Pt affect wnl.  Pt reported that she is feeling less overwhelmed and anxious.  Pt reported sick last week w/ strep and had to advocate later in week to be out of work and slept a lot over weekend.  Pt reported that she is feeling better physically.  Pt discussed recent losses for husbands cousin;s family and how aware of her grief and connections w/ others in grieving.  Pt reported that she does have meetup w/ dad's fraternity brothers this weekend.  Pt discussed looking into master's program in nonprofit management and whether able to manage w/ current commitments.   Pt aware  that more to manage but that also something that is for herself.    Interventions: Cognitive Behavioral Therapy, Solution-Oriented/Positive Psychology, and ACT and supportive  Diagnosis:Bipolar I disorder (HCC)  Grief  Plan: Pt to f/u w/ counseling in 1-2 week.   Pt to f/u w/ Dr. Lolly Mustache as scheduled.   Treatment Plan 02/03/23   Client Abilities/Strengths  supports: family- partner and mom, friends  positives: self awareness, expressing feelings, advocating for self. Motivated, hardworking, intelligent, family centered   Client Treatment Preferences  Weekly to biweekly counseling and continue medication management Dr. Lolly Mustache.    Client Statement of Needs  pt  "Need to continue w/ my therapy and compliance w/ my medication management- I don't want to go back to that (when mental health unstable).  to continue to support me- give my safe place continue talk about what I need.  I am working on protecting my peace".  .    Treatment Level  outpatient counseling    Symptoms  death of father 03-24-2021.  Husband dx w/ heart failure 11/2021. Grief and anxiety. History of at least one hypomanic, manic, or mixed mood episode.Marland Kitchen History of chronic or recurrent depression for which the client has taken antidepressant medication and had outpatient treatment,     Goals 1. Begin a healthy grieving process around the loss.   Objective Begin verbalizing feelings associated with the loss. Target Date: 2024-02-03 Frequency: Daily  Progress: 60 Modality: individual  Related Interventions Assist the client in identifying and expressing feelings connected with his/her loss.   2. continue  to use calming skills to assist coping w/ stress and managing mood Objective Maintain positive self care, use of mindfulness and other grounding activities to maintain mood stability. Target Date: 2024-02-03 Frequency: Daily  Progress: 70 Modality: individual  Related Interventions Assist the client in establishing a  routine pattern of daily activities such as sleeping, eating, solitary and social activities, and exercise; use of relaxation and grounding strategies routine and assist in assessing mood.   3. Develop healthy cognitive patterns and beliefs about self and the world that lead to alleviation and help prevent the relapse of mood episodes.   Objective Identify and replace thoughts and behaviors that trigger manic or depressive symptoms. Target Date: 2024-02-03 Frequency: Daily  Progress: 60 Modality: individual  Related Interventions utilize CBT strategies to assist in challenging negative thought patterns and beliefs and ACT strategies to assist in actions, decisions and interactions consistent w/values.   Client participated in Treatment plan development and provided verbal consent.            Forde Radon, [facility]

## 2023-05-26 ENCOUNTER — Ambulatory Visit: Payer: PRIVATE HEALTH INSURANCE | Admitting: Psychology

## 2023-06-02 ENCOUNTER — Ambulatory Visit (INDEPENDENT_AMBULATORY_CARE_PROVIDER_SITE_OTHER): Payer: PRIVATE HEALTH INSURANCE | Admitting: Psychology

## 2023-06-02 DIAGNOSIS — F4321 Adjustment disorder with depressed mood: Secondary | ICD-10-CM

## 2023-06-02 DIAGNOSIS — F319 Bipolar disorder, unspecified: Secondary | ICD-10-CM | POA: Diagnosis not present

## 2023-06-02 NOTE — Progress Notes (Signed)
[facility] Counselor/Therapist Progress Note  Patient ID: Christina [name], MRN: [id number],    Date: 06/02/2023  Time Spent: 12:01pm-12:48pm  Treatment Type: Individual Therapy  Pt is seen for a virtual video visit via caregility.  Pt consents to telehealth session and is aware of limitations of telehealth visits. pt joins from her parked car, reporting privacy, and counselor from her home office.  Reported Symptoms:  Pt reports acknowledging grief.  Pt reports awareness of being more intentional of doing something for self.  Pt reports worry for her daughter.  Mental Status Exam: Appearance:  Well Groomed     Behavior: Appropriate  Motor: Normal  Speech/Language:  Normal Rate  Affect: Appropriate  Mood: anxious  Thought process: normal  Thought content:   WNL  Sensory/Perceptual disturbances:   WNL  Orientation: oriented to person, place, time/date, and situation  Attention: Good  Concentration: Good  Memory: WNL  Fund of knowledge:  Good  Insight:   Good  Judgment:  Good  Impulse Control: Good   Risk Assessment: Danger to Self:  No Self-injurious Behavior: No Danger to Others: No Duty to Warn:no Physical Aggression / Violence:No   Subjective: Counselor assessed pt current functioning per pt report.  Processed w/pt recent positives and stressors.   Explored interaction w/ her dad's fraternity brothers and positive w/ acknowledging and being present w/ grief. Discussed worry for daughter and supporting and reframing related distortions.  Pt affect wnl.  Pt reported that she is feeling better after being sick again last week w/ an infection.  Pt reported that had good dinner w/ dad's fraternity brothers and was meaningful.  Pt reported that is being present w/ grief emotions.  Pt reports need for more intention w/ self care.  Pt reported that looking forward to college tour for son and giving options to not pressure into following grandfather's path.  Pt  discussed hurt to see her daughter express not liking her weight, her struggles w/ eating and feeling unseeen w/ brother w/ disabilities.  Pt discussed thought that hasn't giving her daughter what needed.  Pt recognized that has given what aware and able.  Pt reflected that w/ husband's current health hasn't been present in parenting like was in past.  Pt struggling w/ how to address.   Interventions: Cognitive Behavioral Therapy, Solution-Oriented/Positive Psychology, and ACT and supportive  Diagnosis:Bipolar I disorder (HCC)  Grief  Plan: Pt to f/u w/ counseling in 1-2 week.   Pt to f/u w/ Dr. Lolly Mustache as scheduled.   Treatment Plan 02/03/23   Client Abilities/Strengths  supports: family- partner and mom, friends  positives: self awareness, expressing feelings, advocating for self. Motivated, hardworking, intelligent, family centered   Client Treatment Preferences  Weekly to biweekly counseling and continue medication management Dr. Lolly Mustache.    Client Statement of Needs  pt  "Need to continue w/ my therapy and compliance w/ my medication management- I don't want to go back to that (when mental health unstable).  to continue to support me- give my safe place continue talk about what I need.  I am working on protecting my peace".  .    Treatment Level  outpatient counseling    Symptoms  death of father 2021-03-31.  Husband dx w/ heart failure 11/2021. Grief and anxiety. History of at least one hypomanic, manic, or mixed mood episode.Marland Kitchen History of chronic or recurrent depression for which the client has taken antidepressant medication and had outpatient treatment,     Goals  1. Begin a healthy grieving process around the loss.   Objective Begin verbalizing feelings associated with the loss. Target Date: 2024-02-03 Frequency: Daily  Progress: 60 Modality: individual  Related Interventions Assist the client in identifying and expressing feelings connected with his/her loss.   2. continue to  use calming skills to assist coping w/ stress and managing mood Objective Maintain positive self care, use of mindfulness and other grounding activities to maintain mood stability. Target Date: 2024-02-03 Frequency: Daily  Progress: 70 Modality: individual  Related Interventions Assist the client in establishing a routine pattern of daily activities such as sleeping, eating, solitary and social activities, and exercise; use of relaxation and grounding strategies routine and assist in assessing mood.   3. Develop healthy cognitive patterns and beliefs about self and the world that lead to alleviation and help prevent the relapse of mood episodes.   Objective Identify and replace thoughts and behaviors that trigger manic or depressive symptoms. Target Date: 2024-02-03 Frequency: Daily  Progress: 60 Modality: individual  Related Interventions utilize CBT strategies to assist in challenging negative thought patterns and beliefs and ACT strategies to assist in actions, decisions and interactions consistent w/values.   Client participated in Treatment plan development and provided verbal consent.          Forde Radon, [facility]

## 2023-06-05 ENCOUNTER — Other Ambulatory Visit: Payer: Self-pay | Admitting: Family Medicine

## 2023-06-10 ENCOUNTER — Ambulatory Visit: Payer: PRIVATE HEALTH INSURANCE | Admitting: Psychology

## 2023-06-10 DIAGNOSIS — F319 Bipolar disorder, unspecified: Secondary | ICD-10-CM

## 2023-06-10 DIAGNOSIS — F4321 Adjustment disorder with depressed mood: Secondary | ICD-10-CM

## 2023-06-10 NOTE — Progress Notes (Signed)
[facility] Counselor/Therapist Progress Note  Patient ID: Christina [name], MRN: [id number],    Date: 06/10/2023  Time Spent: 12:00pm-12:45pm  Treatment Type: Individual Therapy  Pt is seen for a virtual video visit via caregility.  Pt consents to telehealth session and is aware of limitations of telehealth visits. pt joins from her parked car, reporting privacy, and counselor from her office.  Reported Symptoms:  Pt reports feeling overwhelmed and acknowledging distortions of expectations.  Mental Status Exam: Appearance:  Well Groomed     Behavior: Appropriate  Motor: Normal  Speech/Language:  Normal Rate  Affect: Appropriate  Mood: anxious  Thought process: normal  Thought content:   WNL  Sensory/Perceptual disturbances:   WNL  Orientation: oriented to person, place, time/date, and situation  Attention: Good  Concentration: Good  Memory: WNL  Fund of knowledge:  Good  Insight:   Good  Judgment:  Good  Impulse Control: Good   Risk Assessment: Danger to Self:  No Self-injurious Behavior: No Danger to Others: No Duty to Warn:no Physical Aggression / Violence:No   Subjective: Counselor assessed pt current functioning per pt report.  Processed w/pt recent feeling overwhelmed w/ being pulled in so many directions w/ work, kids, husband and not taking time for self care. Assisted identifying and reframing distortions and how to plan for self care.   Pt affect congruent w/ report of overwhelmed.  Pt discussed distortions of being strong black woman and being able to do it all and pressures to.  Pt discussed ways of reframing.  Pt also identified self care needs as priority.  Pt identified restart of brain breaks although different from previous job.    Interventions: Cognitive Behavioral Therapy, Solution-Oriented/Positive Psychology, and ACT and supportive  Diagnosis:Bipolar I disorder (HCC)  Grief  Plan: Pt to f/u w/ counseling in 1-2 week.   Pt to f/u w/  Dr. Lolly Mustache as scheduled.   Treatment Plan 02/03/23   Client Abilities/Strengths  supports: family- partner and mom, friends  positives: self awareness, expressing feelings, advocating for self. Motivated, hardworking, intelligent, family centered   Client Treatment Preferences  Weekly to biweekly counseling and continue medication management Dr. Lolly Mustache.    Client Statement of Needs  pt  "Need to continue w/ my therapy and compliance w/ my medication management- I don't want to go back to that (when mental health unstable).  to continue to support me- give my safe place continue talk about what I need.  I am working on protecting my peace".  .    Treatment Level  outpatient counseling    Symptoms  death of father 03/27/21.  Husband dx w/ heart failure 11/2021. Grief and anxiety. History of at least one hypomanic, manic, or mixed mood episode.Marland Kitchen History of chronic or recurrent depression for which the client has taken antidepressant medication and had outpatient treatment,     Goals 1. Begin a healthy grieving process around the loss.   Objective Begin verbalizing feelings associated with the loss. Target Date: 2024-02-03 Frequency: Daily  Progress: 60 Modality: individual  Related Interventions Assist the client in identifying and expressing feelings connected with his/her loss.   2. continue to use calming skills to assist coping w/ stress and managing mood Objective Maintain positive self care, use of mindfulness and other grounding activities to maintain mood stability. Target Date: 2024-02-03 Frequency: Daily  Progress: 70 Modality: individual  Related Interventions Assist the client in establishing a routine pattern of daily activities such as sleeping, eating,  solitary and social activities, and exercise; use of relaxation and grounding strategies routine and assist in assessing mood.   3. Develop healthy cognitive patterns and beliefs about self and the world that lead to  alleviation and help prevent the relapse of mood episodes.   Objective Identify and replace thoughts and behaviors that trigger manic or depressive symptoms. Target Date: 2024-02-03 Frequency: Daily  Progress: 60 Modality: individual  Related Interventions utilize CBT strategies to assist in challenging negative thought patterns and beliefs and ACT strategies to assist in actions, decisions and interactions consistent w/values.   Client participated in Treatment plan development and provided verbal consent.             Forde Radon, [facility]

## 2023-06-16 ENCOUNTER — Ambulatory Visit: Payer: PRIVATE HEALTH INSURANCE | Admitting: Psychology

## 2023-06-16 DIAGNOSIS — F4321 Adjustment disorder with depressed mood: Secondary | ICD-10-CM | POA: Diagnosis not present

## 2023-06-16 DIAGNOSIS — F319 Bipolar disorder, unspecified: Secondary | ICD-10-CM | POA: Diagnosis not present

## 2023-06-16 NOTE — Progress Notes (Signed)
[facility] Counselor/Therapist Progress Note  Patient ID: Christina [name], MRN: [id number],    Date: 06/16/2023  Time Spent: 12:01pm-12:46pm  Treatment Type: Individual Therapy  Pt is seen for a virtual video visit via caregility.  Pt consents to telehealth session and is aware of limitations of telehealth visits. pt joins from her parked car, reporting privacy, and counselor from her office.  Reported Symptoms:  Pt reports identifying ways to reduce obligations.  Pt reports overwhelmed.  Mental Status Exam: Appearance:  Well Groomed     Behavior: Appropriate  Motor: Normal  Speech/Language:  Normal Rate  Affect: Appropriate  Mood: anxious  Thought process: normal  Thought content:   WNL  Sensory/Perceptual disturbances:   WNL  Orientation: oriented to person, place, time/date, and situation  Attention: Good  Concentration: Good  Memory: WNL  Fund of knowledge:  Good  Insight:   Good  Judgment:  Good  Impulse Control: Good   Risk Assessment: Danger to Self:  No Self-injurious Behavior: No Danger to Others: No Duty to Warn:no Physical Aggression / Violence:No   Subjective: Counselor assessed pt current functioning per pt report.  Processed w/pt stressors and positives.  Explored w/ pt steps taking to reduce obligations and giving self permission to.  Discussed husband's update on health and validated worry.  Assisted identifying ways to be present going into holidays.  Pt affect wnl.  Pt reports still overwhelmed.  Pt reported on new dx for husband heart disease and potential of cancer dx. Pt acknowledged worry about and want to not thing about.  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  Pt reports made steps towards brain break at work for self care and needs to follow through w/ taking time for.   Interventions: Cognitive Behavioral Therapy, Solution-Oriented/Positive Psychology, and ACT and  supportive  Diagnosis:Bipolar I disorder (HCC)  Grief  Plan: Pt to f/u w/ counseling in 1-2 week.   Pt to f/u w/ Dr. Lolly Mustache as scheduled.   Treatment Plan 02/03/23   Client Abilities/Strengths  supports: family- partner and mom, friends  positives: self awareness, expressing feelings, advocating for self. Motivated, hardworking, intelligent, family centered   Client Treatment Preferences  Weekly to biweekly counseling and continue medication management Dr. Lolly Mustache.    Client Statement of Needs  pt  "Need to continue w/ my therapy and compliance w/ my medication management- I don't want to go back to that (when mental health unstable).  to continue to support me- give my safe place continue talk about what I need.  I am working on protecting my peace".  .    Treatment Level  outpatient counseling    Symptoms  death of father Apr 01, 2021.  Husband dx w/ heart failure 11/2021. Grief and anxiety. History of at least one hypomanic, manic, or mixed mood episode.Marland Kitchen History of chronic or recurrent depression for which the client has taken antidepressant medication and had outpatient treatment,     Goals 1. Begin a healthy grieving process around the loss.   Objective Begin verbalizing feelings associated with the loss. Target Date: 2024-02-03 Frequency: Daily  Progress: 60 Modality: individual  Related Interventions Assist the client in identifying and expressing feelings connected with his/her loss.   2. continue to use calming skills to assist coping w/ stress and managing mood Objective Maintain positive self care, use of mindfulness and other grounding activities to maintain mood stability. Target Date: 2024-02-03 Frequency: Daily  Progress: 70 Modality: individual  Related Interventions Assist the client in establishing a routine pattern of daily activities such as sleeping, eating, solitary and social activities, and exercise; use of relaxation and grounding strategies routine and  assist in assessing mood.   3. Develop healthy cognitive patterns and beliefs about self and the world that lead to alleviation and help prevent the relapse of mood episodes.   Objective Identify and replace thoughts and behaviors that trigger manic or depressive symptoms. Target Date: 2024-02-03 Frequency: Daily  Progress: 60 Modality: individual  Related Interventions utilize CBT strategies to assist in challenging negative thought patterns and beliefs and ACT strategies to assist in actions, decisions and interactions consistent w/values.   Client participated in Treatment plan development and provided verbal consent.             Forde Radon, [facility]

## 2023-06-22 ENCOUNTER — Ambulatory Visit: Payer: PRIVATE HEALTH INSURANCE | Admitting: Family Medicine

## 2023-06-23 ENCOUNTER — Ambulatory Visit: Payer: PRIVATE HEALTH INSURANCE | Admitting: Psychology

## 2023-06-23 DIAGNOSIS — F319 Bipolar disorder, unspecified: Secondary | ICD-10-CM

## 2023-06-23 DIAGNOSIS — F4321 Adjustment disorder with depressed mood: Secondary | ICD-10-CM | POA: Diagnosis not present

## 2023-06-23 NOTE — Progress Notes (Signed)
[facility] Counselor/Therapist Progress Note  Patient ID: Christina [name], MRN: [id number],    Date: 06/23/2023  Time Spent: 12:02pm-12:47pm  Treatment Type: Individual Therapy  Pt is seen for a virtual video visit via caregility.  Pt consents to telehealth session and is aware of limitations of telehealth visits. pt joins from her parked car, reporting privacy, and counselor from her office.  Reported Symptoms:  Pt express grief emotions.  Pt identified positive w/ setting boundary for self and self care looking forward to.    Mental Status Exam: Appearance:  Well Groomed     Behavior: Appropriate  Motor: Normal  Speech/Language:  Normal Rate  Affect: Appropriate  Mood: anxious and sad  Thought process: normal  Thought content:   WNL  Sensory/Perceptual disturbances:   WNL  Orientation: oriented to person, place, time/date, and situation  Attention: Good  Concentration: Good  Memory: WNL  Fund of knowledge:  Good  Insight:   Good  Judgment:  Good  Impulse Control: Good   Risk Assessment: Danger to Self:  No Self-injurious Behavior: No Danger to Others: No Duty to Warn:no Physical Aggression / Violence:No   Subjective: Counselor assessed pt current functioning per pt report.  Processed w/pt recent dream re: dad and emotions related. Explored w/ pt her values and decisions consistent w/.  Explored positives of boundary set for self and positive outcome of.  Discussed self care moments. Pt affect wnl.  Pt reports she had a dream over weekend w/ dad and her in conflict.  Pt discussed woke w/ strong sense that needed to visit dad and grandparent graves and her legacy to attend to them.  Pt discussed feeling dad would be disappointed that hasn't and pt express value to her of caring for their space. Pt reported that she did inform she couldn't attend board meeting and sent info she needed to report on instead and felt good about and that was able to fully attend to  plans for her daughter's birthday.  Pt discussed positive of sorority event upcoming as self care opportunity.   Interventions: Cognitive Behavioral Therapy, Solution-Oriented/Positive Psychology, and ACT and supportive  Diagnosis:Bipolar I disorder (HCC)  Grief  Plan: Pt to f/u w/ counseling in 1-2 week.   Pt to f/u w/ Dr. Lolly Mustache as scheduled.   Treatment Plan 02/03/23   Client Abilities/Strengths  supports: family- partner and mom, friends  positives: self awareness, expressing feelings, advocating for self. Motivated, hardworking, intelligent, family centered   Client Treatment Preferences  Weekly to biweekly counseling and continue medication management Dr. Lolly Mustache.    Client Statement of Needs  pt  "Need to continue w/ my therapy and compliance w/ my medication management- I don't want to go back to that (when mental health unstable).  to continue to support me- give my safe place continue talk about what I need.  I am working on protecting my peace".  .    Treatment Level  outpatient counseling    Symptoms  death of father 22-Apr-2021.  Husband dx w/ heart failure 11/2021. Grief and anxiety. History of at least one hypomanic, manic, or mixed mood episode.Marland Kitchen History of chronic or recurrent depression for which the client has taken antidepressant medication and had outpatient treatment,     Goals 1. Begin a healthy grieving process around the loss.   Objective Begin verbalizing feelings associated with the loss. Target Date: 2024-02-03 Frequency: Daily  Progress: 60 Modality: individual  Related Interventions Assist the client in  identifying and expressing feelings connected with his/her loss.   2. continue to use calming skills to assist coping w/ stress and managing mood Objective Maintain positive self care, use of mindfulness and other grounding activities to maintain mood stability. Target Date: 2024-02-03 Frequency: Daily  Progress: 70 Modality: individual  Related  Interventions Assist the client in establishing a routine pattern of daily activities such as sleeping, eating, solitary and social activities, and exercise; use of relaxation and grounding strategies routine and assist in assessing mood.   3. Develop healthy cognitive patterns and beliefs about self and the world that lead to alleviation and help prevent the relapse of mood episodes.   Objective Identify and replace thoughts and behaviors that trigger manic or depressive symptoms. Target Date: 2024-02-03 Frequency: Daily  Progress: 60 Modality: individual  Related Interventions utilize CBT strategies to assist in challenging negative thought patterns and beliefs and ACT strategies to assist in actions, decisions and interactions consistent w/values.   Client participated in Treatment plan development and provided verbal consent.             Christina [name], [facility]

## 2023-06-30 ENCOUNTER — Encounter: Payer: Self-pay | Admitting: Family Medicine

## 2023-06-30 ENCOUNTER — Ambulatory Visit: Payer: PRIVATE HEALTH INSURANCE | Admitting: Family Medicine

## 2023-06-30 VITALS — BP 121/84 | HR 93 | Ht 64.0 in | Wt 175.1 lb

## 2023-06-30 DIAGNOSIS — Z13220 Encounter for screening for lipoid disorders: Secondary | ICD-10-CM

## 2023-06-30 DIAGNOSIS — I10 Essential (primary) hypertension: Secondary | ICD-10-CM | POA: Diagnosis not present

## 2023-06-30 DIAGNOSIS — E559 Vitamin D deficiency, unspecified: Secondary | ICD-10-CM | POA: Diagnosis not present

## 2023-06-30 DIAGNOSIS — E1169 Type 2 diabetes mellitus with other specified complication: Secondary | ICD-10-CM | POA: Diagnosis not present

## 2023-06-30 DIAGNOSIS — F3162 Bipolar disorder, current episode mixed, moderate: Secondary | ICD-10-CM

## 2023-06-30 DIAGNOSIS — Z6379 Other stressful life events affecting family and household: Secondary | ICD-10-CM

## 2023-06-30 DIAGNOSIS — R300 Dysuria: Secondary | ICD-10-CM

## 2023-06-30 MED ORDER — ROSUVASTATIN CALCIUM 5 MG PO TABS: ORAL_TABLET | ORAL | 3 refills | Status: DC

## 2023-06-30 NOTE — Patient Instructions (Addendum)
F/u in 4 to 4.5 monhts, call if you need me sooner  Fasting lipid, cmp and eGFR,cBC, tSH and vit D in January ( [facility])  No med changes  I will follow  up on ENT and GYNe visits  Use lamisil twice daily to soles of feet  It is important that you exercise regularly at least 30 minutes 5 times a week. If you develop chest pain, have severe difficulty breathing, or feel very tired, stop exercising immediately and seek medical attention    Thanks for choosing [facility], we consider it a privelige to serve you.

## 2023-07-02 LAB — URINALYSIS
Bilirubin, UA: NEGATIVE
Ketones, UA: NEGATIVE
Leukocytes,UA: NEGATIVE
Nitrite, UA: NEGATIVE
Protein,UA: NEGATIVE
RBC, UA: NEGATIVE
Specific Gravity, UA: 1.02 (ref 1.005–1.030)
Urobilinogen, Ur: 0.2 mg/dL (ref 0.2–1.0)
pH, UA: 6.5 (ref 5.0–7.5)

## 2023-07-09 DIAGNOSIS — E559 Vitamin D deficiency, unspecified: Secondary | ICD-10-CM | POA: Insufficient documentation

## 2023-07-09 DIAGNOSIS — Z13220 Encounter for screening for lipoid disorders: Secondary | ICD-10-CM | POA: Insufficient documentation

## 2023-07-09 NOTE — Assessment & Plan Note (Signed)
Marked improvement   Patient re-educated about  the importance of commitment to a  minimum of 150 minutes of exercise per week as able.  The importance of healthy food choices with portion control discussed, as well as eating regularly and within a 12 hour window most days. The need to choose "clean , green" food 50 to 75% of the time is discussed, as well as to make water the primary drink and set a goal of 64 ounces water daily.       06/30/2023    1:37 PM 03/03/2023    3:29 PM 02/17/2023   10:49 AM  Weight /BMI  Weight 175 lb 1.9 oz  187 lb 0.6 oz  Height 5\' 4"  (1.626 m)  5\' 4"  (1.626 m)  BMI 30.06 kg/m2  32.11 kg/m2     Information is confidential and restricted. Go to Review Flowsheets to unlock data.    Applauded on this and encouraged to continue same

## 2023-07-09 NOTE — Progress Notes (Signed)
Christina [name]     MRN: [id number]      DOB: Mar 23, 1976  Chief Complaint  Patient presents with   Follow-up    Follow up, sciatic nerve pain sugar this morning was 81    HPI Christina [name] is here for follow up and re-evaluation of chronic medical conditions, medication management and review of any available recent lab and radiology data.  Preventive health is updated, specifically  Cancer screening and Immunization.   Has upcoming visits with ENT for recurrent sinusitis  and also with Gyne The PT denies any adverse reactions to current medications since the last visit.  Denies polyuria, polydipsia, blurred vision , or hypoglycemic episodes.    ROS Denies recent fever or chills. Denies sinus pressure, nasal congestion, ear pain or sore throat. Denies chest congestion, productive cough or wheezing. Denies chest pains, palpitations and leg swelling Denies abdominal pain, nausea, vomiting,diarrhea or constipation.   2 day h/o  dysuria, fdeneis requency, hesitancy or incontinence. C/o sciatic pain I past 2 weeks, less severe as before Denies headaches, seizures, numbness, or tingling. Denies uncontrolled depression, anxiety or insomnia. Denies skin break down or rash.   PE  BP 121/84 (BP Location: Right Arm, Patient Position: Sitting, Cuff Size: Large)   Pulse 93   Ht 5\' 4"  (1.626 m)   Wt 175 lb 1.9 oz (79.4 kg)   SpO2 98%   BMI 30.06 kg/m   Patient alert and oriented and in no cardiopulmonary distress.  HEENT: No facial asymmetry, EOMI,     Neck supple .  Chest: Clear to auscultation bilaterally.  CVS: S1, S2 no murmurs, no S3.Regular rate.  ABD: Soft non tender.   Ext: No edema  MS: Adequate ROM spine, shoulders, hips and knees.  Skin: Intact, no ulcerations or rash noted.  Psych: Good eye contact, normal affect. Memory intact not anxious or depressed appearing.  CNS: CN 2-12 intact, power,  normal throughout.no focal deficits noted.   Assessment &  Plan  Essential hypertension, benign Controlled, no change in medication DASH diet and commitment to daily physical activity for a minimum of 30 minutes discussed and encouraged, as a part of hypertension management. The importance of attaining a healthy weight is also discussed.     06/30/2023    1:37 PM 03/03/2023    3:29 PM 02/17/2023   10:49 AM 01/06/2023   10:00 AM 11/19/2022   10:29 AM 11/11/2022   11:35 AM 11/02/2022    2:06 PM  BP/Weight  Systolic BP 121  409   127 115  Diastolic BP 84  86   86 79  Wt. (Lbs) 175.12  187.04 188  206.12 208.2  BMI 30.06 kg/m2  32.11 kg/m2 32.27 kg/m2  35.38 kg/m2 35.19 kg/m2     Information is confidential and restricted. Go to Review Flowsheets to unlock data.       Type 2 diabetes mellitus with other specified complication (HCC) Diabetes ausing hypertension, hyperlliupidemia, arthritis Managed by Endo and well controlled  Christina [name] is reminded of the importance of commitment to daily physical activity for 30 minutes or more, as able and the need to limit carbohydrate intake to 30 to 60 grams per meal to help with blood sugar control.   The need to take medication as prescribed, test blood sugar as directed, and to call between visits if there is a concern that blood sugar is uncontrolled is also discussed.   Christina [name] is reminded of the importance of daily foot exam,  annual eye examination, and good blood sugar, blood pressure and cholesterol control.     Latest Ref Rng & Units 02/18/2023   11:27 AM 11/12/2022    2:21 PM 05/20/2022    9:41 AM 03/13/2022    1:50 PM 12/23/2021   10:10 AM  Diabetic Labs  HbA1c 4.8 - 5.6 % 7.2  8.7    7.1   Micro/Creat Ratio 0 - 29 mg/g creat  40      Chol 100 - 199 mg/dL 87   244     HDL >01 mg/dL 51   78     Calc LDL 0 - 99 mg/dL 22   26     Triglycerides 0 - 149 mg/dL 58   71     Creatinine 0.57 - 1.00 mg/dL 0.27  2.53  6.64  4.03  0.78       06/30/2023    1:37 PM 03/03/2023    3:29 PM 02/17/2023    10:49 AM 01/06/2023   10:00 AM 11/19/2022   10:29 AM 11/11/2022   11:35 AM 11/02/2022    2:06 PM  BP/Weight  Systolic BP 121  474   127 115  Diastolic BP 84  86   86 79  Wt. (Lbs) 175.12  187.04 188  206.12 208.2  BMI 30.06 kg/m2  32.11 kg/m2 32.27 kg/m2  35.38 kg/m2 35.19 kg/m2     Information is confidential and restricted. Go to Review Flowsheets to unlock data.      Latest Ref Rng & Units 12/18/2022   12:00 AM 05/05/2022    2:20 PM  Foot/eye exam completion dates  Eye Exam No Retinopathy No Retinopathy       Foot Form Completion   Done     This result is from an external source.        Morbid obesity (HCC) Marked improvement   Patient re-educated about  the importance of commitment to a  minimum of 150 minutes of exercise per week as able.  The importance of healthy food choices with portion control discussed, as well as eating regularly and within a 12 hour window most days. The need to choose "clean , green" food 50 to 75% of the time is discussed, as well as to make water the primary drink and set a goal of 64 ounces water daily.       06/30/2023    1:37 PM 03/03/2023    3:29 PM 02/17/2023   10:49 AM  Weight /BMI  Weight 175 lb 1.9 oz  187 lb 0.6 oz  Height 5\' 4"  (1.626 m)  5\' 4"  (1.626 m)  BMI 30.06 kg/m2  32.11 kg/m2     Information is confidential and restricted. Go to Review Flowsheets to unlock data.    Applauded on this and encouraged to continue same  Bipolar 1 disorder, mixed, moderate (HCC) Psych manages and doing well on current medications  Stress due to illness of family member Has support from psychotherapy and is doing well   Dysuria 2 day h/o mild dysuria, CCUA done at visit and is NORMAL  Vitamin D deficiency Updated lab needed at/ before next visit.   Lipid screening Hyperlipidemia:Low fat diet discussed and encouraged.   Lipid Panel  Lab Results  Component Value Date   CHOL 87 (L) 02/18/2023   HDL 51 02/18/2023   LDLCALC 22  02/18/2023   TRIG 58 02/18/2023   CHOLHDL 1.7 02/18/2023     Updated lab needed at/ before next visit.

## 2023-07-09 NOTE — Assessment & Plan Note (Signed)
2 day h/o mild dysuria, CCUA done at visit and is NORMAL

## 2023-07-09 NOTE — Assessment & Plan Note (Signed)
Psych manages and doing well on current medications

## 2023-07-09 NOTE — Assessment & Plan Note (Signed)
Has support from psychotherapy and is doing well

## 2023-07-09 NOTE — Assessment & Plan Note (Signed)
Controlled, no change in medication DASH diet and commitment to daily physical activity for a minimum of 30 minutes discussed and encouraged, as a part of hypertension management. The importance of attaining a healthy weight is also discussed.     06/30/2023    1:37 PM 03/03/2023    3:29 PM 02/17/2023   10:49 AM 01/06/2023   10:00 AM 11/19/2022   10:29 AM 11/11/2022   11:35 AM 11/02/2022    2:06 PM  BP/Weight  Systolic BP 121  272   127 115  Diastolic BP 84  86   86 79  Wt. (Lbs) 175.12  187.04 188  206.12 208.2  BMI 30.06 kg/m2  32.11 kg/m2 32.27 kg/m2  35.38 kg/m2 35.19 kg/m2     Information is confidential and restricted. Go to Review Flowsheets to unlock data.

## 2023-07-09 NOTE — Assessment & Plan Note (Signed)
Hyperlipidemia:Low fat diet discussed and encouraged.   Lipid Panel  Lab Results  Component Value Date   CHOL 87 (L) 02/18/2023   HDL 51 02/18/2023   LDLCALC 22 02/18/2023   TRIG 58 02/18/2023   CHOLHDL 1.7 02/18/2023     Updated lab needed at/ before next visit.

## 2023-07-09 NOTE — Assessment & Plan Note (Signed)
Updated lab needed at/ before next visit.   

## 2023-07-09 NOTE — Assessment & Plan Note (Signed)
Diabetes ausing hypertension, hyperlliupidemia, arthritis Managed by Endo and well controlled  Ms. Grein is reminded of the importance of commitment to daily physical activity for 30 minutes or more, as able and the need to limit carbohydrate intake to 30 to 60 grams per meal to help with blood sugar control.   The need to take medication as prescribed, test blood sugar as directed, and to call between visits if there is a concern that blood sugar is uncontrolled is also discussed.   Ms. Sarka is reminded of the importance of daily foot exam, annual eye examination, and good blood sugar, blood pressure and cholesterol control.     Latest Ref Rng & Units 02/18/2023   11:27 AM 11/12/2022    2:21 PM 05/20/2022    9:41 AM 03/13/2022    1:50 PM 12/23/2021   10:10 AM  Diabetic Labs  HbA1c 4.8 - 5.6 % 7.2  8.7    7.1   Micro/Creat Ratio 0 - 29 mg/g creat  40      Chol 100 - 199 mg/dL 87   191     HDL >47 mg/dL 51   78     Calc LDL 0 - 99 mg/dL 22   26     Triglycerides 0 - 149 mg/dL 58   71     Creatinine 0.57 - 1.00 mg/dL 8.29  5.62  1.30  8.65  0.78       06/30/2023    1:37 PM 03/03/2023    3:29 PM 02/17/2023   10:49 AM 01/06/2023   10:00 AM 11/19/2022   10:29 AM 11/11/2022   11:35 AM 11/02/2022    2:06 PM  BP/Weight  Systolic BP 121  784   127 115  Diastolic BP 84  86   86 79  Wt. (Lbs) 175.12  187.04 188  206.12 208.2  BMI 30.06 kg/m2  32.11 kg/m2 32.27 kg/m2  35.38 kg/m2 35.19 kg/m2     Information is confidential and restricted. Go to Review Flowsheets to unlock data.      Latest Ref Rng & Units 12/18/2022   12:00 AM 05/05/2022    2:20 PM  Foot/eye exam completion dates  Eye Exam No Retinopathy No Retinopathy       Foot Form Completion   Done     This result is from an external source.

## 2023-07-13 ENCOUNTER — Ambulatory Visit: Payer: PRIVATE HEALTH INSURANCE | Admitting: Psychology

## 2023-07-13 DIAGNOSIS — F4321 Adjustment disorder with depressed mood: Secondary | ICD-10-CM

## 2023-07-13 DIAGNOSIS — F319 Bipolar disorder, unspecified: Secondary | ICD-10-CM | POA: Diagnosis not present

## 2023-07-13 NOTE — Progress Notes (Signed)
[facility] Counselor/Therapist Progress Note  Patient ID: Christina [name], MRN: [id number],    Date: 07/13/2023  Time Spent: 1:30pm-2:15pm  Treatment Type: Individual Therapy  Pt is seen for a virtual video visit via caregility.  Pt consents to telehealth session and is aware of limitations of telehealth visits. pt joins from her parked car, reporting privacy, and counselor from her home office.  Reported Symptoms:  Pt recognized emotions of grief, overwhelmed and recognizing her need for limits.      Mental Status Exam: Appearance:  Well Groomed     Behavior: Appropriate  Motor: Normal  Speech/Language:  Normal Rate  Affect: Appropriate  Mood: anxious and sad  Thought process: normal  Thought content:   WNL  Sensory/Perceptual disturbances:   WNL  Orientation: oriented to person, place, time/date, and situation  Attention: Good  Concentration: Good  Memory: WNL  Fund of knowledge:  Good  Insight:   Good  Judgment:  Good  Impulse Control: Good   Risk Assessment: Danger to Self:  No Self-injurious Behavior: No Danger to Others: No Duty to Warn:no Physical Aggression / Violence:No   Subjective: Counselor assessed pt current functioning per pt report.  Processed w/pt recent stressors and memories related to losses.  explored w/ pt feeling overwhelmed w/ the commitments between work, kids, family and personal.  Discussed ways to be present and acknowledge emotions.  Pt affect wnl.  Pt reports she has been busy w/ holidays, celebrating birthdays, work, family duties and supporting kids.  Pt discussed grief w/ holidays and ways showing up and memories.  Pt discussed difficulty back to back birthdays and anniversarys between her kids, her parents and herself.  Pt discussed recognizing boundaries she needs to not take on more.    Interventions: Cognitive Behavioral Therapy, Solution-Oriented/Positive Psychology, and ACT and supportive  Diagnosis:Bipolar I disorder  (HCC)  Grief  Plan: Pt to f/u w/ counseling in 1-2 week.   Pt to f/u w/ Dr. Lolly Mustache as scheduled.   Treatment Plan 02/03/23   Client Abilities/Strengths  supports: family- partner and mom, friends  positives: self awareness, expressing feelings, advocating for self. Motivated, hardworking, intelligent, family centered   Client Treatment Preferences  Weekly to biweekly counseling and continue medication management Dr. Lolly Mustache.    Client Statement of Needs  pt  "Need to continue w/ my therapy and compliance w/ my medication management- I don't want to go back to that (when mental health unstable).  to continue to support me- give my safe place continue talk about what I need.  I am working on protecting my peace".  .    Treatment Level  outpatient counseling    Symptoms  death of father 03-20-21.  Husband dx w/ heart failure 11/2021. Grief and anxiety. History of at least one hypomanic, manic, or mixed mood episode.Marland Kitchen History of chronic or recurrent depression for which the client has taken antidepressant medication and had outpatient treatment,     Goals 1. Begin a healthy grieving process around the loss.   Objective Begin verbalizing feelings associated with the loss. Target Date: 2024-02-03 Frequency: Daily  Progress: 60 Modality: individual  Related Interventions Assist the client in identifying and expressing feelings connected with his/her loss.   2. continue to use calming skills to assist coping w/ stress and managing mood Objective Maintain positive self care, use of mindfulness and other grounding activities to maintain mood stability. Target Date: 2024-02-03 Frequency: Daily  Progress: 70 Modality: individual  Related Interventions  Assist the client in establishing a routine pattern of daily activities such as sleeping, eating, solitary and social activities, and exercise; use of relaxation and grounding strategies routine and assist in assessing mood.   3. Develop healthy  cognitive patterns and beliefs about self and the world that lead to alleviation and help prevent the relapse of mood episodes.   Objective Identify and replace thoughts and behaviors that trigger manic or depressive symptoms. Target Date: 2024-02-03 Frequency: Daily  Progress: 60 Modality: individual  Related Interventions utilize CBT strategies to assist in challenging negative thought patterns and beliefs and ACT strategies to assist in actions, decisions and interactions consistent w/values.   Client participated in Treatment plan development and provided verbal consent.             Forde Radon, [facility]

## 2023-07-14 ENCOUNTER — Ambulatory Visit: Payer: PRIVATE HEALTH INSURANCE | Admitting: Psychology

## 2023-07-19 ENCOUNTER — Ambulatory Visit: Payer: PRIVATE HEALTH INSURANCE | Admitting: Psychology

## 2023-08-10 ENCOUNTER — Ambulatory Visit: Payer: PRIVATE HEALTH INSURANCE | Admitting: Psychology

## 2023-08-10 DIAGNOSIS — F4321 Adjustment disorder with depressed mood: Secondary | ICD-10-CM

## 2023-08-10 DIAGNOSIS — F319 Bipolar disorder, unspecified: Secondary | ICD-10-CM | POA: Diagnosis not present

## 2023-08-10 NOTE — Progress Notes (Signed)
 [facility] Counselor/Therapist Progress Note  Patient ID: Christina [name], MRN: [id number],    Date: 08/10/2023  Time Spent: 12:00pm-12:39pm  Treatment Type: Individual Therapy  Pt is seen for a virtual video visit via caregility.  Pt consents to telehealth session and is aware of limitations of telehealth visits. pt joins from her home, reporting privacy, and counselor from her home office.  Reported Symptoms:  Pt expressed emotions of grief, stressed w/ multiple responsibilities.  Pt asserting limits.      Mental Status Exam: Appearance:  Well Groomed     Behavior: Appropriate  Motor: Normal  Speech/Language:  Normal Rate  Affect: Appropriate  Mood: sad and tired  Thought process: normal  Thought content:   WNL  Sensory/Perceptual disturbances:   WNL  Orientation: oriented to person, place, time/date, and situation  Attention: Good  Concentration: Good  Memory: WNL  Fund of knowledge:  Good  Insight:   Good  Judgment:  Good  Impulse Control: Good   Risk Assessment: Danger to Self:  No Self-injurious Behavior: No Danger to Others: No Duty to Warn:no Physical Aggression / Violence:No   Subjective: Counselor assessed pt current functioning per pt report.  Processed w/pt holidays and grief.  Assisted in recognizing and normalizing grief and ways of expressing. Discussed commitments between work, kids, family and personal and how to assert limits and give self permission to set.   Pt affect congruent w/ report of tired.  Pt is getting over cold/virus.  Pt reports that a lot of grief emotions going through holidays, parents anniversary, her anniversary and dad's birthday upcoming.   Pt validating her emotions and recognizing ok to feel and express feelings.  Pt discussed upcoming commitments and how stressed w/ balancing.  Pt seeking time for restoring.  Pt reports that she is setting limits and asserting these.    Interventions: Cognitive Behavioral Therapy,  Solution-Oriented/Positive Psychology, and ACT and supportive  Diagnosis:Bipolar I disorder (HCC)  Grief  Plan: Pt to f/u w/ counseling in 1-2 week.   Pt to f/u w/ Dr. Arfeen as scheduled.   Treatment Plan 02/03/23   Client Abilities/Strengths  supports: family- partner and mom, friends  positives: self awareness, expressing feelings, advocating for self. Motivated, hardworking, intelligent, family centered   Client Treatment Preferences  Weekly to biweekly counseling and continue medication management Dr. Arfeen.    Client Statement of Needs  pt  Need to continue w/ my therapy and compliance w/ my medication management- I don't want to go back to that (when mental health unstable).  to continue to support me- give my safe place continue talk about what I need.  I am working on protecting my peace.  .    Treatment Level  outpatient counseling    Symptoms  death of father 04-04-2021.  Husband dx w/ heart failure 11/2021. Grief and anxiety. History of at least one hypomanic, manic, or mixed mood episode.SABRA History of chronic or recurrent depression for which the client has taken antidepressant medication and had outpatient treatment,     Goals 1. Begin a healthy grieving process around the loss.   Objective Begin verbalizing feelings associated with the loss. Target Date: 2024-02-03 Frequency: Daily  Progress: 60 Modality: individual  Related Interventions Assist the client in identifying and expressing feelings connected with his/her loss.   2. continue to use calming skills to assist coping w/ stress and managing mood Objective Maintain positive self care, use of mindfulness and other grounding activities to  maintain mood stability. Target Date: 2024-02-03 Frequency: Daily  Progress: 70 Modality: individual  Related Interventions Assist the client in establishing a routine pattern of daily activities such as sleeping, eating, solitary and social activities, and exercise; use of  relaxation and grounding strategies routine and assist in assessing mood.   3. Develop healthy cognitive patterns and beliefs about self and the world that lead to alleviation and help prevent the relapse of mood episodes.   Objective Identify and replace thoughts and behaviors that trigger manic or depressive symptoms. Target Date: 2024-02-03 Frequency: Daily  Progress: 60 Modality: individual  Related Interventions utilize CBT strategies to assist in challenging negative thought patterns and beliefs and ACT strategies to assist in actions, decisions and interactions consistent w/values.   Client participated in Treatment plan development and provided verbal consent.            [name] APPL, LCMHC

## 2023-08-17 ENCOUNTER — Ambulatory Visit: Payer: PRIVATE HEALTH INSURANCE | Admitting: Psychology

## 2023-08-24 ENCOUNTER — Encounter: Payer: Self-pay | Admitting: Family Medicine

## 2023-08-24 ENCOUNTER — Ambulatory Visit: Payer: PRIVATE HEALTH INSURANCE | Admitting: Psychology

## 2023-08-24 LAB — CMP14+EGFR
ALT: 9 [IU]/L (ref 0–32)
AST: 11 [IU]/L (ref 0–40)
Albumin: 4.4 g/dL (ref 3.9–4.9)
Alkaline Phosphatase: 59 [IU]/L (ref 44–121)
BUN/Creatinine Ratio: 12 (ref 9–23)
BUN: 10 mg/dL (ref 6–24)
Bilirubin Total: 0.3 mg/dL (ref 0.0–1.2)
CO2: 24 mmol/L (ref 20–29)
Calcium: 9.7 mg/dL (ref 8.7–10.2)
Chloride: 103 mmol/L (ref 96–106)
Creatinine, Ser: 0.81 mg/dL (ref 0.57–1.00)
Globulin, Total: 2.3 g/dL (ref 1.5–4.5)
Glucose: 104 mg/dL — ABNORMAL HIGH (ref 70–99)
Potassium: 4.4 mmol/L (ref 3.5–5.2)
Sodium: 140 mmol/L (ref 134–144)
Total Protein: 6.7 g/dL (ref 6.0–8.5)
eGFR: 90 mL/min/{1.73_m2} (ref >59)

## 2023-08-24 LAB — CBC
Hematocrit: 38 % (ref 34.0–46.6)
Hemoglobin: 11.8 g/dL (ref 11.1–15.9)
MCH: 27.1 pg (ref 26.6–33.0)
MCHC: 31.1 g/dL — ABNORMAL LOW (ref 31.5–35.7)
MCV: 87 fL (ref 79–97)
Platelets: 347 10*3/uL (ref 150–450)
RBC: 4.36 x10E6/uL (ref 3.77–5.28)
RDW: 14.4 % (ref 11.7–15.4)
WBC: 4.2 10*3/uL (ref 3.4–10.8)

## 2023-08-24 LAB — VITAMIN D 25 HYDROXY (VIT D DEFICIENCY, FRACTURES): Vit D, 25-Hydroxy: 35.9 ng/mL (ref 30.0–100.0)

## 2023-08-24 LAB — LIPID PANEL
Chol/HDL Ratio: 1.8 {ratio} (ref 0.0–4.4)
Cholesterol, Total: 143 mg/dL (ref 100–199)
HDL: 81 mg/dL (ref >39)
LDL Chol Calc (NIH): 51 mg/dL (ref 0–99)
Triglycerides: 50 mg/dL (ref 0–149)
VLDL Cholesterol Cal: 11 mg/dL (ref 5–40)

## 2023-08-24 LAB — TSH: TSH: 1.17 u[IU]/mL (ref 0.450–4.500)

## 2023-09-02 ENCOUNTER — Ambulatory Visit: Payer: PRIVATE HEALTH INSURANCE | Admitting: Psychology

## 2023-09-02 ENCOUNTER — Telehealth (HOSPITAL_COMMUNITY): Payer: PRIVATE HEALTH INSURANCE | Admitting: Psychiatry

## 2023-09-02 ENCOUNTER — Encounter (HOSPITAL_COMMUNITY): Payer: Self-pay | Admitting: Psychiatry

## 2023-09-02 DIAGNOSIS — F419 Anxiety disorder, unspecified: Secondary | ICD-10-CM | POA: Diagnosis not present

## 2023-09-02 DIAGNOSIS — F3162 Bipolar disorder, current episode mixed, moderate: Secondary | ICD-10-CM | POA: Diagnosis not present

## 2023-09-02 MED ORDER — ARIPIPRAZOLE 5 MG PO TABS
5.0000 mg | ORAL_TABLET | Freq: Every evening | ORAL | 1 refills | Status: DC
Start: 2023-09-02 — End: 2024-03-01

## 2023-09-02 NOTE — Progress Notes (Signed)
[facility] MD Virtual Progress Note   Patient Location: Home Provider Location: Office  I connect with patient by video and verified that I am speaking with correct person by using two identifiers. I discussed the limitations of evaluation and management by telemedicine and the availability of in person appointments. I also discussed with the patient that there may be a patient responsible charge related to this service. The patient expressed understanding and agreed to proceed.  Christina [name] [id number] 48 y.o.  09/02/2023 8:56 AM  History of Present Illness:  Patient is evaluated by video session.  She is sick today and staying home.  She reported things are going very well.  She is compliant with Abilify but is helping her paranoia, anxiety.  She denies any irritability, anger, mania, psychosis or any hallucination.  She reported husband was sick and could not go to the his doctor's appointment because of sickness.  Patient told husband has CHF and when he is somewhat better to keep the appointment that his daughter got sick.  Patient told Christmas was okay.  It was quite but at least kids were able to see the family members.  She is sleeping okay.  She denies any major panic attack, feeling of hopelessness or worthlessness.  Recently she had a lab work.  Her vitamin D is 35.  She is scheduled to have a hemoglobin A1c with her endocrinologist next week.  She has no tremors, shakes or any EPS.  She wants to continue current medication.  Her job is going well.  Past Psychiatric History: H/O depression, anxiety, mania and psychosis.  Admitted in 1997 at [facility] due to suicidal gestures.  Seen in this office since 2006. Tried Prozac and Wellbutrin.      Outpatient Encounter Medications as of 09/02/2023  Medication Sig   acetaminophen (TYLENOL) 500 MG tablet Take 1,000 mg every 8 (eight) hours as needed by mouth for mild pain.    ARIPiprazole (ABILIFY) 5 MG tablet Take 1 tablet (5 mg  total) by mouth every evening.   azelastine (ASTELIN) 0.1 % nasal spray Place 2 sprays into both nostrils 2 (two) times daily. Use in each nostril as directed   Empagliflozin-metFORMIN HCl ER (SYNJARDY XR) 12.12-998 MG TB24 Take two tablets by mouth once daily for diabetes   fluticasone (FLONASE) 50 MCG/ACT nasal spray Place 2 sprays into both nostrils daily.   levocetirizine (XYZAL) 5 MG tablet Take 5 mg by mouth every evening.   loperamide (IMODIUM A-D) 2 MG tablet Take 1 tablet (2 mg total) by mouth 4 (four) times daily as needed for diarrhea or loose stools.   losartan (COZAAR) 50 MG tablet Take 1 tablet (50 mg total) by mouth daily.   montelukast (SINGULAIR) 10 MG tablet Take 1 tablet (10 mg total) by mouth at bedtime.   MOUNJARO 7.5 MG/0.5ML Pen Inject 7.5 mg into the skin once a week.   omeprazole (PRILOSEC) 40 MG capsule Take 1 capsule (40 mg total) by mouth daily.   ondansetron (ZOFRAN) 4 MG tablet Take 1 tablet (4 mg total) by mouth every 8 (eight) hours as needed for nausea or vomiting.   rosuvastatin (CRESTOR) 5 MG tablet Take one tablet by mouth two times weekly   No facility-administered encounter medications on file as of 09/02/2023.    Recent Results (from the past 2160 hours)  Urinalysis     Status: Abnormal   Collection Time: 06/30/23  2:34 PM  Result Value Ref Range   Specific Gravity,  UA 1.020 1.005 - 1.030   pH, UA 6.5 5.0 - 7.5   Color, UA Yellow Yellow   Appearance Ur Clear Clear   Leukocytes,UA Negative Negative   Protein,UA Negative Negative/Trace   Glucose, UA 2+ (A) Negative   Ketones, UA Negative Negative   RBC, UA Negative Negative   Bilirubin, UA Negative Negative   Urobilinogen, Ur 0.2 0.2 - 1.0 mg/dL   Nitrite, UA Negative Negative  Lipid panel     Status: None   Collection Time: 08/23/23  8:25 AM  Result Value Ref Range   Cholesterol, Total 143 100 - 199 mg/dL   Triglycerides 50 0 - 149 mg/dL   HDL 81 >16 mg/dL   VLDL Cholesterol Cal 11 5 - 40  mg/dL   LDL Chol Calc (NIH) 51 0 - 99 mg/dL   Chol/HDL Ratio 1.8 0.0 - 4.4 ratio    Comment:                                   T. Chol/HDL Ratio                                             Men  Women                               1/2 Avg.Risk  3.4    3.3                                   Avg.Risk  5.0    4.4                                2X Avg.Risk  9.6    7.1                                3X Avg.Risk 23.4   11.0   CMP14+EGFR     Status: Abnormal   Collection Time: 08/23/23  8:25 AM  Result Value Ref Range   Glucose 104 (H) 70 - 99 mg/dL   BUN 10 6 - 24 mg/dL   Creatinine, Ser 1.09 0.57 - 1.00 mg/dL   eGFR 90 >60 AV/WUJ/8.11   BUN/Creatinine Ratio 12 9 - 23   Sodium 140 134 - 144 mmol/L   Potassium 4.4 3.5 - 5.2 mmol/L   Chloride 103 96 - 106 mmol/L   CO2 24 20 - 29 mmol/L   Calcium 9.7 8.7 - 10.2 mg/dL   Total Protein 6.7 6.0 - 8.5 g/dL   Albumin 4.4 3.9 - 4.9 g/dL   Globulin, Total 2.3 1.5 - 4.5 g/dL   Bilirubin Total 0.3 0.0 - 1.2 mg/dL   Alkaline Phosphatase 59 44 - 121 IU/L   AST 11 0 - 40 IU/L   ALT 9 0 - 32 IU/L  CBC     Status: Abnormal   Collection Time: 08/23/23  8:25 AM  Result Value Ref Range   WBC 4.2 3.4 - 10.8 x10E3/uL   RBC 4.36 3.77 - 5.28 x10E6/uL   Hemoglobin 11.8 11.1 - 15.9  g/dL   Hematocrit 16.1 09.6 - 46.6 %   MCV 87 79 - 97 fL   MCH 27.1 26.6 - 33.0 pg   MCHC 31.1 (L) 31.5 - 35.7 g/dL   RDW 04.5 40.9 - 81.1 %   Platelets 347 150 - 450 x10E3/uL  TSH     Status: None   Collection Time: 08/23/23  8:25 AM  Result Value Ref Range   TSH 1.170 0.450 - 4.500 uIU/mL  VITAMIN D 25 Hydroxy (Vit-D Deficiency, Fractures)     Status: None   Collection Time: 08/23/23  8:25 AM  Result Value Ref Range   Vit D, 25-Hydroxy 35.9 30.0 - 100.0 ng/mL    Comment: Vitamin D deficiency has been defined by the Institute of Medicine and an Endocrine Society practice guideline as a level of serum 25-OH vitamin D less than 20 ng/mL (1,2). The Endocrine Society went  on to further define vitamin D insufficiency as a level between 21 and 29 ng/mL (2). 1. IOM (Institute of Medicine). 2010. Dietary reference    intakes for calcium and D. [place]: The    [employer]. 2. Holick MF, Binkley Cameron, Bischoff-Ferrari HA, et al.    Evaluation, treatment, and prevention of vitamin D    deficiency: an Endocrine Society clinical practice    guideline. JCEM. 2011 Jul; 96(7):1911-30.      Psychiatric Specialty Exam: Physical Exam  Review of Systems  Weight 170 lb (77.1 kg), unknown if currently breastfeeding.There is no height or weight on file to calculate BMI.  General Appearance: Casual and sinus issues  Eye Contact:  Good  Speech:  Clear and Coherent  Volume:  Normal  Mood:   tired  Affect:  Appropriate  Thought Process:  Goal Directed  Orientation:  Full (Time, Place, and Person)  Thought Content:  WDL  Suicidal Thoughts:  No  Homicidal Thoughts:  No  Memory:  Immediate;   Good Recent;   Good Remote;   Good  Judgement:  Good  Insight:  Good  Psychomotor Activity:  Normal  Concentration:  Concentration: Good and Attention Span: Good  Recall:  Good  Fund of Knowledge:  Good  Language:  Good  Akathisia:  No  Handed:  Right  AIMS (if indicated):     Assets:  Communication Skills Desire for Improvement Housing Social Support Talents/Skills Transportation  ADL's:  Intact  Cognition:  WNL  Sleep:  ok     Assessment/Plan: Anxiety - Plan: ARIPiprazole (ABILIFY) 5 MG tablet  Bipolar 1 disorder, mixed, moderate (HCC) - Plan: ARIPiprazole (ABILIFY) 5 MG tablet  Patient is stable on current medication.  Reviewed blood work results.  She is going to have a hemoglobin A1c next week.  Continue Abilify 5 mg daily.  She has no tremor or shakes or any EPS.  Recommend to call us back if she is any question or any concern.  Follow-up in 6 months   Follow Up Instructions:     I discussed the assessment and treatment plan with the  patient. The patient was provided an opportunity to ask questions and all were answered. The patient agreed with the plan and demonstrated an understanding of the instructions.   The patient was advised to call back or seek an in-person evaluation if the symptoms worsen or if the condition fails to improve as anticipated.    Collaboration of Care: Other provider involved in patient's care AEB notes are available in epic to review  Patient/Guardian was advised Release  of Information must be obtained prior to any record release in order to collaborate their care with an outside provider. Patient/Guardian was advised if they have not already done so to contact the registration department to sign all necessary forms in order for [place] to release information regarding their care.   Consent: Patient/Guardian gives verbal consent for treatment and assignment of benefits for services provided during this visit. Patient/Guardian expressed understanding and agreed to proceed.     I provided 18 minutes of non face to face time during this encounter.  Note: This document was prepared by [employer] voice dictation technology and any errors that results from this process are unintentional.    Cleotis Nipper, MD 09/02/2023

## 2023-09-08 ENCOUNTER — Ambulatory Visit: Payer: PRIVATE HEALTH INSURANCE | Admitting: Psychology

## 2023-09-08 DIAGNOSIS — F319 Bipolar disorder, unspecified: Secondary | ICD-10-CM | POA: Diagnosis not present

## 2023-09-08 NOTE — Progress Notes (Signed)
 [facility] Counselor/Therapist Progress Note  Patient ID: Christina [name], MRN: [id number],    Date: 09/08/2023  Time Spent: 11:02am-11:46am  Treatment Type: Individual Therapy  Pt is seen for a virtual video visit via caregility.  Pt consents to telehealth session and is aware of limitations of telehealth visits. pt joins from her home, reporting privacy, and counselor from her home office.  Reported Symptoms:  Pt expressed has been overwhelmed and stressed but felt good that able to cope through w/out shutting down.  Mental Status Exam: Appearance:  Well Groomed     Behavior: Appropriate  Motor: Normal  Speech/Language:  Normal Rate  Affect: Appropriate  Mood: Overwhelmed and stressed  Thought process: normal  Thought content:   WNL  Sensory/Perceptual disturbances:   WNL  Orientation: oriented to person, place, time/date, and situation  Attention: Good  Concentration: Good  Memory: WNL  Fund of knowledge:  Good  Insight:   Good  Judgment:  Good  Impulse Control: Good   Risk Assessment: Danger to Self:  No Self-injurious Behavior: No Danger to Others: No Duty to Warn:no Physical Aggression / Violence:No   Subjective: Counselor assessed pt current functioning per pt report.  Processed w/pt stressors and impact on emotions.  Explored w/pt ways of coping through and acknowledging her being intentional about.  Discussed problems facing and communication needed to explored options.   Pt affect congruent wnl.  Pt reports she has faced a lot of stressors in January w/ changes to schedule w/ weather, making through tough days of father's birthday etc, illness for self and others at home and commitments having a lot of needs at this time.  Pt discussed feeling progress made w/ coping skills as in past would shut down and didn't.  Pt discussed grief and how talked w/ mom about.  Current challenge facing is assisting mom into new home, closer to them and limited  options or barriers as don't own land currently residing on.  Pt discussed need for conversations w/ inlaws.      Interventions: Cognitive Behavioral Therapy, Solution-Oriented/Positive Psychology, and ACT and supportive  Diagnosis:Bipolar I disorder (HCC)  Plan: Pt to f/u w/ counseling in 1-2 week.   Pt to f/u w/ Dr. Arfeen as scheduled.   Treatment Plan 02/03/23   Client Abilities/Strengths  supports: family- partner and mom, friends  positives: self awareness, expressing feelings, advocating for self. Motivated, hardworking, intelligent, family centered   Client Treatment Preferences  Weekly to biweekly counseling and continue medication management Dr. Arfeen.    Client Statement of Needs  pt  Need to continue w/ my therapy and compliance w/ my medication management- I don't want to go back to that (when mental health unstable).  to continue to support me- give my safe place continue talk about what I need.  I am working on protecting my peace.  .    Treatment Level  outpatient counseling    Symptoms  death of father 03-13-21.  Husband dx w/ heart failure 11/2021. Grief and anxiety. History of at least one hypomanic, manic, or mixed mood episode.SABRA History of chronic or recurrent depression for which the client has taken antidepressant medication and had outpatient treatment,     Goals 1. Begin a healthy grieving process around the loss.   Objective Begin verbalizing feelings associated with the loss. Target Date: 2024-02-03 Frequency: Daily  Progress: 60 Modality: individual  Related Interventions Assist the client in identifying and expressing feelings  connected with his/her loss.   2. continue to use calming skills to assist coping w/ stress and managing mood Objective Maintain positive self care, use of mindfulness and other grounding activities to maintain mood stability. Target Date: 2024-02-03 Frequency: Daily  Progress: 70 Modality: individual  Related  Interventions Assist the client in establishing a routine pattern of daily activities such as sleeping, eating, solitary and social activities, and exercise; use of relaxation and grounding strategies routine and assist in assessing mood.   3. Develop healthy cognitive patterns and beliefs about self and the world that lead to alleviation and help prevent the relapse of mood episodes.   Objective Identify and replace thoughts and behaviors that trigger manic or depressive symptoms. Target Date: 2024-02-03 Frequency: Daily  Progress: 60 Modality: individual  Related Interventions utilize CBT strategies to assist in challenging negative thought patterns and beliefs and ACT strategies to assist in actions, decisions and interactions consistent w/values.   Client participated in Treatment plan development and provided verbal consent.       [name] APPL, LCMHC

## 2023-09-17 ENCOUNTER — Encounter: Payer: Self-pay | Admitting: Family Medicine

## 2023-09-17 ENCOUNTER — Other Ambulatory Visit: Payer: Self-pay | Admitting: Family Medicine

## 2023-09-17 DIAGNOSIS — R2241 Localized swelling, mass and lump, right lower limb: Secondary | ICD-10-CM

## 2023-09-21 ENCOUNTER — Ambulatory Visit
Admission: RE | Admit: 2023-09-21 | Discharge: 2023-09-21 | Disposition: A | Discharge status: Home / Self Care | Payer: PRIVATE HEALTH INSURANCE | Source: Ambulatory Visit | Attending: Family Medicine | Admitting: Family Medicine

## 2023-09-21 DIAGNOSIS — R2241 Localized swelling, mass and lump, right lower limb: Secondary | ICD-10-CM

## 2023-09-23 ENCOUNTER — Ambulatory Visit: Payer: PRIVATE HEALTH INSURANCE | Admitting: Psychology

## 2023-09-29 ENCOUNTER — Ambulatory Visit: Payer: PRIVATE HEALTH INSURANCE | Admitting: Psychology

## 2023-10-03 ENCOUNTER — Encounter: Payer: Self-pay | Admitting: Family Medicine

## 2023-10-05 NOTE — Progress Notes (Unsigned)
 Referring Provider: Kerri Perches, MD Primary Care Physician:  Kerri Perches, MD Primary GI Physician: Dr. Marletta Lor  No chief complaint on file.   HPI:   Christina [name] is a 48 y.o. female with history of GERD and gastroparesis. EGD and colonoscopy in 2019. No polyps on colonoscopy, due for repeat in 2029. EGD with gastritis, fundic gland polyps, gastric biopsies benign, small bowel biopsies negative for celiac. She is presenting today for medication refills.   Last seen in the office 11/02/22. She was doing well at that time on 40 mg daily.  She was asymptomatic from a gastroparesis standpoint.  Reported very rare nausea and did request a new prescription of Zofran to have on hand as hers had expired.  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 Darrick Penna: Torturous transverse and sigmoid colon, internal hemorrhoids.  Next colonoscopy in 10 years with MAC and color wrap   dermoid tumor  2000   DILITATION & CURRETTAGE/HYSTROSCOPY WITH NOVASURE ABLATION N/A 12/10/2016   Procedure: DILATATION & CURETTAGE/HYSTEROSCOPY WITH NOVASURE ABLATION;   Surgeon: Kirkland Hun, MD;  Location: [facility];  Service: Gynecology;  Laterality: N/A;   ESOPHAGOGASTRODUODENOSCOPY  07/01/2009   ZOX:[id number] esphagus without barrett's/dilation with 16 mm/mild erthyema in the antrum. mild chronic gastritis on path.    ESOPHAGOGASTRODUODENOSCOPY (EGD) WITH PROPOFOL N/A 10/26/2017   Dr. Darrick Penna: Gastritis, gastric polyps.  Biopsies benign.  Small bowel biopsies negative for celiac.  No H. pylori.   HERNIA REPAIR  12/2018   RADIOACTIVE SEED GUIDED EXCISIONAL BREAST BIOPSY Left 03/18/2022   Procedure: LEFT BREAST RADIOACTIVE SEED LOCALIZED EXCISIONAL BIOPSY;  Surgeon: Manus Rudd, MD;  Location:  SURGERY CENTER;  Service: General;  Laterality: Left;   REDUCTION MAMMAPLASTY     1994   TRIGGER FINGER RELEASE Bilateral 06/2015   TUBAL LIGATION Bilateral 03/06/2016   Procedure: BILATERAL TUBAL LIGATION;  Surgeon: Kirkland Hun, MD;  Location: [facility];  Service: Obstetrics;  Laterality: Bilateral;    Current Outpatient Medications  Medication Sig Dispense Refill   acetaminophen (TYLENOL) 500 MG tablet Take 1,000 mg every 8 (eight) hours as needed by mouth for mild pain.      ARIPiprazole (ABILIFY) 5 MG tablet Take 1 tablet (5 mg total) by mouth every evening. 90 tablet 1   azelastine (ASTELIN) 0.1 % nasal spray Place 2 sprays into both nostrils 2 (two) times daily. Use in each nostril as directed 30 mL 12   Empagliflozin-metFORMIN HCl ER (SYNJARDY XR) 12.12-998 MG TB24 Take  two tablets by mouth once daily for diabetes 60 tablet 3   fluticasone (FLONASE) 50 MCG/ACT nasal spray Place 2 sprays into both nostrils daily. 48 mL 5   levocetirizine (XYZAL) 5 MG tablet Take 5 mg by mouth every evening.     loperamide (IMODIUM A-D) 2 MG tablet Take 1 tablet (2 mg total) by mouth 4 (four) times daily as needed for diarrhea or loose stools. 30 tablet 0   losartan (COZAAR) 50 MG tablet Take 1 tablet (50 mg total) by mouth daily. 90  tablet 2   montelukast (SINGULAIR) 10 MG tablet Take 1 tablet (10 mg total) by mouth at bedtime. 90 tablet 2   MOUNJARO 7.5 MG/0.5ML Pen Inject 7.5 mg into the skin once a week.     omeprazole (PRILOSEC) 40 MG capsule Take 1 capsule (40 mg total) by mouth daily. 90 capsule 3   ondansetron (ZOFRAN) 4 MG tablet Take 1 tablet (4 mg total) by mouth every 8 (eight) hours as needed for nausea or vomiting. 30 tablet 1   rosuvastatin (CRESTOR) 5 MG tablet Take one tablet by mouth two times weekly 32 tablet 3   No current facility-administered medications for this visit.    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   Peanut-containing drug products Anaphylaxis 02/08/2013   Augmentin [amoxicillin-pot clavulanate] Nausea And Vomiting 04/07/2013    Family History  Problem Relation Age of Onset   Diabetes Mother    Hypertension Mother    Fibromyalgia Mother    Stroke Mother    Diabetes Father    Hypertension Father    Asthma Father    Heart disease Father    Depression Father    Colon cancer Neg Hx    Breast cancer Neg Hx     Social History   Socioeconomic History   Marital status: Married    Spouse name: Not on file   Number of children: Not on file   Years of education: Not on file   Highest education level: Bachelor's degree (e.g., BA, AB, BS)  Occupational History   Not on file  Tobacco Use   Smoking status: Never   Smokeless tobacco: Never   Tobacco comments:    not everyday  Vaping Use   Vaping status: Never Used  Substance and Sexual Activity   Alcohol use: Yes    Alcohol/week: 0.0 standard drinks of alcohol    Comment: occ   Drug use: No   Sexual activity: Yes    Partners: Male    Birth control/protection: Surgical  Other Topics Concern   Not on file  Social History Narrative   Not on file   Social Drivers of Health   Financial Resource Strain: Low Risk   (11/07/2022)   Overall Financial Resource Strain (CARDIA)    Difficulty of Paying Living Expenses: Not hard at all  Food Insecurity: Low Risk  (09/06/2023)   Received from [facility]   Hunger Vital Sign    Worried About Running Out of Food in the Last Year: Never true    Ran Out of Food in the Last Year: Never true  Transportation Needs: No Transportation Needs (09/06/2023)   Received from [facility]    In the past 12 months, has lack of reliable transportation kept you from medical appointments, meetings, work or from getting things needed for daily living? : No  Physical Activity:  Insufficiently Active (11/07/2022)   Exercise Vital Sign    Days of Exercise per Week: 2 days    Minutes of Exercise per Session: 30 min  Stress: No Stress Concern Present (11/07/2022)   [employer] of Occupational Health - Occupational Stress Questionnaire    Feeling of Stress : Not at all  Social Connections: Socially Integrated (11/07/2022)   Social Connection and Isolation Panel [NHANES]    Frequency of Communication with Friends and Family: More than three times a week    Frequency of Social Gatherings with Friends and Family: More than three times a week    Attends Religious Services: More than 4 times per year    Active Member of [employer] or Organizations: Yes    Attends Engineer, structural: More than 4 times per year    Marital Status: Married    Review of Systems: Gen: Denies fever, chills, anorexia. Denies fatigue, weakness, weight loss.  CV: Denies chest pain, palpitations, syncope, peripheral edema, and claudication. Resp: Denies dyspnea at rest, cough, wheezing, coughing up blood, and pleurisy. GI: Denies vomiting blood, jaundice, and fecal incontinence.   Denies dysphagia or odynophagia. Derm: Denies rash, itching, dry skin Psych: Denies depression, anxiety, memory loss, confusion. No homicidal or suicidal ideation.  Heme: Denies bruising, bleeding, and enlarged  lymph nodes.  Physical Exam: There were no vitals taken for this visit. General:   Alert and oriented. No distress noted. Pleasant and cooperative.  Head:  Normocephalic and atraumatic. Eyes:  Conjuctiva clear without scleral icterus. Heart:  S1, S2 present without murmurs appreciated. Lungs:  Clear to auscultation bilaterally. No wheezes, rales, or rhonchi. No distress.  Abdomen:  +BS, soft, non-tender and non-distended. No rebound or guarding. No HSM or masses noted. Msk:  Symmetrical without gross deformities. Normal posture. Extremities:  Without edema. Neurologic:  Alert and  oriented x4 Psych:  Normal mood and affect.    Assessment:     Plan:  ***   Ermalinda Memos, PA-C [facility] 10/07/2023

## 2023-10-06 ENCOUNTER — Ambulatory Visit: Payer: PRIVATE HEALTH INSURANCE | Admitting: Psychology

## 2023-10-06 DIAGNOSIS — F4321 Adjustment disorder with depressed mood: Secondary | ICD-10-CM | POA: Diagnosis not present

## 2023-10-06 DIAGNOSIS — F319 Bipolar disorder, unspecified: Secondary | ICD-10-CM | POA: Diagnosis not present

## 2023-10-06 NOTE — Progress Notes (Signed)
 [facility] Counselor/Therapist Progress Note  Patient ID: Christina [name], MRN: [id number],    Date: 10/06/2023  Time Spent: 12:05am-12:42am  Treatment Type: Individual Therapy  Pt is seen for a virtual video visit via caregility.  Pt consents to telehealth session and is aware of limitations of telehealth visits. pt joins from her home, reporting privacy, and counselor from her home office.  Reported Symptoms:  Pt reports awareness of some avoidance of emotions w/ increased grief emotions and recent stressors.   Mental Status Exam: Appearance:  Well Groomed     Behavior: Appropriate  Motor: Normal  Speech/Language:  Normal Rate  Affect: Appropriate  Mood: anxious and sad  Thought process: normal  Thought content:   WNL  Sensory/Perceptual disturbances:   WNL  Orientation: oriented to person, place, time/date, and situation  Attention: Good  Concentration: Good  Memory: WNL  Fund of knowledge:  Good  Insight:   Good  Judgment:  Good  Impulse Control: Good   Risk Assessment: Danger to Self:  No Self-injurious Behavior: No Danger to Others: No Duty to Warn:no Physical Aggression / Violence:No   Subjective: Counselor assessed pt current functioning per pt report.  Processed w/pt emotions and some avoidance.  Validated and normalized grief emotions w/ father in law starting hospice care.  Reflected pt awareness of emotions and avoidance and ways of acknowledging and giving some space for emotions.   Pt affect wnl.  Pt reports she is home sick w/ noravirus and therefore plan for shorter appointment.  Pt expressed feelings of grief as father in law's has started hospice care.  Pt reports continued busy w/ work, family, health issues for family.  Pt discussed awareness that easy for self to avoid feeling by through self into work.  Pt discussed how counseling has helped her to increased awareness and cope through w/out shutting down.  Pt recognized that counseling also  is allow for her to not avoid feelings.   Interventions: Cognitive Behavioral Therapy, Solution-Oriented/Positive Psychology, and ACT and supportive  Diagnosis:Bipolar I disorder (HCC)  Grief  Plan: Pt to f/u w/ counseling in 1-2 week.   Pt to f/u w/ Dr. Lolly Mustache as scheduled.   Treatment Plan 02/03/23   Client Abilities/Strengths  supports: family- partner and mom, friends  positives: self awareness, expressing feelings, advocating for self. Motivated, hardworking, intelligent, family centered   Client Treatment Preferences  Weekly to biweekly counseling and continue medication management Dr. Lolly Mustache.    Client Statement of Needs  pt  "Need to continue w/ my therapy and compliance w/ my medication management- I don't want to go back to that (when mental health unstable).  to continue to support me- give my safe place continue talk about what I need.  I am working on protecting my peace".  .    Treatment Level  outpatient counseling    Symptoms  death of father April 11, 2021.  Husband dx w/ heart failure 11/2021. Grief and anxiety. History of at least one hypomanic, manic, or mixed mood episode.Marland Kitchen History of chronic or recurrent depression for which the client has taken antidepressant medication and had outpatient treatment,     Goals 1. Begin a healthy grieving process around the loss.   Objective Begin verbalizing feelings associated with the loss. Target Date: 2024-02-03 Frequency: Daily  Progress: 60 Modality: individual  Related Interventions Assist the client in identifying and expressing feelings connected with his/her loss.   2. continue to use calming skills to assist coping w/  stress and managing mood Objective Maintain positive self care, use of mindfulness and other grounding activities to maintain mood stability. Target Date: 2024-02-03 Frequency: Daily  Progress: 70 Modality: individual  Related Interventions Assist the client in establishing a routine pattern of daily  activities such as sleeping, eating, solitary and social activities, and exercise; use of relaxation and grounding strategies routine and assist in assessing mood.   3. Develop healthy cognitive patterns and beliefs about self and the world that lead to alleviation and help prevent the relapse of mood episodes.   Objective Identify and replace thoughts and behaviors that trigger manic or depressive symptoms. Target Date: 2024-02-03 Frequency: Daily  Progress: 60 Modality: individual  Related Interventions utilize CBT strategies to assist in challenging negative thought patterns and beliefs and ACT strategies to assist in actions, decisions and interactions consistent w/values.   Client participated in Treatment plan development and provided verbal consent.          Forde Radon, [facility]

## 2023-10-07 ENCOUNTER — Encounter: Payer: Self-pay | Admitting: Gastroenterology

## 2023-10-07 ENCOUNTER — Ambulatory Visit: Payer: PRIVATE HEALTH INSURANCE | Admitting: Gastroenterology

## 2023-10-07 ENCOUNTER — Other Ambulatory Visit: Payer: Self-pay

## 2023-10-07 VITALS — BP 119/83 | HR 102 | Temp 98.1°F | Ht 65.0 in | Wt 170.4 lb

## 2023-10-07 DIAGNOSIS — K3184 Gastroparesis: Secondary | ICD-10-CM

## 2023-10-07 DIAGNOSIS — K219 Gastro-esophageal reflux disease without esophagitis: Secondary | ICD-10-CM | POA: Diagnosis not present

## 2023-10-07 DIAGNOSIS — R112 Nausea with vomiting, unspecified: Secondary | ICD-10-CM

## 2023-10-07 MED ORDER — OMEPRAZOLE 40 MG PO CPDR: 40.0000 mg | DELAYED_RELEASE_CAPSULE | Freq: Every day | ORAL | 0 refills | Status: AC

## 2023-10-07 MED ORDER — OMEPRAZOLE 40 MG PO CPDR
40.0000 mg | DELAYED_RELEASE_CAPSULE | Freq: Every day | ORAL | 3 refills | Status: DC
Start: 2023-10-07 — End: 2024-02-02

## 2023-10-07 MED ORDER — LOSARTAN POTASSIUM 50 MG PO TABS: 50.0000 mg | ORAL_TABLET | Freq: Every day | ORAL | 2 refills | Status: DC

## 2023-10-07 NOTE — Patient Instructions (Signed)
 Continue omeprazole 40 mg daily.   I suspect your recent nausea, vomiting, diarrhea is likely secondary to acute viral infection, possibly norovirus.  I am glad you are starting to feel better.  Continue to follow a bland diet and drinking plenty of fluids with electrolytes.  If your symptoms persist, please let me know.  Ermalinda Memos, PA-C [facility]

## 2023-10-26 ENCOUNTER — Ambulatory Visit: Payer: PRIVATE HEALTH INSURANCE | Admitting: Psychology

## 2023-11-02 ENCOUNTER — Ambulatory Visit (INDEPENDENT_AMBULATORY_CARE_PROVIDER_SITE_OTHER): Payer: PRIVATE HEALTH INSURANCE | Admitting: Psychology

## 2023-11-02 DIAGNOSIS — F319 Bipolar disorder, unspecified: Secondary | ICD-10-CM | POA: Diagnosis not present

## 2023-11-02 NOTE — Progress Notes (Signed)
 [facility] Counselor/Therapist Progress Note  Patient ID: Christina [name], MRN: [id number],    Date: 11/02/2023  Time Spent: 12:03am-12:46am  Treatment Type: Individual Therapy  Pt is seen for a virtual video visit via caregility.  Pt consents to telehealth session and is aware of limitations of telehealth visits. pt joins from her parked car, reporting privacy, and counselor from her home office.  Reported Symptoms:  Pt reports anxiety w/ son's elopement.  Pt reports has been setting boundaries and not taking on more to assist w/ stress management.   Mental Status Exam: Appearance:  Well Groomed     Behavior: Appropriate  Motor: Normal  Speech/Language:  Normal Rate  Affect: Appropriate  Mood: anxious  Thought process: normal  Thought content:   WNL  Sensory/Perceptual disturbances:   WNL  Orientation: oriented to person, place, time/date, and situation  Attention: Good  Concentration: Good  Memory: WNL  Fund of knowledge:  Good  Insight:   Good  Judgment:  Good  Impulse Control: Good   Risk Assessment: Danger to Self:  No Self-injurious Behavior: No Danger to Others: No Duty to Warn:no Physical Aggression / Violence:No   Subjective: Counselor assessed pt current functioning per pt report.  Processed w/pt increased anxiety w/ recent stressor.  Validated and normalized increased anxiety given son's elopement.  Discussed coping skill to assist in reducing heightened anxiety.  Reflected positives of reducing commitments and encouraged continued setting boundaries as needs. Pt affect wnl.  Pt reported that yesterday her autistic son eloped from the house, husband had to contact police to help locate.  Pt expressed anxiety felt and how still feels heightened today.  Pt reflected on impact had for and family. Pt discussed being mindful of relaxation practices for self to assist coping.  Pt reports she has been able to say no to taking on extra responsibilities this  past month.    Interventions: Cognitive Behavioral Therapy, Solution-Oriented/Positive Psychology, and ACT and supportive  Diagnosis:Bipolar I disorder (HCC)  Plan: Pt to f/u w/ counseling in 1-2 week.   Pt to f/u w/ Dr. Lolly Mustache as scheduled.   Treatment Plan 02/03/23   Client Abilities/Strengths  supports: family- partner and mom, friends  positives: self awareness, expressing feelings, advocating for self. Motivated, hardworking, intelligent, family centered   Client Treatment Preferences  Weekly to biweekly counseling and continue medication management Dr. Lolly Mustache.    Client Statement of Needs  pt  "Need to continue w/ my therapy and compliance w/ my medication management- I don't want to go back to that (when mental health unstable).  to continue to support me- give my safe place continue talk about what I need.  I am working on protecting my peace".  .    Treatment Level  outpatient counseling    Symptoms  death of father 04-06-21.  Husband dx w/ heart failure 11/2021. Grief and anxiety. History of at least one hypomanic, manic, or mixed mood episode.Marland Kitchen History of chronic or recurrent depression for which the client has taken antidepressant medication and had outpatient treatment,     Goals 1. Begin a healthy grieving process around the loss.   Objective Begin verbalizing feelings associated with the loss. Target Date: 2024-02-03 Frequency: Daily  Progress: 60 Modality: individual  Related Interventions Assist the client in identifying and expressing feelings connected with his/her loss.   2. continue to use calming skills to assist coping w/ stress and managing mood Objective Maintain positive self care, use of mindfulness  and other grounding activities to maintain mood stability. Target Date: 2024-02-03 Frequency: Daily  Progress: 70 Modality: individual  Related Interventions Assist the client in establishing a routine pattern of daily activities such as sleeping, eating,  solitary and social activities, and exercise; use of relaxation and grounding strategies routine and assist in assessing mood.   3. Develop healthy cognitive patterns and beliefs about self and the world that lead to alleviation and help prevent the relapse of mood episodes.   Objective Identify and replace thoughts and behaviors that trigger manic or depressive symptoms. Target Date: 2024-02-03 Frequency: Daily  Progress: 60 Modality: individual  Related Interventions utilize CBT strategies to assist in challenging negative thought patterns and beliefs and ACT strategies to assist in actions, decisions and interactions consistent w/values.   Client participated in Treatment plan development and provided verbal consent.          Forde Radon, [facility]

## 2023-11-09 ENCOUNTER — Ambulatory Visit: Payer: PRIVATE HEALTH INSURANCE | Admitting: Family Medicine

## 2023-11-10 ENCOUNTER — Ambulatory Visit: Payer: PRIVATE HEALTH INSURANCE | Admitting: Psychology

## 2023-11-12 ENCOUNTER — Other Ambulatory Visit (HOSPITAL_COMMUNITY): Payer: Self-pay

## 2023-11-15 ENCOUNTER — Ambulatory Visit: Payer: PRIVATE HEALTH INSURANCE | Admitting: Psychology

## 2023-11-22 ENCOUNTER — Ambulatory Visit (INDEPENDENT_AMBULATORY_CARE_PROVIDER_SITE_OTHER): Payer: PRIVATE HEALTH INSURANCE | Admitting: Psychology

## 2023-11-22 DIAGNOSIS — F4321 Adjustment disorder with depressed mood: Secondary | ICD-10-CM | POA: Diagnosis not present

## 2023-11-22 DIAGNOSIS — F319 Bipolar disorder, unspecified: Secondary | ICD-10-CM

## 2023-11-22 NOTE — Progress Notes (Signed)
 [facility] Counselor/Therapist Progress Note  Patient ID: Christina [name], MRN: [id number],    Date: 11/22/2023  Time Spent: 3:32pm-4:05pm  Treatment Type: Individual Therapy  Pt is seen for a virtual video visit via caregility.  Pt consents to telehealth session and is aware of limitations of telehealth visits. pt joins from her home, reporting privacy, and counselor from her home office.  Reported Symptoms:  Pt reports stress w/ recent illnesses and father in law at end of life.  Mental Status Exam: Appearance:  Well Groomed     Behavior: Appropriate  Motor: Normal  Speech/Language:  Normal Rate  Affect: Appropriate  Mood: anxious  Thought process: normal  Thought content:   WNL  Sensory/Perceptual disturbances:   WNL  Orientation: oriented to person, place, time/date, and situation  Attention: Good  Concentration: Good  Memory: WNL  Fund of knowledge:  Good  Insight:   Good  Judgment:  Good  Impulse Control: Good   Risk Assessment: Danger to Self:  No Self-injurious Behavior: No Danger to Others: No Duty to Warn:no Physical Aggression / Violence:No   Subjective: Counselor assessed pt current functioning per pt report.  Processed w/pt stressors and coping.  Reflected positives w/ intent for coping w/ what's in her control.  Explored positives of recent family time and planning for vacation.  Discussed how grief may be triggered w/ other losses.  Pt affect wnl.  Pt reported that she has been sick recently 2 times- one yesterday.  Pt reports that her husbands father vitals declined and not expected to live the night.  Pt acknowledged how that triggering some grief w/ her experience w/ her father's death.  Pt is focusing on what is in her control for coping.  Pt discussed plans for vacation this summer and plans to join tennis league for one day a week.  Pt's session shortened as her autistic son's needs were barrier to continuing appointment.   Interventions:  Cognitive Behavioral Therapy, Solution-Oriented/Positive Psychology, and ACT and supportive  Diagnosis:Bipolar I disorder (HCC)  Grief  Plan: Pt to f/u w/ counseling in 1-2 week.   Pt to f/u w/ Dr. Arfeen as scheduled.   Treatment Plan 02/03/23   Client Abilities/Strengths  supports: family- partner and mom, friends  positives: self awareness, expressing feelings, advocating for self. Motivated, hardworking, intelligent, family centered   Client Treatment Preferences  Weekly to biweekly counseling and continue medication management Dr. Arfeen.    Client Statement of Needs  pt  "Need to continue w/ my therapy and compliance w/ my medication management- I don't want to go back to that (when mental health unstable).  to continue to support me- give my safe place continue talk about what I need.  I am working on protecting my peace".  .    Treatment Level  outpatient counseling    Symptoms  death of father Mar 28, 2021.  Husband dx w/ heart failure 11/2021. Grief and anxiety. History of at least one hypomanic, manic, or mixed mood episode.Aaron Aas History of chronic or recurrent depression for which the client has taken antidepressant medication and had outpatient treatment,     Goals 1. Begin a healthy grieving process around the loss.   Objective Begin verbalizing feelings associated with the loss. Target Date: 2024-02-03 Frequency: Daily  Progress: 60 Modality: individual  Related Interventions Assist the client in identifying and expressing feelings connected with his/her loss.   2. continue to use calming skills to assist coping w/ stress and managing  mood Objective Maintain positive self care, use of mindfulness and other grounding activities to maintain mood stability. Target Date: 2024-02-03 Frequency: Daily  Progress: 70 Modality: individual  Related Interventions Assist the client in establishing a routine pattern of daily activities such as sleeping, eating, solitary and social  activities, and exercise; use of relaxation and grounding strategies routine and assist in assessing mood.   3. Develop healthy cognitive patterns and beliefs about self and the world that lead to alleviation and help prevent the relapse of mood episodes.   Objective Identify and replace thoughts and behaviors that trigger manic or depressive symptoms. Target Date: 2024-02-03 Frequency: Daily  Progress: 60 Modality: individual  Related Interventions utilize CBT strategies to assist in challenging negative thought patterns and beliefs and ACT strategies to assist in actions, decisions and interactions consistent w/values.   Client participated in Treatment plan development and provided verbal consent.             Clydie Darter, LCMHC

## 2023-12-01 ENCOUNTER — Telehealth (HOSPITAL_COMMUNITY): Payer: PRIVATE HEALTH INSURANCE | Admitting: Psychiatry

## 2023-12-07 ENCOUNTER — Ambulatory Visit: Payer: PRIVATE HEALTH INSURANCE | Admitting: Family Medicine

## 2023-12-08 ENCOUNTER — Ambulatory Visit (INDEPENDENT_AMBULATORY_CARE_PROVIDER_SITE_OTHER): Payer: PRIVATE HEALTH INSURANCE | Admitting: Psychology

## 2023-12-08 DIAGNOSIS — F4321 Adjustment disorder with depressed mood: Secondary | ICD-10-CM

## 2023-12-08 DIAGNOSIS — F319 Bipolar disorder, unspecified: Secondary | ICD-10-CM

## 2023-12-08 NOTE — Progress Notes (Signed)
 [facility] Counselor/Therapist Progress Note  Patient ID: Christina [name], MRN: [id number],    Date: 12/08/2023  Time Spent: 10:00am-10:44am  Treatment Type: Individual Therapy  Pt is seen for a virtual video visit via caregility.  Pt consents to telehealth session and is aware of limitations of telehealth visits. pt joins from her parked car, reporting privacy, and counselor from her home office.  Reported Symptoms:  Pt reports increased grieving/sadness w/ father in laws recent death.  Mental Status Exam: Appearance:  Well Groomed     Behavior: Appropriate  Motor: Normal  Speech/Language:  Normal Rate  Affect: Appropriate  Mood: sad  Thought process: normal  Thought content:   WNL  Sensory/Perceptual disturbances:   WNL  Orientation: oriented to person, place, time/date, and situation  Attention: Good  Concentration: Good  Memory: WNL  Fund of knowledge:  Good  Insight:   Good  Judgment:  Good  Impulse Control: Good   Risk Assessment: Danger to Self:  No Self-injurious Behavior: No Danger to Others: No Duty to Warn:no Physical Aggression / Violence:No   Subjective: Counselor assessed pt current functioning per pt report.  Processed w/pt grief and coping.  Reflected awareness of impact of recent loss on her grief and grieving process for her dad.  Explored ways of expressing and naming emotions and support giving and receiving.   Pt affect wnl.  Pt reports her sadness and grief has been more intense w/ her father in laws death on Dec 17, 2023 and funeral over this past weekend.  Pt reflected on her grief, her husband's grief and children's.  Pt acknowledged ways of giving space for her grief and sadness and not having to "be strong".    Interventions: Cognitive Behavioral Therapy and ACT and supportive  Diagnosis:Bipolar I disorder (HCC)  Grief  Plan: Pt to f/u w/ counseling in 1-2 week.   Pt to f/u w/ Dr. Arfeen as scheduled.   Treatment Plan 02/03/23    Client Abilities/Strengths  supports: family- partner and mom, friends  positives: self awareness, expressing feelings, advocating for self. Motivated, hardworking, intelligent, family centered   Client Treatment Preferences  Weekly to biweekly counseling and continue medication management Dr. Arfeen.    Client Statement of Needs  pt  "Need to continue w/ my therapy and compliance w/ my medication management- I don't want to go back to that (when mental health unstable).  to continue to support me- give my safe place continue talk about what I need.  I am working on protecting my peace".  .    Treatment Level  outpatient counseling    Symptoms  death of father 04-03-21.  Husband dx w/ heart failure 11/2021. Grief and anxiety. History of at least one hypomanic, manic, or mixed mood episode.Aaron Aas History of chronic or recurrent depression for which the client has taken antidepressant medication and had outpatient treatment,     Goals 1. Begin a healthy grieving process around the loss.   Objective Begin verbalizing feelings associated with the loss. Target Date: 2024-02-03 Frequency: Daily  Progress: 60 Modality: individual  Related Interventions Assist the client in identifying and expressing feelings connected with his/her loss.   2. continue to use calming skills to assist coping w/ stress and managing mood Objective Maintain positive self care, use of mindfulness and other grounding activities to maintain mood stability. Target Date: 2024-02-03 Frequency: Daily  Progress: 70 Modality: individual  Related Interventions Assist the client in establishing a routine pattern of daily activities  such as sleeping, eating, solitary and social activities, and exercise; use of relaxation and grounding strategies routine and assist in assessing mood.   3. Develop healthy cognitive patterns and beliefs about self and the world that lead to alleviation and help prevent the relapse of mood  episodes.   Objective Identify and replace thoughts and behaviors that trigger manic or depressive symptoms. Target Date: 2024-02-03 Frequency: Daily  Progress: 60 Modality: individual  Related Interventions utilize CBT strategies to assist in challenging negative thought patterns and beliefs and ACT strategies to assist in actions, decisions and interactions consistent w/values.   Client participated in Treatment plan development and provided verbal consent.            Clydie Darter, LCMHC

## 2023-12-15 ENCOUNTER — Ambulatory Visit: Payer: PRIVATE HEALTH INSURANCE | Admitting: Psychology

## 2023-12-21 ENCOUNTER — Ambulatory Visit (INDEPENDENT_AMBULATORY_CARE_PROVIDER_SITE_OTHER): Payer: PRIVATE HEALTH INSURANCE | Admitting: Psychology

## 2023-12-21 DIAGNOSIS — F4321 Adjustment disorder with depressed mood: Secondary | ICD-10-CM | POA: Diagnosis not present

## 2023-12-21 DIAGNOSIS — F319 Bipolar disorder, unspecified: Secondary | ICD-10-CM

## 2023-12-21 NOTE — Progress Notes (Signed)
 [facility] Counselor/Therapist Progress Note  Patient ID: LAURA CALDAS, MRN: [id number],    Date: 12/21/2023  Time Spent: 12:00pm-12:43pm  Treatment Type: Individual Therapy  Pt is seen for a virtual video visit via caregility.  Pt consents to telehealth session and is aware of limitations of telehealth visits. pt joins from her parked car, reporting privacy, and counselor from her home office.  Reported Symptoms:  Pt reports some worry and stress.  Pt aware of need for her self care time.    Mental Status Exam: Appearance:  Well Groomed     Behavior: Appropriate  Motor: Normal  Speech/Language:  Normal Rate  Affect: Appropriate  Mood: anxious  Thought process: normal  Thought content:   WNL  Sensory/Perceptual disturbances:   WNL  Orientation: oriented to person, place, time/date, and situation  Attention: Good  Concentration: Good  Memory: WNL  Fund of knowledge:  Good  Insight:   Good  Judgment:  Good  Impulse Control: Good   Risk Assessment: Danger to Self:  No Self-injurious Behavior: No Danger to Others: No Duty to Warn:no Physical Aggression / Violence:No   Subjective: Counselor assessed pt current functioning per pt report.  Processed w/pt stressors and related worries.  Reflected awareness of importance of coping w/ self care time for something she enjoys and reiterating prioritizing.  Discussed ways of setting boundaries for self- not taking on more w/out letting something go.    Pt affect wnl.  Pt reports stressors her husband's grief/coping, her own coping,  planning for future and summer and juggling multiple responsibilities.  Pt discussed how recognized that time of doctor appointment was barrier to attending tennis league that joined.  Pt was able to recognize to make attending a priority as some thing for her own mental health.   Pt discussed other opportunities for master degree she has considered and aware that would be more to juggle and  may need to see what cdna let go of if wants to purse.    Interventions: Cognitive Behavioral Therapy and ACT and supportive  Diagnosis:Bipolar I disorder (HCC)  Grief  Plan: Pt to f/u w/ counseling in 1-2 week.   Pt to f/u w/ Dr. Arfeen as scheduled.   Treatment Plan 02/03/23   Client Abilities/Strengths  supports: family- partner and mom, friends  positives: self awareness, expressing feelings, advocating for self. Motivated, hardworking, intelligent, family centered   Client Treatment Preferences  Weekly to biweekly counseling and continue medication management Dr. Arfeen.    Client Statement of Needs  pt  "Need to continue w/ my therapy and compliance w/ my medication management- I don't want to go back to that (when mental health unstable).  to continue to support me- give my safe place continue talk about what I need.  I am working on protecting my peace".  .    Treatment Level  outpatient counseling    Symptoms  death of father 03/15/21.  Husband dx w/ heart failure 11/2021. Grief and anxiety. History of at least one hypomanic, manic, or mixed mood episode.Aaron Aas History of chronic or recurrent depression for which the client has taken antidepressant medication and had outpatient treatment,     Goals 1. Begin a healthy grieving process around the loss.   Objective Begin verbalizing feelings associated with the loss. Target Date: 2024-02-03 Frequency: Daily  Progress: 60 Modality: individual  Related Interventions Assist the client in identifying and expressing feelings connected with his/her loss.   2. continue to  use calming skills to assist coping w/ stress and managing mood Objective Maintain positive self care, use of mindfulness and other grounding activities to maintain mood stability. Target Date: 2024-02-03 Frequency: Daily  Progress: 70 Modality: individual  Related Interventions Assist the client in establishing a routine pattern of daily activities such as  sleeping, eating, solitary and social activities, and exercise; use of relaxation and grounding strategies routine and assist in assessing mood.   3. Develop healthy cognitive patterns and beliefs about self and the world that lead to alleviation and help prevent the relapse of mood episodes.   Objective Identify and replace thoughts and behaviors that trigger manic or depressive symptoms. Target Date: 2024-02-03 Frequency: Daily  Progress: 60 Modality: individual  Related Interventions utilize CBT strategies to assist in challenging negative thought patterns and beliefs and ACT strategies to assist in actions, decisions and interactions consistent w/values.   Client participated in Treatment plan development and provided verbal consent.          Clydie Darter, LCMHC

## 2023-12-22 NOTE — Progress Notes (Signed)
 Referring Provider: Towanda Fret, MD Primary Care Physician:  Towanda Fret, MD Primary GI Physician: Dr. Mordechai April  Chief Complaint  Patient presents with   Abdominal Pain    Lower abdominal pain     HPI:   Christina [name] is a 48 y.o. female  with history of GERD and gastroparesis. EGD and colonoscopy in 2019. No polyps on colonoscopy, due for repeat in 2029. EGD with gastritis, fundic gland polyps, gastric biopsies benign, small bowel biopsies negative for celiac.   She is presenting today with chief complaint of recurrent episodes of nausea, vomiting, diarrhea.  Last seen in the office 10/07/2023.  She had recently suffered a bout of acute onset diarrhea, nausea, vomiting that seemed to resolve within 24 hours.  When I saw her in the office, diarrhea and vomiting had resolved.  Still not feeling well overall, but slowly improving and able to tolerate p.o.  Reported no issues prior to this acute episode.  Suspected she likely suffered acute viral gastroenteritis.  Recommended supportive measures, monitoring, and let me know of any persistent symptoms.  Omeprazole  40 mg was continued for GERD.   Today:  Reports she seems to have repeated occurrences of norovirus since February.  Reports 8 episodes since I saw her in March.  She will have terrible nausea for which she takes Zofran  to prevent vomiting, multiple episodes of watery diarrhea, and fever up to 101F.  Typically with lower abdominal pain, but most recent episode which occurred over the weekend, she had pain primarily in her right abdomen.  Symptoms will last 2 to 3 days at a time, then resolved spontaneously. Has not identified any specific triggers.  Between episodes, she does not have nausea, vomiting, but may feel bloated, distended, and typically does not have a bowel movement.   Feeling well only about 4 days between episodes.   No brbpr or melena.   No known IBD in the family.    Takes Mounjaro  on Saturday  mornings.  Has been on her current dose of Mounjaro  since July 2024.   GERD is well controlled omeprazole  40 mg daily.   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 Nolene Baumgarten: Torturous transverse and sigmoid colon, internal hemorrhoids.  Next colonoscopy in 10 years with MAC and color wrap   dermoid tumor  2000   DILITATION &  CURRETTAGE/HYSTROSCOPY WITH NOVASURE ABLATION N/A 12/10/2016   Procedure: DILATATION & CURETTAGE/HYSTEROSCOPY WITH NOVASURE ABLATION;  Surgeon: Lula Sale, MD;  Location: [facility];  Service: Gynecology;  Laterality: N/A;   ESOPHAGOGASTRODUODENOSCOPY  07/01/2009   UEA:VWUJWJ esphagus without barrett's/dilation with 16 mm/mild erthyema in the antrum. mild chronic gastritis on path.    ESOPHAGOGASTRODUODENOSCOPY (EGD) WITH PROPOFOL  N/A 10/26/2017   Dr. Nolene Baumgarten: Gastritis, gastric polyps.  Biopsies benign.  Small bowel biopsies negative for celiac.  No H. pylori.   HERNIA REPAIR   12/2018   RADIOACTIVE SEED GUIDED EXCISIONAL BREAST BIOPSY Left 03/18/2022   Procedure: LEFT BREAST RADIOACTIVE SEED LOCALIZED EXCISIONAL BIOPSY;  Surgeon: Dareen Ebbing, MD;  Location: [facility];  Service: General;  Laterality: Left;   REDUCTION MAMMAPLASTY     1994   TRIGGER FINGER RELEASE Bilateral 06/2015   TUBAL LIGATION Bilateral 03/06/2016   Procedure: BILATERAL TUBAL LIGATION;  Surgeon: Lula Sale, MD;  Location: [facility];  Service: Obstetrics;  Laterality: Bilateral;    Current Outpatient Medications  Medication Sig Dispense Refill   acetaminophen  (TYLENOL ) 500 MG tablet Take 1,000 mg every 8 (eight) hours as needed by mouth for mild pain.      ARIPiprazole  (ABILIFY ) 5 MG tablet Take 1 tablet (5 mg total) by mouth every evening. 90 tablet 1   azelastine  (ASTELIN ) 0.1 % nasal spray Place 2 sprays into both nostrils 2 (two) times daily. Use in each nostril as directed 30 mL 12   Empagliflozin-metFORMIN  HCl ER (SYNJARDY  XR) 12.12-998 MG TB24 Take two tablets by mouth once daily for diabetes 60 tablet 3   fluticasone  (FLONASE ) 50 MCG/ACT nasal spray Place 2 sprays into both nostrils daily. 48 mL 5   levocetirizine (XYZAL) 5 MG tablet Take 5 mg by mouth every evening.     loperamide  (IMODIUM  A-D) 2 MG tablet Take 1 tablet (2 mg total) by mouth 4 (four) times daily as needed for diarrhea or loose stools. 30 tablet 0   losartan  (COZAAR ) 50 MG tablet Take 1 tablet (50 mg total) by mouth daily. 90 tablet 2   montelukast  (SINGULAIR ) 10 MG tablet Take 1 tablet (10 mg total) by mouth at bedtime. 90 tablet 2   MOUNJARO  7.5 MG/0.5ML Pen Inject 7.5 mg into the skin once a week.     omeprazole  (PRILOSEC ) 40 MG capsule Take 1 capsule (40 mg total) by mouth daily. 90 capsule 3   omeprazole  (PRILOSEC ) 40 MG capsule Take 1 capsule (40 mg total) by mouth daily. 30 capsule 0   ondansetron  (ZOFRAN ) 4 MG tablet Take 1 tablet (4 mg total) by mouth every 8 (eight) hours as  needed for nausea or vomiting. 30 tablet 1   rosuvastatin  (CRESTOR ) 5 MG tablet Take one tablet by mouth two times weekly 32 tablet 3   No current facility-administered medications for this visit.    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   Peanut-containing drug products Anaphylaxis 02/08/2013   Augmentin [amoxicillin-pot clavulanate] Nausea And Vomiting 04/07/2013    Family History  Problem Relation Age of Onset   Diabetes Mother    Hypertension Mother    Fibromyalgia Mother    Stroke Mother    Diabetes Father    Hypertension Father    Asthma Father    Heart disease Father    Depression Father    Colon cancer Neg Hx    Breast  cancer Neg Hx    Inflammatory bowel disease Neg Hx     Social History   Socioeconomic History   Marital status: Married    Spouse name: Not on file   Number of children: Not on file   Years of education: Not on file   Highest education level: Bachelor's degree (e.g., BA, AB, BS)  Occupational History   Not on file  Tobacco Use   Smoking status: Never   Smokeless tobacco: Never   Tobacco comments:    not everyday  Vaping Use   Vaping status: Never Used  Substance and Sexual Activity   Alcohol use: Yes    Alcohol/week: 0.0 standard drinks of alcohol    Comment: occ   Drug use: No   Sexual activity: Yes    Partners: Male    Birth control/protection: Surgical  Other Topics Concern   Not on file  Social History Narrative   Not on file   Social Drivers of Health   Financial Resource Strain: Low Risk  (11/07/2022)   Overall Financial Resource Strain (CARDIA)    Difficulty of Paying Living Expenses: Not hard at all  Food Insecurity: Low Risk  (09/06/2023)   Received from [facility]   Hunger Vital Sign    Worried About Running Out of Food in the Last Year: Never true    Ran Out of Food in the Last Year: Never true   Transportation Needs: No Transportation Needs (09/06/2023)   Received from [facility]    In the past 12 months, has lack of reliable transportation kept you from medical appointments, meetings, work or from getting things needed for daily living? : No  Physical Activity: Insufficiently Active (11/07/2022)   Exercise Vital Sign    Days of Exercise per Week: 2 days    Minutes of Exercise per Session: 30 min  Stress: No Stress Concern Present (11/07/2022)   [employer] of Occupational Health - Occupational Stress Questionnaire    Feeling of Stress : Not at all  Social Connections: Socially Integrated (11/07/2022)   Social Connection and Isolation Panel [NHANES]    Frequency of Communication with Friends and Family: More than three times a week    Frequency of Social Gatherings with Friends and Family: More than three times a week    Attends Religious Services: More than 4 times per year    Active Member of [employer] or Organizations: Yes    Attends Engineer, structural: More than 4 times per year    Marital Status: Married    Review of Systems: Gen: Denies cold or flulike symptoms, presyncope, syncope. CV: Denies chest pain, palpitations. Resp: Denies dyspnea, cough. GI: See HPI Derm: Denies rash. MSK: Denies joint pain Heme: See HPI   Physical Exam: BP 122/82 (BP Location: Right Arm, Patient Position: Sitting, Cuff Size: Normal)   Pulse 90   Temp 98.3 F (36.8 C) (Temporal)   Ht 5\' 3"  (1.6 m)   Wt 177 lb 3.2 oz (80.4 kg)   BMI 31.39 kg/m  General:   Alert and oriented. No distress noted. Pleasant and cooperative.  Head:  Normocephalic and atraumatic. Eyes:  Conjuctiva clear without scleral icterus. Heart:  S1, S2 present without murmurs appreciated. Lungs:  Clear to auscultation bilaterally. No wheezes, rales, or rhonchi. No distress.  Abdomen:  +BS, soft, and non-distended. Mild generalized TTP, greatest in left abdomen and periumbilically. No  rebound or guarding. No HSM or masses noted. Msk:  Symmetrical without  gross deformities. Normal posture. Extremities:  Without edema. Neurologic:  Alert and  oriented x4 Psych:  Normal mood and affect.    Assessment:  48 y.o. female with bipolar disorder, depression, diabetes, HTN, history of GERD, gastroparesis, presenting today for further evaluation of recurrent episodes of nausea, vomiting, diarrhea, abdominal pain that has been since February 2025.  Reports having about 9 episodes so far with symptoms resolving spontaneously in 2 to 3 days.  Reports associated fever up to 101 with each occurrence.  Between episodes, she has bloating, distention, and typically does not have a bowel movement.  Denies BRBPR, melena, weight loss.  Prior EGD and colonoscopy in 2019 with no significant abnormalities.  Small bowel biopsies negative for celiac.  Etiology is unclear.  Symptoms somewhat concerning for recurrent enteritis, possibly IBD.  Less consistent with infectious etiology as she have periods without diarrhea and fever. Could have intermittent partial obstruction. Will arrange labs and imaging for further evaluation.    Plan:  CBC, CMP, CRP, fecal calprotectin CT A/P with oral and IV contrast ASAP Further recommendations to follow.    Shana Daring, PA-C [facility] 12/23/2023

## 2023-12-23 ENCOUNTER — Ambulatory Visit (INDEPENDENT_AMBULATORY_CARE_PROVIDER_SITE_OTHER): Payer: PRIVATE HEALTH INSURANCE | Admitting: Gastroenterology

## 2023-12-23 ENCOUNTER — Encounter: Payer: Self-pay | Admitting: Gastroenterology

## 2023-12-23 ENCOUNTER — Telehealth: Payer: Self-pay | Admitting: *Deleted

## 2023-12-23 VITALS — BP 122/82 | HR 90 | Temp 98.3°F | Ht 63.0 in | Wt 177.2 lb

## 2023-12-23 DIAGNOSIS — R10817 Generalized abdominal tenderness: Secondary | ICD-10-CM

## 2023-12-23 DIAGNOSIS — R112 Nausea with vomiting, unspecified: Secondary | ICD-10-CM

## 2023-12-23 DIAGNOSIS — R197 Diarrhea, unspecified: Secondary | ICD-10-CM | POA: Diagnosis not present

## 2023-12-23 NOTE — Telephone Encounter (Signed)
 LMOVM for Christina [name] with [facility] to return call for PA for CT.

## 2023-12-23 NOTE — Patient Instructions (Signed)
 Please have labs and stool testing completed at [facility].  We will arrange for.a CT of your abdomen and pelvis at [facility].  I will have further recommendations for you following results.  Shana Daring, PA-C [facility]

## 2023-12-27 ENCOUNTER — Ambulatory Visit: Payer: Self-pay | Admitting: Gastroenterology

## 2023-12-28 ENCOUNTER — Other Ambulatory Visit: Payer: Self-pay | Admitting: *Deleted

## 2023-12-28 DIAGNOSIS — D509 Iron deficiency anemia, unspecified: Secondary | ICD-10-CM

## 2023-12-28 LAB — CBC WITH DIFFERENTIAL/PLATELET
Basophils Absolute: 0 10*3/uL (ref 0.0–0.2)
Basos: 1 %
EOS (ABSOLUTE): 0.1 10*3/uL (ref 0.0–0.4)
Eos: 2 %
Hematocrit: 36.8 % (ref 34.0–46.6)
Hemoglobin: 10.9 g/dL — ABNORMAL LOW (ref 11.1–15.9)
Immature Grans (Abs): 0 10*3/uL (ref 0.0–0.1)
Immature Granulocytes: 0 %
Lymphocytes Absolute: 3.2 10*3/uL — ABNORMAL HIGH (ref 0.7–3.1)
Lymphs: 59 %
MCH: 25.8 pg — ABNORMAL LOW (ref 26.6–33.0)
MCHC: 29.6 g/dL — ABNORMAL LOW (ref 31.5–35.7)
MCV: 87 fL (ref 79–97)
Monocytes Absolute: 0.3 10*3/uL (ref 0.1–0.9)
Monocytes: 7 %
Neutrophils Absolute: 1.6 10*3/uL (ref 1.4–7.0)
Neutrophils: 31 %
Platelets: 397 10*3/uL (ref 150–450)
RBC: 4.23 x10E6/uL (ref 3.77–5.28)
RDW: 14.8 % (ref 11.7–15.4)
WBC: 5.2 10*3/uL (ref 3.4–10.8)

## 2023-12-28 LAB — CMP14+EGFR
ALT: 10 IU/L (ref 0–32)
AST: 12 IU/L (ref 0–40)
Albumin: 4.5 g/dL (ref 3.9–4.9)
Alkaline Phosphatase: 67 IU/L (ref 44–121)
BUN/Creatinine Ratio: 12 (ref 9–23)
BUN: 9 mg/dL (ref 6–24)
Bilirubin Total: <0.2 mg/dL (ref 0.0–1.2)
CO2: 22 mmol/L (ref 20–29)
Calcium: 10 mg/dL (ref 8.7–10.2)
Chloride: 101 mmol/L (ref 96–106)
Creatinine, Ser: 0.77 mg/dL (ref 0.57–1.00)
Globulin, Total: 2.3 g/dL (ref 1.5–4.5)
Glucose: 83 mg/dL (ref 70–99)
Potassium: 4.4 mmol/L (ref 3.5–5.2)
Sodium: 139 mmol/L (ref 134–144)
Total Protein: 6.8 g/dL (ref 6.0–8.5)
eGFR: 95 mL/min/{1.73_m2} (ref >59)

## 2023-12-28 LAB — C-REACTIVE PROTEIN: CRP: 2 mg/L (ref 0–10)

## 2023-12-28 LAB — CALPROTECTIN, FECAL

## 2023-12-29 ENCOUNTER — Ambulatory Visit: Payer: PRIVATE HEALTH INSURANCE | Admitting: Family Medicine

## 2023-12-30 ENCOUNTER — Ambulatory Visit: Payer: PRIVATE HEALTH INSURANCE | Admitting: Psychology

## 2023-12-30 ENCOUNTER — Ambulatory Visit (HOSPITAL_COMMUNITY)
Admission: RE | Admit: 2023-12-30 | Discharge: 2023-12-30 | Disposition: A | Discharge status: Home / Self Care | Payer: PRIVATE HEALTH INSURANCE | Source: Ambulatory Visit | Attending: Gastroenterology | Admitting: Gastroenterology

## 2023-12-30 DIAGNOSIS — R112 Nausea with vomiting, unspecified: Secondary | ICD-10-CM | POA: Insufficient documentation

## 2023-12-30 DIAGNOSIS — R10817 Generalized abdominal tenderness: Secondary | ICD-10-CM | POA: Insufficient documentation

## 2023-12-30 DIAGNOSIS — R197 Diarrhea, unspecified: Secondary | ICD-10-CM | POA: Diagnosis present

## 2023-12-30 MED ORDER — IOHEXOL 9 MG/ML PO SOLN
ORAL | Status: AC
  Filled 2023-12-30: qty 1000

## 2023-12-30 MED ORDER — IOHEXOL 300 MG/ML  SOLN
100.0000 mL | Freq: Once | INTRAMUSCULAR | Status: AC | PRN
  Administered 2023-12-30: 100 mL via INTRAVENOUS

## 2023-12-31 ENCOUNTER — Other Ambulatory Visit: Payer: Self-pay | Admitting: *Deleted

## 2023-12-31 DIAGNOSIS — D509 Iron deficiency anemia, unspecified: Secondary | ICD-10-CM

## 2024-01-02 LAB — CALPROTECTIN, FECAL: Calprotectin, Fecal: 47 ug/g (ref 0–120)

## 2024-01-03 ENCOUNTER — Encounter: Payer: Self-pay | Admitting: *Deleted

## 2024-01-03 ENCOUNTER — Telehealth: Payer: Self-pay | Admitting: *Deleted

## 2024-01-03 MED ORDER — PEG 3350-KCL-NA BICARB-NACL 420 G PO SOLR: 4000.0000 mL | Freq: Once | ORAL | 0 refills | Status: AC

## 2024-01-03 NOTE — Telephone Encounter (Signed)
 Christina [name] called back and PA is required. Needed clinicals faxed to [phone]

## 2024-01-03 NOTE — Telephone Encounter (Signed)
 Called for PA [facility] [phone] and had to see if prior Siegfried Dress is needed

## 2024-01-04 NOTE — Telephone Encounter (Signed)
 As she isn't have constipation, I think our regular prep would be fine; however, we can give her a sample box of Linzess 145 mcg to take daily for 4 days prior to starting prep to be sure.

## 2024-01-04 NOTE — Telephone Encounter (Signed)
 PA approved. Auth# [id number], DOS 01/31/2024

## 2024-01-05 ENCOUNTER — Ambulatory Visit (INDEPENDENT_AMBULATORY_CARE_PROVIDER_SITE_OTHER): Payer: PRIVATE HEALTH INSURANCE | Admitting: Psychology

## 2024-01-05 DIAGNOSIS — F4321 Adjustment disorder with depressed mood: Secondary | ICD-10-CM

## 2024-01-05 DIAGNOSIS — F319 Bipolar disorder, unspecified: Secondary | ICD-10-CM

## 2024-01-05 NOTE — Progress Notes (Signed)
 [facility] Counselor/Therapist Progress Note  Patient ID: Christina [name], MRN: [id number],    Date: 01/05/2024  Time Spent: 12:03pm-12:32pm  Treatment Type: Individual Therapy  Pt is seen for a virtual video visit via caregility.  Pt consents to telehealth session and is aware of limitations of telehealth visits. pt joins from her parked car, reporting privacy, and counselor from her home office.  Reported Symptoms:  Pt reports increased anxiety.     Mental Status Exam: Appearance:  Well Groomed     Behavior: Appropriate  Motor: Normal  Speech/Language:  Normal Rate  Affect: Appropriate  Mood: anxious  Thought process: normal  Thought content:   WNL  Sensory/Perceptual disturbances:   WNL  Orientation: oriented to person, place, time/date, and situation  Attention: Good  Concentration: Good  Memory: WNL  Fund of knowledge:  Good  Insight:   Good  Judgment:  Good  Impulse Control: Good   Risk Assessment: Danger to Self:  No Self-injurious Behavior: No Danger to Others: No Duty to Warn:no Physical Aggression / Violence:No   Subjective: Counselor assessed pt current functioning per pt report.  Processed w/pt increased anxiety and contributing factors.  Explored stressors and discussed positive of taking FMLA to assist husband and present for kids.  Discussed maintaining healthy boundaries and not taking on more during this time.  Assisted w/ identifying plan for self to manage anxiety and seeking grounding.   Pt affect wnl.  Pt reports increased anxiety.  Pt is able to acknowledge factors impacting including stress of husband's surgery, continued grieving, her own health, juggling things.  Pt reports she will take FMLA as need to care for husband and son during husband recovery.  Pt acknowledged that needs to maintain boundaries during this time for self.  Pt discussed how aware needs increased self care/grounding/relaxing time.  Pt identifies walks in evenings,  brain breaks at work and tennis as ways to plan for this.   Interventions: Cognitive Behavioral Therapy and ACT and supportive  Diagnosis:Bipolar I disorder (HCC)  Grief  Plan: Pt to f/u w/ counseling in 1-2 week.   Pt to f/u w/ Dr. Arfeen as scheduled.   Treatment Plan 02/03/23   Client Abilities/Strengths  supports: family- partner and mom, friends  positives: self awareness, expressing feelings, advocating for self. Motivated, hardworking, intelligent, family centered   Client Treatment Preferences  Weekly to biweekly counseling and continue medication management Dr. Arfeen.    Client Statement of Needs  pt  "Need to continue w/ my therapy and compliance w/ my medication management- I don't want to go back to that (when mental health unstable).  to continue to support me- give my safe place continue talk about what I need.  I am working on protecting my peace".  .    Treatment Level  outpatient counseling    Symptoms  death of father 2021/04/10.  Husband dx w/ heart failure 11/2021. Grief and anxiety. History of at least one hypomanic, manic, or mixed mood episode.Aaron Aas History of chronic or recurrent depression for which the client has taken antidepressant medication and had outpatient treatment,     Goals 1. Begin a healthy grieving process around the loss.   Objective Begin verbalizing feelings associated with the loss. Target Date: 2024-02-03 Frequency: Daily  Progress: 60 Modality: individual  Related Interventions Assist the client in identifying and expressing feelings connected with his/her loss.   2. continue to use calming skills to assist coping w/ stress and managing mood  Objective Maintain positive self care, use of mindfulness and other grounding activities to maintain mood stability. Target Date: 2024-02-03 Frequency: Daily  Progress: 70 Modality: individual  Related Interventions Assist the client in establishing a routine pattern of daily activities such as  sleeping, eating, solitary and social activities, and exercise; use of relaxation and grounding strategies routine and assist in assessing mood.   3. Develop healthy cognitive patterns and beliefs about self and the world that lead to alleviation and help prevent the relapse of mood episodes.   Objective Identify and replace thoughts and behaviors that trigger manic or depressive symptoms. Target Date: 2024-02-03 Frequency: Daily  Progress: 60 Modality: individual  Related Interventions utilize CBT strategies to assist in challenging negative thought patterns and beliefs and ACT strategies to assist in actions, decisions and interactions consistent w/values.   Client participated in Treatment plan development and provided verbal consent.          Clydie Darter, LCMHC

## 2024-01-13 ENCOUNTER — Ambulatory Visit: Payer: PRIVATE HEALTH INSURANCE | Admitting: Psychology

## 2024-01-17 ENCOUNTER — Ambulatory Visit (INDEPENDENT_AMBULATORY_CARE_PROVIDER_SITE_OTHER): Payer: PRIVATE HEALTH INSURANCE | Admitting: Psychology

## 2024-01-17 DIAGNOSIS — F4321 Adjustment disorder with depressed mood: Secondary | ICD-10-CM

## 2024-01-17 DIAGNOSIS — F319 Bipolar disorder, unspecified: Secondary | ICD-10-CM | POA: Diagnosis not present

## 2024-01-17 NOTE — Progress Notes (Signed)
 [facility] Counselor/Therapist Progress Note  Patient ID: Christina [name], MRN: [id number],    Date: 01/17/2024  Time Spent: 9:00am-9:43am  Treatment Type: Individual Therapy  Pt is seen for a virtual video visit via caregility.  Pt consents to telehealth session and is aware of limitations of telehealth visits. pt joins from her home, reporting privacy, and counselor from her home office.  Reported Symptoms:  Pt reports worry for future/husband.  Mental Status Exam: Appearance:  Well Groomed     Behavior: Appropriate  Motor: Normal  Speech/Language:  Normal Rate  Affect: Appropriate  Mood: anxious  Thought process: normal  Thought content:   WNL  Sensory/Perceptual disturbances:   WNL  Orientation: oriented to person, place, time/date, and situation  Attention: Good  Concentration: Good  Memory: WNL  Fund of knowledge:  Good  Insight:   Good  Judgment:  Good  Impulse Control: Good   Risk Assessment: Danger to Self:  No Self-injurious Behavior: No Danger to Others: No Duty to Warn:no Physical Aggression / Violence:No   Subjective: Counselor assessed pt current functioning per pt report.  Processed w/pt worry and recent stressors.  Discussed FMLA and how to utilize and keep boundaries to not take on more.  Validated and normalized worry w/ recent stressors and loss.  Explored thoughts and distortions and focus on challenging w/ facts. Encouraged self care and movement for seeking grounding.   Pt affect wnl.  Pt reports husband's surgery went well and she is on intermittent FMLA to assist husband/family.  Pt reports father's day was emotional at times and discussed tribute she created.  Pt acknowledged anxiety and worry for husband, his health and concern for losing him.  Pt validated her feelings and ways to be present and seek soothing for self w/ walking.     Interventions: Cognitive Behavioral Therapy and ACT and supportive  Diagnosis:Bipolar I disorder  (HCC)  Grief  Plan: Pt to f/u w/ counseling in 1-2 week.   Pt to f/u w/ Dr. Arfeen as scheduled.   Treatment Plan 02/03/23   Client Abilities/Strengths  supports: family- partner and mom, friends  positives: self awareness, expressing feelings, advocating for self. Motivated, hardworking, intelligent, family centered   Client Treatment Preferences  Weekly to biweekly counseling and continue medication management Dr. Arfeen.    Client Statement of Needs  pt  Need to continue w/ my therapy and compliance w/ my medication management- I don't want to go back to that (when mental health unstable).  to continue to support me- give my safe place continue talk about what I need.  I am working on protecting my peace.  .    Treatment Level  outpatient counseling    Symptoms  death of father Mar 25, 2021.  Husband dx w/ heart failure 11/2021. Grief and anxiety. History of at least one hypomanic, manic, or mixed mood episode.Aaron Aas History of chronic or recurrent depression for which the client has taken antidepressant medication and had outpatient treatment,     Goals 1. Begin a healthy grieving process around the loss.   Objective Begin verbalizing feelings associated with the loss. Target Date: 2024-02-03 Frequency: Daily  Progress: 60 Modality: individual  Related Interventions Assist the client in identifying and expressing feelings connected with his/her loss.   2. continue to use calming skills to assist coping w/ stress and managing mood Objective Maintain positive self care, use of mindfulness and other grounding activities to maintain mood stability. Target Date: 2024-02-03 Frequency: Daily  Progress: 70 Modality: individual  Related Interventions Assist the client in establishing a routine pattern of daily activities such as sleeping, eating, solitary and social activities, and exercise; use of relaxation and grounding strategies routine and assist in assessing mood.   3. Develop healthy  cognitive patterns and beliefs about self and the world that lead to alleviation and help prevent the relapse of mood episodes.   Objective Identify and replace thoughts and behaviors that trigger manic or depressive symptoms. Target Date: 2024-02-03 Frequency: Daily  Progress: 60 Modality: individual  Related Interventions utilize CBT strategies to assist in challenging negative thought patterns and beliefs and ACT strategies to assist in actions, decisions and interactions consistent w/values.   Client participated in Treatment plan development and provided verbal consent.         Clydie Darter, LCMHC

## 2024-01-20 ENCOUNTER — Ambulatory Visit: Payer: PRIVATE HEALTH INSURANCE | Admitting: Family Medicine

## 2024-01-27 ENCOUNTER — Ambulatory Visit: Payer: PRIVATE HEALTH INSURANCE | Admitting: Psychology

## 2024-01-27 DIAGNOSIS — F4321 Adjustment disorder with depressed mood: Secondary | ICD-10-CM

## 2024-01-27 DIAGNOSIS — F319 Bipolar disorder, unspecified: Secondary | ICD-10-CM

## 2024-01-27 NOTE — Progress Notes (Signed)
 [facility] Counselor/Therapist Progress Note  Patient ID: NAELANI LAFRANCE, MRN: [id number],    Date: 01/27/2024  Time Spent: 2:28pm-3:13pm  Treatment Type: Individual Therapy  Pt is seen for a virtual video visit via caregility.  Pt consents to telehealth session and is aware of limitations of telehealth visits. pt joins from her home, reporting privacy, and counselor from her home office.  Reported Symptoms:  Pt reports feeling overwhelmed and worry for husband's health.   Mental Status Exam: Appearance:  Well Groomed     Behavior: Appropriate  Motor: Normal  Speech/Language:  Normal Rate  Affect: Appropriate  Mood: anxious  Thought process: normal  Thought content:   WNL  Sensory/Perceptual disturbances:   WNL  Orientation: oriented to person, place, time/date, and situation  Attention: Good  Concentration: Good  Memory: WNL  Fund of knowledge:  Good  Insight:   Good  Judgment:  Good  Impulse Control: Good   Risk Assessment: Danger to Self:  No Self-injurious Behavior: No Danger to Others: No Duty to Warn:no Physical Aggression / Violence:No   Subjective: Counselor assessed pt current functioning per pt report.  Processed w/pt stressors, anxiety, and overwhelm.  Validate and normalized overwhelm from stressors. Identified expectations going into FMLA and how needs to shift since recognizing not doable. Assisted pt in self compassion response.   Pt affect congruent w/ feeling overwhelmed and anxious.  Pt reports that she has been so busy and not able to get all done on her to do list.  Pt reports she is worried that she wouldn't be able to do things w/out her husband and fears losing him.  Pt is able to acknowledge related distortions.  Pt increased awareness that she is not able to do all she thought going into this leave give the stressors and demands.  Pt identified need to step back further and not push herself.    Interventions: Cognitive Behavioral  Therapy and boundaries and supportive  Diagnosis:Bipolar I disorder (HCC)  Grief  Plan: Pt to f/u w/ counseling in 1-2 week.   Pt to f/u w/ Dr. Arfeen as scheduled.   Treatment Plan 02/03/23   Client Abilities/Strengths  supports: family- partner and mom, friends  positives: self awareness, expressing feelings, advocating for self. Motivated, hardworking, intelligent, family centered   Client Treatment Preferences  Weekly to biweekly counseling and continue medication management Dr. Arfeen.    Client Statement of Needs  pt  Need to continue w/ my therapy and compliance w/ my medication management- I don't want to go back to that (when mental health unstable).  to continue to support me- give my safe place continue talk about what I need.  I am working on protecting my peace.  .    Treatment Level  outpatient counseling    Symptoms  death of father 04-06-2021.  Husband dx w/ heart failure 11/2021. Grief and anxiety. History of at least one hypomanic, manic, or mixed mood episode.SABRA History of chronic or recurrent depression for which the client has taken antidepressant medication and had outpatient treatment,     Goals 1. Begin a healthy grieving process around the loss.   Objective Begin verbalizing feelings associated with the loss. Target Date: 2024-02-03 Frequency: Daily  Progress: 60 Modality: individual  Related Interventions Assist the client in identifying and expressing feelings connected with his/her loss.   2. continue to use calming skills to assist coping w/ stress and managing mood Objective Maintain positive self care, use of mindfulness  and other grounding activities to maintain mood stability. Target Date: 2024-02-03 Frequency: Daily  Progress: 70 Modality: individual  Related Interventions Assist the client in establishing a routine pattern of daily activities such as sleeping, eating, solitary and social activities, and exercise; use of relaxation and grounding  strategies routine and assist in assessing mood.   3. Develop healthy cognitive patterns and beliefs about self and the world that lead to alleviation and help prevent the relapse of mood episodes.   Objective Identify and replace thoughts and behaviors that trigger manic or depressive symptoms. Target Date: 2024-02-03 Frequency: Daily  Progress: 60 Modality: individual  Related Interventions utilize CBT strategies to assist in challenging negative thought patterns and beliefs and ACT strategies to assist in actions, decisions and interactions consistent w/values.   Client participated in Treatment plan development and provided verbal consent.         Clifford Coudriet, [facility]          [place], LCMHC

## 2024-01-31 ENCOUNTER — Ambulatory Visit (HOSPITAL_COMMUNITY)
Admission: RE | Admit: 2024-01-31 | Discharge: 2024-01-31 | Disposition: A | Discharge status: Home / Self Care | Payer: PRIVATE HEALTH INSURANCE | Attending: Internal Medicine | Admitting: Internal Medicine

## 2024-01-31 ENCOUNTER — Other Ambulatory Visit: Payer: Self-pay

## 2024-01-31 ENCOUNTER — Ambulatory Visit (HOSPITAL_COMMUNITY): Payer: PRIVATE HEALTH INSURANCE

## 2024-01-31 ENCOUNTER — Encounter (HOSPITAL_COMMUNITY)
Admission: RE | Disposition: A | Discharge status: Home / Self Care | Payer: Self-pay | Source: Home / Self Care | Attending: Internal Medicine

## 2024-01-31 ENCOUNTER — Ambulatory Visit (HOSPITAL_BASED_OUTPATIENT_CLINIC_OR_DEPARTMENT_OTHER): Payer: PRIVATE HEALTH INSURANCE

## 2024-01-31 ENCOUNTER — Encounter (HOSPITAL_COMMUNITY): Payer: Self-pay | Admitting: Internal Medicine

## 2024-01-31 DIAGNOSIS — K317 Polyp of stomach and duodenum: Secondary | ICD-10-CM

## 2024-01-31 DIAGNOSIS — K529 Noninfective gastroenteritis and colitis, unspecified: Secondary | ICD-10-CM | POA: Insufficient documentation

## 2024-01-31 DIAGNOSIS — K2970 Gastritis, unspecified, without bleeding: Secondary | ICD-10-CM

## 2024-01-31 DIAGNOSIS — K319 Disease of stomach and duodenum, unspecified: Secondary | ICD-10-CM | POA: Diagnosis not present

## 2024-01-31 DIAGNOSIS — K2950 Unspecified chronic gastritis without bleeding: Secondary | ICD-10-CM | POA: Diagnosis not present

## 2024-01-31 DIAGNOSIS — K648 Other hemorrhoids: Secondary | ICD-10-CM | POA: Diagnosis not present

## 2024-01-31 DIAGNOSIS — E1143 Type 2 diabetes mellitus with diabetic autonomic (poly)neuropathy: Secondary | ICD-10-CM | POA: Insufficient documentation

## 2024-01-31 DIAGNOSIS — I10 Essential (primary) hypertension: Secondary | ICD-10-CM | POA: Insufficient documentation

## 2024-01-31 DIAGNOSIS — F319 Bipolar disorder, unspecified: Secondary | ICD-10-CM | POA: Diagnosis not present

## 2024-01-31 DIAGNOSIS — K3184 Gastroparesis: Secondary | ICD-10-CM | POA: Insufficient documentation

## 2024-01-31 DIAGNOSIS — Q438 Other specified congenital malformations of intestine: Secondary | ICD-10-CM | POA: Diagnosis not present

## 2024-01-31 LAB — GLUCOSE, CAPILLARY
Glucose-Capillary: 62 mg/dL — ABNORMAL LOW (ref 70–99)
Glucose-Capillary: 97 mg/dL (ref 70–99)

## 2024-01-31 LAB — POCT PREGNANCY, URINE: Preg Test, Ur: NEGATIVE

## 2024-01-31 SURGERY — COLONOSCOPY
Anesthesia: General

## 2024-01-31 MED ORDER — PROPOFOL 500 MG/50ML IV EMUL
INTRAVENOUS | Status: DC | PRN
  Administered 2024-01-31: 150 ug/kg/min via INTRAVENOUS

## 2024-01-31 MED ORDER — LIDOCAINE 2% (20 MG/ML) 5 ML SYRINGE
INTRAMUSCULAR | Status: DC | PRN
Start: 2024-01-31 — End: 2024-01-31
  Administered 2024-01-31: 100 mg via INTRAVENOUS

## 2024-01-31 MED ORDER — LACTATED RINGERS IV SOLN: INTRAVENOUS | Status: DC

## 2024-01-31 MED ORDER — DEXTROSE 50 % IV SOLN
25.0000 mL | Freq: Once | INTRAVENOUS | Status: AC
  Administered 2024-01-31: 25 mL via INTRAVENOUS

## 2024-01-31 MED ORDER — DEXTROSE 50 % IV SOLN
INTRAVENOUS | Status: AC
  Filled 2024-01-31: qty 50

## 2024-01-31 MED ORDER — PROPOFOL 10 MG/ML IV BOLUS
INTRAVENOUS | Status: DC | PRN
  Administered 2024-01-31: 100 mg via INTRAVENOUS

## 2024-01-31 MED ORDER — LIDOCAINE 2% (20 MG/ML) 5 ML SYRINGE
INTRAMUSCULAR | Status: AC
Start: 2024-01-31 — End: 2024-01-31
  Filled 2024-01-31: qty 5

## 2024-01-31 NOTE — Anesthesia Postprocedure Evaluation (Signed)
 Anesthesia Post Note  Patient: Christina [name]  Procedure(s) Performed: COLONOSCOPY EGD (ESOPHAGOGASTRODUODENOSCOPY)  Patient location during evaluation: PACU Anesthesia Type: General Level of consciousness: awake and alert Pain management: pain level controlled Vital Signs Assessment: post-procedure vital signs reviewed and stable Respiratory status: spontaneous breathing, nonlabored ventilation, respiratory function stable and patient connected to nasal cannula oxygen Cardiovascular status: stable and blood pressure returned to baseline Postop Assessment: no apparent nausea or vomiting Anesthetic complications: no  No notable events documented.   Last Vitals:  Vitals:   01/31/24 1120 01/31/24 1319  BP: 131/88 118/81  Pulse: 80 83  Resp: 12 16  Temp: 36.8 C 36.5 C  SpO2: 100% 100%    Last Pain:  Vitals:   01/31/24 1319  TempSrc: Oral  PainSc: 0-No pain                 Andrea Limes

## 2024-01-31 NOTE — Op Note (Signed)
 [facility] Patient Name: Christina [name] Procedure Date: 01/31/2024 12:46 PM MRN: [id number] Date of Birth: 25-Mar-1976 Attending MD: Carlin POUR. Cindie , [place], [id number] CSN: [id number] Age: 48 Admit Type: Outpatient Procedure:                Colonoscopy Indications:              Chronic diarrhea Providers:                Carlin POUR. Cindie, DO, [address] [place]                            Goins, Kristine L. Shirlean Balm, Technician Referring MD:              Medicines:                See the Anesthesia note for documentation of the                            administered medications Complications:            No immediate complications. Estimated Blood Loss:     Estimated blood loss was minimal. Procedure:                Pre-Anesthesia Assessment:                           - The anesthesia plan was to use monitored                            anesthesia care (MAC).                           After obtaining informed consent, the colonoscope                            was passed under direct vision. Throughout the                            procedure, the patient's blood pressure, pulse, and                            oxygen saturations were monitored continuously. The                            [id number]-HQ190L ([id number]) scope was introduced through                            the anus and advanced to the the terminal ileum,                            with identification of the appendiceal orifice and                            IC valve. The colonoscopy was technically difficult                            and complex due to  a redundant colon, significant                            looping and a tortuous colon. Successful completion                            of the procedure was aided by applying abdominal                            pressure. The patient tolerated the procedure well.                            The quality of the bowel preparation was evaluated                             using the BBPS [facility] Bowel Preparation Scale)                            with scores of: Right Colon = 3, Transverse Colon =                            3 and Left Colon = 3 (entire mucosa seen well with                            no residual staining, small fragments of stool or                            opaque liquid). The total BBPS score equals 9. Scope In: 12:48:45 PM Scope Out: 1:13:43 PM Scope Withdrawal Time: 0 hours 7 minutes 24 seconds  Total Procedure Duration: 0 hours 24 minutes 58 seconds  Findings:      Non-bleeding internal hemorrhoids were found.      The terminal ileum appeared normal.      The colon (entire examined portion) was significantly redundant.       Advancing the scope required applying abdominal pressure.      Biopsies for histology were taken with a cold forceps from the ascending       colon, transverse colon and descending colon for evaluation of       microscopic colitis.      The exam was otherwise without abnormality. Impression:               - Non-bleeding internal hemorrhoids.                           - The examined portion of the ileum was normal.                           - Redundant colon.                           - The examination was otherwise normal.                           - Biopsies were taken with a cold forceps from the  ascending colon, transverse colon and descending                            colon for evaluation of microscopic colitis. Moderate Sedation:      Per Anesthesia Care Recommendation:           - Patient has a contact number available for                            emergencies. The signs and symptoms of potential                            delayed complications were discussed with the                            patient. Return to normal activities tomorrow.                            Written discharge instructions were provided to the                            patient.                            - Resume previous diet.                           - Continue present medications.                           - Await pathology results.                           - Repeat colonoscopy in 10 years for screening                            purposes.                           - Return to GI clinic in 8 weeks. Procedure Code(s):        --- Professional ---                           [id number], Colonoscopy, flexible; with biopsy, single                            or multiple Diagnosis Code(s):        --- Professional ---                           K64.8, Other hemorrhoids                           K52.9, Noninfective gastroenteritis and colitis,                            unspecified  Q43.8, Other specified congenital malformations of                            intestine CPT copyright 2022 [employer]. All rights reserved. The codes documented in this report are preliminary and upon coder review may  be revised to meet current compliance requirements. Carlin POUR. Cindie, DO Carlin POUR. Cindie, DO 01/31/2024 1:17:18 PM This report has been signed electronically. Number of Addenda: 0

## 2024-01-31 NOTE — H&P (Signed)
 Primary Care Physician:  Antonetta Rollene BRAVO, MD Primary Gastroenterologist:  Dr. Cindie  Pre-Procedure History & Physical: HPI:  Christina [name] is a 48 y.o. female is here for an EGD and colonoscopy to be performed for nausea, vomiting, diarrhea.  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   GERD (gastroesophageal reflux disease)    H/O acute sinusitis 10/2016   Hypertension    Thyroid  enlargement    Wears glasses     Past Surgical History:  Procedure Laterality Date   BREAST BIOPSY Left 02/09/2022   BREAST BIOPSY Left 02/13/2022   BREAST EXCISIONAL BIOPSY Left 03/18/2022   BREAST REDUCTION SURGERY  1994   BREAST SURGERY N/A    Phreesia 08/26/2020   CESAREAN SECTION N/A 03/06/2016   Procedure: CESAREAN SECTION;  Surgeon: Rome Rigg, MD;  Location: [facility];  Service: Obstetrics;  Laterality: N/A;   COLONOSCOPY WITH PROPOFOL  N/A 10/26/2017   Dr. harvey: Torturous transverse and sigmoid colon, internal hemorrhoids.  Next colonoscopy in 10 years with MAC and color wrap   dermoid tumor  2000   DILITATION & CURRETTAGE/HYSTROSCOPY WITH NOVASURE ABLATION N/A 12/10/2016   Procedure: DILATATION & CURETTAGE/HYSTEROSCOPY WITH NOVASURE ABLATION;  Surgeon: Rigg Rome, MD;  Location: [facility];  Service: Gynecology;  Laterality: N/A;   ESOPHAGOGASTRODUODENOSCOPY  07/01/2009   DOQ:wnmfjo esphagus without barrett's/dilation with 16 mm/mild erthyema in the antrum. mild chronic gastritis on path.    ESOPHAGOGASTRODUODENOSCOPY (EGD) WITH PROPOFOL  N/A 10/26/2017   Dr. harvey: Gastritis, gastric polyps.  Biopsies benign.  Small bowel biopsies negative for celiac.  No H. pylori.   HERNIA REPAIR  12/2018   RADIOACTIVE  SEED GUIDED EXCISIONAL BREAST BIOPSY Left 03/18/2022   Procedure: LEFT BREAST RADIOACTIVE SEED LOCALIZED EXCISIONAL BIOPSY;  Surgeon: Belinda Cough, MD;  Location: [facility];  Service: General;  Laterality: Left;   REDUCTION MAMMAPLASTY     1994   TRIGGER FINGER RELEASE Bilateral 06/2015   TUBAL LIGATION Bilateral 03/06/2016   Procedure: BILATERAL TUBAL LIGATION;  Surgeon: Rome Rigg, MD;  Location: [facility];  Service: Obstetrics;  Laterality: Bilateral;    Prior to Admission medications   Medication Sig Start Date End Date Taking? Authorizing Provider  acetaminophen  (TYLENOL ) 500 MG tablet Take 1,000 mg every 8 (eight) hours as needed by mouth for mild pain.    Yes [provider]  ARIPiprazole  (ABILIFY ) 5 MG tablet Take 1 tablet (5 mg total) by mouth every evening. 09/02/23  Yes Arfeen, Leni DASEN, MD  azelastine  (ASTELIN ) 0.1 % nasal spray Place 2 sprays into both nostrils 2 (two) times daily. Use in each nostril as directed 10/22/22  Yes Antonetta Rollene BRAVO, MD  Empagliflozin-metFORMIN  HCl ER (SYNJARDY  XR) 12.12-998 MG TB24 Take two tablets by mouth once daily for diabetes 12/16/22  Yes Antonetta Rollene BRAVO, MD  fluticasone  (FLONASE ) 50 MCG/ACT nasal spray Place 2 sprays into both nostrils daily. 10/22/22  Yes Antonetta Rollene BRAVO, MD  levocetirizine (XYZAL) 5 MG tablet Take 5 mg by mouth every evening.   Yes [provider]  loperamide  (IMODIUM  A-D) 2 MG tablet Take 1 tablet (2 mg total) by mouth 4 (four) times daily as needed  for diarrhea or loose stools. 01/06/23  Yes Del Orbe Polanco, Hilario, FNP  losartan  (COZAAR ) 50 MG tablet Take 1 tablet (50 mg total) by mouth daily. 10/07/23  Yes Antonetta Rollene BRAVO, MD  montelukast  (SINGULAIR ) 10 MG tablet Take 1 tablet (10 mg total) by mouth at bedtime. 12/16/22  Yes Antonetta Rollene BRAVO, MD  omeprazole  (PRILOSEC ) 40 MG capsule Take 1 capsule (40 mg total) by mouth daily. 10/07/23  Yes Rudy Josette RAMAN, PA-C   omeprazole  (PRILOSEC ) 40 MG capsule Take 1 capsule (40 mg total) by mouth daily. 10/07/23  Yes Rudy Josette RAMAN, PA-C  rosuvastatin  (CRESTOR ) 5 MG tablet Take one tablet by mouth two times weekly 06/30/23  Yes Antonetta Rollene BRAVO, MD  MOUNJARO  7.5 MG/0.5ML Pen Inject 7.5 mg into the skin once a week.    [provider]  ondansetron  (ZOFRAN ) 4 MG tablet Take 1 tablet (4 mg total) by mouth every 8 (eight) hours as needed for nausea or vomiting. 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   Peanut-containing drug products Anaphylaxis 02/08/2013   Augmentin [amoxicillin-pot clavulanate] Nausea And Vomiting 04/07/2013    Family History  Problem Relation Age of Onset   Diabetes Mother    Hypertension Mother    Fibromyalgia Mother    Stroke Mother    Diabetes Father    Hypertension Father    Asthma Father    Heart disease Father    Depression Father    Colon cancer Neg Hx    Breast cancer Neg Hx    Inflammatory bowel disease Neg Hx     Social History   Socioeconomic History   Marital status: Married    Spouse name: Not on file   Number of children: Not on file   Years of education: Not on file   Highest education level: Bachelor's degree (e.g., BA, AB, BS)  Occupational History   Not on file  Tobacco Use   Smoking status: Never   Smokeless tobacco: Never   Tobacco comments:    not everyday  Vaping Use   Vaping status: Never Used  Substance and Sexual Activity   Alcohol use: Yes    Alcohol/week: 0.0 standard drinks of alcohol    Comment: occ   Drug use: No   Sexual activity: Yes    Partners: Male    Birth control/protection: Surgical  Other Topics Concern   Not on file  Social History Narrative   Not on file   Social Drivers of Health   Financial Resource Strain: Low Risk  (01/30/2024)   Overall Financial Resource  Strain (CARDIA)    Difficulty of Paying Living Expenses: Not hard at all  Food Insecurity: No Food Insecurity (01/30/2024)   Hunger Vital Sign    Worried About Running Out of Food in the Last Year: Never true    Ran Out of Food in the Last Year: Never true  Transportation Needs: No Transportation Needs (01/30/2024)   PRAPARE - Administrator, Civil Service (Medical): No    Lack of Transportation (Non-Medical): No  Physical Activity: Insufficiently Active (01/30/2024)   Exercise Vital Sign    Days of Exercise per Week: 2 days    Minutes of Exercise per Session: 30 min  Stress: No Stress Concern Present (01/30/2024)   [employer] of Occupational Health - Occupational Stress Questionnaire  Feeling of Stress: Only a little  Social Connections: Socially Integrated (01/30/2024)   Social Connection and Isolation Panel    Frequency of Communication with Friends and Family: More than three times a week    Frequency of Social Gatherings with Friends and Family: Three times a week    Attends Religious Services: More than 4 times per year    Active Member of Clubs or Organizations: Yes    Attends Engineer, structural: More than 4 times per year    Marital Status: Married  Catering manager Violence: Not on file    Review of Systems: General: Negative for fever, chills, fatigue, weakness. Eyes: Negative for vision changes.  ENT: Negative for hoarseness, difficulty swallowing , nasal congestion. CV: Negative for chest pain, angina, palpitations, dyspnea on exertion, peripheral edema.  Respiratory: Negative for dyspnea at rest, dyspnea on exertion, cough, sputum, wheezing.  GI: See history of present illness. GU:  Negative for dysuria, hematuria, urinary incontinence, urinary frequency, nocturnal urination.  MS: Negative for joint pain, low back pain.  Derm: Negative for rash or itching.  Neuro: Negative for weakness, abnormal sensation, seizure, frequent headaches,  memory loss, confusion.  Psych: Negative for anxiety, depression Endo: Negative for unusual weight change.  Heme: Negative for bruising or bleeding. Allergy: Negative for rash or hives.  Physical Exam: Vital signs in last 24 hours: Temp:  [98.2 F (36.8 C)] 98.2 F (36.8 C) (06/30 1120) Pulse Rate:  [80] 80 (06/30 1120) Resp:  [12] 12 (06/30 1120) BP: (131)/(88) 131/88 (06/30 1120) SpO2:  [100 %] 100 % (06/30 1120) Weight:  [77.1 kg] 77.1 kg (06/30 1120)   General:   Alert,  Well-developed, well-nourished, pleasant and cooperative in NAD Head:  Normocephalic and atraumatic. Eyes:  Sclera clear, no icterus.   Conjunctiva pink. Ears:  Normal auditory acuity. Nose:  No deformity, discharge,  or lesions. Msk:  Symmetrical without gross deformities. Normal posture. Extremities:  Without clubbing or edema. Neurologic:  Alert and  oriented x4;  grossly normal neurologically. Skin:  Intact without significant lesions or rashes. Psych:  Alert and cooperative. Normal mood and affect.   Impression/Plan: Christina [name] is here for an EGD and colonoscopy to be performed for nausea, vomiting, diarrhea.  Risks, benefits, limitations, imponderables and alternatives regarding procedure have been reviewed with the patient. Questions have been answered. All parties agreeable.

## 2024-01-31 NOTE — Op Note (Signed)
 [facility] Patient Name: Christina [name] Procedure Date: 01/31/2024 12:24 PM MRN: [id number] Date of Birth: 02/04/1976 Attending MD: Carlin POUR. Cindie , [place], [id number] CSN: [id number] Age: 48 Admit Type: Outpatient Procedure:                Upper GI endoscopy Indications:              Diarrhea, Nausea with vomiting Providers:                Carlin POUR. Cindie, DO, [address], [place]                            Goins, Kristine L. Shirlean Balm, Technician Referring MD:              Medicines:                See the Anesthesia note for documentation of the                            administered medications Complications:            No immediate complications. Estimated Blood Loss:     Estimated blood loss was minimal. Procedure:                Pre-Anesthesia Assessment:                           - The anesthesia plan was to use monitored                            anesthesia care (MAC).                           After obtaining informed consent, the endoscope was                            passed under direct vision. Throughout the                            procedure, the patient's blood pressure, pulse, and                            oxygen saturations were monitored continuously. The                            GIF-H190 ([id number]) scope was introduced through the                            mouth, and advanced to the second part of duodenum.                            The upper GI endoscopy was accomplished without                            difficulty. The patient tolerated the procedure  well. Scope In: 12:41:44 PM Scope Out: 12:44:54 PM Total Procedure Duration: 0 hours 3 minutes 10 seconds  Findings:      The Z-line was regular and was found 40 cm from the incisors.      Patchy minimal inflammation characterized by erythema was found in the       gastric body and in the gastric antrum. Biopsies were taken with a cold       forceps for Helicobacter  pylori testing.      Multiple small fundic gland polyps with no bleeding and no stigmata of       recent bleeding were found in the gastric body.      The duodenal bulb, first portion of the duodenum and second portion of       the duodenum were normal. Impression:               - Z-line regular, 40 cm from the incisors.                           - Gastritis. Biopsied.                           - Multiple gastric polyps.                           - Normal duodenal bulb, first portion of the                            duodenum and second portion of the duodenum. Moderate Sedation:      Per Anesthesia Care Recommendation:           - Patient has a contact number available for                            emergencies. The signs and symptoms of potential                            delayed complications were discussed with the                            patient. Return to normal activities tomorrow.                            Written discharge instructions were provided to the                            patient.                           - Resume previous diet.                           - Continue present medications.                           - Await pathology results.                           - Use  Prilosec  (omeprazole ) 40 mg PO daily.                           - Return to GI clinic in 8 weeks. Procedure Code(s):        --- Professional ---                           [phone], Esophagogastroduodenoscopy, flexible,                            transoral; with biopsy, single or multiple Diagnosis Code(s):        --- Professional ---                           K29.70, Gastritis, unspecified, without bleeding                           K31.7, Polyp of stomach and duodenum                           R19.7, Diarrhea, unspecified                           R11.2, Nausea with vomiting, unspecified CPT copyright 2022 American Medical Association. All rights reserved. The codes documented in this report are  preliminary and upon coder review may  be revised to meet current compliance requirements. Carlin POUR. Cindie, DO Carlin POUR. Cindie, DO 01/31/2024 12:47:45 PM This report has been signed electronically. Number of Addenda: 0

## 2024-01-31 NOTE — Discharge Instructions (Addendum)
 EGD Discharge instructions Please read the instructions outlined below and refer to this sheet in the next few weeks. These discharge instructions provide you with general information on caring for yourself after you leave the hospital. Your doctor may also give you specific instructions. While your treatment has been planned according to the most current medical practices available, unavoidable complications occasionally occur. If you have any problems or questions after discharge, please call your doctor. ACTIVITY You may resume your regular activity but move at a slower pace for the next 24 hours.  Take frequent rest periods for the next 24 hours.  Walking will help expel (get rid of) the air and reduce the bloated feeling in your abdomen.  No driving for 24 hours (because of the anesthesia (medicine) used during the test).  You may shower.  Do not sign any important legal documents or operate any machinery for 24 hours (because of the anesthesia used during the test).  NUTRITION Drink plenty of fluids.  You may resume your normal diet.  Begin with a light meal and progress to your normal diet.  Avoid alcoholic beverages for 24 hours or as instructed by your caregiver.  MEDICATIONS You may resume your normal medications unless your caregiver tells you otherwise.  WHAT YOU CAN EXPECT TODAY You may experience abdominal discomfort such as a feeling of fullness or "gas" pains.  FOLLOW-UP Your doctor will discuss the results of your test with you.  SEEK IMMEDIATE MEDICAL ATTENTION IF ANY OF THE FOLLOWING OCCUR: Excessive nausea (feeling sick to your stomach) and/or vomiting.  Severe abdominal pain and distention (swelling).  Trouble swallowing.  Temperature over 101 F (37.8 C).  Rectal bleeding or vomiting of blood.    Colonoscopy Discharge Instructions  Read the instructions outlined below and refer to this sheet in the next few weeks. These discharge instructions provide you with  general information on caring for yourself after you leave the hospital. Your doctor may also give you specific instructions. While your treatment has been planned according to the most current medical practices available, unavoidable complications occasionally occur.   ACTIVITY You may resume your regular activity, but move at a slower pace for the next 24 hours.  Take frequent rest periods for the next 24 hours.  Walking will help get rid of the air and reduce the bloated feeling in your belly (abdomen).  No driving for 24 hours (because of the medicine (anesthesia) used during the test).   Do not sign any important legal documents or operate any machinery for 24 hours (because of the anesthesia used during the test).  NUTRITION Drink plenty of fluids.  You may resume your normal diet as instructed by your doctor.  Begin with a light meal and progress to your normal diet. Heavy or fried foods are harder to digest and may make you feel sick to your stomach (nauseated).  Avoid alcoholic beverages for 24 hours or as instructed.  MEDICATIONS You may resume your normal medications unless your doctor tells you otherwise.  WHAT YOU CAN EXPECT TODAY Some feelings of bloating in the abdomen.  Passage of more gas than usual.  Spotting of blood in your stool or on the toilet paper.  IF YOU HAD POLYPS REMOVED DURING THE COLONOSCOPY: No aspirin  products for 7 days or as instructed.  No alcohol for 7 days or as instructed.  Eat a soft diet for the next 24 hours.  FINDING OUT THE RESULTS OF YOUR TEST Not all test results are available  during your visit. If your test results are not back during the visit, make an appointment with your caregiver to find out the results. Do not assume everything is normal if you have not heard from your caregiver or the medical facility. It is important for you to follow up on all of your test results.  SEEK IMMEDIATE MEDICAL ATTENTION IF: You have more than a spotting of  blood in your stool.  Your belly is swollen (abdominal distention).  You are nauseated or vomiting.  You have a temperature over 101.  You have abdominal pain or discomfort that is severe or gets worse throughout the day.   Your EGD revealed mild amount inflammation in your stomach.  I took biopsies of this to rule out infection with a bacteria called H. pylori.  Esophagus and small bowel appeared normal.   Continue on omeprazole .  Overall, your colon appeared very healthy.  I did not see any active inflammation indicative of underlying inflammatory bowel disease such as Crohn's disease or ulcerative colitis throughout your colon or end portion of your small bowel.  I took biopsies of your colon to further evaluate.  Await pathology results, my office will contact you.  I did not find any polyps or evidence of colon cancer.  I recommend repeating colonoscopy in 10 years for colon cancer screening purposes.    Follow-up with GI in 6-8 weeks  Message sent to office    I hope you have a great rest of your week!  Carlin POUR. Cindie, D.O. Gastroenterology and Hepatology [facility]

## 2024-01-31 NOTE — Transfer of Care (Signed)
 Immediate Anesthesia Transfer of Care Note  Patient: Christina [name]  Procedure(s) Performed: COLONOSCOPY EGD (ESOPHAGOGASTRODUODENOSCOPY)  Patient Location: PACU  Anesthesia Type:MAC  Level of Consciousness: drowsy  Airway & Oxygen Therapy: Patient Spontanous Breathing  Post-op Assessment: Report given to RN and Post -op Vital signs reviewed and stable  Post vital signs: Reviewed and stable  Last Vitals:  Vitals Value Taken Time  BP 118/81 01/31/24 13:19  Temp 36.5 C 01/31/24 13:19  Pulse 83 01/31/24 13:19  Resp 16 01/31/24 13:19  SpO2 100 % 01/31/24 13:19    Last Pain:  Vitals:   01/31/24 1319  TempSrc: Oral  PainSc:       Patients Stated Pain Goal: 8 (01/31/24 1120)  Complications: No notable events documented.

## 2024-01-31 NOTE — Anesthesia Preprocedure Evaluation (Addendum)
 Anesthesia Evaluation  Patient identified by MRN, date of birth, ID band Patient awake    Reviewed: Allergy & Precautions, H&P , NPO status , Patient's Chart, lab work & pertinent test results  Airway Mallampati: II  TM Distance: >3 FB Neck ROM: Full    Dental no notable dental hx.    Pulmonary neg pulmonary ROS   Pulmonary exam normal breath sounds clear to auscultation       Cardiovascular hypertension, Normal cardiovascular exam Rhythm:Regular Rate:Normal     Neuro/Psych  PSYCHIATRIC DISORDERS  Depression Bipolar Disorder    Neuromuscular disease    GI/Hepatic Neg liver ROS,GERD  ,,  Endo/Other  diabetes    Renal/GU negative Renal ROS  negative genitourinary   Musculoskeletal negative musculoskeletal ROS (+)    Abdominal   Peds negative pediatric ROS (+)  Hematology  (+) Blood dyscrasia, anemia   Anesthesia Other Findings   Reproductive/Obstetrics negative OB ROS                             Anesthesia Physical Anesthesia Plan  ASA: 2  Anesthesia Plan: General   Post-op Pain Management:    Induction: Intravenous  PONV Risk Score and Plan: Propofol  infusion  Airway Management Planned: Nasal Cannula  Additional Equipment:   Intra-op Plan:   Post-operative Plan:   Informed Consent: I have reviewed the patients History and Physical, chart, labs and discussed the procedure including the risks, benefits and alternatives for the proposed anesthesia with the patient or authorized representative who has indicated his/her understanding and acceptance.     Dental advisory given  Plan Discussed with: CRNA  Anesthesia Plan Comments:        Anesthesia Quick Evaluation

## 2024-02-02 ENCOUNTER — Encounter (HOSPITAL_COMMUNITY): Payer: Self-pay | Admitting: Internal Medicine

## 2024-02-02 ENCOUNTER — Ambulatory Visit: Payer: Self-pay | Admitting: Family Medicine

## 2024-02-02 ENCOUNTER — Encounter (HOSPITAL_COMMUNITY): Payer: Self-pay

## 2024-02-02 ENCOUNTER — Emergency Department (HOSPITAL_COMMUNITY)
Admission: EM | Admit: 2024-02-02 | Discharge: 2024-02-02 | Disposition: A | Discharge status: Home / Self Care | Payer: PRIVATE HEALTH INSURANCE | Attending: Emergency Medicine | Admitting: Emergency Medicine

## 2024-02-02 ENCOUNTER — Other Ambulatory Visit: Payer: Self-pay

## 2024-02-02 ENCOUNTER — Emergency Department (HOSPITAL_COMMUNITY): Payer: PRIVATE HEALTH INSURANCE

## 2024-02-02 ENCOUNTER — Ambulatory Visit: Payer: PRIVATE HEALTH INSURANCE | Admitting: Family Medicine

## 2024-02-02 VITALS — BP 138/88 | HR 88 | Resp 16 | Ht 65.0 in | Wt 183.0 lb

## 2024-02-02 DIAGNOSIS — Z8616 Personal history of COVID-19: Secondary | ICD-10-CM | POA: Insufficient documentation

## 2024-02-02 DIAGNOSIS — Z79899 Other long term (current) drug therapy: Secondary | ICD-10-CM | POA: Insufficient documentation

## 2024-02-02 DIAGNOSIS — E66811 Obesity, class 1: Secondary | ICD-10-CM | POA: Diagnosis not present

## 2024-02-02 DIAGNOSIS — Z794 Long term (current) use of insulin: Secondary | ICD-10-CM | POA: Insufficient documentation

## 2024-02-02 DIAGNOSIS — I82621 Acute embolism and thrombosis of deep veins of right upper extremity: Secondary | ICD-10-CM | POA: Insufficient documentation

## 2024-02-02 DIAGNOSIS — E119 Type 2 diabetes mellitus without complications: Secondary | ICD-10-CM | POA: Diagnosis not present

## 2024-02-02 DIAGNOSIS — F3162 Bipolar disorder, current episode mixed, moderate: Secondary | ICD-10-CM

## 2024-02-02 DIAGNOSIS — Z91010 Allergy to peanuts: Secondary | ICD-10-CM | POA: Diagnosis not present

## 2024-02-02 DIAGNOSIS — Z7984 Long term (current) use of oral hypoglycemic drugs: Secondary | ICD-10-CM | POA: Insufficient documentation

## 2024-02-02 DIAGNOSIS — Z7901 Long term (current) use of anticoagulants: Secondary | ICD-10-CM | POA: Insufficient documentation

## 2024-02-02 DIAGNOSIS — I10 Essential (primary) hypertension: Secondary | ICD-10-CM | POA: Insufficient documentation

## 2024-02-02 DIAGNOSIS — M7989 Other specified soft tissue disorders: Secondary | ICD-10-CM

## 2024-02-02 DIAGNOSIS — Z23 Encounter for immunization: Secondary | ICD-10-CM | POA: Diagnosis not present

## 2024-02-02 DIAGNOSIS — E1169 Type 2 diabetes mellitus with other specified complication: Secondary | ICD-10-CM

## 2024-02-02 DIAGNOSIS — M79602 Pain in left arm: Secondary | ICD-10-CM | POA: Diagnosis not present

## 2024-02-02 DIAGNOSIS — Z6379 Other stressful life events affecting family and household: Secondary | ICD-10-CM

## 2024-02-02 LAB — SURGICAL PATHOLOGY

## 2024-02-02 MED ORDER — APIXABAN (ELIQUIS) VTE STARTER PACK (10MG AND 5MG): ORAL_TABLET | ORAL | 0 refills | Status: DC

## 2024-02-02 MED ORDER — IBUPROFEN 400 MG PO TABS
600.0000 mg | ORAL_TABLET | Freq: Once | ORAL | Status: AC
  Administered 2024-02-02: 600 mg via ORAL
  Filled 2024-02-02: qty 2

## 2024-02-02 MED ORDER — LOSARTAN POTASSIUM 50 MG PO TABS: 50.0000 mg | ORAL_TABLET | Freq: Every day | ORAL | 2 refills | Status: DC

## 2024-02-02 MED ORDER — ROSUVASTATIN CALCIUM 5 MG PO TABS: ORAL_TABLET | ORAL | 3 refills | Status: AC

## 2024-02-02 NOTE — Patient Instructions (Addendum)
 F/U in 4.5 months, call if you need me sooner  Pls get hBA1C, cmp and EgFR, fasting lipid panel. And urine ACR first week in September   PNEUMONIA 20 IN OFFICE TODAY, LEFT ARM PLEASE  Please go to short stay for evaluation and management of the affected arm  Thanks for choosing [facility], we consider it a privelige to serve you.

## 2024-02-02 NOTE — Discharge Instructions (Addendum)
 We evaluated you for your arm swelling after your procedure.  Your ultrasound does show a DVT (deep vein thrombosis).  This is a blood clot in the vein of your upper arm.  Please elevate your arm, take 1000 mg of Tylenol  every 6 hours, and ice your arm as needed for pain or swelling.  This type of blood clot can cause other issues like a blood clot in your lung, so we do recommend starting a blood thinner.  Usually you are on a blood thinner for about 3 months.  Please follow-up with the vascular surgeon, Dr. Serene or one of his partners.  They can determine when is the best time to stop the blood thinner.  Blood thinners can cause issues with bleeding.  If you notice any signs of bleeding such as black stools or bloody stools, vomiting blood, uncontrolled nosebleeds, lightheadedness or dizziness, fainting, please return to the emergency department.  Please also return to the emergency department if you hit your head or have a severe headache.  Please also return if you develop any new or worsening symptoms such as increasing swelling in your arm, numbness or tingling in your arm, chest pain, difficulty breathing, lightheadedness or dizziness, fainting, or any other concerning symptoms.

## 2024-02-02 NOTE — ED Notes (Signed)
 Patients hand is swollen with little redness, and patients arm is also swollen at wrist and up forearm. It is a little warm to the touch. Patient states it is very painful to the touch.   Patient stated pain started Monday after glucose was pushed after colonoscopy. It has been hurting ever since.

## 2024-02-02 NOTE — OR Nursing (Signed)
 Dr. Antonetta called Endoscopy charge with complaints of warmth and swelling in right  arm that an IV was placed in on Monday with this patient . She wanted the patients arm to be evaluated . Slight swelling noted by Charlena Edison . I asked my supervisors what should be done . I was instructed to send the patient to ER. Patient said she would go to the ER for observation.

## 2024-02-02 NOTE — ED Provider Notes (Signed)
 [place] EMERGENCY DEPARTMENT AT [facility] Provider Note  CSN: [id number] Arrival date & time: 02/02/24 1435  Chief Complaint(s) Post-op Problem  HPI Christina [name] is a 48 y.o. female history of diabetes presenting to the emergency department with swelling.  Patient reports swelling to the right arm.  Reports she had IV placed around 3 days ago for colonoscopy, had some pain at the time but worse last night.  Saw primary doctor today who advised she should go to the ER.  Denies history of DVT.  No other injury or trauma to the arm.  No fevers or chills.  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   GERD (gastroesophageal reflux disease)    H/O acute sinusitis 10/2016   Hypertension    Thyroid  enlargement    Wears glasses    Patient Active Problem List   Diagnosis Date Noted   Vitamin D  deficiency 07/09/2023   Lipid screening 07/09/2023   Irregular menses 02/23/2023   Urinary incontinence 11/15/2022   Bloody discharge from left nipple 01/06/2022   Purulent discharge from nipple 01/06/2022   Stress due to illness of family member 12/29/2021   Grief 03/27/2021   Morbid obesity (HCC) 03/27/2021   COVID-19 12/31/2020   Abnormal EKG 08/28/2019   Dysuria 01/31/2018   Diastasis recti 09/21/2017   Bipolar 1 disorder, mixed, moderate (HCC) 06/17/2016   Type 2 diabetes mellitus with other specified complication (HCC) 03/16/2016   Anemia of pregnancy 01/02/2016   Bipolar 1 disorder (HCC) 11/19/2014   Fatty liver 10/21/2013   Essential hypertension, benign 08/09/2013   Iron deficiency anemia 05/15/2013   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   HSV-2 infection complicating  pregnancy 03/06/2013   Family history of blood disorder 02/28/2013   Reaction to tuberculin skin test without active tuberculosis 02/28/2013   Seasonal allergies 02/28/2013   History of positive PPD 02/28/2013   GASTROPARESIS 09/02/2009   GERD 01/27/2009   Home Medication(s) Prior to Admission medications   Medication Sig Start Date End Date Taking? Authorizing Provider  acetaminophen  (TYLENOL ) 500 MG tablet Take 1,000 mg every 8 (eight) hours as needed by mouth for mild pain.    Yes [provider]  APIXABAN WINN) VTE STARTER PACK (10MG  AND 5MG ) Take as directed on package: start with two-5mg  tablets twice daily for 7 days. On day 8, switch to one-5mg  tablet twice daily. 02/02/24  Yes Francesca Elsie CROME, MD  ARIPiprazole  (ABILIFY ) 5 MG tablet Take 1 tablet (5 mg total) by mouth every evening. 09/02/23  Yes Arfeen, Leni DASEN, MD  azelastine  (ASTELIN ) 0.1 % nasal spray Place 2 sprays into both nostrils 2 (two) times daily. Use in each nostril as directed 10/22/22  Yes Antonetta Rollene BRAVO, MD  doxycycline  (VIBRA -TABS) 100 MG tablet Take 100 mg by mouth 2 (two) times daily. 01/24/24  Yes [provider]  Empagliflozin-metFORMIN  HCl ER (SYNJARDY  XR) 12.12-998 MG TB24 Take two tablets by mouth once daily for diabetes 12/16/22  Yes Antonetta Rollene BRAVO, MD  fluticasone  (FLONASE ) 50 MCG/ACT nasal spray Place 2 sprays into both nostrils daily. Patient taking differently: Place 2 sprays into  both nostrils in the morning and at bedtime. 10/22/22  Yes Antonetta Rollene BRAVO, MD  ibuprofen  (ADVIL ) 200 MG tablet Take 200 mg by mouth every 6 (six) hours as needed for mild pain (pain score 1-3), cramping or headache.   Yes [provider]  levocetirizine (XYZAL) 5 MG tablet Take 5 mg by mouth every evening.   Yes [provider]  losartan  (COZAAR ) 50 MG tablet Take 1 tablet (50 mg total) by mouth daily. 02/02/24  Yes Antonetta Rollene BRAVO, MD  montelukast  (SINGULAIR ) 10 MG tablet Take 1  tablet (10 mg total) by mouth at bedtime. 12/16/22  Yes Antonetta Rollene BRAVO, MD  MOUNJARO  7.5 MG/0.5ML Pen Inject 7.5 mg into the skin every Friday.   Yes [provider]  omeprazole  (PRILOSEC ) 40 MG capsule Take 1 capsule (40 mg total) by mouth daily. 10/07/23  Yes Rudy Josette RAMAN, PA-C  ondansetron  (ZOFRAN ) 4 MG tablet Take 1 tablet (4 mg total) by mouth every 8 (eight) hours as needed for nausea or vomiting. 11/02/22  Yes Rudy Josette RAMAN, PA-C  Probiotic Product (PROBIOTIC ADVANCED PO) Take 1 capsule by mouth 2 (two) times daily as needed (with antibiotic).   Yes [provider]  rosuvastatin  (CRESTOR ) 5 MG tablet Take one tablet by mouth two times weekly Patient taking differently: Take 5 mg by mouth 2 (two) times a week. Mondays and Wednesdays 02/02/24  Yes Antonetta Rollene BRAVO, MD  GAVILYTE-N  WITH FLAVOR PACK 420 g solution Take 4,000 mLs by mouth once. 01/22/24   [provider]                                                                                                                                    Past Surgical History Past Surgical History:  Procedure Laterality Date   BREAST BIOPSY Left 02/09/2022   BREAST BIOPSY Left 02/13/2022   BREAST EXCISIONAL BIOPSY Left 03/18/2022   BREAST REDUCTION SURGERY  1994   BREAST SURGERY N/A    Phreesia 08/26/2020   CESAREAN SECTION N/A 03/06/2016   Procedure: CESAREAN SECTION;  Surgeon: Rome Rigg, MD;  Location: [facility];  Service: Obstetrics;  Laterality: N/A;   COLONOSCOPY N/A 01/31/2024   Procedure: COLONOSCOPY;  Surgeon: Cindie Carlin POUR, DO;  Location: [facility];  Service: Endoscopy;  Laterality: N/A;  1230pm, asa 2   COLONOSCOPY WITH PROPOFOL  N/A 10/26/2017   Dr. harvey: Torturous transverse and sigmoid colon, internal hemorrhoids.  Next colonoscopy in 10 years with MAC and color wrap   dermoid tumor  2000   DILITATION & CURRETTAGE/HYSTROSCOPY WITH NOVASURE ABLATION N/A 12/10/2016    Procedure: DILATATION & CURETTAGE/HYSTEROSCOPY WITH NOVASURE ABLATION;  Surgeon: Rigg Rome, MD;  Location: [facility];  Service: Gynecology;  Laterality: N/A;   ESOPHAGOGASTRODUODENOSCOPY  07/01/2009   DOQ:wnmfjo esphagus without barrett's/dilation with 16 mm/mild erthyema in the antrum. mild chronic gastritis on path.    ESOPHAGOGASTRODUODENOSCOPY N/A  01/31/2024   Procedure: EGD (ESOPHAGOGASTRODUODENOSCOPY);  Surgeon: Cindie Carlin POUR, DO;  Location: [facility];  Service: Endoscopy;  Laterality: N/A;   ESOPHAGOGASTRODUODENOSCOPY (EGD) WITH PROPOFOL  N/A 10/26/2017   Dr. harvey: Gastritis, gastric polyps.  Biopsies benign.  Small bowel biopsies negative for celiac.  No H. pylori.   HERNIA REPAIR  12/2018   RADIOACTIVE SEED GUIDED EXCISIONAL BREAST BIOPSY Left 03/18/2022   Procedure: LEFT BREAST RADIOACTIVE SEED LOCALIZED EXCISIONAL BIOPSY;  Surgeon: Belinda Cough, MD;  Location: [facility];  Service: General;  Laterality: Left;   REDUCTION MAMMAPLASTY     1994   TRIGGER FINGER RELEASE Bilateral 06/2015   TUBAL LIGATION Bilateral 03/06/2016   Procedure: BILATERAL TUBAL LIGATION;  Surgeon: Rome Rigg, MD;  Location: [facility]TH[facility] SU[facility];  Service: Obstetrics;  Laterality: Bilateral;   Family History Family History  Problem Relation Age of Onset   Diabetes Mother    Hypertension Mother    Fibromyalgia Mother    Stroke Mother    Diabetes Father    Hypertension Father    Asthma Father    Heart disease Father    Depression Father    Colon cancer Neg Hx    Breast cancer Neg Hx    Inflammatory bowel disease Neg Hx     Social History Social History   Tobacco Use   Smoking status: Never    Passive exposure: Never   Smokeless tobacco: Never   Tobacco comments:    not everyday  Vaping Use   Vaping status: Never Used  Substance Use Topics   Alcohol use: Yes    Alcohol/week: 0.0 standard drinks of alcohol    Comment: occ   Drug use: No    Allergies Other, Peanut oil, Peanut-containing drug products, and Augmentin [amoxicillin-pot clavulanate]  Review of Systems Review of Systems  All other systems reviewed and are negative.   Physical Exam Vital Signs  I have reviewed the triage vital signs BP (!) 147/100 (BP Location: Left Arm)   Pulse 87   Temp 97.7 F (36.5 C)   Resp 20   SpO2 100%  Physical Exam Vitals and nursing note reviewed.  Constitutional:      General: She is not in acute distress.    Appearance: She is well-developed.  HENT:     Head: Normocephalic and atraumatic.     Mouth/Throat:     Mouth: Mucous membranes are moist.  Eyes:     Pupils: Pupils are equal, round, and reactive to light.  Cardiovascular:     Rate and Rhythm: Normal rate and regular rhythm.     Heart sounds: No murmur heard. Pulmonary:     Effort: Pulmonary effort is normal. No respiratory distress.     Breath sounds: Normal breath sounds.  Abdominal:     General: Abdomen is flat.     Palpations: Abdomen is soft.     Tenderness: There is no abdominal tenderness.  Musculoskeletal:        General: No tenderness.     Right lower leg: No edema.     Left lower leg: No edema.     Comments: Mild right upper extremity edema to the right hand and forearm with mild tenderness and mild warmth.  No erythema, 2+ distal radial pulse, no neurodeficit.  Skin:    General: Skin is warm and dry.  Neurological:     General: No focal deficit present.     Mental Status: She is alert. Mental status is at baseline.  Psychiatric:        Mood and Affect: Mood normal.        Behavior: Behavior normal.     ED Results and Treatments Labs (all labs ordered are listed, but only abnormal results are displayed) Labs Reviewed - No data to display                                                                                                                        Radiology US  Venous Img Upper Right (DVT Study) Result Date: 02/02/2024 CLINICAL  DATA:  Right upper extremity pain and edema. EXAM: RIGHT UPPER EXTREMITY VENOUS DOPPLER ULTRASOUND TECHNIQUE: Gray-scale sonography with graded compression, as well as color Doppler and duplex ultrasound were performed to evaluate the upper extremity deep venous system from the level of the subclavian vein and including the jugular, axillary, basilic, radial, ulnar and upper cephalic vein. Spectral Doppler was utilized to evaluate flow at rest and with distal augmentation maneuvers. COMPARISON:  None Available. FINDINGS: Contralateral Subclavian Vein: Respiratory phasicity is normal and symmetric with the symptomatic side. No evidence of thrombus. Normal compressibility. Internal Jugular Vein: No evidence of thrombus. Normal compressibility, respiratory phasicity and response to augmentation. Subclavian Vein: No evidence of thrombus. Normal compressibility, respiratory phasicity and response to augmentation. Axillary Vein: No evidence of thrombus. Normal compressibility, respiratory phasicity and response to augmentation. Cephalic Vein: No evidence of thrombus. Normal compressibility, respiratory phasicity and response to augmentation. Basilic Vein: No evidence of thrombus. Normal compressibility, respiratory phasicity and response to augmentation. Brachial Veins: Thrombus in the brachial vein of the upper arm. Thrombus appears occlusive. Radial Veins: No evidence of thrombus. Normal compressibility, respiratory phasicity and response to augmentation. Ulnar Veins: No evidence of thrombus. Normal compressibility, respiratory phasicity and response to augmentation. Venous Reflux:  None visualized. Other Findings: No evidence of superficial thrombophlebitis or abnormal fluid collection. IMPRESSION: Occlusive DVT in the right brachial vein of the upper arm. Electronically Signed   By: Marcey Moan M.D.   On: 02/02/2024 16:08    Pertinent labs & imaging results that were available during my care of the patient were  reviewed by me and considered in my medical decision making (see MDM for details).  Medications Ordered in ED Medications  ibuprofen  (ADVIL ) tablet 600 mg (600 mg Oral Given 02/02/24 1537)  Procedures Procedures  (including critical care time)  Medical Decision Making / ED Course   MDM:  48 year old presenting to the emergency department with arm pain and swelling.  Given recent IV placement, suspect possible thrombophlebitis.  Given pain and tenderness, consider also DVT although would be less likely.  Will obtain ultrasound.  If this is negative we will treat as thrombophlebitis.  No signs of infection without erythema or fevers.  Clinical Course as of 02/02/24 1651  Wed Feb 02, 2024  1649 Ultrasound does show a DVT in the right brachial vein.  Given this, will start Eliquis.  Patient denies any history of GI bleed or other bleeding issues.  Did have recent colonoscopy only 3 days ago which did not show any signs of bleeding.  Discussed risks of blood thinner as well as benefits with the patient and she consents to treatment with a blood thinner.  She understands reasons to return to the emergency department for worsening DVT symptoms as well as any signs of bleeding or head injury.  Advise follow-up with vascular surgery for DVT. Will discharge patient to home. All questions answered. Patient comfortable with plan of discharge. Return precautions discussed with patient and specified on the after visit summary.  [WS]    Clinical Course User Index [WS] Francesca, Elsie CROME, MD     Additional history obtained: -Additional history obtained from family -External records from outside source obtained and reviewed including: Chart review including previous notes, labs, imaging, consultation notes including prior colonoscopy/endoscopy    Medicines ordered and  prescription drug management: Meds ordered this encounter  Medications   ibuprofen  (ADVIL ) tablet 600 mg   APIXABAN (ELIQUIS) VTE STARTER PACK (10MG  AND 5MG )    Sig: Take as directed on package: start with two-5mg  tablets twice daily for 7 days. On day 8, switch to one-5mg  tablet twice daily.    Dispense:  74 each    Refill:  0    If starter pack unavailable, substitute with seventy-four 5 mg apixaban tabs following the above SIG directions.    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    Final Clinical Impression(s) / ED Diagnoses Final diagnoses:  Acute deep vein thrombosis (DVT) of brachial vein of right upper extremity (HCC)     This chart was dictated using voice recognition software.  Despite best efforts to proofread,  errors can occur which can change the documentation meaning.    Francesca Elsie CROME, MD 02/02/24 [phone]

## 2024-02-02 NOTE — ED Triage Notes (Signed)
 Pt arrived via POV c/o pain and possible infection in her distal right arm. Pt reports having an IV Access recently in her right hand for a colonoscopy procedure, and reports pain began while they were administering medications through her IV. Pt presents with swelling to her arm and it is warm to the touch.

## 2024-02-03 ENCOUNTER — Ambulatory Visit: Payer: PRIVATE HEALTH INSURANCE | Admitting: Psychology

## 2024-02-07 ENCOUNTER — Other Ambulatory Visit: Payer: Self-pay | Admitting: Family Medicine

## 2024-02-07 DIAGNOSIS — Z6379 Other stressful life events affecting family and household: Secondary | ICD-10-CM

## 2024-02-07 DIAGNOSIS — I10 Essential (primary) hypertension: Secondary | ICD-10-CM

## 2024-02-07 DIAGNOSIS — E1165 Type 2 diabetes mellitus with hyperglycemia: Secondary | ICD-10-CM

## 2024-02-07 DIAGNOSIS — F3162 Bipolar disorder, current episode mixed, moderate: Secondary | ICD-10-CM

## 2024-02-07 DIAGNOSIS — Z1231 Encounter for screening mammogram for malignant neoplasm of breast: Secondary | ICD-10-CM

## 2024-02-07 NOTE — Telephone Encounter (Signed)
 Urgent referral sent to requested Provider

## 2024-02-08 ENCOUNTER — Other Ambulatory Visit: Payer: Self-pay | Admitting: Family Medicine

## 2024-02-08 DIAGNOSIS — Z1231 Encounter for screening mammogram for malignant neoplasm of breast: Secondary | ICD-10-CM

## 2024-02-11 NOTE — Progress Notes (Unsigned)
 DVT Clinic Note  Name: Christina [name]     MRN: [id number]     DOB: 10-08-1975     Sex: female  PCP: Antonetta Rollene BRAVO, MD  Today's Visit: Visit Information: Initial Visit  Referred to DVT Clinic by: Primary Care - Dr. Antonetta Referred to CPP by: Dr. Serene Reason for referral:  Chief Complaint  Patient presents with   Med Management - DVT   HISTORY OF PRESENT ILLNESS: Christina [name] is a 48 y.o. female with PMH T2DM, bipolar disorder, HTN, who presents after diagnosis of DVT for medication management. Patient was s/p colonoscopy on 01/31/24 and developed pain and swelling around the site of her IV. On 02/02/24, she presented to the ED and was found to have occlusive DVT in the right brachial vein. She was started on Eliquis . The ED recommended vascular surgery follow up, so her PCP referred her after discharge for DVT Clinic follow up. Today, patient reports that her swelling has significantly improved but her arm is still very tender. Rates pain as a 6/10. Reports that they had some trouble placing her IV for her colonoscopy and had burning and pain with initial medication administration. The next day she noticed swelling and throbbing in her arm. Denies abnormal bleeding or bruising. Denies any known missed doses of Eliquis . Denies personal or family history of DVT.   Positive Thrombotic Risk Factors: Other (comment) (IV placed 01/31/24 immediately before symptom onset) Bleeding Risk Factors: Anticoagulant therapy  Negative Thrombotic Risk Factors: Previous VTE, Recent surgery (within 3 months), Recent trauma (within 3 months), Recent admission to hospital with acute illness (within 3 months), Paralysis, paresis, or recent plaster cast immobilization of lower extremity, Central venous catheterization, Bed rest >72 hours within 3 months, Sedentary journey lasting >8 hours within 4 weeks, Pregnancy, Within 6 weeks postpartum, Recent cesarean section (within 3 months), Estrogen therapy, Testosterone  therapy, Erythropoiesis-stimulating agent, Recent COVID diagnosis (within 3 months), Active cancer, Non-malignant, chronic inflammatory condition, Known thrombophilic condition, Smoking, Obesity, Older age  Rx Insurance Coverage: Commercial Rx Affordability: Eliquis   Rx Assistance Provided: Co-pay card Preferred Pharmacy: [facility] for free home delivery  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   GERD (gastroesophageal reflux disease)    H/O acute sinusitis 10/2016   Hypertension    Purulent discharge from nipple 01/06/2022   Thyroid  enlargement    Wears glasses     Past Surgical History:  Procedure Laterality Date   BREAST BIOPSY Left 02/09/2022   BREAST BIOPSY Left 02/13/2022   BREAST EXCISIONAL BIOPSY Left 03/18/2022   BREAST REDUCTION SURGERY  1994   BREAST SURGERY N/A    Phreesia 08/26/2020   CESAREAN SECTION N/A 03/06/2016   Procedure: CESAREAN SECTION;  Surgeon: Rome Rigg, MD;  Location: [facility];  Service: Obstetrics;  Laterality: N/A;   COLONOSCOPY N/A 01/31/2024   Procedure: COLONOSCOPY;  Surgeon: Cindie Carlin POUR, DO;  Location: [facility];  Service: Endoscopy;  Laterality: N/A;  1230pm, asa 2   COLONOSCOPY WITH PROPOFOL  N/A 10/26/2017   Dr. harvey: Torturous transverse and sigmoid colon, internal hemorrhoids.  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 mild chronic gastritis on path.    ESOPHAGOGASTRODUODENOSCOPY N/A 01/31/2024   Procedure: EGD (ESOPHAGOGASTRODUODENOSCOPY);  Surgeon: Cindie Carlin POUR, DO;  Location: [facility];  Service: Endoscopy;  Laterality: N/A;   ESOPHAGOGASTRODUODENOSCOPY (EGD) WITH PROPOFOL  N/A 10/26/2017   Dr. harvey: Gastritis, gastric polyps.  Biopsies benign.  Small bowel biopsies negative for celiac.  No H. pylori.   HERNIA REPAIR  12/2018   RADIOACTIVE SEED GUIDED EXCISIONAL BREAST BIOPSY Left 03/18/2022   Procedure: LEFT BREAST RADIOACTIVE SEED LOCALIZED EXCISIONAL BIOPSY;  Surgeon: Belinda Cough, MD;  Location: [facility];  Service: General;  Laterality: Left;   REDUCTION MAMMAPLASTY     1994   TRIGGER FINGER RELEASE Bilateral 06/2015   TUBAL LIGATION Bilateral 03/06/2016   Procedure: BILATERAL TUBAL LIGATION;  Surgeon: Fess Manda, MD;  Location: [facility]TH[facility] SU[facility];  Service: Obstetrics;  Laterality: Bilateral;    Social History   Socioeconomic History   Marital status: Married    Spouse name: Not on file   Number of children: Not on file   Years of education: Not on file   Highest education level: Bachelor's degree (e.g., BA, AB, BS)  Occupational History   Not on file  Tobacco Use   Smoking status: Never    Passive exposure: Never   Smokeless tobacco: Never   Tobacco comments:    not everyday  Vaping Use   Vaping status: Never Used  Substance and Sexual Activity   Alcohol use: Yes    Alcohol/week: 0.0 standard drinks of alcohol    Comment: occ   Drug use: No   Sexual activity: Yes    Partners: Male    Birth control/protection: Surgical  Other Topics Concern   Not on file  Social History Narrative   Not on file   Social Drivers of Health   Financial Resource Strain: Low Risk  (01/30/2024)   Overall Financial Resource Strain (CARDIA)    Difficulty of Paying Living Expenses:  Not hard at all  Food Insecurity: No Food Insecurity (01/30/2024)   Hunger Vital Sign    Worried About Running Out of Food in the Last Year: Never true    Ran Out of Food in the Last Year: Never true  Transportation Needs: No Transportation Needs (01/30/2024)   PRAPARE - Administrator, Civil Service (Medical): No    Lack of Transportation (Non-Medical): No  Physical Activity: Insufficiently Active (01/30/2024)   Exercise Vital Sign    Days of Exercise per Week: 2 days    Minutes of Exercise per Session: 30 min  Stress: No Stress Concern Present (01/30/2024)   [employer] of Occupational Health - Occupational Stress Questionnaire    Feeling of Stress: Only a little  Social Connections: Socially Integrated (01/30/2024)   Social Connection and Isolation Panel    Frequency of Communication with Friends and Family: More than three times a week    Frequency of Social Gatherings with Friends and Family: Three times a week    Attends Religious Services: More than 4 times per year    Active Member of Clubs or Organizations: Yes    Attends Banker Meetings:  More than 4 times per year    Marital Status: Married  Catering manager Violence: Not on file    Family History  Problem Relation Age of Onset   Diabetes Mother    Hypertension Mother    Fibromyalgia Mother    Stroke Mother    Diabetes Father    Hypertension Father    Asthma Father    Heart disease Father    Depression Father    Colon cancer Neg Hx    Breast cancer Neg Hx    Inflammatory bowel disease Neg Hx     Allergies as of 02/14/2024 - Review Complete 02/14/2024  Allergen Reaction Noted   Other Anaphylaxis 06/13/2013   Peanut oil Anaphylaxis 04/07/2013   Peanut-containing drug products Anaphylaxis 02/08/2013   Augmentin [amoxicillin-pot clavulanate] Nausea And Vomiting 04/07/2013    Current Outpatient Medications on File Prior to Visit  Medication Sig Dispense Refill   acetaminophen  (TYLENOL )  500 MG tablet Take 1,000 mg every 8 (eight) hours as needed by mouth for mild pain.      APIXABAN  (ELIQUIS ) VTE STARTER PACK (10MG  AND 5MG ) Take as directed on package: start with two-5mg  tablets twice daily for 7 days. On day 8, switch to one-5mg  tablet twice daily. 74 each 0   ARIPiprazole  (ABILIFY ) 5 MG tablet Take 1 tablet (5 mg total) by mouth every evening. 90 tablet 1   azelastine  (ASTELIN ) 0.1 % nasal spray Place 2 sprays into both nostrils 2 (two) times daily. Use in each nostril as directed 30 mL 12   benzonatate  (TESSALON ) 200 MG capsule Take 200 mg by mouth 3 (three) times daily as needed for cough.     cefdinir (OMNICEF) 300 MG capsule Take 300 mg by mouth 2 (two) times daily.     Empagliflozin-metFORMIN  HCl ER (SYNJARDY  XR) 12.12-998 MG TB24 Take two tablets by mouth once daily for diabetes 60 tablet 3   fluticasone  (FLONASE ) 50 MCG/ACT nasal spray Place 2 sprays into both nostrils daily. (Patient taking differently: Place 2 sprays into both nostrils in the morning and at bedtime.) 48 mL 5   levocetirizine (XYZAL) 5 MG tablet Take 5 mg by mouth every evening.     losartan  (COZAAR ) 50 MG tablet Take 1 tablet (50 mg total) by mouth daily. 90 tablet 2   montelukast  (SINGULAIR ) 10 MG tablet Take 1 tablet (10 mg total) by mouth at bedtime. 90 tablet 2   MOUNJARO  7.5 MG/0.5ML Pen Inject 7.5 mg into the skin every Friday.     omeprazole  (PRILOSEC ) 40 MG capsule Take 1 capsule (40 mg total) by mouth daily. 30 capsule 0   ondansetron  (ZOFRAN ) 4 MG tablet Take 1 tablet (4 mg total) by mouth every 8 (eight) hours as needed for nausea or vomiting. 30 tablet 1   Probiotic Product (PROBIOTIC ADVANCED PO) Take 1 capsule by mouth 2 (two) times daily as needed (with antibiotic).     rosuvastatin  (CRESTOR ) 5 MG tablet Take one tablet by mouth two times weekly (Patient taking differently: Take 5 mg by mouth 2 (two) times a week. Mondays and Wednesdays) 32 tablet 3   No current facility-administered  medications on file prior to visit.   REVIEW OF SYSTEMS:  Review of Systems  Respiratory:  Negative for shortness of breath.   Cardiovascular:  Negative for chest pain and palpitations.  Musculoskeletal:  Positive for myalgias.  Neurological:  Negative for dizziness and tingling.   PHYSICAL EXAMINATION:  Vitals:   02/14/24 0852  BP: (!) 128/94  Pulse: 95  SpO2: 98%  Weight: 179 lb 8 oz (81.4 kg)    Body mass index is 29.87 kg/m.  Physical Exam Vitals reviewed.  Cardiovascular:     Rate and Rhythm: Normal rate.  Pulmonary:     Effort: Pulmonary effort is normal.  Musculoskeletal:        General: Tenderness present. No swelling.  Skin:    Findings: No bruising or erythema.  Psychiatric:        Mood and Affect: Mood normal.        Behavior: Behavior normal.        Thought Content: Thought content normal.   LABS:  CBC     Component Value Date/Time   WBC 5.2 12/23/2023 1555   WBC 4.8 10/20/2017 1028   RBC 4.23 12/23/2023 1555   RBC 4.01 10/20/2017 1028   HGB 10.9 (L) 12/23/2023 1555   HGB 11.2 10/25/2012 0000   HCT 36.8 12/23/2023 1555   HCT 34 10/25/2012 0000   PLT 397 12/23/2023 1555   MCV 87 12/23/2023 1555   MCH 25.8 (L) 12/23/2023 1555   MCH 30.7 10/20/2017 1028   MCHC 29.6 (L) 12/23/2023 1555   MCHC 32.5 10/20/2017 1028   RDW 14.8 12/23/2023 1555   LYMPHSABS 3.2 (H) 12/23/2023 1555   MONOABS 0.3 10/20/2017 1028   EOSABS 0.1 12/23/2023 1555   BASOSABS 0.0 12/23/2023 1555    Hepatic Function      Component Value Date/Time   PROT 6.8 12/23/2023 1555   ALBUMIN 4.5 12/23/2023 1555   AST 12 12/23/2023 1555   ALT 10 12/23/2023 1555   ALKPHOS 67 12/23/2023 1555   BILITOT <0.2 12/23/2023 1555   BILIDIR 0.11 03/04/2017 1153   IBILI 0.3 02/02/2015 0944    Renal Function   Lab Results  Component Value Date   CREATININE 0.77 12/23/2023   CREATININE 0.81 08/23/2023   CREATININE 0.80 02/18/2023    CrCl cannot be calculated (Patient's most recent lab  result is older than the maximum 21 days allowed.).   VVS Vascular Lab Studies:  02/02/24 doppler FINDINGS: Contralateral Subclavian Vein: Respiratory phasicity is normal and symmetric with the symptomatic side. No evidence of thrombus. Normal compressibility.   Internal Jugular Vein: No evidence of thrombus. Normal compressibility, respiratory phasicity and response to augmentation.   Subclavian Vein: No evidence of thrombus. Normal compressibility, respiratory phasicity and response to augmentation.   Axillary Vein: No evidence of thrombus. Normal compressibility, respiratory phasicity and response to augmentation.   Cephalic Vein: No evidence of thrombus. Normal compressibility, respiratory phasicity and response to augmentation.   Basilic Vein: No evidence of thrombus. Normal compressibility, respiratory phasicity and response to augmentation.   Brachial Veins: Thrombus in the brachial vein of the upper arm. Thrombus appears occlusive.   Radial Veins: No evidence of thrombus. Normal compressibility, respiratory phasicity and response to augmentation.   Ulnar Veins: No evidence of thrombus. Normal compressibility, respiratory phasicity and response to augmentation.   Venous Reflux:  None visualized.   Other Findings: No evidence of superficial thrombophlebitis or abnormal fluid collection.   IMPRESSION: Occlusive DVT in the right brachial vein of the upper arm.  ASSESSMENT: Location of DVT: Right upper extremity Cause of DVT: provoked by a transient risk factor  Patient without prior history of DVT diagnosed with occlusive right brachial DVT on 02/02/24 s/p IV placement for colonoscopy on 01/31/24. She has been appropriately started on Eliquis  with the VTE starter pack dosing. Swelling has already significantly improved. Discussed with  Dr. Serene in the office today, no need for further imaging or intervention since DVT is only in the brachial vein. Will treat first  provoked UE DVT for a total of 3 months. Provided refills of Eliquis  today to complete this. Per patient preference, sent these to [facility] for free home delivery and set her up with copay card to reduce cost of refills. Counseled patient extensively on Eliquis . All questions at this time have been answered.   PLAN: -Continue apixaban  (Eliquis ) 5 mg twice daily. -Expected duration of therapy: 3 months. Therapy started on 02/02/24. -Patient educated on purpose, proper use and potential adverse effects of apixaban  (Eliquis ). -Discussed importance of taking medication around the same time every day. -Advised patient of medications to avoid (NSAIDs, aspirin  doses >100 mg daily). -Educated that Tylenol  (acetaminophen ) is the preferred analgesic to lower the risk of bleeding. -Advised patient to alert all providers of anticoagulation therapy prior to starting a new medication or having a procedure. -Emphasized importance of monitoring for signs and symptoms of bleeding (abnormal bruising, prolonged bleeding, nose bleeds, bleeding from gums, discolored urine, black tarry stools). -Educated patient to present to the ED if emergent signs and symptoms of new thrombosis occur.  Follow up: DVT Clinic in September for end of treatment visit  Lum Herald, PharmD, [place], CPP Deep Vein Thrombosis Clinic Clinical Pharmacist Practitioner [phone]   I have evaluated the patient's chart/imaging and refer this patient to the Clinical Pharmacist Practitioner for medication management. I have reviewed the CPP's documentation and agree with her assessment and plan. I was immediately available during the visit for questions and collaboration.   Malvina Serene, MD

## 2024-02-14 ENCOUNTER — Ambulatory Visit: Payer: PRIVATE HEALTH INSURANCE | Attending: Surgery | Admitting: Student-PharmD

## 2024-02-14 ENCOUNTER — Telehealth: Payer: Self-pay

## 2024-02-14 ENCOUNTER — Encounter: Payer: Self-pay | Admitting: Student-PharmD

## 2024-02-14 ENCOUNTER — Encounter: Payer: Self-pay | Admitting: Family Medicine

## 2024-02-14 ENCOUNTER — Other Ambulatory Visit (HOSPITAL_COMMUNITY): Payer: Self-pay

## 2024-02-14 VITALS — BP 128/94 | HR 95 | Wt 179.5 lb

## 2024-02-14 DIAGNOSIS — Z23 Encounter for immunization: Secondary | ICD-10-CM | POA: Insufficient documentation

## 2024-02-14 DIAGNOSIS — I82621 Acute embolism and thrombosis of deep veins of right upper extremity: Secondary | ICD-10-CM

## 2024-02-14 DIAGNOSIS — M79602 Pain in left arm: Secondary | ICD-10-CM | POA: Insufficient documentation

## 2024-02-14 DIAGNOSIS — E66811 Obesity, class 1: Secondary | ICD-10-CM | POA: Insufficient documentation

## 2024-02-14 MED ORDER — APIXABAN 5 MG PO TABS
5.0000 mg | ORAL_TABLET | Freq: Two times a day (BID) | ORAL | 0 refills | Status: AC
  Filled 2024-02-14 – 2024-02-28 (×3): qty 120, 60d supply, fill #0

## 2024-02-14 NOTE — Assessment & Plan Note (Signed)
 Acute pain and swelling of LUE following IV placement, refer to short stay for eval and management

## 2024-02-14 NOTE — Assessment & Plan Note (Signed)
Controlled and treated by Psych 

## 2024-02-14 NOTE — Progress Notes (Signed)
 Christina [name]     MRN: [id number]      DOB: February 09, 1976  Chief Complaint  Patient presents with   Medical Management of Chronic Issues    Follow up    Arm Pain    Pt experiencing swelling and throbbing pain in right arm since Monday after receiving IV for Colonoscopy     HPI Christina [name] is here for follow up and re-evaluation of chronic medical conditions, medication management and review of any available recent lab and radiology data.  Preventive health is updated, specifically  Cancer screening and Immunization.   C/o 3 day h/o progressive pain and swelling of right arm following insertion of an IV line while undergoing a colonoscopy. No fever or significant warmth Otherwise has been doing well Denies polyuria, polydipsia, blurred vision , or hypoglycemic episodes.   ROS Denies recent fever or chills. Denies sinus pressure, nasal congestion, ear pain or sore throat. Denies chest congestion, productive cough or wheezing. Denies chest pains, palpitations and leg swelling Denies abdominal pain, nausea, vomiting,diarrhea or constipation.   Denies dysuria, frequency, hesitancy or incontinence. Denies joint pain, swelling and limitation in mobility. Denies headaches, seizures, numbness, or tingling. Denies depression, anxiety or insomnia. Denies skin break down or rash.   PE  BP 138/88   Pulse 88   Resp 16   Ht 5' 5 (1.651 m)   Wt 183 lb (83 kg)   SpO2 97%   BMI 30.45 kg/m   Patient alert and oriented and in no cardiopulmonary distress.  HEENT: No facial asymmetry, EOMI,     Neck supple .  Chest: Clear to auscultation bilaterally.  CVS: S1, S2 no murmurs, no S3.Regular rate.  ABD: Soft non tender.   Ext: edema and tenderness of RUE from elbow to hand MS: Adequate ROM spine, shoulders, hips and knees.  Skin: Intact, no ulcerations or rash noted.  Psych: Good eye contact, normal affect. Memory intact not anxious or depressed appearing.  CNS: CN 2-12 intact, power,   normal throughout.no focal deficits noted.   Assessment & Plan  Pain and swelling of upper extremity, left Acute pain and swelling of LUE following IV placement, refer to short stay for eval and management  Type 2 diabetes mellitus with other specified complication (HCC) Diabetes associated with obesity and depression  Christina [name] is reminded of the importance of commitment to daily physical activity for 30 minutes or more, as able and the need to limit carbohydrate intake to 30 to 60 grams per meal to help with blood sugar control.   The need to take medication as prescribed, test blood sugar as directed, and to call between visits if there is a concern that blood sugar is uncontrolled is also discussed.   Christina [name] is reminded of the importance of daily foot exam, annual eye examination, and good blood sugar, blood pressure and cholesterol control.     Latest Ref Rng & Units 12/23/2023    3:55 PM 08/23/2023    8:25 AM 02/18/2023   11:27 AM 11/12/2022    2:21 PM 05/20/2022    9:41 AM  Diabetic Labs  HbA1c 4.8 - 5.6 %   7.2  8.7    Micro/Creat Ratio 0 - 29 mg/g creat    40    Chol 100 - 199 mg/dL  856  87   880   HDL >60 mg/dL  81  51   78   Calc LDL 0 - 99 mg/dL  51  22  26   Triglycerides 0 - 149 mg/dL  50  58   71   Creatinine 0.57 - 1.00 mg/dL 9.22  9.18  9.19  9.25  0.83       02/02/2024    2:49 PM 02/02/2024    1:00 PM 01/31/2024    1:19 PM 01/31/2024   11:20 AM 12/23/2023    3:08 PM 10/07/2023   11:06 AM 09/02/2023    9:00 AM  BP/Weight  Systolic BP 147 138 118 131 122 119   Diastolic BP 100 88 81 88 82 83   Wt. (Lbs)  183  170 177.2 170.4   BMI  30.45 kg/m2  28.29 kg/m2 31.39 kg/m2 28.36 kg/m2      Information is confidential and restricted. Go to Review Flowsheets to unlock data.      Latest Ref Rng & Units 12/18/2022   12:00 AM 05/05/2022    2:20 PM  Foot/eye exam completion dates  Eye Exam No Retinopathy No Retinopathy       Foot Form Completion   Done     This  result is from an external source.        Obesity (BMI 30.0-34.9)  Patient re-educated about  the importance of commitment to a  minimum of 150 minutes of exercise per week as able.  The importance of healthy food choices with portion control discussed, as well as eating regularly and within a 12 hour window most days. The need to choose clean , green food 50 to 75% of the time is discussed, as well as to make water the primary drink and set a goal of 64 ounces water daily.       02/02/2024    1:00 PM 01/31/2024   11:20 AM 12/23/2023    3:08 PM  Weight /BMI  Weight 183 lb 170 lb 177 lb 3.2 oz  Height 5' 5 (1.651 m) 5' 5 (1.651 m) 5' 3 (1.6 m)  BMI 30.45 kg/m2 28.29 kg/m2 31.39 kg/m2    Deteriorated will work on food choice and exercise  Stress due to illness of family member Has excellent mental health support, spouse is chronically ill and disabled  Bipolar 1 disorder, mixed, moderate (HCC) Controlled and treated by Psych  Essential hypertension, benign Controlled, no change in medication DASH diet and commitment to daily physical activity for a minimum of 30 minutes discussed and encouraged, as a part of hypertension management. The importance of attaining a healthy weight is also discussed.     02/02/2024    2:49 PM 02/02/2024    1:00 PM 01/31/2024    1:19 PM 01/31/2024   11:20 AM 12/23/2023    3:08 PM 10/07/2023   11:06 AM 09/02/2023    9:00 AM  BP/Weight  Systolic BP 147 138 118 131 122 119   Diastolic BP 100 88 81 88 82 83   Wt. (Lbs)  183  170 177.2 170.4   BMI  30.45 kg/m2  28.29 kg/m2 31.39 kg/m2 28.36 kg/m2      Information is confidential and restricted. Go to Review Flowsheets to unlock data.       Encounter for immunization After obtaining informed consent, the  pneumonia 20 vaccine is  administered , with no adverse effect noted at the time of administration.

## 2024-02-14 NOTE — Assessment & Plan Note (Signed)
 After obtaining informed consent, the pneumonia 20  vaccine is  administered , with no adverse effect noted at the time of administration.

## 2024-02-14 NOTE — Telephone Encounter (Signed)
 Patient Advocate Encounter  Patient preference set to mail order from [facility]. Insurance and copay card added to [facility].  Test billing under this patients current coverage ([employer]) shows that copay card would bring 30 day price down to $10.00.  60 day supply returns $20 copay, 90 day supply shows copay of $27.76, copay card will not reduce cost of 90 days as pricing is already lower than 10 per month.  Rachel DEL, CPhT Rx Patient Advocate Phone: [phone]

## 2024-02-14 NOTE — Assessment & Plan Note (Signed)
 Diabetes associated with obesity and depression  Christina [name] is reminded of the importance of commitment to daily physical activity for 30 minutes or more, as able and the need to limit carbohydrate intake to 30 to 60 grams per meal to help with blood sugar control.   The need to take medication as prescribed, test blood sugar as directed, and to call between visits if there is a concern that blood sugar is uncontrolled is also discussed.   Christina [name] is reminded of the importance of daily foot exam, annual eye examination, and good blood sugar, blood pressure and cholesterol control.     Latest Ref Rng & Units 12/23/2023    3:55 PM 08/23/2023    8:25 AM 02/18/2023   11:27 AM 11/12/2022    2:21 PM 05/20/2022    9:41 AM  Diabetic Labs  HbA1c 4.8 - 5.6 %   7.2  8.7    Micro/Creat Ratio 0 - 29 mg/g creat    40    Chol 100 - 199 mg/dL  856  87   880   HDL >60 mg/dL  81  51   78   Calc LDL 0 - 99 mg/dL  51  22   26   Triglycerides 0 - 149 mg/dL  50  58   71   Creatinine 0.57 - 1.00 mg/dL 9.22  9.18  9.19  9.25  0.83       02/02/2024    2:49 PM 02/02/2024    1:00 PM 01/31/2024    1:19 PM 01/31/2024   11:20 AM 12/23/2023    3:08 PM 10/07/2023   11:06 AM 09/02/2023    9:00 AM  BP/Weight  Systolic BP 147 138 118 131 122 119   Diastolic BP 100 88 81 88 82 83   Wt. (Lbs)  183  170 177.2 170.4   BMI  30.45 kg/m2  28.29 kg/m2 31.39 kg/m2 28.36 kg/m2      Information is confidential and restricted. Go to Review Flowsheets to unlock data.      Latest Ref Rng & Units 12/18/2022   12:00 AM 05/05/2022    2:20 PM  Foot/eye exam completion dates  Eye Exam No Retinopathy No Retinopathy       Foot Form Completion   Done     This result is from an external source.

## 2024-02-14 NOTE — Assessment & Plan Note (Signed)
  Patient re-educated about  the importance of commitment to a  minimum of 150 minutes of exercise per week as able.  The importance of healthy food choices with portion control discussed, as well as eating regularly and within a 12 hour window most days. The need to choose clean , green food 50 to 75% of the time is discussed, as well as to make water the primary drink and set a goal of 64 ounces water daily.       02/02/2024    1:00 PM 01/31/2024   11:20 AM 12/23/2023    3:08 PM  Weight /BMI  Weight 183 lb 170 lb 177 lb 3.2 oz  Height 5' 5 (1.651 m) 5' 5 (1.651 m) 5' 3 (1.6 m)  BMI 30.45 kg/m2 28.29 kg/m2 31.39 kg/m2    Deteriorated will work on food choice and exercise

## 2024-02-14 NOTE — Assessment & Plan Note (Signed)
 Has excellent mental health support, spouse is chronically ill and disabled

## 2024-02-14 NOTE — Patient Instructions (Signed)
-  Continue apixaban  (Eliquis ) 5 mg twice daily. -Your refills have been sent to [facility] for delivery. You may need to call the pharmacy to ask them to fill this when you start to run low on your current supply.  -It is important to take your medication around the same time every day.  -Avoid NSAIDs like ibuprofen  (Advil , Motrin ) and naproxen (Aleve) as well as aspirin  doses over 100 mg daily. -Tylenol  (acetaminophen ) is the preferred over the counter pain medication to lower the risk of bleeding. -Be sure to alert all of your health care providers that you are taking an anticoagulant prior to starting a new medication or having a procedure. -Monitor for signs and symptoms of bleeding (abnormal bruising, prolonged bleeding, nose bleeds, bleeding from gums, discolored urine, black tarry stools). If you have fallen and hit your head OR if your bleeding is severe or not stopping, seek emergency care.  -Go to the emergency room if emergent signs and symptoms of new clot occur (new or worse swelling and pain in an arm or leg, shortness of breath, chest pain, fast or irregular heartbeats, lightheadedness, dizziness, fainting, coughing up blood) or if you experience a significant color change (pale or blue) in the extremity that has the DVT.  -We recommend you wear compression stockings (20-30 mmHg) as long as you are having swelling or pain. Be sure to purchase the correct size and take them off at night.   If you have any questions or need to reschedule an appointment, please call [phone]. If you are having an emergency, call 911 or present to the nearest emergency room.   What is a DVT?  -Deep vein thrombosis (DVT) is a condition in which a blood clot forms in a vein of the deep venous system which can occur in the lower leg, thigh, pelvis, arm, or neck. This condition is serious and can be life-threatening if the clot travels to the arteries of the lungs and causing a blockage  (pulmonary embolism, PE). A DVT can also damage veins in the leg, which can lead to long-term venous disease, leg pain, swelling, discoloration, and ulcers or sores (post-thrombotic syndrome).  -Treatment may include taking an anticoagulant medication to prevent more clots from forming and the current clot from growing, wearing compression stockings, and/or surgical procedures to remove or dissolve the clot.

## 2024-02-14 NOTE — Assessment & Plan Note (Signed)
 Controlled, no change in medication DASH diet and commitment to daily physical activity for a minimum of 30 minutes discussed and encouraged, as a part of hypertension management. The importance of attaining a healthy weight is also discussed.     02/02/2024    2:49 PM 02/02/2024    1:00 PM 01/31/2024    1:19 PM 01/31/2024   11:20 AM 12/23/2023    3:08 PM 10/07/2023   11:06 AM 09/02/2023    9:00 AM  BP/Weight  Systolic BP 147 138 118 131 122 119   Diastolic BP 100 88 81 88 82 83   Wt. (Lbs)  183  170 177.2 170.4   BMI  30.45 kg/m2  28.29 kg/m2 31.39 kg/m2 28.36 kg/m2      Information is confidential and restricted. Go to Review Flowsheets to unlock data.

## 2024-02-15 ENCOUNTER — Ambulatory Visit: Payer: Self-pay | Admitting: Internal Medicine

## 2024-02-17 ENCOUNTER — Ambulatory Visit (INDEPENDENT_AMBULATORY_CARE_PROVIDER_SITE_OTHER): Payer: PRIVATE HEALTH INSURANCE | Admitting: Psychology

## 2024-02-17 ENCOUNTER — Encounter: Payer: Self-pay | Admitting: Psychology

## 2024-02-17 DIAGNOSIS — F319 Bipolar disorder, unspecified: Secondary | ICD-10-CM | POA: Diagnosis not present

## 2024-02-17 DIAGNOSIS — F4321 Adjustment disorder with depressed mood: Secondary | ICD-10-CM

## 2024-02-17 NOTE — Progress Notes (Signed)
 [facility] Counselor Annual Adult Exam  Name: Christina [name] Date: 02/17/2024 MRN: [id number] DOB: [birth year] PCP: Antonetta Rollene BRAVO, MD  Time spent: 1:30pm-2:19pm   Pt is seen for an in person visit.  Guardian/Payee:  self    Reason for Visit /Presenting Problem: Pt is seen for annual assessment to continue individual therapy for Bipolar 1 D/O and grief.  Pt has been established with this counselor since 2019, dx w/ Bipolar d/o in 2006 and been under the care of psychiatrist, Dr Curry.  Pt had hx of manic and depressive episodes and had previously struggled w/ maintaining w/ jobs and mood stability- elevated mood and energy followed by depressed mood and unable to engage and function.  Pt has also struggled w/ anxiety and feeling inadequate.  Pt has continues to see growth in the past several years- being consistent with her tx and self awareness.  Pt has experienced significant losses and life stressors in past several years:  father's sudden death in Sep 06, 2022husband dx w/ heart failure April 2023 and limited in functioning, pt own health struggles,pt youngest son dx of Autism Spectrum- mostly nonverbal and receiving  IEP and ABA therapy.  Pt currently on intermittent FMLA w/ husband recent shoulder surgery/family needs.     Mental Status Exam: Appearance:   Well Groomed     Behavior:  Appropriate  Motor:  Normal  Speech/Language:   Normal Rate  Affect:  Appropriate  Mood:  anxious  Thought process:  normal  Thought content:    WNL  Sensory/Perceptual disturbances:    WNL  Orientation:  oriented to person, place, time/date, and situation  Attention:  Good  Concentration:  Good  Memory:  WNL  Fund of knowledge:   Good  Insight:    Good  Judgment:   Good  Impulse Control:  Good   Reported Symptoms:  Pt endorses continue grief w/ periods of feeling down/sad.  Pt feels progress in being able to identify and acknowledge grief better.  Pt reports continues to feel  overwhelmed w/ stressors of husband's health, youngest needs, balancing work and life and extra responsibilities in the community. Pt reports increased anxiety and worry over past couple of months re: something bad happening w/her or husband's health. Pt reports she has been better about being able to express/verbalize to others.  Pt continues to struggle w/ taking on too much and needing to set better boundaries.  Pt reports difficult to acknowledge can't work towards her professional development w/ family needs.  Risk Assessment: Danger to Self:  No Self-injurious Behavior: No Danger to Others: No Duty to Warn:no Physical Aggression / Violence:No  Access to Firearms a concern: No  Gang Involvement:No  Patient / guardian was educated about steps to take if suicide or homicide risk level increases between visits: yes While future psychiatric events cannot be accurately predicted, the patient does not currently require acute inpatient psychiatric care and does not currently meet [place]  involuntary commitment criteria.  Substance Abuse History: Current substance abuse: No     Past Psychiatric History:   Previous psychological history is significant for Bipolar 1 D/O Outpatient Providers:Dr. Arfeen psychiatrist since 2006 and [place] since 2019. History of Psych Hospitalization: No  Psychological Testing: none   Abuse History:  Victim of: No., No abuse hx   Report needed: No. Victim of Neglect:No. Perpetrator of n/a  Witness / Exposure to Domestic Violence: No   Protective Services Involvement: No  Witness to [employer] Violence:  No   Family History:  Family History  Problem Relation Age of Onset   Diabetes Mother    Hypertension Mother    Fibromyalgia Mother    Stroke Mother    Diabetes Father    Hypertension Father    Asthma Father    Heart disease Father    Depression Father    Colon cancer Neg Hx    Breast cancer Neg Hx    Inflammatory bowel disease  Neg Hx   Youngest son was dx w/ Autism Spectrum D/O Dec 2022, has an IEP and has ABA therapy.  Pt and husband working to set up special needs trust for son for when he is older.  had to take break w/ not having a therapist available in their area.  Pt oldest son dx of ADHD.   Pt husband has been dx w/ heart failure since April 2023 and hx of alcohol abuse. Pt mom lives alone-currently trying to get to move closer. Her mom has hx of stroke .  Pt father died unexpectedly 03-27-2021.  Her maternal grandmother died in 06-27-2020.    Living situation: the patient lives with her husband, 17y/o son JORETTA, 10y/o daughter Lesley, 7y/o son Abby in [place], [place]  Pt grew up [place], [place]. black family with both parents master's degree and pt as only child.  Sexual Orientation: Straight  Relationship Status: married to her husband, Franky, for 20 years.  Name of spouse / other:Kevin If a parent, number of children:3 children.  Support Systems: husband, mom, friends, sorority.   Financial Stress:  Yes  w/ reduced income since husband on medical leave and increased expenses w/ medical bills.  Her husband has applied for SSDI  and waiting on decision re: recent appeal.    Income/Employment/Disability: Employment.  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  Pt in process of completing master's counseling- had to postpone w/ family stressors.   Military Service: No   Educational History: Education: post college graduate work or degree Pt has her law degree. 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  Religion/Sprituality/World View: Christian   Any cultural differences that may affect / interfere with treatment:  not applicable   Recreation/Hobbies: tennis, involvement  in sorority, time w/ family  Stressors: Educational concerns   Financial difficulties   Health problems   Loss of father in 2022, maternal gm in 2021   Other: Son's needs w/ ASD.  Husband's health. Balance work, family, school    Strengths: Supportive Relationships, Family, Friends, Charity fundraiser, Journalist, newspaper, Able to [employer], and motivated, intelligent and hard working  Barriers:  mental heath stigma   Legal History: Pending legal issue / charges: The patient has no significant history of legal issues. History of legal issue / charges: none  Medical History/Surgical History: reviewed 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   GERD (gastroesophageal reflux disease)    H/O acute sinusitis 10/2016  Hypertension    Purulent discharge from nipple 01/06/2022   Thyroid  enlargement    Wears glasses     Past Surgical History:  Procedure Laterality Date   BREAST BIOPSY Left 02/09/2022   BREAST BIOPSY Left 02/13/2022   BREAST EXCISIONAL BIOPSY Left 03/18/2022   BREAST REDUCTION SURGERY  1994   BREAST SURGERY N/A    Phreesia 08/26/2020   CESAREAN SECTION N/A 03/06/2016   Procedure: CESAREAN SECTION;  Surgeon: Rome Rigg, MD;  Location: [facility]IRTHING SUIT[facility];  Service: Obstetrics;  Laterality: N/A;   COLONOSCOPY N/A 01/31/2024   Procedure: COLONOSCOPY;  Surgeon: Cindie Carlin POUR, DO;  [facility];  Service: Endoscopy;  Laterality: N/A;  1230pm, asa 2   COLONOSCOPY WITH PROPOFOL  N/A 10/26/2017   Dr. harvey: Torturous transverse and sigmoid colon, internal hemorrhoids.  Next colonoscopy in 10 years with MAC and color wrap   dermoid tumor  2000   DILITATION &  CURRETTAGE/HYSTROSCOPY WITH NOVASURE ABLATION N/A 12/10/2016   Procedure: DILATATION & CURETTAGE/HYSTEROSCOPY WITH NOVASURE ABLATION;  Surgeon: Rigg Rome, MD;  Location: [facility];  Service: Gynecology;  Laterality: N/A;   ESOPHAGOGASTRODUODENOSCOPY  07/01/2009   DOQ:wnmfjo esphagus without barrett's/dilation with 16 mm/mild erthyema in the antrum. mild chronic gastritis on path.    ESOPHAGOGASTRODUODENOSCOPY N/A 01/31/2024   Procedure: EGD (ESOPHAGOGASTRODUODENOSCOPY);  Surgeon: Cindie Carlin POUR, DO;  [facility]: [facility];  Service: Endoscopy;  Laterality: N/A;   ESOPHAGOGASTRODUODENOSCOPY (EGD) WITH PROPOFOL  N/A 10/26/2017   Dr. harvey: Gastritis, gastric polyps.  Biopsies benign.  Small bowel biopsies negative for celiac.  No H. pylori.   HERNIA REPAIR  12/2018   RADIOACTIVE SEED GUIDED EXCISIONAL BREAST BIOPSY Left 03/18/2022   Procedure: LEFT BREAST RADIOACTIVE SEED LOCALIZED EXCISIONAL BIOPSY;  Surgeon: Belinda Cough, MD;  Location: [facility];  Service: General;  Laterality: Left;   REDUCTION MAMMAPLASTY     1994   TRIGGER FINGER RELEASE Bilateral 06/2015   TUBAL LIGATION Bilateral 03/06/2016   Procedure: BILATERAL TUBAL LIGATION;  Surgeon: Rome Rigg, MD;  Location: [facility] BIRTHING SUITES;  Service: Obstetrics;  Laterality: Bilateral;  Currently being tx for DVT dx July 2025.  Medications: Current Outpatient Medications  Medication Sig Dispense Refill   acetaminophen  (TYLENOL ) 500 MG tablet Take 1,000 mg every 8 (eight) hours as needed by mouth for mild pain.      apixaban  (ELIQUIS ) 5 MG TABS tablet Take 1 tablet (5 mg total) by mouth 2 (two) times daily. Start taking after completion of starter pack. 120 tablet 0   APIXABAN  (ELIQUIS ) VTE STARTER PACK (10MG  AND 5MG ) Take as directed on package: start with two-5mg  tablets twice daily for 7 days. On day 8, switch to one-5mg  tablet twice daily. 74 each 0   ARIPiprazole  (ABILIFY ) 5 MG tablet  Take 1 tablet (5 mg total) by mouth every evening. 90 tablet 1   azelastine  (ASTELIN ) 0.1 % nasal spray Place 2 sprays into both nostrils 2 (two) times daily. Use in each nostril as directed 30 mL 12   benzonatate  (TESSALON ) 200 MG capsule Take 200 mg by mouth 3 (three) times daily as needed for cough.     cefdinir (OMNICEF) 300 MG capsule Take 300 mg by mouth 2 (two) times daily.     Empagliflozin-metFORMIN  HCl ER (SYNJARDY  XR) 12.12-998 MG TB24 Take two tablets by mouth once daily for diabetes 60 tablet 3   fluticasone  (FLONASE ) 50 MCG/ACT nasal spray Place 2 sprays into both nostrils daily. (Patient taking differently: Place 2  sprays into both nostrils in the morning and at bedtime.) 48 mL 5   levocetirizine (XYZAL) 5 MG tablet Take 5 mg by mouth every evening.     losartan  (COZAAR ) 50 MG tablet Take 1 tablet (50 mg total) by mouth daily. 90 tablet 2   montelukast  (SINGULAIR ) 10 MG tablet Take 1 tablet (10 mg total) by mouth at bedtime. 90 tablet 2   MOUNJARO  7.5 MG/0.5ML Pen Inject 7.5 mg into the skin every Friday.     omeprazole  (PRILOSEC ) 40 MG capsule Take 1 capsule (40 mg total) by mouth daily. 30 capsule 0   ondansetron  (ZOFRAN ) 4 MG tablet Take 1 tablet (4 mg total) by mouth every 8 (eight) hours as needed for nausea or vomiting. 30 tablet 1   Probiotic Product (PROBIOTIC ADVANCED PO) Take 1 capsule by mouth 2 (two) times daily as needed (with antibiotic).     rosuvastatin  (CRESTOR ) 5 MG tablet Take one tablet by mouth two times weekly (Patient taking differently: Take 5 mg by mouth 2 (two) times a week. Mondays and Wednesdays) 32 tablet 3   No current facility-administered medications for this visit.    Allergies  Allergen Reactions   Other Anaphylaxis    Tree nuts   Peanut Oil Anaphylaxis   Peanut-Containing Drug Products Anaphylaxis   Augmentin [Amoxicillin-Pot Clavulanate] Nausea And Vomiting    Causes stomach cramps. Patient can take amoxicillin, not Augmentin.     Diagnoses:  Bipolar I disorder (HCC)  Grief  Plan of Care: Pt is a 48y/o married black female who is seeking continued counseling of Bipolar 1 d/o.  Pt has 3 children, works full time as Producer, television/film/video for [employer].  Pt has signficant stressors w/ husband's health/limitations, son's dx of ASD, her own health issues and grief/losses. Pt has been consistent w/ weekly to biweekly counseling in the past year w/ this provider and f/u w/ her psychiatrist.  Pt mood is stable w/ no manic or depressive episodes. Pt seeking continued counseling to maintain progress and support w/ continues stressors.  Pt to continue w/ weekly to biweekly counseling, f/u w/ Dr. Afreen, and her PCP and specialist for medical concerns.   Treatment Plan 02/17/23   Client Abilities/Strengths  supports: family- partner and mom, friends  positives: self awareness, expressing feelings, advocating for self. Motivated, hardworking, intelligent, family centered   Client Treatment Preferences  Weekly to biweekly counseling and continue medication management Dr. Arfeen.    Client Statement of Needs  pt  Need to continue w/ my therapy and compliance w/ my medication management for mood stability.  to continue to support me- give my safe place continue talk about what I need. I want to recognize my capacity and boundaries.  I want to articulate saying no when needed.  More comforting activities in upcoming year for self.  .    Treatment Level  outpatient counseling    Symptoms  death of father April 04, 2021.  Husband dx w/ heart failure 11/2021. Grief and anxiety. History of at least one hypomanic, manic, or mixed mood episode.SABRA History of chronic or recurrent depression for which the client has taken antidepressant medication and had outpatient treatment,     Goals 1. Continue a healthy grieving process around the losses experienced.   Objective Identify and verbalize feelings associated with the loss. Target Date:  2025-02-16 Frequency: Daily  Progress: 75 Modality: individual  Related Interventions Assist the client in identifying and expressing feelings connected with her loss.   2. Increase the  use of self care, grounding and calming skills to assist coping w/ stressors and maintaining mood Objective Maintain positive self care, use of mindfulness and other grounding activities to maintain mood stability. Target Date: 2025-02-16 Frequency: Daily  Progress: 25 Modality: individual  Related Interventions Assist the client in establishing a routine pattern of daily activities such as sleeping, eating, solitary and social activities, and exercise; use of relaxation and grounding strategies routine and assist in managing stressors.    3. Recognize capacity for engaging/assisting in responsibilities, develop healthy boundaries for self and say no when doesn't have the capacity.     Objective Identify values, boundaries for work life balance and priorities responsibilities, set boundaries with self and others and say no to requests that compromise boundaries/capacity.   Target Date: 2024-02-17 Frequency: Daily  Progress: 0 Modality: individual  Related Interventions utilize CBT strategies to assist in challenging negative thought patterns and beliefs and ACT strategies to assist in actions, decisions and interactions consistent w/values.   This plan has been reviewed and created by the following participants:  This plan will be reviewed at least every 12 months. Date Behavioral Health Clinician Date Guardian/Patient   02/17/24 [facility] [name] [facility]       02/02/24 Verbal Consent Provided and electronic signature obtained.                [name] APPL, LCMHC

## 2024-02-22 ENCOUNTER — Ambulatory Visit: Payer: PRIVATE HEALTH INSURANCE | Admitting: Psychology

## 2024-02-28 ENCOUNTER — Other Ambulatory Visit: Payer: Self-pay

## 2024-03-01 ENCOUNTER — Encounter (HOSPITAL_COMMUNITY): Payer: Self-pay | Admitting: Psychiatry

## 2024-03-01 ENCOUNTER — Telehealth (HOSPITAL_BASED_OUTPATIENT_CLINIC_OR_DEPARTMENT_OTHER): Payer: PRIVATE HEALTH INSURANCE | Admitting: Psychiatry

## 2024-03-01 DIAGNOSIS — F419 Anxiety disorder, unspecified: Secondary | ICD-10-CM

## 2024-03-01 DIAGNOSIS — F3162 Bipolar disorder, current episode mixed, moderate: Secondary | ICD-10-CM | POA: Diagnosis not present

## 2024-03-01 MED ORDER — ARIPIPRAZOLE 5 MG PO TABS: 5.0000 mg | ORAL_TABLET | Freq: Every evening | ORAL | 1 refills | Status: DC

## 2024-03-01 NOTE — Progress Notes (Signed)
 [facility] MD Virtual Progress Note   Patient Location: In Car Provider Location: Home Office  I connect with patient by video and verified that I am speaking with correct person by using two identifiers. I discussed the limitations of evaluation and management by telemedicine and the availability of in person appointments. I also discussed with the patient that there may be a patient responsible charge related to this service. The patient expressed understanding and agreed to proceed.  Christina [name] [id number] 48 y.o.  03/01/2024 8:36 AM  History of Present Illness:  Patient is evaluated by video session.  She reported things are going well.  She is taking care of her husband who recently had surgery and she has to take time off to help him.  She reported life is busy as usual as taking care of the kids on the husband.  Her job is sometimes challenging.  Recently she find out that one of the grand was approved but other grant was not approved.  She is sleeping good.  She denies any irritability, mania, psychosis or any highs and lows.  She feels the Abilify  keeping her stable.  Recently she was very anxious and concerned because after the colonoscopy she had a swelling in her legs and found to have she has a blood clot and required emergency visit to the ER.  However she handled the situation better.  She is not on blood thinner until October.  She reported her hemoglobin A1c stable at 6.4.  She is on Mounjaro  and that is helping her weight and keeping her blood sugars stable.  Patient lives with her husband and 3 children who are 81 year old, 56 year old and 2-year-old who has autism.  Patient denies any panic attack.  She denies any tremors, shakes or any EPS.  She denies any paranoia or any hallucination.  She like to keep the Abilify  which is keeping her mood stable.  Past Psychiatric History: H/O depression, anxiety, mania and psychosis.  Admitted in 1997 at [facility] due to suicidal  gestures.  Seen in this office since 2006. Tried Prozac and Wellbutrin .     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    apixaban  (ELIQUIS ) 5 MG TABS tablet Take 1 tablet (5 mg total) by mouth 2 (two) times daily. Start taking after completion of starter pack.   APIXABAN  (ELIQUIS ) VTE STARTER PACK (10MG  AND 5MG ) Take as directed on package: start with two-5mg  tablets twice daily for 7 days. On day 8, switch to one-5mg  tablet twice daily.   ARIPiprazole  (ABILIFY ) 5 MG tablet Take 1 tablet (5 mg total) by mouth every evening.   azelastine  (ASTELIN ) 0.1 % nasal spray Place 2 sprays into both nostrils 2 (two) times daily. Use in each nostril as directed  Empagliflozin-metFORMIN  HCl ER (SYNJARDY  XR) 12.12-998 MG TB24 Take two tablets by mouth once daily for diabetes   fluticasone  (FLONASE ) 50 MCG/ACT nasal spray Place 2 sprays into both nostrils daily. (Patient taking differently: Place 2 sprays into both nostrils in the morning and at bedtime.)   levocetirizine (XYZAL) 5 MG tablet Take 5 mg by mouth every evening.   losartan  (COZAAR ) 50 MG tablet Take 1 tablet (50 mg total) by mouth daily.   montelukast  (SINGULAIR ) 10 MG tablet Take 1 tablet (10 mg total) by mouth at bedtime.   MOUNJARO  7.5 MG/0.5ML Pen Inject 7.5 mg into the skin every Friday.   omeprazole  (PRILOSEC ) 40 MG capsule  Take 1 capsule (40 mg total) by mouth daily.   ondansetron  (ZOFRAN ) 4 MG tablet Take 1 tablet (4 mg total) by mouth every 8 (eight) hours as needed for nausea or vomiting.   Probiotic Product (PROBIOTIC ADVANCED PO) Take 1 capsule by mouth 2 (two) times daily as needed (with antibiotic).   rosuvastatin  (CRESTOR ) 5 MG tablet Take one tablet by mouth two times weekly (Patient taking differently: Take 5 mg by mouth 2 (two) times a week. Mondays and Wednesdays)   No facility-administered encounter medications on file as of 03/01/2024.    Recent Results (from the past 2160 hours)  CBC w/Diff/Platelet     Status: Abnormal   Collection Time: 12/23/23  3:55 PM  Result Value Ref Range   WBC 5.2 3.4 - 10.8 x10E3/uL   RBC 4.23 3.77 - 5.28 x10E6/uL   Hemoglobin 10.9 (L) 11.1 - 15.9 g/dL   Hematocrit 63.1 65.9 - 46.6 %   MCV 87 79 - 97 fL   MCH 25.8 (L) 26.6 - 33.0 pg   MCHC 29.6 (L) 31.5 - 35.7 g/dL   RDW 85.1 88.2 - 84.5 %   Platelets 397 150 - 450 x10E3/uL   Neutrophils 31 Not Estab. %   Lymphs 59 Not Estab. %   Monocytes 7 Not Estab. %   Eos 2 Not Estab. %   Basos 1 Not Estab. %   Neutrophils Absolute 1.6 1.4 - 7.0 x10E3/uL   Lymphocytes Absolute 3.2 (H) 0.7 - 3.1 x10E3/uL   Monocytes Absolute 0.3 0.1 - 0.9 x10E3/uL   EOS (ABSOLUTE) 0.1 0.0 - 0.4 x10E3/uL   Basophils Absolute 0.0 0.0 - 0.2 x10E3/uL   Immature Granulocytes 0 Not Estab. %   Immature Grans (Abs) 0.0 0.0 - 0.1 x10E3/uL  C-reactive protein     Status: None   Collection Time: 12/23/23  3:55 PM  Result Value Ref Range   CRP 2 0 - 10 mg/L  Calprotectin, Fecal     Status: None   Collection Time: 12/23/23  3:55 PM   Specimen: Stool  Result Value Ref Range   Calprotectin, Fecal CANCELED ug/g    Comment: Test not performed. No stool specimen received. Concentration     Interpretation   Follow-Up < 5 - 50 ug/g     Normal           None >50 -120 ug/g     Borderline       Re-evaluate in 4-6 weeks     >120 ug/g     Abnormal          Repeat as clinically                                    indicated  Result  canceled by the ancillary.   CMP14+EGFR     Status: None   Collection Time: 12/23/23  3:55 PM  Result Value Ref Range   Glucose 83 70 - 99 mg/dL   BUN 9 6 - 24 mg/dL   Creatinine, Ser 9.22 0.57 - 1.00 mg/dL   eGFR 95 >40 fO/fpw/8.26   BUN/Creatinine Ratio 12 9 - 23   Sodium 139 134 - 144 mmol/L   Potassium 4.4 3.5 - 5.2 mmol/L   Chloride 101 96 - 106 mmol/L   CO2 22 20 - 29 mmol/L   Calcium  10.0 8.7 - 10.2 mg/dL   Total Protein 6.8 6.0 - 8.5 g/dL   Albumin 4.5 3.9 - 4.9 g/dL   Globulin, Total 2.3 1.5 - 4.5 g/dL   Bilirubin Total <9.7 0.0 - 1.2 mg/dL   Alkaline Phosphatase 67 44 - 121 IU/L   AST 12 0 - 40 IU/L   ALT 10 0 - 32 IU/L  Calprotectin, Fecal     Status: None   Collection Time: 12/30/23 10:00 AM   Specimen: Stool  Result Value Ref Range   Calprotectin, Fecal 47 0 - 120 ug/g    Comment: Concentration     Interpretation   Follow-Up < 5 - 50 ug/g     Normal           None >50 -120 ug/g     Borderline       Re-evaluate in 4-6 weeks     >120 ug/g     Abnormal         Repeat as clinically                                    indicated   Pregnancy, urine POC     Status: None   Collection Time: 01/31/24 10:42 AM  Result Value Ref Range   Preg Test, Ur NEGATIVE NEGATIVE    Comment:        THE SENSITIVITY OF THIS METHODOLOGY IS >20 mIU/mL.   Glucose, capillary     Status: Abnormal   Collection Time: 01/31/24 12:20 PM  Result Value Ref Range   Glucose-Capillary 62 (L) 70 - 99 mg/dL    Comment: Glucose reference range applies only to samples taken after fasting for at least 8 hours.  Surgical pathology     Status: None   Collection Time: 01/31/24 12:43 PM  Result Value Ref Range   SURGICAL PATHOLOGY      SURGICAL PATHOLOGY CASE: [id number] PATIENT: [facility] Surgical Pathology Report     Clinical History: Nausea, vomiting, diarrhea (las)     FINAL MICROSCOPIC DIAGNOSIS:  A.  GASTRIC BIOPSY: Reactive gastropathy with minimal chronic gastritis Negative for H. pylori, intestinal metaplasia, dysplasia and carcinoma  B. COLON, RANDOM, BIOPSY: Benign colonic mucosa with no diagnostic abnormality   GROSS DESCRIPTION:  A.  Received in formalin are tan, soft tissue fragments that are submitted in toto. Number: 4 size: 0.2-0.3 cm blocks: 1  B.  Received in formalin are tan, soft tissue fragments that are submitted in toto. Number: 6 size: 0.2 cm, each blocks: 1 (KW, 02/01/2024)    Final Diagnosis performed by Prentice Pitcher, MD.   Electronically signed 02/02/2024 Technical component performed at [facility], [address] [place], [place] [address].  Professional component performed at [employer]. [facility], [address], [place], [place] [address].  Immunohistochemistry  Technical component (if applicable) was performed at [facility]. [address], [place], [place] [address].   IMMUNOHISTOCHEMISTRY DISCLAIMER (if applicable): Some of these immunohistochemical stains may have been developed and the performance characteristics determine by [facility]. Some may not have been cleared or approved by the U.S. Food and Drug Administration. The FDA has determined that such clearance or approval is not necessary. This test is used for clinical purposes. It should not be regarded as investigational or for research. This laboratory is certified under the Clinical Laboratory Improvement Amendments of 1988 (CLIA-88) as qualified to perform high complexity clinical laboratory testing.  The controls stained appropriately.   IHC stains are performed on formalin fixed, paraffin embedded tissue using a 3,3diaminobenzidine (DAB) chromogen and Leica Bond Autostaine r System. The staining intensity of the nucleus is score manually and is reported as the percentage of tumor cell nuclei demonstrating  specific nuclear staining. The specimens are fixed in 10% Neutral Formalin for at least 6 hours and up to 72hrs. These tests are validated on decalcified tissue. Results should be interpreted with caution given the possibility of false negative results on decalcified specimens. Antibody Clones are as follows ER-clone 42F, PR-clone 16, Ki67- clone MM1. Some of these immunohistochemical stains may have been developed and the performance characteristics determined by [facility].   Glucose, capillary     Status: None   Collection Time: 01/31/24  1:21 PM  Result Value Ref Range   Glucose-Capillary 97 70 - 99 mg/dL    Comment: Glucose reference range applies only to samples taken after fasting for at least 8 hours.     Psychiatric Specialty Exam: Physical Exam  Review of Systems  Weight 179 lb (81.2 kg).There is no height or weight on file to calculate BMI.  General Appearance: Casual  Eye Contact:  Good  Speech:  Clear and Coherent and Normal Rate  Volume:  Normal  Mood:  Euthymic  Affect:  Congruent  Thought Process:  Goal Directed  Orientation:  Full (Time, Place, and Person)  Thought Content:  WDL  Suicidal Thoughts:  No  Homicidal Thoughts:  No  Memory:  Immediate;   Good Recent;   Good Remote;   Good  Judgement:  Good  Insight:  Present  Psychomotor Activity:  Normal  Concentration:  Concentration: Good and Attention Span: Good  Recall:  Good  Fund of Knowledge:  Good  Language:  Good  Akathisia:  No  Handed:  Right  AIMS (if indicated):     Assets:  Communication Skills Desire for Improvement Housing Resilience Social Support Talents/Skills Transportation  ADL's:  Intact  Cognition:  WNL  Sleep:  ok       02/02/2024    1:01 PM 06/30/2023    1:38 PM 02/17/2023   10:50 AM 11/11/2022   11:37 AM 05/05/2022    2:31 PM  Depression screen PHQ 2/9  Decreased Interest 0 0 0 0 0  Down, Depressed, Hopeless 0 0 0 0 0  PHQ - 2 Score 0 0 0 0 0     Assessment/Plan: Anxiety - Plan: ARIPiprazole  (ABILIFY ) 5 MG tablet  Bipolar 1 disorder, mixed, moderate (HCC) - Plan: ARIPiprazole  (ABILIFY ) 5 MG tablet   Patient is 48 year old African-American female with history of diabetes, hypertension, recently had a blood clot after colonoscopy procedure.  I reviewed collateral information and blood work results.  Hemoglobin A1c 6.4.  She has no major concerns or side effects from Abilify .  Continue 5  mg Abilify  daily.  She like to have her prescription [facility] to [facility].  I recommend to call back if she has any question or any concern.  Will follow-up in 6 months.     Follow Up Instructions:     I discussed the assessment and treatment plan with the patient. The patient was provided an opportunity to ask questions and all were answered. The patient agreed with the plan and demonstrated an understanding of the instructions.   The patient was advised to call back or seek an in-person evaluation if the symptoms worsen or if the condition fails to improve as anticipated.    Collaboration of Care: Other provider involved in patient's care AEB notes are available in epic to review  Patient/Guardian was advised Release of Information must be obtained prior to any record release in order to collaborate their care with an outside provider. Patient/Guardian was advised if they have not already done so to contact the registration department to sign all necessary forms in order for us  to release information regarding their care.   Consent: Patient/Guardian gives verbal consent for treatment and assignment of benefits for services provided during this visit. Patient/Guardian expressed understanding and agreed to proceed.     Total encounter time 23 minutes which includes face-to-face time, chart reviewed, care coordination, order entry and documentation during this encounter.   Note: This document was prepared by [employer] voice dictation technology  and any errors that results from this process are unintentional.    Leni ONEIDA Client, MD 03/01/2024

## 2024-03-02 ENCOUNTER — Ambulatory Visit: Payer: PRIVATE HEALTH INSURANCE | Admitting: Psychology

## 2024-03-02 DIAGNOSIS — F319 Bipolar disorder, unspecified: Secondary | ICD-10-CM | POA: Diagnosis not present

## 2024-03-02 NOTE — Progress Notes (Addendum)
 [facility] Counselor/Therapist Progress Note  Patient ID: Christina [name], MRN: [id number],    Date: 03/02/2024  Time Spent: 10:00am-10:50am  Treatment Type: Individual Therapy  Pt is seen for an in person visit.  Reported Symptoms:  Pt reports worries, but overall less anxious.  Pt reports able to say no- assert boundaries for self.     Mental Status Exam: Appearance:  Well Groomed     Behavior: Appropriate  Motor: Normal  Speech/Language:  Normal Rate  Affect: Appropriate  Mood: anxious  Thought process: normal  Thought content:   WNL  Sensory/Perceptual disturbances:   WNL  Orientation: oriented to person, place, time/date, and situation  Attention: Good  Concentration: Good  Memory: WNL  Fund of knowledge:  Good  Insight:   Good  Judgment:  Good  Impulse Control: Good   Risk Assessment: Danger to Self:  No Self-injurious Behavior: No Danger to Others: No Duty to Warn:no Physical Aggression / Violence:No   Subjective: Counselor assessed pt current functioning per pt report.  Processed w/pt positives, stressor and emotions.  Reflected awareness and insight for self and utilizing her supports and setting boundaries for self.  Discussed values and advocacy and ways to participate and still have balance for self.   Pt affect wnl.  Pt reports some worries w/ needs and supporting kids.  Pt reports overall less anxiety and not as overwhelmed.  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  Pt reports she recognizing advocacy need sfo  son and way her and husband can participate.  Pt discussed f/u w/ daughter's and son's health needs.    Interventions: Cognitive Behavioral Therapy and boundaries and supportive  Diagnosis:Bipolar I disorder (HCC)  Plan: Pt to f/u w/ counseling in 1-2 week.   Pt to f/u w/ Dr. Arfeen as scheduled.     Treatment Plan 02/17/24   Client Abilities/Strengths  supports: family- partner and  mom, friends  positives: self awareness, expressing feelings, advocating for self. Motivated, hardworking, intelligent, family centered   Client Treatment Preferences  Weekly to biweekly counseling and continue medication management Dr. Arfeen.    Client Statement of Needs  pt  Need to continue w/ my therapy and compliance w/ my medication management for mood stability.  to continue to support me- give my safe place continue talk about what I need. I want to recognize my capacity and boundaries.  I want to articulate saying no when needed.  More comforting activities in upcoming year for self.  .    Treatment Level  outpatient counseling    Symptoms  death of father 03/22/21.  Husband dx w/ heart failure 11/2021. Grief and anxiety. History of at least one hypomanic, manic, or mixed mood episode.SABRA History of chronic or recurrent depression for which the client has taken antidepressant medication and had outpatient treatment,     Goals 1. Continue a healthy grieving process around the losses experienced.   Objective Identify and verbalize feelings associated with the loss. Target Date: 2025-02-16 Frequency: Daily  Progress: 75 Modality: individual  Related Interventions Assist the client in identifying and expressing feelings connected with her loss.   2. Increase the use of self care, grounding and calming skills to assist coping w/ stressors and maintaining mood Objective Maintain positive self care, use of mindfulness and other grounding activities to maintain mood stability. Target Date: 2025-02-16 Frequency: Daily  Progress: 25 Modality: individual  Related Interventions Assist the  client in establishing a routine pattern of daily activities such as sleeping, eating, solitary and social activities, and exercise; use of relaxation and grounding strategies routine and assist in managing stressors.    3. Recognize capacity for engaging/assisting in responsibilities, develop healthy  boundaries for self and say no when doesn't have the capacity.     Objective Identify values, boundaries for work life balance and priorities responsibilities, set boundaries with self and others and say no to requests that compromise boundaries/capacity.   Target Date: 2025-02-16 Frequency: Daily  Progress: 0 Modality: individual  Related Interventions utilize CBT strategies to assist in challenging negative thought patterns and beliefs and ACT strategies to assist in actions, decisions and interactions consistent w/values.   This plan has been reviewed and created by the following participants:  This plan will be reviewed at least every 12 months. Date Behavioral Health Clinician Date Guardian/Patient   02/17/24 [name]       02/02/24 Verbal Consent Provided and electronic signature obtained.                Brittan Butterbaugh, [facility]               [place], LCMHC

## 2024-03-06 ENCOUNTER — Encounter (HOSPITAL_COMMUNITY): Payer: PRIVATE HEALTH INSURANCE

## 2024-03-08 ENCOUNTER — Ambulatory Visit (INDEPENDENT_AMBULATORY_CARE_PROVIDER_SITE_OTHER): Payer: PRIVATE HEALTH INSURANCE | Admitting: Psychology

## 2024-03-08 DIAGNOSIS — F419 Anxiety disorder, unspecified: Secondary | ICD-10-CM | POA: Diagnosis not present

## 2024-03-08 DIAGNOSIS — F319 Bipolar disorder, unspecified: Secondary | ICD-10-CM

## 2024-03-08 NOTE — Progress Notes (Signed)
 [facility] Counselor/Therapist Progress Note  Patient ID: Christina [name], MRN: [id number],    Date: 03/08/2024  Time Spent: 4:40pm-5:18pm  Treatment Type: Individual Therapy  Pt is seen for a virtual video visit via caregility.  Pt consents to telehealth session and is aware of limitations of telehealth visits. pt joins from her home, reporting privacy, and counselor from her home office.  Reported Symptoms:  Pt worries and feeling guilt.    Mental Status Exam: Appearance:  Well Groomed     Behavior: Appropriate  Motor: Normal  Speech/Language:  Normal Rate  Affect: Appropriate  Mood: anxious  Thought process: normal  Thought content:   WNL  Sensory/Perceptual disturbances:   WNL  Orientation: oriented to person, place, time/date, and situation  Attention: Good  Concentration: Good  Memory: WNL  Fund of knowledge:  Good  Insight:   Good  Judgment:  Good  Impulse Control: Good   Risk Assessment: Danger to Self:  No Self-injurious Behavior: No Danger to Others: No Duty to Warn:no Physical Aggression / Violence:No   Subjective: Counselor assessed pt current functioning per pt report.  Processed w/pt stressors and emotions.  Explored worries and guilt and related distortions.  Assisted w/ acknowledging emotions and seeing how is caring/parenting/juggling and not perfect.  Reiterated need for boundaries and not taking on more.   Pt affect wnl.  Pt reports busy couple of days and family hit w/ 2 close tragedies/death of son's friend and family friend's daughter.  Pt discussed recent knowledge re: son academic progress and struggles and feeling guilt that hadn't been aware/followed up on things.  Pt able to see distortions contributing to guilt.  Pt discussed in past couple days recognizing a lot to keep track of and only able to do so much.    Interventions: Cognitive Behavioral Therapy and boundaries and supportive  Diagnosis:Bipolar I disorder  (HCC)  Anxiety  Plan: Pt to f/u w/ counseling in 1-2 week.   Pt to f/u w/ Dr. Arfeen as scheduled.     Treatment Plan 02/17/24   Client Abilities/Strengths  supports: family- partner and mom, friends  positives: self awareness, expressing feelings, advocating for self. Motivated, hardworking, intelligent, family centered   Client Treatment Preferences  Weekly to biweekly counseling and continue medication management Dr. Arfeen.    Client Statement of Needs  pt  Need to continue w/ my therapy and compliance w/ my medication management for mood stability.  to continue to support me- give my safe place continue talk about what I need. I want to recognize my capacity and boundaries.  I want to articulate saying no when needed.  More comforting activities in upcoming year for self.  .    Treatment Level  outpatient counseling    Symptoms  death of father 09-Apr-2021.  Husband dx w/ heart failure 11/2021. Grief and anxiety. History of at least one hypomanic, manic, or mixed mood episode.SABRA History of chronic or recurrent depression for which the client has taken antidepressant medication and had outpatient treatment,     Goals 1. Continue a healthy grieving process around the losses experienced.   Objective Identify and verbalize feelings associated with the loss. Target Date: 2025-02-16 Frequency: Daily  Progress: 75 Modality: individual  Related Interventions Assist the client in identifying and expressing feelings connected with her loss.   2. Increase the use of self care, grounding and calming skills to assist coping w/ stressors and maintaining mood Objective Maintain positive self care, use of mindfulness  and other grounding activities to maintain mood stability. Target Date: 2025-02-16 Frequency: Daily  Progress: 25 Modality: individual  Related Interventions Assist the client in establishing a routine pattern of daily activities such as sleeping, eating, solitary and social  activities, and exercise; use of relaxation and grounding strategies routine and assist in managing stressors.    3. Recognize capacity for engaging/assisting in responsibilities, develop healthy boundaries for self and say no when doesn't have the capacity.     Objective Identify values, boundaries for work life balance and priorities responsibilities, set boundaries with self and others and say no to requests that compromise boundaries/capacity.   Target Date: 2025-02-16 Frequency: Daily  Progress: 0 Modality: individual  Related Interventions utilize CBT strategies to assist in challenging negative thought patterns and beliefs and ACT strategies to assist in actions, decisions and interactions consistent w/values.   This plan has been reviewed and created by the following participants:  This plan will be reviewed at least every 12 months. Date Behavioral Health Clinician Date Guardian/Patient   02/17/24 [name]       02/17/24 Verbal Consent Provided and electronic signature obtained.                [name], LCMHC

## 2024-03-09 ENCOUNTER — Other Ambulatory Visit (HOSPITAL_COMMUNITY): Payer: Self-pay | Admitting: Family Medicine

## 2024-03-09 ENCOUNTER — Encounter (HOSPITAL_COMMUNITY): Payer: Self-pay

## 2024-03-09 DIAGNOSIS — Z1231 Encounter for screening mammogram for malignant neoplasm of breast: Secondary | ICD-10-CM

## 2024-03-16 ENCOUNTER — Encounter: Payer: Self-pay | Admitting: Gastroenterology

## 2024-03-16 ENCOUNTER — Ambulatory Visit (HOSPITAL_COMMUNITY)
Admission: RE | Admit: 2024-03-16 | Discharge: 2024-03-16 | Disposition: A | Discharge status: Home / Self Care | Payer: PRIVATE HEALTH INSURANCE | Source: Ambulatory Visit | Attending: Family Medicine | Admitting: Family Medicine

## 2024-03-16 DIAGNOSIS — Z1231 Encounter for screening mammogram for malignant neoplasm of breast: Secondary | ICD-10-CM | POA: Insufficient documentation

## 2024-03-23 ENCOUNTER — Ambulatory Visit: Payer: PRIVATE HEALTH INSURANCE | Admitting: Psychology

## 2024-03-24 DIAGNOSIS — Z1231 Encounter for screening mammogram for malignant neoplasm of breast: Secondary | ICD-10-CM

## 2024-03-27 ENCOUNTER — Ambulatory Visit: Payer: PRIVATE HEALTH INSURANCE | Admitting: Gastroenterology

## 2024-03-29 ENCOUNTER — Ambulatory Visit (INDEPENDENT_AMBULATORY_CARE_PROVIDER_SITE_OTHER): Payer: PRIVATE HEALTH INSURANCE | Admitting: Psychology

## 2024-03-29 DIAGNOSIS — F319 Bipolar disorder, unspecified: Secondary | ICD-10-CM | POA: Diagnosis not present

## 2024-03-29 DIAGNOSIS — F4321 Adjustment disorder with depressed mood: Secondary | ICD-10-CM | POA: Diagnosis not present

## 2024-03-29 NOTE — Progress Notes (Signed)
 [facility] Counselor/Therapist Progress Note  Patient ID: Christina [name], MRN: [id number],    Date: 03/29/2024  Time Spent: 4:32pm-5:07pm  Treatment Type: Individual Therapy  Pt is seen for a virtual video visit via caregility.  Pt consents to telehealth session and is aware of limitations of telehealth visits. pt joins from her home, reporting privacy, and counselor from her home office.  Reported Symptoms:  Pt reports increased grief, fatigue this week.  Pt reports being intentional about her self care.    Mental Status Exam: Appearance:  Well Groomed     Behavior: Appropriate  Motor: Normal  Speech/Language:  Normal Rate  Affect: Appropriate  Mood: sad  Thought process: normal  Thought content:   WNL  Sensory/Perceptual disturbances:   WNL  Orientation: oriented to person, place, time/date, and situation  Attention: Good  Concentration: Good  Memory: WNL  Fund of knowledge:  Good  Insight:   Good  Judgment:  Good  Impulse Control: Good   Risk Assessment: Danger to Self:  No Self-injurious Behavior: No Danger to Others: No Duty to Warn:no Physical Aggression / Violence:No   Subjective: Counselor assessed pt current functioning per pt report.  Processed w/pt stressors w/ transition of kids back to school and anniversary of father death.  Explored and validated emotions.  Discussed ways she is planning for self care and not taking on too much.  Pt affect wnl.  Pt reports stressors of back to school and feeling more busy/things to keep track of. Pt recognized that struggled yesterday and today recognized anniversary of day of father's funeral.  Pt gave self permission to not expect as much from self today.  Pt is recognizing when needs to step back and ways able to set boundary for self to not attend a meeting in past week.  Pt discussed plans for more movement and ways she has been working into work day.     Interventions: Cognitive Behavioral Therapy and  boundaries and supportive  Diagnosis:Bipolar I disorder (HCC)  Grief  Plan: Pt to f/u w/ counseling in 1-2 week.   Pt to f/u w/ Dr. Arfeen as scheduled.     Treatment Plan 02/17/24   Client Abilities/Strengths  supports: family- partner and mom, friends  positives: self awareness, expressing feelings, advocating for self. Motivated, hardworking, intelligent, family centered   Client Treatment Preferences  Weekly to biweekly counseling and continue medication management Dr. Arfeen.    Client Statement of Needs  pt  Need to continue w/ my therapy and compliance w/ my medication management for mood stability.  to continue to support me- give my safe place continue talk about what I need. I want to recognize my capacity and boundaries.  I want to articulate saying no when needed.  More comforting activities in upcoming year for self.  .    Treatment Level  outpatient counseling    Symptoms  death of father 04/02/2021.  Husband dx w/ heart failure 11/2021. Grief and anxiety. History of at least one hypomanic, manic, or mixed mood episode.SABRA History of chronic or recurrent depression for which the client has taken antidepressant medication and had outpatient treatment,     Goals 1. Continue a healthy grieving process around the losses experienced.   Objective Identify and verbalize feelings associated with the loss. Target Date: 2025-02-16 Frequency: Daily  Progress: 75 Modality: individual  Related Interventions Assist the client in identifying and expressing feelings connected with her loss.   2. Increase the use of  self care, grounding and calming skills to assist coping w/ stressors and maintaining mood Objective Maintain positive self care, use of mindfulness and other grounding activities to maintain mood stability. Target Date: 2025-02-16 Frequency: Daily  Progress: 25 Modality: individual  Related Interventions Assist the client in establishing a routine pattern of daily  activities such as sleeping, eating, solitary and social activities, and exercise; use of relaxation and grounding strategies routine and assist in managing stressors.    3. Recognize capacity for engaging/assisting in responsibilities, develop healthy boundaries for self and say no when doesn't have the capacity.     Objective Identify values, boundaries for work life balance and priorities responsibilities, set boundaries with self and others and say no to requests that compromise boundaries/capacity.   Target Date: 2025-02-16 Frequency: Daily  Progress: 0 Modality: individual  Related Interventions utilize CBT strategies to assist in challenging negative thought patterns and beliefs and ACT strategies to assist in actions, decisions and interactions consistent w/values.   This plan has been reviewed and created by the following participants:  This plan will be reviewed at least every 12 months. Date Behavioral Health Clinician Date Guardian/Patient   02/17/24 [facility] [name] [facility]       02/17/24 Verbal Consent Provided and electronic signature obtained.                   [name] APPL, LCMHC

## 2024-04-04 ENCOUNTER — Other Ambulatory Visit: Payer: Self-pay | Admitting: Family Medicine

## 2024-04-04 DIAGNOSIS — J309 Allergic rhinitis, unspecified: Secondary | ICD-10-CM

## 2024-04-12 ENCOUNTER — Ambulatory Visit: Payer: PRIVATE HEALTH INSURANCE | Admitting: Psychology

## 2024-04-12 ENCOUNTER — Ambulatory Visit: Payer: PRIVATE HEALTH INSURANCE | Admitting: Gastroenterology

## 2024-04-25 ENCOUNTER — Ambulatory Visit: Payer: PRIVATE HEALTH INSURANCE | Admitting: Student-PharmD

## 2024-04-26 ENCOUNTER — Ambulatory Visit: Payer: PRIVATE HEALTH INSURANCE | Admitting: Gastroenterology

## 2024-04-27 ENCOUNTER — Ambulatory Visit (INDEPENDENT_AMBULATORY_CARE_PROVIDER_SITE_OTHER): Payer: PRIVATE HEALTH INSURANCE | Admitting: Psychology

## 2024-04-27 DIAGNOSIS — F4321 Adjustment disorder with depressed mood: Secondary | ICD-10-CM

## 2024-04-27 DIAGNOSIS — F319 Bipolar disorder, unspecified: Secondary | ICD-10-CM

## 2024-04-27 NOTE — Progress Notes (Signed)
 [facility] Counselor/Therapist Progress Note  Patient ID: Christina [name], MRN: [id number],    Date: 04/27/2024  Time Spent: 8:00am-8:47am  Treatment Type: Individual Therapy  Pt is seen for a virtual video visit via caregility.  Pt consents to telehealth session and is aware of limitations of telehealth visits. pt joins from her home, reporting privacy, and counselor from her home office.  Reported Symptoms:  Pt reports feeling very overwhelmed.  Pt recognizing need to reduce commitments and need for her self care walks.    Mental Status Exam: Appearance:  Well Groomed     Behavior: Appropriate  Motor: Normal  Speech/Language:  Normal Rate  Affect: Appropriate  Mood: anxious  Thought process: normal  Thought content:   WNL  Sensory/Perceptual disturbances:   WNL  Orientation: oriented to person, place, time/date, and situation  Attention: Good  Concentration: Good  Memory: WNL  Fund of knowledge:  Good  Insight:   Good  Judgment:  Good  Impulse Control: Good   Risk Assessment: Danger to Self:  No Self-injurious Behavior: No Danger to Others: No Duty to Warn:no Physical Aggression / Violence:No   Subjective: Counselor assessed pt current functioning per pt report.  Processed w/pt feeling overwhelmed and identifying contributing factors.   Explored husband's recent disability determination and how feeding thoughts/worries.  Assisted pt w/ reframing.  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  Assisted pt w/ evaluating whether capable of committing to everything at this time.  pt affect wnl.  Pt reports feeling very overwhelmed recent.  Pt discussed how she is doing a lot in various areas in life and that on some boards that doesn't feel effective anymore as not the time/energy towards.  Pt discussed importance w/ family commitments valued and that too much to expect can do all the others.  Pt  identified that husband's disability determination has created anxiety/worry as has equated to being really sick/dying.  Pt recognizes that doesn't mean this but also the reality that his illness won't improve.  Pt discussed need to take care of self and make time for walks/ways to destress.  Pt identified walking group that can go to once a week after work and wants to make effort to try in next couple of weeks.    Interventions: Cognitive Behavioral Therapy and boundaries and supportive  Diagnosis:Bipolar I disorder (HCC)  Grief  Plan: Pt to f/u w/ counseling in 1-2 week.   Pt to f/u w/ Dr. Arfeen as scheduled.     Treatment Plan 02/17/24   Client Abilities/Strengths  supports: family- partner and mom, friends  positives: self awareness, expressing feelings, advocating for self. Motivated, hardworking, intelligent, family centered   Client Treatment Preferences  Weekly to biweekly counseling and continue medication management Dr. Arfeen.    Client Statement of Needs  pt  Need to continue w/ my therapy and compliance w/ my medication management for mood stability.  to continue to support me- give my safe place continue talk about what I need. I want to recognize my capacity and boundaries.  I want to articulate saying no when needed.  More comforting activities in upcoming year for self.  .    Treatment Level  outpatient counseling    Symptoms  death of father 2021-04-19.  Husband dx w/ heart failure 11/2021. Grief and anxiety. History of at least one hypomanic, manic, or mixed mood episode.SABRA History of chronic or recurrent depression for which the client has taken  antidepressant medication and had outpatient treatment,     Goals 1. Continue a healthy grieving process around the losses experienced.   Objective Identify and verbalize feelings associated with the loss. Target Date: 2025-02-16 Frequency: Daily  Progress: 75 Modality: individual  Related Interventions Assist the client  in identifying and expressing feelings connected with her loss.   2. Increase the use of self care, grounding and calming skills to assist coping w/ stressors and maintaining mood Objective Maintain positive self care, use of mindfulness and other grounding activities to maintain mood stability. Target Date: 2025-02-16 Frequency: Daily  Progress: 25 Modality: individual  Related Interventions Assist the client in establishing a routine pattern of daily activities such as sleeping, eating, solitary and social activities, and exercise; use of relaxation and grounding strategies routine and assist in managing stressors.    3. Recognize capacity for engaging/assisting in responsibilities, develop healthy boundaries for self and say no when doesn't have the capacity.     Objective Identify values, boundaries for work life balance and priorities responsibilities, set boundaries with self and others and say no to requests that compromise boundaries/capacity.   Target Date: 2025-02-16 Frequency: Daily  Progress: 0 Modality: individual  Related Interventions utilize CBT strategies to assist in challenging negative thought patterns and beliefs and ACT strategies to assist in actions, decisions and interactions consistent w/values.   This plan has been reviewed and created by the following participants:  This plan will be reviewed at least every 12 months. Date Behavioral Health Clinician Date Guardian/Patient   02/17/24 [facility] [name] [facility]       02/17/24 Verbal Consent Provided and electronic signature obtained.                   Julie Paolini, [facility]               [place], LCMHC

## 2024-05-02 ENCOUNTER — Encounter: Payer: Self-pay | Admitting: Gastroenterology

## 2024-05-02 ENCOUNTER — Ambulatory Visit: Payer: PRIVATE HEALTH INSURANCE | Admitting: Gastroenterology

## 2024-05-02 VITALS — BP 129/88 | HR 81 | Temp 98.2°F | Ht 65.0 in | Wt 179.2 lb

## 2024-05-02 DIAGNOSIS — K3184 Gastroparesis: Secondary | ICD-10-CM | POA: Diagnosis not present

## 2024-05-02 DIAGNOSIS — K219 Gastro-esophageal reflux disease without esophagitis: Secondary | ICD-10-CM

## 2024-05-02 NOTE — Progress Notes (Signed)
 GI Office Note    Referring Provider: Antonetta Rollene BRAVO, MD Primary Care Physician:  Antonetta Rollene BRAVO, MD  Primary Gastroenterologist: Carlin POUR. Cindie, DO   Chief Complaint   Chief Complaint  Patient presents with   Follow-up    Follow up after procedures    History of Present Illness   Christina [name] is a 48 y.o. female presenting today for follow-up.  Last seen in April 2025.  History of GERD, gastroparesis (abnormal GES in 2010), recurrent vomiting and diarrhea.  Small bowel biopsies previously negative for celiac disease.    Discussed the use of AI scribe software for clinical note transcription with the patient, who gave verbal consent to proceed.   She experiences frequent diarrhea, with approximately eight episodes occurring between mid-January and March or April. A colonoscopy was performed, and since then, she has not experienced significant diarrhea. Her bowel movements have become more regular, occurring every other day, which she considers normal. She experiences nausea after bowel movements, which she describes as a 'huge void' feeling. She is currently on omeprazole  for heartburn, which is effective unless she forgets to take it.  She has been on Mounjaro  since March 2024, after switching from Ozempic due to availability issues. She reports that Mounjaro  has been well-tolerated, with fewer side effects compared to Ozempic, which caused frequent diarrhea. She notes changes in her stool consistency since starting Mounjaro .  She developed a provoked DVT following an IV during recent colonoscopy, she was started on Eliquis . She is following with the DVT clinic.  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 She attributes this weight loss to a combination of hormonal changes and personal circumstances, including grief from family losses.    Wt Readings from Last 5 Encounters:   05/02/24 179 lb 3.2 oz (81.3 kg)  02/14/24 179 lb 8 oz (81.4 kg)  02/02/24 183 lb (83 kg)  01/31/24 170 lb (77.1 kg)  12/23/23 177 lb 3.2 oz (80.4 kg)    Prior Data   May 2025: Fecal calprotectin 47, CRP 2, white blood cell count 5.2, hemoglobin 10.9 low, hematocrit 36.8, MCV 87, platelets 397, creatinine 0.77, total bilirubin less than 0.2, alk phos 67, AST 12, ALT 10.  CT A/P with contrast 12/2023: unremarkable, surgically absent left adrenal gland   EGD June 2025: -Z-line regular, 40 cm from incisors - Gastritis status post biopsy, reactive gastropathy with minimal chronic gastritis.  Negative for H. pylori - Multiple gastric polyps - Normal duodenal bulb, first portion of duodenum the second portion of the duodenum  Colonoscopy June 2025: - Nonbleeding internal hemorrhoids - Examined portion of ileum normal - Redundant colon - Random colon biopsies, negative  Medications   Current Outpatient Medications  Medication Sig Dispense Refill   acetaminophen  (TYLENOL ) 500 MG tablet Take 1,000 mg every 8 (eight) hours as needed by mouth for mild pain.      apixaban  (ELIQUIS ) 5 MG TABS tablet Take 1 tablet (5 mg total) by mouth 2 (two) times daily. Start taking after completion of starter pack. 120 tablet 0   ARIPiprazole  (ABILIFY ) 5 MG tablet Take 1 tablet (5 mg total) by mouth every evening. 90 tablet 1   Azelastine  HCl 137 MCG/SPRAY SOLN Use 2 sprays in each nostril twice daily as directed 30 mL 11   cetirizine (ZYRTEC) 10 MG tablet Take 10 mg by mouth daily.  Empagliflozin-metFORMIN  HCl ER (SYNJARDY  XR) 12.12-998 MG TB24 Take two tablets by mouth once daily for diabetes 60 tablet 3   fluticasone  (FLONASE ) 50 MCG/ACT nasal spray Use 2 sprays in each nostril every day 48 g 4   losartan  (COZAAR ) 50 MG tablet Take 1 tablet (50 mg total) by mouth daily. 90 tablet 2   montelukast  (SINGULAIR ) 10 MG tablet Take 1 tablet (10 mg total) by mouth at bedtime. 90 tablet 2   MOUNJARO  7.5  MG/0.5ML Pen Inject 7.5 mg into the skin every Friday.     omeprazole  (PRILOSEC ) 40 MG capsule Take 1 capsule (40 mg total) by mouth daily. 30 capsule 0   ondansetron  (ZOFRAN ) 4 MG tablet Take 1 tablet (4 mg total) by mouth every 8 (eight) hours as needed for nausea or vomiting. 30 tablet 1   Probiotic Product (PROBIOTIC ADVANCED PO) Take 1 capsule by mouth 2 (two) times daily as needed (with antibiotic).     rosuvastatin  (CRESTOR ) 5 MG tablet Take one tablet by mouth two times weekly 32 tablet 3   No current facility-administered medications for this visit.    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 ENT: Negative for hoarseness, difficulty swallowing , nasal congestion. CV: Negative for chest pain, angina, palpitations, dyspnea on exertion, peripheral edema.  Respiratory: Negative for dyspnea at rest, dyspnea on exertion, cough, sputum, wheezing.  GI: See history of present illness. GU:  Negative for dysuria, hematuria, urinary incontinence, urinary frequency, nocturnal urination.  Endo: Negative for unusual weight change.     Physical Exam   BP 129/88   Pulse 81   Temp 98.2 F (36.8 C)   Ht 5' 5 (1.651 m)   Wt 179 lb 3.2 oz (81.3 kg)   BMI 29.82 kg/m    General: Well-nourished, well-developed in no acute distress.  Eyes: No icterus. Mouth: Oropharyngeal mucosa moist and pink   Abdomen: Bowel sounds are normal, nontender, nondistended, no hepatosplenomegaly or masses,  no abdominal bruits or hernia , no rebound or guarding.  Rectal: not performed Extremities: No lower extremity edema. No clubbing or deformities. Neuro:  Alert and oriented x 4   Skin: Warm and dry, no jaundice.   Psych: Alert and cooperative, normal mood and affect.  Labs   Lab Results  Component Value Date   NA 139 12/23/2023   CL 101 12/23/2023   K 4.4 12/23/2023   CO2 22 12/23/2023   BUN 9 12/23/2023   CREATININE 0.77 12/23/2023   EGFR 95 12/23/2023   CALCIUM  10.0 12/23/2023   ALBUMIN 4.5 12/23/2023   GLUCOSE 83 12/23/2023   Lab Results  Component Value Date   ALT 10 12/23/2023   AST 12 12/23/2023   ALKPHOS 67 12/23/2023   BILITOT <0.2 12/23/2023   Lab Results  Component Value Date   WBC 5.2 12/23/2023   HGB 10.9 (L) 12/23/2023   HCT 36.8 12/23/2023   MCV 87 12/23/2023   PLT 397 12/23/2023     Imaging Studies   No results found.  Assessment/Plan:    GERD/gastritis: Well controlled.  -continue omeprazole  40mg  daily before breakfast -limit NSAIDs -return ov in one year  Gastroparesis:  Relatively asymptomatic  Diarrhea: Resolved. Etiology not clear.  Negative fecal calprotectin, CRP, random colon biopsies.  History of provoked DVT (due to IV at time of endoscopy/colonoscopy) Seen at DVT clinic. Plans for 3 months of Eliquis  with follow up at DVT clinic prior to end of tx.  Sonny RAMAN. Ezzard, MHS, PA-C [facility]

## 2024-05-02 NOTE — Patient Instructions (Addendum)
 Continue omeprazole  40mg  daily before breakfast.   Your next colonoscopy will be in 01/2034 unless you develop new symptoms.   I looked back and your gastric (stomach) polyps were biopsied on a prior upper endoscopy and consistent with fundic gland polyps, completely benign!  Return to the office in one year or sooner if needed.

## 2024-05-15 ENCOUNTER — Ambulatory Visit (INDEPENDENT_AMBULATORY_CARE_PROVIDER_SITE_OTHER): Payer: PRIVATE HEALTH INSURANCE | Admitting: Psychology

## 2024-05-15 DIAGNOSIS — F319 Bipolar disorder, unspecified: Secondary | ICD-10-CM

## 2024-05-15 DIAGNOSIS — F4321 Adjustment disorder with depressed mood: Secondary | ICD-10-CM

## 2024-05-15 NOTE — Progress Notes (Signed)
 [facility] Counselor/Therapist Progress Note  Patient ID: Christina [name], MRN: [id number],    Date: 05/15/2024  Time Spent: 8:00am-8:44am  Treatment Type: Individual Therapy  Pt is seen for a virtual video visit via caregility.  Pt consents to telehealth session and is aware of limitations of telehealth visits. pt joins from her home, reporting privacy, and counselor from her home office.  Reported Symptoms:  Pt reports positive for day off.  Pt feels more manageable w/ commitments and decisions she is making about.   Mental Status Exam: Appearance:  Well Groomed     Behavior: Appropriate  Motor: Normal  Speech/Language:  Normal Rate  Affect: Appropriate  Mood: anxious- some worry  Thought process: normal  Thought content:   WNL  Sensory/Perceptual disturbances:   WNL  Orientation: oriented to person, place, time/date, and situation  Attention: Good  Concentration: Good  Memory: WNL  Fund of knowledge:  Good  Insight:   Good  Judgment:  Good  Impulse Control: Good   Risk Assessment: Danger to Self:  No Self-injurious Behavior: No Danger to Others: No Duty to Warn:no Physical Aggression / Violence:No   Subjective: Counselor assessed pt current functioning per pt report.  Processed w/pt positives and stressors.  Explored positives of time w/ friends over weekend and have a day off today w/ husband to move forward w/ tasks.  Discussed her decisions re: what to be involved w/ and how aligning w/ what she values.  Assisted pt w/ identifying time for self care.   pt affect wnl.  Pt reports she enjoyed her weekend w/ friends at high school reunion and ways to be intentional about getting together.  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  Pt reports stressors w/ worry for mom aging and how to support her.  Pt discussed decisions she made w/ being on boards and commitments to kids activities and how feels  that needs to stay connected with what values.  Pt discussed potential for walk group this week and commitment to her self care time.   Interventions: Cognitive Behavioral Therapy and boundaries and supportive  Diagnosis:Bipolar I disorder (HCC)  Grief  Plan: Pt to f/u w/ counseling in 2 weeks.   Pt to f/u w/ Dr. Arfeen as scheduled.     Treatment Plan 02/17/24   Client Abilities/Strengths  supports: family- partner and mom, friends  positives: self awareness, expressing feelings, advocating for self. Motivated, hardworking, intelligent, family centered   Client Treatment Preferences  Weekly to biweekly counseling and continue medication management Dr. Arfeen.    Client Statement of Needs  pt  Need to continue w/ my therapy and compliance w/ my medication management for mood stability.  to continue to support me- give my safe place continue talk about what I need. I want to recognize my capacity and boundaries.  I want to articulate saying no when needed.  More comforting activities in upcoming year for self.  .    Treatment Level  outpatient counseling    Symptoms  death of father 2021-04-13.  Husband dx w/ heart failure 11/2021. Grief and anxiety. History of at least one hypomanic, manic, or mixed mood episode.SABRA History of chronic or recurrent depression for which the client has taken antidepressant medication and had outpatient treatment,     Goals 1. Continue a healthy grieving process around the losses experienced.   Objective Identify and verbalize feelings associated with  the loss. Target Date: 2025-02-16 Frequency: Daily  Progress: 75 Modality: individual  Related Interventions Assist the client in identifying and expressing feelings connected with her loss.   2. Increase the use of self care, grounding and calming skills to assist coping w/ stressors and maintaining mood Objective Maintain positive self care, use of mindfulness and other grounding activities to maintain  mood stability. Target Date: 2025-02-16 Frequency: Daily  Progress: 25 Modality: individual  Related Interventions Assist the client in establishing a routine pattern of daily activities such as sleeping, eating, solitary and social activities, and exercise; use of relaxation and grounding strategies routine and assist in managing stressors.    3. Recognize capacity for engaging/assisting in responsibilities, develop healthy boundaries for self and say no when doesn't have the capacity.     Objective Identify values, boundaries for work life balance and priorities responsibilities, set boundaries with self and others and say no to requests that compromise boundaries/capacity.   Target Date: 2025-02-16 Frequency: Daily  Progress: 0 Modality: individual  Related Interventions utilize CBT strategies to assist in challenging negative thought patterns and beliefs and ACT strategies to assist in actions, decisions and interactions consistent w/values.   This plan has been reviewed and created by the following participants:  This plan will be reviewed at least every 12 months. Date Behavioral Health Clinician Date Guardian/Patient   02/17/24 [facility] [name] [facility]       02/17/24 Verbal Consent Provided and electronic signature obtained.                  [name] APPL, LCMHC

## 2024-05-29 ENCOUNTER — Ambulatory Visit: Payer: PRIVATE HEALTH INSURANCE | Admitting: Psychology

## 2024-06-05 ENCOUNTER — Ambulatory Visit: Payer: PRIVATE HEALTH INSURANCE | Admitting: Psychology

## 2024-06-19 ENCOUNTER — Ambulatory Visit
Admission: RE | Admit: 2024-06-19 | Discharge: 2024-06-19 | Disposition: A | Discharge status: Home / Self Care | Payer: PRIVATE HEALTH INSURANCE | Source: Ambulatory Visit | Attending: Nurse Practitioner | Admitting: Nurse Practitioner

## 2024-06-19 ENCOUNTER — Ambulatory Visit (INDEPENDENT_AMBULATORY_CARE_PROVIDER_SITE_OTHER): Payer: PRIVATE HEALTH INSURANCE | Admitting: Psychology

## 2024-06-19 VITALS — BP 118/87 | HR 97 | Temp 98.3°F | Resp 17 | Ht 65.0 in | Wt 167.0 lb

## 2024-06-19 DIAGNOSIS — R112 Nausea with vomiting, unspecified: Secondary | ICD-10-CM | POA: Diagnosis not present

## 2024-06-19 DIAGNOSIS — F4321 Adjustment disorder with depressed mood: Secondary | ICD-10-CM

## 2024-06-19 DIAGNOSIS — R109 Unspecified abdominal pain: Secondary | ICD-10-CM

## 2024-06-19 DIAGNOSIS — F319 Bipolar disorder, unspecified: Secondary | ICD-10-CM | POA: Diagnosis not present

## 2024-06-19 DIAGNOSIS — R509 Fever, unspecified: Secondary | ICD-10-CM | POA: Diagnosis not present

## 2024-06-19 LAB — POC COVID19/FLU A&B COMBO
Covid Antigen, POC: NEGATIVE
Influenza A Antigen, POC: NEGATIVE
Influenza B Antigen, POC: NEGATIVE

## 2024-06-19 NOTE — ED Triage Notes (Signed)
 Pt  states that she has vomiting, diarrhea, and fever. X2 days Pt states that she has taken Tylenol  and Zofran .   Pt states that she last had Tylenol  at 9:00 am today.  Pt states that her fever was 102.

## 2024-06-19 NOTE — Discharge Instructions (Addendum)
 As we discussed, your symptoms are consistent with a stomach bug.  Continue zofran  every 8 hours as needed and push hydration with plenty of fluids.  Symptoms should improve over the next couple days.  If you develop severe pain or nausea/vomiting and are unable to keep fluids down, please seek care in the ER.

## 2024-06-19 NOTE — Progress Notes (Signed)
 [facility] Counselor/Therapist Progress Note  Patient ID: Christina [name], MRN: [id number],    Date: 06/19/2024  Time Spent: 4:30pm-5:16pm  Treatment Type: Individual Therapy  Pt is seen for a virtual video visit via caregility.  Pt consents to telehealth session and is aware of limitations of telehealth visits. pt joins from her home, reporting privacy, and counselor from her home office.  Reported Symptoms:  Pt reports recognizing stressors w/tending to family and need for self care.  Mental Status Exam: Appearance:  Well Groomed     Behavior: Appropriate  Motor: Normal  Speech/Language:  Normal Rate  Affect: Appropriate  Mood: anxious- overwhelm  Thought process: normal  Thought content:   WNL  Sensory/Perceptual disturbances:   WNL  Orientation: oriented to person, place, time/date, and situation  Attention: Good  Concentration: Good  Memory: WNL  Fund of knowledge:  Good  Insight:   Good  Judgment:  Good  Impulse Control: Good   Risk Assessment: Danger to Self:  No Self-injurious Behavior: No Danger to Others: No Duty to Warn:no Physical Aggression / Violence:No   Subjective: Counselor assessed pt current functioning per pt report.  Processed w/pt positives, stressors and emotions. Explored pressures w/ tending to family-kids and mom.  Reflected positive steps taken and given self permission to seek help.   Discussed her self care and recognizing need for continued intentions for time towards.   pt affect wnl.  Pt reports she is home sick today.  Pt reports that she had taken step for intermittent leave for her special needs son and acknowledging that ok to seek that help/resource.  Pt reports that also took step for daughter for counseling.  Pt reports positives of these steps and also feeling overwhelmed w/ squeeze between needing everyone's needs and awareness of need for her own self care.  Pt discussed movement is her self care and setting time and  intention for each week.     Interventions: Cognitive Behavioral Therapy and boundaries and supportive  Diagnosis:Bipolar I disorder (HCC)  Grief  Plan: Pt to f/u w/ counseling in 2 weeks.   Pt to f/u w/ Dr. Arfeen as scheduled.     Treatment Plan 02/17/24   Client Abilities/Strengths  supports: family- partner and mom, friends  positives: self awareness, expressing feelings, advocating for self. Motivated, hardworking, intelligent, family centered   Client Treatment Preferences  Weekly to biweekly counseling and continue medication management Dr. Arfeen.    Client Statement of Needs  pt  Need to continue w/ my therapy and compliance w/ my medication management for mood stability.  to continue to support me- give my safe place continue talk about what I need. I want to recognize my capacity and boundaries.  I want to articulate saying no when needed.  More comforting activities in upcoming year for self.  .    Treatment Level  outpatient counseling    Symptoms  death of father 2021/04/13.  Husband dx w/ heart failure 11/2021. Grief and anxiety. History of at least one hypomanic, manic, or mixed mood episode.SABRA History of chronic or recurrent depression for which the client has taken antidepressant medication and had outpatient treatment,     Goals 1. Continue a healthy grieving process around the losses experienced.   Objective Identify and verbalize feelings associated with the loss. Target Date: 2025-02-16 Frequency: Daily  Progress: 75 Modality: individual  Related Interventions Assist the client in identifying and expressing feelings connected with her loss.   2. Increase  the use of self care, grounding and calming skills to assist coping w/ stressors and maintaining mood Objective Maintain positive self care, use of mindfulness and other grounding activities to maintain mood stability. Target Date: 2025-02-16 Frequency: Daily  Progress: 25 Modality: individual  Related  Interventions Assist the client in establishing a routine pattern of daily activities such as sleeping, eating, solitary and social activities, and exercise; use of relaxation and grounding strategies routine and assist in managing stressors.    3. Recognize capacity for engaging/assisting in responsibilities, develop healthy boundaries for self and say no when doesn't have the capacity.     Objective Identify values, boundaries for work life balance and priorities responsibilities, set boundaries with self and others and say no to requests that compromise boundaries/capacity.   Target Date: 2025-02-16 Frequency: Daily  Progress: 0 Modality: individual  Related Interventions utilize CBT strategies to assist in challenging negative thought patterns and beliefs and ACT strategies to assist in actions, decisions and interactions consistent w/values.   This plan has been reviewed and created by the following participants:  This plan will be reviewed at least every 12 months. Date Behavioral Health Clinician Date Guardian/Patient   02/17/24 [name] [facility]       02/17/24 Verbal Consent Provided and electronic signature obtained.                [name] APPL, LCMHC

## 2024-06-19 NOTE — ED Provider Notes (Signed)
 [facility]    CSN: [id number] Arrival date & time: 06/19/24  1036      History   Chief Complaint Chief Complaint  Patient presents with   Fever    101.1 (pm 11/16) took 2 Tylenolvomiting (3x)diarrhea (multiple episodes)nausea - Entered by patient    HPI Christina [name] is a 48 y.o. female.   Patient presents today with 1 day history of generalized abdominal pain, vomiting, diarrhea, and fever.  Tmax 101.2 F.  She endorses about 5 episodes of vomiting and diarrhea today, no blood in the vomit or stool.  She reports she is vomiting bile and the diarrhea is pure water at this point.  No known sick contacts.  Has taken Zofran  which helped with the nausea yesterday as well as Tylenol  which helped with the fever.  She was able to eat some applesauce and keep it down today but has not tried to drink much fluids.    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   GERD (gastroesophageal reflux disease)    H/O acute sinusitis 10/2016   Hypertension    Purulent discharge from nipple 01/06/2022   Thyroid  enlargement    Wears glasses     Patient Active Problem List   Diagnosis Date Noted   Pain and swelling of upper extremity, left 02/14/2024   Encounter for immunization 02/14/2024   Obesity (BMI 30.0-34.9) 02/14/2024   Vitamin D  deficiency 07/09/2023   Lipid screening 07/09/2023   Irregular menses 02/23/2023   Urinary incontinence 11/15/2022   Bloody discharge from left nipple 01/06/2022   Stress due to illness of family member 12/29/2021   Grief 03/27/2021   Abnormal EKG 08/28/2019   Diastasis recti 09/21/2017   Bipolar 1 disorder, mixed, moderate (HCC) 06/17/2016   Type 2 diabetes mellitus with  other specified complication (HCC) 03/16/2016   Anemia of pregnancy 01/02/2016   Bipolar 1 disorder (HCC) 11/19/2014   Fatty liver 10/21/2013   Essential hypertension, benign 08/09/2013   Iron deficiency anemia 05/15/2013   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   Family history of blood disorder 02/28/2013   Reaction to tuberculin skin test without active tuberculosis 02/28/2013   Seasonal allergies 02/28/2013   History of positive PPD 02/28/2013   GASTROPARESIS 09/02/2009   GERD 01/27/2009    Past Surgical History:  Procedure Laterality Date   BREAST BIOPSY Left 02/09/2022   BREAST BIOPSY Left 02/13/2022   BREAST EXCISIONAL BIOPSY Left 03/18/2022   BREAST REDUCTION SURGERY  1994   BREAST SURGERY N/A    Phreesia 08/26/2020   CESAREAN SECTION N/A 03/06/2016   Procedure: CESAREAN SECTION;  Surgeon: Rome Rigg, MD;  Location: [facility];  Service: Obstetrics;  Laterality: N/A;   COLONOSCOPY N/A 01/31/2024   Procedure: COLONOSCOPY;  Surgeon: Cindie Carlin POUR, DO;  Location: [facility];  Service: Endoscopy;  Laterality: N/A;  1230pm, asa 2   COLONOSCOPY WITH PROPOFOL  N/A 10/26/2017   Dr. harvey: Torturous transverse and sigmoid colon, internal hemorrhoids.  Next colonoscopy in 10 years with MAC and color wrap   dermoid tumor  2000   DILITATION & CURRETTAGE/HYSTROSCOPY  WITH NOVASURE ABLATION N/A 12/10/2016   Procedure: DILATATION & CURETTAGE/HYSTEROSCOPY WITH NOVASURE ABLATION;  Surgeon: Manda Fess, MD;  Location: [facility];  Service: Gynecology;  Laterality: N/A;   ESOPHAGOGASTRODUODENOSCOPY  07/01/2009   DOQ:wnmfjo esphagus without barrett's/dilation with 16 mm/mild erthyema in the antrum. mild chronic gastritis on path.    ESOPHAGOGASTRODUODENOSCOPY N/A 01/31/2024   Procedure: EGD (ESOPHAGOGASTRODUODENOSCOPY);  Surgeon: Cindie Carlin POUR, DO;  Location: [facility];  Service: Endoscopy;   Laterality: N/A;   ESOPHAGOGASTRODUODENOSCOPY (EGD) WITH PROPOFOL  N/A 10/26/2017   Dr. harvey: Gastritis, gastric polyps.  Biopsies benign.  Small bowel biopsies negative for celiac.  No H. pylori.   HERNIA REPAIR  12/2018   RADIOACTIVE SEED GUIDED EXCISIONAL BREAST BIOPSY Left 03/18/2022   Procedure: LEFT BREAST RADIOACTIVE SEED LOCALIZED EXCISIONAL BIOPSY;  Surgeon: Belinda Cough, MD;  Location: [facility];  Service: General;  Laterality: Left;   REDUCTION MAMMAPLASTY     1994   TRIGGER FINGER RELEASE Bilateral 06/2015   TUBAL LIGATION Bilateral 03/06/2016   Procedure: BILATERAL TUBAL LIGATION;  Surgeon: Fess Manda, MD;  Location: [facility];  Service: Obstetrics;  Laterality: Bilateral;    OB History     Gravida  4   Para  3   Term  2   Preterm  1   AB      Living  3      SAB      IAB      Ectopic      Multiple  0   Live Births  3            Home Medications    Prior to Admission medications   Medication Sig Start Date End Date Taking? Authorizing Provider  acetaminophen  (TYLENOL ) 500 MG tablet Take 1,000 mg every 8 (eight) hours as needed by mouth for mild pain.    Yes [provider]  apixaban  (ELIQUIS ) 5 MG TABS tablet Take 1 tablet (5 mg total) by mouth 2 (two) times daily. Start taking after completion of starter pack. 02/14/24  Yes Yates, Madison B, RPH-CPP  ARIPiprazole  (ABILIFY ) 5 MG tablet Take 1 tablet (5 mg total) by mouth every evening. 03/01/24  Yes Arfeen, Leni DASEN, MD  Azelastine  HCl 137 MCG/SPRAY SOLN Use 2 sprays in each nostril twice daily as directed 04/05/24  Yes Antonetta Rollene BRAVO, MD  cetirizine (ZYRTEC) 10 MG tablet Take 10 mg by mouth daily.   Yes [provider]  Empagliflozin-metFORMIN  HCl ER (SYNJARDY  XR) 12.12-998 MG TB24 Take two tablets by mouth once daily for diabetes 12/16/22  Yes Antonetta Rollene BRAVO, MD  fluticasone  (FLONASE ) 50 MCG/ACT nasal spray Use 2 sprays in each nostril every day  04/05/24  Yes Antonetta Rollene BRAVO, MD  losartan  (COZAAR ) 50 MG tablet Take 1 tablet (50 mg total) by mouth daily. 02/02/24  Yes Antonetta Rollene BRAVO, MD  montelukast  (SINGULAIR ) 10 MG tablet Take 1 tablet (10 mg total) by mouth at bedtime. 12/16/22  Yes Antonetta Rollene BRAVO, MD  MOUNJARO  7.5 MG/0.5ML Pen Inject 7.5 mg into the skin every Friday.   Yes [provider]  omeprazole  (PRILOSEC ) 40 MG capsule Take 1 capsule (40 mg total) by mouth daily. 10/07/23  Yes Rudy Josette RAMAN, PA-C  ondansetron  (ZOFRAN ) 4 MG tablet Take 1 tablet (4 mg total) by mouth every 8 (eight) hours as needed for nausea or vomiting. 11/02/22  Yes Rudy Josette RAMAN, PA-C  Probiotic Product (PROBIOTIC ADVANCED PO) Take 1 capsule by mouth  2 (two) times daily as needed (with antibiotic).   Yes [provider]  rosuvastatin  (CRESTOR ) 5 MG tablet Take one tablet by mouth two times weekly 02/02/24  Yes Antonetta Rollene BRAVO, MD    Family History Family History  Problem Relation Age of Onset   Diabetes Mother    Hypertension Mother    Fibromyalgia Mother    Stroke Mother    Diabetes Father    Hypertension Father    Asthma Father    Heart disease Father    Depression Father    Colon cancer Neg Hx    Breast cancer Neg Hx    Inflammatory bowel disease Neg Hx     Social History Social History   Tobacco Use   Smoking status: Never    Passive exposure: Never   Smokeless tobacco: Never   Tobacco comments:    not everyday  Vaping Use   Vaping status: Never Used  Substance Use Topics   Alcohol use: Not Currently    Comment: occ   Drug use: No     Allergies   Other, Peanut oil, Peanut-containing drug products, and Augmentin [amoxicillin-pot clavulanate]   Review of Systems Review of Systems Per HPI  Physical Exam Triage Vital Signs ED Triage Vitals  Encounter Vitals Group     BP 06/19/24 1123 118/87     Girls Systolic BP Percentile --      Girls Diastolic BP Percentile --      Boys Systolic BP  Percentile --      Boys Diastolic BP Percentile --      Pulse Rate 06/19/24 1123 97     Resp 06/19/24 1123 17     Temp 06/19/24 1123 98.3 F (36.8 C)     Temp Source 06/19/24 1123 Oral     SpO2 06/19/24 1123 98 %     Weight 06/19/24 1122 167 lb (75.8 kg)     Height 06/19/24 1122 5' 5 (1.651 m)     Head Circumference --      Peak Flow --      Pain Score 06/19/24 1121 7     Pain Loc --      Pain Education --      Exclude from Growth Chart --    No data found.  Updated Vital Signs BP 118/87 (BP Location: Right Arm)   Pulse 97   Temp 98.3 F (36.8 C) (Oral)   Resp 17   Ht 5' 5 (1.651 m)   Wt 167 lb (75.8 kg)   SpO2 98%   BMI 27.79 kg/m   Visual Acuity Right Eye Distance:   Left Eye Distance:   Bilateral Distance:    Right Eye Near:   Left Eye Near:    Bilateral Near:     Physical Exam Vitals and nursing note reviewed.  Constitutional:      General: She is not in acute distress.    Appearance: Normal appearance. She is ill-appearing. She is not toxic-appearing.  HENT:     Head: Normocephalic and atraumatic.     Mouth/Throat:     Mouth: Mucous membranes are moist.     Pharynx: Oropharynx is clear.  Eyes:     General: No scleral icterus.    Extraocular Movements: Extraocular movements intact.  Cardiovascular:     Rate and Rhythm: Normal rate and regular rhythm.  Pulmonary:     Effort: Pulmonary effort is normal. No respiratory distress.     Breath sounds: Normal breath sounds.  No wheezing, rhonchi or rales.  Abdominal:     General: Abdomen is flat. Bowel sounds are decreased. There is no distension.     Palpations: Abdomen is soft.     Tenderness: There is abdominal tenderness in the left upper quadrant and left lower quadrant. There is no right CVA tenderness, left CVA tenderness, guarding or rebound.  Musculoskeletal:     Cervical back: Normal range of motion.  Lymphadenopathy:     Cervical: No cervical adenopathy.  Skin:    General: Skin is warm and  dry.     Capillary Refill: Capillary refill takes less than 2 seconds.     Coloration: Skin is not jaundiced or pale.     Findings: No erythema.  Neurological:     Mental Status: She is alert and oriented to person, place, and time.  Psychiatric:        Behavior: Behavior is cooperative.      UC Treatments / Results  Labs (all labs ordered are listed, but only abnormal results are displayed) Labs Reviewed  POC COVID19/FLU A&B COMBO    EKG   Radiology No results found.  Procedures Procedures (including critical care time)  Medications Ordered in UC Medications - No data to display  Initial Impression / Assessment and Plan / UC Course  I have reviewed the triage vital signs and the nursing notes.  Pertinent labs & imaging results that were available during my care of the patient were reviewed by me and considered in my medical decision making (see chart for details).   In triage, vital signs are stable.  Patient appears ill but is nontoxic.  On exam, she has generalized tenderness of the left lower and left upper quadrants of the abdomen, however there is no guarding or rebound tenderness.  I am highly suspicious for viral gastroenteritis.  Treat with continued Zofran  which she has plenty of at home every 8 hours and push hydration.  Strict ER precautions were discussed with the patient if symptoms worsen or if she is unable to tolerate fluids at home with the Zofran .  Work excuse was provided.   The patient was given the opportunity to ask questions.  All questions answered to their satisfaction.  The patient is in agreement to this plan.   Final Clinical Impressions(s) / UC Diagnoses   Final diagnoses:  Fever, unspecified  Abdominal pain, unspecified abdominal location  Nausea and vomiting, unspecified vomiting type     Discharge Instructions      As we discussed, your symptoms are consistent with a stomach bug.  Continue zofran  every 8 hours as needed and push  hydration with plenty of fluids.  Symptoms should improve over the next couple days.  If you develop severe pain or nausea/vomiting and are unable to keep fluids down, please seek care in the ER.    ED Prescriptions   None    PDMP not reviewed this encounter.   Chandra Harlene LABOR, NP 06/19/24 1246

## 2024-06-20 ENCOUNTER — Ambulatory Visit: Payer: PRIVATE HEALTH INSURANCE | Admitting: Family Medicine

## 2024-06-20 ENCOUNTER — Ambulatory Visit: Payer: PRIVATE HEALTH INSURANCE | Admitting: Psychology

## 2024-06-20 ENCOUNTER — Other Ambulatory Visit: Payer: Self-pay | Admitting: Family Medicine

## 2024-07-12 ENCOUNTER — Ambulatory Visit: Payer: PRIVATE HEALTH INSURANCE | Admitting: Psychology

## 2024-07-12 DIAGNOSIS — F319 Bipolar disorder, unspecified: Secondary | ICD-10-CM

## 2024-07-12 NOTE — Progress Notes (Signed)
  Behavioral Health Counselor/Therapist Progress Note  Patient ID: Christina [name], MRN: [id number],    Date: 07/12/2024  Time Spent: 4:42pm-5:19pm  Treatment Type: Individual Therapy  Pt is seen for a virtual video visit via caregility.  Pt consents to telehealth session and is aware of limitations of telehealth visits. pt joins from her parked care in [place], reporting privacy, and counselor from her home office.  Reported Symptoms:  Pt reports feeling overwhelmed, working on setting boundaries and saying no.    Mental Status Exam: Appearance:  Well Groomed     Behavior: Appropriate  Motor: Normal  Speech/Language:  Normal Rate  Affect: Appropriate  Mood: overwhelmed  Thought process: normal  Thought content:   WNL  Sensory/Perceptual disturbances:   WNL  Orientation: oriented to person, place, time/date, and situation  Attention: Good  Concentration: Good  Memory: WNL  Fund of knowledge:  Good  Insight:   Good  Judgment:  Good  Impulse Control: Good   Risk Assessment: Danger to Self:  No Self-injurious Behavior: No Danger to Others: No Duty to Warn:no Physical Aggression / Violence:No   Subjective: Counselor assessed pt current functioning per pt report.  Processed w/pt positives and stressors. Explored areas setting boundaries and not taking on more.  Discussed feeling overwhelmed and how to shift for self care.   pt affect wnl.  Pt reports she has a lot going on recent for self, work, family.  Pt reports she is busy and has been setting boundaries and working on say no to others.  Pt reports awareness that not doings self care routines.  Pt reports finding spending more and needing to reevaluate for self.  Pt reports they are going on family trip to [facility] at end of month.    Interventions: Cognitive Behavioral Therapy and boundaries and supportive  Diagnosis:Bipolar I disorder (HCC)  Plan: Pt to f/u w/ counseling in 1 week.   Pt to f/u w/ Dr. Arfeen as scheduled.      Treatment Plan 02/17/24   Client Abilities/Strengths  supports: family- partner and mom, friends  positives: self awareness, expressing feelings, advocating for self. Motivated, hardworking, intelligent, family centered   Client Treatment Preferences  Weekly to biweekly counseling and continue medication management Dr. Arfeen.    Client Statement of Needs  pt  Need to continue w/ my therapy and compliance w/ my medication management for mood stability.  to continue to support me- give my safe place continue talk about what I need. I want to recognize my capacity and boundaries.  I want to articulate saying no when needed.  More comforting activities in upcoming year for self.  .    Treatment Level  outpatient counseling    Symptoms  death of father 04-01-2021.  Husband dx w/ heart failure 11/2021. Grief and anxiety. History of at least one hypomanic, manic, or mixed mood episode.SABRA History of chronic or recurrent depression for which the client has taken antidepressant medication and had outpatient treatment,     Goals 1. Continue a healthy grieving process around the losses experienced.   Objective Identify and verbalize feelings associated with the loss. Target Date: 2025-02-16 Frequency: Daily  Progress: 75 Modality: individual  Related Interventions Assist the client in identifying and expressing feelings connected with her loss.   2. Increase the use of self care, grounding and calming skills to assist coping w/ stressors and maintaining mood Objective Maintain positive self care, use of mindfulness and other grounding activities to maintain mood  stability. Target Date: 2025-02-16 Frequency: Daily  Progress: 25 Modality: individual  Related Interventions Assist the client in establishing a routine pattern of daily activities such as sleeping, eating, solitary and social activities, and exercise; use of relaxation and grounding strategies routine and assist in managing  stressors.    3. Recognize capacity for engaging/assisting in responsibilities, develop healthy boundaries for self and say no when doesn't have the capacity.     Objective Identify values, boundaries for work life balance and priorities responsibilities, set boundaries with self and others and say no to requests that compromise boundaries/capacity.   Target Date: 2025-02-16 Frequency: Daily  Progress: 0 Modality: individual  Related Interventions utilize CBT strategies to assist in challenging negative thought patterns and beliefs and ACT strategies to assist in actions, decisions and interactions consistent w/values.   This plan has been reviewed and created by the following participants:  This plan will be reviewed at least every 12 months. Date Behavioral Health Clinician Date Guardian/Patient   02/17/24 [name]       02/17/24 Verbal Consent Provided and electronic signature obtained.                 [name] APPL, LCMHC

## 2024-07-20 ENCOUNTER — Ambulatory Visit: Payer: PRIVATE HEALTH INSURANCE | Admitting: Psychology

## 2024-07-20 DIAGNOSIS — F4321 Adjustment disorder with depressed mood: Secondary | ICD-10-CM

## 2024-07-20 DIAGNOSIS — F319 Bipolar disorder, unspecified: Secondary | ICD-10-CM | POA: Diagnosis not present

## 2024-07-20 NOTE — Progress Notes (Signed)
 [facility] Counselor/Therapist Progress Note  Patient ID: Christina [name], MRN: [id number],    Date: 07/20/2024  Time Spent: 8:01am-8:49am  Treatment Type: Individual Therapy  Pt is seen for a virtual video visit via caregility.  Pt consents to telehealth session and is aware of limitations of telehealth visits. pt joins from her home, reporting privacy, and counselor from her office.  Reported Symptoms:  Pt reports grief at holidays and transitions and recognized impact mood more down.  Mental Status Exam: Appearance:  Well Groomed     Behavior: Appropriate  Motor: Normal  Speech/Language:  Normal Rate  Affect: Appropriate  Mood: sad  Thought process: normal  Thought content:   WNL  Sensory/Perceptual disturbances:   WNL  Orientation: oriented to person, place, time/date, and situation  Attention: Good  Concentration: Good  Memory: WNL  Fund of knowledge:  Good  Insight:   Good  Judgment:  Good  Impulse Control: Good   Risk Assessment: Danger to Self:  No Self-injurious Behavior: No Danger to Others: No Duty to Warn:no Physical Aggression / Violence:No   Subjective: Counselor assessed pt current functioning per pt report.  Processed w/pt positives,stressors and emotions. Explored grief and how impacting w/ transitions of son college process and at holidays. Discussed ways connected and honoring.  Reflected positive of saying no to not take on more and evaluating what involved in.  pt affect wnl.  Pt reports she has recognized being more down over past couple of weeks.  Pt reflects on awareness that as going into holidays but also transitions of son applying to college that missing her father.  Pt discussed his guidance in her life on and her son's.  Pt discussed ways of feeling connected.  Pt reports she has been able to look at not taking on more and where can step away to give self and family more time.  Pt did decline taking on new project.  Pt discussed  being intentional about this and time for stepping away at lunch for self care/boundaries.   Interventions: Cognitive Behavioral Therapy and boundaries and supportive  Diagnosis:Bipolar I disorder (HCC)  Grief  Plan: Pt to f/u w/ counseling in 1 week.   Pt to f/u w/ Dr. Arfeen as scheduled.     Treatment Plan 02/17/24   Client Abilities/Strengths  supports: family- partner and mom, friends  positives: self awareness, expressing feelings, advocating for self. Motivated, hardworking, intelligent, family centered   Client Treatment Preferences  Weekly to biweekly counseling and continue medication management Dr. Arfeen.    Client Statement of Needs  pt  Need to continue w/ my therapy and compliance w/ my medication management for mood stability.  to continue to support me- give my safe place continue talk about what I need. I want to recognize my capacity and boundaries.  I want to articulate saying no when needed.  More comforting activities in upcoming year for self.  .    Treatment Level  outpatient counseling    Symptoms  death of father April 05, 2021.  Husband dx w/ heart failure 11/2021. Grief and anxiety. History of at least one hypomanic, manic, or mixed mood episode.SABRA History of chronic or recurrent depression for which the client has taken antidepressant medication and had outpatient treatment,     Goals 1. Continue a healthy grieving process around the losses experienced.   Objective Identify and verbalize feelings associated with the loss. Target Date: 2025-02-16 Frequency: Daily  Progress: 75 Modality: individual  Related Interventions  Assist the client in identifying and expressing feelings connected with her loss.   2. Increase the use of self care, grounding and calming skills to assist coping w/ stressors and maintaining mood Objective Maintain positive self care, use of mindfulness and other grounding activities to maintain mood stability. Target Date: 2025-02-16  Frequency: Daily  Progress: 25 Modality: individual  Related Interventions Assist the client in establishing a routine pattern of daily activities such as sleeping, eating, solitary and social activities, and exercise; use of relaxation and grounding strategies routine and assist in managing stressors.    3. Recognize capacity for engaging/assisting in responsibilities, develop healthy boundaries for self and say no when doesn't have the capacity.     Objective Identify values, boundaries for work life balance and priorities responsibilities, set boundaries with self and others and say no to requests that compromise boundaries/capacity.   Target Date: 2025-02-16 Frequency: Daily  Progress: 0 Modality: individual  Related Interventions utilize CBT strategies to assist in challenging negative thought patterns and beliefs and ACT strategies to assist in actions, decisions and interactions consistent w/values.   This plan has been reviewed and created by the following participants:  This plan will be reviewed at least every 12 months. Date Behavioral Health Clinician Date Guardian/Patient   02/17/24 [name]       02/17/24 Verbal Consent Provided and electronic signature obtained.                  [name] APPL, LCMHC

## 2024-08-15 ENCOUNTER — Ambulatory Visit: Payer: PRIVATE HEALTH INSURANCE | Admitting: Psychology

## 2024-08-22 ENCOUNTER — Ambulatory Visit: Payer: PRIVATE HEALTH INSURANCE | Admitting: Psychology

## 2024-08-30 ENCOUNTER — Encounter (HOSPITAL_COMMUNITY): Payer: Self-pay | Admitting: Psychiatry

## 2024-08-30 ENCOUNTER — Telehealth (HOSPITAL_COMMUNITY): Payer: PRIVATE HEALTH INSURANCE | Admitting: Psychiatry

## 2024-08-30 ENCOUNTER — Ambulatory Visit: Payer: PRIVATE HEALTH INSURANCE | Admitting: Psychology

## 2024-08-30 VITALS — Wt 169.0 lb

## 2024-08-30 DIAGNOSIS — F3162 Bipolar disorder, current episode mixed, moderate: Secondary | ICD-10-CM | POA: Diagnosis not present

## 2024-08-30 DIAGNOSIS — F419 Anxiety disorder, unspecified: Secondary | ICD-10-CM | POA: Diagnosis not present

## 2024-08-30 MED ORDER — ARIPIPRAZOLE 5 MG PO TABS: 5.0000 mg | ORAL_TABLET | Freq: Every evening | ORAL | 1 refills | Status: AC

## 2024-08-30 NOTE — Progress Notes (Signed)
 " [facility] MD Virtual Progress Note   Patient Location: Work Provider Location: Home Office  I connect with patient by video and verified that I am speaking with correct person by using two identifiers. I discussed the limitations of evaluation and management by telemedicine and the availability of in person appointments. I also discussed with the patient that there may be a patient responsible charge related to this service. The patient expressed understanding and agreed to proceed.  Christina [name] [id number] 49 y.o.  08/30/2024 8:40 AM  History of Present Illness:  Patient is evaluated by video session.  She reported things are going well.  Her 49 year old is during college visit.  So far tour [employer], [place] and [address].  Patient is pleased that her husband finally approved his disability.  She reported job is challenging but so far manageable.  Patient works for city.  She denies any irritability, anger, mania or any psychosis.  She reported 5 mg Abilify  keeping her mood stable and she has no recent manic-like episode.  She sleeps good.  She is on Mounjaro  and lost another 10 pounds since the last visit.  Patient denies any panic attack.  She has appointment coming up to see Dr. Antonetta to get blood work.  She denies any active or passive suicidal thoughts.  Patient lives with her husband, 96 year old, 90 year old and 20-year-old who had autism.  Patient does not want to change the medication since Abilify  keeping her mood stable.  Past Psychiatric History: H/O depression, anxiety, mania and psychosis.  Admitted in 1997 at [facility] due to suicidal gestures.  Seen in this office since 2006. Tried Prozac and Wellbutrin .     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    apixaban  (ELIQUIS ) 5 MG TABS tablet Take 1 tablet (5 mg total) by mouth 2 (two) times daily. Start taking after completion of starter pack.   ARIPiprazole  (ABILIFY ) 5 MG tablet Take 1 tablet (5 mg total) by mouth every evening.   Azelastine  HCl 137 MCG/SPRAY SOLN Use 2 sprays in each nostril twice daily as directed   cetirizine (ZYRTEC) 10 MG tablet Take 10 mg by mouth daily.   Empagliflozin-metFORMIN  HCl ER (SYNJARDY  XR) 12.12-998 MG TB24 Take two tablets by mouth once daily for diabetes   fluticasone  (FLONASE ) 50 MCG/ACT nasal spray Use 2 sprays in each nostril every day   losartan  (COZAAR ) 50 MG tablet TAKE ONE TABLET BY MOUTH ONCE DAILY   montelukast  (SINGULAIR ) 10 MG tablet Take 1 tablet (10 mg total) by mouth at bedtime.   MOUNJARO  7.5 MG/0.5ML Pen Inject 7.5 mg into the skin every Friday.   omeprazole  (PRILOSEC ) 40 MG capsule Take 1 capsule (40 mg total) by mouth daily.   ondansetron  (ZOFRAN ) 4 MG  tablet Take 1 tablet (4 mg total) by mouth every 8 (eight) hours as needed for nausea or vomiting.   Probiotic Product (PROBIOTIC ADVANCED PO) Take 1 capsule by mouth 2 (two) times daily as needed (with antibiotic).   rosuvastatin  (CRESTOR ) 5 MG tablet Take one tablet by mouth two times weekly   No facility-administered encounter medications on file as of 08/30/2024.    Recent Results (from the past 2160 hours)  POC Covid19/Flu A&B Antigen     Status: None   Collection Time: 06/19/24 11:48 AM  Result Value Ref Range   Influenza A Antigen, POC Negative Negative    Influenza B Antigen, POC Negative Negative   Covid Antigen, POC Negative Negative     Psychiatric Specialty Exam: Physical Exam  Review of Systems  Weight 169 lb (76.7 kg).There is no height or weight on file to calculate BMI.  General Appearance: Casual  Eye Contact:  Good  Speech:  Normal Rate  Volume:  Normal  Mood:  Pleasant  Affect:  Congruent  Thought Process:  Goal Directed  Orientation:  Full (Time, Place, and Person)  Thought Content:  WDL  Suicidal Thoughts:  No  Homicidal Thoughts:  No  Memory:  Immediate;   Good Recent;   Good Remote;   Good  Judgement:  Good  Insight:  Present  Psychomotor Activity:  Normal  Concentration:  Concentration: Good and Attention Span: Good  Recall:  Good  Fund of Knowledge:  Good  Language:  Good  Akathisia:  No  Handed:  Right  AIMS (if indicated):     Assets:  Communication Skills Desire for Improvement Housing Resilience Social Support Talents/Skills Transportation  ADL's:  Intact  Cognition:  WNL  Sleep:  ok       02/02/2024    1:01 PM 06/30/2023    1:38 PM 02/17/2023   10:50 AM 11/11/2022   11:37 AM 05/05/2022    2:31 PM  Depression screen PHQ 2/9  Decreased Interest 0 0 0 0 0  Down, Depressed, Hopeless 0 0 0 0 0  PHQ - 2 Score 0 0 0 0 0    Assessment/Plan: Bipolar 1 disorder, mixed, moderate (HCC) - Plan: ARIPiprazole  (ABILIFY ) 5 MG tablet  Anxiety - Plan: ARIPiprazole  (ABILIFY ) 5 MG tablet   Patient is 49 year old African-American female with bipolar disorder and anxiety currently stable on Abilify  5 mg.  She denies any side effects.  Her job is going well.  She has appointment coming up to see her PCP earlier next month.  Encouraged to keep that appointment.  Recommended to call back if she is any question or any concern.  Follow-up in 6 months.   Follow Up Instructions:     I discussed the assessment and treatment plan with the patient. The patient was provided an opportunity to ask questions and all  were answered. The patient agreed with the plan and demonstrated an understanding of the instructions.   The patient was advised to call back or seek an in-person evaluation if the symptoms worsen or if the condition fails to improve as anticipated.    Collaboration of Care: Other provider involved in patient's care AEB notes are available in epic to review  Patient/Guardian was advised Release of Information must be obtained prior to any record release in order to collaborate their care with an outside provider. Patient/Guardian was advised if they have not already done so to contact the registration department to sign all necessary forms in order for us  to  release information regarding their care.   Consent: Patient/Guardian gives verbal consent for treatment and assignment of benefits for services provided during this visit. Patient/Guardian expressed understanding and agreed to proceed.     Total encounter time 18 minutes which includes face-to-face time, chart reviewed, care coordination, order entry and documentation during this encounter.   Note: This document was prepared by [employer] voice dictation technology and any errors that results from this process are unintentional.    Leni ONEIDA Client, MD 08/30/2024   "

## 2024-09-04 ENCOUNTER — Ambulatory Visit: Payer: PRIVATE HEALTH INSURANCE | Admitting: Psychology

## 2024-09-04 DIAGNOSIS — F319 Bipolar disorder, unspecified: Secondary | ICD-10-CM | POA: Diagnosis not present

## 2024-09-04 NOTE — Progress Notes (Signed)
 "     [facility] Counselor/Therapist Progress Note  Patient ID: Christina [name], MRN: [id number],    Date: 09/04/2024  Time Spent: 2:33pm-3:13pm  Treatment Type: Individual Therapy  Pt is seen for a virtual video visit via caregility.  Pt consents to telehealth session and is aware of limitations of telehealth visits. pt joins from her home, reporting privacy, and counselor from her home office.  Reported Symptoms:  Pt reports some worry and anxiety- but focus on family/work balance and self care.  Mental Status Exam: Appearance:  Well Groomed     Behavior: Appropriate  Motor: Normal  Speech/Language:  Normal Rate  Affect: Appropriate  Mood: anxious  Thought process: normal  Thought content:   WNL  Sensory/Perceptual disturbances:   WNL  Orientation: oriented to person, place, time/date, and situation  Attention: Good  Concentration: Good  Memory: WNL  Fund of knowledge:  Good  Insight:   Good  Judgment:  Good  Impulse Control: Good   Risk Assessment: Danger to Self:  No Self-injurious Behavior: No Danger to Others: No Duty to Warn:no Physical Aggression / Violence:No   Subjective: Counselor assessed pt current functioning per pt report.  Processed w/pt positives,stressors and emotions. Assisted pt w/ recognizing self care and not overextending.  Explored w/ pt her direction and meeting her goals for family, professionally and community.   pt affect wnl.  Pt reports she has had some challenges w/ having time off for work w/ various family needs, illness and snow days.  Pt reflected on how she is recognizing focus for her self care and balance w/ caring for family.  Pt discussed wants to build community resources for autism spectrum and finding direction for self, her family and professionally.  Pt discussed options considering and aware to pace herself as explores and takes steps towards his goal.    Interventions: Cognitive Behavioral Therapy and boundaries and  supportive  Diagnosis:Bipolar I disorder (HCC)  Plan: Pt to f/u w/ counseling in 1 week.   Pt to f/u w/ Dr. Arfeen as scheduled.     Treatment Plan 02/17/24   Client Abilities/Strengths  supports: family- partner and mom, friends  positives: self awareness, expressing feelings, advocating for self. Motivated, hardworking, intelligent, family centered   Client Treatment Preferences  Weekly to biweekly counseling and continue medication management Dr. Arfeen.    Client Statement of Needs  pt  Need to continue w/ my therapy and compliance w/ my medication management for mood stability.  to continue to support me- give my safe place continue talk about what I need. I want to recognize my capacity and boundaries.  I want to articulate saying no when needed.  More comforting activities in upcoming year for self.  .    Treatment Level  outpatient counseling    Symptoms  death of father 08-Apr-2021.  Husband dx w/ heart failure 11/2021. Grief and anxiety. History of at least one hypomanic, manic, or mixed mood episode.SABRA History of chronic or recurrent depression for which the client has taken antidepressant medication and had outpatient treatment,     Goals 1. Continue a healthy grieving process around the losses experienced.   Objective Identify and verbalize feelings associated with the loss. Target Date: 2025-02-16 Frequency: Daily  Progress: 75 Modality: individual  Related Interventions Assist the client in identifying and expressing feelings connected with her loss.   2. Increase the use of self care, grounding and calming skills to assist coping w/ stressors and maintaining mood  Objective Maintain positive self care, use of mindfulness and other grounding activities to maintain mood stability. Target Date: 2025-02-16 Frequency: Daily  Progress: 25 Modality: individual  Related Interventions Assist the client in establishing a routine pattern of daily activities such as sleeping,  eating, solitary and social activities, and exercise; use of relaxation and grounding strategies routine and assist in managing stressors.    3. Recognize capacity for engaging/assisting in responsibilities, develop healthy boundaries for self and say no when doesn't have the capacity.     Objective Identify values, boundaries for work life balance and priorities responsibilities, set boundaries with self and others and say no to requests that compromise boundaries/capacity.   Target Date: 2025-02-16 Frequency: Daily  Progress: 0 Modality: individual  Related Interventions utilize CBT strategies to assist in challenging negative thought patterns and beliefs and ACT strategies to assist in actions, decisions and interactions consistent w/values.   This plan has been reviewed and created by the following participants:  This plan will be reviewed at least every 12 months. Date Behavioral Health Clinician Date Guardian/Patient   02/17/24 [facility] Christina [name] [facility]       02/17/24 Verbal Consent Provided and electronic signature obtained.                  [name], [facility]               Christina [name], [facility] "

## 2024-09-07 ENCOUNTER — Ambulatory Visit: Payer: PRIVATE HEALTH INSURANCE | Admitting: Psychology

## 2024-09-18 ENCOUNTER — Ambulatory Visit: Payer: PRIVATE HEALTH INSURANCE | Admitting: Psychology

## 2024-09-19 ENCOUNTER — Ambulatory Visit: Payer: PRIVATE HEALTH INSURANCE | Admitting: Family Medicine

## 2024-09-28 ENCOUNTER — Ambulatory Visit: Payer: PRIVATE HEALTH INSURANCE | Admitting: Psychology

## 2025-02-27 ENCOUNTER — Telehealth (HOSPITAL_COMMUNITY): Payer: PRIVATE HEALTH INSURANCE | Admitting: Psychiatry
